# Patient Record
Sex: Male | Born: 1940 | Race: White | Hispanic: No | Marital: Married | Smoking: Former smoker
Health system: Southern US, Community
[De-identification: ages and names within clinical notes are randomized; demographics above are authoritative.]

## PROBLEM LIST (undated history)

## (undated) DIAGNOSIS — M1990 Unspecified osteoarthritis, unspecified site: Secondary | ICD-10-CM

## (undated) DIAGNOSIS — I219 Acute myocardial infarction, unspecified: Secondary | ICD-10-CM

## (undated) DIAGNOSIS — M549 Dorsalgia, unspecified: Secondary | ICD-10-CM

## (undated) DIAGNOSIS — R233 Spontaneous ecchymoses: Secondary | ICD-10-CM

## (undated) DIAGNOSIS — IMO0001 Reserved for inherently not codable concepts without codable children: Secondary | ICD-10-CM

## (undated) DIAGNOSIS — C801 Malignant (primary) neoplasm, unspecified: Secondary | ICD-10-CM

## (undated) DIAGNOSIS — K219 Gastro-esophageal reflux disease without esophagitis: Secondary | ICD-10-CM

## (undated) DIAGNOSIS — Z5189 Encounter for other specified aftercare: Secondary | ICD-10-CM

## (undated) DIAGNOSIS — G8929 Other chronic pain: Secondary | ICD-10-CM

## (undated) DIAGNOSIS — H3530 Unspecified macular degeneration: Secondary | ICD-10-CM

## (undated) DIAGNOSIS — R238 Other skin changes: Secondary | ICD-10-CM

## (undated) DIAGNOSIS — H9190 Unspecified hearing loss, unspecified ear: Secondary | ICD-10-CM

## (undated) DIAGNOSIS — J189 Pneumonia, unspecified organism: Secondary | ICD-10-CM

## (undated) DIAGNOSIS — I2510 Atherosclerotic heart disease of native coronary artery without angina pectoris: Secondary | ICD-10-CM

## (undated) HISTORY — PX: CATARACT EXTRACTION: SUR2

## (undated) HISTORY — PX: JOINT REPLACEMENT: SHX530

## (undated) HISTORY — PX: EYE SURGERY: SHX253

## (undated) HISTORY — PX: TONSILLECTOMY: SUR1361

## (undated) HISTORY — PX: BACK SURGERY: SHX140

## (undated) HISTORY — PX: COLONOSCOPY: SHX174

---

## 1998-01-09 DIAGNOSIS — J189 Pneumonia, unspecified organism: Secondary | ICD-10-CM

## 1998-01-09 HISTORY — DX: Pneumonia, unspecified organism: J18.9

## 1999-01-10 DIAGNOSIS — IMO0001 Reserved for inherently not codable concepts without codable children: Secondary | ICD-10-CM

## 1999-01-10 DIAGNOSIS — Z5189 Encounter for other specified aftercare: Secondary | ICD-10-CM

## 1999-01-10 DIAGNOSIS — I219 Acute myocardial infarction, unspecified: Secondary | ICD-10-CM

## 1999-01-10 HISTORY — DX: Reserved for inherently not codable concepts without codable children: IMO0001

## 1999-01-10 HISTORY — PX: CARDIAC CATHETERIZATION: SHX172

## 1999-01-10 HISTORY — DX: Acute myocardial infarction, unspecified: I21.9

## 1999-01-10 HISTORY — PX: CORONARY ARTERY BYPASS GRAFT: SHX141

## 1999-01-10 HISTORY — DX: Encounter for other specified aftercare: Z51.89

## 2000-04-17 ENCOUNTER — Inpatient Hospital Stay (HOSPITAL_COMMUNITY): Admission: EM | Admit: 2000-04-17 | Discharge: 2000-04-23 | Payer: Self-pay

## 2000-04-18 ENCOUNTER — Encounter: Payer: Self-pay | Admitting: Thoracic Surgery (Cardiothoracic Vascular Surgery)

## 2000-04-19 ENCOUNTER — Encounter: Payer: Self-pay | Admitting: Thoracic Surgery (Cardiothoracic Vascular Surgery)

## 2000-04-20 ENCOUNTER — Encounter: Payer: Self-pay | Admitting: Thoracic Surgery (Cardiothoracic Vascular Surgery)

## 2000-04-21 ENCOUNTER — Encounter: Payer: Self-pay | Admitting: Thoracic Surgery (Cardiothoracic Vascular Surgery)

## 2004-01-25 ENCOUNTER — Inpatient Hospital Stay: Payer: Self-pay | Admitting: Otolaryngology

## 2005-03-30 ENCOUNTER — Other Ambulatory Visit: Payer: Self-pay

## 2005-03-30 ENCOUNTER — Inpatient Hospital Stay: Payer: Self-pay

## 2006-01-26 ENCOUNTER — Ambulatory Visit: Payer: Self-pay | Admitting: General Surgery

## 2009-10-13 ENCOUNTER — Ambulatory Visit: Payer: Self-pay | Admitting: Ophthalmology

## 2009-12-08 ENCOUNTER — Ambulatory Visit: Payer: Self-pay | Admitting: Ophthalmology

## 2010-05-05 ENCOUNTER — Ambulatory Visit: Payer: Self-pay | Admitting: Internal Medicine

## 2010-11-17 ENCOUNTER — Ambulatory Visit: Payer: Self-pay | Admitting: Neurosurgery

## 2010-12-14 ENCOUNTER — Encounter (HOSPITAL_COMMUNITY): Payer: Self-pay | Admitting: Pharmacy Technician

## 2010-12-15 ENCOUNTER — Encounter (HOSPITAL_COMMUNITY): Payer: Self-pay

## 2010-12-15 ENCOUNTER — Encounter (HOSPITAL_COMMUNITY)
Admission: RE | Admit: 2010-12-15 | Discharge: 2010-12-15 | Disposition: A | Discharge status: Home / Self Care | Payer: Medicare Other | Source: Ambulatory Visit | Attending: Neurosurgery | Admitting: Neurosurgery

## 2010-12-15 HISTORY — DX: Pneumonia, unspecified organism: J18.9

## 2010-12-15 HISTORY — DX: Spontaneous ecchymoses: R23.3

## 2010-12-15 HISTORY — DX: Encounter for other specified aftercare: Z51.89

## 2010-12-15 HISTORY — DX: Acute myocardial infarction, unspecified: I21.9

## 2010-12-15 HISTORY — DX: Gastro-esophageal reflux disease without esophagitis: K21.9

## 2010-12-15 HISTORY — DX: Other chronic pain: G89.29

## 2010-12-15 HISTORY — DX: Dorsalgia, unspecified: M54.9

## 2010-12-15 HISTORY — DX: Unspecified osteoarthritis, unspecified site: M19.90

## 2010-12-15 HISTORY — DX: Other skin changes: R23.8

## 2010-12-15 HISTORY — DX: Unspecified macular degeneration: H35.30

## 2010-12-15 HISTORY — DX: Unspecified hearing loss, unspecified ear: H91.90

## 2010-12-15 HISTORY — DX: Atherosclerotic heart disease of native coronary artery without angina pectoris: I25.10

## 2010-12-15 HISTORY — DX: Reserved for inherently not codable concepts without codable children: IMO0001

## 2010-12-15 LAB — MRSA PCR SCREENING: MRSA by PCR: NEGATIVE

## 2010-12-15 LAB — CBC
Hemoglobin: 13.5 g/dL (ref 13.0–17.0)
MCH: 29.5 pg (ref 26.0–34.0)
RBC: 4.57 MIL/uL (ref 4.22–5.81)
WBC: 7.1 10*3/uL (ref 4.0–10.5)

## 2010-12-15 LAB — BASIC METABOLIC PANEL
GFR calc Af Amer: 70 mL/min — ABNORMAL LOW (ref >90)
GFR calc non Af Amer: 60 mL/min — ABNORMAL LOW (ref >90)
Glucose, Bld: 145 mg/dL — ABNORMAL HIGH (ref 70–99)
Potassium: 4.8 mEq/L (ref 3.5–5.1)
Sodium: 138 mEq/L (ref 135–145)

## 2010-12-15 NOTE — Progress Notes (Signed)
Pt is on Simvastatin and Metoprolol as maintanence  Only  Cardiologist is Dr.Paraschos and a clearance note is in chart;echo,stress test,ekg done-to request this

## 2010-12-15 NOTE — Pre-Procedure Instructions (Signed)
[address]  12/15/2010   Your procedure is scheduled on: Mon,Dec 10 @ 0730  Report to [facility] at 0530 AM.  Call this number if you have problems the morning of surgery: [phone]   Remember:   Do not eat food:After Midnight.  May have clear liquids: up to 4 Hours before arrival.(until 1:30am)  Clear liquids include soda, tea, black coffee, apple or grape juice, broth.  Take these medicines the morning of surgery with A SIP OF WATER: Metoprolol,Omeprazole,Pain Pill(if needed),Tramadol   Do not wear jewelry, make-up or nail polish.  Do not wear lotions, powders, or perfumes. You may wear deodorant.  Do not shave 48 hours prior to surgery.  Do not bring valuables to the hospital.  Contacts, dentures or bridgework may not be worn into surgery.  Leave suitcase in the car. After surgery it may be brought to your room.  For patients admitted to the hospital, checkout time is 11:00 AM the day of discharge.   Patients discharged the day of surgery will not be allowed to drive home.  Name and phone number of your driver:   Special Instructions: CHG Shower Use Special Wash: 1/2 bottle night before surgery and 1/2 bottle morning of surgery.   Please read over the following fact sheets that you were given: Pain Booklet, Coughing and Deep Breathing, MRSA Information and Surgical Site Infection Prevention

## 2010-12-19 ENCOUNTER — Inpatient Hospital Stay (HOSPITAL_COMMUNITY): Payer: Medicare Other

## 2010-12-19 ENCOUNTER — Encounter (HOSPITAL_COMMUNITY)
Admission: RE | Disposition: A | Discharge status: Home / Self Care | Payer: Self-pay | Source: Ambulatory Visit | Attending: Neurosurgery

## 2010-12-19 ENCOUNTER — Inpatient Hospital Stay (HOSPITAL_COMMUNITY): Payer: Medicare Other | Admitting: Anesthesiology

## 2010-12-19 ENCOUNTER — Encounter (HOSPITAL_COMMUNITY): Payer: Self-pay | Admitting: Anesthesiology

## 2010-12-19 ENCOUNTER — Encounter (HOSPITAL_COMMUNITY): Payer: Self-pay | Admitting: *Deleted

## 2010-12-19 ENCOUNTER — Inpatient Hospital Stay (HOSPITAL_COMMUNITY)
Admission: RE | Admit: 2010-12-19 | Discharge: 2010-12-20 | DRG: 491 | Disposition: A | Discharge status: Home / Self Care | Payer: Medicare Other | Source: Ambulatory Visit | Attending: Neurosurgery | Admitting: Neurosurgery

## 2010-12-19 DIAGNOSIS — I2510 Atherosclerotic heart disease of native coronary artery without angina pectoris: Secondary | ICD-10-CM | POA: Diagnosis present

## 2010-12-19 DIAGNOSIS — I252 Old myocardial infarction: Secondary | ICD-10-CM

## 2010-12-19 DIAGNOSIS — E119 Type 2 diabetes mellitus without complications: Secondary | ICD-10-CM | POA: Diagnosis present

## 2010-12-19 DIAGNOSIS — M48062 Spinal stenosis, lumbar region with neurogenic claudication: Principal | ICD-10-CM | POA: Diagnosis present

## 2010-12-19 DIAGNOSIS — M5126 Other intervertebral disc displacement, lumbar region: Secondary | ICD-10-CM | POA: Diagnosis present

## 2010-12-19 DIAGNOSIS — K219 Gastro-esophageal reflux disease without esophagitis: Secondary | ICD-10-CM | POA: Diagnosis present

## 2010-12-19 HISTORY — PX: LUMBAR LAMINECTOMY/DECOMPRESSION MICRODISCECTOMY: SHX5026

## 2010-12-19 LAB — GLUCOSE, CAPILLARY
Glucose-Capillary: 132 mg/dL — ABNORMAL HIGH (ref 70–99)
Glucose-Capillary: 128 mg/dL — ABNORMAL HIGH (ref 70–99)
Glucose-Capillary: 180 mg/dL — ABNORMAL HIGH (ref 70–99)

## 2010-12-19 SURGERY — LUMBAR LAMINECTOMY/DECOMPRESSION MICRODISCECTOMY
Anesthesia: General | Site: Back | Wound class: Clean

## 2010-12-19 MED ORDER — SODIUM CHLORIDE 0.9 % IR SOLN
Status: DC | PRN
  Administered 2010-12-19: 1000 mL

## 2010-12-19 MED ORDER — LIDOCAINE-EPINEPHRINE 1 %-1:100000 IJ SOLN
INTRAMUSCULAR | Status: DC | PRN
  Administered 2010-12-19: 20 mL

## 2010-12-19 MED ORDER — NAPROXEN 500 MG PO TABS
500.0000 mg | ORAL_TABLET | Freq: Two times a day (BID) | ORAL | Status: DC
  Administered 2010-12-19 – 2010-12-20 (×2): 500 mg via ORAL
  Filled 2010-12-19 (×5): qty 1

## 2010-12-19 MED ORDER — ACETAMINOPHEN 325 MG PO TABS: 650.0000 mg | ORAL_TABLET | ORAL | Status: DC | PRN

## 2010-12-19 MED ORDER — MENTHOL 3 MG MT LOZG: 1.0000 | LOZENGE | OROMUCOSAL | Status: DC | PRN

## 2010-12-19 MED ORDER — SODIUM CHLORIDE 0.9 % IR SOLN
Status: DC | PRN
  Administered 2010-12-19: 08:00:00

## 2010-12-19 MED ORDER — MAGNESIUM OXIDE 400 MG PO TABS: 400.0000 mg | ORAL_TABLET | Freq: Every day | ORAL | Status: DC

## 2010-12-19 MED ORDER — LIDOCAINE HCL (CARDIAC) 20 MG/ML IV SOLN
INTRAVENOUS | Status: DC | PRN
  Administered 2010-12-19: 60 mg via INTRAVENOUS

## 2010-12-19 MED ORDER — ONDANSETRON HCL 4 MG/2ML IJ SOLN
INTRAMUSCULAR | Status: DC | PRN
  Administered 2010-12-19: 4 mg via INTRAVENOUS

## 2010-12-19 MED ORDER — CEFAZOLIN SODIUM 1-5 GM-% IV SOLN
INTRAVENOUS | Status: DC | PRN
  Administered 2010-12-19: 2 g via INTRAVENOUS

## 2010-12-19 MED ORDER — PRESERVISION AREDS 2 PO CAPS: 2.0000 | ORAL_CAPSULE | Freq: Every day | ORAL | Status: DC

## 2010-12-19 MED ORDER — INSULIN ASPART 100 UNIT/ML ~~LOC~~ SOLN: 0.0000 [IU] | Freq: Every day | SUBCUTANEOUS | Status: DC

## 2010-12-19 MED ORDER — METOPROLOL TARTRATE 12.5 MG HALF TABLET
12.5000 mg | ORAL_TABLET | Freq: Two times a day (BID) | ORAL | Status: DC
Start: 2010-12-19 — End: 2010-12-20
  Administered 2010-12-19 – 2010-12-20 (×2): 12.5 mg via ORAL
  Filled 2010-12-19 (×3): qty 1

## 2010-12-19 MED ORDER — ASPIRIN EC 81 MG PO TBEC
81.0000 mg | DELAYED_RELEASE_TABLET | Freq: Every day | ORAL | Status: DC
  Administered 2010-12-19: 81 mg via ORAL
  Filled 2010-12-19 (×2): qty 1

## 2010-12-19 MED ORDER — ROCURONIUM BROMIDE 100 MG/10ML IV SOLN
INTRAVENOUS | Status: DC | PRN
  Administered 2010-12-19: 50 mg via INTRAVENOUS
  Administered 2010-12-19: 30 mg via INTRAVENOUS

## 2010-12-19 MED ORDER — PANTOPRAZOLE SODIUM 40 MG IV SOLR: 40.0000 mg | Freq: Every day | INTRAVENOUS | Status: DC

## 2010-12-19 MED ORDER — SIMVASTATIN 40 MG PO TABS
40.0000 mg | ORAL_TABLET | Freq: Every day | ORAL | Status: DC
  Administered 2010-12-19: 40 mg via ORAL
  Filled 2010-12-19 (×2): qty 1

## 2010-12-19 MED ORDER — INSULIN ASPART 100 UNIT/ML ~~LOC~~ SOLN
0.0000 [IU] | Freq: Three times a day (TID) | SUBCUTANEOUS | Status: DC
  Administered 2010-12-20: 2 [IU] via SUBCUTANEOUS
  Filled 2010-12-19: qty 3

## 2010-12-19 MED ORDER — PANTOPRAZOLE SODIUM 40 MG PO TBEC
40.0000 mg | DELAYED_RELEASE_TABLET | Freq: Every day | ORAL | Status: DC
  Administered 2010-12-20: 40 mg via ORAL
  Filled 2010-12-19: qty 1

## 2010-12-19 MED ORDER — HYDROMORPHONE HCL PF 1 MG/ML IJ SOLN: 0.2500 mg | INTRAMUSCULAR | Status: DC | PRN

## 2010-12-19 MED ORDER — TRAMADOL HCL 50 MG PO TABS
50.0000 mg | ORAL_TABLET | Freq: Four times a day (QID) | ORAL | Status: DC | PRN
  Filled 2010-12-19: qty 1

## 2010-12-19 MED ORDER — BUPIVACAINE HCL (PF) 0.25 % IJ SOLN
INTRAMUSCULAR | Status: DC | PRN
  Administered 2010-12-19: 50 mL

## 2010-12-19 MED ORDER — ONDANSETRON HCL 4 MG/2ML IJ SOLN: 4.0000 mg | INTRAMUSCULAR | Status: DC | PRN

## 2010-12-19 MED ORDER — PHENYLEPHRINE HCL 10 MG/ML IJ SOLN
INTRAMUSCULAR | Status: DC | PRN
  Administered 2010-12-19 (×3): 80 ug via INTRAVENOUS

## 2010-12-19 MED ORDER — PROPOFOL 10 MG/ML IV EMUL
INTRAVENOUS | Status: DC | PRN
  Administered 2010-12-19: 180 mg via INTRAVENOUS

## 2010-12-19 MED ORDER — HYDROMORPHONE HCL PF 1 MG/ML IJ SOLN
0.5000 mg | INTRAMUSCULAR | Status: DC | PRN
  Administered 2010-12-19: 1 mg via INTRAVENOUS
  Filled 2010-12-19: qty 1

## 2010-12-19 MED ORDER — CYCLOBENZAPRINE HCL 10 MG PO TABS
10.0000 mg | ORAL_TABLET | Freq: Three times a day (TID) | ORAL | Status: DC | PRN
  Administered 2010-12-19: 10 mg via ORAL
  Filled 2010-12-19: qty 1

## 2010-12-19 MED ORDER — OXYCODONE-ACETAMINOPHEN 5-325 MG PO TABS
1.0000 | ORAL_TABLET | Freq: Four times a day (QID) | ORAL | Status: DC | PRN
  Administered 2010-12-19 – 2010-12-20 (×2): 1 via ORAL
  Filled 2010-12-19 (×2): qty 1

## 2010-12-19 MED ORDER — LACTATED RINGERS IV SOLN
INTRAVENOUS | Status: DC | PRN
  Administered 2010-12-19 (×2): via INTRAVENOUS

## 2010-12-19 MED ORDER — OMEGA-3 FATTY ACIDS 1000 MG PO CAPS
1.0000 g | ORAL_CAPSULE | Freq: Every day | ORAL | Status: DC
  Administered 2010-12-20: 1 g via ORAL
  Filled 2010-12-19: qty 1

## 2010-12-19 MED ORDER — EPHEDRINE SULFATE 50 MG/ML IJ SOLN
INTRAMUSCULAR | Status: DC | PRN
  Administered 2010-12-19 (×2): 10 mg via INTRAVENOUS

## 2010-12-19 MED ORDER — PROSIGHT PO TABS
1.0000 | ORAL_TABLET | Freq: Two times a day (BID) | ORAL | Status: DC
  Administered 2010-12-19 – 2010-12-20 (×2): 1 via ORAL
  Filled 2010-12-19 (×3): qty 1

## 2010-12-19 MED ORDER — PHENOL 1.4 % MT LIQD: 1.0000 | OROMUCOSAL | Status: DC | PRN

## 2010-12-19 MED ORDER — FENTANYL CITRATE 0.05 MG/ML IJ SOLN
INTRAMUSCULAR | Status: DC | PRN
  Administered 2010-12-19: 75 ug via INTRAVENOUS
  Administered 2010-12-19: 100 ug via INTRAVENOUS

## 2010-12-19 MED ORDER — HEMOSTATIC AGENTS (NO CHARGE) OPTIME
TOPICAL | Status: DC | PRN
  Administered 2010-12-19: 1 via TOPICAL

## 2010-12-19 MED ORDER — PROSIGHT PO TABS: 2.0000 | ORAL_TABLET | Freq: Every day | ORAL | Status: DC

## 2010-12-19 MED ORDER — GLYCOPYRROLATE 0.2 MG/ML IJ SOLN
INTRAMUSCULAR | Status: DC | PRN
  Administered 2010-12-19: .8 mg via INTRAVENOUS

## 2010-12-19 MED ORDER — ONDANSETRON HCL 4 MG/2ML IJ SOLN: 4.0000 mg | Freq: Four times a day (QID) | INTRAMUSCULAR | Status: DC | PRN

## 2010-12-19 MED ORDER — CEFAZOLIN SODIUM 1-5 GM-% IV SOLN
1.0000 g | Freq: Three times a day (TID) | INTRAVENOUS | Status: AC
  Administered 2010-12-19 (×2): 1 g via INTRAVENOUS
  Filled 2010-12-19 (×2): qty 50

## 2010-12-19 MED ORDER — METFORMIN HCL 500 MG PO TABS
500.0000 mg | ORAL_TABLET | Freq: Every day | ORAL | Status: DC
  Administered 2010-12-20: 500 mg via ORAL
  Filled 2010-12-19 (×2): qty 1

## 2010-12-19 MED ORDER — ASPIRIN EC 81 MG PO TBEC: 81.0000 mg | DELAYED_RELEASE_TABLET | Freq: Every day | ORAL | Status: DC

## 2010-12-19 MED ORDER — MAGNESIUM OXIDE 400 MG PO TABS
400.0000 mg | ORAL_TABLET | Freq: Every day | ORAL | Status: DC
  Administered 2010-12-19: 400 mg via ORAL
  Filled 2010-12-19 (×2): qty 1

## 2010-12-19 MED ORDER — ACETAMINOPHEN 650 MG RE SUPP: 650.0000 mg | RECTAL | Status: DC | PRN

## 2010-12-19 MED ORDER — SODIUM CHLORIDE 0.9 % IJ SOLN
3.0000 mL | Freq: Two times a day (BID) | INTRAMUSCULAR | Status: DC
  Administered 2010-12-19 (×2): 3 mL via INTRAVENOUS

## 2010-12-19 MED ORDER — NEOSTIGMINE METHYLSULFATE 1 MG/ML IJ SOLN
INTRAMUSCULAR | Status: DC | PRN
  Administered 2010-12-19: 5 mg via INTRAVENOUS

## 2010-12-19 MED ORDER — THROMBIN 5000 UNITS EX KIT
PACK | CUTANEOUS | Status: DC | PRN
  Administered 2010-12-19 (×2): 5000 [IU] via TOPICAL

## 2010-12-19 SURGICAL SUPPLY — 57 items
BAG DECANTER FOR FLEXI CONT (MISCELLANEOUS) ×2 IMPLANT
BENZOIN TINCTURE PRP APPL 2/3 (GAUZE/BANDAGES/DRESSINGS) ×2 IMPLANT
BLADE SURG 11 STRL SS (BLADE) ×2 IMPLANT
BLADE SURG ROTATE 9660 (MISCELLANEOUS) IMPLANT
BRUSH SCRUB EZ PLAIN DRY (MISCELLANEOUS) ×2 IMPLANT
BUR MATCHSTICK NEURO 3.0 LAGG (BURR) ×2 IMPLANT
BUR PRECISION FLUTE 6.0 (BURR) ×2 IMPLANT
CANISTER SUCTION 2500CC (MISCELLANEOUS) ×2 IMPLANT
CLOTH BEACON ORANGE TIMEOUT ST (SAFETY) ×2 IMPLANT
CONT SPEC 4OZ CLIKSEAL STRL BL (MISCELLANEOUS) ×4 IMPLANT
CORDS BIPOLAR (ELECTRODE) ×2 IMPLANT
DECANTER SPIKE VIAL GLASS SM (MISCELLANEOUS) IMPLANT
DERMABOND ADVANCED (GAUZE/BANDAGES/DRESSINGS) ×1
DERMABOND ADVANCED .7 DNX12 (GAUZE/BANDAGES/DRESSINGS) ×1 IMPLANT
DRAPE LAPAROTOMY 100X72X124 (DRAPES) ×2 IMPLANT
DRAPE MICROSCOPE ZEISS OPMI (DRAPES) ×2 IMPLANT
DRAPE POUCH INSTRU U-SHP 10X18 (DRAPES) ×2 IMPLANT
DRAPE PROXIMA HALF (DRAPES) IMPLANT
DRAPE SURG 17X23 STRL (DRAPES) ×2 IMPLANT
DRSG OPSITE 4X5.5 SM (GAUZE/BANDAGES/DRESSINGS) ×2 IMPLANT
ELECT REM PT RETURN 9FT ADLT (ELECTROSURGICAL) ×2
ELECTRODE REM PT RTRN 9FT ADLT (ELECTROSURGICAL) ×1 IMPLANT
EVACUATOR 1/8 PVC DRAIN (DRAIN) ×2 IMPLANT
GAUZE SPONGE 4X4 16PLY XRAY LF (GAUZE/BANDAGES/DRESSINGS) IMPLANT
GLOVE BIO SURGEON STRL SZ8 (GLOVE) ×2 IMPLANT
GLOVE BIOGEL PI IND STRL 7.0 (GLOVE) ×1 IMPLANT
GLOVE BIOGEL PI INDICATOR 7.0 (GLOVE) ×1
GLOVE ECLIPSE 7.5 STRL STRAW (GLOVE) IMPLANT
GLOVE ECLIPSE 8.5 STRL (GLOVE) ×2 IMPLANT
GLOVE EXAM NITRILE LRG STRL (GLOVE) ×2 IMPLANT
GLOVE EXAM NITRILE MD LF STRL (GLOVE) IMPLANT
GLOVE EXAM NITRILE XL STR (GLOVE) IMPLANT
GLOVE EXAM NITRILE XS STR PU (GLOVE) IMPLANT
GLOVE INDICATOR 8.5 STRL (GLOVE) ×2 IMPLANT
GLOVE SURG SS PI 6.5 STRL IVOR (GLOVE) ×4 IMPLANT
GOWN BRE IMP SLV AUR LG STRL (GOWN DISPOSABLE) ×2 IMPLANT
GOWN BRE IMP SLV AUR XL STRL (GOWN DISPOSABLE) ×4 IMPLANT
GOWN STRL REIN 2XL LVL4 (GOWN DISPOSABLE) IMPLANT
KIT BASIN OR (CUSTOM PROCEDURE TRAY) ×2 IMPLANT
KIT ROOM TURNOVER OR (KITS) ×2 IMPLANT
NEEDLE HYPO 22GX1.5 SAFETY (NEEDLE) ×2 IMPLANT
NEEDLE SPNL 22GX3.5 QUINCKE BK (NEEDLE) ×2 IMPLANT
NS IRRIG 1000ML POUR BTL (IV SOLUTION) ×2 IMPLANT
PACK LAMINECTOMY NEURO (CUSTOM PROCEDURE TRAY) ×2 IMPLANT
RUBBERBAND STERILE (MISCELLANEOUS) ×4 IMPLANT
SPONGE GAUZE 4X4 12PLY (GAUZE/BANDAGES/DRESSINGS) ×2 IMPLANT
SPONGE SURGIFOAM ABS GEL SZ50 (HEMOSTASIS) ×2 IMPLANT
STRIP CLOSURE SKIN 1/2X4 (GAUZE/BANDAGES/DRESSINGS) ×2 IMPLANT
SUT VIC AB 0 CT1 18XCR BRD8 (SUTURE) ×1 IMPLANT
SUT VIC AB 0 CT1 8-18 (SUTURE) ×1
SUT VIC AB 2-0 CT1 18 (SUTURE) ×2 IMPLANT
SUT VICRYL 4-0 PS2 18IN ABS (SUTURE) ×2 IMPLANT
SYR 20ML ECCENTRIC (SYRINGE) ×2 IMPLANT
TAPE CLOTH SURG 4X10 WHT LF (GAUZE/BANDAGES/DRESSINGS) ×2 IMPLANT
TOWEL OR 17X24 6PK STRL BLUE (TOWEL DISPOSABLE) ×2 IMPLANT
TOWEL OR 17X26 10 PK STRL BLUE (TOWEL DISPOSABLE) ×2 IMPLANT
WATER STERILE IRR 1000ML POUR (IV SOLUTION) ×2 IMPLANT

## 2010-12-19 NOTE — Anesthesia Preprocedure Evaluation (Addendum)
Anesthesia Evaluation  Patient identified by MRN, date of birth, ID band Patient awake    Reviewed: Allergy & Precautions, H&P , NPO status , Patient's Chart, lab work & pertinent test results  Airway   Neck ROM: full    Dental  (+) Edentulous Upper   Pulmonary  clear to auscultation        Cardiovascular + CAD and + Past MI     Neuro/Psych    GI/Hepatic GERD-  ,  Endo/Other  Diabetes mellitus-  Renal/GU      Musculoskeletal   Abdominal   Peds  Hematology   Anesthesia Other Findings   Reproductive/Obstetrics                          Anesthesia Physical Anesthesia Plan  ASA: III  Anesthesia Plan: General   Post-op Pain Management:    Induction: Intravenous  Airway Management Planned: Oral ETT  Additional Equipment:   Intra-op Plan:   Post-operative Plan:   Informed Consent: I have reviewed the patients History and Physical, chart, labs and discussed the procedure including the risks, benefits and alternatives for the proposed anesthesia with the patient or authorized representative who has indicated his/her understanding and acceptance.     Plan Discussed with: CRNA and Surgeon  Anesthesia Plan Comments:         Anesthesia Quick Evaluation

## 2010-12-19 NOTE — Transfer of Care (Signed)
Immediate Anesthesia Transfer of Care Note  Patient: Ivan Beasley  Procedure(s) Performed:  LUMBAR LAMINECTOMY/DECOMPRESSION MICRODISCECTOMY - Lumbar three-four, four-five decompressive lumbar laminectomy  Patient Location: PACU  Anesthesia Type: General  Level of Consciousness: awake, alert  and oriented  Airway & Oxygen Therapy: Patient Spontanous Breathing and Patient connected to nasal cannula oxygen  Post-op Assessment: Report given to PACU RN and Post -op Vital signs reviewed and stable  Post vital signs: Reviewed and stable  Complications: No apparent anesthesia complications

## 2010-12-19 NOTE — Preoperative (Signed)
Beta Blockers   Reason not to administer Beta Blockers:Not Applicable 

## 2010-12-19 NOTE — H&P (Signed)
Ivan Beasley is an 70 y.o. male.   Chief Complaint: Back and bilateral leg pain HPI: This is a 70 year old gentleman with long-standing back and bilateral leg pain worse on the right that limits his walking. He claudication after very short distances must stop and rest before he resumes. He reports numbness and tingling that maintain that would radiate to his right buttock down the back and outside of his right thigh the front of his shin and L4-L5 nerve root pattern he is been refractory to all forms of conservative treatment to include medications, steroids, therapy and time. He was preoperatively cleared by the [facility] in his cardiologist which noted a ejection fraction of 55-60% on his last echo. Patient currently denies any bowel bladder difficulty. And no new symptoms.  Past Medical History  Diagnosis Date  . Coronary artery disease   . Myocardial infarction 2001  . Pneumonia 2000  . Arthritis   . 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 Bruises easily   . GERD (gastroesophageal reflux disease)     takes Omeprazole daily as needed for stomach pain  . Blood transfusion 2001  . Diabetes mellitus     takes Metformin daily;  . Impaired hearing     bil hearing aide  . Macular degeneration     being watched for this but hasn't been "completely" diagnosed    Past Surgical History  Procedure Date  . Coronary artery bypass graft 2001    4 vessels  . Cardiac catheterization 2001  . Tonsillectomy     as a child  . Colonoscopy   . Cataract extraction     bilateral    Family History  Problem Relation Age of Onset  . Anesthesia problems Neg Hx   . Hypotension Neg Hx   . Malignant hyperthermia Neg Hx   . Pseudochol deficiency Neg Hx    Social History:  reports that he has quit smoking. He does not have any smokeless tobacco history on file. He reports that he does not drink alcohol or use illicit drugs.  Allergies: No Known Allergies  Medications Prior to  Admission  Medication Dose Route Frequency Provider Last Rate Last Dose  . HYDROmorphone (DILAUDID) injection 0.25-0.5 mg  0.25-0.5 mg Intravenous Q5 min PRN Raiford Simmonds, MD      . ondansetron [facility]) injection 4 mg  4 mg Intravenous Q6H PRN Raiford Simmonds, MD       No current outpatient prescriptions on file as of 12/19/2010.    Results for orders placed during the hospital encounter of 12/19/10 (from the past 48 hour(s))  GLUCOSE, CAPILLARY     Status: Abnormal   Collection Time   12/19/10  6:25 AM      Component Value Range Comment   Glucose-Capillary 132 (*) 70 - 99 (mg/dL)    Dg Chest 2 View  78/29/5621  *RADIOLOGY REPORT*  Clinical Data: Preop lumbar surgery.  CHEST - 2 VIEW  Comparison: None.  Findings: Postoperative changes in the mediastinum with sternotomy wires and vascular markers and surgical clips present.  Fractures of the upper for sternotomy wires noted.  Normal heart size and pulmonary vascularity.  Hyperinflation suggesting emphysema. Slight fibrosis in the lung bases.  No focal airspace consolidation.  No blunting of costophrenic angles.  No pneumothorax.  IMPRESSION: No evidence of active pulmonary disease.  Original Report Authenticated By: Marlon Pel, M.D.    Review of Systems  Constitutional: Negative.  Eyes: Negative.   Respiratory: Negative.   Cardiovascular: Negative.   Gastrointestinal: Negative.   Musculoskeletal: Positive for back pain.  Skin: Negative.   Endo/Heme/Allergies: Bruises/bleeds easily.    Blood pressure 129/76, pulse 93, temperature 98 F (36.7 C), temperature source Oral, resp. rate 18, SpO2 95.00%. Physical Exam  Constitutional: He is oriented to person, place, and time. He appears well-developed.  HENT:  Head: Normocephalic.  Eyes: Pupils are equal, round, and reactive to light.  Neck: Normal range of motion.  GI: Soft.  Neurological: He is alert and oriented to person, place, and time.       Patient is awake alert  oriented x4 cranial nerves are intact, strength in his lower extremities are 5 out of 5 in his iliopsoas, quads, hamstrings, gastrocs, anterior tibialis, EHL. Sensation grossly intact.     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 Risks and benefits of the operation were explained to the patient as well as alternatives of surgery expectations of outcome perioperative course, he understands and agrees to proceed forward.  Ivan Beasley P 12/19/2010, 7:20 AM

## 2010-12-19 NOTE — Op Note (Signed)
Preoperative diagnosis: Lumbar spinal stenosis L3-4 L4-5 with herniated nucleus pulposus L4-5 right  Postoperative diagnosis: Same  Procedure: Decompressive lumbar laminectomies bilateral at L3-4 and L4-5, microscopic discectomy L4-5 right with microdissection of the right L5 nerve root microscopic discectomy  Surgeon: Jillyn Hidden Ashtin Rosner  Assistant: Julio Sicks  Anesthesia: Gen.  EBL: 100  History of present illness: Patient is a very pleasant 70 year old gentleman who presents with neurogenic claudication from severe lumbar spinal stenosis at L3-4 and L4-5 with a herniated nucleus pulposus at L4-5 right. Patient failed all forms of conservative treatment have progressive worsening pain numbness and tingling and weakness in his legs and imaging studies that showed critical stenosis at L4-5 with a herniated disc as well as lumbar spinal stenosis L3-4. Patient was recommended a decompressive laminectomies and discectomy L4-5 in the right. Risks and benefits of the operation, expectations of outcome, alternatives to surgery, perioperative course relatively the patient and his family they understand and agree to proceed forward.  Operative procedure: Patient was brought in the or was induced under general anesthesia and positioned prone the Wilson frame and his back was prepped and draped in routine sterile fashion. After infiltration 10 cc lidocaine with epi a midline incision was made and Bovie light cautery was used to take down the subcutaneous tissues. Subperiosteal dissections care on the lamina of L45 and L3 bilaterally. Interoperative X. identify the appropriate level, at this point the spinous processes at L3 and L4 as well as part of the spinous process of 5 were removed central decompression was begun. There was marked hourglass compression of thecal sac predominantly from facet arthropathy and ligamentous hypertrophy at both L3-4 and L4-5. The plane from the leg and it was developed from the dura and  removed in piecemeal fashion with him a Kerrison punch. The central decompression been completed, marking laterally undergoing the facet complexes lateral decompression was achieved and the L4 and L5 foramen bilaterally were decompressed. After this had been achieved I bilaterally he OptiMARK substrate carotid field and the microscope illumination the disc space at L4-5 on the right it was herniated still partially contained within the annulus and after the epidural veins were quite dilated, an 11 blade was used to make the annulotomy and several are/discharge medial express. The disc spaces and cleanout pituitary rongeurs Epstein curettes several additional fragments removed the central compartment as well as lateral compartment and again the discectomy there is no further stenosis was a slack and the undersurface of the medial dural easily accepted probing by coronary.and hockey-stick. Although foramina were then reinspected with a coronary.h[employer] to confirm patency of the L3-4 and 5 neuroforamen. The wounds and copiously irrigated meticulous hemostasis was maintained Gelfoam was overlaid top of the dura a medium neck drain was placed and the wounds closed in layers with interrupted Vicryl. The skin was then closed with a running 4-0 subcuticular, benzoin and Steri-Strips were applied as well as Dermabond and the wound was dressed the patient recovered in stable condition at the end of the case all needle counts sponge counts were correct.

## 2010-12-19 NOTE — Anesthesia Postprocedure Evaluation (Signed)
Anesthesia Post Note  Patient: Ivan Beasley  Procedure(s) Performed:  LUMBAR LAMINECTOMY/DECOMPRESSION MICRODISCECTOMY - Lumbar three-four, four-five decompressive lumbar laminectomy  Anesthesia type: General  Patient location: PACU  Post pain: Pain level controlled and Adequate analgesia  Post assessment: Post-op Vital signs reviewed, Patient's Cardiovascular Status Stable, Respiratory Function Stable, Patent Airway and Pain level controlled  Last Vitals:  Filed Vitals:   12/19/10 1600  BP: 154/71  Pulse: 84  Temp: 36.5 C  Resp: 18    Post vital signs: Reviewed and stable  Level of consciousness: awake, alert  and oriented  Complications: No apparent anesthesia complications

## 2010-12-20 LAB — GLUCOSE, CAPILLARY: Glucose-Capillary: 144 mg/dL — ABNORMAL HIGH (ref 70–99)

## 2010-12-20 NOTE — Discharge Summary (Signed)
  Physician Discharge Summary  Patient ID: Ivan Beasley MRN: [id number] DOB/AGE: 07/26/1940 70 y.o.  Admit date: 12/19/2010 Discharge date: 12/20/2010  Admission Diagnoses: Lumbar spinal stenosis L3-4 L4-5 with ruptured disc L4-5  Discharge Diagnoses: Same Active Problems:  * No active hospital problems. *    Discharged Condition: good  Hospital Course: Patient was admitted as an AMA went to the operating room underwent the aforementioned procedure. Postoperatively patient did very well with recovered in the floor on the floor he is invalid well overnight with significant improvement preoperative leg pain. His incision clean and dry his voiding spontaneously and was stable to be discharged home.  Consults: Significant Diagnostic Studies: Treatments: Discharge Exam: Blood pressure 113/69, pulse 94, temperature 98.8 F (37.1 C), temperature source Oral, resp. rate 20, SpO2 93.00%. Strength is 5 out of 5 in wound is clean and dry.  Disposition: Home   Current Discharge Medication List    CONTINUE these medications which have NOT CHANGED   Details  aspirin EC 81 MG tablet Take 81 mg by mouth daily.      fish oil-omega-3 fatty acids 1000 MG capsule Take 1 g by mouth daily.      magnesium oxide (MAG-OX) 400 MG tablet Take 400 mg by mouth daily.      metFORMIN (GLUCOPHAGE) 500 MG tablet Take 500 mg by mouth daily.      metoprolol tartrate (LOPRESSOR) 25 MG tablet Take 12.5 mg by mouth 2 (two) times daily.      Multiple Vitamins-Minerals (PRESERVISION AREDS 2) CAPS Take 2 capsules by mouth daily.      naproxen (NAPROSYN) 500 MG tablet Take 500 mg by mouth 2 (two) times daily with a meal.      omeprazole (PRILOSEC) 40 MG capsule Take 40 mg by mouth daily as needed. For acid reflux      oxyCODONE-acetaminophen (PERCOCET) 5-325 MG per tablet Take 1 tablet by mouth every 6 (six) hours as needed. For pain     simvastatin (ZOCOR) 40 MG tablet Take 40 mg by mouth at bedtime.       traMADol (ULTRAM) 50 MG tablet Take 50 mg by mouth every 6 (six) hours as needed. Maximum dose= 8 tablets per day for pain        Follow-up Information    Follow up in 1 week.         Signed: Caira Poche P 12/20/2010, 8:18 AM

## 2010-12-20 NOTE — Progress Notes (Signed)
Subjective: Patient reports No pain or numbness in his legs he is walking much better  Objective: Vital signs in last 24 hours: Temp:  [97.4 F (36.3 C)-98.8 F (37.1 C)] 98.8 F (37.1 C) (12/11 0800) Pulse Rate:  [62-94] 94  (12/11 0800) Resp:  [12-20] 20  (12/11 0800) BP: (100-154)/(53-73) 113/69 mmHg (12/11 0800) SpO2:  [91 %-98 %] 93 % (12/11 0800)  Intake/Output from previous day: 12/10 0701 - 12/11 0700 In: 1980 [P.O.:480; I.V.:1500] Out: 250 [Drains:150; Blood:100] Intake/Output this shift:    Strength 5 out of 5 wound is clean and dry.  Lab Results: No results found for this basename: WBC:2,HGB:2,HCT:2,PLT:2 in the last 72 hours BMET No results found for this basename: NA:2,K:2,CL:2,CO2:2,GLUCOSE:2,BUN:2,CREATININE:2,CALCIUM:2 in the last 72 hours  Studies/Results: Dg Chest 2 View  12/19/2010  *RADIOLOGY REPORT*  Clinical Data: Preop lumbar surgery.  CHEST - 2 VIEW  Comparison: None.  Findings: Postoperative changes in the mediastinum with sternotomy wires and vascular markers and surgical clips present.  Fractures of the upper for sternotomy wires noted.  Normal heart size and pulmonary vascularity.  Hyperinflation suggesting emphysema. Slight fibrosis in the lung bases.  No focal airspace consolidation.  No blunting of costophrenic angles.  No pneumothorax.  IMPRESSION: No evidence of active pulmonary disease.  Original Report Authenticated By: Marlon Pel, M.D.   Dg Lumbar Spine 2-3 Views  12/19/2010  *RADIOLOGY REPORT*  Clinical Data: L3-4 and L4-5 laminectomy  LUMBAR SPINE - 2-3 VIEW  Comparison: None.  Findings: Image #1 reveals a needle overlying the lower portion of the L4 spinous process.  There is moderate disc degeneration at L4- 5.  Normal alignment and no fracture.  Image #2 reveals tissue spreaders posteriorly at L4-5.  Surgical instrument is located posterior to the L4 pedicle.  IMPRESSION: Surgical localization as above.  Original Report Authenticated  By: Camelia Phenes, M.D.    Assessment/Plan: Discharged home  LOS: 1 day     Ivan Beasley P 12/20/2010, 8:14 AM

## 2010-12-22 ENCOUNTER — Encounter (HOSPITAL_COMMUNITY): Payer: Self-pay | Admitting: Neurosurgery

## 2012-08-30 ENCOUNTER — Ambulatory Visit: Payer: Self-pay | Admitting: Neurosurgery

## 2012-08-30 LAB — BUN: BUN: 36 mg/dL — ABNORMAL HIGH (ref 7–18)

## 2012-08-30 LAB — CREATININE, SERUM: EGFR (African American): 48 — ABNORMAL LOW

## 2012-09-11 ENCOUNTER — Other Ambulatory Visit: Payer: Self-pay | Admitting: Neurosurgery

## 2012-09-11 DIAGNOSIS — M5126 Other intervertebral disc displacement, lumbar region: Secondary | ICD-10-CM

## 2012-09-24 ENCOUNTER — Other Ambulatory Visit: Payer: Self-pay | Admitting: Neurosurgery

## 2012-09-24 ENCOUNTER — Ambulatory Visit
Admission: RE | Admit: 2012-09-24 | Discharge: 2012-09-24 | Disposition: A | Discharge status: Home / Self Care | Payer: Medicare Other | Source: Ambulatory Visit | Attending: Neurosurgery | Admitting: Neurosurgery

## 2012-09-24 DIAGNOSIS — M5126 Other intervertebral disc displacement, lumbar region: Secondary | ICD-10-CM

## 2012-09-24 MED ORDER — METHYLPREDNISOLONE ACETATE 40 MG/ML INJ SUSP (RADIOLOG
120.0000 mg | Freq: Once | INTRAMUSCULAR | Status: AC
  Administered 2012-09-24: 120 mg via EPIDURAL

## 2012-09-24 MED ORDER — IOHEXOL 180 MG/ML  SOLN
1.0000 mL | Freq: Once | INTRAMUSCULAR | Status: AC | PRN
  Administered 2012-09-24: 1 mL via EPIDURAL

## 2013-05-07 ENCOUNTER — Ambulatory Visit: Payer: Self-pay | Admitting: General Practice

## 2013-05-07 LAB — URINALYSIS, COMPLETE
BLOOD: NEGATIVE
Bacteria: NONE SEEN
Bilirubin,UR: NEGATIVE
Glucose,UR: 50 mg/dL (ref 0–75)
Ketone: NEGATIVE
LEUKOCYTE ESTERASE: NEGATIVE
Nitrite: NEGATIVE
PROTEIN: NEGATIVE
Ph: 5 (ref 4.5–8.0)
RBC,UR: NONE SEEN /HPF (ref 0–5)
Specific Gravity: 1.011 (ref 1.003–1.030)

## 2013-05-07 LAB — BASIC METABOLIC PANEL
Anion Gap: 7 (ref 7–16)
BUN: 24 mg/dL — ABNORMAL HIGH (ref 7–18)
Calcium, Total: 9.3 mg/dL (ref 8.5–10.1)
Chloride: 105 mmol/L (ref 98–107)
Co2: 23 mmol/L (ref 21–32)
Creatinine: 1.17 mg/dL (ref 0.60–1.30)
EGFR (African American): >60
EGFR (Non-African Amer.): >60
Glucose: 188 mg/dL — ABNORMAL HIGH (ref 65–99)
Osmolality: 279 (ref 275–301)
Potassium: 4.3 mmol/L (ref 3.5–5.1)
Sodium: 135 mmol/L — ABNORMAL LOW (ref 136–145)

## 2013-05-07 LAB — MRSA PCR SCREENING

## 2013-05-07 LAB — SEDIMENTATION RATE: Erythrocyte Sed Rate: 17 mm/hr (ref 0–20)

## 2013-05-07 LAB — APTT: Activated PTT: 29.5 secs (ref 23.6–35.9)

## 2013-05-07 LAB — PROTIME-INR
INR: 1
Prothrombin Time: 12.9 secs (ref 11.5–14.7)

## 2013-05-08 LAB — URINE CULTURE

## 2013-05-21 ENCOUNTER — Inpatient Hospital Stay: Payer: Self-pay | Admitting: General Practice

## 2013-05-22 LAB — BASIC METABOLIC PANEL
Anion Gap: 7 (ref 7–16)
BUN: 15 mg/dL (ref 7–18)
CALCIUM: 8 mg/dL — AB (ref 8.5–10.1)
Chloride: 107 mmol/L (ref 98–107)
Co2: 23 mmol/L (ref 21–32)
Creatinine: 1.28 mg/dL (ref 0.60–1.30)
EGFR (African American): >60
EGFR (Non-African Amer.): 55 — ABNORMAL LOW
Glucose: 183 mg/dL — ABNORMAL HIGH (ref 65–99)
OSMOLALITY: 279 (ref 275–301)
POTASSIUM: 4.2 mmol/L (ref 3.5–5.1)
Sodium: 137 mmol/L (ref 136–145)

## 2013-05-22 LAB — HEMOGLOBIN: HGB: 10.6 g/dL — AB (ref 13.0–18.0)

## 2013-05-22 LAB — HEMOGLOBIN A1C: HEMOGLOBIN A1C: 8 % — AB (ref 4.2–6.3)

## 2013-05-22 LAB — PLATELET COUNT: Platelet: 127 10*3/uL — ABNORMAL LOW (ref 150–440)

## 2013-05-23 LAB — BASIC METABOLIC PANEL
ANION GAP: 5 — AB (ref 7–16)
BUN: 12 mg/dL (ref 7–18)
CALCIUM: 8.9 mg/dL (ref 8.5–10.1)
CO2: 25 mmol/L (ref 21–32)
Chloride: 104 mmol/L (ref 98–107)
Creatinine: 1.2 mg/dL (ref 0.60–1.30)
EGFR (Non-African Amer.): 60 — ABNORMAL LOW
Glucose: 180 mg/dL — ABNORMAL HIGH (ref 65–99)
OSMOLALITY: 273 (ref 275–301)
Potassium: 4.8 mmol/L (ref 3.5–5.1)
SODIUM: 134 mmol/L — AB (ref 136–145)

## 2013-05-23 LAB — PLATELET COUNT: PLATELETS: 137 10*3/uL — AB (ref 150–440)

## 2013-05-23 LAB — HEMOGLOBIN: HGB: 11.1 g/dL — ABNORMAL LOW (ref 13.0–18.0)

## 2013-06-03 DIAGNOSIS — Z96659 Presence of unspecified artificial knee joint: Secondary | ICD-10-CM | POA: Insufficient documentation

## 2013-06-19 ENCOUNTER — Ambulatory Visit: Payer: Self-pay | Admitting: Internal Medicine

## 2013-06-20 DIAGNOSIS — E785 Hyperlipidemia, unspecified: Secondary | ICD-10-CM | POA: Insufficient documentation

## 2013-06-20 DIAGNOSIS — E119 Type 2 diabetes mellitus without complications: Secondary | ICD-10-CM | POA: Insufficient documentation

## 2013-06-20 DIAGNOSIS — Z951 Presence of aortocoronary bypass graft: Secondary | ICD-10-CM | POA: Insufficient documentation

## 2013-06-20 DIAGNOSIS — I10 Essential (primary) hypertension: Secondary | ICD-10-CM | POA: Insufficient documentation

## 2013-06-23 DIAGNOSIS — R079 Chest pain, unspecified: Secondary | ICD-10-CM | POA: Insufficient documentation

## 2013-06-23 DIAGNOSIS — R0602 Shortness of breath: Secondary | ICD-10-CM | POA: Insufficient documentation

## 2013-06-25 ENCOUNTER — Ambulatory Visit: Payer: Self-pay | Admitting: Cardiology

## 2013-07-29 DIAGNOSIS — Z9889 Other specified postprocedural states: Secondary | ICD-10-CM | POA: Insufficient documentation

## 2013-08-29 DIAGNOSIS — J449 Chronic obstructive pulmonary disease, unspecified: Secondary | ICD-10-CM | POA: Insufficient documentation

## 2013-08-29 DIAGNOSIS — R053 Chronic cough: Secondary | ICD-10-CM | POA: Insufficient documentation

## 2013-08-29 DIAGNOSIS — R05 Cough: Secondary | ICD-10-CM | POA: Insufficient documentation

## 2014-04-23 ENCOUNTER — Ambulatory Visit
Admit: 2014-04-23 | Disposition: A | Discharge status: Home / Self Care | Payer: Self-pay | Attending: Specialist | Admitting: Specialist

## 2014-04-29 ENCOUNTER — Ambulatory Visit
Admit: 2014-04-29 | Disposition: A | Discharge status: Home / Self Care | Payer: Self-pay | Attending: Specialist | Admitting: Specialist

## 2014-04-30 ENCOUNTER — Ambulatory Visit
Admit: 2014-04-30 | Disposition: A | Discharge status: Home / Self Care | Payer: Self-pay | Attending: Oncology | Admitting: Oncology

## 2014-04-30 LAB — CBC CANCER CENTER
Basophil #: 0 x10 3/mm (ref 0.0–0.1)
Basophil %: 0.4 %
Eosinophil #: 0.3 x10 3/mm (ref 0.0–0.7)
Eosinophil %: 3.2 %
HCT: 37.4 % — ABNORMAL LOW (ref 40.0–52.0)
HGB: 12.2 g/dL — ABNORMAL LOW (ref 13.0–18.0)
Lymphocyte #: 1.5 x10 3/mm (ref 1.0–3.6)
Lymphocyte %: 18.3 %
MCH: 28.1 pg (ref 26.0–34.0)
MCHC: 32.6 g/dL (ref 32.0–36.0)
MCV: 86 fL (ref 80–100)
Monocyte #: 1.1 x10 3/mm — ABNORMAL HIGH (ref 0.2–1.0)
Monocyte %: 14.2 %
Neutrophil #: 5.1 x10 3/mm (ref 1.4–6.5)
Neutrophil %: 63.9 %
Platelet: 158 x10 3/mm (ref 150–440)
RBC: 4.33 10*6/uL — ABNORMAL LOW (ref 4.40–5.90)
RDW: 16.1 % — ABNORMAL HIGH (ref 11.5–14.5)
WBC: 8 x10 3/mm (ref 3.8–10.6)

## 2014-04-30 LAB — COMPREHENSIVE METABOLIC PANEL
ALBUMIN: 3.7 g/dL
ALK PHOS: 104 U/L
ALT: 40 U/L
Anion Gap: 7 (ref 7–16)
BILIRUBIN TOTAL: 0.7 mg/dL
BUN: 25 mg/dL — AB
CHLORIDE: 102 mmol/L
CO2: 25 mmol/L
CREATININE: 1.2 mg/dL
Calcium, Total: 9.2 mg/dL
EGFR (African American): >60
EGFR (Non-African Amer.): 59 — ABNORMAL LOW
GLUCOSE: 225 mg/dL — AB
POTASSIUM: 5 mmol/L
SGOT(AST): 46 U/L — ABNORMAL HIGH
SODIUM: 134 mmol/L — AB
Total Protein: 7.5 g/dL

## 2014-04-30 LAB — PROTIME-INR
INR: 1
PROTHROMBIN TIME: 13.3 s

## 2014-04-30 LAB — APTT: Activated PTT: 27.9 secs (ref 23.6–35.9)

## 2014-05-01 LAB — PSA: PSA: 0.8 ng/mL (ref 0.0–4.0)

## 2014-05-02 NOTE — Op Note (Signed)
PATIENT NAME:  Ivan Beasley, Ivan Beasley MR#:  [id number] DATE OF BIRTH:  Jul 23, 1940  DATE OF PROCEDURE:  05/21/2013  PREOPERATIVE DIAGNOSIS: Degenerative arthrosis of the right knee.   POSTOPERATIVE DIAGNOSIS: Degenerative arthrosis of the right knee.   PROCEDURE PERFORMED: Right total knee arthroplasty using computer-assisted navigation.   SURGEON: Skip Estimable, M.D.   ASSISTANT: Vance Peper, PA (required to maintain retraction throughout the procedure).   ANESTHESIA: Spinal.   ESTIMATED BLOOD LOSS: 100 mL.   FLUIDS REPLACED: 1300 mL of crystalloid.   TOURNIQUET TIME: 90 minutes.   DRAINS: Two medium drains to reinfusion system.   SOFT TISSUE RELEASES: Anterior cruciate ligament, posterior cruciate ligament, deep medial collateral ligament and patellofemoral ligament.   IMPLANTS UTILIZED: DePuy PFC Sigma size 4 posterior stabilized femoral component (cemented), size 5 MBT tibial component (cemented), 38 mm 3-peg oval dome patella (cemented), and a 10 mm stabilized rotating platform polyethylene insert.   INDICATIONS FOR SURGERY: The patient is a 74 year old gentleman, who has been seen for complaints of progressive bilateral knee pain with the right knee more symptomatic than the left. X-rays demonstrated severe degenerative changes in tricompartmental fashion. After discussion of the risks and benefits of surgical intervention, the patient expressed understanding of the risks, benefits, and agreed with plans for surgical intervention.   PROCEDURE IN DETAIL: The patient was brought to the operating room and then, after adequate spinal anesthesia was achieved, a tourniquet was placed on the patient's upper right thigh. The patient's right knee and leg were cleaned and prepped with alcohol and DuraPrep and draped in the usual sterile fashion. A "timeout" was performed as per usual protocol. The right lower extremity was exsanguinated using an Esmarch, and the tourniquet was inflated to 300 mmHg.  An anterior longitudinal incision was made followed by a standard mid vastus approach. Moderate effusion was evacuated. The deep fibers of the medial collateral ligament were elevated in a subperiosteal fashion off the medial flare of the tibia so as to maintain a continuous soft tissue sleeve. The patella was subluxed laterally and the patellofemoral ligament was incised. Inspection of the knee demonstrated severe degenerative changes in tricompartmental fashion with full-thickness loss of articular cartilage in the medical compartment. Prominent osteophytes were debrided using a rongeur. Anterior and posterior cruciate ligaments were excised. Two 4.0 mm Schanz pins were inserted into the femur and into the tibia for attachment of the ray of trackers used for computer-assisted navigation. Hip center was identified using circumduction technique. Distal landmarks were mapped using the computer. The distal femur and proximal tibia were mapped using the computer. The distal femoral cutting guide was positioned using computer-assisted navigation so as to achieve a 5 degree distal valgus cut. Cut was performed and verified using the computer. Distal femur was sized and it was felt that a size 4 femoral component was appropriate. A size 4 cutting guide was positioned and the anterior cut was performed and verified using the computer. This was followed by completion of the posterior and chamfer cuts. The femoral cutting guide for central box was then positioned, and the central box cut was performed.   Attention was then directed to the proximal tibia. Medial and lateral menisci were excised. The extramedullary tibial cutting guide was positioned using computer-assisted navigation so as to achieve a 0 degree varus valgus alignment and a 0 degree posterior slope. Cut was performed and verified using the computer. The proximal tibia was sized and it was felt that a size 5 tibial tray was appropriate. Tibial  and femoral  trials were inserted followed by insertion of a 10 mm polyethylene insert. Excellent mediolateral soft tissue balancing was appreciated both in flexion and in extension. Finally, the patella was cut and repaired so as to accommodate a 38 mm 3-peg oval dome patella. The patella trial was placed and the knee was placed through a range of motion with excellent patellar tracking appreciated.   Femoral trial was removed. Central post hole for the tibial component was reamed followed by insertion of a keel punch. Tibial trial was then removed. The cut surfaces of bone were irrigated with copious amounts of normal saline with antibiotic solution using pulsatile lavage and then suctioned dry. Polymethyl methacrylate cement with gentamicin was mixed in the usual fashion using a vacuum mixer. Cement was applied to the cut surface of the proximal tibia as well as along the undersurface of a size 5 MBT tibial component. The tibial component was positioned and impacted into place. Excess cement was removed using freer elevators. Cement was then applied to the cut surface of the femur as well as on the posterior flanges of a size 4 posterior stabilized femoral component. Femoral component was positioned and impacted into place. Excess cement was removed using freer elevators. A 10 mm polyethylene trial was inserted and the knee was brought in full extension with steady axial compression applied. Finally, cement was applied to the backside of a 38 mm 3-peg oval dome patella, and the patellar component was positioned and patellar clamp applied. Excess cement was removed using freer elevators.   After adequate curing of cement, the tourniquet was deflated after total tourniquet time of 90 minutes. Hemostasis was achieved using electrocautery. The knee was irrigated with copious amounts of normal saline with antibiotic solution and then suctioned dry. The knee was inspected for any residual cement debris. Then, 20 mL of 1.3%  Exparel in 40 mL of normal saline was injected along the posterior capsule, medial and lateral gutters, and along the arthrotomy site. A 10 mm stabilized rotating platform polyethylene insert was inserted and the knee was placed through range of motion, with excellent mediolateral soft tissue balancing and excellent patellar tracking appreciated. Two medium drains were placed in the wound bed and brought out through a separate stab incision to be attached to a reinfusion system. The medial parapatellar portion of the incision was reapproximated using interrupted sutures of #1 Vicryl. The subcutaneous tissue was approximated in layers using first #0 Vicryl followed by 2-0 Vicryl. Then 30 mL of 0.25% Marcaine with epinephrine was injected in the subcutaneous tissue along the incision site. Skin was then closed with skin staples. A sterile dressing was applied. The patient tolerated the procedure well. He was transported to the recovery room in stable condition.  ____________________________ Laurice Record. Holley Bouche., MD [email or web address] D: 05/22/2013 08:55:54 ET T: 05/22/2013 09:08:00 ET JOB#: [id number]  cc: Laurice Record. Holley Bouche., MD, <Dictator> JAMES P Holley Bouche MD ELECTRONICALLY SIGNED 06/02/2013 6:53

## 2014-05-02 NOTE — Discharge Summary (Signed)
PATIENT NAME:  Ivan Beasley, Ivan Beasley MR#:  [id number] DATE OF BIRTH:  07/29/1940  DATE OF ADMISSION:  05/21/2013 DATE OF DISCHARGE:  05/24/2013  ADMITTING DIAGNOSIS: Degenerative arthrosis of the right knee.   DISCHARGE DIAGNOSIS: Degenerative arthrosis of the right knee.   HISTORY: The patient is a 74 year old gentleman, who has been followed at [facility] for progression of right knee discomfort. He had reported a long history of progressive bilateral knee pain, but the right was noted be more symptomatic than the left. He had received multiple intra-articular cortisone injections in the past with temporary relief. The pain to the right knee was noted be worse with standing and weight-bearing activities. He had localized most of the pain along the medial aspect of the knee. The patient had not seen any significant improvement in his condition despite neoprene sleeve, Naprosyn and Tramadol. He had denied any gross locking or giving way of the knee. He did report progressive difficulty with ascending and descending stairs. He states that the pain had progressed to the point that it was significantly interfering with his activities of daily living. X-rays taken in [facility] showed narrowing of the medial cartilage space with associated varus alignment. He was noted to have subchondral sclerosis as well as osteophyte formation. After discussion of the risks and benefits of surgical intervention, the patient expressed his understanding of the risks and benefits and agreed for plans for surgical intervention.   PROCEDURE: Right total knee arthroplasty using computer-assisted navigation.   ANESTHESIA: Spinal.   SOFT TISSUE RELEASES: Anterior cruciate ligament, posterior cruciate ligament, deep medial collateral ligaments, as well as patellofemoral ligament.   IMPLANTS UTILIZED: DePuy PFC Sigma size 4 posterior stabilized femoral component (cemented), size 5 MBT tibial component (cemented), 38 mm  3-peg oval dome patella (cemented), and a 10 mm stabilized rotating platform polyethylene insert.   HOSPITAL COURSE: The patient tolerated the procedure very well. He had no complications. He was then taken to the PAC-U where he was stabilized and then transferred to the orthopedic floor. The patient began receiving anticoagulation therapy of Lovenox 30 mg subcutaneous q.12 hours per anesthesia and pharmacy protocol. He was fitted with TED stockings bilaterally. These were allowed to be removed 1 hour per 8 hour shift. The right one was applied on day 2 following removal of the Hemovac and dressing change. The patient was also fitted with the AV-I compression foot pumps bilaterally set at 80 mmHg. His calves have been nontender. There has been no evidence of any DVTs. Heels were elevated off the bed using rolled towels.   The patient has denied any chest pain or shortness of breath. Vital signs have been stable. He has been afebrile. Hemodynamically he was stable. No transfusions were given other than the Autovac transfusion given the first 6 hours postoperatively.   Physical therapy was initiated on day 1 for gait training and transfers. He has done very well. Upon being discharged, he was ambulating greater than 200 feet. He was able go up and down 4 steps. He was independent with bed to chair transfers. Occupational therapy was also initiated on day 1 for ADLs and assistive devices.   The patient's IV, Foley and Hemovac were discontinued on day 2 along with dressing change. The wound was free of any drainage or signs of infection. The Polar Care was reapplied to the surgical leg, maintaining a temperature of 40 to 50 degrees Fahrenheit.   DISPOSITION: The patient is being discharged to home in improved stable condition.  DISCHARGE INSTRUCTIONS: He is to continue weight-bearing as tolerated. Continue with a home health PT. He is to continue using a walker until cleared by physical therapy to go to a  quad cane. He was instructed on elevation of the lower extremity. Continue the TED stockings bilaterally. These are allowed to be removed at bedtime, but are to be worn during the day. Continue the Polar Care. Recommend that he continue wear this around-the-clock for the next 2 weeks, maintaining temperature of 40 to 50 degrees Fahrenheit. Also recommend that he continue using his incentive spirometer q.1 hour while awake and encourage him to do deep breathing q.2 hours while awake. He is placed on an ADA diet. He was instructed on wound care. The wound is not to get wet until the staples are removed. He has a followup appointment on May 26 at 9:15. He is to call the clinic sooner if any temperatures of 101.5 or greater or excessive bleeding.   DRUG ALLERGIES: No known drug allergies.   MEDICATIONS: The patient may resume his regular medications that he was on prior to admission. He was given a prescription for oxycodone 5 to 10 mg q.4 to 6 hours p.r.n. for pain, and Tylenol 50 to 100 mg q.4 to 6 hours p.r.n. for pain and Lovenox 40 mg subcutaneously daily for 14 days, then discontinue and begin taking one 81 mg enteric-coated aspirin.   PAST MEDICAL HISTORY:  1.  History of MI with heart blockage in 2000. 2.  Arthritis.  3.  Diabetes.   ____________________________ Vance Peper, PA [id number] D: 05/23/2013 07:33:00 ET T: 05/23/2013 07:57:55 ET JOB#: [id number]  cc: Vance Peper, PA, <Dictator> Cow[place] PA ELECTRONICALLY SIGNED 05/27/2013 7:47

## 2014-05-04 ENCOUNTER — Ambulatory Visit
Admit: 2014-05-04 | Disposition: A | Discharge status: Home / Self Care | Payer: Self-pay | Attending: Internal Medicine | Admitting: Internal Medicine

## 2014-05-05 ENCOUNTER — Ambulatory Visit
Admit: 2014-05-05 | Disposition: A | Discharge status: Home / Self Care | Payer: Self-pay | Attending: Anesthesiology | Admitting: Anesthesiology

## 2014-05-07 ENCOUNTER — Other Ambulatory Visit: Payer: Self-pay

## 2014-05-07 ENCOUNTER — Ambulatory Visit
Admit: 2014-05-07 | Disposition: A | Discharge status: Home / Self Care | Payer: Self-pay | Attending: Internal Medicine | Admitting: Internal Medicine

## 2014-05-11 LAB — CYTOLOGY - NON PAP

## 2014-05-12 ENCOUNTER — Encounter: Payer: Self-pay | Admitting: Oncology

## 2014-05-12 ENCOUNTER — Encounter (INDEPENDENT_AMBULATORY_CARE_PROVIDER_SITE_OTHER): Payer: Self-pay

## 2014-05-12 ENCOUNTER — Inpatient Hospital Stay: Payer: Medicare HMO | Attending: Oncology | Admitting: Oncology

## 2014-05-12 VITALS — BP 133/71 | HR 92 | Temp 98.0°F | Ht 72.0 in | Wt 223.8 lb

## 2014-05-12 DIAGNOSIS — Z5111 Encounter for antineoplastic chemotherapy: Secondary | ICD-10-CM | POA: Diagnosis not present

## 2014-05-12 DIAGNOSIS — I252 Old myocardial infarction: Secondary | ICD-10-CM

## 2014-05-12 DIAGNOSIS — M545 Low back pain: Secondary | ICD-10-CM

## 2014-05-12 DIAGNOSIS — M4806 Spinal stenosis, lumbar region: Secondary | ICD-10-CM | POA: Diagnosis not present

## 2014-05-12 DIAGNOSIS — Z87891 Personal history of nicotine dependence: Secondary | ICD-10-CM | POA: Insufficient documentation

## 2014-05-12 DIAGNOSIS — R05 Cough: Secondary | ICD-10-CM | POA: Insufficient documentation

## 2014-05-12 DIAGNOSIS — K219 Gastro-esophageal reflux disease without esophagitis: Secondary | ICD-10-CM | POA: Diagnosis not present

## 2014-05-12 DIAGNOSIS — C3412 Malignant neoplasm of upper lobe, left bronchus or lung: Secondary | ICD-10-CM | POA: Diagnosis not present

## 2014-05-12 DIAGNOSIS — E119 Type 2 diabetes mellitus without complications: Secondary | ICD-10-CM | POA: Insufficient documentation

## 2014-05-12 DIAGNOSIS — Z7982 Long term (current) use of aspirin: Secondary | ICD-10-CM

## 2014-05-12 DIAGNOSIS — I2510 Atherosclerotic heart disease of native coronary artery without angina pectoris: Secondary | ICD-10-CM

## 2014-05-12 DIAGNOSIS — G8929 Other chronic pain: Secondary | ICD-10-CM | POA: Diagnosis not present

## 2014-05-12 DIAGNOSIS — Z79899 Other long term (current) drug therapy: Secondary | ICD-10-CM | POA: Insufficient documentation

## 2014-05-12 DIAGNOSIS — M1990 Unspecified osteoarthritis, unspecified site: Secondary | ICD-10-CM | POA: Diagnosis not present

## 2014-05-12 DIAGNOSIS — C3402 Malignant neoplasm of left main bronchus: Secondary | ICD-10-CM

## 2014-05-12 DIAGNOSIS — Z951 Presence of aortocoronary bypass graft: Secondary | ICD-10-CM

## 2014-05-12 DIAGNOSIS — R590 Localized enlarged lymph nodes: Secondary | ICD-10-CM | POA: Diagnosis not present

## 2014-05-13 ENCOUNTER — Telehealth: Payer: Self-pay | Admitting: *Deleted

## 2014-05-13 NOTE — Telephone Encounter (Signed)
Informed patient that all medications to be given will be approved through insurance prior to giving. Left message for patient and informed to callback if has further questions.

## 2014-05-14 ENCOUNTER — Encounter: Payer: Self-pay | Admitting: Cardiothoracic Surgery

## 2014-05-14 ENCOUNTER — Inpatient Hospital Stay: Payer: Medicare HMO

## 2014-05-14 ENCOUNTER — Inpatient Hospital Stay: Payer: Medicare HMO | Admitting: Cardiothoracic Surgery

## 2014-05-14 VITALS — BP 126/73 | HR 76 | Temp 97.0°F | Resp 76 | Ht 72.0 in | Wt 223.0 lb

## 2014-05-14 DIAGNOSIS — C3490 Malignant neoplasm of unspecified part of unspecified bronchus or lung: Secondary | ICD-10-CM

## 2014-05-14 DIAGNOSIS — Z5111 Encounter for antineoplastic chemotherapy: Secondary | ICD-10-CM | POA: Diagnosis not present

## 2014-05-14 NOTE — Progress Notes (Signed)
RN witnessed pt's Signature for Surgery Consent and Consent for Blood products.  Surgery to be set up and the patient will be notified regarding his surgery date and phone interview.

## 2014-05-14 NOTE — Progress Notes (Signed)
[place] [facility] [place]   Name: Ivan Beasley Date: 05/14/2014 MRN: [id number] DOB: 25-Apr-1940    REFERRING PHYSICIAN: Albina Billet, MD  REASON FOR REFERRAL: Insertion of Port-A-Cath   HISTORY OF PRESENT ILLNESS:Ivan Beasley is a 74 y.o. male who presents today for insertion of a Port-A-Cath. His history begins several weeks ago when he experienced a cough which was persistent. He also had an increase in his shortness of breath. He presented to his primary care physician Dr. Benita Stabile. Dr. Hall Busing referred the patient to Dr. Raul Del. Dr. Raul Del performed a chest x-ray and treated the patient with antitussives. The chest x-ray was abnormal and this led to a CT scan. I have independently reviewed the CT scan. This revealed a left upper lobe mass with subcarinal adenopathy. The patient had a PET scan done which revealed increased uptake in the left hilum as well as in the subcarinal space. He underwent bronchoscopy last week by Dr. 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 The patient states that he has not had any hemoptysis. His shortness of breath is improved after the bronchoscopy. His cough has also improved with the introduction of antitussives. He does have a history of smoking but quit in 2001.  PAST MEDICAL HISTORY:  has a past medical history of Coronary artery disease; Myocardial infarction (2001); Pneumonia (2000); Arthritis; Chronic back pain; Bruises easily; GERD (gastroesophageal reflux disease); Blood transfusion (2001); Diabetes mellitus; Impaired hearing; and Macular degeneration.    PAST SURGICAL HISTORY: Past Surgical History  Procedure Laterality Date  . 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 Cardiac catheterization  2001  . Tonsillectomy      as a child  . Colonoscopy    . Cataract extraction      bilateral  . Lumbar laminectomy/decompression  microdiscectomy  12/19/2010    Procedure: LUMBAR LAMINECTOMY/DECOMPRESSION MICRODISCECTOMY;  Surgeon: Elaina Hoops;  Location: [facility];  Service: Neurosurgery;  Laterality: N/A;  Lumbar three-four, four-five decompressive lumbar laminectomy    ALLERGIES: Review of patient's allergies indicates no known allergies.  SOCIAL HISTORY:  reports that he has quit smoking. His smoking use included Cigarettes. He quit after 40 years of use. He does not have any smokeless tobacco history on file. He reports that he does not drink alcohol or use illicit drugs.  FAMILY HISTORY: family history is negative for Anesthesia problems, Hypotension, Malignant hyperthermia, and Pseudochol deficiency.  REVIEW OF SYSTEMS: A comprehensive review of systems was negative except for: Hearing difficulty, frequent cough, shortness of breath, knee pain, all others are negative  PHYSICAL EXAM:  BP 126/73 mmHg  Pulse 76  Temp(Src) 97 F (36.1 C) (Tympanic)  Resp 76  Ht 6' (1.829 m)  Wt 101.152 kg (223 lb)  BMI 30.24 kg/m2  SpO2 100% CONSTITUTIONAL:  Pleasant, well-developed, well-nourished, and in no acute distress. EYES: Pupils equal and reactive to light, Sclera non-icteric EARS, NOSE, MOUTH AND THROAT:  The oropharynx was clear.  Dentition is absent.  Oral mucosa pink and moist. LYMPH NODES:  Lymph nodes in the neck and axillae were normal RESPIRATORY:  Lungs were clear.  Normal respiratory effort without pathologic use of accessory muscles of respiration CARDIOVASCULAR: Heart was regular without murmurs.  There were no carotid bruits.  Well healed median sternotomy scar. GI: The abdomen was soft, nontender, and nondistended. There were no palpable masses. There was no hepatosplenomegaly.  There were normal bowel sounds in all quadrants. GU:  Rectal deferred.   MUSCULOSKELETAL:  Normal muscle strength and tone.  No clubbing or cyanosis.   SKIN:  There were no pathologic skin lesions.  There were no nodules on  palpation.  Sternal scar normal. NEUROLOGIC:  Sensation is normal.  Cranial nerves are grossly intact. PSYCH:  Oriented to person, place and time.  Mood and affect are normal.    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 I do not believe that he is a surgical candidate. I did discuss with him in great detail the indications and risks of Port-A-Cath placement. Risks of bleeding, infection, pneumothorax and death were all reviewed. He is in agreement and would like to proceed.  PLAN: I will arrange through my office the Port-A-Cath placement. I have explained to the patient I would like him to withhold his aspirin and Naprosyn. He is agreeable.  All questions were answered. The patient knows to call the clinic with any problems, questions, or concerns.

## 2014-05-15 ENCOUNTER — Telehealth: Payer: Self-pay | Admitting: *Deleted

## 2014-05-15 ENCOUNTER — Other Ambulatory Visit: Payer: Self-pay | Admitting: *Deleted

## 2014-05-15 DIAGNOSIS — C3490 Malignant neoplasm of unspecified part of unspecified bronchus or lung: Secondary | ICD-10-CM

## 2014-05-15 MED ORDER — LIDOCAINE-PRILOCAINE 2.5-2.5 % EX CREA: TOPICAL_CREAM | Freq: Once | CUTANEOUS | Status: AC

## 2014-05-15 NOTE — Telephone Encounter (Signed)
Spoke with Ivan Beasley to inform pt that EMLA has been e-scribed to pharmacy.

## 2014-05-17 ENCOUNTER — Encounter: Payer: Self-pay | Admitting: Oncology

## 2014-05-17 DIAGNOSIS — C3490 Malignant neoplasm of unspecified part of unspecified bronchus or lung: Secondary | ICD-10-CM | POA: Insufficient documentation

## 2014-05-17 NOTE — Progress Notes (Signed)
[place] @ [facility] [phone]  Fax:([phone] [place]: 02-25-1940  MR#: [id number]  ELF#:[id number]  Patient Care Team: Albina Billet, MD as PCP - General (Internal Medicine)  CHIEF COMPLAINT:  Chief Complaint  Patient presents with  . Follow-up    Biopsy results, and treatment plan    Oncology History   74 year old gentleman with left hilar mass and mediastinal adenopathy Chronic smoker quit smoking in 2000 Non-insulin-dependent diabetes History of coronary artery disease with status post bypass surgery done in year 2000    Non-Gyn Cytology Exam  CASE: [id number]  PATIENT: Wyvonna Plum  Non-Gyn Cytology Report      SPECIMEN SUBMITTED:  A. FNA, lung, left,upper lobe  B. FNA, lung, left,hilum   CLINICAL HISTORY:  None provided   PRE-OPERATIVE DIAGNOSIS:  None Provided; None Provided   POST-OPERATIVE DIAGNOSIS:       DIAGNOSIS:  A. LUNG, LEFT UPPER LOBE; EBUS/FNA WITH PREPARED SMEARS, IMMEDIATE  EVALUATION, AND CELL BLOCK PREPARATIONS:  - MALIGNANT CELLS PRESENT.  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  - SEE COMMENT.   B. LUNG HILUM, LEFT; EBUS/FNA WITH PREPARED SMEARS, IMMEDIATE  EVALUATION, AND CELL BLOCK PREPARATIONS:  - MALIGNANT CELLS PRESENT.  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              Cancer of lung    Oncology Flowsheet 12/19/2010  ondansetron (ZOFRAN) IJ -    INTERVAL HISTORY: Patient underwent EBUS and biopsy was positive for squamous cell carcinoma of lung.  Was stages IIIa disease.  Here for further follow-up and treatment consideration.  Had one episode of hemoptysis after bronchoscopy .  Patient was advised to stop taking aspirin.  Hemoptysis has resolved Patient is here to discuss the results and further planning of treatment  REVIEW OF SYSTEMS:   Review of Systems  Constitutional: Negative for fever, chills, weight loss, malaise/fatigue  and diaphoresis.  HENT: Negative for congestion, ear discharge, ear pain, hearing loss, nosebleeds, sore throat and tinnitus.   Eyes: Negative for blurred vision, double vision, photophobia, pain, discharge and redness.  Respiratory: Negative for cough, hemoptysis, sputum production, shortness of breath, wheezing and stridor.   Cardiovascular: Negative for chest pain, palpitations, orthopnea, claudication, leg swelling and PND.  Gastrointestinal: Negative for heartburn, nausea, vomiting, abdominal pain, diarrhea, constipation, blood in stool and melena.  Genitourinary: Negative for dysuria, urgency, frequency, hematuria and flank pain.  Musculoskeletal: Negative for myalgias, back pain, joint pain, falls and neck pain.  Skin: Negative for itching and rash.  Neurological: Negative for dizziness, tingling, tremors, sensory change, speech change, focal weakness, seizures, loss of consciousness, weakness and headaches.  Endo/Heme/Allergies: Negative for environmental allergies and polydipsia. Does not bruise/bleed easily.  Psychiatric/Behavioral: Negative for depression, suicidal ideas, hallucinations, memory loss and substance abuse. The patient is not nervous/anxious and does not have insomnia.   All other systems reviewed and are negative.   As per HPI. Otherwise, a complete review of systems is negatve.  PAST MEDICAL HISTORY: Past Medical History  Diagnosis Date  . Coronary artery disease   . Myocardial infarction 2001  . Pneumonia 2000  . Arthritis   . 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 Bruises easily   . GERD (gastroesophageal reflux disease)     takes Omeprazole daily as needed for stomach pain  . Blood transfusion 2001  . Diabetes mellitus     takes Metformin daily;  .  Impaired hearing     bil hearing aide  . Macular degeneration     being watched for this but hasn't been "completely" diagnosed    PAST SURGICAL HISTORY: Past Surgical History  Procedure  Laterality Date  . 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 Cardiac catheterization  2001  . Tonsillectomy      as a child  . Colonoscopy    . Cataract extraction      bilateral  . Lumbar laminectomy/decompression microdiscectomy  12/19/2010    Procedure: LUMBAR LAMINECTOMY/DECOMPRESSION MICRODISCECTOMY;  Surgeon: Elaina Hoops;  Location: [facility];  Service: Neurosurgery;  Laterality: N/A;  Lumbar three-four, four-five decompressive lumbar laminectomy    FAMILY HISTORY Family History  Problem Relation Age of Onset  . Anesthesia problems Neg Hx   . Hypotension Neg Hx   . Malignant hyperthermia Neg Hx   . Pseudochol deficiency Neg Hx         ADVANCED DIRECTIVES:    HEALTH MAINTENANCE: History  Substance Use Topics  . Smoking status: Former Smoker -- 40 years    Types: Cigarettes  . Smokeless tobacco: Not on file     Comment: 2001  . Alcohol Use: No     No Known Allergies  Current Outpatient Prescriptions  Medication Sig Dispense Refill  . albuterol (PROVENTIL HFA;VENTOLIN HFA) 108 (90 BASE) MCG/ACT inhaler Inhale 2 puffs into the lungs as needed for wheezing or shortness of breath.     . Alcohol Swabs (B-D SINGLE USE SWABS REGULAR) PADS     . Blood Glucose Calibration (TRUETEST CONTROL LEVEL 1) LIQD     . CHERATUSSIN AC 100-10 MG/5ML syrup     . glimepiride (AMARYL) 1 MG tablet     . glucose blood test strip     . HYDROcodone-acetaminophen (NORCO/VICODIN) 5-325 MG per tablet Take by mouth.    Elmore Guise Devices (TRUEDRAW LANCING DEVICE) MISC     . magnesium oxide (MAG-OX) 400 MG tablet Take 400 mg by mouth daily.      . metFORMIN (GLUCOPHAGE) 500 MG tablet Take 500 mg by mouth daily. Take '1000mg'$  po in morning, take '500mg'$  po at bedtime    . metoprolol (LOPRESSOR) 50 MG tablet 50 mg 2 (two) times daily.    . Multiple Vitamins-Minerals (Carroll 50+) CAPS Take by mouth.    Marland Kitchen omeprazole (PRILOSEC) 40 MG capsule Take 40 mg by mouth daily as needed. For  acid reflux      . oxyCODONE-acetaminophen (PERCOCET) 5-325 MG per tablet Take 1 tablet by mouth every 6 (six) hours as needed. For pain     . simvastatin (ZOCOR) 40 MG tablet Take 40 mg by mouth at bedtime.      . SYMBICORT 160-4.5 MCG/ACT inhaler   12  . traMADol (ULTRAM) 50 MG tablet Take 50 mg by mouth every 6 (six) hours as needed. Maximum dose= 8 tablets per day for pain     . TRUEPLUS LANCETS 28G MISC     . aspirin EC 81 MG tablet Take 81 mg by mouth daily. HELD SINCE 05/24/14 DUE TO CT GUIDED BX. PLEASE HOLD DO NOT GIVE TO PATIENT DUE TO PORT PLACEMENT    . naproxen (NAPROSYN) 500 MG tablet Take 500 mg by mouth 2 (two) times daily with a meal. HAS HELD SINCE 05/24/14 DUE TO UPCOMING PROCEDURE. HOLD DUE TO PORT A CATH PLACEMENT     No current facility-administered medications for this visit.  Facility-Administered Medications Ordered in Other Visits  Medication Dose Route Frequency Provider Last Rate Last Dose  . lidocaine-prilocaine (EMLA) cream   Topical Once Evlyn Kanner, NP        OBJECTIVE: Filed Vitals:   05/12/14 1534  BP: 133/71  Pulse: 92  Temp: 98 F (36.7 C)     Body mass index is 30.34 kg/(m^2).    ECOG FS:1 - Symptomatic but completely ambulatory  Physical Exam  Constitutional: No distress.  HENT:  Head: Normocephalic and atraumatic.  Right Ear: External ear normal.  Left Ear: External ear normal.  Nose: Nose normal.  Mouth/Throat: Oropharynx is clear and moist.  Eyes: Conjunctivae and EOM are normal. Pupils are equal, round, and reactive to light.  Neck: Normal range of motion. Neck supple. No JVD present. No tracheal deviation present. No thyromegaly present.  Cardiovascular: Normal rate, normal heart sounds and intact distal pulses.  Exam reveals no gallop and no friction rub.   No murmur heard. Pulmonary/Chest: No stridor. No respiratory distress. He has no wheezes. He has no rales. He exhibits no tenderness.  Abdominal: He exhibits no distension and  no mass. There is no tenderness. There is no rebound and no guarding.  Musculoskeletal: He exhibits no edema or tenderness.  Neurological: He displays normal reflexes. No cranial nerve deficit. He exhibits normal muscle tone. Coordination normal.  Skin: No rash noted. He is not diaphoretic. No erythema.  Psychiatric: Memory, affect and judgment normal.  Nursing note and vitals reviewed.    LAB RESULTS:     Component Value Date/Time   NA 134* 04/30/2014 0938   NA 138 12/15/2010 1500   K 5.0 04/30/2014 0938   K 4.8 12/15/2010 1500   CL 102 04/30/2014 0938   CL 104 12/15/2010 1500   CO2 25 04/30/2014 0938   CO2 24 12/15/2010 1500   GLUCOSE 225* 04/30/2014 0938   GLUCOSE 145* 12/15/2010 1500   BUN 25* 04/30/2014 0938   BUN 23 12/15/2010 1500   CREATININE 1.20 04/30/2014 0938   CREATININE 1.18 12/15/2010 1500   CALCIUM 9.2 04/30/2014 0938   CALCIUM 9.9 12/15/2010 1500   PROT 7.5 04/30/2014 0938   ALBUMIN 3.7 04/30/2014 0938   AST 46* 04/30/2014 0938   ALT 40 04/30/2014 0938   ALKPHOS 104 04/30/2014 0938   GFRNONAA 59* 04/30/2014 0938   GFRNONAA 60* 12/15/2010 1500   GFRAA >60 04/30/2014 0938   GFRAA 70* 12/15/2010 1500    No results found for: SPEP, UPEP  Lab Results  Component Value Date   WBC 8.0 04/30/2014   NEUTROABS 5.1 04/30/2014   HGB 12.2* 04/30/2014   HCT 37.4* 04/30/2014   MCV 86 04/30/2014   PLT 158 04/30/2014      Chemistry      Component Value Date/Time   NA 134* 04/30/2014 0938   NA 138 12/15/2010 1500   K 5.0 04/30/2014 0938   K 4.8 12/15/2010 1500   CL 102 04/30/2014 0938   CL 104 12/15/2010 1500   CO2 25 04/30/2014 0938   CO2 24 12/15/2010 1500   BUN 25* 04/30/2014 0938   BUN 23 12/15/2010 1500   CREATININE 1.20 04/30/2014 0938   CREATININE 1.18 12/15/2010 1500      Component Value Date/Time   CALCIUM 9.2 04/30/2014 0938   CALCIUM 9.9 12/15/2010 1500   ALKPHOS 104 04/30/2014 0938   AST 46* 04/30/2014 0938   ALT 40 04/30/2014 0938        STUDIES: Nm Pet  Nopr Skull Base To Thigh  04/29/2014   CLINICAL DATA:  Initial treatment strategy for lung cancer.  EXAM: NUCLEAR MEDICINE PET SKULL BASE TO THIGH  TECHNIQUE: Twelve point to mCi F-18 FDG was injected intravenously. Full-ring PET imaging was performed from the skull base to thigh after the radiotracer. CT data was obtained and used for attenuation correction and anatomic localization.  FASTING BLOOD GLUCOSE:  Value: 166 mg/dl  COMPARISON:  Chest CT 04/23/2014.  FINDINGS: NECK  No hypermetabolic lymph nodes in the neck.  CHEST  Again noted is a a hypermetabolic (SUVmax = 78.9) mass in the perihilar aspect of the left upper lobe which is difficult to measure, but is estimated to measure approximately 4.3 x 2.4 cm (images 160 and 157 of series 3). There is associated a 9 mm left hilar hypermetabolic lymph node (SUVmax = 10.5), and mildly enlarged subcarinal lymph nodes measuring up to 13 mm in short axis which are hypermetabolic (SUVmax = 8.3). No contralateral hilar or paratracheal lymphadenopathy is noted. However, there are numerous nonenlarged but prominent non hypermetabolic anterior and middle mediastinal lymph nodes which are nonspecific, but conspicuous by their number. No other pulmonary nodules are noted. Some mild postobstructive changes are noted in the inferior segment of the lingula distal to the mass. Mild diffuse bronchial wall thickening with mild centrilobular and paraseptal emphysema. Heart size is normal. There is no significant pericardial fluid, thickening or pericardial calcification. There is atherosclerosis of the thoracic aorta, the great vessels of the mediastinum and the coronary arteries, including calcified atherosclerotic plaque in the left main, left anterior descending, left circumflex and right coronary arteries. Status post median sternotomy for CABG, including LIMA to the LAD.  ABDOMEN/PELVIS  No abnormal hypermetabolic activity within the liver, pancreas,  adrenal glands, or spleen. No hypermetabolic lymph nodes in the abdomen or pelvis. Diffuse low attenuation throughout the hepatic parenchyma, compatible with hepatic steatosis. Tiny calcified gallstones lying dependently in the neck of the gallbladder. No current findings to suggest an acute cholecystitis at this time. Small diverticulum from the third portion of the duodenum incidentally noted. No surrounding inflammatory changes. 3.9 cm exophytic low-attenuation non hypermetabolic lesion in the lower pole of the left kidney, incompletely characterized on today's examination, but likely to represent a cyst. Numerous colonic diverticulae are noted, without surrounding inflammatory changes to suggest an acute diverticulitis at this time. Normal appendix. Extensive atherosclerosis throughout the abdominal and pelvic vasculature.  SKELETON  No focal hypermetabolic activity to suggest skeletal metastasis. Status post L4-L5 laminectomy. Median sternotomy wires.  IMPRESSION: 1. 4.3 x 2.4 cm perihilar mass in the left upper lobe with associated hypermetabolic left hilar and subcarinal lymphadenopathy. No evidence of distant metastatic disease in the neck, chest, abdomen or pelvis. Assuming non-small-cell in etiology, findings are compatible with T2a, N2, Mx disease (i.e., likely stage IIIA). 2. Hepatic steatosis. 3. Cholelithiasis without evidence of acute cholecystitis at this time. 4. Colonic diverticulosis without evidence of acute diverticulitis. 5. Atherosclerosis, including left main and 3 vessel coronary artery disease. Status post median sternotomy for CABG, including LIMA to the LAD. 6. Additional incidental findings, as above.   Electronically Signed   By: Vinnie Langton M.D.   On: 04/29/2014 15:18    ASSESSMENT: \ 1.  Carcinoma of lung left upper lobe.  Stage IIIa squamous cell carcinoma.  Diagnosis buying EBUS. patient is unresectable based on CT and PET scan criteria.  Will opt pain opinion from  thoracic surgeon.Marland Kitchen  PLAN: Surgical opinion has been discussed.  Patient may need port placement Has to attend chemotherapy class Combination of carboplatinum and Taxol has been discussed Intent of chemotherapy is palliation and relief in symptoms and extending survival All the side effects of chemotherapy including myelosuppression, alopecia, nausea vomiting fatigue weakness.  Secondary infection, and   peripheral neuropathy .  Has been discussed in details. Informal consent has been obtained and will be documented by nurses in the chart All the questions regarding chemotherapy is been answered 1.combination of chemotherapy nd radiation therapy has been discussed..  2. Patient expressed understanding and was in agreement with this plan. He also understands that He can call clinic at any time with any questions, concerns, or complaints.    Cancer of lung   Staging form: Lung, AJCC 7th Edition     Clinical: T2, N2, M0 - Unsigned   Forest Gleason, MD   05/17/2014 11:04 AM

## 2014-05-18 ENCOUNTER — Telehealth: Payer: Self-pay | Admitting: *Deleted

## 2014-05-18 MED ORDER — BENZONATATE 100 MG PO CAPS: 100.0000 mg | ORAL_CAPSULE | Freq: Three times a day (TID) | ORAL | Status: DC | PRN

## 2014-05-18 MED ORDER — PREDNISONE 10 MG PO TABS: ORAL_TABLET | ORAL | Status: DC

## 2014-05-18 NOTE — Telephone Encounter (Signed)
Coughing constantly keeps up at night taking Cheratussin which helps for a little bit, but wears off before next dose due. Asking if there is anything that can be done. Had been on Steroids in April before diagnosed and it seemed to help. Chest is sore form all the coughing

## 2014-05-18 NOTE — Telephone Encounter (Signed)
Informed Olivia Mackie of rx being sent to pharmacy and to continue Cheratussin too. She then asked about when his port is being placed and other appt to be scheduled I looked in schedules and see that patient is scheduled for port placement on 5/11 and no one has informed them of this or given any instructions for pre op

## 2014-05-18 NOTE — Telephone Encounter (Signed)
Spoke with patient's wife, Olivia Mackie, at 68. RN Explained to wife that the surgery was just arranged for Wednesday.  RN received notification of this at 1630 this afternoon.  Wife made aware to call the surgery department tomorrow between 1 and 3 pm at [phone] to find out pt's arrival time.

## 2014-05-19 ENCOUNTER — Encounter: Payer: Self-pay | Admitting: *Deleted

## 2014-05-19 ENCOUNTER — Other Ambulatory Visit: Payer: Medicare HMO

## 2014-05-19 DIAGNOSIS — G8929 Other chronic pain: Secondary | ICD-10-CM | POA: Diagnosis not present

## 2014-05-19 DIAGNOSIS — K219 Gastro-esophageal reflux disease without esophagitis: Secondary | ICD-10-CM | POA: Diagnosis not present

## 2014-05-19 DIAGNOSIS — I10 Essential (primary) hypertension: Secondary | ICD-10-CM | POA: Diagnosis not present

## 2014-05-19 DIAGNOSIS — M549 Dorsalgia, unspecified: Secondary | ICD-10-CM | POA: Diagnosis not present

## 2014-05-19 DIAGNOSIS — C3490 Malignant neoplasm of unspecified part of unspecified bronchus or lung: Secondary | ICD-10-CM | POA: Diagnosis present

## 2014-05-19 DIAGNOSIS — Z79899 Other long term (current) drug therapy: Secondary | ICD-10-CM | POA: Diagnosis not present

## 2014-05-19 DIAGNOSIS — Z87891 Personal history of nicotine dependence: Secondary | ICD-10-CM | POA: Diagnosis not present

## 2014-05-19 DIAGNOSIS — E119 Type 2 diabetes mellitus without complications: Secondary | ICD-10-CM | POA: Diagnosis not present

## 2014-05-19 DIAGNOSIS — Z8701 Personal history of pneumonia (recurrent): Secondary | ICD-10-CM | POA: Diagnosis not present

## 2014-05-19 DIAGNOSIS — C3492 Malignant neoplasm of unspecified part of left bronchus or lung: Secondary | ICD-10-CM | POA: Diagnosis not present

## 2014-05-19 DIAGNOSIS — H9190 Unspecified hearing loss, unspecified ear: Secondary | ICD-10-CM | POA: Diagnosis not present

## 2014-05-19 DIAGNOSIS — H3530 Unspecified macular degeneration: Secondary | ICD-10-CM | POA: Diagnosis not present

## 2014-05-19 DIAGNOSIS — I2510 Atherosclerotic heart disease of native coronary artery without angina pectoris: Secondary | ICD-10-CM | POA: Diagnosis not present

## 2014-05-19 DIAGNOSIS — Z951 Presence of aortocoronary bypass graft: Secondary | ICD-10-CM | POA: Diagnosis not present

## 2014-05-19 DIAGNOSIS — I252 Old myocardial infarction: Secondary | ICD-10-CM | POA: Diagnosis not present

## 2014-05-19 DIAGNOSIS — J449 Chronic obstructive pulmonary disease, unspecified: Secondary | ICD-10-CM | POA: Diagnosis not present

## 2014-05-19 DIAGNOSIS — M1990 Unspecified osteoarthritis, unspecified site: Secondary | ICD-10-CM | POA: Diagnosis not present

## 2014-05-19 NOTE — Patient Instructions (Signed)
  Your procedure is scheduled on: 05/20/14 Report to Day Surgery. To find out your arrival time please call [phone] between 1PM - 3PM on 05/19/14.  Remember: Instructions that are not followed completely may result in serious medical risk, up to and including death, or upon the discretion of your surgeon and anesthesiologist your surgery may need to be rescheduled.    ___x_ 1. Do not eat food or drink liquids after midnight. No gum chewing or hard candies.     ___x_ 2. No Alcohol for 24 hours before or after surgery.   ____ 3. Bring all medications with you on the day of surgery if instructed.    _x___ 4. Notify your doctor if there is any change in your medical condition     (cold, fever, infections).     Do not wear jewelry, make-up, hairpins, clips or nail polish.  Do not wear lotions, powders, or perfumes. You may wear deodorant.  Do not shave 48 hours prior to surgery. Men may shave face and neck.  Do not bring valuables to the hospital.    [facility] is not responsible for any belongings or valuables.               Contacts, dentures or bridgework may not be worn into surgery.  Leave your suitcase in the car. After surgery it may be brought to your room.  For patients admitted to the hospital, discharge time is determined by your                treatment team.   Patients discharged the day of surgery will not be allowed to drive home.   Please read over the following fact sheets that you were given:     ___x_ Take these medicines the morning of surgery with A SIP OF WATER:    1.omeprazole  2. Metoprolol  3. prednisone  4.  5.  6.  ____ Fleet Enema (as directed)   ____ Use CHG Soap as directed  __x__ Use inhalers on the day of surgery  ___x_ Stop metformin 2 days prior to surgery    ____ Take 1/2 of usual insulin dose the night before surgery and none on the morning of surgery.   ____ Stop Coumadin/Plavix/aspirin on   ____ Stop Anti-inflammatories on     ____ Stop supplements until after surgery.    ____ Bring C-Pap to the hospital.

## 2014-05-20 ENCOUNTER — Ambulatory Visit: Payer: Medicare HMO | Admitting: Anesthesiology

## 2014-05-20 ENCOUNTER — Ambulatory Visit: Payer: Medicare HMO | Admitting: Radiation Oncology

## 2014-05-20 ENCOUNTER — Ambulatory Visit: Payer: Medicare HMO

## 2014-05-20 ENCOUNTER — Institutional Professional Consult (permissible substitution): Payer: Medicare HMO | Admitting: Radiation Oncology

## 2014-05-20 ENCOUNTER — Encounter
Admission: RE | Disposition: A | Discharge status: Home / Self Care | Payer: Self-pay | Source: Ambulatory Visit | Attending: Cardiothoracic Surgery

## 2014-05-20 ENCOUNTER — Ambulatory Visit
Admission: RE | Admit: 2014-05-20 | Discharge: 2014-05-20 | Disposition: A | Discharge status: Home / Self Care | Payer: Medicare HMO | Source: Ambulatory Visit | Attending: Cardiothoracic Surgery | Admitting: Cardiothoracic Surgery

## 2014-05-20 DIAGNOSIS — I2510 Atherosclerotic heart disease of native coronary artery without angina pectoris: Secondary | ICD-10-CM | POA: Insufficient documentation

## 2014-05-20 DIAGNOSIS — H3530 Unspecified macular degeneration: Secondary | ICD-10-CM | POA: Insufficient documentation

## 2014-05-20 DIAGNOSIS — M549 Dorsalgia, unspecified: Secondary | ICD-10-CM | POA: Insufficient documentation

## 2014-05-20 DIAGNOSIS — E119 Type 2 diabetes mellitus without complications: Secondary | ICD-10-CM | POA: Insufficient documentation

## 2014-05-20 DIAGNOSIS — I252 Old myocardial infarction: Secondary | ICD-10-CM | POA: Insufficient documentation

## 2014-05-20 DIAGNOSIS — K219 Gastro-esophageal reflux disease without esophagitis: Secondary | ICD-10-CM | POA: Insufficient documentation

## 2014-05-20 DIAGNOSIS — Z87891 Personal history of nicotine dependence: Secondary | ICD-10-CM | POA: Insufficient documentation

## 2014-05-20 DIAGNOSIS — Z951 Presence of aortocoronary bypass graft: Secondary | ICD-10-CM | POA: Insufficient documentation

## 2014-05-20 DIAGNOSIS — H9190 Unspecified hearing loss, unspecified ear: Secondary | ICD-10-CM | POA: Insufficient documentation

## 2014-05-20 DIAGNOSIS — C3492 Malignant neoplasm of unspecified part of left bronchus or lung: Secondary | ICD-10-CM | POA: Insufficient documentation

## 2014-05-20 DIAGNOSIS — C3490 Malignant neoplasm of unspecified part of unspecified bronchus or lung: Secondary | ICD-10-CM

## 2014-05-20 DIAGNOSIS — Z79899 Other long term (current) drug therapy: Secondary | ICD-10-CM | POA: Insufficient documentation

## 2014-05-20 DIAGNOSIS — I10 Essential (primary) hypertension: Secondary | ICD-10-CM | POA: Insufficient documentation

## 2014-05-20 DIAGNOSIS — J449 Chronic obstructive pulmonary disease, unspecified: Secondary | ICD-10-CM | POA: Insufficient documentation

## 2014-05-20 DIAGNOSIS — M1990 Unspecified osteoarthritis, unspecified site: Secondary | ICD-10-CM | POA: Insufficient documentation

## 2014-05-20 DIAGNOSIS — G8929 Other chronic pain: Secondary | ICD-10-CM | POA: Insufficient documentation

## 2014-05-20 DIAGNOSIS — Z8701 Personal history of pneumonia (recurrent): Secondary | ICD-10-CM | POA: Insufficient documentation

## 2014-05-20 HISTORY — PX: PORTACATH PLACEMENT: SHX2246

## 2014-05-20 LAB — GLUCOSE, CAPILLARY
GLUCOSE-CAPILLARY: 256 mg/dL — AB (ref 70–99)
Glucose-Capillary: 297 mg/dL — ABNORMAL HIGH (ref 70–99)

## 2014-05-20 SURGERY — INSERTION, TUNNELED CENTRAL VENOUS DEVICE, WITH PORT
Anesthesia: General

## 2014-05-20 MED ORDER — SODIUM CHLORIDE 0.9 % IV SOLN
INTRAVENOUS | Status: DC
  Administered 2014-05-20: 13:00:00 via INTRAVENOUS

## 2014-05-20 MED ORDER — LIDOCAINE HCL 1 % IJ SOLN
INTRAMUSCULAR | Status: DC | PRN
  Administered 2014-05-20: 6 mL

## 2014-05-20 MED ORDER — LIDOCAINE HCL (CARDIAC) 20 MG/ML IV SOLN
INTRAVENOUS | Status: DC | PRN
  Administered 2014-05-20: 60 mg via INTRAVENOUS

## 2014-05-20 MED ORDER — ONDANSETRON HCL 4 MG/2ML IJ SOLN
INTRAMUSCULAR | Status: DC | PRN
  Administered 2014-05-20: 4 mg via INTRAVENOUS

## 2014-05-20 MED ORDER — ONDANSETRON HCL 4 MG/2ML IJ SOLN: 4.0000 mg | Freq: Once | INTRAMUSCULAR | Status: DC | PRN

## 2014-05-20 MED ORDER — CEFAZOLIN SODIUM-DEXTROSE 2-3 GM-% IV SOLR
INTRAVENOUS | Status: AC
  Filled 2014-05-20: qty 50

## 2014-05-20 MED ORDER — HEPARIN SODIUM (PORCINE) 5000 UNIT/ML IJ SOLN
INTRAMUSCULAR | Status: AC
  Filled 2014-05-20: qty 1

## 2014-05-20 MED ORDER — EPHEDRINE SULFATE 50 MG/ML IJ SOLN
INTRAMUSCULAR | Status: DC | PRN
  Administered 2014-05-20: 10 mg via INTRAVENOUS

## 2014-05-20 MED ORDER — MIDAZOLAM HCL 5 MG/5ML IJ SOLN
INTRAMUSCULAR | Status: DC | PRN
  Administered 2014-05-20: 2 mg via INTRAVENOUS

## 2014-05-20 MED ORDER — CEFAZOLIN SODIUM-DEXTROSE 2-3 GM-% IV SOLR
2.0000 g | Freq: Once | INTRAVENOUS | Status: AC
  Administered 2014-05-20: 2 g via INTRAVENOUS

## 2014-05-20 MED ORDER — LIDOCAINE HCL (PF) 1 % IJ SOLN
INTRAMUSCULAR | Status: AC
  Filled 2014-05-20: qty 30

## 2014-05-20 MED ORDER — FENTANYL CITRATE (PF) 100 MCG/2ML IJ SOLN: 25.0000 ug | INTRAMUSCULAR | Status: DC | PRN

## 2014-05-20 MED ORDER — FENTANYL CITRATE (PF) 100 MCG/2ML IJ SOLN
INTRAMUSCULAR | Status: DC | PRN
Start: 2014-05-20 — End: 2014-05-20
  Administered 2014-05-20: 1 ug via INTRAVENOUS
  Administered 2014-05-20: .5 ug via INTRAVENOUS

## 2014-05-20 MED ORDER — PROPOFOL 10 MG/ML IV BOLUS
INTRAVENOUS | Status: DC | PRN
  Administered 2014-05-20: 150 mg via INTRAVENOUS

## 2014-05-20 SURGICAL SUPPLY — 37 items
BAG DECANTER STRL (MISCELLANEOUS) ×2 IMPLANT
BLADE SURG SZ11 CARB STEEL (BLADE) ×2 IMPLANT
CANISTER SUCT 1200ML W/VALVE (MISCELLANEOUS) ×2 IMPLANT
CHLORAPREP W/TINT 26ML (MISCELLANEOUS) ×2 IMPLANT
COVER LIGHT HANDLE STERIS (MISCELLANEOUS) ×4 IMPLANT
DRAPE C-ARM XRAY 36X54 (DRAPES) ×2 IMPLANT
DRAPE INCISE IOBAN 66X45 STRL (DRAPES) ×2 IMPLANT
DRESSING TELFA 4X3 1S ST N-ADH (GAUZE/BANDAGES/DRESSINGS) ×2 IMPLANT
DRSG TEGADERM 2-3/8X2-3/4 SM (GAUZE/BANDAGES/DRESSINGS) ×2 IMPLANT
DRSG TEGADERM 4X4.75 (GAUZE/BANDAGES/DRESSINGS) ×2 IMPLANT
ELECT CAUTERY BLADE TIP 2.5 (TIP) ×2
ELECTRODE CAUTERY BLDE TIP 2.5 (TIP) ×1 IMPLANT
GLOVE EXAM LX STRL 7.5 (GLOVE) ×2 IMPLANT
GOWN STRL REUS W/ TWL LRG LVL3 (GOWN DISPOSABLE) ×2 IMPLANT
GOWN STRL REUS W/TWL LRG LVL3 (GOWN DISPOSABLE) ×2
IV NS 500ML (IV SOLUTION) ×1
IV NS 500ML BAXH (IV SOLUTION) ×1 IMPLANT
KIT RM TURNOVER STRD PROC AR (KITS) ×2 IMPLANT
LABEL OR SOLS (LABEL) ×2 IMPLANT
MARKER SKIN W/RULER 31145785 (MISCELLANEOUS) ×2 IMPLANT
NDL SAFETY 22GX1.5 (NEEDLE) ×2 IMPLANT
NEEDLE FILTER BLUNT 18X 1/2SAF (NEEDLE) ×1
NEEDLE FILTER BLUNT 18X1 1/2 (NEEDLE) ×1 IMPLANT
NS IRRIG 500ML POUR BTL (IV SOLUTION) ×2 IMPLANT
PACK PORT-A-CATH (MISCELLANEOUS) ×2 IMPLANT
PAD GROUND ADULT SPLIT (MISCELLANEOUS) ×2 IMPLANT
PAD TELFA 2X3 NADH STRL (GAUZE/BANDAGES/DRESSINGS) ×2 IMPLANT
PORTACATH POWER 8F (Port) ×2 IMPLANT
PORTACATH POWER 8FR IMPLANT
SUT ETHILON 4-0 (SUTURE) ×2
SUT ETHILON 4-0 FS2 18XMFL BLK (SUTURE) ×2
SUT PROLENE 2 0 SH DA (SUTURE) ×4 IMPLANT
SUT VIC AB 3-0 SH 27 (SUTURE) ×1
SUT VIC AB 3-0 SH 27X BRD (SUTURE) ×1 IMPLANT
SUTURE ETHLN 4-0 FS2 18XMF BLK (SUTURE) ×2 IMPLANT
SYR 3ML LL SCALE MARK (SYRINGE) ×2 IMPLANT
SYRINGE 10CC LL (SYRINGE) ×2 IMPLANT

## 2014-05-20 NOTE — Anesthesia Preprocedure Evaluation (Signed)
Anesthesia Evaluation  Patient identified by MRN, date of birth, ID band Patient awake    Airway Mallampati: II  TM Distance: >3 FB Neck ROM: Full    Dental  (+) Upper Dentures, Lower Dentures, Edentulous Upper, Edentulous Lower   Pulmonary shortness of breath (not when using inhaler), pneumonia -, resolved, COPD COPD inhaler, former smoker,          Cardiovascular hypertension, Pt. on medications and Pt. on home beta blockers + CAD and + Past MI     Neuro/Psych    GI/Hepatic GERD-  ,  Endo/Other  diabetes, Type 2, Oral Hypoglycemic Agents  Renal/GU      Musculoskeletal   Abdominal   Peds  Hematology   Anesthesia Other Findings   Reproductive/Obstetrics                             Anesthesia Physical Anesthesia Plan  ASA: III  Anesthesia Plan: General   Post-op Pain Management:    Induction:   Airway Management Planned: LMA  Additional Equipment:   Intra-op Plan:   Post-operative Plan:   Informed Consent: I have reviewed the patients History and Physical, chart, labs and discussed the procedure including the risks, benefits and alternatives for the proposed anesthesia with the patient or authorized representative who has indicated his/her understanding and acceptance.     Plan Discussed with: CRNA  Anesthesia Plan Comments:         Anesthesia Quick Evaluation

## 2014-05-20 NOTE — OR Nursing (Signed)
fluroscopy 76mn 40 sec.

## 2014-05-20 NOTE — OR Nursing (Signed)
Ivan Beasley x-ray

## 2014-05-20 NOTE — OR Nursing (Signed)
68m hepain flush used inter op    ( 5000 units heparin /5086mNaCl )

## 2014-05-20 NOTE — Op Note (Signed)
05/20/2014  2:48 PM  PATIENT:  Ivan Beasley  74 y.o. male  PRE-OPERATIVE DIAGNOSIS:  lung cancer  POST-OPERATIVE DIAGNOSIS:  lung cancer  PROCEDURE:  Procedure(s): INSERTION PORT-A-CATH (N/A)  SURGEON:  Surgeon(s) and Role:    * Nestor Lewandowsky, MD - Primary  ASSISTANTS: none  ANESTHESIA:   general  DICTATION:   The patient was brought to the operating suite and placed in the supine position. Using the ultrasound machine the right IJ was identified and suitable for cannulation. The patient was then prepped and draped in usual sterile fashion. The Right IJ was percutaneously catheterized. A wire was placed into the venous system under fluoroscopic guidance. An appropriate site was selected on the chest wall and a Port-A-Cath pocket was created. The catheter was tunneled from the port site up to the insertion site. The catheter was then inserted through a peel-away sheath and positioned at the appropriate level in the superior vena cava. The catheter was then assembled and aspirated and flushed easily. It was then secured to the anterior chest wall with interrupted Prolene sutures. The catheter was flushed one last time and the wounds were then closed. The subcutaneous tissues were closed with running absorbable sutures and the skin with nylon. Sterile dressings were applied. Patient was then transported to the recovery room in stable condition.  All sponge needle and instrument counts were correct as reported to me at the end of the case.

## 2014-05-20 NOTE — Anesthesia Procedure Notes (Signed)
Procedure Name: LMA Insertion Date/Time: 05/20/2014 1:44 PM Performed by: Delaney Meigs Pre-anesthesia Checklist: Patient identified, Emergency Drugs available, Suction available, Patient being monitored and Timeout performed Patient Re-evaluated:Patient Re-evaluated prior to inductionOxygen Delivery Method: Circle system utilized Preoxygenation: Pre-oxygenation with 100% oxygen Intubation Type: IV induction Ventilation: Mask ventilation without difficulty LMA: LMA inserted LMA Size: 4.5 Number of attempts: 1 Tube secured with: Tape Dental Injury: Teeth and Oropharynx as per pre-operative assessment

## 2014-05-20 NOTE — Discharge Instructions (Addendum)
Implanted Port Insertion, Care After Refer to this sheet in the next few weeks. These instructions provide you with information on caring for yourself after your procedure. Your health care provider may also give you more specific instructions. Your treatment has been planned according to current medical practices, but problems sometimes occur. Call your health care provider if you have any problems or questions after your procedure. WHAT TO EXPECT AFTER THE PROCEDURE After your procedure, it is typical to have the following:   Discomfort at the port insertion site. Ice packs to the area will help.  Bruising on the skin over the port. This will subside in 3-4 days. HOME CARE INSTRUCTIONS  After your port is placed, you will get a manufacturer's information card. The card has information about your port. Keep this card with you at all times.   Know what kind of port you have. There are many types of ports available.   Wear a medical alert bracelet in case of an emergency. This can help alert health care workers that you have a port.   The port can stay in for as long as your health care provider believes it is necessary.   A home health care nurse may give medicines and take care of the port.   You or a family member can get special training and directions for giving medicine and taking care of the port at home.  SEEK MEDICAL CARE IF:   Your port does not flush or you are unable to get a blood return.   You have a fever or chills. SEEK IMMEDIATE MEDICAL CARE IF:  You have new fluid or pus coming from your incision.   You notice a bad smell coming from your incision site.   You have swelling, pain, or more redness at the incision or port site.   You have chest pain or shortness of breath. Document Released: 10/16/2012 Document Revised: 12/31/2012 Document Reviewed: 10/16/2012 [facility] Patient Information 2015 [place], [place]. This information is not intended to replace  advice given to you by your health care provider. Make sure you discuss any questions you have with your health care provider. General Anesthesia General anesthesia is a sleep-like state of non-feeling produced by medicines (anesthetics). General anesthesia prevents you from being alert and feeling pain during a medical procedure. Your caregiver may recommend general anesthesia if your procedure:  Is long.  Is painful or uncomfortable.  Would be frightening to see or hear.  Requires you to be still.  Affects your breathing.  Causes significant blood loss. LET YOUR CAREGIVER KNOW ABOUT:  Allergies to food or medicine.  Medicines taken, including vitamins, herbs, eyedrops, over-the-counter medicines, and creams.  Use of steroids (by mouth or creams).  Previous problems with anesthetics or numbing medicines, including problems experienced by relatives.  History of bleeding problems or blood clots.  Previous surgeries and types of anesthetics received.  Possibility of pregnancy, if this applies.  Use of cigarettes, alcohol, or illegal drugs.  Any health condition(s), especially diabetes, sleep apnea, and high blood pressure. RISKS AND COMPLICATIONS General anesthesia rarely causes complications. However, if complications do occur, they can be life threatening. Complications include:  A lung infection.  A stroke.  A heart attack.  Waking up during the procedure. When this occurs, the patient may be unable to move and communicate that he or she is awake. The patient may feel severe pain. Older adults and adults with serious medical problems are more likely to have complications than adults  who are young and healthy. Some complications can be prevented by answering all of your caregiver's questions thoroughly and by following all pre-procedure instructions. It is important to tell your caregiver if any of the pre-procedure instructions, especially those related to diet, were not  followed. Any food or liquid in the stomach can cause problems when you are under general anesthesia. BEFORE THE PROCEDURE  Ask your caregiver if you will have to spend the night at the hospital. If you will not have to spend the night, arrange to have an adult drive you and stay with you for 24 hours.  Follow your caregiver's instructions if you are taking dietary supplements or medicines. Your caregiver may tell you to stop taking them or to reduce your dosage.  Do not smoke for as long as possible before your procedure. If possible, stop smoking 3-6 weeks before the procedure.  Do not take new dietary supplements or medicines within 1 week of your procedure unless your caregiver approves them.  Do not eat within 8 hours of your procedure or as directed by your caregiver. Drink only clear liquids, such as water, black coffee (without milk or cream), and fruit juices (without pulp).  Do not drink within 3 hours of your procedure or as directed by your caregiver.  You may brush your teeth on the morning of the procedure, but make sure to spit out the toothpaste and water when finished. PROCEDURE  You will receive anesthetics through a mask, through an intravenous (IV) access tube, or through both. A doctor who specializes in anesthesia (anesthesiologist) or a nurse who specializes in anesthesia (nurse anesthetist) or both will stay with you throughout the procedure to make sure you remain unconscious. He or she will also watch your blood pressure, pulse, and oxygen levels to make sure that the anesthetics do not cause any problems. Once you are asleep, a breathing tube or mask may be used to help you breathe. AFTER THE PROCEDURE You will wake up after the procedure is complete. You may be in the room where the procedure was performed or in a recovery area. You may have a sore throat if a breathing tube was used. You may also  feel:  Dizzy.  Weak.  Drowsy.  Confused.  Nauseous.  Cold. These are all normal responses and can be expected to last for up to 24 hours after the procedure is complete. A caregiver will tell you when you are ready to go home. This will usually be when you are fully awake and in stable condition. Document Released: 04/04/2007 Document Revised: 05/12/2013 Document Reviewed: 04/26/2011 [facility] Patient Information 2015 [place], [place]. This information is not intended to replace advice given to you by your health care provider. Make sure you discuss any questions you have with your health care provider. General Anesthesia, Care After Refer to this sheet in the next few weeks. These instructions provide you with information on caring for yourself after your procedure. Your health care provider may also give you more specific instructions. Your treatment has been planned according to current medical practices, but problems sometimes occur. Call your health care provider if you have any problems or questions after your procedure. WHAT TO EXPECT AFTER THE PROCEDURE After the procedure, it is typical to experience:  Sleepiness.  Nausea and vomiting. HOME CARE INSTRUCTIONS  For the first 24 hours after general anesthesia:  Have a responsible person with you.  Do not drive a car. If you are alone, do not take public  transportation.  Do not drink alcohol.  Do not take medicine that has not been prescribed by your health care provider.  Do not sign important papers or make important decisions.  You may resume a normal diet and activities as directed by your health care provider.  Change bandages (dressings) as directed.  If you have questions or problems that seem related to general anesthesia, call the hospital and ask for the anesthetist or anesthesiologist on call. SEEK MEDICAL CARE IF:  You have nausea and vomiting that continue the day after anesthesia.  You develop a  rash. SEEK IMMEDIATE MEDICAL CARE IF:   You have difficulty breathing.  You have chest pain.  You have any allergic problems. Document Released: 04/03/2000 Document Revised: 12/31/2012 Document Reviewed: 07/11/2012 [facility] Patient Information 2015 [place], [place]. This information is not intended to replace advice given to you by your health care provider. Make sure you discuss any questions you have with your health care provider. Implanted Port Insertion, Care After Refer to this sheet in the next few weeks. These instructions provide you with information on caring for yourself after your procedure. Your health care provider may also give you more specific instructions. Your treatment has been planned according to current medical practices, but problems sometimes occur. Call your health care provider if you have any problems or questions after your procedure. WHAT TO EXPECT AFTER THE PROCEDURE After your procedure, it is typical to have the following:   Discomfort at the port insertion site. Ice packs to the area will help.  Bruising on the skin over the port. This will subside in 3-4 days. HOME CARE INSTRUCTIONS  After your port is placed, you will get a manufacturer's information card. The card has information about your port. Keep this card with you at all times.   Know what kind of port you have. There are many types of ports available.   Wear a medical alert bracelet in case of an emergency. This can help alert health care workers that you have a port.   The port can stay in for as long as your health care provider believes it is necessary.   A home health care nurse may give medicines and take care of the port.   You or a family member can get special training and directions for giving medicine and taking care of the port at home.  SEEK MEDICAL CARE IF:   Your port does not flush or you are unable to get a blood return.   You have a fever or chills. SEEK IMMEDIATE  MEDICAL CARE IF:  You have new fluid or pus coming from your incision.   You notice a bad smell coming from your incision site.   You have swelling, pain, or more redness at the incision or port site.   You have chest pain or shortness of breath. Document Released: 10/16/2012 Document Revised: 12/31/2012 Document Reviewed: 10/16/2012 [facility] Patient Information 2015 [place], [place]. This information is not intended to replace advice given to you by your health care provider. Make sure you discuss any questions you have with your health care provider. MAY REMOVE DRESSING ON  Saturday AND BATHE   PAT DRYGeneral Anesthesia, Care After Refer to this sheet in the next few weeks. These instructions provide you with information on caring for yourself after your procedure. Your health care provider may also give you more specific instructions. Your treatment has been planned according to current medical practices, but problems sometimes occur. Call your health  care provider if you have any problems or questions after your procedure. WHAT TO EXPECT AFTER THE PROCEDURE After the procedure, it is typical to experience:  Sleepiness.  Nausea and vomiting. HOME CARE INSTRUCTIONS  For the first 24 hours after general anesthesia:  Have a responsible person with you.  Do not drive a car. If you are alone, do not take public transportation.  Do not drink alcohol.  Do not take medicine that has not been prescribed by your health care provider.  Do not sign important papers or make important decisions.  You may resume a normal diet and activities as directed by your health care provider.  Change bandages (dressings) as directed.  If you have questions or problems that seem related to general anesthesia, call the hospital and ask for the anesthetist or anesthesiologist on call. SEEK MEDICAL CARE IF:  You have nausea and vomiting that continue the day after anesthesia.  You develop a  rash. SEEK IMMEDIATE MEDICAL CARE IF:   You have difficulty breathing.  You have chest pain.  You have any allergic problems. Document Released: 04/03/2000 Document Revised: 12/31/2012 Document Reviewed: 07/11/2012 [facility] Patient Information 2015 [place], [place]. This information is not intended to replace advice given to you by your health care provider. Make sure you discuss any questions you have with your health care provider. General Anesthesia, Care After Refer to this sheet in the next few weeks. These instructions provide you with information on caring for yourself after your procedure. Your health care provider may also give you more specific instructions. Your treatment has been planned according to current medical practices, but problems sometimes occur. Call your health care provider if you have any problems or questions after your procedure. WHAT TO EXPECT AFTER THE PROCEDURE After the procedure, it is typical to experience:  Sleepiness.  Nausea and vomiting. HOME CARE INSTRUCTIONS  For the first 24 hours after general anesthesia:  Have a responsible person with you.  Do not drive a car. If you are alone, do not take public transportation.  Do not drink alcohol.  Do not take medicine that has not been prescribed by your health care provider.  Do not sign important papers or make important decisions.  You may resume a normal diet and activities as directed by your health care provider.  Change bandages (dressings) as directed.  If you have questions or problems that seem related to general anesthesia, call the hospital and ask for the anesthetist or anesthesiologist on call. SEEK MEDICAL CARE IF:  You have nausea and vomiting that continue the day after anesthesia.  You develop a rash. SEEK IMMEDIATE MEDICAL CARE IF:   You have difficulty breathing.  You have chest pain.  You have any allergic problems. Document Released: 04/03/2000 Document Revised:  12/31/2012 Document Reviewed: 07/11/2012 [facility] Patient Information 2015 [place], [place]. This information is not intended to replace advice given to you by your health care provider. Make sure you discuss any questions you have with your health care provider.

## 2014-05-20 NOTE — Anesthesia Postprocedure Evaluation (Signed)
  Anesthesia Post-op Note  Patient: Ivan Beasley  Procedure(s) Performed: Procedure(s): INSERTION PORT-A-CATH (N/A)  Anesthesia type:General  Patient location: PACU  Post pain: Pain level controlled  Post assessment: Post-op Vital signs reviewed, Patient's Cardiovascular Status Stable, Respiratory Function Stable, Patent Airway and No signs of Nausea or vomiting  Post vital signs: Reviewed and stable  Last Vitals:  Filed Vitals:   05/20/14 1621  BP: 143/63  Pulse: 66  Temp:   Resp: 12    Level of consciousness: awake, alert  and patient cooperative  Complications: No apparent anesthesia complications

## 2014-05-20 NOTE — Transfer of Care (Signed)
Immediate Anesthesia Transfer of Care Note  Patient: Ivan Beasley  Procedure(s) Performed: Procedure(s): INSERTION PORT-A-CATH (N/A)  Patient Location: PACU  Anesthesia Type:General  Level of Consciousness: sedated  Airway & Oxygen Therapy: Patient Spontanous Breathing and Patient connected to face mask oxygen  Post-op Assessment: Report given to RN, Post -op Vital signs reviewed and stable and Patient moving all extremities X 4  Post vital signs: Reviewed and stable  Last Vitals:  Filed Vitals:   05/20/14 1445  BP:   Pulse:   Temp: 36.9 C  Resp:     Complications: No apparent anesthesia complications

## 2014-05-20 NOTE — OR Nursing (Signed)
5000units heparin / 574m NaCl flush

## 2014-05-20 NOTE — H&P (View-Only) (Signed)
[place] [facility] [place]   Name: Ivan Beasley Date: 05/14/2014 MRN: [id number] DOB: 04-29-1940    REFERRING PHYSICIAN: Albina Billet, MD  REASON FOR REFERRAL: Insertion of Port-A-Cath   HISTORY OF PRESENT ILLNESS:Ivan Beasley is a 74 y.o. male who presents today for insertion of a Port-A-Cath. His history begins several weeks ago when he experienced a cough which was persistent. He also had an increase in his shortness of breath. He presented to his primary care physician Dr. Benita Stabile. Dr. Hall Busing referred the patient to Dr. Raul Del. Dr. Raul Del performed a chest x-ray and treated the patient with antitussives. The chest x-ray was abnormal and this led to a CT scan. I have independently reviewed the CT scan. This revealed a left upper lobe mass with subcarinal adenopathy. The patient had a PET scan done which revealed increased uptake in the left hilum as well as in the subcarinal space. He underwent bronchoscopy last week by Dr. 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 The patient states that he has not had any hemoptysis. His shortness of breath is improved after the bronchoscopy. His cough has also improved with the introduction of antitussives. He does have a history of smoking but quit in 2001.  PAST MEDICAL HISTORY:  has a past medical history of Coronary artery disease; Myocardial infarction (2001); Pneumonia (2000); Arthritis; Chronic back pain; Bruises easily; GERD (gastroesophageal reflux disease); Blood transfusion (2001); Diabetes mellitus; Impaired hearing; and Macular degeneration.    PAST SURGICAL HISTORY: Past Surgical History  Procedure Laterality Date  . 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 Cardiac catheterization  2001  . Tonsillectomy      as a child  . Colonoscopy    . Cataract extraction      bilateral  . Lumbar laminectomy/decompression  microdiscectomy  12/19/2010    Procedure: LUMBAR LAMINECTOMY/DECOMPRESSION MICRODISCECTOMY;  Surgeon: Elaina Hoops;  Location: [place] [facility];  Service: Neurosurgery;  Laterality: N/A;  Lumbar three-four, four-five decompressive lumbar laminectomy    ALLERGIES: Review of patient's allergies indicates no known allergies.  SOCIAL HISTORY:  reports that he has quit smoking. His smoking use included Cigarettes. He quit after 40 years of use. He does not have any smokeless tobacco history on file. He reports that he does not drink alcohol or use illicit drugs.  FAMILY HISTORY: family history is negative for Anesthesia problems, Hypotension, Malignant hyperthermia, and Pseudochol deficiency.  REVIEW OF SYSTEMS: A comprehensive review of systems was negative except for: Hearing difficulty, frequent cough, shortness of breath, knee pain, all others are negative  PHYSICAL EXAM:  BP 126/73 mmHg  Pulse 76  Temp(Src) 97 F (36.1 C) (Tympanic)  Resp 76  Ht 6' (1.829 m)  Wt 101.152 kg (223 lb)  BMI 30.24 kg/m2  SpO2 100% CONSTITUTIONAL:  Pleasant, well-developed, well-nourished, and in no acute distress. EYES: Pupils equal and reactive to light, Sclera non-icteric EARS, NOSE, MOUTH AND THROAT:  The oropharynx was clear.  Dentition is absent.  Oral mucosa pink and moist. LYMPH NODES:  Lymph nodes in the neck and axillae were normal RESPIRATORY:  Lungs were clear.  Normal respiratory effort without pathologic use of accessory muscles of respiration CARDIOVASCULAR: Heart was regular without murmurs.  There were no carotid bruits.  Well healed median sternotomy scar. GI: The abdomen was soft, nontender, and nondistended. There were no palpable masses. There was no hepatosplenomegaly.  There were normal bowel sounds in all quadrants. GU:  Rectal deferred.   MUSCULOSKELETAL:  Normal muscle strength and tone.  No clubbing or cyanosis.   SKIN:  There were no pathologic skin lesions.  There were no nodules on  palpation.  Sternal scar normal. NEUROLOGIC:  Sensation is normal.  Cranial nerves are grossly intact. PSYCH:  Oriented to person, place and time.  Mood and affect are normal.    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 I do not believe that he is a surgical candidate. I did discuss with him in great detail the indications and risks of Port-A-Cath placement. Risks of bleeding, infection, pneumothorax and death were all reviewed. He is in agreement and would like to proceed.  PLAN: I will arrange through my office the Port-A-Cath placement. I have explained to the patient I would like him to withhold his aspirin and Naprosyn. He is agreeable.  All questions were answered. The patient knows to call the clinic with any problems, questions, or concerns.

## 2014-05-20 NOTE — Interval H&P Note (Signed)
History and Physical Interval Note:  05/20/2014 1:11 PM  Ivan Beasley  has presented today for surgery, with the diagnosis of lung cancer  The various methods of treatment have been discussed with the patient and family. After consideration of risks, benefits and other options for treatment, the patient has consented to  Procedure(s): INSERTION PORT-A-CATH (N/A) as a surgical intervention .  The patient's history has been reviewed, patient examined, no change in status, stable for surgery.  I have reviewed the patient's chart and labs.  Questions were answered to the patient's satisfaction.     Ivan Beasley

## 2014-05-21 ENCOUNTER — Other Ambulatory Visit: Payer: Self-pay | Admitting: *Deleted

## 2014-05-21 ENCOUNTER — Encounter: Payer: Self-pay | Admitting: Radiation Oncology

## 2014-05-21 ENCOUNTER — Ambulatory Visit
Admission: RE | Admit: 2014-05-21 | Discharge: 2014-05-21 | Disposition: A | Discharge status: Home / Self Care | Payer: Medicare HMO | Source: Ambulatory Visit | Attending: Radiation Oncology | Admitting: Radiation Oncology

## 2014-05-21 VITALS — BP 138/63 | HR 68 | Temp 96.2°F | Resp 20 | Wt 222.2 lb

## 2014-05-21 DIAGNOSIS — C3402 Malignant neoplasm of left main bronchus: Secondary | ICD-10-CM

## 2014-05-21 DIAGNOSIS — C3412 Malignant neoplasm of upper lobe, left bronchus or lung: Secondary | ICD-10-CM | POA: Insufficient documentation

## 2014-05-21 MED ORDER — LIDOCAINE-PRILOCAINE 2.5-2.5 % EX CREA: TOPICAL_CREAM | CUTANEOUS | Status: DC

## 2014-05-21 MED ORDER — DEXAMETHASONE 4 MG PO TABS: 8.0000 mg | ORAL_TABLET | Freq: Two times a day (BID) | ORAL | Status: DC

## 2014-05-21 MED ORDER — ONDANSETRON HCL 8 MG PO TABS: 8.0000 mg | ORAL_TABLET | Freq: Two times a day (BID) | ORAL | Status: DC

## 2014-05-21 NOTE — Consult Note (Signed)
Radiation Oncology NEW PATIENT EVALUATION  Name: Ivan Beasley  MRN: [id number]  Date:   05/21/2014     DOB: 08/17/1940   This 74 y.o. 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  REFERRING PHYSICIAN: Albina Billet, MD  CHIEF COMPLAINT:  Chief Complaint  Patient presents with  . Lung Cancer    Pt is here for initial evaluation of lung cancer.     DIAGNOSIS: The primary encounter diagnosis was Malignant neoplasm of upper lobe of left lung. A diagnosis of Malignant neoplasm of hilus of left lung was also pertinent to this visit.   PREVIOUS INVESTIGATIONS:  PET CT scan and CT scans reviewed Pathology report reviewed Clinical notes reviewed  HPI: Patient is a 74 year old male who is had a chronic nonproductive cough with no hemoptysis for approximately one year. Recently was noted to have a mass in the left hilar region PET CT scan confirmed hypermetabolic activity in a 4.3 cm left perihilar mass with also sub-carinal lymphadenopathy which was hypermetabolic. No disease outside of the chest was noted. Patient underwent endoscopic ultrasound with tissue positive for squamous cell carcinoma. He's been seen by medical oncology is now referred to radiation oncology for opinion. Patient is status post coronary artery bypass graft 4 vessels in 2001. He specifically denies hemoptysis marked shortness of breath or dyspnea on exertion.  PLANNED TREATMENT REGIMEN: Concurrent chemotherapy and radiation therapy with curative intent  PAST MEDICAL HISTORY:  has a past medical history of Coronary artery disease; Pneumonia (2000); Arthritis; Chronic back pain; Bruises easily; GERD (gastroesophageal reflux disease); Blood transfusion (2001); Diabetes mellitus; Impaired hearing; Macular degeneration; and Myocardial infarction (2001).    PAST SURGICAL HISTORY:  Past Surgical  History  Procedure Laterality Date  . 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 Cardiac catheterization  2001  . Tonsillectomy      as a child  . Colonoscopy    . Cataract extraction      bilateral  . Lumbar laminectomy/decompression microdiscectomy  12/19/2010    Procedure: LUMBAR LAMINECTOMY/DECOMPRESSION MICRODISCECTOMY;  Surgeon: Elaina Hoops;  Location: [facility];  Service: Neurosurgery;  Laterality: N/A;  Lumbar three-four, four-five decompressive lumbar laminectomy  . Joint replacement  right tkr  . Eye surgery      cataracts  . Back surgery      l4 5    FAMILY HISTORY: family history is negative for Anesthesia problems, Hypotension, Malignant hyperthermia, and Pseudochol deficiency.  SOCIAL HISTORY:  reports that he quit smoking about 15 years ago. His smoking use included Cigarettes. He quit after 40 years of use. He has never used smokeless tobacco. He reports that he does not drink alcohol or use illicit drugs.  ALLERGIES: Review of patient's allergies indicates no known allergies.  MEDICATIONS:  Current Outpatient Prescriptions  Medication Sig Dispense Refill  . albuterol (PROVENTIL HFA;VENTOLIN HFA) 108 (90 BASE) MCG/ACT inhaler Inhale 2 puffs into the lungs as needed for wheezing or shortness of breath.     Marland Kitchen aspirin 81 MG tablet     . benzonatate (TESSALON) 100 MG capsule Take 1 capsule (100 mg total) by mouth 3 (three) times daily as needed for cough. 60 capsule 0  . beta carotene w/minerals (OCUVITE) tablet Take 1 tablet by mouth 2 (two) times daily.    . Blood Glucose Calibration (TRUETEST CONTROL LEVEL 1) LIQD     .  CHERATUSSIN AC 100-10 MG/5ML syrup     . glimepiride (AMARYL) 1 MG tablet     . glucose blood test strip     . HYDROcodone-acetaminophen (NORCO/VICODIN) 5-325 MG per tablet Take by mouth.    Elmore Guise Devices (TRUEDRAW LANCING DEVICE) MISC     . magnesium oxide (MAG-OX) 400 MG tablet Take 400 mg by mouth daily.      . metFORMIN  (GLUCOPHAGE) 500 MG tablet Take 500 mg by mouth daily. Take '1000mg'$  po in morning, take '500mg'$  po at bedtime    . metoprolol (LOPRESSOR) 50 MG tablet 50 mg 2 (two) times daily.    . naproxen (NAPROSYN) 500 MG tablet     . omeprazole (PRILOSEC) 40 MG capsule Take 40 mg by mouth daily as needed. For acid reflux      . oxyCODONE-acetaminophen (PERCOCET) 5-325 MG per tablet Take 1 tablet by mouth every 6 (six) hours as needed. For pain     . predniSONE (DELTASONE) 10 MG tablet 5 tabs day 1, 4 tabs day 2, 3 tabs day 3, 2 tabs day 4, 1 tabs day 5 then stop 15 tablet 0  . simvastatin (ZOCOR) 40 MG tablet Take 40 mg by mouth at bedtime.      . SYMBICORT 160-4.5 MCG/ACT inhaler   12  . traMADol (ULTRAM) 50 MG tablet Take 50 mg by mouth every 6 (six) hours as needed. Maximum dose= 8 tablets per day for pain     . TRUEPLUS LANCETS 28G MISC     . Alcohol Swabs (B-D SINGLE USE SWABS REGULAR) PADS     . ANORO ELLIPTA 62.5-25 MCG/INH AEPB   11  . dexamethasone (DECADRON) 4 MG tablet Take 2 tablets (8 mg total) by mouth 2 (two) times daily with a meal. Start the day after chemotherapy for 3 days. 30 tablet 1  . lidocaine-prilocaine (EMLA) cream Apply to affected area once 30 g 3  . ondansetron (ZOFRAN) 8 MG tablet Take 1 tablet (8 mg total) by mouth 2 (two) times daily. Start the day after chemo for 3 days. Then take as needed for nausea or vomiting. 30 tablet 1   No current facility-administered medications for this encounter.   Facility-Administered Medications Ordered in Other Encounters  Medication Dose Route Frequency Provider Last Rate Last Dose  . lidocaine-prilocaine (EMLA) cream   Topical Once Evlyn Kanner, NP        ECOG PERFORMANCE STATUS:  0 - Asymptomatic  REVIEW OF SYSTEMS: Except for chronic cough an approximate 5 pound weight loss Patient denies any weight loss, fatigue, weakness, fever, chills or night sweats. Patient denies any loss of vision, blurred vision. Patient denies any ringing   of the ears or hearing loss. No irregular heartbeat. Patient denies heart murmur or history of fainting. Patient denies any chest pain or pain radiating to her upper extremities. Patient denies any shortness of breath, difficulty breathing at night, cough or hemoptysis. Patient denies any swelling in the lower legs. Patient denies any nausea vomiting, vomiting of blood, or coffee ground material in the vomitus. Patient denies any stomach pain. Patient states has had normal bowel movements no significant constipation or diarrhea. Patient denies any dysuria, hematuria or significant nocturia. Patient denies any problems walking, swelling in the joints or loss of balance. Patient denies any skin changes, loss of hair or loss of weight. Patient denies any excessive worrying or anxiety or significant depression. Patient denies any problems with insomnia. Patient denies excessive thirst, polyuria,  polydipsia. Patient denies any swollen glands, patient denies easy bruising or easy bleeding. Patient denies any recent infections, allergies or URI. Patient "s visual fields have not changed significantly in recent time.    PHYSICAL EXAM: BP 138/63 mmHg  Pulse 68  Temp(Src) 96.2 F (35.7 C)  Resp 20  Wt 222 lb 3.6 oz (100.8 kg) Well-developed elderly male in NAD wears hearing aids bilaterally. He has recent port placed in his right anterior chest wall. Lungs are clear to A&P cardiac examination shows regular rate and rhythm. No supraclavicular or axillary adenopathy is appreciated. Well-developed well-nourished patient in NAD. HEENT reveals PERLA, EOMI, discs not visualized.  Oral cavity is clear. No oral mucosal lesions are identified. Neck is clear without evidence of cervical or supraclavicular adenopathy. Lungs are clear to A&P. Cardiac examination is essentially unremarkable with regular rate and rhythm without murmur rub or thrill. Abdomen is benign with no organomegaly or masses noted. Motor sensory and DTR  levels are equal and symmetric in the upper and lower extremities. Cranial nerves II through XII are grossly intact. Proprioception is intact. No peripheral adenopathy or edema is identified. No motor or sensory levels are noted. Crude visual fields are within normal range.   LABORATORY DATA:  Results for orders placed or performed during the hospital encounter of 05/20/14 (from the past 72 hour(s))  Glucose, capillary     Status: Abnormal   Collection Time: 05/20/14 12:31 PM  Result Value Ref Range   Glucose-Capillary 256 (H) 70 - 99 mg/dL  Glucose, capillary     Status: Abnormal   Collection Time: 05/20/14  2:48 PM  Result Value Ref Range   Glucose-Capillary 297 (H) 70 - 99 mg/dL     RADIOLOGY RESULTS: Dg Chest 1 View  05/20/2014   CLINICAL DATA:  74 year old male right porta cath placement. Lung cancer. Initial encounter.  EXAM: CHEST  1 VIEW  COMPARISON:  PET-CT 04/2014 and earlier.  FINDINGS: Portable AP upright view at 1504 hrs. Right chest IJ approach porta cath in place. Catheter tip at the level of the cavoatrial junction. No pneumothorax.  Stable cardiac size and mediastinal contours. Sequelae of CABG. Confluent opacity at the left lung base appears increased from prior studies although confluent lingula atelectasis was present in April. This may in part reflect a pleural effusion. No pulmonary edema. The right lung remains clear.  IMPRESSION: 1. Right IJ porta cath placed with no adverse features. 2. Worsening ventilation at the left lung base may reflect post obstructive changes and/or new pleural effusion in this setting.   Electronically Signed   By: Genevie Ann M.D.   On: 05/20/2014 15:20   Dg C-arm 1-60 Min-no Report  05/20/2014   CLINICAL DATA: port-a-cath insertion   C-ARM 1-60 MINUTES  Fluoroscopy was utilized by the requesting physician.  No radiographic  interpretation.     IMPRESSION: Stage IIIa squamous cell carcinoma of the left lung in 74 year old male for concurrent  chemotherapy and radiation therapy  PLAN: At this time I discussed the case personally with medical oncology. Would plan on delivering 6000 cGy over 6 weeks to his left hilar mass as well as a subcarinal hypermetabolic node. I concerned close proximity to his esophagus as well as heart and believe IMRT would significantly less than the overall dose to both his esophagus and heart which we will confirm at the time of simulation and treatment planning. Risks and benefits of treatment including possible radiation esophagitis, loss of normal lung volume, fatigue, alteration  of blood counts, increased cough, skin reaction all were described in detail to the patient and his wife both seem to comprehend my treatment plan well. I have set up and ordered CT simulation early next week. We will also coordinate his chemotherapy with medical oncology.  I would like to take this opportunity for allowing me to participate in the care of your patient.Armstead Peaks., MD

## 2014-05-24 ENCOUNTER — Encounter: Payer: Self-pay | Admitting: Cardiothoracic Surgery

## 2014-05-25 ENCOUNTER — Ambulatory Visit
Admission: RE | Admit: 2014-05-25 | Discharge: 2014-05-25 | Disposition: A | Discharge status: Home / Self Care | Payer: Medicare HMO | Source: Ambulatory Visit | Attending: Radiation Oncology | Admitting: Radiation Oncology

## 2014-05-25 DIAGNOSIS — Z510 Encounter for antineoplastic radiation therapy: Secondary | ICD-10-CM | POA: Diagnosis present

## 2014-05-25 DIAGNOSIS — C3412 Malignant neoplasm of upper lobe, left bronchus or lung: Secondary | ICD-10-CM | POA: Diagnosis not present

## 2014-05-27 ENCOUNTER — Inpatient Hospital Stay (HOSPITAL_BASED_OUTPATIENT_CLINIC_OR_DEPARTMENT_OTHER): Payer: Medicare HMO | Admitting: Oncology

## 2014-05-27 ENCOUNTER — Other Ambulatory Visit: Payer: Self-pay | Admitting: Oncology

## 2014-05-27 ENCOUNTER — Encounter: Payer: Self-pay | Admitting: Oncology

## 2014-05-27 ENCOUNTER — Inpatient Hospital Stay: Payer: Medicare HMO

## 2014-05-27 VITALS — BP 156/82 | HR 69

## 2014-05-27 VITALS — BP 131/71 | HR 74 | Temp 96.7°F | Wt 223.8 lb

## 2014-05-27 DIAGNOSIS — Z5111 Encounter for antineoplastic chemotherapy: Secondary | ICD-10-CM | POA: Diagnosis not present

## 2014-05-27 DIAGNOSIS — I2510 Atherosclerotic heart disease of native coronary artery without angina pectoris: Secondary | ICD-10-CM

## 2014-05-27 DIAGNOSIS — D590 Drug-induced autoimmune hemolytic anemia: Secondary | ICD-10-CM

## 2014-05-27 DIAGNOSIS — C3402 Malignant neoplasm of left main bronchus: Secondary | ICD-10-CM

## 2014-05-27 DIAGNOSIS — C3412 Malignant neoplasm of upper lobe, left bronchus or lung: Secondary | ICD-10-CM | POA: Diagnosis not present

## 2014-05-27 DIAGNOSIS — Z87891 Personal history of nicotine dependence: Secondary | ICD-10-CM

## 2014-05-27 DIAGNOSIS — Z79899 Other long term (current) drug therapy: Secondary | ICD-10-CM

## 2014-05-27 DIAGNOSIS — K219 Gastro-esophageal reflux disease without esophagitis: Secondary | ICD-10-CM

## 2014-05-27 DIAGNOSIS — I252 Old myocardial infarction: Secondary | ICD-10-CM

## 2014-05-27 DIAGNOSIS — M1990 Unspecified osteoarthritis, unspecified site: Secondary | ICD-10-CM

## 2014-05-27 DIAGNOSIS — Z951 Presence of aortocoronary bypass graft: Secondary | ICD-10-CM

## 2014-05-27 DIAGNOSIS — E119 Type 2 diabetes mellitus without complications: Secondary | ICD-10-CM

## 2014-05-27 LAB — COMPREHENSIVE METABOLIC PANEL
ALT: 30 U/L (ref 17–63)
ANION GAP: 7 (ref 5–15)
AST: 37 U/L (ref 15–41)
Albumin: 3.4 g/dL — ABNORMAL LOW (ref 3.5–5.0)
Alkaline Phosphatase: 95 U/L (ref 38–126)
BILIRUBIN TOTAL: 0.3 mg/dL (ref 0.3–1.2)
BUN: 28 mg/dL — ABNORMAL HIGH (ref 6–20)
CHLORIDE: 106 mmol/L (ref 101–111)
CO2: 22 mmol/L (ref 22–32)
Calcium: 8.9 mg/dL (ref 8.9–10.3)
Creatinine, Ser: 1.06 mg/dL (ref 0.61–1.24)
GFR calc Af Amer: >60 mL/min (ref >60)
GFR calc non Af Amer: >60 mL/min (ref >60)
Glucose, Bld: 172 mg/dL — ABNORMAL HIGH (ref 65–99)
Potassium: 4.7 mmol/L (ref 3.5–5.1)
SODIUM: 135 mmol/L (ref 135–145)
Total Protein: 6.7 g/dL (ref 6.5–8.1)

## 2014-05-27 LAB — CBC WITH DIFFERENTIAL/PLATELET
BASOS ABS: 0 10*3/uL (ref 0–0.1)
Basophils Relative: 0 %
EOS ABS: 0.1 10*3/uL (ref 0–0.7)
Eosinophils Relative: 2 %
HCT: 35.5 % — ABNORMAL LOW (ref 40.0–52.0)
HEMOGLOBIN: 11.5 g/dL — AB (ref 13.0–18.0)
Lymphocytes Relative: 22 %
Lymphs Abs: 1.5 10*3/uL (ref 1.0–3.6)
MCH: 28 pg (ref 26.0–34.0)
MCHC: 32.5 g/dL (ref 32.0–36.0)
MCV: 86.1 fL (ref 80.0–100.0)
Monocytes Absolute: 0.7 10*3/uL (ref 0.2–1.0)
Monocytes Relative: 10 %
NEUTROS ABS: 4.5 10*3/uL (ref 1.4–6.5)
Neutrophils Relative %: 66 %
PLATELETS: 183 10*3/uL (ref 150–440)
RBC: 4.13 MIL/uL — ABNORMAL LOW (ref 4.40–5.90)
RDW: 16.1 % — AB (ref 11.5–14.5)
WBC: 6.8 10*3/uL (ref 3.8–10.6)

## 2014-05-27 MED ORDER — HEPARIN SOD (PORK) LOCK FLUSH 100 UNIT/ML IV SOLN
500.0000 [IU] | Freq: Once | INTRAVENOUS | Status: AC | PRN
  Administered 2014-05-27: 500 [IU]
  Filled 2014-05-27: qty 5

## 2014-05-27 MED ORDER — SODIUM CHLORIDE 0.9 % IV SOLN
Freq: Once | INTRAVENOUS | Status: AC
  Administered 2014-05-27: 10:00:00 via INTRAVENOUS
  Filled 2014-05-27: qty 250

## 2014-05-27 MED ORDER — FAMOTIDINE IN NACL 20-0.9 MG/50ML-% IV SOLN
20.0000 mg | Freq: Once | INTRAVENOUS | Status: AC
  Administered 2014-05-27: 20 mg via INTRAVENOUS
  Filled 2014-05-27: qty 50

## 2014-05-27 MED ORDER — SODIUM CHLORIDE 0.9 % IV SOLN
Freq: Once | INTRAVENOUS | Status: AC
  Administered 2014-05-27: 11:00:00 via INTRAVENOUS
  Filled 2014-05-27: qty 5

## 2014-05-27 MED ORDER — SODIUM CHLORIDE 0.9 % IV SOLN: Freq: Once | INTRAVENOUS | Status: DC

## 2014-05-27 MED ORDER — SODIUM CHLORIDE 0.9 % IV SOLN
225.0000 mg | Freq: Once | INTRAVENOUS | Status: AC
  Administered 2014-05-27: 230 mg via INTRAVENOUS
  Filled 2014-05-27: qty 23

## 2014-05-27 MED ORDER — DIPHENHYDRAMINE HCL 50 MG/ML IJ SOLN
50.0000 mg | Freq: Once | INTRAMUSCULAR | Status: AC
  Administered 2014-05-27: 50 mg via INTRAVENOUS
  Filled 2014-05-27: qty 1

## 2014-05-27 MED ORDER — PACLITAXEL CHEMO INJECTION 300 MG/50ML
45.0000 mg/m2 | Freq: Once | INTRAVENOUS | Status: AC
  Administered 2014-05-27: 102 mg via INTRAVENOUS
  Filled 2014-05-27: qty 17

## 2014-05-27 MED ORDER — PALONOSETRON HCL INJECTION 0.25 MG/5ML
0.2500 mg | Freq: Once | INTRAVENOUS | Status: AC
  Administered 2014-05-27: 0.25 mg via INTRAVENOUS
  Filled 2014-05-27: qty 5

## 2014-05-27 NOTE — Progress Notes (Signed)
[place] @ [facility] Telephone:([phone]) [phone]  Fax:([phone]) [place]: Oct 28, 1940  MR#: [id number]  EPP#:[id number]  Patient Care Team: Albina Billet, MD as PCP - General (Internal Medicine)  CHIEF COMPLAINT:  Chief Complaint  Patient presents with  . Follow-up    Oncology History   74 year old gentleman with left hilar mass and mediastinal adenopathy Chronic smoker quit smoking in 2000 Non-insulin-dependent diabetes History of coronary artery disease with status post bypass surgery done in year 2000 1.  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  Patient is starting carboplatinum and Taxol with AUC of 2 and 45 mg/m respectively concurrent with radiation therapy (May 27, 2014)     Cancer of lung    Cancer Diagnosis     Oncology Flowsheet 12/19/2010 05/20/2014 05/27/2014  Day, Cycle - - Day 1, Cycle 1  CARBOplatin (PARAPLATIN) IV - - 230 mg  dexamethasone (DECADRON) IV - - [ 12 mg ]  fosaprepitant (EMEND) IV - - [ 150 mg ]  ondansetron (ZOFRAN) IJ - - -  ondansetron (ZOFRAN) IV - - -  PACLitaxel (TAXOL) IV - - 45 mg/m2  palonosetron (ALOXI) IV - - 0.25 mg    INTERVAL HISTORY: Patient underwent EBUS and biopsy was positive for squamous cell carcinoma of lung.  Was stages IIIa disease.  Here for further follow-up and treatment consideration.  Had one episode of hemoptysis after bronchoscopy .  Patient was advised to stop taking aspirin.  Hemoptysis has resolved Patient is here to discuss the results and further planning of treatment patient's cough has improved after course of prednisone and Tessalon bowel  REVIEW OF SYSTEMS:   ROS Gen. status: Patient is alert oriented not any acute distress. Head exam was generally normal. There was no scleral icterus or corneal arcus. Mucous membranes were moist.  Lungs: Emphysematous chest occasional rhonchi.  Significant improvement in  wheezing.Cardiac exam revealed the PMI to be normally situated and sized. The rhythm was regular and no extrasystoles were noted during several minutes of auscultation. The first and second heart sounds were normal and physiologic splitting of the second heart sound was noted. There were no murmurs, rubs, clicks, or gallops.Abdominal exam revealed normal bowel sounds. The abdomen was soft, non-tender, and without masses, organomegaly, or appreciable enlargement of the abdominal aorta.Examination of the skin revealed no evidence of significant rashes, suspicious appearing nevi or other concerning lesions..  Neurologically, the patient was awake, alert, and oriented to person, place and time. There were no obvious focal neurologic abnormalities.  Lower extremity no edema. As per HPI. Otherwise, a complete review of systems is negatve.  PAST MEDICAL HISTORY: Past Medical History  Diagnosis Date  . Coronary artery disease   . Pneumonia 2000  . Arthritis   . 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 Bruises easily   . GERD (gastroesophageal reflux disease)     takes Omeprazole daily as needed for stomach pain  . Blood transfusion 2001  . Diabetes mellitus     takes Metformin daily;  . Impaired hearing     bil hearing aide  . Macular degeneration     being watched for this but hasn't been "completely" diagnosed  . Myocardial infarction 2001    PAST SURGICAL HISTORY: Past Surgical History  Procedure Laterality Date  . Coronary artery bypass  graft  2001    4 vessels  . Cardiac catheterization  2001  . Tonsillectomy      as a child  . Colonoscopy    . Cataract extraction      bilateral  . Lumbar laminectomy/decompression microdiscectomy  12/19/2010    Procedure: LUMBAR LAMINECTOMY/DECOMPRESSION MICRODISCECTOMY;  Surgeon: Elaina Hoops;  Location: [place] [facility];  Service: Neurosurgery;  Laterality: N/A;  Lumbar three-four, four-five decompressive lumbar laminectomy  . Joint replacement   right tkr  . Eye surgery      cataracts  . Back surgery      l4 5  . Portacath placement N/A 05/20/2014    Procedure: INSERTION PORT-A-CATH;  Surgeon: Nestor Lewandowsky, MD;  Location: [facility];  Service: General;  Laterality: N/A;    FAMILY HISTORY Family History  Problem Relation Age of Onset  . Anesthesia problems Neg Hx   . Hypotension Neg Hx   . Malignant hyperthermia Neg Hx   . Pseudochol deficiency Neg Hx         ADVANCED DIRECTIVES:    HEALTH MAINTENANCE: History  Substance Use Topics  . Smoking status: Former Smoker -- 40 years    Types: Cigarettes    Quit date: 05/19/1999  . Smokeless tobacco: Never Used     Comment: 2001  . Alcohol Use: No     No Known Allergies  Current Outpatient Prescriptions  Medication Sig Dispense Refill  . albuterol (PROVENTIL HFA;VENTOLIN HFA) 108 (90 BASE) MCG/ACT inhaler Inhale 2 puffs into the lungs as needed for wheezing or shortness of breath.     . Alcohol Swabs (B-D SINGLE USE SWABS REGULAR) PADS     . benzonatate (TESSALON) 100 MG capsule Take 1 capsule (100 mg total) by mouth 3 (three) times daily as needed for cough. 60 capsule 0  . Blood Glucose Calibration (TRUETEST CONTROL LEVEL 1) LIQD     . CHERATUSSIN AC 100-10 MG/5ML syrup     . dexamethasone (DECADRON) 4 MG tablet Take 2 tablets (8 mg total) by mouth 2 (two) times daily with a meal. Start the day after chemotherapy for 3 days. 30 tablet 1  . glimepiride (AMARYL) 1 MG tablet     . glucose blood test strip     . HYDROcodone-acetaminophen (NORCO/VICODIN) 5-325 MG per tablet Take by mouth.    Elmore Guise Devices (TRUEDRAW LANCING DEVICE) MISC     . lidocaine-prilocaine (EMLA) cream Apply to affected area once 30 g 3  . magnesium oxide (MAG-OX) 400 MG tablet Take 400 mg by mouth daily.      . metFORMIN (GLUCOPHAGE) 500 MG tablet Take 500 mg by mouth daily. Take '1000mg'$  po in morning, take '500mg'$  po at bedtime    . metoprolol (LOPRESSOR) 50 MG tablet 50 mg 2 (two) times daily.     Marland Kitchen omeprazole (PRILOSEC) 40 MG capsule Take 40 mg by mouth daily as needed. For acid reflux      . ondansetron (ZOFRAN) 8 MG tablet Take 1 tablet (8 mg total) by mouth 2 (two) times daily. Start the day after chemo for 3 days. Then take as needed for nausea or vomiting. 30 tablet 1  . simvastatin (ZOCOR) 40 MG tablet Take 40 mg by mouth at bedtime.      . SYMBICORT 160-4.5 MCG/ACT inhaler   12  . traMADol (ULTRAM) 50 MG tablet Take 50 mg by mouth every 6 (six) hours as needed. Maximum dose= 8 tablets per day for pain     .  TRUEPLUS LANCETS 28G MISC     . ANORO ELLIPTA 62.5-25 MCG/INH AEPB   11  . aspirin 81 MG tablet     . beta carotene w/minerals (OCUVITE) tablet Take 1 tablet by mouth 2 (two) times daily.    . naproxen (NAPROSYN) 500 MG tablet     . oxyCODONE-acetaminophen (PERCOCET) 5-325 MG per tablet Take 1 tablet by mouth every 6 (six) hours as needed. For pain     . predniSONE (DELTASONE) 10 MG tablet 5 tabs day 1, 4 tabs day 2, 3 tabs day 3, 2 tabs day 4, 1 tabs day 5 then stop (Patient not taking: Reported on 05/27/2014) 15 tablet 0   No current facility-administered medications for this visit.   Facility-Administered Medications Ordered in Other Visits  Medication Dose Route Frequency Provider Last Rate Last Dose  . heparin lock flush 100 unit/mL  500 Units Intracatheter Once PRN Forest Gleason, MD      . lidocaine-prilocaine (EMLA) cream   Topical Once Evlyn Kanner, NP        OBJECTIVE: Filed Vitals:   05/27/14 0907  BP: 131/71  Pulse: 74  Temp: 96.7 F (35.9 C)     Body mass index is 29.53 kg/(m^2).    ECOG FS:1 - Symptomatic but completely ambulatory  Physical Exam  Constitutional: No distress.  HENT:  Head: Normocephalic and atraumatic.  Right Ear: External ear normal.  Left Ear: External ear normal.  Nose: Nose normal.  Mouth/Throat: Oropharynx is clear and moist.  Eyes: Conjunctivae and EOM are normal. Pupils are equal, round, and reactive to light.  Neck:  Normal range of motion. Neck supple. No JVD present. No tracheal deviation present. No thyromegaly present.  Cardiovascular: Normal rate, normal heart sounds and intact distal pulses.  Exam reveals no gallop and no friction rub.   No murmur heard. Pulmonary/Chest: No stridor. No respiratory distress. He has no wheezes. He has no rales. He exhibits no tenderness.  Abdominal: He exhibits no distension and no mass. There is no tenderness. There is no rebound and no guarding.  Musculoskeletal: He exhibits no edema or tenderness.  Neurological: He displays normal reflexes. No cranial nerve deficit. He exhibits normal muscle tone. Coordination normal.  Skin: No rash noted. He is not diaphoretic. No erythema.  Psychiatric: Memory, affect and judgment normal.  Nursing note and vitals reviewed.    LAB RESULTS:     Component Value Date/Time   NA 135 05/27/2014 0835   NA 134* 04/30/2014 0938   K 4.7 05/27/2014 0835   K 5.0 04/30/2014 0938   CL 106 05/27/2014 0835   CL 102 04/30/2014 0938   CO2 22 05/27/2014 0835   CO2 25 04/30/2014 0938   GLUCOSE 172* 05/27/2014 0835   GLUCOSE 225* 04/30/2014 0938   BUN 28* 05/27/2014 0835   BUN 25* 04/30/2014 0938   CREATININE 1.06 05/27/2014 0835   CREATININE 1.20 04/30/2014 0938   CALCIUM 8.9 05/27/2014 0835   CALCIUM 9.2 04/30/2014 0938   PROT 6.7 05/27/2014 0835   PROT 7.5 04/30/2014 0938   ALBUMIN 3.4* 05/27/2014 0835   ALBUMIN 3.7 04/30/2014 0938   AST 37 05/27/2014 0835   AST 46* 04/30/2014 0938   ALT 30 05/27/2014 0835   ALT 40 04/30/2014 0938   ALKPHOS 95 05/27/2014 0835   ALKPHOS 104 04/30/2014 0938   BILITOT 0.3 05/27/2014 0835   GFRNONAA >60 05/27/2014 0835   GFRNONAA 59* 04/30/2014 0938   GFRAA >60 05/27/2014 0835   GFRAA >  60 04/30/2014 0938    No results found for: SPEP, UPEP  Lab Results  Component Value Date   WBC 6.8 05/27/2014   NEUTROABS 4.5 05/27/2014   HGB 11.5* 05/27/2014   HCT 35.5* 05/27/2014   MCV 86.1  05/27/2014   PLT 183 05/27/2014      Chemistry      Component Value Date/Time   NA 135 05/27/2014 0835   NA 134* 04/30/2014 0938   K 4.7 05/27/2014 0835   K 5.0 04/30/2014 0938   CL 106 05/27/2014 0835   CL 102 04/30/2014 0938   CO2 22 05/27/2014 0835   CO2 25 04/30/2014 0938   BUN 28* 05/27/2014 0835   BUN 25* 04/30/2014 0938   CREATININE 1.06 05/27/2014 0835   CREATININE 1.20 04/30/2014 0938      Component Value Date/Time   CALCIUM 8.9 05/27/2014 0835   CALCIUM 9.2 04/30/2014 0938   ALKPHOS 95 05/27/2014 0835   ALKPHOS 104 04/30/2014 0938   AST 37 05/27/2014 0835   AST 46* 04/30/2014 0938   ALT 30 05/27/2014 0835   ALT 40 04/30/2014 0938   BILITOT 0.3 05/27/2014 0835       STUDIES: Dg Chest 1 View  05/20/2014   CLINICAL DATA:  74 year old male right porta cath placement. Lung cancer. Initial encounter.  EXAM: CHEST  1 VIEW  COMPARISON:  PET-CT 04/2014 and earlier.  FINDINGS: Portable AP upright view at 1504 hrs. Right chest IJ approach porta cath in place. Catheter tip at the level of the cavoatrial junction. No pneumothorax.  Stable cardiac size and mediastinal contours. Sequelae of CABG. Confluent opacity at the left lung base appears increased from prior studies although confluent lingula atelectasis was present in April. This may in part reflect a pleural effusion. No pulmonary edema. The right lung remains clear.  IMPRESSION: 1. Right IJ porta cath placed with no adverse features. 2. Worsening ventilation at the left lung base may reflect post obstructive changes and/or new pleural effusion in this setting.   Electronically Signed   By: Genevie Ann M.D.   On: 05/20/2014 15:20   Nm Pet Nopr Skull Base To Thigh  04/29/2014   CLINICAL DATA:  Initial treatment strategy for lung cancer.  EXAM: NUCLEAR MEDICINE PET SKULL BASE TO THIGH  TECHNIQUE: Twelve point to mCi F-18 FDG was injected intravenously. Full-ring PET imaging was performed from the skull base to thigh after the  radiotracer. CT data was obtained and used for attenuation correction and anatomic localization.  FASTING BLOOD GLUCOSE:  Value: 166 mg/dl  COMPARISON:  Chest CT 04/23/2014.  FINDINGS: NECK  No hypermetabolic lymph nodes in the neck.  CHEST  Again noted is a a hypermetabolic (SUVmax = 28.3) mass in the perihilar aspect of the left upper lobe which is difficult to measure, but is estimated to measure approximately 4.3 x 2.4 cm (images 160 and 157 of series 3). There is associated a 9 mm left hilar hypermetabolic lymph node (SUVmax = 10.5), and mildly enlarged subcarinal lymph nodes measuring up to 13 mm in short axis which are hypermetabolic (SUVmax = 8.3). No contralateral hilar or paratracheal lymphadenopathy is noted. However, there are numerous nonenlarged but prominent non hypermetabolic anterior and middle mediastinal lymph nodes which are nonspecific, but conspicuous by their number. No other pulmonary nodules are noted. Some mild postobstructive changes are noted in the inferior segment of the lingula distal to the mass. Mild diffuse bronchial wall thickening with mild centrilobular and paraseptal emphysema. Heart size  is normal. There is no significant pericardial fluid, thickening or pericardial calcification. There is atherosclerosis of the thoracic aorta, the great vessels of the mediastinum and the coronary arteries, including calcified atherosclerotic plaque in the left main, left anterior descending, left circumflex and right coronary arteries. Status post median sternotomy for CABG, including LIMA to the LAD.  ABDOMEN/PELVIS  No abnormal hypermetabolic activity within the liver, pancreas, adrenal glands, or spleen. No hypermetabolic lymph nodes in the abdomen or pelvis. Diffuse low attenuation throughout the hepatic parenchyma, compatible with hepatic steatosis. Tiny calcified gallstones lying dependently in the neck of the gallbladder. No current findings to suggest an acute cholecystitis at this  time. Small diverticulum from the third portion of the duodenum incidentally noted. No surrounding inflammatory changes. 3.9 cm exophytic low-attenuation non hypermetabolic lesion in the lower pole of the left kidney, incompletely characterized on today's examination, but likely to represent a cyst. Numerous colonic diverticulae are noted, without surrounding inflammatory changes to suggest an acute diverticulitis at this time. Normal appendix. Extensive atherosclerosis throughout the abdominal and pelvic vasculature.  SKELETON  No focal hypermetabolic activity to suggest skeletal metastasis. Status post L4-L5 laminectomy. Median sternotomy wires.  IMPRESSION: 1. 4.3 x 2.4 cm perihilar mass in the left upper lobe with associated hypermetabolic left hilar and subcarinal lymphadenopathy. No evidence of distant metastatic disease in the neck, chest, abdomen or pelvis. Assuming non-small-cell in etiology, findings are compatible with T2a, N2, Mx disease (i.e., likely stage IIIA). 2. Hepatic steatosis. 3. Cholelithiasis without evidence of acute cholecystitis at this time. 4. Colonic diverticulosis without evidence of acute diverticulitis. 5. Atherosclerosis, including left main and 3 vessel coronary artery disease. Status post median sternotomy for CABG, including LIMA to the LAD. 6. Additional incidental findings, as above.   Electronically Signed   By: Vinnie Langton M.D.   On: 04/29/2014 15:18   Dg C-arm 1-60 Min-no Report  05/20/2014   CLINICAL DATA: port-a-cath insertion   C-ARM 1-60 MINUTES  Fluoroscopy was utilized by the requesting physician.  No radiographic  interpretation.     ASSESSMENT: \ 1.  Carcinoma of lung left upper lobe.  Stage IIIa squamous cell carcinoma.  Diagnosis buying EBUS. patient is unresectable based on CT and PET scan criteria.  Will opt pain opinion from thoracic surgeon.. Initiate chemotherapy also will start radiation therapy within a week  PLAN: Initiate treatment.  All  lab data has been reviewed. Informal consent has been obtained Patient family a number of questions which were answered regarding chemotherapy at goal of treatment  2. Patient expressed understanding and was in agreement with this plan. He also understands that He can call clinic at any time with any questions, concerns, or complaints.    Cancer of lung   Staging form: Lung, AJCC 7th Edition     Clinical: T2, N2, M0 - Unsigned   Forest Gleason, MD   05/27/2014 1:35 PM

## 2014-05-28 DIAGNOSIS — C3412 Malignant neoplasm of upper lobe, left bronchus or lung: Secondary | ICD-10-CM | POA: Diagnosis not present

## 2014-06-03 ENCOUNTER — Inpatient Hospital Stay: Payer: Medicare HMO

## 2014-06-03 ENCOUNTER — Ambulatory Visit
Admission: RE | Admit: 2014-06-03 | Discharge: 2014-06-03 | Disposition: A | Discharge status: Home / Self Care | Payer: Medicare HMO | Source: Ambulatory Visit | Attending: Radiation Oncology | Admitting: Radiation Oncology

## 2014-06-03 ENCOUNTER — Inpatient Hospital Stay (HOSPITAL_BASED_OUTPATIENT_CLINIC_OR_DEPARTMENT_OTHER): Payer: Medicare HMO | Admitting: Oncology

## 2014-06-03 ENCOUNTER — Telehealth: Payer: Self-pay

## 2014-06-03 VITALS — BP 155/72

## 2014-06-03 VITALS — HR 79 | Temp 97.8°F | Wt 220.9 lb

## 2014-06-03 DIAGNOSIS — C3412 Malignant neoplasm of upper lobe, left bronchus or lung: Secondary | ICD-10-CM | POA: Diagnosis not present

## 2014-06-03 DIAGNOSIS — M1990 Unspecified osteoarthritis, unspecified site: Secondary | ICD-10-CM

## 2014-06-03 DIAGNOSIS — Z87891 Personal history of nicotine dependence: Secondary | ICD-10-CM | POA: Diagnosis not present

## 2014-06-03 DIAGNOSIS — R05 Cough: Secondary | ICD-10-CM

## 2014-06-03 DIAGNOSIS — Z5111 Encounter for antineoplastic chemotherapy: Secondary | ICD-10-CM | POA: Diagnosis not present

## 2014-06-03 DIAGNOSIS — I2510 Atherosclerotic heart disease of native coronary artery without angina pectoris: Secondary | ICD-10-CM

## 2014-06-03 DIAGNOSIS — C3402 Malignant neoplasm of left main bronchus: Secondary | ICD-10-CM

## 2014-06-03 DIAGNOSIS — G8929 Other chronic pain: Secondary | ICD-10-CM

## 2014-06-03 DIAGNOSIS — K219 Gastro-esophageal reflux disease without esophagitis: Secondary | ICD-10-CM

## 2014-06-03 DIAGNOSIS — I252 Old myocardial infarction: Secondary | ICD-10-CM

## 2014-06-03 DIAGNOSIS — M4806 Spinal stenosis, lumbar region: Secondary | ICD-10-CM

## 2014-06-03 DIAGNOSIS — E119 Type 2 diabetes mellitus without complications: Secondary | ICD-10-CM

## 2014-06-03 DIAGNOSIS — Z79899 Other long term (current) drug therapy: Secondary | ICD-10-CM

## 2014-06-03 LAB — COMPREHENSIVE METABOLIC PANEL
ALBUMIN: 3.8 g/dL (ref 3.5–5.0)
ALK PHOS: 96 U/L (ref 38–126)
ALT: 61 U/L (ref 17–63)
ANION GAP: 7 (ref 5–15)
AST: 76 U/L — ABNORMAL HIGH (ref 15–41)
BUN: 35 mg/dL — ABNORMAL HIGH (ref 6–20)
CHLORIDE: 104 mmol/L (ref 101–111)
CO2: 22 mmol/L (ref 22–32)
Calcium: 8.9 mg/dL (ref 8.9–10.3)
Creatinine, Ser: 1.32 mg/dL — ABNORMAL HIGH (ref 0.61–1.24)
GFR, EST AFRICAN AMERICAN: 60 mL/min — AB (ref >60)
GFR, EST NON AFRICAN AMERICAN: 51 mL/min — AB (ref >60)
Glucose, Bld: 215 mg/dL — ABNORMAL HIGH (ref 65–99)
Potassium: 4.6 mmol/L (ref 3.5–5.1)
SODIUM: 133 mmol/L — AB (ref 135–145)
TOTAL PROTEIN: 7.3 g/dL (ref 6.5–8.1)
Total Bilirubin: 0.4 mg/dL (ref 0.3–1.2)

## 2014-06-03 LAB — CBC WITH DIFFERENTIAL/PLATELET
Basophils Absolute: 0 10*3/uL (ref 0–0.1)
Basophils Relative: 1 %
Eosinophils Absolute: 0.1 10*3/uL (ref 0–0.7)
Eosinophils Relative: 2 %
HEMATOCRIT: 36.3 % — AB (ref 40.0–52.0)
HEMOGLOBIN: 11.7 g/dL — AB (ref 13.0–18.0)
LYMPHS PCT: 18 %
Lymphs Abs: 1.4 10*3/uL (ref 1.0–3.6)
MCH: 27.8 pg (ref 26.0–34.0)
MCHC: 32.2 g/dL (ref 32.0–36.0)
MCV: 86.5 fL (ref 80.0–100.0)
MONOS PCT: 6 %
Monocytes Absolute: 0.5 10*3/uL (ref 0.2–1.0)
NEUTROS ABS: 5.8 10*3/uL (ref 1.4–6.5)
Neutrophils Relative %: 73 %
PLATELETS: 204 10*3/uL (ref 150–440)
RBC: 4.2 MIL/uL — ABNORMAL LOW (ref 4.40–5.90)
RDW: 16.1 % — ABNORMAL HIGH (ref 11.5–14.5)
WBC: 7.9 10*3/uL (ref 3.8–10.6)

## 2014-06-03 MED ORDER — HEPARIN SOD (PORK) LOCK FLUSH 100 UNIT/ML IV SOLN
500.0000 [IU] | Freq: Once | INTRAVENOUS | Status: AC | PRN
  Filled 2014-06-03: qty 5

## 2014-06-03 MED ORDER — PREDNISONE 20 MG PO TABS: 20.0000 mg | ORAL_TABLET | Freq: Every day | ORAL | Status: DC

## 2014-06-03 MED ORDER — DEXTROSE 5 % IV SOLN
45.0000 mg/m2 | Freq: Once | INTRAVENOUS | Status: AC
  Administered 2014-06-03: 102 mg via INTRAVENOUS
  Filled 2014-06-03: qty 17

## 2014-06-03 MED ORDER — SODIUM CHLORIDE 0.9 % IJ SOLN
10.0000 mL | INTRAMUSCULAR | Status: AC | PRN
  Administered 2014-06-03: 10 mL
  Filled 2014-06-03: qty 10

## 2014-06-03 MED ORDER — SODIUM CHLORIDE 0.9 % IV SOLN
Freq: Once | INTRAVENOUS | Status: AC
  Administered 2014-06-03: 13:00:00 via INTRAVENOUS
  Filled 2014-06-03: qty 5

## 2014-06-03 MED ORDER — PALONOSETRON HCL INJECTION 0.25 MG/5ML
0.2500 mg | Freq: Once | INTRAVENOUS | Status: AC
  Administered 2014-06-03: 0.25 mg via INTRAVENOUS
  Filled 2014-06-03: qty 5

## 2014-06-03 MED ORDER — MELATONIN 5 MG PO CAPS: 1.0000 | ORAL_CAPSULE | Freq: Every evening | ORAL | Status: AC | PRN

## 2014-06-03 MED ORDER — DIPHENHYDRAMINE HCL 50 MG/ML IJ SOLN
50.0000 mg | Freq: Once | INTRAMUSCULAR | Status: AC
  Administered 2014-06-03: 50 mg via INTRAVENOUS
  Filled 2014-06-03: qty 1

## 2014-06-03 MED ORDER — FAMOTIDINE IN NACL 20-0.9 MG/50ML-% IV SOLN
20.0000 mg | Freq: Once | INTRAVENOUS | Status: AC
  Administered 2014-06-03: 20 mg via INTRAVENOUS
  Filled 2014-06-03: qty 50

## 2014-06-03 MED ORDER — SODIUM CHLORIDE 0.9 % IV SOLN
190.0000 mg | Freq: Once | INTRAVENOUS | Status: AC
  Administered 2014-06-03: 190 mg via INTRAVENOUS
  Filled 2014-06-03: qty 19

## 2014-06-03 MED ORDER — SODIUM CHLORIDE 0.9 % IV SOLN: Freq: Once | INTRAVENOUS | Status: DC

## 2014-06-03 MED ORDER — SODIUM CHLORIDE 0.9 % IV SOLN
Freq: Once | INTRAVENOUS | Status: AC
  Administered 2014-06-03: 13:00:00 via INTRAVENOUS
  Filled 2014-06-03: qty 250

## 2014-06-03 NOTE — Progress Notes (Signed)
Former smoker, Quit 2001.  Does have living will.

## 2014-06-03 NOTE — Telephone Encounter (Signed)
SrCr increased to 1.32, dose changed >10% from last treatment given

## 2014-06-04 ENCOUNTER — Ambulatory Visit
Admission: RE | Admit: 2014-06-04 | Discharge: 2014-06-04 | Disposition: A | Discharge status: Home / Self Care | Payer: Medicare HMO | Source: Ambulatory Visit | Attending: Radiation Oncology | Admitting: Radiation Oncology

## 2014-06-04 DIAGNOSIS — C3412 Malignant neoplasm of upper lobe, left bronchus or lung: Secondary | ICD-10-CM | POA: Diagnosis not present

## 2014-06-05 ENCOUNTER — Ambulatory Visit
Admission: RE | Admit: 2014-06-05 | Discharge: 2014-06-05 | Disposition: A | Discharge status: Home / Self Care | Payer: Medicare HMO | Source: Ambulatory Visit | Attending: Radiation Oncology | Admitting: Radiation Oncology

## 2014-06-05 DIAGNOSIS — C3412 Malignant neoplasm of upper lobe, left bronchus or lung: Secondary | ICD-10-CM | POA: Diagnosis not present

## 2014-06-08 ENCOUNTER — Other Ambulatory Visit: Payer: Self-pay | Admitting: Oncology

## 2014-06-08 ENCOUNTER — Encounter: Payer: Self-pay | Admitting: Oncology

## 2014-06-08 NOTE — Progress Notes (Signed)
[place] @ [facility] Telephone:(336) [phone]  Fax:(336) [name]: 03-26-1940  MR#: [id number]  WPY#:[id number]  Patient Care Team: Albina Billet, MD as PCP - General (Internal Medicine)  CHIEF COMPLAINT:  Chief Complaint  Patient presents with  . Follow-up    Oncology History   74 year old gentleman with left hilar mass and mediastinal adenopathy Chronic smoker quit smoking in 2000 Non-insulin-dependent diabetes History of coronary artery disease with status post bypass surgery done in year 2000 1.  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  Patient is starting carboplatinum and Taxol with AUC of 2 and 45 mg/m respectively concurrent with radiation therapy (May 27, 2014)     Cancer of lung    Cancer Diagnosis     Oncology Flowsheet 12/19/2010 05/20/2014 05/27/2014 06/03/2014  Day, Cycle - - Day 1, Cycle 1 Day 1, Cycle 2  CARBOplatin (PARAPLATIN) IV - - 230 mg 190 mg  dexamethasone (DECADRON) IV - - [ 12 mg ] [ 12 mg ]  fosaprepitant (EMEND) IV - - [ 150 mg ] [ 150 mg ]  ondansetron (ZOFRAN) IJ - - - -  ondansetron (ZOFRAN) IV - - - -  PACLitaxel (TAXOL) IV - - 45 mg/m2 45 mg/m2  palonosetron (ALOXI) IV - - 0.25 mg 0.25 mg    INTERVAL HISTORY: Patient underwent EBUS and biopsy was positive for squamous cell carcinoma of lung.  Was stages IIIa disease.  Here for further follow-up and treatment consideration.  Had one episode of hemoptysis after bronchoscopy .  Patient was advised to stop taking aspirin.  Hemoptysis has resolved. Dry hacking cough continues . Jun 03, 2014 patient is here for ongoing evaluation and continuation of chemotherapy.  Also started radiation therapy.  Patient's cough is improved however when prednisone tapered off started having some increasing cough suppression will be started on prednisone small dose to radiation chemotherapy has some effect in  improving symptom  REVIEW OF SYSTEMS:   ROS Gen. status: Patient is alert oriented not any acute distress. Head exam was generally normal. There was no scleral icterus or corneal arcus. Mucous membranes were moist.  Lungs: Emphysematous chest occasional rhonchi.  Significant improvement in wheezing.Cardiac exam revealed the PMI to be normally situated and sized. The rhythm was regular and no extrasystoles were noted during several minutes of auscultation. The first and second heart sounds were normal and physiologic splitting of the second heart sound was noted. There were no murmurs, rubs, clicks, or gallops.Abdominal exam revealed normal bowel sounds. The abdomen was soft, non-tender, and without masses, organomegaly, or appreciable enlargement of the abdominal aorta.Examination of the skin revealed no evidence of significant rashes, suspicious appearing nevi or other concerning lesions..  Neurologically, the patient was awake, alert, and oriented to person, place and time. There were no obvious focal neurologic abnormalities.  Lower extremity no edema. As per HPI. Otherwise, a complete review of systems is negatve.  PAST MEDICAL HISTORY: Past Medical History  Diagnosis Date  . Coronary artery disease   . Pneumonia 2000  . Arthritis   . 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 Bruises easily   . GERD (gastroesophageal reflux disease)     takes Omeprazole daily as needed for stomach pain  . Blood transfusion 2001  . Diabetes mellitus     takes  Metformin daily;  . Impaired hearing     bil hearing aide  . Macular degeneration     being watched for this but hasn't been "completely" diagnosed  . Myocardial infarction 2001    PAST SURGICAL HISTORY: Past Surgical History  Procedure Laterality Date  . 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 Cardiac catheterization  2001  . Tonsillectomy      as a child  . Colonoscopy    . Cataract extraction      bilateral  .  Lumbar laminectomy/decompression microdiscectomy  12/19/2010    Procedure: LUMBAR LAMINECTOMY/DECOMPRESSION MICRODISCECTOMY;  Surgeon: Elaina Hoops;  Location: [facility];  Service: Neurosurgery;  Laterality: N/A;  Lumbar three-four, four-five decompressive lumbar laminectomy  . Joint replacement  right tkr  . Eye surgery      cataracts  . Back surgery      l4 5  . Portacath placement N/A 05/20/2014    Procedure: INSERTION PORT-A-CATH;  Surgeon: Nestor Lewandowsky, MD;  Location: [facility];  Service: General;  Laterality: N/A;    FAMILY HISTORY Family History  Problem Relation Age of Onset  . Anesthesia problems Neg Hx   . Hypotension Neg Hx   . Malignant hyperthermia Neg Hx   . Pseudochol deficiency Neg Hx         ADVANCED DIRECTIVES: Patient does have advanced health care directive   HEALTH MAINTENANCE: History  Substance Use Topics  . Smoking status: Former Smoker -- 40 years    Types: Cigarettes    Quit date: 05/19/1999  . Smokeless tobacco: Never Used     Comment: 2001  . Alcohol Use: No     No Known Allergies  Current Outpatient Prescriptions  Medication Sig Dispense Refill  . albuterol (PROVENTIL HFA;VENTOLIN HFA) 108 (90 BASE) MCG/ACT inhaler Inhale 2 puffs into the lungs as needed for wheezing or shortness of breath.     . Alcohol Swabs (B-D SINGLE USE SWABS REGULAR) PADS     . benzonatate (TESSALON) 100 MG capsule Take 1 capsule (100 mg total) by mouth 3 (three) times daily as needed for cough. 60 capsule 0  . Blood Glucose Calibration (TRUETEST CONTROL LEVEL 1) LIQD     . CHERATUSSIN AC 100-10 MG/5ML syrup     . glimepiride (AMARYL) 1 MG tablet     . glucose blood test strip     . HYDROcodone-acetaminophen (NORCO/VICODIN) 5-325 MG per tablet Take by mouth.    Elmore Guise Devices (TRUEDRAW LANCING DEVICE) MISC     . lidocaine-prilocaine (EMLA) cream Apply to affected area once 30 g 3  . magnesium oxide (MAG-OX) 400 MG tablet Take 400 mg by mouth daily.      .  metFORMIN (GLUCOPHAGE) 500 MG tablet Take 500 mg by mouth daily. Take '1000mg'$  po in morning, take '500mg'$  po at bedtime    . metoprolol (LOPRESSOR) 50 MG tablet 50 mg 2 (two) times daily.    Marland Kitchen omeprazole (PRILOSEC) 40 MG capsule Take 40 mg by mouth daily as needed. For acid reflux      . simvastatin (ZOCOR) 40 MG tablet Take 40 mg by mouth at bedtime.      . SYMBICORT 160-4.5 MCG/ACT inhaler   12  . traMADol (ULTRAM) 50 MG tablet Take 50 mg by mouth every 6 (six) hours as needed. Maximum dose= 8 tablets per day for pain     . TRUEPLUS LANCETS 28G MISC     . ANORO ELLIPTA  62.5-25 MCG/INH AEPB   11  . aspirin 81 MG tablet     . beta carotene w/minerals (OCUVITE) tablet Take 1 tablet by mouth 2 (two) times daily.    Marland Kitchen dexamethasone (DECADRON) 4 MG tablet Take 2 tablets (8 mg total) by mouth 2 (two) times daily with a meal. Start the day after chemotherapy for 3 days. (Patient not taking: Reported on 06/03/2014) 30 tablet 1  . Melatonin 5 MG CAPS Take 1 capsule (5 mg total) by mouth at bedtime as needed.  0  . naproxen (NAPROSYN) 500 MG tablet     . ondansetron (ZOFRAN) 8 MG tablet Take 1 tablet (8 mg total) by mouth 2 (two) times daily. Start the day after chemo for 3 days. Then take as needed for nausea or vomiting. (Patient not taking: Reported on 06/03/2014) 30 tablet 1  . oxyCODONE-acetaminophen (PERCOCET) 5-325 MG per tablet Take 1 tablet by mouth every 6 (six) hours as needed. For pain     . predniSONE (DELTASONE) 10 MG tablet     . predniSONE (DELTASONE) 20 MG tablet Take 1 tablet (20 mg total) by mouth daily with breakfast. Take for 15 days. 15 tablet 0   No current facility-administered medications for this visit.   Facility-Administered Medications Ordered in Other Visits  Medication Dose Route Frequency Provider Last Rate Last Dose  . lidocaine-prilocaine (EMLA) cream   Topical Once Evlyn Kanner, NP      . sodium chloride 0.9 % injection 10 mL  10 mL Intracatheter PRN Forest Gleason, MD    10 mL at 06/03/14 1106    OBJECTIVE: Filed Vitals:   06/03/14 1232  Pulse: 79  Temp: 97.8 F (36.6 C)     Body mass index is 29.15 kg/(m^2).    ECOG FS:1 - Symptomatic but completely ambulatory  Physical Exam  Constitutional: No distress.  HENT:  Head: Normocephalic and atraumatic.  Right Ear: External ear normal.  Left Ear: External ear normal.  Nose: Nose normal.  Mouth/Throat: Oropharynx is clear and moist.  Eyes: Conjunctivae and EOM are normal. Pupils are equal, round, and reactive to light.  Neck: Normal range of motion. Neck supple. No JVD present. No tracheal deviation present. No thyromegaly present.  Cardiovascular: Normal rate, normal heart sounds and intact distal pulses.  Exam reveals no gallop and no friction rub.   No murmur heard. Pulmonary/Chest: No stridor. No respiratory distress. He has no wheezes. He has no rales. He exhibits no tenderness.  Abdominal: He exhibits no distension and no mass. There is no tenderness. There is no rebound and no guarding.  Musculoskeletal: He exhibits no edema or tenderness.  Neurological: He displays normal reflexes. No cranial nerve deficit. He exhibits normal muscle tone. Coordination normal.  Skin: No rash noted. He is not diaphoretic. No erythema.  Psychiatric: Memory, affect and judgment normal.  Nursing note and vitals reviewed.    LAB RESULTS:     Component Value Date/Time   NA 133* 06/03/2014 1102   NA 134* 04/30/2014 0938   K 4.6 06/03/2014 1102   K 5.0 04/30/2014 0938   CL 104 06/03/2014 1102   CL 102 04/30/2014 0938   CO2 22 06/03/2014 1102   CO2 25 04/30/2014 0938   GLUCOSE 215* 06/03/2014 1102   GLUCOSE 225* 04/30/2014 0938   BUN 35* 06/03/2014 1102   BUN 25* 04/30/2014 0938   CREATININE 1.32* 06/03/2014 1102   CREATININE 1.20 04/30/2014 0938   CALCIUM 8.9 06/03/2014 1102   CALCIUM  9.2 04/30/2014 0938   PROT 7.3 06/03/2014 1102   PROT 7.5 04/30/2014 0938   ALBUMIN 3.8 06/03/2014 1102   ALBUMIN 3.7  04/30/2014 0938   AST 76* 06/03/2014 1102   AST 46* 04/30/2014 0938   ALT 61 06/03/2014 1102   ALT 40 04/30/2014 0938   ALKPHOS 96 06/03/2014 1102   ALKPHOS 104 04/30/2014 0938   BILITOT 0.4 06/03/2014 1102   GFRNONAA 51* 06/03/2014 1102   GFRNONAA 59* 04/30/2014 0938   GFRAA 60* 06/03/2014 1102   GFRAA >60 04/30/2014 0938    No results found for: SPEP, UPEP  Lab Results  Component Value Date   WBC 7.9 06/03/2014   NEUTROABS 5.8 06/03/2014   HGB 11.7* 06/03/2014   HCT 36.3* 06/03/2014   MCV 86.5 06/03/2014   PLT 204 06/03/2014      Chemistry      Component Value Date/Time   NA 133* 06/03/2014 1102   NA 134* 04/30/2014 0938   K 4.6 06/03/2014 1102   K 5.0 04/30/2014 0938   CL 104 06/03/2014 1102   CL 102 04/30/2014 0938   CO2 22 06/03/2014 1102   CO2 25 04/30/2014 0938   BUN 35* 06/03/2014 1102   BUN 25* 04/30/2014 0938   CREATININE 1.32* 06/03/2014 1102   CREATININE 1.20 04/30/2014 0938      Component Value Date/Time   CALCIUM 8.9 06/03/2014 1102   CALCIUM 9.2 04/30/2014 0938   ALKPHOS 96 06/03/2014 1102   ALKPHOS 104 04/30/2014 0938   AST 76* 06/03/2014 1102   AST 46* 04/30/2014 0938   ALT 61 06/03/2014 1102   ALT 40 04/30/2014 0938   BILITOT 0.4 06/03/2014 1102       STUDIES: Dg Chest 1 View  05/20/2014   CLINICAL DATA:  73 year old male right porta cath placement. Lung cancer. Initial encounter.  EXAM: CHEST  1 VIEW  COMPARISON:  PET-CT 04/2014 and earlier.  FINDINGS: Portable AP upright view at 1504 hrs. Right chest IJ approach porta cath in place. Catheter tip at the level of the cavoatrial junction. No pneumothorax.  Stable cardiac size and mediastinal contours. Sequelae of CABG. Confluent opacity at the left lung base appears increased from prior studies although confluent lingula atelectasis was present in April. This may in part reflect a pleural effusion. No pulmonary edema. The right lung remains clear.  IMPRESSION: 1. Right IJ porta cath placed  with no adverse features. 2. Worsening ventilation at the left lung base may reflect post obstructive changes and/or new pleural effusion in this setting.   Electronically Signed   By: Genevie Ann M.D.   On: 05/20/2014 15:20   Dg C-arm 1-60 Min-no Report  05/20/2014   CLINICAL DATA: port-a-cath insertion   C-ARM 1-60 MINUTES  Fluoroscopy was utilized by the requesting physician.  No radiographic  interpretation.     ASSESSMENT: \ 1.  Carcinoma of lung left upper lobe.  Stage IIIa squamous cell carcinoma.  Initiate treatment.  All lab data has been reviewed. Continue radiation and chemotherapy.   Dry hacking cough  Had small dose of prednisone to control cough due to radiation chemotherapy has effecting dissolving symptom t  2. Patient expressed understanding and was in agreement with this plan. He also understands that He can call clinic at any time with any questions, concerns, or complaints.    Cancer of lung   Staging form: Lung, AJCC 7th Edition     Clinical: T2, N2, M0 - Unsigned   Forest Gleason, MD  06/08/2014 12:37 PM

## 2014-06-09 ENCOUNTER — Ambulatory Visit
Admission: RE | Admit: 2014-06-09 | Discharge: 2014-06-09 | Disposition: A | Discharge status: Home / Self Care | Payer: Medicare HMO | Source: Ambulatory Visit | Attending: Radiation Oncology | Admitting: Radiation Oncology

## 2014-06-09 DIAGNOSIS — C3412 Malignant neoplasm of upper lobe, left bronchus or lung: Secondary | ICD-10-CM | POA: Diagnosis not present

## 2014-06-10 ENCOUNTER — Ambulatory Visit
Admission: RE | Admit: 2014-06-10 | Discharge: 2014-06-10 | Disposition: A | Discharge status: Home / Self Care | Payer: Medicare HMO | Source: Ambulatory Visit | Attending: Radiation Oncology | Admitting: Radiation Oncology

## 2014-06-10 ENCOUNTER — Inpatient Hospital Stay (HOSPITAL_BASED_OUTPATIENT_CLINIC_OR_DEPARTMENT_OTHER): Payer: Medicare HMO | Admitting: Oncology

## 2014-06-10 ENCOUNTER — Inpatient Hospital Stay: Payer: Medicare HMO | Attending: Oncology

## 2014-06-10 ENCOUNTER — Inpatient Hospital Stay: Payer: Medicare HMO

## 2014-06-10 VITALS — BP 116/63 | HR 80 | Temp 96.0°F | Wt 222.4 lb

## 2014-06-10 DIAGNOSIS — R05 Cough: Secondary | ICD-10-CM | POA: Insufficient documentation

## 2014-06-10 DIAGNOSIS — Z923 Personal history of irradiation: Secondary | ICD-10-CM | POA: Diagnosis not present

## 2014-06-10 DIAGNOSIS — Z7982 Long term (current) use of aspirin: Secondary | ICD-10-CM

## 2014-06-10 DIAGNOSIS — E119 Type 2 diabetes mellitus without complications: Secondary | ICD-10-CM | POA: Diagnosis not present

## 2014-06-10 DIAGNOSIS — Z951 Presence of aortocoronary bypass graft: Secondary | ICD-10-CM | POA: Insufficient documentation

## 2014-06-10 DIAGNOSIS — Z79899 Other long term (current) drug therapy: Secondary | ICD-10-CM

## 2014-06-10 DIAGNOSIS — C3402 Malignant neoplasm of left main bronchus: Secondary | ICD-10-CM

## 2014-06-10 DIAGNOSIS — M1990 Unspecified osteoarthritis, unspecified site: Secondary | ICD-10-CM | POA: Diagnosis not present

## 2014-06-10 DIAGNOSIS — Z87891 Personal history of nicotine dependence: Secondary | ICD-10-CM | POA: Diagnosis not present

## 2014-06-10 DIAGNOSIS — M549 Dorsalgia, unspecified: Secondary | ICD-10-CM

## 2014-06-10 DIAGNOSIS — C3412 Malignant neoplasm of upper lobe, left bronchus or lung: Secondary | ICD-10-CM | POA: Insufficient documentation

## 2014-06-10 DIAGNOSIS — K219 Gastro-esophageal reflux disease without esophagitis: Secondary | ICD-10-CM

## 2014-06-10 DIAGNOSIS — G8929 Other chronic pain: Secondary | ICD-10-CM | POA: Insufficient documentation

## 2014-06-10 DIAGNOSIS — I2510 Atherosclerotic heart disease of native coronary artery without angina pectoris: Secondary | ICD-10-CM | POA: Insufficient documentation

## 2014-06-10 DIAGNOSIS — C3490 Malignant neoplasm of unspecified part of unspecified bronchus or lung: Secondary | ICD-10-CM

## 2014-06-10 DIAGNOSIS — Z5111 Encounter for antineoplastic chemotherapy: Secondary | ICD-10-CM | POA: Insufficient documentation

## 2014-06-10 LAB — CBC WITH DIFFERENTIAL/PLATELET
Basophils Absolute: 0 10*3/uL (ref 0–0.1)
Basophils Relative: 0 %
Eosinophils Absolute: 0.1 10*3/uL (ref 0–0.7)
Eosinophils Relative: 2 %
HCT: 35.8 % — ABNORMAL LOW (ref 40.0–52.0)
Hemoglobin: 11.4 g/dL — ABNORMAL LOW (ref 13.0–18.0)
LYMPHS ABS: 0.8 10*3/uL — AB (ref 1.0–3.6)
Lymphocytes Relative: 13 %
MCH: 27.8 pg (ref 26.0–34.0)
MCHC: 32 g/dL (ref 32.0–36.0)
MCV: 87 fL (ref 80.0–100.0)
MONOS PCT: 7 %
Monocytes Absolute: 0.4 10*3/uL (ref 0.2–1.0)
Neutro Abs: 4.9 10*3/uL (ref 1.4–6.5)
Neutrophils Relative %: 78 %
PLATELETS: 172 10*3/uL (ref 150–440)
RBC: 4.11 MIL/uL — AB (ref 4.40–5.90)
RDW: 16.1 % — ABNORMAL HIGH (ref 11.5–14.5)
WBC: 6.3 10*3/uL (ref 3.8–10.6)

## 2014-06-10 LAB — COMPREHENSIVE METABOLIC PANEL
ALBUMIN: 3.7 g/dL (ref 3.5–5.0)
ALT: 53 U/L (ref 17–63)
AST: 54 U/L — AB (ref 15–41)
Alkaline Phosphatase: 85 U/L (ref 38–126)
Anion gap: 7 (ref 5–15)
BILIRUBIN TOTAL: 0.5 mg/dL (ref 0.3–1.2)
BUN: 28 mg/dL — ABNORMAL HIGH (ref 6–20)
CALCIUM: 8.7 mg/dL — AB (ref 8.9–10.3)
CHLORIDE: 104 mmol/L (ref 101–111)
CO2: 22 mmol/L (ref 22–32)
Creatinine, Ser: 1.15 mg/dL (ref 0.61–1.24)
GFR calc Af Amer: >60 mL/min (ref >60)
GFR calc non Af Amer: >60 mL/min (ref >60)
GLUCOSE: 230 mg/dL — AB (ref 65–99)
POTASSIUM: 4.3 mmol/L (ref 3.5–5.1)
Sodium: 133 mmol/L — ABNORMAL LOW (ref 135–145)
TOTAL PROTEIN: 6.7 g/dL (ref 6.5–8.1)

## 2014-06-10 MED ORDER — HEPARIN SOD (PORK) LOCK FLUSH 100 UNIT/ML IV SOLN
500.0000 [IU] | Freq: Once | INTRAVENOUS | Status: AC | PRN
  Administered 2014-06-10: 500 [IU]
  Filled 2014-06-10: qty 5

## 2014-06-10 MED ORDER — PALONOSETRON HCL INJECTION 0.25 MG/5ML
0.2500 mg | Freq: Once | INTRAVENOUS | Status: AC
  Administered 2014-06-10: 0.25 mg via INTRAVENOUS
  Filled 2014-06-10: qty 5

## 2014-06-10 MED ORDER — DEXTROSE 5 % IV SOLN
45.0000 mg/m2 | Freq: Once | INTRAVENOUS | Status: AC
  Administered 2014-06-10: 102 mg via INTRAVENOUS
  Filled 2014-06-10: qty 17

## 2014-06-10 MED ORDER — DIPHENHYDRAMINE HCL 50 MG/ML IJ SOLN
50.0000 mg | Freq: Once | INTRAMUSCULAR | Status: AC
  Administered 2014-06-10: 50 mg via INTRAVENOUS

## 2014-06-10 MED ORDER — DIPHENHYDRAMINE HCL 50 MG/ML IJ SOLN
INTRAMUSCULAR | Status: AC
  Filled 2014-06-10: qty 1

## 2014-06-10 MED ORDER — SODIUM CHLORIDE 0.9 % IV SOLN: Freq: Once | INTRAVENOUS | Status: DC

## 2014-06-10 MED ORDER — SODIUM CHLORIDE 0.9 % IV SOLN
Freq: Once | INTRAVENOUS | Status: AC
  Administered 2014-06-10: 13:00:00 via INTRAVENOUS
  Filled 2014-06-10: qty 5

## 2014-06-10 MED ORDER — SODIUM CHLORIDE 0.9 % IV SOLN
Freq: Once | INTRAVENOUS | Status: AC
  Administered 2014-06-10: 13:00:00 via INTRAVENOUS
  Filled 2014-06-10: qty 1000

## 2014-06-10 MED ORDER — FAMOTIDINE IN NACL 20-0.9 MG/50ML-% IV SOLN
20.0000 mg | Freq: Once | INTRAVENOUS | Status: AC
  Administered 2014-06-10: 20 mg via INTRAVENOUS
  Filled 2014-06-10: qty 50

## 2014-06-10 MED ORDER — CARBOPLATIN CHEMO INJECTION 450 MG/45ML
211.4000 mg | Freq: Once | INTRAVENOUS | Status: AC
  Administered 2014-06-10: 210 mg via INTRAVENOUS
  Filled 2014-06-10: qty 21

## 2014-06-10 NOTE — Progress Notes (Signed)
Patient former smoker.  Quit 2001.  Does have living will.

## 2014-06-11 ENCOUNTER — Ambulatory Visit
Admission: RE | Admit: 2014-06-11 | Discharge: 2014-06-11 | Disposition: A | Discharge status: Home / Self Care | Payer: Medicare HMO | Source: Ambulatory Visit | Attending: Radiation Oncology | Admitting: Radiation Oncology

## 2014-06-11 DIAGNOSIS — C3412 Malignant neoplasm of upper lobe, left bronchus or lung: Secondary | ICD-10-CM | POA: Diagnosis not present

## 2014-06-11 NOTE — Progress Notes (Unsigned)
PSN spoke with patient's wife via phone.  Patient will receive co-pay assistance for certain medications from the [facility].  Patient will also be given a gas voucher due to his 40 mile round trip commute to his treatment at the cancer center.

## 2014-06-12 ENCOUNTER — Encounter: Payer: Self-pay | Admitting: Oncology

## 2014-06-12 ENCOUNTER — Ambulatory Visit
Admission: RE | Admit: 2014-06-12 | Discharge: 2014-06-12 | Disposition: A | Discharge status: Home / Self Care | Payer: Medicare HMO | Source: Ambulatory Visit | Attending: Radiation Oncology | Admitting: Radiation Oncology

## 2014-06-12 DIAGNOSIS — C3412 Malignant neoplasm of upper lobe, left bronchus or lung: Secondary | ICD-10-CM | POA: Diagnosis not present

## 2014-06-12 NOTE — Progress Notes (Signed)
[place] @ [facility] [phone]  Fax:([phone]) [place]: 21-Oct-1940  MR#: [id number]  HEK#:[id number]  Patient Care Team: Albina Billet, MD as PCP - General (Internal Medicine)  CHIEF COMPLAINT:  Chief Complaint  Patient presents with  . Follow-up    Oncology History   74 year old gentleman with left hilar mass and mediastinal adenopathy Chronic smoker quit smoking in 2000 Non-insulin-dependent diabetes History of coronary artery disease with status post bypass surgery done in year 2000 1.  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  Patient is starting carboplatinum and Taxol with AUC of 2 and 45 mg/m respectively concurrent with radiation therapy (May 27, 2014)     Cancer of lung    Cancer Diagnosis     Oncology Flowsheet 12/19/2010 05/20/2014 05/27/2014 06/03/2014 06/10/2014  Day, Cycle - - Day 1, Cycle 1 Day 1, Cycle 2 Day 1, Cycle 3  CARBOplatin (PARAPLATIN) IV - - 230 mg 190 mg 210 mg  dexamethasone (DECADRON) IV - - [ 12 mg ] [ 12 mg ] [ 12 mg ]  fosaprepitant (EMEND) IV - - [ 150 mg ] [ 150 mg ] [ 150 mg ]  ondansetron (ZOFRAN) IJ - - - - -  ondansetron (ZOFRAN) IV - - - - -  PACLitaxel (TAXOL) IV - - 45 mg/m2 45 mg/m2 45 mg/m2  palonosetron (ALOXI) IV - - 0.25 mg 0.25 mg 0.25 mg    INTERVAL HISTORY: Patient underwent EBUS and biopsy was positive for squamous cell carcinoma of lung.  Was stages IIIa disease.  Here for further follow-up and treatment consideration.  Had one episode of hemoptysis after bronchoscopy .  Patient was advised to stop taking aspirin.  Hemoptysis has resolved. Dry hacking cough continues . Jun 03, 2014 patient is here for ongoing evaluation and continuation of chemotherapy.  Also started radiation therapy.  Patient's cough is improved however when prednisone tapered off started having some increasing cough suppression will be started  on prednisone small dose to radiation chemotherapy has some effect in improving symptom. June 10, 2014 Patient has started radiation therapy.  Tolerating chemotherapy and radiation therapy very well.  Prednisone is helping with off.  No tingling.  No numbness.  No soreness in the mouth  REVIEW OF SYSTEMS:   ROS Gen. status: Patient is alert oriented not any acute distress. Head exam was generally normal. There was no scleral icterus or corneal arcus. Mucous membranes were moist.  Lungs: Emphysematous chest occasional rhonchi.  Significant improvement in wheezing.Cardiac exam revealed the PMI to be normally situated and sized. The rhythm was regular and no extrasystoles were noted during several minutes of auscultation. The first and second heart sounds were normal and physiologic splitting of the second heart sound was noted. There were no murmurs, rubs, clicks, or gallops.Abdominal exam revealed normal bowel sounds. The abdomen was soft, non-tender, and without masses, organomegaly, or appreciable enlargement of the abdominal aorta.Examination of the skin revealed no evidence of significant rashes, suspicious appearing nevi or other concerning lesions..  Neurologically, the patient was awake, alert, and oriented to person, place and time. There were no obvious focal neurologic abnormalities.  Lower extremity no edema. As per HPI. Otherwise, a complete review of systems is negatve.  PAST MEDICAL HISTORY: Past Medical History  Diagnosis Date  . Coronary artery disease   . Pneumonia  2000  . Arthritis   . 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 Bruises easily   . GERD (gastroesophageal reflux disease)     takes Omeprazole daily as needed for stomach pain  . Blood transfusion 2001  . Diabetes mellitus     takes Metformin daily;  . Impaired hearing     bil hearing aide  . Macular degeneration     being watched for this but hasn't been "completely" diagnosed  . Myocardial infarction 2001     PAST SURGICAL HISTORY: Past Surgical History  Procedure Laterality Date  . 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 Cardiac catheterization  2001  . Tonsillectomy      as a child  . Colonoscopy    . Cataract extraction      bilateral  . Lumbar laminectomy/decompression microdiscectomy  12/19/2010    Procedure: LUMBAR LAMINECTOMY/DECOMPRESSION MICRODISCECTOMY;  Surgeon: Elaina Hoops;  Location: [place] [facility];  Service: Neurosurgery;  Laterality: N/A;  Lumbar three-four, four-five decompressive lumbar laminectomy  . Joint replacement  right tkr  . Eye surgery      cataracts  . Back surgery      l4 5  . Portacath placement N/A 05/20/2014    Procedure: INSERTION PORT-A-CATH;  Surgeon: Nestor Lewandowsky, MD;  Location: [facility];  Service: General;  Laterality: N/A;    FAMILY HISTORY Family History  Problem Relation Age of Onset  . Anesthesia problems Neg Hx   . Hypotension Neg Hx   . Malignant hyperthermia Neg Hx   . Pseudochol deficiency Neg Hx         ADVANCED DIRECTIVES: Patient does have advanced health care directive   HEALTH MAINTENANCE: History  Substance Use Topics  . Smoking status: Former Smoker -- 40 years    Types: Cigarettes    Quit date: 05/19/1999  . Smokeless tobacco: Never Used     Comment: 2001  . Alcohol Use: No     No Known Allergies  Current Outpatient Prescriptions  Medication Sig Dispense Refill  . albuterol (PROVENTIL HFA;VENTOLIN HFA) 108 (90 BASE) MCG/ACT inhaler Inhale 2 puffs into the lungs as needed for wheezing or shortness of breath.     . Alcohol Swabs (B-D SINGLE USE SWABS REGULAR) PADS     . ANORO ELLIPTA 62.5-25 MCG/INH AEPB   11  . aspirin 81 MG tablet     . benzonatate (TESSALON) 100 MG capsule TAKE 1 CAPSULE (100 MG TOTAL) BY MOUTH 3 (THREE) TIMES DAILY AS NEEDED FOR COUGH. 60 capsule 0  . beta carotene w/minerals (OCUVITE) tablet Take 1 tablet by mouth 2 (two) times daily.    . Blood Glucose Calibration (TRUETEST  CONTROL LEVEL 1) LIQD     . CHERATUSSIN AC 100-10 MG/5ML syrup     . dexamethasone (DECADRON) 4 MG tablet Take 2 tablets (8 mg total) by mouth 2 (two) times daily with a meal. Start the day after chemotherapy for 3 days. 30 tablet 1  . glimepiride (AMARYL) 1 MG tablet     . glucose blood test strip     . HYDROcodone-acetaminophen (NORCO/VICODIN) 5-325 MG per tablet Take by mouth.    Elmore Guise Devices (TRUEDRAW LANCING DEVICE) MISC     . lidocaine-prilocaine (EMLA) cream Apply to affected area once 30 g 3  . magnesium oxide (MAG-OX) 400 MG tablet Take 400 mg by mouth daily.      . Melatonin 5 MG CAPS  Take 1 capsule (5 mg total) by mouth at bedtime as needed.  0  . metFORMIN (GLUCOPHAGE) 500 MG tablet Take 500 mg by mouth daily. Take '1000mg'$  po in morning, take '500mg'$  po at bedtime    . metoprolol (LOPRESSOR) 50 MG tablet 50 mg 2 (two) times daily.    . naproxen (NAPROSYN) 500 MG tablet     . omeprazole (PRILOSEC) 40 MG capsule Take 40 mg by mouth daily as needed. For acid reflux      . ondansetron (ZOFRAN) 8 MG tablet Take 1 tablet (8 mg total) by mouth 2 (two) times daily. Start the day after chemo for 3 days. Then take as needed for nausea or vomiting. 30 tablet 1  . oxyCODONE-acetaminophen (PERCOCET) 5-325 MG per tablet Take 1 tablet by mouth every 6 (six) hours as needed. For pain     . predniSONE (DELTASONE) 20 MG tablet Take 1 tablet (20 mg total) by mouth daily with breakfast. Take for 15 days. 15 tablet 0  . simvastatin (ZOCOR) 40 MG tablet Take 40 mg by mouth at bedtime.      . SYMBICORT 160-4.5 MCG/ACT inhaler   12  . traMADol (ULTRAM) 50 MG tablet Take 50 mg by mouth every 6 (six) hours as needed. Maximum dose= 8 tablets per day for pain     . TRUEPLUS LANCETS 28G MISC     . predniSONE (DELTASONE) 10 MG tablet      No current facility-administered medications for this visit.   Facility-Administered Medications Ordered in Other Visits  Medication Dose Route Frequency Provider Last  Rate Last Dose  . lidocaine-prilocaine (EMLA) cream   Topical Once Evlyn Kanner, NP      . sodium chloride 0.9 % injection 10 mL  10 mL Intracatheter PRN Forest Gleason, MD   10 mL at 06/03/14 1106    OBJECTIVE: Filed Vitals:   06/10/14 1141  BP: 116/63  Pulse: 80  Temp: 96 F (35.6 C)     Body mass index is 29.35 kg/(m^2).    ECOG FS:1 - Symptomatic but completely ambulatory  Physical Exam  Constitutional: No distress.  HENT:  Head: Normocephalic and atraumatic.  Right Ear: External ear normal.  Left Ear: External ear normal.  Nose: Nose normal.  Mouth/Throat: Oropharynx is clear and moist.  Eyes: Conjunctivae and EOM are normal. Pupils are equal, round, and reactive to light.  Neck: Normal range of motion. Neck supple. No JVD present. No tracheal deviation present. No thyromegaly present.  Cardiovascular: Normal rate, normal heart sounds and intact distal pulses.  Exam reveals no gallop and no friction rub.   No murmur heard. Pulmonary/Chest: No stridor. No respiratory distress. He has no wheezes. He has no rales. He exhibits no tenderness.  Abdominal: He exhibits no distension and no mass. There is no tenderness. There is no rebound and no guarding.  Musculoskeletal: He exhibits no edema or tenderness.  Neurological: He displays normal reflexes. No cranial nerve deficit. He exhibits normal muscle tone. Coordination normal.  Skin: No rash noted. He is not diaphoretic. No erythema.  Psychiatric: Memory, affect and judgment normal.  Nursing note and vitals reviewed.    LAB RESULTS:     Component Value Date/Time   NA 133* 06/10/2014 1034   NA 134* 04/30/2014 0938   K 4.3 06/10/2014 1034   K 5.0 04/30/2014 0938   CL 104 06/10/2014 1034   CL 102 04/30/2014 0938   CO2 22 06/10/2014 1034   CO2 25 04/30/2014  4742   GLUCOSE 230* 06/10/2014 1034   GLUCOSE 225* 04/30/2014 0938   BUN 28* 06/10/2014 1034   BUN 25* 04/30/2014 0938   CREATININE 1.15 06/10/2014 1034    CREATININE 1.20 04/30/2014 0938   CALCIUM 8.7* 06/10/2014 1034   CALCIUM 9.2 04/30/2014 0938   PROT 6.7 06/10/2014 1034   PROT 7.5 04/30/2014 0938   ALBUMIN 3.7 06/10/2014 1034   ALBUMIN 3.7 04/30/2014 0938   AST 54* 06/10/2014 1034   AST 46* 04/30/2014 0938   ALT 53 06/10/2014 1034   ALT 40 04/30/2014 0938   ALKPHOS 85 06/10/2014 1034   ALKPHOS 104 04/30/2014 0938   BILITOT 0.5 06/10/2014 1034   GFRNONAA >60 06/10/2014 1034   GFRNONAA 59* 04/30/2014 0938   GFRAA >60 06/10/2014 1034   GFRAA >60 04/30/2014 0938    No results found for: SPEP, UPEP  Lab Results  Component Value Date   WBC 6.3 06/10/2014   NEUTROABS 4.9 06/10/2014   HGB 11.4* 06/10/2014   HCT 35.8* 06/10/2014   MCV 87.0 06/10/2014   PLT 172 06/10/2014      Chemistry      Component Value Date/Time   NA 133* 06/10/2014 1034   NA 134* 04/30/2014 0938   K 4.3 06/10/2014 1034   K 5.0 04/30/2014 0938   CL 104 06/10/2014 1034   CL 102 04/30/2014 0938   CO2 22 06/10/2014 1034   CO2 25 04/30/2014 0938   BUN 28* 06/10/2014 1034   BUN 25* 04/30/2014 0938   CREATININE 1.15 06/10/2014 1034   CREATININE 1.20 04/30/2014 0938      Component Value Date/Time   CALCIUM 8.7* 06/10/2014 1034   CALCIUM 9.2 04/30/2014 0938   ALKPHOS 85 06/10/2014 1034   ALKPHOS 104 04/30/2014 0938   AST 54* 06/10/2014 1034   AST 46* 04/30/2014 0938   ALT 53 06/10/2014 1034   ALT 40 04/30/2014 0938   BILITOT 0.5 06/10/2014 1034       STUDIES: Dg Chest 1 View  05/20/2014   CLINICAL DATA:  74 year old male right porta cath placement. Lung cancer. Initial encounter.  EXAM: CHEST  1 VIEW  COMPARISON:  PET-CT 04/2014 and earlier.  FINDINGS: Portable AP upright view at 1504 hrs. Right chest IJ approach porta cath in place. Catheter tip at the level of the cavoatrial junction. No pneumothorax.  Stable cardiac size and mediastinal contours. Sequelae of CABG. Confluent opacity at the left lung base appears increased from prior studies  although confluent lingula atelectasis was present in April. This may in part reflect a pleural effusion. No pulmonary edema. The right lung remains clear.  IMPRESSION: 1. Right IJ porta cath placed with no adverse features. 2. Worsening ventilation at the left lung base may reflect post obstructive changes and/or new pleural effusion in this setting.   Electronically Signed   By: Genevie Ann M.D.   On: 05/20/2014 15:20   Dg C-arm 1-60 Min-no Report  05/20/2014   CLINICAL DATA: port-a-cath insertion   C-ARM 1-60 MINUTES  Fluoroscopy was utilized by the requesting physician.  No radiographic  interpretation.     ASSESSMENT: \ 1.  Carcinoma of lung left upper lobe.  Stage IIIa squamous cell carcinoma.  Initiate treatment.  All lab data has been reviewed. Continue radiation and chemotherapy.   Dry hacking cough  Had small dose of prednisone to control cough due to radiation chemotherapy has effecting dissolving symptom t  2.continue weekly carboplatinum and Taxol 3 concurrent with radiation therapy No  significant side effect Patient expressed understanding and was in agreement with this plan. He also understands that He can call clinic at any time with any questions, concerns, or complaints.    Cancer of lung   Staging form: Lung, AJCC 7th Edition     Clinical: T2, N2, M0 - Unsigned   Forest Gleason, MD   06/12/2014 9:46 AM

## 2014-06-15 ENCOUNTER — Ambulatory Visit
Admission: RE | Admit: 2014-06-15 | Discharge: 2014-06-15 | Disposition: A | Discharge status: Home / Self Care | Payer: Medicare HMO | Source: Ambulatory Visit | Attending: Radiation Oncology | Admitting: Radiation Oncology

## 2014-06-15 DIAGNOSIS — C3412 Malignant neoplasm of upper lobe, left bronchus or lung: Secondary | ICD-10-CM | POA: Diagnosis not present

## 2014-06-16 ENCOUNTER — Ambulatory Visit
Admission: RE | Admit: 2014-06-16 | Discharge: 2014-06-16 | Disposition: A | Discharge status: Home / Self Care | Payer: Medicare HMO | Source: Ambulatory Visit | Attending: Radiation Oncology | Admitting: Radiation Oncology

## 2014-06-16 ENCOUNTER — Telehealth: Payer: Self-pay | Admitting: *Deleted

## 2014-06-16 DIAGNOSIS — C3412 Malignant neoplasm of upper lobe, left bronchus or lung: Secondary | ICD-10-CM | POA: Diagnosis not present

## 2014-06-16 DIAGNOSIS — C3402 Malignant neoplasm of left main bronchus: Secondary | ICD-10-CM

## 2014-06-16 MED ORDER — BENZONATATE 100 MG PO CAPS: 100.0000 mg | ORAL_CAPSULE | Freq: Three times a day (TID) | ORAL | Status: DC | PRN

## 2014-06-16 MED ORDER — PREDNISONE 20 MG PO TABS: 20.0000 mg | ORAL_TABLET | Freq: Every day | ORAL | Status: DC

## 2014-06-16 MED ORDER — DEXTROMETHORPHAN-GUAIFENESIN 10-100 MG/5ML PO SYRP: 5.0000 mL | ORAL_SOLUTION | Freq: Two times a day (BID) | ORAL | Status: DC

## 2014-06-16 NOTE — Telephone Encounter (Signed)
Refill e scribed per VO Dr Oliva Bustard and RobitussinDM I called and spoke with pt that 3 rx was at pharmacy. He repeated this to me. And said they would go pick them up

## 2014-06-17 ENCOUNTER — Inpatient Hospital Stay: Payer: Medicare HMO

## 2014-06-17 ENCOUNTER — Ambulatory Visit
Admission: RE | Admit: 2014-06-17 | Discharge: 2014-06-17 | Disposition: A | Discharge status: Home / Self Care | Payer: Medicare HMO | Source: Ambulatory Visit | Attending: Radiation Oncology | Admitting: Radiation Oncology

## 2014-06-17 DIAGNOSIS — Z5111 Encounter for antineoplastic chemotherapy: Secondary | ICD-10-CM | POA: Diagnosis not present

## 2014-06-17 DIAGNOSIS — C3412 Malignant neoplasm of upper lobe, left bronchus or lung: Secondary | ICD-10-CM | POA: Diagnosis not present

## 2014-06-17 DIAGNOSIS — C3402 Malignant neoplasm of left main bronchus: Secondary | ICD-10-CM

## 2014-06-17 LAB — COMPREHENSIVE METABOLIC PANEL
ALT: 52 U/L (ref 17–63)
AST: 46 U/L — AB (ref 15–41)
Albumin: 3.5 g/dL (ref 3.5–5.0)
Alkaline Phosphatase: 93 U/L (ref 38–126)
Anion gap: 9 (ref 5–15)
BUN: 25 mg/dL — AB (ref 6–20)
CALCIUM: 8.9 mg/dL (ref 8.9–10.3)
CHLORIDE: 104 mmol/L (ref 101–111)
CO2: 22 mmol/L (ref 22–32)
Creatinine, Ser: 1.25 mg/dL — ABNORMAL HIGH (ref 0.61–1.24)
GFR calc non Af Amer: 55 mL/min — ABNORMAL LOW (ref >60)
Glucose, Bld: 246 mg/dL — ABNORMAL HIGH (ref 65–99)
Potassium: 4.3 mmol/L (ref 3.5–5.1)
Sodium: 135 mmol/L (ref 135–145)
Total Bilirubin: 0.5 mg/dL (ref 0.3–1.2)
Total Protein: 6.5 g/dL (ref 6.5–8.1)

## 2014-06-17 LAB — CBC WITH DIFFERENTIAL/PLATELET
BASOS ABS: 0 10*3/uL (ref 0–0.1)
Basophils Relative: 0 %
EOS ABS: 0 10*3/uL (ref 0–0.7)
Eosinophils Relative: 1 %
HEMATOCRIT: 35.8 % — AB (ref 40.0–52.0)
HEMOGLOBIN: 11.4 g/dL — AB (ref 13.0–18.0)
Lymphocytes Relative: 9 %
Lymphs Abs: 0.6 10*3/uL — ABNORMAL LOW (ref 1.0–3.6)
MCH: 27.9 pg (ref 26.0–34.0)
MCHC: 31.8 g/dL — ABNORMAL LOW (ref 32.0–36.0)
MCV: 87.6 fL (ref 80.0–100.0)
MONOS PCT: 7 %
Monocytes Absolute: 0.5 10*3/uL (ref 0.2–1.0)
NEUTROS ABS: 5.3 10*3/uL (ref 1.4–6.5)
Neutrophils Relative %: 83 %
PLATELETS: 175 10*3/uL (ref 150–440)
RBC: 4.09 MIL/uL — ABNORMAL LOW (ref 4.40–5.90)
RDW: 16.8 % — AB (ref 11.5–14.5)
WBC: 6.4 10*3/uL (ref 3.8–10.6)

## 2014-06-17 MED ORDER — SODIUM CHLORIDE 0.9 % IV SOLN
Freq: Once | INTRAVENOUS | Status: AC
  Administered 2014-06-17: 12:00:00 via INTRAVENOUS
  Filled 2014-06-17: qty 1000

## 2014-06-17 MED ORDER — PALONOSETRON HCL INJECTION 0.25 MG/5ML
0.2500 mg | Freq: Once | INTRAVENOUS | Status: AC
  Administered 2014-06-17: 0.25 mg via INTRAVENOUS
  Filled 2014-06-17: qty 5

## 2014-06-17 MED ORDER — SODIUM CHLORIDE 0.9 % IV SOLN
210.8000 mg | Freq: Once | INTRAVENOUS | Status: AC
  Administered 2014-06-17: 210 mg via INTRAVENOUS
  Filled 2014-06-17: qty 21

## 2014-06-17 MED ORDER — PACLITAXEL CHEMO INJECTION 300 MG/50ML
45.0000 mg/m2 | Freq: Once | INTRAVENOUS | Status: AC
  Administered 2014-06-17: 102 mg via INTRAVENOUS
  Filled 2014-06-17: qty 17

## 2014-06-17 MED ORDER — DIPHENHYDRAMINE HCL 50 MG/ML IJ SOLN
50.0000 mg | Freq: Once | INTRAMUSCULAR | Status: AC
  Administered 2014-06-17: 50 mg via INTRAVENOUS
  Filled 2014-06-17: qty 1

## 2014-06-17 MED ORDER — FAMOTIDINE IN NACL 20-0.9 MG/50ML-% IV SOLN
20.0000 mg | Freq: Once | INTRAVENOUS | Status: AC
  Administered 2014-06-17: 20 mg via INTRAVENOUS
  Filled 2014-06-17: qty 50

## 2014-06-17 MED ORDER — HEPARIN SOD (PORK) LOCK FLUSH 100 UNIT/ML IV SOLN
500.0000 [IU] | Freq: Once | INTRAVENOUS | Status: AC | PRN
Start: 2014-06-17 — End: 2014-06-17
  Administered 2014-06-17: 500 [IU]

## 2014-06-17 MED ORDER — SODIUM CHLORIDE 0.9 % IJ SOLN
10.0000 mL | INTRAMUSCULAR | Status: DC | PRN
  Administered 2014-06-17: 10 mL
  Filled 2014-06-17: qty 10

## 2014-06-17 MED ORDER — SODIUM CHLORIDE 0.9 % IV SOLN
Freq: Once | INTRAVENOUS | Status: AC
  Administered 2014-06-17: 12:00:00 via INTRAVENOUS
  Filled 2014-06-17: qty 5

## 2014-06-17 MED ORDER — SODIUM CHLORIDE 0.9 % IV SOLN: Freq: Once | INTRAVENOUS | Status: DC

## 2014-06-18 ENCOUNTER — Ambulatory Visit
Admission: RE | Admit: 2014-06-18 | Discharge: 2014-06-18 | Disposition: A | Discharge status: Home / Self Care | Payer: Medicare HMO | Source: Ambulatory Visit | Attending: Radiation Oncology | Admitting: Radiation Oncology

## 2014-06-18 DIAGNOSIS — C3412 Malignant neoplasm of upper lobe, left bronchus or lung: Secondary | ICD-10-CM | POA: Diagnosis not present

## 2014-06-19 ENCOUNTER — Ambulatory Visit
Admission: RE | Admit: 2014-06-19 | Discharge: 2014-06-19 | Disposition: A | Discharge status: Home / Self Care | Payer: Medicare HMO | Source: Ambulatory Visit | Attending: Radiation Oncology | Admitting: Radiation Oncology

## 2014-06-19 DIAGNOSIS — C3412 Malignant neoplasm of upper lobe, left bronchus or lung: Secondary | ICD-10-CM | POA: Diagnosis not present

## 2014-06-22 ENCOUNTER — Ambulatory Visit
Admission: RE | Admit: 2014-06-22 | Discharge: 2014-06-22 | Disposition: A | Discharge status: Home / Self Care | Payer: Medicare HMO | Source: Ambulatory Visit | Attending: Radiation Oncology | Admitting: Radiation Oncology

## 2014-06-22 ENCOUNTER — Telehealth: Payer: Self-pay | Admitting: *Deleted

## 2014-06-22 DIAGNOSIS — C3412 Malignant neoplasm of upper lobe, left bronchus or lung: Secondary | ICD-10-CM | POA: Diagnosis not present

## 2014-06-22 DIAGNOSIS — C3490 Malignant neoplasm of unspecified part of unspecified bronchus or lung: Secondary | ICD-10-CM

## 2014-06-22 MED ORDER — ZOLPIDEM TARTRATE 10 MG PO TABS: 10.0000 mg | ORAL_TABLET | Freq: Every evening | ORAL | Status: DC | PRN

## 2014-06-22 NOTE — Telephone Encounter (Signed)
Rx for ambien '10mg'$  called into pharmacy.

## 2014-06-23 ENCOUNTER — Inpatient Hospital Stay
Admission: RE | Admit: 2014-06-23 | Discharge: 2014-06-23 | Disposition: A | Discharge status: Home / Self Care | Payer: Self-pay | Source: Ambulatory Visit | Attending: Radiation Oncology | Admitting: Radiation Oncology

## 2014-06-23 ENCOUNTER — Other Ambulatory Visit: Payer: Self-pay | Admitting: Oncology

## 2014-06-23 ENCOUNTER — Ambulatory Visit
Admission: RE | Admit: 2014-06-23 | Discharge: 2014-06-23 | Disposition: A | Discharge status: Home / Self Care | Payer: Medicare HMO | Source: Ambulatory Visit | Attending: Radiation Oncology | Admitting: Radiation Oncology

## 2014-06-23 DIAGNOSIS — C3412 Malignant neoplasm of upper lobe, left bronchus or lung: Secondary | ICD-10-CM | POA: Diagnosis not present

## 2014-06-24 ENCOUNTER — Inpatient Hospital Stay: Payer: Medicare HMO

## 2014-06-24 ENCOUNTER — Ambulatory Visit
Admission: RE | Admit: 2014-06-24 | Discharge: 2014-06-24 | Disposition: A | Discharge status: Home / Self Care | Payer: Medicare HMO | Source: Ambulatory Visit | Attending: Radiation Oncology | Admitting: Radiation Oncology

## 2014-06-24 VITALS — BP 113/67 | HR 83 | Temp 97.7°F | Resp 18

## 2014-06-24 DIAGNOSIS — C3402 Malignant neoplasm of left main bronchus: Secondary | ICD-10-CM

## 2014-06-24 DIAGNOSIS — C3412 Malignant neoplasm of upper lobe, left bronchus or lung: Secondary | ICD-10-CM | POA: Diagnosis not present

## 2014-06-24 DIAGNOSIS — Z5111 Encounter for antineoplastic chemotherapy: Secondary | ICD-10-CM | POA: Diagnosis not present

## 2014-06-24 LAB — COMPREHENSIVE METABOLIC PANEL
ALT: 32 U/L (ref 17–63)
AST: 30 U/L (ref 15–41)
Albumin: 3.4 g/dL — ABNORMAL LOW (ref 3.5–5.0)
Alkaline Phosphatase: 100 U/L (ref 38–126)
Anion gap: 8 (ref 5–15)
BUN: 18 mg/dL (ref 6–20)
CO2: 23 mmol/L (ref 22–32)
CREATININE: 1.15 mg/dL (ref 0.61–1.24)
Calcium: 8.2 mg/dL — ABNORMAL LOW (ref 8.9–10.3)
Chloride: 102 mmol/L (ref 101–111)
GFR calc non Af Amer: >60 mL/min (ref >60)
Glucose, Bld: 210 mg/dL — ABNORMAL HIGH (ref 65–99)
Potassium: 4.2 mmol/L (ref 3.5–5.1)
Sodium: 133 mmol/L — ABNORMAL LOW (ref 135–145)
Total Bilirubin: 0.5 mg/dL (ref 0.3–1.2)
Total Protein: 6.9 g/dL (ref 6.5–8.1)

## 2014-06-24 LAB — CBC WITH DIFFERENTIAL/PLATELET
BASOS ABS: 0 10*3/uL (ref 0–0.1)
BASOS PCT: 1 %
EOS ABS: 0 10*3/uL (ref 0–0.7)
EOS PCT: 1 %
HEMATOCRIT: 34.7 % — AB (ref 40.0–52.0)
Hemoglobin: 11.1 g/dL — ABNORMAL LOW (ref 13.0–18.0)
Lymphocytes Relative: 8 %
Lymphs Abs: 0.3 10*3/uL — ABNORMAL LOW (ref 1.0–3.6)
MCH: 28.1 pg (ref 26.0–34.0)
MCHC: 31.9 g/dL — ABNORMAL LOW (ref 32.0–36.0)
MCV: 88 fL (ref 80.0–100.0)
MONO ABS: 0.5 10*3/uL (ref 0.2–1.0)
Monocytes Relative: 11 %
Neutro Abs: 3.5 10*3/uL (ref 1.4–6.5)
Neutrophils Relative %: 79 %
Platelets: 116 10*3/uL — ABNORMAL LOW (ref 150–440)
RBC: 3.95 MIL/uL — ABNORMAL LOW (ref 4.40–5.90)
RDW: 17.7 % — AB (ref 11.5–14.5)
WBC: 4.3 10*3/uL (ref 3.8–10.6)

## 2014-06-24 MED ORDER — FAMOTIDINE IN NACL 20-0.9 MG/50ML-% IV SOLN
20.0000 mg | Freq: Once | INTRAVENOUS | Status: AC
  Administered 2014-06-24: 20 mg via INTRAVENOUS

## 2014-06-24 MED ORDER — SODIUM CHLORIDE 0.9 % IV SOLN
Freq: Once | INTRAVENOUS | Status: AC
  Administered 2014-06-24: 11:00:00 via INTRAVENOUS
  Filled 2014-06-24: qty 250

## 2014-06-24 MED ORDER — SODIUM CHLORIDE 0.9 % IV SOLN
Freq: Once | INTRAVENOUS | Status: AC
  Administered 2014-06-24: 12:00:00 via INTRAVENOUS
  Filled 2014-06-24: qty 8

## 2014-06-24 MED ORDER — PACLITAXEL CHEMO INJECTION 300 MG/50ML
45.0000 mg/m2 | Freq: Once | INTRAVENOUS | Status: AC
  Administered 2014-06-24: 102 mg via INTRAVENOUS
  Filled 2014-06-24: qty 17

## 2014-06-24 MED ORDER — SODIUM CHLORIDE 0.9 % IJ SOLN
10.0000 mL | INTRAMUSCULAR | Status: DC | PRN
  Administered 2014-06-24: 10 mL
  Filled 2014-06-24: qty 10

## 2014-06-24 MED ORDER — HEPARIN SOD (PORK) LOCK FLUSH 100 UNIT/ML IV SOLN
500.0000 [IU] | Freq: Once | INTRAVENOUS | Status: AC | PRN
  Administered 2014-06-24: 500 [IU]
  Filled 2014-06-24: qty 5

## 2014-06-24 MED ORDER — DIPHENHYDRAMINE HCL 50 MG/ML IJ SOLN
50.0000 mg | Freq: Once | INTRAMUSCULAR | Status: AC
  Administered 2014-06-24: 50 mg via INTRAVENOUS
  Filled 2014-06-24: qty 1

## 2014-06-24 MED ORDER — SODIUM CHLORIDE 0.9 % IV SOLN
210.8000 mg | Freq: Once | INTRAVENOUS | Status: AC
  Administered 2014-06-24: 210 mg via INTRAVENOUS
  Filled 2014-06-24: qty 21

## 2014-06-25 ENCOUNTER — Ambulatory Visit
Admission: RE | Admit: 2014-06-25 | Discharge: 2014-06-25 | Disposition: A | Discharge status: Home / Self Care | Payer: Medicare HMO | Source: Ambulatory Visit | Attending: Radiation Oncology | Admitting: Radiation Oncology

## 2014-06-25 DIAGNOSIS — C3412 Malignant neoplasm of upper lobe, left bronchus or lung: Secondary | ICD-10-CM | POA: Diagnosis not present

## 2014-06-26 ENCOUNTER — Ambulatory Visit
Admission: RE | Admit: 2014-06-26 | Discharge: 2014-06-26 | Disposition: A | Discharge status: Home / Self Care | Payer: Medicare HMO | Source: Ambulatory Visit | Attending: Radiation Oncology | Admitting: Radiation Oncology

## 2014-06-26 DIAGNOSIS — C3412 Malignant neoplasm of upper lobe, left bronchus or lung: Secondary | ICD-10-CM | POA: Diagnosis not present

## 2014-06-28 ENCOUNTER — Other Ambulatory Visit: Payer: Self-pay | Admitting: Oncology

## 2014-06-29 ENCOUNTER — Ambulatory Visit: Admission: RE | Admit: 2014-06-29 | Payer: Medicare HMO | Source: Ambulatory Visit

## 2014-06-30 ENCOUNTER — Inpatient Hospital Stay
Admission: RE | Admit: 2014-06-30 | Discharge: 2014-06-30 | Disposition: A | Discharge status: Home / Self Care | Payer: Self-pay | Source: Ambulatory Visit | Attending: Radiation Oncology | Admitting: Radiation Oncology

## 2014-06-30 ENCOUNTER — Ambulatory Visit
Admission: RE | Admit: 2014-06-30 | Discharge: 2014-06-30 | Disposition: A | Discharge status: Home / Self Care | Payer: Medicare HMO | Source: Ambulatory Visit | Attending: Radiation Oncology | Admitting: Radiation Oncology

## 2014-06-30 DIAGNOSIS — C3412 Malignant neoplasm of upper lobe, left bronchus or lung: Secondary | ICD-10-CM | POA: Diagnosis not present

## 2014-07-01 ENCOUNTER — Ambulatory Visit
Admission: RE | Admit: 2014-07-01 | Discharge: 2014-07-01 | Disposition: A | Discharge status: Home / Self Care | Payer: Medicare HMO | Source: Ambulatory Visit | Attending: Radiation Oncology | Admitting: Radiation Oncology

## 2014-07-01 DIAGNOSIS — C3412 Malignant neoplasm of upper lobe, left bronchus or lung: Secondary | ICD-10-CM | POA: Diagnosis not present

## 2014-07-02 ENCOUNTER — Inpatient Hospital Stay: Payer: Medicare HMO

## 2014-07-02 ENCOUNTER — Inpatient Hospital Stay (HOSPITAL_BASED_OUTPATIENT_CLINIC_OR_DEPARTMENT_OTHER): Payer: Medicare HMO | Admitting: Oncology

## 2014-07-02 ENCOUNTER — Ambulatory Visit
Admission: RE | Admit: 2014-07-02 | Discharge: 2014-07-02 | Disposition: A | Discharge status: Home / Self Care | Payer: Medicare HMO | Source: Ambulatory Visit | Attending: Radiation Oncology | Admitting: Radiation Oncology

## 2014-07-02 VITALS — BP 119/63 | HR 77 | Temp 97.2°F | Wt 223.8 lb

## 2014-07-02 DIAGNOSIS — Z79899 Other long term (current) drug therapy: Secondary | ICD-10-CM

## 2014-07-02 DIAGNOSIS — C3412 Malignant neoplasm of upper lobe, left bronchus or lung: Secondary | ICD-10-CM | POA: Diagnosis not present

## 2014-07-02 DIAGNOSIS — Z923 Personal history of irradiation: Secondary | ICD-10-CM | POA: Diagnosis not present

## 2014-07-02 DIAGNOSIS — I2510 Atherosclerotic heart disease of native coronary artery without angina pectoris: Secondary | ICD-10-CM

## 2014-07-02 DIAGNOSIS — Z951 Presence of aortocoronary bypass graft: Secondary | ICD-10-CM

## 2014-07-02 DIAGNOSIS — Z87891 Personal history of nicotine dependence: Secondary | ICD-10-CM

## 2014-07-02 DIAGNOSIS — G8929 Other chronic pain: Secondary | ICD-10-CM

## 2014-07-02 DIAGNOSIS — R05 Cough: Secondary | ICD-10-CM | POA: Diagnosis not present

## 2014-07-02 DIAGNOSIS — K219 Gastro-esophageal reflux disease without esophagitis: Secondary | ICD-10-CM

## 2014-07-02 DIAGNOSIS — E119 Type 2 diabetes mellitus without complications: Secondary | ICD-10-CM

## 2014-07-02 DIAGNOSIS — C3402 Malignant neoplasm of left main bronchus: Secondary | ICD-10-CM

## 2014-07-02 DIAGNOSIS — M1990 Unspecified osteoarthritis, unspecified site: Secondary | ICD-10-CM

## 2014-07-02 DIAGNOSIS — Z5111 Encounter for antineoplastic chemotherapy: Secondary | ICD-10-CM | POA: Diagnosis not present

## 2014-07-02 DIAGNOSIS — M549 Dorsalgia, unspecified: Secondary | ICD-10-CM

## 2014-07-02 LAB — CBC WITH DIFFERENTIAL/PLATELET
BASOS ABS: 0 10*3/uL (ref 0–0.1)
BASOS PCT: 0 %
Eosinophils Absolute: 0 10*3/uL (ref 0–0.7)
Eosinophils Relative: 1 %
HEMATOCRIT: 32.7 % — AB (ref 40.0–52.0)
Hemoglobin: 10.5 g/dL — ABNORMAL LOW (ref 13.0–18.0)
Lymphocytes Relative: 10 %
Lymphs Abs: 0.4 10*3/uL — ABNORMAL LOW (ref 1.0–3.6)
MCH: 28.4 pg (ref 26.0–34.0)
MCHC: 32.2 g/dL (ref 32.0–36.0)
MCV: 88 fL (ref 80.0–100.0)
Monocytes Absolute: 0.4 10*3/uL (ref 0.2–1.0)
Monocytes Relative: 10 %
NEUTROS ABS: 3.3 10*3/uL (ref 1.4–6.5)
Neutrophils Relative %: 79 %
Platelets: 131 10*3/uL — ABNORMAL LOW (ref 150–440)
RBC: 3.71 MIL/uL — ABNORMAL LOW (ref 4.40–5.90)
RDW: 18 % — AB (ref 11.5–14.5)
WBC: 4.2 10*3/uL (ref 3.8–10.6)

## 2014-07-02 LAB — COMPREHENSIVE METABOLIC PANEL
ALBUMIN: 3.7 g/dL (ref 3.5–5.0)
ALK PHOS: 81 U/L (ref 38–126)
ALT: 38 U/L (ref 17–63)
AST: 31 U/L (ref 15–41)
Anion gap: 7 (ref 5–15)
BUN: 31 mg/dL — ABNORMAL HIGH (ref 6–20)
CHLORIDE: 106 mmol/L (ref 101–111)
CO2: 22 mmol/L (ref 22–32)
Calcium: 8.3 mg/dL — ABNORMAL LOW (ref 8.9–10.3)
Creatinine, Ser: 1.32 mg/dL — ABNORMAL HIGH (ref 0.61–1.24)
GFR calc Af Amer: 60 mL/min — ABNORMAL LOW (ref >60)
GFR calc non Af Amer: 51 mL/min — ABNORMAL LOW (ref >60)
Glucose, Bld: 213 mg/dL — ABNORMAL HIGH (ref 65–99)
POTASSIUM: 4.5 mmol/L (ref 3.5–5.1)
SODIUM: 135 mmol/L (ref 135–145)
Total Bilirubin: 0.5 mg/dL (ref 0.3–1.2)
Total Protein: 6.7 g/dL (ref 6.5–8.1)

## 2014-07-02 MED ORDER — FAMOTIDINE IN NACL 20-0.9 MG/50ML-% IV SOLN
20.0000 mg | Freq: Once | INTRAVENOUS | Status: AC
  Administered 2014-07-02: 20 mg via INTRAVENOUS
  Filled 2014-07-02: qty 50

## 2014-07-02 MED ORDER — SODIUM CHLORIDE 0.9 % IV SOLN
Freq: Once | INTRAVENOUS | Status: AC
  Administered 2014-07-02: 13:00:00 via INTRAVENOUS
  Filled 2014-07-02: qty 8

## 2014-07-02 MED ORDER — PACLITAXEL CHEMO INJECTION 300 MG/50ML
45.0000 mg/m2 | Freq: Once | INTRAVENOUS | Status: AC
  Administered 2014-07-02: 102 mg via INTRAVENOUS
  Filled 2014-07-02: qty 17

## 2014-07-02 MED ORDER — SODIUM CHLORIDE 0.9 % IV SOLN
Freq: Once | INTRAVENOUS | Status: AC
  Administered 2014-07-02: 12:00:00 via INTRAVENOUS
  Filled 2014-07-02: qty 1000

## 2014-07-02 MED ORDER — SODIUM CHLORIDE 0.9 % IJ SOLN
10.0000 mL | INTRAMUSCULAR | Status: DC | PRN
  Administered 2014-07-02: 10 mL via INTRAVENOUS
  Filled 2014-07-02: qty 10

## 2014-07-02 MED ORDER — SODIUM CHLORIDE 0.9 % IV SOLN
190.0000 mg | Freq: Once | INTRAVENOUS | Status: AC
  Administered 2014-07-02: 190 mg via INTRAVENOUS
  Filled 2014-07-02: qty 19

## 2014-07-02 MED ORDER — DIPHENHYDRAMINE HCL 50 MG/ML IJ SOLN
50.0000 mg | Freq: Once | INTRAMUSCULAR | Status: AC
  Administered 2014-07-02: 50 mg via INTRAVENOUS
  Filled 2014-07-02: qty 1

## 2014-07-02 MED ORDER — HEPARIN SOD (PORK) LOCK FLUSH 100 UNIT/ML IV SOLN
500.0000 [IU] | Freq: Once | INTRAVENOUS | Status: AC
  Administered 2014-07-02: 500 [IU] via INTRAVENOUS
  Filled 2014-07-02: qty 5

## 2014-07-02 NOTE — Progress Notes (Signed)
Patient does have living will.  Former smoker.

## 2014-07-03 ENCOUNTER — Ambulatory Visit: Admission: RE | Admit: 2014-07-03 | Payer: Medicare HMO | Source: Ambulatory Visit

## 2014-07-03 MED ORDER — BENZONATATE 100 MG PO CAPS: 100.0000 mg | ORAL_CAPSULE | Freq: Three times a day (TID) | ORAL | Status: DC | PRN

## 2014-07-03 MED ORDER — CHERATUSSIN AC 100-10 MG/5ML PO SYRP: 5.0000 mL | ORAL_SOLUTION | Freq: Three times a day (TID) | ORAL | Status: DC | PRN

## 2014-07-06 ENCOUNTER — Encounter: Payer: Self-pay | Admitting: Oncology

## 2014-07-06 ENCOUNTER — Ambulatory Visit
Admission: RE | Admit: 2014-07-06 | Discharge: 2014-07-06 | Disposition: A | Discharge status: Home / Self Care | Payer: Medicare HMO | Source: Ambulatory Visit | Attending: Radiation Oncology | Admitting: Radiation Oncology

## 2014-07-06 DIAGNOSIS — C3412 Malignant neoplasm of upper lobe, left bronchus or lung: Secondary | ICD-10-CM | POA: Diagnosis not present

## 2014-07-06 NOTE — Progress Notes (Signed)
[place] @ [facility] [phone]  Fax:([phone]) [place]: 24-Feb-1940  MR#: [id number]  CHE#:[id number]  Patient Care Team: Albina Billet, MD as PCP - General (Internal Medicine)  CHIEF COMPLAINT:  No chief complaint on file.   Oncology History   74 year old gentleman with left hilar mass and mediastinal adenopathy Chronic smoker quit smoking in 2000 Non-insulin-dependent diabetes History of coronary artery disease with status post bypass surgery done in year 2000 1.  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  Patient is starting carboplatinum and Taxol with AUC of 2 and 45 mg/m respectively concurrent with radiation therapy (May 27, 2014) 3.  He has finished total 6 cycles of carboplatinum and Taxol weekly chemotherapy on July 02, 2014     Cancer of lung    Cancer Diagnosis     Oncology Flowsheet 05/20/2014 05/27/2014 06/03/2014 06/10/2014 06/17/2014 06/24/2014 07/02/2014  Day, Cycle - Day 1, Cycle 1 Day 1, Cycle 2 Day 1, Cycle 3 Day 1, Cycle 1 Day 1, Cycle 2 Day 1, Cycle 3  CARBOplatin (PARAPLATIN) IV - 230 mg 190 mg 210 mg 210 mg 210 mg 190 mg  dexamethasone (DECADRON) IV - [ 12 mg ] [ 12 mg ] [ 12 mg ] [ 12 mg ] [ 20 mg ] [ 20 mg ]  fosaprepitant (EMEND) IV - [ 150 mg ] [ 150 mg ] [ 150 mg ] [ 150 mg ] - -  ondansetron (ZOFRAN) IJ - - - - - - -  ondansetron (ZOFRAN) IV - - - - - [ 16 mg ] [ 16 mg ]  PACLitaxel (TAXOL) IV - 45 mg/m2 45 mg/m2 45 mg/m2 45 mg/m2 45 mg/m2 45 mg/m2  palonosetron (ALOXI) IV - 0.25 mg 0.25 mg 0.25 mg 0.25 mg - -    INTERVAL HISTORY: Patient underwent EBUS and biopsy was positive for squamous cell carcinoma of lung.  Was stages IIIa disease.  Here for further follow-up and treatment consideration.  Had one episode of hemoptysis after bronchoscopy .  Patient was advised to stop taking aspirin.  Hemoptysis has resolved. Dry hacking cough  continues .  June 10, 2014 Patient has started radiation therapy.  Tolerating chemotherapy and radiation therapy very well.  Prednisone is helping with off.  No tingling.  No numbness.  No soreness in the mouth June, 2016 Patient is here to initiate 6 and the last chemotherapy cycle.  Continue radiation therapy which will finish in next 2 weeks.  No nausea no vomiting no diarrhea is no soreness in the mouth dry hacking cough is persistent  REVIEW OF SYSTEMS:   ROS Gen. status: Patient is alert oriented not any acute distress. Head exam was generally normal. There was no scleral icterus or corneal arcus. Mucous membranes were moist.  Lungs: Emphysematous chest occasional rhonchi.  Significant improvement in wheezing.Cardiac exam revealed the PMI to be normally situated and sized. The rhythm was regular and no extrasystoles were noted during several minutes of auscultation. The first and second heart sounds were normal and physiologic splitting of the second heart sound was noted. There were no murmurs, rubs, clicks, or gallops.Abdominal exam revealed normal bowel sounds. The abdomen was soft, non-tender, and without masses, organomegaly, or appreciable enlargement of the abdominal aorta.Examination of the skin revealed no evidence of significant rashes, suspicious appearing nevi or other  concerning lesions..  Neurologically, the patient was awake, alert, and oriented to person, place and time. There were no obvious focal neurologic abnormalities.  Lower extremity no edema. As per HPI. Otherwise, a complete review of systems is negatve.  PAST MEDICAL HISTORY: Past Medical History  Diagnosis Date  . Coronary artery disease   . Pneumonia 2000  . Arthritis   . 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 Bruises easily   . GERD (gastroesophageal reflux disease)     takes Omeprazole daily as needed for stomach pain  . Blood transfusion 2001  . Diabetes mellitus     takes Metformin daily;  .  Impaired hearing     bil hearing aide  . Macular degeneration     being watched for this but hasn't been "completely" diagnosed  . Myocardial infarction 2001    PAST SURGICAL HISTORY: Past Surgical History  Procedure Laterality Date  . 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 Cardiac catheterization  2001  . Tonsillectomy      as a child  . Colonoscopy    . Cataract extraction      bilateral  . Lumbar laminectomy/decompression microdiscectomy  12/19/2010    Procedure: LUMBAR LAMINECTOMY/DECOMPRESSION MICRODISCECTOMY;  Surgeon: Elaina Hoops;  Location: [place];  Service: Neurosurgery;  Laterality: N/A;  Lumbar three-four, four-five decompressive lumbar laminectomy  . Joint replacement  right tkr  . Eye surgery      cataracts  . Back surgery      l4 5  . Portacath placement N/A 05/20/2014    Procedure: INSERTION PORT-A-CATH;  Surgeon: Nestor Lewandowsky, MD;  Location: [facility] ORS;  Service: General;  Laterality: N/A;    FAMILY HISTORY Family History  Problem Relation Age of Onset  . Anesthesia problems Neg Hx   . Hypotension Neg Hx   . Malignant hyperthermia Neg Hx   . Pseudochol deficiency Neg Hx         ADVANCED DIRECTIVES: Patient does have advanced health care directive   HEALTH MAINTENANCE: History  Substance Use Topics  . Smoking status: Former Smoker -- 40 years    Types: Cigarettes    Quit date: 05/19/1999  . Smokeless tobacco: Never Used     Comment: 2001  . Alcohol Use: No     No Known Allergies  Current Outpatient Prescriptions  Medication Sig Dispense Refill  . albuterol (PROVENTIL HFA;VENTOLIN HFA) 108 (90 BASE) MCG/ACT inhaler Inhale 2 puffs into the lungs as needed for wheezing or shortness of breath.     . Alcohol Swabs (B-D SINGLE USE SWABS REGULAR) PADS     . ANORO ELLIPTA 62.5-25 MCG/INH AEPB   11  . aspirin 81 MG tablet     . benzonatate (TESSALON) 100 MG capsule Take 1 capsule (100 mg total) by mouth 3 (three) times daily as  needed for cough. Apply co-pay to [facility] per Ulice Dash. 90 capsule 0  . beta carotene w/minerals (OCUVITE) tablet Take 1 tablet by mouth 2 (two) times daily.    . Blood Glucose Calibration (TRUETEST CONTROL LEVEL 1) LIQD     . CHERATUSSIN AC 100-10 MG/5ML syrup Take 5 mLs by mouth 3 (three) times daily as needed for cough. Apply co-pay to [facility] per Ulice Dash. 120 mL 0  . dexamethasone (DECADRON) 4 MG tablet Take 2 tablets (8 mg total) by mouth 2 (two) times daily with a meal. Start the day after  chemotherapy for 3 days. 30 tablet 1  . Dextromethorphan-Guaifenesin (ROBITUSSIN DM) 10-100 MG/5ML liquid Take 5 mLs by mouth every 12 (twelve) hours. 240 mL 1  . glimepiride (AMARYL) 1 MG tablet     . glucose blood test strip     . HYDROcodone-acetaminophen (NORCO/VICODIN) 5-325 MG per tablet Take by mouth.    [employer] Devices (TRUEDRAW LANCING DEVICE) MISC     . lidocaine-prilocaine (EMLA) cream     . magnesium oxide (MAG-OX) 400 MG tablet Take 400 mg by mouth daily.      . Melatonin 5 MG CAPS Take 1 capsule (5 mg total) by mouth at bedtime as needed.  0  . metFORMIN (GLUCOPHAGE) 500 MG tablet Take 500 mg by mouth daily. Take '1000mg'$  po in morning, take '500mg'$  po at bedtime    . metoprolol (LOPRESSOR) 50 MG tablet 50 mg 2 (two) times daily.    . naproxen (NAPROSYN) 500 MG tablet     . omeprazole (PRILOSEC) 40 MG capsule Take 40 mg by mouth daily as needed. For acid reflux      . ondansetron (ZOFRAN) 8 MG tablet Take 1 tablet (8 mg total) by mouth 2 (two) times daily. Start the day after chemo for 3 days. Then take as needed for nausea or vomiting. (Patient not taking: Reported on 07/02/2014) 30 tablet 1  . predniSONE (DELTASONE) 20 MG tablet TAKE 1 TABLET (20 MG TOTAL) BY MOUTH DAILY WITH BREAKFAST. TAKE FOR 15 DAYS. 15 tablet 0  . simvastatin (ZOCOR) 40 MG tablet Take 40 mg by mouth at bedtime.      . SYMBICORT 160-4.5 MCG/ACT inhaler   12  . TRUEPLUS LANCETS 28G MISC     . zolpidem (AMBIEN)  10 MG tablet Take 1 tablet (10 mg total) by mouth at bedtime as needed for sleep. 30 tablet 0   No current facility-administered medications for this visit.   Facility-Administered Medications Ordered in Other Visits  Medication Dose Route Frequency Provider Last Rate Last Dose  . lidocaine-prilocaine (EMLA) cream   Topical Once Evlyn Kanner, NP      . sodium chloride 0.9 % injection 10 mL  10 mL Intracatheter PRN Forest Gleason, MD   10 mL at 06/03/14 1106    OBJECTIVE: There were no vitals filed for this visit.   There is no weight on file to calculate BMI.    ECOG FS:1 - Symptomatic but completely ambulatory  Physical Exam  Constitutional: No distress.  HENT:  Head: Normocephalic and atraumatic.  Right Ear: External ear normal.  Left Ear: External ear normal.  Nose: Nose normal.  Mouth/Throat: Oropharynx is clear and moist.  Eyes: Conjunctivae and EOM are normal. Pupils are equal, round, and reactive to light.  Neck: Normal range of motion. Neck supple. No JVD present. No tracheal deviation present. No thyromegaly present.  Cardiovascular: Normal rate, normal heart sounds and intact distal pulses.  Exam reveals no gallop and no friction rub.   No murmur heard. Pulmonary/Chest: No stridor. No respiratory distress. He has no wheezes. He has no rales. He exhibits no tenderness.  Abdominal: He exhibits no distension and no mass. There is no tenderness. There is no rebound and no guarding.  Musculoskeletal: He exhibits no edema or tenderness.  Neurological: He displays normal reflexes. No cranial nerve deficit. He exhibits normal muscle tone. Coordination normal.  Skin: No rash noted. He is not diaphoretic. No erythema.  Psychiatric: Memory, affect and judgment normal.  Nursing note and vitals  reviewed.    LAB RESULTS:     Component Value Date/Time   NA 135 07/02/2014 0950   NA 134* 04/30/2014 0938   K 4.5 07/02/2014 0950   K 5.0 04/30/2014 0938   CL 106 07/02/2014 0950    CL 102 04/30/2014 0938   CO2 22 07/02/2014 0950   CO2 25 04/30/2014 0938   GLUCOSE 213* 07/02/2014 0950   GLUCOSE 225* 04/30/2014 0938   BUN 31* 07/02/2014 0950   BUN 25* 04/30/2014 0938   CREATININE 1.32* 07/02/2014 0950   CREATININE 1.20 04/30/2014 0938   CALCIUM 8.3* 07/02/2014 0950   CALCIUM 9.2 04/30/2014 0938   PROT 6.7 07/02/2014 0950   PROT 7.5 04/30/2014 0938   ALBUMIN 3.7 07/02/2014 0950   ALBUMIN 3.7 04/30/2014 0938   AST 31 07/02/2014 0950   AST 46* 04/30/2014 0938   ALT 38 07/02/2014 0950   ALT 40 04/30/2014 0938   ALKPHOS 81 07/02/2014 0950   ALKPHOS 104 04/30/2014 0938   BILITOT 0.5 07/02/2014 0950   GFRNONAA 51* 07/02/2014 0950   GFRNONAA 59* 04/30/2014 0938   GFRAA 60* 07/02/2014 0950   GFRAA >60 04/30/2014 0938    No results found for: SPEP, UPEP  Lab Results  Component Value Date   WBC 4.2 07/02/2014   NEUTROABS 3.3 07/02/2014   HGB 10.5* 07/02/2014   HCT 32.7* 07/02/2014   MCV 88.0 07/02/2014   PLT 131* 07/02/2014      Chemistry      Component Value Date/Time   NA 135 07/02/2014 0950   NA 134* 04/30/2014 0938   K 4.5 07/02/2014 0950   K 5.0 04/30/2014 0938   CL 106 07/02/2014 0950   CL 102 04/30/2014 0938   CO2 22 07/02/2014 0950   CO2 25 04/30/2014 0938   BUN 31* 07/02/2014 0950   BUN 25* 04/30/2014 0938   CREATININE 1.32* 07/02/2014 0950   CREATININE 1.20 04/30/2014 0938      Component Value Date/Time   CALCIUM 8.3* 07/02/2014 0950   CALCIUM 9.2 04/30/2014 0938   ALKPHOS 81 07/02/2014 0950   ALKPHOS 104 04/30/2014 0938   AST 31 07/02/2014 0950   AST 46* 04/30/2014 0938   ALT 38 07/02/2014 0950   ALT 40 04/30/2014 0938   BILITOT 0.5 07/02/2014 0950       ASSESSMENT: \ 1.  Carcinoma of lung left upper lobe.  Stage IIIa squamous cell carcinoma.  Initiate treatment.  All lab data has been reviewed. Continue radiation and chemotherapy.   Dry hacking cough  All lab data has been reviewed.  Will continue 6 and the last cycle of  chemotherapy Reevaluation in 4 weeks after which a PET scan will be scheduled restaging Patient has finished oral 6 cycles of chemotherapy with carboplatinum and Taxol   No significant side effect Patient expressed understanding and was in agreement with this plan. He also understands that He can call clinic at any time with any questions, concerns, or complaints.    Cancer of lung   Staging form: Lung, AJCC 7th Edition     Clinical: T2, N2, M0 - Unsigned   Forest Gleason, MD   07/06/2014 8:10 AM

## 2014-07-07 ENCOUNTER — Ambulatory Visit
Admission: RE | Admit: 2014-07-07 | Discharge: 2014-07-07 | Disposition: A | Discharge status: Home / Self Care | Payer: Medicare HMO | Source: Ambulatory Visit | Attending: Radiation Oncology | Admitting: Radiation Oncology

## 2014-07-07 ENCOUNTER — Other Ambulatory Visit: Payer: Self-pay | Admitting: *Deleted

## 2014-07-07 ENCOUNTER — Inpatient Hospital Stay
Admission: RE | Admit: 2014-07-07 | Discharge: 2014-07-07 | Disposition: A | Discharge status: Home / Self Care | Payer: Self-pay | Source: Ambulatory Visit | Attending: Radiation Oncology | Admitting: Radiation Oncology

## 2014-07-07 DIAGNOSIS — C3412 Malignant neoplasm of upper lobe, left bronchus or lung: Secondary | ICD-10-CM | POA: Diagnosis not present

## 2014-07-07 MED ORDER — SUCRALFATE 1 G PO TABS: 1.0000 g | ORAL_TABLET | Freq: Three times a day (TID) | ORAL | Status: DC

## 2014-07-08 ENCOUNTER — Other Ambulatory Visit: Payer: Medicare HMO

## 2014-07-08 ENCOUNTER — Ambulatory Visit
Admission: RE | Admit: 2014-07-08 | Discharge: 2014-07-08 | Disposition: A | Discharge status: Home / Self Care | Payer: Medicare HMO | Source: Ambulatory Visit | Attending: Radiation Oncology | Admitting: Radiation Oncology

## 2014-07-08 ENCOUNTER — Other Ambulatory Visit: Payer: Self-pay | Admitting: *Deleted

## 2014-07-08 ENCOUNTER — Ambulatory Visit: Payer: Medicare HMO | Admitting: Oncology

## 2014-07-08 DIAGNOSIS — C3402 Malignant neoplasm of left main bronchus: Secondary | ICD-10-CM

## 2014-07-08 DIAGNOSIS — C3412 Malignant neoplasm of upper lobe, left bronchus or lung: Secondary | ICD-10-CM | POA: Diagnosis not present

## 2014-07-08 LAB — COMPREHENSIVE METABOLIC PANEL
ALT: 31 U/L (ref 17–63)
AST: 27 U/L (ref 15–41)
Albumin: 3.4 g/dL — ABNORMAL LOW (ref 3.5–5.0)
Alkaline Phosphatase: 79 U/L (ref 38–126)
Anion gap: 8 (ref 5–15)
BUN: 28 mg/dL — ABNORMAL HIGH (ref 6–20)
CALCIUM: 8.6 mg/dL — AB (ref 8.9–10.3)
CO2: 22 mmol/L (ref 22–32)
Chloride: 103 mmol/L (ref 101–111)
Creatinine, Ser: 1.33 mg/dL — ABNORMAL HIGH (ref 0.61–1.24)
GFR calc Af Amer: 59 mL/min — ABNORMAL LOW (ref >60)
GFR, EST NON AFRICAN AMERICAN: 51 mL/min — AB (ref >60)
Glucose, Bld: 262 mg/dL — ABNORMAL HIGH (ref 65–99)
Potassium: 4.9 mmol/L (ref 3.5–5.1)
SODIUM: 133 mmol/L — AB (ref 135–145)
TOTAL PROTEIN: 6.7 g/dL (ref 6.5–8.1)
Total Bilirubin: 0.7 mg/dL (ref 0.3–1.2)

## 2014-07-08 LAB — CBC WITH DIFFERENTIAL/PLATELET
BASOS PCT: 0 %
Basophils Absolute: 0 10*3/uL (ref 0–0.1)
EOS ABS: 0 10*3/uL (ref 0–0.7)
EOS PCT: 1 %
HCT: 32.6 % — ABNORMAL LOW (ref 40.0–52.0)
Hemoglobin: 10.4 g/dL — ABNORMAL LOW (ref 13.0–18.0)
LYMPHS ABS: 0.3 10*3/uL — AB (ref 1.0–3.6)
Lymphocytes Relative: 14 %
MCH: 28.6 pg (ref 26.0–34.0)
MCHC: 31.9 g/dL — AB (ref 32.0–36.0)
MCV: 89.5 fL (ref 80.0–100.0)
MONOS PCT: 9 %
Monocytes Absolute: 0.2 10*3/uL (ref 0.2–1.0)
Neutro Abs: 1.7 10*3/uL (ref 1.4–6.5)
Neutrophils Relative %: 76 %
PLATELETS: 163 10*3/uL (ref 150–440)
RBC: 3.64 MIL/uL — AB (ref 4.40–5.90)
RDW: 19.3 % — ABNORMAL HIGH (ref 11.5–14.5)
WBC: 2.2 10*3/uL — ABNORMAL LOW (ref 3.8–10.6)

## 2014-07-09 ENCOUNTER — Ambulatory Visit
Admission: RE | Admit: 2014-07-09 | Discharge: 2014-07-09 | Disposition: A | Discharge status: Home / Self Care | Payer: Medicare HMO | Source: Ambulatory Visit | Attending: Radiation Oncology | Admitting: Radiation Oncology

## 2014-07-09 DIAGNOSIS — C3412 Malignant neoplasm of upper lobe, left bronchus or lung: Secondary | ICD-10-CM | POA: Diagnosis not present

## 2014-07-10 ENCOUNTER — Ambulatory Visit
Admission: RE | Admit: 2014-07-10 | Discharge: 2014-07-10 | Disposition: A | Discharge status: Home / Self Care | Payer: Medicare HMO | Source: Ambulatory Visit | Attending: Radiation Oncology | Admitting: Radiation Oncology

## 2014-07-10 DIAGNOSIS — C3412 Malignant neoplasm of upper lobe, left bronchus or lung: Secondary | ICD-10-CM | POA: Diagnosis not present

## 2014-07-14 ENCOUNTER — Inpatient Hospital Stay
Admission: RE | Admit: 2014-07-14 | Discharge: 2014-07-14 | Disposition: A | Discharge status: Home / Self Care | Payer: Self-pay | Source: Ambulatory Visit | Attending: Radiation Oncology | Admitting: Radiation Oncology

## 2014-07-14 ENCOUNTER — Ambulatory Visit
Admission: RE | Admit: 2014-07-14 | Discharge: 2014-07-14 | Disposition: A | Discharge status: Home / Self Care | Payer: Medicare HMO | Source: Ambulatory Visit | Attending: Radiation Oncology | Admitting: Radiation Oncology

## 2014-07-14 DIAGNOSIS — C3412 Malignant neoplasm of upper lobe, left bronchus or lung: Secondary | ICD-10-CM | POA: Diagnosis not present

## 2014-07-15 ENCOUNTER — Ambulatory Visit
Admission: RE | Admit: 2014-07-15 | Discharge: 2014-07-15 | Disposition: A | Discharge status: Home / Self Care | Payer: Medicare HMO | Source: Ambulatory Visit | Attending: Radiation Oncology | Admitting: Radiation Oncology

## 2014-07-15 DIAGNOSIS — C3412 Malignant neoplasm of upper lobe, left bronchus or lung: Secondary | ICD-10-CM | POA: Diagnosis not present

## 2014-07-16 ENCOUNTER — Ambulatory Visit
Admission: RE | Admit: 2014-07-16 | Discharge: 2014-07-16 | Disposition: A | Discharge status: Home / Self Care | Payer: Medicare HMO | Source: Ambulatory Visit | Attending: Radiation Oncology | Admitting: Radiation Oncology

## 2014-07-16 DIAGNOSIS — C3412 Malignant neoplasm of upper lobe, left bronchus or lung: Secondary | ICD-10-CM | POA: Diagnosis not present

## 2014-07-17 ENCOUNTER — Other Ambulatory Visit: Payer: Medicare HMO

## 2014-07-17 ENCOUNTER — Other Ambulatory Visit: Payer: Self-pay | Admitting: *Deleted

## 2014-07-17 ENCOUNTER — Inpatient Hospital Stay: Payer: Medicare HMO | Attending: Radiation Oncology

## 2014-07-17 ENCOUNTER — Ambulatory Visit
Admission: RE | Admit: 2014-07-17 | Discharge: 2014-07-17 | Disposition: A | Discharge status: Home / Self Care | Payer: Medicare HMO | Source: Ambulatory Visit | Attending: Radiation Oncology | Admitting: Radiation Oncology

## 2014-07-17 DIAGNOSIS — Z9221 Personal history of antineoplastic chemotherapy: Secondary | ICD-10-CM | POA: Insufficient documentation

## 2014-07-17 DIAGNOSIS — I2510 Atherosclerotic heart disease of native coronary artery without angina pectoris: Secondary | ICD-10-CM | POA: Insufficient documentation

## 2014-07-17 DIAGNOSIS — C3412 Malignant neoplasm of upper lobe, left bronchus or lung: Secondary | ICD-10-CM | POA: Diagnosis present

## 2014-07-17 DIAGNOSIS — Z7952 Long term (current) use of systemic steroids: Secondary | ICD-10-CM | POA: Diagnosis not present

## 2014-07-17 DIAGNOSIS — E119 Type 2 diabetes mellitus without complications: Secondary | ICD-10-CM | POA: Insufficient documentation

## 2014-07-17 DIAGNOSIS — Z7982 Long term (current) use of aspirin: Secondary | ICD-10-CM | POA: Diagnosis not present

## 2014-07-17 DIAGNOSIS — I252 Old myocardial infarction: Secondary | ICD-10-CM | POA: Insufficient documentation

## 2014-07-17 DIAGNOSIS — Z794 Long term (current) use of insulin: Secondary | ICD-10-CM | POA: Diagnosis not present

## 2014-07-17 DIAGNOSIS — M549 Dorsalgia, unspecified: Secondary | ICD-10-CM | POA: Diagnosis not present

## 2014-07-17 DIAGNOSIS — Z923 Personal history of irradiation: Secondary | ICD-10-CM | POA: Insufficient documentation

## 2014-07-17 DIAGNOSIS — G8929 Other chronic pain: Secondary | ICD-10-CM | POA: Diagnosis not present

## 2014-07-17 DIAGNOSIS — Z85118 Personal history of other malignant neoplasm of bronchus and lung: Secondary | ICD-10-CM

## 2014-07-17 DIAGNOSIS — Z79899 Other long term (current) drug therapy: Secondary | ICD-10-CM | POA: Diagnosis not present

## 2014-07-17 DIAGNOSIS — Z951 Presence of aortocoronary bypass graft: Secondary | ICD-10-CM | POA: Insufficient documentation

## 2014-07-17 DIAGNOSIS — M1990 Unspecified osteoarthritis, unspecified site: Secondary | ICD-10-CM | POA: Insufficient documentation

## 2014-07-17 DIAGNOSIS — Z87891 Personal history of nicotine dependence: Secondary | ICD-10-CM | POA: Diagnosis not present

## 2014-07-17 DIAGNOSIS — J439 Emphysema, unspecified: Secondary | ICD-10-CM | POA: Diagnosis not present

## 2014-07-17 DIAGNOSIS — J984 Other disorders of lung: Secondary | ICD-10-CM

## 2014-07-17 LAB — GLUCOSE, RANDOM: Glucose, Bld: 188 mg/dL — ABNORMAL HIGH (ref 65–99)

## 2014-07-20 ENCOUNTER — Ambulatory Visit: Payer: Medicare HMO

## 2014-07-20 DIAGNOSIS — C3412 Malignant neoplasm of upper lobe, left bronchus or lung: Secondary | ICD-10-CM | POA: Diagnosis not present

## 2014-07-21 ENCOUNTER — Ambulatory Visit: Payer: Medicare HMO

## 2014-07-21 ENCOUNTER — Other Ambulatory Visit: Payer: Self-pay | Admitting: *Deleted

## 2014-07-21 DIAGNOSIS — C3412 Malignant neoplasm of upper lobe, left bronchus or lung: Secondary | ICD-10-CM | POA: Diagnosis not present

## 2014-07-21 DIAGNOSIS — Z85118 Personal history of other malignant neoplasm of bronchus and lung: Secondary | ICD-10-CM

## 2014-07-28 ENCOUNTER — Ambulatory Visit
Admission: RE | Admit: 2014-07-28 | Discharge: 2014-07-28 | Disposition: A | Discharge status: Home / Self Care | Payer: Medicare HMO | Source: Ambulatory Visit | Attending: Radiation Oncology | Admitting: Radiation Oncology

## 2014-07-28 DIAGNOSIS — I639 Cerebral infarction, unspecified: Secondary | ICD-10-CM | POA: Diagnosis not present

## 2014-07-28 DIAGNOSIS — R42 Dizziness and giddiness: Secondary | ICD-10-CM | POA: Diagnosis present

## 2014-07-28 DIAGNOSIS — Z85118 Personal history of other malignant neoplasm of bronchus and lung: Secondary | ICD-10-CM | POA: Insufficient documentation

## 2014-07-28 DIAGNOSIS — I679 Cerebrovascular disease, unspecified: Secondary | ICD-10-CM | POA: Diagnosis not present

## 2014-07-28 MED ORDER — GADOBENATE DIMEGLUMINE 529 MG/ML IV SOLN
20.0000 mL | Freq: Once | INTRAVENOUS | Status: AC | PRN
  Administered 2014-07-28: 20 mL via INTRAVENOUS

## 2014-07-29 ENCOUNTER — Other Ambulatory Visit: Payer: Self-pay | Admitting: *Deleted

## 2014-07-29 DIAGNOSIS — C3402 Malignant neoplasm of left main bronchus: Secondary | ICD-10-CM

## 2014-07-30 ENCOUNTER — Inpatient Hospital Stay (HOSPITAL_BASED_OUTPATIENT_CLINIC_OR_DEPARTMENT_OTHER): Payer: Medicare HMO | Admitting: Oncology

## 2014-07-30 ENCOUNTER — Inpatient Hospital Stay: Payer: Medicare HMO

## 2014-07-30 ENCOUNTER — Encounter: Payer: Self-pay | Admitting: Oncology

## 2014-07-30 VITALS — BP 116/68 | HR 80 | Temp 97.7°F | Wt 224.2 lb

## 2014-07-30 DIAGNOSIS — Z7982 Long term (current) use of aspirin: Secondary | ICD-10-CM

## 2014-07-30 DIAGNOSIS — Z87891 Personal history of nicotine dependence: Secondary | ICD-10-CM | POA: Diagnosis not present

## 2014-07-30 DIAGNOSIS — I252 Old myocardial infarction: Secondary | ICD-10-CM

## 2014-07-30 DIAGNOSIS — Z9221 Personal history of antineoplastic chemotherapy: Secondary | ICD-10-CM | POA: Diagnosis not present

## 2014-07-30 DIAGNOSIS — Z7952 Long term (current) use of systemic steroids: Secondary | ICD-10-CM

## 2014-07-30 DIAGNOSIS — C3402 Malignant neoplasm of left main bronchus: Secondary | ICD-10-CM

## 2014-07-30 DIAGNOSIS — Z923 Personal history of irradiation: Secondary | ICD-10-CM

## 2014-07-30 DIAGNOSIS — I2510 Atherosclerotic heart disease of native coronary artery without angina pectoris: Secondary | ICD-10-CM

## 2014-07-30 DIAGNOSIS — Z951 Presence of aortocoronary bypass graft: Secondary | ICD-10-CM

## 2014-07-30 DIAGNOSIS — Z794 Long term (current) use of insulin: Secondary | ICD-10-CM

## 2014-07-30 DIAGNOSIS — C3412 Malignant neoplasm of upper lobe, left bronchus or lung: Secondary | ICD-10-CM

## 2014-07-30 DIAGNOSIS — J439 Emphysema, unspecified: Secondary | ICD-10-CM

## 2014-07-30 DIAGNOSIS — E119 Type 2 diabetes mellitus without complications: Secondary | ICD-10-CM

## 2014-07-30 LAB — COMPREHENSIVE METABOLIC PANEL
ALT: 26 U/L (ref 17–63)
AST: 29 U/L (ref 15–41)
Albumin: 3.6 g/dL (ref 3.5–5.0)
Alkaline Phosphatase: 93 U/L (ref 38–126)
Anion gap: 10 (ref 5–15)
BUN: 20 mg/dL (ref 6–20)
CO2: 22 mmol/L (ref 22–32)
CREATININE: 1.12 mg/dL (ref 0.61–1.24)
Calcium: 8.9 mg/dL (ref 8.9–10.3)
Chloride: 103 mmol/L (ref 101–111)
GFR calc Af Amer: >60 mL/min (ref >60)
GFR calc non Af Amer: >60 mL/min (ref >60)
GLUCOSE: 218 mg/dL — AB (ref 65–99)
POTASSIUM: 4.6 mmol/L (ref 3.5–5.1)
Sodium: 135 mmol/L (ref 135–145)
TOTAL PROTEIN: 7 g/dL (ref 6.5–8.1)
Total Bilirubin: 0.6 mg/dL (ref 0.3–1.2)

## 2014-07-30 LAB — CBC WITH DIFFERENTIAL/PLATELET
Basophils Absolute: 0 10*3/uL (ref 0–0.1)
Basophils Relative: 0 %
EOS ABS: 0.1 10*3/uL (ref 0–0.7)
Eosinophils Relative: 1 %
HCT: 30.6 % — ABNORMAL LOW (ref 40.0–52.0)
Hemoglobin: 9.9 g/dL — ABNORMAL LOW (ref 13.0–18.0)
Lymphocytes Relative: 6 %
Lymphs Abs: 0.3 10*3/uL — ABNORMAL LOW (ref 1.0–3.6)
MCH: 29.3 pg (ref 26.0–34.0)
MCHC: 32.4 g/dL (ref 32.0–36.0)
MCV: 90.5 fL (ref 80.0–100.0)
MONOS PCT: 14 %
Monocytes Absolute: 0.7 10*3/uL (ref 0.2–1.0)
NEUTROS ABS: 4 10*3/uL (ref 1.4–6.5)
NEUTROS PCT: 79 %
Platelets: 182 10*3/uL (ref 150–440)
RBC: 3.38 MIL/uL — ABNORMAL LOW (ref 4.40–5.90)
RDW: 22.9 % — ABNORMAL HIGH (ref 11.5–14.5)
WBC: 5 10*3/uL (ref 3.8–10.6)

## 2014-07-30 MED ORDER — HEPARIN SOD (PORK) LOCK FLUSH 100 UNIT/ML IV SOLN
500.0000 [IU] | Freq: Once | INTRAVENOUS | Status: AC
  Administered 2014-07-30: 500 [IU] via INTRAVENOUS

## 2014-07-30 MED ORDER — SODIUM CHLORIDE 0.9 % IJ SOLN
10.0000 mL | INTRAMUSCULAR | Status: AC | PRN
  Administered 2014-07-30: 10 mL via INTRAVENOUS
  Filled 2014-07-30: qty 10

## 2014-07-30 MED ORDER — HEPARIN SOD (PORK) LOCK FLUSH 100 UNIT/ML IV SOLN
INTRAVENOUS | Status: AC
  Filled 2014-07-30: qty 5

## 2014-07-30 NOTE — Progress Notes (Signed)
[place] @ [facility] [phone]  Fax:([phone]) [place]: Dec 08, 1940  MR#: [id number]  MVH#:[id number]  Patient Care Team: Albina Billet, MD as PCP - General (Internal Medicine)  CHIEF COMPLAINT:  Chief Complaint  Patient presents with  . Follow-up    Oncology History   74 year old gentleman with left hilar mass and mediastinal adenopathy Chronic smoker quit smoking in 2000 Non-insulin-dependent diabetes History of coronary artery disease with status post bypass surgery done in year 2000 1.  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  Patient is starting carboplatinum and Taxol with AUC of 2 and 45 mg/m respectively concurrent with radiation therapy (May 27, 2014) 3.  He has finished total 6 cycles of carboplatinum and Taxol weekly chemotherapy on July 02, 2014     Cancer of lung    Cancer Diagnosis     Oncology Flowsheet 05/20/2014 05/27/2014 06/03/2014 06/10/2014 06/17/2014 06/24/2014 07/02/2014  Day, Cycle - Day 1, Cycle 1 Day 1, Cycle 2 Day 1, Cycle 3 Day 1, Cycle 1 Day 1, Cycle 2 Day 1, Cycle 3  CARBOplatin (PARAPLATIN) IV - 230 mg 190 mg 210 mg 210 mg 210 mg 190 mg  dexamethasone (DECADRON) IV - [ 12 mg ] [ 12 mg ] [ 12 mg ] [ 12 mg ] [ 20 mg ] [ 20 mg ]  fosaprepitant (EMEND) IV - [ 150 mg ] [ 150 mg ] [ 150 mg ] [ 150 mg ] - -  ondansetron (ZOFRAN) IJ - - - - - - -  ondansetron (ZOFRAN) IV - - - - - [ 16 mg ] [ 16 mg ]  PACLitaxel (TAXOL) IV - 45 mg/m2 45 mg/m2 45 mg/m2 45 mg/m2 45 mg/m2 45 mg/m2  palonosetron (ALOXI) IV - 0.25 mg 0.25 mg 0.25 mg 0.25 mg - -    INTERVAL HISTORY: Patient underwent EBUS and biopsy was positive for squamous cell carcinoma of lung.  Was stages IIIa disease.  Here for further follow-up and treatment consideration.  Had one episode of hemoptysis after bronchoscopy .  Patient was advised to stop taking aspirin.  Hemoptysis has  resolved. Dry hacking cough continues .  June 10, 2014 Patient has started radiation therapy.  Tolerating chemotherapy and radiation therapy very well.  Prednisone is helping with off.  No tingling.  No numbness.  No soreness in the mouth June, 2016 Patient is here to initiate 6 and the last chemotherapy cycle.  Continue radiation therapy which will finish in next 2 weeks.  No nausea no vomiting no diarrhea is no soreness in the mouth dry hacking cough is persistent July 21, 20616 Patient has finished all chemoradiation therapy feeling better.  Cough is improved shortness of breath is improving.  He is here for further follow-up and treatment consideration.  No hemoptysis. REVIEW OF SYSTEMS:   ROS Gen. status: Patient is alert oriented not any acute distress. Head exam was generally normal. There was no scleral icterus or corneal arcus. Mucous membranes were moist.  Lungs: Emphysematous chest occasional rhonchi.  Significant improvement in wheezing.Cardiac exam revealed the PMI to be normally situated and sized. The rhythm was regular and no extrasystoles were noted during several minutes of auscultation. The first and second heart sounds were normal and physiologic splitting of the second heart sound was noted. There were no murmurs, rubs, clicks,  or gallops.Abdominal exam revealed normal bowel sounds. The abdomen was soft, non-tender, and without masses, organomegaly, or appreciable enlargement of the abdominal aorta.Examination of the skin revealed no evidence of significant rashes, suspicious appearing nevi or other concerning lesions..  Neurologically, the patient was awake, alert, and oriented to person, place and time. There were no obvious focal neurologic abnormalities.  Lower extremity no edema. As per HPI. Otherwise, a complete review of systems is negatve.  PAST MEDICAL HISTORY: Past Medical History  Diagnosis Date  . Coronary artery disease   . Pneumonia 2000  . Arthritis   .  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 Bruises easily   . GERD (gastroesophageal reflux disease)     takes Omeprazole daily as needed for stomach pain  . Blood transfusion 2001  . Diabetes mellitus     takes Metformin daily;  . Impaired hearing     bil hearing aide  . Macular degeneration     being watched for this but hasn't been "completely" diagnosed  . Myocardial infarction 2001    PAST SURGICAL HISTORY: Past Surgical History  Procedure Laterality Date  . 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 Cardiac catheterization  2001  . Tonsillectomy      as a child  . Colonoscopy    . Cataract extraction      bilateral  . Lumbar laminectomy/decompression microdiscectomy  12/19/2010    Procedure: LUMBAR LAMINECTOMY/DECOMPRESSION MICRODISCECTOMY;  Surgeon: Elaina Hoops;  Location: [place] [facility];  Service: Neurosurgery;  Laterality: N/A;  Lumbar three-four, four-five decompressive lumbar laminectomy  . Joint replacement  right tkr  . Eye surgery      cataracts  . Back surgery      l4 5  . Portacath placement N/A 05/20/2014    Procedure: INSERTION PORT-A-CATH;  Surgeon: Nestor Lewandowsky, MD;  Location: [facility] ORS;  Service: General;  Laterality: N/A;    FAMILY HISTORY Family History  Problem Relation Age of Onset  . Anesthesia problems Neg Hx   . Hypotension Neg Hx   . Malignant hyperthermia Neg Hx   . Pseudochol deficiency Neg Hx         ADVANCED DIRECTIVES: Patient does have advanced health care directive   HEALTH MAINTENANCE: History  Substance Use Topics  . Smoking status: Former Smoker -- 40 years    Types: Cigarettes    Quit date: 05/19/1999  . Smokeless tobacco: Never Used     Comment: 2001  . Alcohol Use: No     No Known Allergies  Current Outpatient Prescriptions  Medication Sig Dispense Refill  . albuterol (PROVENTIL HFA;VENTOLIN HFA) 108 (90 BASE) MCG/ACT inhaler Inhale 2 puffs into the lungs as needed for wheezing or shortness of  breath.     . Alcohol Swabs (B-D SINGLE USE SWABS REGULAR) PADS     . ANORO ELLIPTA 62.5-25 MCG/INH AEPB   11  . aspirin 81 MG tablet     . benzonatate (TESSALON) 100 MG capsule Take 1 capsule (100 mg total) by mouth 3 (three) times daily as needed for cough. Apply co-pay to [facility] per Ulice Dash. 90 capsule 0  . beta carotene w/minerals (OCUVITE) tablet Take 1 tablet by mouth 2 (two) times daily.    . Blood Glucose Calibration (TRUETEST CONTROL LEVEL 1) LIQD     . CHERATUSSIN AC 100-10 MG/5ML syrup Take 5 mLs by mouth 3 (three) times daily as needed for cough.  Apply co-pay to [facility] per Ulice Dash. 120 mL 0  . dexamethasone (DECADRON) 4 MG tablet Take 2 tablets (8 mg total) by mouth 2 (two) times daily with a meal. Start the day after chemotherapy for 3 days. 30 tablet 1  . Dextromethorphan-Guaifenesin (ROBITUSSIN DM) 10-100 MG/5ML liquid Take 5 mLs by mouth every 12 (twelve) hours. 240 mL 1  . glimepiride (AMARYL) 1 MG tablet     . glucose blood test strip     . HYDROcodone-acetaminophen (NORCO/VICODIN) 5-325 MG per tablet Take by mouth.    [employer] Devices (TRUEDRAW LANCING DEVICE) MISC     . lidocaine-prilocaine (EMLA) cream     . magnesium oxide (MAG-OX) 400 MG tablet Take 400 mg by mouth daily.      . Melatonin 5 MG CAPS Take 1 capsule (5 mg total) by mouth at bedtime as needed.  0  . metFORMIN (GLUCOPHAGE) 500 MG tablet Take 500 mg by mouth daily. Take '1000mg'$  po in morning, take '500mg'$  po at bedtime    . metoprolol (LOPRESSOR) 50 MG tablet 50 mg 2 (two) times daily.    . naproxen (NAPROSYN) 500 MG tablet     . omeprazole (PRILOSEC) 40 MG capsule Take 40 mg by mouth daily as needed. For acid reflux      . ondansetron (ZOFRAN) 8 MG tablet Take 1 tablet (8 mg total) by mouth 2 (two) times daily. Start the day after chemo for 3 days. Then take as needed for nausea or vomiting. 30 tablet 1  . predniSONE (DELTASONE) 20 MG tablet TAKE 1 TABLET (20 MG TOTAL) BY MOUTH DAILY WITH  BREAKFAST. TAKE FOR 15 DAYS. 15 tablet 0  . simvastatin (ZOCOR) 40 MG tablet Take 40 mg by mouth at bedtime.      . sucralfate (CARAFATE) 1 G tablet Take 1 tablet (1 g total) by mouth 4 (four) times daily -  with meals and at bedtime. Dissolve in 3 to 4 tablespoons of warm water, swish and swallow 90 tablet 3  . SYMBICORT 160-4.5 MCG/ACT inhaler   12  . TRUEPLUS LANCETS 28G MISC     . zolpidem (AMBIEN) 10 MG tablet Take 1 tablet (10 mg total) by mouth at bedtime as needed for sleep. 30 tablet 0   No current facility-administered medications for this visit.   Facility-Administered Medications Ordered in Other Visits  Medication Dose Route Frequency Provider Last Rate Last Dose  . lidocaine-prilocaine (EMLA) cream   Topical Once Evlyn Kanner, NP      . sodium chloride 0.9 % injection 10 mL  10 mL Intracatheter PRN Forest Gleason, MD   10 mL at 06/03/14 1106  . sodium chloride 0.9 % injection 10 mL  10 mL Intravenous PRN Forest Gleason, MD   10 mL at 07/30/14 0920    OBJECTIVE: Filed Vitals:   07/30/14 1020  BP: 116/68  Pulse: 80  Temp: 97.7 F (36.5 C)     Body mass index is 29.59 kg/(m^2).    ECOG FS:1 - Symptomatic but completely ambulatory  Physical Exam  Constitutional: No distress.  HENT:  Head: Normocephalic and atraumatic.  Right Ear: External ear normal.  Left Ear: External ear normal.  Nose: Nose normal.  Mouth/Throat: Oropharynx is clear and moist.  Eyes: Conjunctivae and EOM are normal. Pupils are equal, round, and reactive to light.  Neck: Normal range of motion. Neck supple. No JVD present. No tracheal deviation present. No thyromegaly present.  Cardiovascular: Normal rate, normal heart  sounds and intact distal pulses.  Exam reveals no gallop and no friction rub.   No murmur heard. Pulmonary/Chest: No stridor. No respiratory distress. He has no wheezes. He has no rales. He exhibits no tenderness.  Abdominal: He exhibits no distension and no mass. There is no  tenderness. There is no rebound and no guarding.  Musculoskeletal: He exhibits no edema or tenderness.  Neurological: He displays normal reflexes. No cranial nerve deficit. He exhibits normal muscle tone. Coordination normal.  Skin: No rash noted. He is not diaphoretic. No erythema.  Psychiatric: Memory, affect and judgment normal.  Nursing note and vitals reviewed.    LAB RESULTS:     Component Value Date/Time   NA 135 07/30/2014 0910   NA 134* 04/30/2014 0938   K 4.6 07/30/2014 0910   K 5.0 04/30/2014 0938   CL 103 07/30/2014 0910   CL 102 04/30/2014 0938   CO2 22 07/30/2014 0910   CO2 25 04/30/2014 0938   GLUCOSE 218* 07/30/2014 0910   GLUCOSE 225* 04/30/2014 0938   BUN 20 07/30/2014 0910   BUN 25* 04/30/2014 0938   CREATININE 1.12 07/30/2014 0910   CREATININE 1.20 04/30/2014 0938   CALCIUM 8.9 07/30/2014 0910   CALCIUM 9.2 04/30/2014 0938   PROT 7.0 07/30/2014 0910   PROT 7.5 04/30/2014 0938   ALBUMIN 3.6 07/30/2014 0910   ALBUMIN 3.7 04/30/2014 0938   AST 29 07/30/2014 0910   AST 46* 04/30/2014 0938   ALT 26 07/30/2014 0910   ALT 40 04/30/2014 0938   ALKPHOS 93 07/30/2014 0910   ALKPHOS 104 04/30/2014 0938   BILITOT 0.6 07/30/2014 0910   BILITOT 0.7 04/30/2014 0938   GFRNONAA >60 07/30/2014 0910   GFRNONAA 59* 04/30/2014 0938   GFRAA >60 07/30/2014 0910   GFRAA >60 04/30/2014 0938    No results found for: SPEP, UPEP  Lab Results  Component Value Date   WBC 5.0 07/30/2014   NEUTROABS 4.0 07/30/2014   HGB 9.9* 07/30/2014   HCT 30.6* 07/30/2014   MCV 90.5 07/30/2014   PLT 182 07/30/2014      Chemistry      Component Value Date/Time   NA 135 07/30/2014 0910   NA 134* 04/30/2014 0938   K 4.6 07/30/2014 0910   K 5.0 04/30/2014 0938   CL 103 07/30/2014 0910   CL 102 04/30/2014 0938   CO2 22 07/30/2014 0910   CO2 25 04/30/2014 0938   BUN 20 07/30/2014 0910   BUN 25* 04/30/2014 0938   CREATININE 1.12 07/30/2014 0910   CREATININE 1.20 04/30/2014  0938      Component Value Date/Time   CALCIUM 8.9 07/30/2014 0910   CALCIUM 9.2 04/30/2014 0938   ALKPHOS 93 07/30/2014 0910   ALKPHOS 104 04/30/2014 0938   AST 29 07/30/2014 0910   AST 46* 04/30/2014 0938   ALT 26 07/30/2014 0910   ALT 40 04/30/2014 0938   BILITOT 0.6 07/30/2014 0910   BILITOT 0.7 04/30/2014 0938       ASSESSMENT: \ 1.  Carcinoma of lung left upper lobe.  Stage IIIa squamous cell carcinoma.  I Patient has finished total 6 cycles of chemotherapy with carboplatinum and Taxol Repeat PET scan in 6 weeks.  I discussed possibility of consolidation therapy if patient has significant response.  With carboplatinum and Taxol  x2    Cancer of lung   Staging form: Lung, AJCC 7th Edition     Clinical: T2, N2, M0 - Unsigned   [employer]  Javaun Dimperio, MD   07/30/2014 10:33 AM

## 2014-07-30 NOTE — Progress Notes (Signed)
Patient does have living will.  Former smoker.

## 2014-09-03 ENCOUNTER — Ambulatory Visit
Admission: RE | Admit: 2014-09-03 | Discharge: 2014-09-03 | Disposition: A | Discharge status: Home / Self Care | Payer: Medicare HMO | Source: Ambulatory Visit | Attending: Radiation Oncology | Admitting: Radiation Oncology

## 2014-09-03 ENCOUNTER — Ambulatory Visit: Payer: Medicare HMO | Admitting: Radiation Oncology

## 2014-09-03 ENCOUNTER — Inpatient Hospital Stay: Payer: Medicare HMO | Attending: Oncology

## 2014-09-03 ENCOUNTER — Encounter: Payer: Self-pay | Admitting: Radiation Oncology

## 2014-09-03 ENCOUNTER — Inpatient Hospital Stay: Payer: Medicare HMO

## 2014-09-03 VITALS — BP 113/67 | HR 87 | Temp 97.5°F | Wt 228.6 lb

## 2014-09-03 DIAGNOSIS — Z452 Encounter for adjustment and management of vascular access device: Secondary | ICD-10-CM | POA: Diagnosis not present

## 2014-09-03 DIAGNOSIS — C801 Malignant (primary) neoplasm, unspecified: Secondary | ICD-10-CM

## 2014-09-03 DIAGNOSIS — Z9221 Personal history of antineoplastic chemotherapy: Secondary | ICD-10-CM | POA: Insufficient documentation

## 2014-09-03 DIAGNOSIS — Z85118 Personal history of other malignant neoplasm of bronchus and lung: Secondary | ICD-10-CM

## 2014-09-03 DIAGNOSIS — Z923 Personal history of irradiation: Secondary | ICD-10-CM | POA: Insufficient documentation

## 2014-09-03 DIAGNOSIS — C3412 Malignant neoplasm of upper lobe, left bronchus or lung: Secondary | ICD-10-CM | POA: Insufficient documentation

## 2014-09-03 MED ORDER — SODIUM CHLORIDE 0.9 % IJ SOLN
10.0000 mL | INTRAMUSCULAR | Status: DC | PRN
  Administered 2014-09-03: 10 mL via INTRAVENOUS
  Filled 2014-09-03: qty 10

## 2014-09-03 MED ORDER — HEPARIN SOD (PORK) LOCK FLUSH 100 UNIT/ML IV SOLN
INTRAVENOUS | Status: AC
  Filled 2014-09-03: qty 5

## 2014-09-03 MED ORDER — HEPARIN SOD (PORK) LOCK FLUSH 100 UNIT/ML IV SOLN
500.0000 [IU] | Freq: Once | INTRAVENOUS | Status: AC
Start: 2014-09-03 — End: 2014-09-03
  Administered 2014-09-03: 500 [IU] via INTRAVENOUS

## 2014-09-03 NOTE — Progress Notes (Unsigned)
Survivorship visit completed.  SCP and treatment summary reviewed with patient and wife and given to them.  ASCO questions and survivorship booklet reviewed and given to patient.  Resources made available, Cancer Transitions, CARE program and other resources.  Encouraged importance of follow-up care and port flushes every 6 weeks.

## 2014-09-03 NOTE — Progress Notes (Signed)
Radiation Oncology Follow up Note  Name: Ivan Beasley   Date:   09/03/2014 MRN:  [id number] DOB: 1940/11/20    This 74 y.o. male presents to the clinic today for follow-up for lung cancer.  REFERRING PROVIDER: Albina Billet, MD  HPI: Patient is a 74 year old male now one month out having completed radiation therapy for stage IIIa (T2 1 N2 M0) squamous cell carcinoma of the left lung. Seen today in routine follow-up he is doing fairly well still has a productive cough no hemoptysis also mild shortness of breath and dyspnea on exertion. He is scheduled for repeat CT scan in September. He's having no dysphagia. PO intake is good weight is stable..  COMPLICATIONS OF TREATMENT: none  FOLLOW UP COMPLIANCE: keeps appointments   PHYSICAL EXAM:  BP 113/67 mmHg  Pulse 87  Temp(Src) 97.5 F (36.4 C)  Wt 228 lb 9.9 oz (103.7 kg) Well-developed well-nourished patient in NAD. HEENT reveals PERLA, EOMI, discs not visualized.  Oral cavity is clear. No oral mucosal lesions are identified. Neck is clear without evidence of cervical or supraclavicular adenopathy. Lungs are clear to A&P. Cardiac examination is essentially unremarkable with regular rate and rhythm without murmur rub or thrill. Abdomen is benign with no organomegaly or masses noted. Motor sensory and DTR levels are equal and symmetric in the upper and lower extremities. Cranial nerves II through XII are grossly intact. Proprioception is intact. No peripheral adenopathy or edema is identified. No motor or sensory levels are noted. Crude visual fields are within normal range.   RADIOLOGY RESULTS: PET CT scan in September will be reviewed  PLAN: Present time he is doing well recovering nicely from his combined modality treatment he is scheduled for follow-up repeat PET CT scan at September. I am please was overall progress. I've asked to see him back in 3-4 months for follow-up. He continues close follow-up care with medical oncology.  I  would like to take this opportunity for allowing me to participate in the care of your patient.Armstead Peaks., MD

## 2014-09-03 NOTE — Progress Notes (Signed)
Patient states that he emains to have a cough with a gray phlegm looking substance.

## 2014-09-10 ENCOUNTER — Ambulatory Visit
Admission: RE | Admit: 2014-09-10 | Discharge: 2014-09-10 | Disposition: A | Discharge status: Home / Self Care | Payer: Medicare HMO | Source: Ambulatory Visit | Attending: Oncology | Admitting: Oncology

## 2014-09-10 DIAGNOSIS — C3402 Malignant neoplasm of left main bronchus: Secondary | ICD-10-CM | POA: Insufficient documentation

## 2014-09-10 DIAGNOSIS — R590 Localized enlarged lymph nodes: Secondary | ICD-10-CM | POA: Diagnosis not present

## 2014-09-10 LAB — GLUCOSE, CAPILLARY: Glucose-Capillary: 152 mg/dL — ABNORMAL HIGH (ref 65–99)

## 2014-09-10 MED ORDER — TECHNETIUM TC 99M SESTAMIBI - CARDIOLITE: 13.0000 | Freq: Once | INTRAVENOUS | Status: DC | PRN

## 2014-09-10 MED ORDER — FLUDEOXYGLUCOSE F - 18 (FDG) INJECTION
12.5300 | Freq: Once | INTRAVENOUS | Status: DC | PRN
  Administered 2014-09-10: 12.53 via INTRAVENOUS
  Filled 2014-09-10: qty 12.53

## 2014-09-15 ENCOUNTER — Inpatient Hospital Stay: Payer: Medicare HMO | Attending: Oncology | Admitting: Oncology

## 2014-09-15 VITALS — BP 132/74 | HR 76 | Temp 97.1°F | Wt 228.2 lb

## 2014-09-15 DIAGNOSIS — Z7982 Long term (current) use of aspirin: Secondary | ICD-10-CM | POA: Diagnosis not present

## 2014-09-15 DIAGNOSIS — Z923 Personal history of irradiation: Secondary | ICD-10-CM | POA: Insufficient documentation

## 2014-09-15 DIAGNOSIS — I2510 Atherosclerotic heart disease of native coronary artery without angina pectoris: Secondary | ICD-10-CM | POA: Diagnosis not present

## 2014-09-15 DIAGNOSIS — Z7952 Long term (current) use of systemic steroids: Secondary | ICD-10-CM | POA: Diagnosis not present

## 2014-09-15 DIAGNOSIS — I252 Old myocardial infarction: Secondary | ICD-10-CM | POA: Diagnosis not present

## 2014-09-15 DIAGNOSIS — E119 Type 2 diabetes mellitus without complications: Secondary | ICD-10-CM | POA: Diagnosis not present

## 2014-09-15 DIAGNOSIS — J439 Emphysema, unspecified: Secondary | ICD-10-CM | POA: Diagnosis not present

## 2014-09-15 DIAGNOSIS — Z79899 Other long term (current) drug therapy: Secondary | ICD-10-CM | POA: Diagnosis not present

## 2014-09-15 DIAGNOSIS — Z87891 Personal history of nicotine dependence: Secondary | ICD-10-CM | POA: Diagnosis not present

## 2014-09-15 DIAGNOSIS — Z951 Presence of aortocoronary bypass graft: Secondary | ICD-10-CM | POA: Diagnosis not present

## 2014-09-15 DIAGNOSIS — Z9221 Personal history of antineoplastic chemotherapy: Secondary | ICD-10-CM | POA: Diagnosis not present

## 2014-09-15 DIAGNOSIS — B370 Candidal stomatitis: Secondary | ICD-10-CM | POA: Diagnosis not present

## 2014-09-15 DIAGNOSIS — C3412 Malignant neoplasm of upper lobe, left bronchus or lung: Secondary | ICD-10-CM | POA: Diagnosis not present

## 2014-09-15 DIAGNOSIS — C3402 Malignant neoplasm of left main bronchus: Secondary | ICD-10-CM

## 2014-09-15 DIAGNOSIS — Z5111 Encounter for antineoplastic chemotherapy: Secondary | ICD-10-CM | POA: Insufficient documentation

## 2014-09-15 DIAGNOSIS — Z794 Long term (current) use of insulin: Secondary | ICD-10-CM

## 2014-09-15 MED ORDER — DEXAMETHASONE 4 MG PO TABS: 4.0000 mg | ORAL_TABLET | Freq: Two times a day (BID) | ORAL | Status: DC

## 2014-09-15 NOTE — Progress Notes (Signed)
Patient does have living will.  Former smoker.

## 2014-09-16 ENCOUNTER — Encounter: Payer: Self-pay | Admitting: Oncology

## 2014-09-16 NOTE — Progress Notes (Signed)
[place] @ [facility] Telephone:([phone]) [phone]  Fax:([phone]) Talty: [birth year]  MR#: [id number]  VEL#:[id number]  Patient Care Team: Albina Billet, MD as PCP - General (Internal Medicine)  CHIEF COMPLAINT:  Chief Complaint  Patient presents with  . Follow-up   Oncology History   74 year old gentleman with left hilar mass and mediastinal adenopathy Chronic smoker quit smoking in 2000 Non-insulin-dependent diabetes History of coronary artery disease with status post bypass surgery done in year 2000 1.  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  Patient is starting carboplatinum and Taxol with AUC of 2 and 45 mg/m respectively concurrent with radiation therapy (May 27, 2014) 3.  He has finished total 6 cycles of carboplatinum and Taxol weekly chemotherapy on July 02, 2014    4.  Patient had a PET scan done which shows partial response.  (September, 2016 Patient was start consolidation therapy from September 2 016     Oncology Flowsheet 05/20/2014 05/27/2014 06/03/2014 06/10/2014 06/17/2014 06/24/2014 07/02/2014  Day, Cycle - Day 1, Cycle 1 Day 1, Cycle 2 Day 1, Cycle 3 Day 1, Cycle 1 Day 1, Cycle 2 Day 1, Cycle 3  CARBOplatin (PARAPLATIN) IV - 230 mg 190 mg 210 mg 210 mg 210 mg 190 mg  dexamethasone (DECADRON) IV - [ 12 mg ] [ 12 mg ] [ 12 mg ] [ 12 mg ] [ 20 mg ] [ 20 mg ]  fosaprepitant (EMEND) IV - [ 150 mg ] [ 150 mg ] [ 150 mg ] [ 150 mg ] - -  ondansetron (ZOFRAN) IJ - - - - - - -  ondansetron (ZOFRAN) IV - - - - - [ 16 mg ] [ 16 mg ]  PACLitaxel (TAXOL) IV - 45 mg/m2 45 mg/m2 45 mg/m2 45 mg/m2 45 mg/m2 45 mg/m2  palonosetron (ALOXI) IV - 0.25 mg 0.25 mg 0.25 mg 0.25 mg - -    INTERVAL HISTORY: Patient underwent EBUS and biopsy was positive for squamous cell carcinoma of lung.  Was stages IIIa disease.  Here for further follow-up and treatment consideration.  Had one episode of  hemoptysis after bronchoscopy .  Patient was advised to stop taking aspirin.  Hemoptysis has resolved. Dry hacking cough continues .  June 10, 2014 Patient has started radiation therapy.  Tolerating chemotherapy and radiation therapy very well.  Prednisone is helping with off.  No tingling.  No numbness.  No soreness in the mouth June, 2016 Patient is here to initiate 6 and the last chemotherapy cycle.  Continue radiation therapy which will finish in next 2 weeks.  No nausea no vomiting no diarrhea is no soreness in the mouth dry hacking cough is persistent July 21, 20616 Patient has finished all chemoradiation therapy feeling better.  Cough is improved shortness of breath is improving.  He is here for further follow-up and treatment consideration.  No hemoptysis. September 15, 2014  Patient is here for ongoing evaluation and continuation of treatment.  Patient had a PET scan done which has been independently reviewed.  We discussed possibility of 2 more cycles of chemotherapy.  Cough and shortness of breath is improved no hemoptysis no chest pain no difficulty swallowing.  No tingling.  No numbness.  Wife was concerned about Cycle of chemotherapy [place] has heard that 7 cycle of carboplatinum can cause hypersensitivity reaction.  REVIEW OF SYSTEMS:   ROS Gen. status: Patient is alert oriented not any acute distress. Head exam was generally normal. There was no scleral icterus or corneal arcus. Mucous membranes were moist.  Lungs: Emphysematous chest occasional rhonchi.  Significant improvement in wheezing.Cardiac exam revealed the PMI to be normally situated and sized. The rhythm was regular and no extrasystoles were noted during several minutes of auscultation. The first and second heart sounds were normal and physiologic splitting of the second heart sound was noted. There were no murmurs, rubs, clicks, or gallops.Abdominal exam revealed normal bowel sounds. The abdomen was soft, non-tender, and without  masses, organomegaly, or appreciable enlargement of the abdominal aorta.Examination of the skin revealed no evidence of significant rashes, suspicious appearing nevi or other concerning lesions..  Neurologically, the patient was awake, alert, and oriented to person, place and time. There were no obvious focal neurologic abnormalities.  Lower extremity no edema. As per HPI. Otherwise, a complete review of systems is negatve.  PAST MEDICAL HISTORY: Past Medical History  Diagnosis Date  . Coronary artery disease   . Pneumonia 2000  . Arthritis   . 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 Bruises easily   . GERD (gastroesophageal reflux disease)     takes Omeprazole daily as needed for stomach pain  . Blood transfusion 2001  . Diabetes mellitus     takes Metformin daily;  . Impaired hearing     bil hearing aide  . Macular degeneration     being watched for this but hasn't been "completely" diagnosed  . Myocardial infarction 2001    PAST SURGICAL HISTORY: Past Surgical History  Procedure Laterality Date  . 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 Cardiac catheterization  2001  . Tonsillectomy      as a child  . Colonoscopy    . Cataract extraction      bilateral  . Lumbar laminectomy/decompression microdiscectomy  12/19/2010    Procedure: LUMBAR LAMINECTOMY/DECOMPRESSION MICRODISCECTOMY;  Surgeon: Elaina Hoops;  Location: [place];  Service: Neurosurgery;  Laterality: N/A;  Lumbar three-four, four-five decompressive lumbar laminectomy  . Joint replacement  right tkr  . Eye surgery      cataracts  . Back surgery      l4 5  . Portacath placement N/A 05/20/2014    Procedure: INSERTION PORT-A-CATH;  Surgeon: Nestor Lewandowsky, MD;  Location: [facility];  Service: General;  Laterality: N/A;    FAMILY HISTORY Family History  Problem Relation Age of Onset  . Anesthesia problems Neg Hx   . Hypotension Neg Hx   . Malignant hyperthermia Neg Hx   . Pseudochol  deficiency Neg Hx         ADVANCED DIRECTIVES: Patient does have advanced health care directive   HEALTH MAINTENANCE: Social History  Substance Use Topics  . Smoking status: Former Smoker -- 40 years    Types: Cigarettes    Quit date: 05/19/1999  . Smokeless tobacco: Never Used     Comment: 2001  . Alcohol Use: No     No Known Allergies  Current Outpatient Prescriptions  Medication Sig Dispense Refill  . albuterol (PROVENTIL HFA;VENTOLIN HFA) 108 (90 BASE) MCG/ACT inhaler Inhale 2 puffs into the lungs as needed for wheezing or shortness of breath.     . Alcohol Swabs (B-D SINGLE USE SWABS REGULAR) PADS     . ANORO ELLIPTA 62.5-25 MCG/INH AEPB   11  .  aspirin 81 MG tablet     . benzonatate (TESSALON) 100 MG capsule Take 1 capsule (100 mg total) by mouth 3 (three) times daily as needed for cough. Apply co-pay to [facility] per Ulice Dash. 90 capsule 0  . beta carotene w/minerals (OCUVITE) tablet Take 1 tablet by mouth 2 (two) times daily.    . Blood Glucose Calibration (TRUETEST CONTROL LEVEL 1) LIQD     . CHERATUSSIN AC 100-10 MG/5ML syrup Take 5 mLs by mouth 3 (three) times daily as needed for cough. Apply co-pay to [facility] per Ulice Dash. 120 mL 0  . Dextromethorphan-Guaifenesin (ROBITUSSIN DM) 10-100 MG/5ML liquid Take 5 mLs by mouth every 12 (twelve) hours. 240 mL 1  . glimepiride (AMARYL) 1 MG tablet     . glucose blood test strip     . HYDROcodone-acetaminophen (NORCO/VICODIN) 5-325 MG per tablet Take by mouth.    [employer] Devices (TRUEDRAW LANCING DEVICE) MISC     . lidocaine-prilocaine (EMLA) cream     . magnesium oxide (MAG-OX) 400 MG tablet Take 400 mg by mouth daily.      . Melatonin 5 MG CAPS Take 1 capsule (5 mg total) by mouth at bedtime as needed.  0  . metFORMIN (GLUCOPHAGE) 500 MG tablet Take 500 mg by mouth daily. Take '1000mg'$  po in morning, take '500mg'$  po at bedtime    . metoprolol (LOPRESSOR) 50 MG tablet 50 mg 2 (two) times daily.    . naproxen  (NAPROSYN) 500 MG tablet     . omeprazole (PRILOSEC) 40 MG capsule Take 40 mg by mouth daily as needed. For acid reflux      . ondansetron (ZOFRAN) 8 MG tablet Take 1 tablet (8 mg total) by mouth 2 (two) times daily. Start the day after chemo for 3 days. Then take as needed for nausea or vomiting. 30 tablet 1  . predniSONE (DELTASONE) 20 MG tablet TAKE 1 TABLET (20 MG TOTAL) BY MOUTH DAILY WITH BREAKFAST. TAKE FOR 15 DAYS. 15 tablet 0  . simvastatin (ZOCOR) 40 MG tablet Take 40 mg by mouth at bedtime.      . sucralfate (CARAFATE) 1 G tablet Take 1 tablet (1 g total) by mouth 4 (four) times daily -  with meals and at bedtime. Dissolve in 3 to 4 tablespoons of warm water, swish and swallow 90 tablet 3  . SYMBICORT 160-4.5 MCG/ACT inhaler   12  . TRUEPLUS LANCETS 28G MISC     . dexamethasone (DECADRON) 4 MG tablet Take 1 tablet (4 mg total) by mouth 2 (two) times daily. Take day before [place] chemotherapy. 10 tablet 0  . zolpidem (AMBIEN) 10 MG tablet Take 1 tablet (10 mg total) by mouth at bedtime as needed for sleep. 30 tablet 0   No current facility-administered medications for this visit.   Facility-Administered Medications Ordered in Other Visits  Medication Dose Route Frequency Provider Last Rate Last Dose  . lidocaine-prilocaine (EMLA) cream   Topical Once Evlyn Kanner, NP      . sodium chloride 0.9 % injection 10 mL  10 mL Intracatheter PRN Forest Gleason, MD   10 mL at 06/03/14 1106  . sodium chloride 0.9 % injection 10 mL  10 mL Intravenous PRN Forest Gleason, MD   10 mL at 07/30/14 0920    OBJECTIVE: Filed Vitals:   09/15/14 1139  BP: 132/74  Pulse: 76  Temp: 97.1 F (36.2 C)     Body mass index is 30.12 kg/(m^2).  ECOG FS:1 - Symptomatic but completely ambulatory  Physical Exam  Constitutional: No distress.  HENT:  Head: Normocephalic and atraumatic.  Right Ear: External ear normal.  Left Ear: External ear normal.  Nose: Nose normal.  Mouth/Throat: Oropharynx is clear  and moist.  Eyes: Conjunctivae and EOM are normal. Pupils are equal, round, and reactive to light.  Neck: Normal range of motion. Neck supple. No JVD present. No tracheal deviation present. No thyromegaly present.  Cardiovascular: Normal rate, normal heart sounds and intact distal pulses.  Exam reveals no gallop and no friction rub.   No murmur heard. Pulmonary/Chest: No stridor. No respiratory distress. He has no wheezes. He has no rales. He exhibits no tenderness.  Abdominal: He exhibits no distension and no mass. There is no tenderness. There is no rebound and no guarding.  Musculoskeletal: He exhibits no edema or tenderness.  Neurological: He displays normal reflexes. No cranial nerve deficit. He exhibits normal muscle tone. Coordination normal.  Skin: No rash noted. He is not diaphoretic. No erythema.  Psychiatric: Memory, affect and judgment normal.  Nursing note and vitals reviewed.    LAB RESULTS: Lab data is pending     Chemistry         ASSESSMENT: \ 1.  Carcinoma of lung left upper lobe.  Stage IIIa squamous cell carcinoma.  I Patient has finished total 6 cycles of chemotherapy with carboplatinum and Taxol PET scan has been reviewed shows partial response.  PET scan has been reviewed independently and reviewed with the patient.  Persistent mediastinal adenopathy. I had prolonged discussion with patient regarding options which include observation versus  Consolidation chemotherapy.  I discussed possibility of consolidation therapy if patient has significant response.  With carboplatinum and Taxol  x2   Sent in family is somewhat concerned about 7 cycles of carboplatinum.  We discussed possibility of higher chance of hypersensitive reaction Patient was start Decadron 4 mg twice a day day before each chemotherapy carboplatinum and Taxol day 1 and Taxol day 8 and repeated every 3 weeks for total 2 cycles have been planned., Patient was explained all the side effects of  chemotherapy.  And informed consent has been obtained Intent of chemotherapy is palliation and relief in symptoms and extending survival Cancer of lung   Staging form: Lung, AJCC 7th Edition     Clinical: T2, N2, M0 - Ivan Griffon, MD   09/16/2014 8:15 AM

## 2014-09-18 ENCOUNTER — Other Ambulatory Visit: Payer: Self-pay | Admitting: *Deleted

## 2014-09-18 DIAGNOSIS — C3402 Malignant neoplasm of left main bronchus: Secondary | ICD-10-CM

## 2014-09-19 ENCOUNTER — Other Ambulatory Visit: Payer: Self-pay | Admitting: Oncology

## 2014-09-23 ENCOUNTER — Encounter: Payer: Self-pay | Admitting: Oncology

## 2014-09-23 ENCOUNTER — Inpatient Hospital Stay (HOSPITAL_BASED_OUTPATIENT_CLINIC_OR_DEPARTMENT_OTHER): Payer: Medicare HMO | Admitting: Oncology

## 2014-09-23 ENCOUNTER — Inpatient Hospital Stay: Payer: Medicare HMO

## 2014-09-23 VITALS — BP 124/64 | HR 70

## 2014-09-23 VITALS — BP 131/80 | HR 78 | Temp 97.4°F | Wt 226.4 lb

## 2014-09-23 DIAGNOSIS — C3412 Malignant neoplasm of upper lobe, left bronchus or lung: Secondary | ICD-10-CM | POA: Diagnosis not present

## 2014-09-23 DIAGNOSIS — Z5111 Encounter for antineoplastic chemotherapy: Secondary | ICD-10-CM | POA: Diagnosis not present

## 2014-09-23 DIAGNOSIS — Z794 Long term (current) use of insulin: Secondary | ICD-10-CM | POA: Diagnosis not present

## 2014-09-23 DIAGNOSIS — Z9221 Personal history of antineoplastic chemotherapy: Secondary | ICD-10-CM

## 2014-09-23 DIAGNOSIS — I252 Old myocardial infarction: Secondary | ICD-10-CM

## 2014-09-23 DIAGNOSIS — C3401 Malignant neoplasm of right main bronchus: Secondary | ICD-10-CM

## 2014-09-23 DIAGNOSIS — B370 Candidal stomatitis: Secondary | ICD-10-CM

## 2014-09-23 DIAGNOSIS — I2510 Atherosclerotic heart disease of native coronary artery without angina pectoris: Secondary | ICD-10-CM

## 2014-09-23 DIAGNOSIS — Z951 Presence of aortocoronary bypass graft: Secondary | ICD-10-CM

## 2014-09-23 DIAGNOSIS — C3402 Malignant neoplasm of left main bronchus: Secondary | ICD-10-CM

## 2014-09-23 DIAGNOSIS — Z923 Personal history of irradiation: Secondary | ICD-10-CM

## 2014-09-23 DIAGNOSIS — Z7982 Long term (current) use of aspirin: Secondary | ICD-10-CM

## 2014-09-23 DIAGNOSIS — E119 Type 2 diabetes mellitus without complications: Secondary | ICD-10-CM

## 2014-09-23 DIAGNOSIS — Z7952 Long term (current) use of systemic steroids: Secondary | ICD-10-CM

## 2014-09-23 DIAGNOSIS — Z87891 Personal history of nicotine dependence: Secondary | ICD-10-CM

## 2014-09-23 LAB — COMPREHENSIVE METABOLIC PANEL
ALBUMIN: 4.1 g/dL (ref 3.5–5.0)
ALT: 24 U/L (ref 17–63)
ANION GAP: 12 (ref 5–15)
AST: 49 U/L — ABNORMAL HIGH (ref 15–41)
Alkaline Phosphatase: 90 U/L (ref 38–126)
BUN: 25 mg/dL — ABNORMAL HIGH (ref 6–20)
CHLORIDE: 102 mmol/L (ref 101–111)
CO2: 22 mmol/L (ref 22–32)
Calcium: 9.6 mg/dL (ref 8.9–10.3)
Creatinine, Ser: 1.31 mg/dL — ABNORMAL HIGH (ref 0.61–1.24)
GFR calc Af Amer: >60 mL/min (ref >60)
GFR calc non Af Amer: 52 mL/min — ABNORMAL LOW (ref >60)
GLUCOSE: 354 mg/dL — AB (ref 65–99)
POTASSIUM: 5.3 mmol/L — AB (ref 3.5–5.1)
SODIUM: 136 mmol/L (ref 135–145)
TOTAL PROTEIN: 8.1 g/dL (ref 6.5–8.1)
Total Bilirubin: 0.8 mg/dL (ref 0.3–1.2)

## 2014-09-23 LAB — CBC WITH DIFFERENTIAL/PLATELET
BASOS ABS: 0 10*3/uL (ref 0–0.1)
BASOS PCT: 0 %
EOS ABS: 0 10*3/uL (ref 0–0.7)
Eosinophils Relative: 0 %
HCT: 35.2 % — ABNORMAL LOW (ref 40.0–52.0)
Hemoglobin: 11.4 g/dL — ABNORMAL LOW (ref 13.0–18.0)
Lymphocytes Relative: 7 %
Lymphs Abs: 0.5 10*3/uL — ABNORMAL LOW (ref 1.0–3.6)
MCH: 30.4 pg (ref 26.0–34.0)
MCHC: 32.4 g/dL (ref 32.0–36.0)
MCV: 93.8 fL (ref 80.0–100.0)
MONO ABS: 0.1 10*3/uL — AB (ref 0.2–1.0)
MONOS PCT: 2 %
NEUTROS PCT: 91 %
Neutro Abs: 6.9 10*3/uL — ABNORMAL HIGH (ref 1.4–6.5)
PLATELETS: 189 10*3/uL (ref 150–440)
RBC: 3.75 MIL/uL — ABNORMAL LOW (ref 4.40–5.90)
RDW: 16.3 % — AB (ref 11.5–14.5)
WBC: 7.6 10*3/uL (ref 3.8–10.6)

## 2014-09-23 LAB — MAGNESIUM: Magnesium: 1.5 mg/dL — ABNORMAL LOW (ref 1.7–2.4)

## 2014-09-23 MED ORDER — SODIUM CHLORIDE 0.9 % IV SOLN
490.0000 mg | Freq: Once | INTRAVENOUS | Status: AC
  Administered 2014-09-23: 490 mg via INTRAVENOUS
  Filled 2014-09-23: qty 49

## 2014-09-23 MED ORDER — FAMOTIDINE IN NACL 20-0.9 MG/50ML-% IV SOLN
20.0000 mg | Freq: Once | INTRAVENOUS | Status: AC
  Administered 2014-09-23: 20 mg via INTRAVENOUS
  Filled 2014-09-23: qty 50

## 2014-09-23 MED ORDER — PALONOSETRON HCL INJECTION 0.25 MG/5ML
0.2500 mg | Freq: Once | INTRAVENOUS | Status: AC
  Administered 2014-09-23: 0.25 mg via INTRAVENOUS
  Filled 2014-09-23: qty 5

## 2014-09-23 MED ORDER — SODIUM CHLORIDE 0.9 % IV SOLN
Freq: Once | INTRAVENOUS | Status: AC
  Administered 2014-09-23: 10:00:00 via INTRAVENOUS
  Filled 2014-09-23: qty 1000

## 2014-09-23 MED ORDER — FOSAPREPITANT DIMEGLUMINE INJECTION 150 MG
Freq: Once | INTRAVENOUS | Status: AC
  Administered 2014-09-23: 11:00:00 via INTRAVENOUS
  Filled 2014-09-23: qty 5

## 2014-09-23 MED ORDER — PACLITAXEL CHEMO INJECTION 300 MG/50ML
90.0000 mg/m2 | Freq: Once | INTRAVENOUS | Status: AC
  Administered 2014-09-23: 210 mg via INTRAVENOUS
  Filled 2014-09-23: qty 35

## 2014-09-23 MED ORDER — FLUCONAZOLE 100 MG PO TABS: 100.0000 mg | ORAL_TABLET | Freq: Every day | ORAL | Status: DC

## 2014-09-23 MED ORDER — HEPARIN SOD (PORK) LOCK FLUSH 100 UNIT/ML IV SOLN
500.0000 [IU] | Freq: Once | INTRAVENOUS | Status: AC | PRN
  Administered 2014-09-23: 500 [IU]
  Filled 2014-09-23: qty 5

## 2014-09-23 MED ORDER — DIPHENHYDRAMINE HCL 50 MG/ML IJ SOLN
50.0000 mg | Freq: Once | INTRAMUSCULAR | Status: AC
  Administered 2014-09-23: 50 mg via INTRAVENOUS
  Filled 2014-09-23: qty 1

## 2014-09-23 NOTE — Progress Notes (Signed)
[place] @ [facility] Telephone:(336) [phone]  Fax:(336) [phone]     Ivan Beasley OB: 02-Jan-1941  MR#: [id number]  DTO#:[id number]  Patient Care Team: Albina Billet, MD as PCP - General (Internal Medicine)  CHIEF COMPLAINT:  No chief complaint on file.  Oncology History   74 year old gentleman with left hilar mass and mediastinal adenopathy Chronic smoker quit smoking in 2000 Non-insulin-dependent diabetes History of coronary artery disease with status post bypass surgery done in year 2000 1.  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  Patient is starting carboplatinum and Taxol with AUC of 2 and 45 mg/m respectively concurrent with radiation therapy (May 27, 2014) 3.  He has finished total 6 cycles of carboplatinum and Taxol weekly chemotherapy on July 02, 2014    4.  Patient had a PET scan done which shows partial response.  (September, 2016 Patient was start consolidation therapy from September 2 016     Oncology Flowsheet 05/20/2014 05/27/2014 06/03/2014 06/10/2014 06/17/2014 06/24/2014 07/02/2014  Day, Cycle - Day 1, Cycle 1 Day 1, Cycle 2 Day 1, Cycle 3 Day 1, Cycle 1 Day 1, Cycle 2 Day 1, Cycle 3  CARBOplatin (PARAPLATIN) IV - 230 mg 190 mg 210 mg 210 mg 210 mg 190 mg  dexamethasone (DECADRON) IV - [ 12 mg ] [ 12 mg ] [ 12 mg ] [ 12 mg ] [ 20 mg ] [ 20 mg ]  fosaprepitant (EMEND) IV - [ 150 mg ] [ 150 mg ] [ 150 mg ] [ 150 mg ] - -  ondansetron (ZOFRAN) IJ - - - - - - -  ondansetron (ZOFRAN) IV - - - - - [ 16 mg ] [ 16 mg ]  PACLitaxel (TAXOL) IV - 45 mg/m2 45 mg/m2 45 mg/m2 45 mg/m2 45 mg/m2 45 mg/m2  palonosetron (ALOXI) IV - 0.25 mg 0.25 mg 0.25 mg 0.25 mg - -    INTERVAL HISTORY: Patient underwent EBUS and biopsy was positive for squamous cell carcinoma of lung.  Was stages IIIa disease.  Here for further follow-up and treatment consideration.  Had one episode of hemoptysis after  bronchoscopy .  Patient was advised to stop taking aspirin.  Hemoptysis has resolved. Dry hacking cough continues .  June 10, 2014 Patient has started radiation therapy.  Tolerating chemotherapy and radiation therapy very well.  Prednisone is helping with off.  No tingling.  No numbness.  No soreness in the mouth June, 2016 Patient is here to initiate 6 and the last chemotherapy cycle.  Continue radiation therapy which will finish in next 2 weeks.  No nausea no vomiting no diarrhea is no soreness in the mouth dry hacking cough is persistent July 21, 20616 Patient has finished all chemoradiation therapy feeling better.  Cough is improved shortness of breath is improving.  He is here for further follow-up and treatment consideration.  No hemoptysis. September 15, 2014  Patient is here for ongoing evaluation and continuation of treatment.  Patient had a PET scan done which has been independently reviewed.  We discussed possibility of 2 more cycles of chemotherapy.  Cough and shortness of breath is improved no hemoptysis no chest pain no difficulty swallowing.  No tingling.  No numbness.  Wife was concerned about Cycle of chemotherapy [place] has heard that 7 cycle of carboplatinum can cause hypersensitivity reaction..  September 23, 2014 Patient  is supposed to start the chemotherapy today however complains of soreness in the mouth has candidiasis. No cough or shortness of breath appetite has been stable REVIEW OF SYSTEMS:   ROS Gen. status: Patient is alert oriented not any acute distress. Head exam was generally normal. There was no scleral icterus or corneal arcus. Mucous membranes were moist.  Lungs: Emphysematous chest occasional rhonchi.  Significant improvement in wheezing.Cardiac exam revealed the PMI to be normally situated and sized. The rhythm was regular and no extrasystoles were noted during several minutes of auscultation. The first and second heart sounds were normal and physiologic  splitting of the second heart sound was noted. There were no murmurs, rubs, clicks, or gallops.Abdominal exam revealed normal bowel sounds. The abdomen was soft, non-tender, and without masses, organomegaly, or appreciable enlargement of the abdominal aorta.Examination of the skin revealed no evidence of significant rashes, suspicious appearing nevi or other concerning lesions..  Neurologically, the patient was awake, alert, and oriented to person, place and time. There were no obvious focal neurologic abnormalities.  Lower extremity no edema. As per HPI. Otherwise, a complete review of systems is negatve.  PAST MEDICAL HISTORY: Past Medical History  Diagnosis Date  . Coronary artery disease   . Pneumonia 2000  . Arthritis   . 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 Bruises easily   . GERD (gastroesophageal reflux disease)     takes Omeprazole daily as needed for stomach pain  . Blood transfusion 2001  . Diabetes mellitus     takes Metformin daily;  . Impaired hearing     bil hearing aide  . Macular degeneration     being watched for this but hasn't been "completely" diagnosed  . Myocardial infarction 2001    PAST SURGICAL HISTORY: Past Surgical History  Procedure Laterality Date  . 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 Cardiac catheterization  2001  . Tonsillectomy      as a child  . Colonoscopy    . Cataract extraction      bilateral  . Lumbar laminectomy/decompression microdiscectomy  12/19/2010    Procedure: LUMBAR LAMINECTOMY/DECOMPRESSION MICRODISCECTOMY;  Surgeon: Elaina Hoops;  Location: [place] [facility];  Service: Neurosurgery;  Laterality: N/A;  Lumbar three-four, four-five decompressive lumbar laminectomy  . Joint replacement  right tkr  . Eye surgery      cataracts  . Back surgery      l4 5  . Portacath placement N/A 05/20/2014    Procedure: INSERTION PORT-A-CATH;  Surgeon: Nestor Lewandowsky, MD;  Location: [facility] ORS;  Service: General;  Laterality:  N/A;    FAMILY HISTORY Family History  Problem Relation Age of Onset  . Anesthesia problems Neg Hx   . Hypotension Neg Hx   . Malignant hyperthermia Neg Hx   . Pseudochol deficiency Neg Hx         ADVANCED DIRECTIVES: Patient does have advanced health care directive   HEALTH MAINTENANCE: Social History  Substance Use Topics  . Smoking status: Former Smoker -- 40 years    Types: Cigarettes    Quit date: 05/19/1999  . Smokeless tobacco: Never Used     Comment: 2001  . Alcohol Use: No     No Known Allergies  Current Outpatient Prescriptions  Medication Sig Dispense Refill  . albuterol (PROVENTIL HFA;VENTOLIN HFA) 108 (90 BASE) MCG/ACT inhaler Inhale 2 puffs into the lungs as needed for wheezing or shortness of breath.     Marland Kitchen  Alcohol Swabs (B-D SINGLE USE SWABS REGULAR) PADS     . ANORO ELLIPTA 62.5-25 MCG/INH AEPB   11  . benzonatate (TESSALON) 100 MG capsule Take 1 capsule (100 mg total) by mouth 3 (three) times daily as needed for cough. Apply co-pay to [facility] per Ulice Dash. 90 capsule 0  . beta carotene w/minerals (OCUVITE) tablet Take 1 tablet by mouth 2 (two) times daily.    . Blood Glucose Calibration (TRUETEST CONTROL LEVEL 1) LIQD     . CHERATUSSIN AC 100-10 MG/5ML syrup Take 5 mLs by mouth 3 (three) times daily as needed for cough. Apply co-pay to [facility] per Ulice Dash. 120 mL 0  . [facility] TUSSIN DM 100-10 MG/5ML liquid TAKE 1 TEASPOONFUL (5 MLS) BY MOUTH EVERY 12 (TWELVE) HOURS. 237 mL 1  . dexamethasone (DECADRON) 4 MG tablet Take 1 tablet (4 mg total) by mouth 2 (two) times daily. Take day before [place] chemotherapy. 10 tablet 0  . glimepiride (AMARYL) 1 MG tablet     . glucose blood test strip     . HYDROcodone-acetaminophen (NORCO/VICODIN) 5-325 MG per tablet Take by mouth.    Elmore Guise Devices (TRUEDRAW LANCING DEVICE) MISC     . lidocaine-prilocaine (EMLA) cream     . magnesium oxide (MAG-OX) 400 MG tablet Take 400 mg by mouth daily.      . Melatonin 5  MG CAPS Take 1 capsule (5 mg total) by mouth at bedtime as needed.  0  . metFORMIN (GLUCOPHAGE) 500 MG tablet Take 500 mg by mouth daily. Take '1000mg'$  po in morning, take '500mg'$  po at bedtime    . metoprolol (LOPRESSOR) 50 MG tablet 50 mg 2 (two) times daily.    Marland Kitchen omeprazole (PRILOSEC) 40 MG capsule Take 40 mg by mouth daily as needed. For acid reflux      . ondansetron (ZOFRAN) 8 MG tablet Take 1 tablet (8 mg total) by mouth 2 (two) times daily. Start the day after chemo for 3 days. Then take as needed for nausea or vomiting. 30 tablet 1  . predniSONE (DELTASONE) 20 MG tablet TAKE 1 TABLET (20 MG TOTAL) BY MOUTH DAILY WITH BREAKFAST. TAKE FOR 15 DAYS. 15 tablet 0  . simvastatin (ZOCOR) 40 MG tablet Take 40 mg by mouth at bedtime.      . sucralfate (CARAFATE) 1 G tablet Take 1 tablet (1 g total) by mouth 4 (four) times daily -  with meals and at bedtime. Dissolve in 3 to 4 tablespoons of warm water, swish and swallow 90 tablet 3  . SYMBICORT 160-4.5 MCG/ACT inhaler   12  . TRUEPLUS LANCETS 28G MISC     . aspirin 81 MG tablet     . fluconazole (DIFLUCAN) 100 MG tablet Take 1 tablet (100 mg total) by mouth daily. 5 tablet 0  . naproxen (NAPROSYN) 500 MG tablet     . zolpidem (AMBIEN) 10 MG tablet      No current facility-administered medications for this visit.   Facility-Administered Medications Ordered in Other Visits  Medication Dose Route Frequency Provider Last Rate Last Dose  . lidocaine-prilocaine (EMLA) cream   Topical Once Evlyn Kanner, NP      . sodium chloride 0.9 % injection 10 mL  10 mL Intracatheter PRN Forest Gleason, MD   10 mL at 06/03/14 1106  . sodium chloride 0.9 % injection 10 mL  10 mL Intravenous PRN Forest Gleason, MD   10 mL at 07/30/14 0920  OBJECTIVE: Filed Vitals:   09/23/14 0928  BP: 131/80  Pulse: 78  Temp: 97.4 F (36.3 C)     Body mass index is 29.88 kg/(m^2).    ECOG FS:1 - Symptomatic but completely ambulatory  Physical Exam  Constitutional: No  distress.  HENT:  Head: Normocephalic and atraumatic.  Right Ear: External ear normal.  Left Ear: External ear normal.  Nose: Nose normal.  Mouth/Throat: Oropharynx is clear and moist.  Eyes: Conjunctivae and EOM are normal. Pupils are equal, round, and reactive to light.  Neck: Normal range of motion. Neck supple. No JVD present. No tracheal deviation present. No thyromegaly present.  Cardiovascular: Normal rate, normal heart sounds and intact distal pulses.  Exam reveals no gallop and no friction rub.   No murmur heard. Pulmonary/Chest: No stridor. No respiratory distress. He has no wheezes. He has no rales. He exhibits no tenderness.  Abdominal: He exhibits no distension and no mass. There is no tenderness. There is no rebound and no guarding.  Musculoskeletal: He exhibits no edema or tenderness.  Neurological: He displays normal reflexes. No cranial nerve deficit. He exhibits normal muscle tone. Coordination normal.  Skin: No rash noted. He is not diaphoretic. No erythema.  Psychiatric: Memory, affect and judgment normal.  Nursing note and vitals reviewed.    LAB RESULTS: Lab data has been reviewed     Chemistry         ASSESSMENT: \ 1.  Carcinoma of lung left upper lobe.  Stage IIIa squamous cell carcinoma.  I Patient has finished total 6 cycles of chemotherapy with carboplatinum and Taxol PET scan has been reviewed shows partial response.  PET scan has been reviewed independently and reviewed with the patient.  Persistent mediastinal adenopathy. I had prolonged discussion with patient regarding options which include observation versus  Consolidation chemotherapy.  I discussed possibility of consolidation therapy if patient has significant response.  With carboplatinum and Taxol  x2  2.  September 23, 2014 Oral candidiasis Most likely secondary to use of inhaler.  Patient was advised to gargle after each inhaler use. Diflucan 100 mg daily will be given for 5  days. Proceed with consOLIDATION  chemotherapy Sent in family is somewhat concerned about 7 cycles of carboplatinum.  We discussed possibility of higher chance of hypersensitive reaction Patient was start Decadron 4 mg twice a day day before each chemotherapy carboplatinum and Taxol day 1 and Taxol day 8 and repeated every 3 weeks for total 2 cycles have been planned., Patient was explained all the side effects of chemotherapy.  And informed consent has been obtained Intent of chemotherapy is palliation and relief in symptoms and extending survival Cancer of lung   Staging form: Lung, AJCC 7th Edition     Clinical: T2, N2, M0 - Marni Griffon, MD   09/23/2014 9:57 AM

## 2014-09-23 NOTE — Progress Notes (Signed)
Patient has white coating on tounge

## 2014-09-30 ENCOUNTER — Inpatient Hospital Stay: Payer: Medicare HMO

## 2014-09-30 ENCOUNTER — Telehealth: Payer: Self-pay | Admitting: Pharmacist

## 2014-09-30 VITALS — BP 121/60 | HR 71 | Temp 96.9°F

## 2014-09-30 DIAGNOSIS — C3401 Malignant neoplasm of right main bronchus: Secondary | ICD-10-CM

## 2014-09-30 DIAGNOSIS — C3402 Malignant neoplasm of left main bronchus: Secondary | ICD-10-CM

## 2014-09-30 DIAGNOSIS — Z5111 Encounter for antineoplastic chemotherapy: Secondary | ICD-10-CM | POA: Diagnosis not present

## 2014-09-30 LAB — CBC WITH DIFFERENTIAL/PLATELET
BASOS ABS: 0 10*3/uL (ref 0–0.1)
Basophils Relative: 0 %
EOS PCT: 0 %
Eosinophils Absolute: 0 10*3/uL (ref 0–0.7)
HEMATOCRIT: 31.3 % — AB (ref 40.0–52.0)
HEMOGLOBIN: 10.4 g/dL — AB (ref 13.0–18.0)
LYMPHS ABS: 0.3 10*3/uL — AB (ref 1.0–3.6)
LYMPHS PCT: 6 %
MCH: 31.2 pg (ref 26.0–34.0)
MCHC: 33.4 g/dL (ref 32.0–36.0)
MCV: 93.4 fL (ref 80.0–100.0)
Monocytes Absolute: 0.1 10*3/uL — ABNORMAL LOW (ref 0.2–1.0)
Monocytes Relative: 3 %
NEUTROS ABS: 4.7 10*3/uL (ref 1.4–6.5)
NEUTROS PCT: 91 %
PLATELETS: 195 10*3/uL (ref 150–440)
RBC: 3.35 MIL/uL — AB (ref 4.40–5.90)
RDW: 15.3 % — ABNORMAL HIGH (ref 11.5–14.5)
WBC: 5.1 10*3/uL (ref 3.8–10.6)

## 2014-09-30 MED ORDER — PACLITAXEL CHEMO INJECTION 300 MG/50ML
90.0000 mg/m2 | Freq: Once | INTRAVENOUS | Status: AC
  Administered 2014-09-30: 210 mg via INTRAVENOUS
  Filled 2014-09-30: qty 35

## 2014-09-30 MED ORDER — HEPARIN SOD (PORK) LOCK FLUSH 100 UNIT/ML IV SOLN
500.0000 [IU] | Freq: Once | INTRAVENOUS | Status: AC | PRN
  Administered 2014-09-30: 500 [IU]
  Filled 2014-09-30: qty 5

## 2014-09-30 MED ORDER — SODIUM CHLORIDE 0.9 % IJ SOLN
10.0000 mL | Freq: Once | INTRAMUSCULAR | Status: AC
  Administered 2014-09-30: 10 mL via INTRAVENOUS
  Filled 2014-09-30: qty 10

## 2014-09-30 MED ORDER — SODIUM CHLORIDE 0.9 % IV SOLN
Freq: Once | INTRAVENOUS | Status: AC
  Administered 2014-09-30: 09:00:00 via INTRAVENOUS
  Filled 2014-09-30: qty 1000

## 2014-09-30 MED ORDER — DIPHENHYDRAMINE HCL 50 MG/ML IJ SOLN
50.0000 mg | Freq: Once | INTRAMUSCULAR | Status: AC
  Administered 2014-09-30: 50 mg via INTRAVENOUS
  Filled 2014-09-30: qty 1

## 2014-09-30 MED ORDER — FAMOTIDINE IN NACL 20-0.9 MG/50ML-% IV SOLN
20.0000 mg | Freq: Once | INTRAVENOUS | Status: AC
Start: 2014-09-30 — End: 2014-09-30
  Administered 2014-09-30: 20 mg via INTRAVENOUS
  Filled 2014-09-30: qty 50

## 2014-09-30 MED ORDER — SODIUM CHLORIDE 0.9 % IV SOLN
Freq: Once | INTRAVENOUS | Status: AC
  Administered 2014-09-30: 10:00:00 via INTRAVENOUS
  Filled 2014-09-30: qty 4

## 2014-09-30 NOTE — Telephone Encounter (Signed)
Care plan indicates paclitaxel should infuse over 3 hours. Since this is weekly it should infuse over 1 hour. I was unable to change infusion time. RN informed to give over 1 hour.

## 2014-10-05 ENCOUNTER — Telehealth: Payer: Self-pay | Admitting: *Deleted

## 2014-10-05 MED ORDER — DIPHENOXYLATE-ATROPINE 2.5-0.025 MG PO TABS: 1.0000 | ORAL_TABLET | Freq: Four times a day (QID) | ORAL | Status: DC | PRN

## 2014-10-05 NOTE — Telephone Encounter (Signed)
Imodium, Lomotil if Imodium does not work. Per wife, she has been giving him Imodium AD so Lomotil has been called in to [facility]

## 2014-10-05 NOTE — Telephone Encounter (Signed)
Having severe diarrhea and needs something for it

## 2014-10-07 ENCOUNTER — Inpatient Hospital Stay: Payer: Medicare HMO

## 2014-10-07 DIAGNOSIS — Z5111 Encounter for antineoplastic chemotherapy: Secondary | ICD-10-CM | POA: Diagnosis not present

## 2014-10-07 DIAGNOSIS — C3402 Malignant neoplasm of left main bronchus: Secondary | ICD-10-CM

## 2014-10-07 LAB — CBC WITH DIFFERENTIAL/PLATELET
BASOS ABS: 0 10*3/uL (ref 0–0.1)
Basophils Relative: 1 %
EOS ABS: 0.1 10*3/uL (ref 0–0.7)
EOS PCT: 3 %
HCT: 34.3 % — ABNORMAL LOW (ref 40.0–52.0)
Hemoglobin: 11.4 g/dL — ABNORMAL LOW (ref 13.0–18.0)
LYMPHS ABS: 0.4 10*3/uL — AB (ref 1.0–3.6)
LYMPHS PCT: 19 %
MCH: 30.5 pg (ref 26.0–34.0)
MCHC: 33.3 g/dL (ref 32.0–36.0)
MCV: 91.6 fL (ref 80.0–100.0)
MONO ABS: 0.3 10*3/uL (ref 0.2–1.0)
Monocytes Relative: 15 %
Neutro Abs: 1.3 10*3/uL — ABNORMAL LOW (ref 1.4–6.5)
Neutrophils Relative %: 62 %
PLATELETS: 170 10*3/uL (ref 150–440)
RBC: 3.75 MIL/uL — AB (ref 4.40–5.90)
RDW: 15.6 % — AB (ref 11.5–14.5)
WBC: 2 10*3/uL — AB (ref 3.8–10.6)

## 2014-10-14 ENCOUNTER — Inpatient Hospital Stay: Payer: Medicare HMO

## 2014-10-14 ENCOUNTER — Inpatient Hospital Stay: Payer: Medicare HMO | Attending: Oncology

## 2014-10-14 ENCOUNTER — Encounter: Payer: Self-pay | Admitting: Oncology

## 2014-10-14 ENCOUNTER — Inpatient Hospital Stay (HOSPITAL_BASED_OUTPATIENT_CLINIC_OR_DEPARTMENT_OTHER): Payer: Medicare HMO | Admitting: Oncology

## 2014-10-14 VITALS — BP 132/81 | HR 81 | Temp 96.0°F | Wt 221.6 lb

## 2014-10-14 DIAGNOSIS — M791 Myalgia: Secondary | ICD-10-CM | POA: Diagnosis not present

## 2014-10-14 DIAGNOSIS — I252 Old myocardial infarction: Secondary | ICD-10-CM | POA: Insufficient documentation

## 2014-10-14 DIAGNOSIS — R630 Anorexia: Secondary | ICD-10-CM | POA: Diagnosis not present

## 2014-10-14 DIAGNOSIS — Z87891 Personal history of nicotine dependence: Secondary | ICD-10-CM | POA: Diagnosis not present

## 2014-10-14 DIAGNOSIS — I2510 Atherosclerotic heart disease of native coronary artery without angina pectoris: Secondary | ICD-10-CM | POA: Insufficient documentation

## 2014-10-14 DIAGNOSIS — K219 Gastro-esophageal reflux disease without esophagitis: Secondary | ICD-10-CM

## 2014-10-14 DIAGNOSIS — R05 Cough: Secondary | ICD-10-CM | POA: Insufficient documentation

## 2014-10-14 DIAGNOSIS — R531 Weakness: Secondary | ICD-10-CM | POA: Diagnosis not present

## 2014-10-14 DIAGNOSIS — R000 Tachycardia, unspecified: Secondary | ICD-10-CM | POA: Insufficient documentation

## 2014-10-14 DIAGNOSIS — I959 Hypotension, unspecified: Secondary | ICD-10-CM | POA: Insufficient documentation

## 2014-10-14 DIAGNOSIS — F329 Major depressive disorder, single episode, unspecified: Secondary | ICD-10-CM | POA: Diagnosis not present

## 2014-10-14 DIAGNOSIS — R197 Diarrhea, unspecified: Secondary | ICD-10-CM

## 2014-10-14 DIAGNOSIS — C3412 Malignant neoplasm of upper lobe, left bronchus or lung: Secondary | ICD-10-CM | POA: Insufficient documentation

## 2014-10-14 DIAGNOSIS — E119 Type 2 diabetes mellitus without complications: Secondary | ICD-10-CM | POA: Insufficient documentation

## 2014-10-14 DIAGNOSIS — Z7984 Long term (current) use of oral hypoglycemic drugs: Secondary | ICD-10-CM | POA: Insufficient documentation

## 2014-10-14 DIAGNOSIS — R093 Abnormal sputum: Secondary | ICD-10-CM | POA: Diagnosis not present

## 2014-10-14 DIAGNOSIS — Z23 Encounter for immunization: Secondary | ICD-10-CM | POA: Diagnosis not present

## 2014-10-14 DIAGNOSIS — Z7982 Long term (current) use of aspirin: Secondary | ICD-10-CM | POA: Insufficient documentation

## 2014-10-14 DIAGNOSIS — M1990 Unspecified osteoarthritis, unspecified site: Secondary | ICD-10-CM | POA: Insufficient documentation

## 2014-10-14 DIAGNOSIS — Z951 Presence of aortocoronary bypass graft: Secondary | ICD-10-CM | POA: Insufficient documentation

## 2014-10-14 DIAGNOSIS — D759 Disease of blood and blood-forming organs, unspecified: Secondary | ICD-10-CM | POA: Diagnosis not present

## 2014-10-14 DIAGNOSIS — R5383 Other fatigue: Secondary | ICD-10-CM | POA: Diagnosis not present

## 2014-10-14 DIAGNOSIS — Z5111 Encounter for antineoplastic chemotherapy: Secondary | ICD-10-CM | POA: Diagnosis present

## 2014-10-14 DIAGNOSIS — R509 Fever, unspecified: Secondary | ICD-10-CM | POA: Insufficient documentation

## 2014-10-14 DIAGNOSIS — Z923 Personal history of irradiation: Secondary | ICD-10-CM | POA: Diagnosis not present

## 2014-10-14 DIAGNOSIS — Z79899 Other long term (current) drug therapy: Secondary | ICD-10-CM | POA: Diagnosis not present

## 2014-10-14 DIAGNOSIS — Z9221 Personal history of antineoplastic chemotherapy: Secondary | ICD-10-CM | POA: Diagnosis not present

## 2014-10-14 DIAGNOSIS — C3402 Malignant neoplasm of left main bronchus: Secondary | ICD-10-CM

## 2014-10-14 LAB — COMPREHENSIVE METABOLIC PANEL
ALBUMIN: 3.7 g/dL (ref 3.5–5.0)
ALK PHOS: 84 U/L (ref 38–126)
ALT: 25 U/L (ref 17–63)
ANION GAP: 13 (ref 5–15)
AST: 36 U/L (ref 15–41)
BUN: 23 mg/dL — ABNORMAL HIGH (ref 6–20)
CHLORIDE: 105 mmol/L (ref 101–111)
CO2: 19 mmol/L — AB (ref 22–32)
CREATININE: 1.33 mg/dL — AB (ref 0.61–1.24)
Calcium: 9.1 mg/dL (ref 8.9–10.3)
GFR calc Af Amer: 59 mL/min — ABNORMAL LOW (ref >60)
GFR calc non Af Amer: 51 mL/min — ABNORMAL LOW (ref >60)
GLUCOSE: 211 mg/dL — AB (ref 65–99)
Potassium: 4.8 mmol/L (ref 3.5–5.1)
SODIUM: 137 mmol/L (ref 135–145)
TOTAL PROTEIN: 7.6 g/dL (ref 6.5–8.1)
Total Bilirubin: 0.4 mg/dL (ref 0.3–1.2)

## 2014-10-14 LAB — CBC WITH DIFFERENTIAL/PLATELET
BASOS PCT: 0 %
Basophils Absolute: 0 10*3/uL (ref 0–0.1)
EOS ABS: 0 10*3/uL (ref 0–0.7)
Eosinophils Relative: 0 %
HCT: 32.7 % — ABNORMAL LOW (ref 40.0–52.0)
HEMOGLOBIN: 10.9 g/dL — AB (ref 13.0–18.0)
Lymphocytes Relative: 9 %
Lymphs Abs: 0.5 10*3/uL — ABNORMAL LOW (ref 1.0–3.6)
MCH: 30.2 pg (ref 26.0–34.0)
MCHC: 33.4 g/dL (ref 32.0–36.0)
MCV: 90.4 fL (ref 80.0–100.0)
Monocytes Absolute: 0.3 10*3/uL (ref 0.2–1.0)
Monocytes Relative: 6 %
NEUTROS PCT: 85 %
Neutro Abs: 4.6 10*3/uL (ref 1.4–6.5)
PLATELETS: 187 10*3/uL (ref 150–440)
RBC: 3.61 MIL/uL — AB (ref 4.40–5.90)
RDW: 15.4 % — ABNORMAL HIGH (ref 11.5–14.5)
WBC: 5.5 10*3/uL (ref 3.8–10.6)

## 2014-10-14 LAB — MAGNESIUM: MAGNESIUM: 1.5 mg/dL — AB (ref 1.7–2.4)

## 2014-10-14 MED ORDER — OXYCODONE HCL 5 MG PO TABS: 5.0000 mg | ORAL_TABLET | Freq: Four times a day (QID) | ORAL | Status: AC | PRN

## 2014-10-14 MED ORDER — HEPARIN SOD (PORK) LOCK FLUSH 100 UNIT/ML IV SOLN
500.0000 [IU] | Freq: Once | INTRAVENOUS | Status: AC
  Administered 2014-10-14: 500 [IU] via INTRAVENOUS
  Filled 2014-10-14: qty 5

## 2014-10-14 MED ORDER — SODIUM CHLORIDE 0.9 % IJ SOLN
10.0000 mL | Freq: Once | INTRAMUSCULAR | Status: AC
  Administered 2014-10-14: 10 mL via INTRAVENOUS
  Filled 2014-10-14: qty 10

## 2014-10-14 MED ORDER — MAGNESIUM SULFATE 2 GM/50ML IV SOLN
2.0000 g | Freq: Once | INTRAVENOUS | Status: AC
  Administered 2014-10-14: 2 g via INTRAVENOUS
  Filled 2014-10-14: qty 50

## 2014-10-14 MED ORDER — SODIUM CHLORIDE 0.9 % IV SOLN
8.0000 mg | Freq: Once | INTRAVENOUS | Status: AC
  Administered 2014-10-14: 8 mg via INTRAVENOUS
  Filled 2014-10-14: qty 0.8

## 2014-10-14 NOTE — Progress Notes (Signed)
Patient does have living will.  Former smoker.  Patient has developed a cough that is worse at night.  Also has had diarrhea.  Requesting refill for Oxycodone 5 mg.

## 2014-10-16 ENCOUNTER — Encounter: Payer: Self-pay | Admitting: Oncology

## 2014-10-16 NOTE — Progress Notes (Signed)
[place] @ [facility] Telephone:(336) [phone]  Fax:(336) [place]: 11-Jun-1940  MR#: [id number]  ZCH#:[id number]  Patient Care Team: Albina Billet, MD as PCP - General (Internal Medicine)  CHIEF COMPLAINT:  Chief Complaint  Patient presents with  . OTHER   Oncology History   74 year old gentleman with left hilar mass and mediastinal adenopathy Chronic smoker quit smoking in 2000 Non-insulin-dependent diabetes History of coronary artery disease with status post bypass surgery done in year 2000 1.  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  Patient is starting carboplatinum and Taxol with AUC of 2 and 45 mg/m respectively concurrent with radiation therapy (May 27, 2014) 3.  He has finished total 6 cycles of carboplatinum and Taxol weekly chemotherapy on July 02, 2014    4.  Patient had a PET scan done which shows partial response.  (September, 2016 Patient was start consolidation therapy from September 2 016     Oncology Flowsheet 06/10/2014 06/17/2014 06/24/2014 07/02/2014 09/23/2014 09/30/2014 10/14/2014  Day, Cycle Day 1, Cycle 3 Day 1, Cycle 1 Day 1, Cycle 2 Day 1, Cycle 3 Day 1, Cycle 1 Day 8, Cycle 1 -  CARBOplatin (PARAPLATIN) IV 210 mg 210 mg 210 mg 190 mg 490 mg - -  dexamethasone (DECADRON) IV [ 12 mg ] [ 12 mg ] [ 20 mg ] [ 20 mg ] [ 12 mg ] [ 8 mg ] 8 mg  fosaprepitant (EMEND) IV [ 150 mg ] [ 150 mg ] - - [ 150 mg ] - -  ondansetron (ZOFRAN) IJ - - - - - - -  ondansetron (ZOFRAN) IV - - [ 16 mg ] [ 16 mg ] - [ 8 mg ] -  PACLitaxel (TAXOL) IV 45 mg/m2 45 mg/m2 45 mg/m2 45 mg/m2 90 mg/m2 90 mg/m2 -  palonosetron (ALOXI) IV 0.25 mg 0.25 mg - - 0.25 mg - -    INTERVAL HISTORY: Patient underwent EBUS and biopsy was positive for squamous cell carcinoma of lung.  Was stages IIIa disease.  Here for further follow-up and treatment consideration.  Had one episode of hemoptysis  after bronchoscopy .  Patient was advised to stop taking aspirin.  Hemoptysis has resolved. Dry hacking cough continues . Patient is having dry hacking cough right after chemotherapy he developed diarrhea multiple stool mucous no blood.  No chills.  No fever.  No abdominal pain which is gradually resolved. Cough continues particularly during the nighttime.  Patient is feeling somewhat weak and tired REVIEW OF SYSTEMS:   ROS  general status: Patient is feeling weak and tired.  No change in a performance status.  No chills.  No fever. HEENT: .  No evidence of stomatitis Lungs: Dry hacking cough without any expectoration no chills or fever Cardiac: No chest pain or paroxysmal nocturnal dyspnea GI: Complains of diarrhea which is resolved Skin: No rash Lower extremity no swelling Neurological system: No tingling.  No numbness.  No other focal signs Musculoskeletal system no bony pains  As per HPI. Otherwise, a complete review of systems is negatve.  PAST MEDICAL HISTORY: Past Medical History  Diagnosis Date  . Coronary artery disease   . Pneumonia 2000  . Arthritis   . 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 Bruises easily   . GERD (gastroesophageal reflux  disease)     takes Omeprazole daily as needed for stomach pain  . Blood transfusion 2001  . Diabetes mellitus     takes Metformin daily;  . Impaired hearing     bil hearing aide  . Macular degeneration     being watched for this but hasn't been "completely" diagnosed  . Myocardial infarction [facility]) 2001    PAST SURGICAL HISTORY: Past Surgical History  Procedure Laterality Date  . 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 Cardiac catheterization  2001  . Tonsillectomy      as a child  . Colonoscopy    . Cataract extraction      bilateral  . Lumbar laminectomy/decompression microdiscectomy  12/19/2010    Procedure: LUMBAR LAMINECTOMY/DECOMPRESSION MICRODISCECTOMY;  Surgeon: Elaina Hoops;  Location: [place]  NEUR[facility] ORS;  Service: Neurosurgery;  Laterality: N/A;  Lumbar three-four, four-five decompressive lumbar laminectomy  . Joint replacement  right tkr  . Eye surgery      cataracts  . Back surgery      l4 5  . Portacath placement N/A 05/20/2014    Procedure: INSERTION PORT-A-CATH;  Surgeon: Nestor Lewandowsky, MD;  Location: [facility] ORS;  Service: General;  Laterality: N/A;    FAMILY HISTORY Family History  Problem Relation Age of Onset  . Anesthesia problems Neg Hx   . Hypotension Neg Hx   . Malignant hyperthermia Neg Hx   . Pseudochol deficiency Neg Hx         ADVANCED DIRECTIVES: Patient does have advanced health care directive   HEALTH MAINTENANCE: Social History  Substance Use Topics  . Smoking status: Former Smoker -- 40 years    Types: Cigarettes    Quit date: 05/19/1999  . Smokeless tobacco: Never Used     Comment: 2001  . Alcohol Use: No     No Known Allergies  Current Outpatient Prescriptions  Medication Sig Dispense Refill  . albuterol (PROVENTIL HFA;VENTOLIN HFA) 108 (90 BASE) MCG/ACT inhaler Inhale 2 puffs into the lungs as needed for wheezing or shortness of breath.     . Alcohol Swabs (B-D SINGLE USE SWABS REGULAR) PADS     . ANORO ELLIPTA 62.5-25 MCG/INH AEPB   11  . aspirin 81 MG tablet     . benzonatate (TESSALON) 100 MG capsule Take 1 capsule (100 mg total) by mouth 3 (three) times daily as needed for cough. Apply co-pay to [facility] per Ulice Dash. 90 capsule 0  . beta carotene w/minerals (OCUVITE) tablet Take 1 tablet by mouth 2 (two) times daily.    . Blood Glucose Calibration (TRUETEST CONTROL LEVEL 1) LIQD     . CHERATUSSIN AC 100-10 MG/5ML syrup Take 5 mLs by mouth 3 (three) times daily as needed for cough. Apply co-pay to [facility] per Ulice Dash. 120 mL 0  . [facility] TUSSIN DM 100-10 MG/5ML liquid TAKE 1 TEASPOONFUL (5 MLS) BY MOUTH EVERY 12 (TWELVE) HOURS. 237 mL 1  . dexamethasone (DECADRON) 4 MG tablet Take 1 tablet (4 mg total) by mouth 2 (two) times  daily. Take day before [place] chemotherapy. 10 tablet 0  . diphenoxylate-atropine (LOMOTIL) 2.5-0.025 MG per tablet Take 1 tablet by mouth 4 (four) times daily as needed for diarrhea or loose stools. 45 tablet 1  . fluconazole (DIFLUCAN) 100 MG tablet Take 1 tablet (100 mg total) by mouth daily. 5 tablet 0  . glimepiride (AMARYL) 1 MG tablet     . glucose blood test  strip     . Lancet Devices (TRUEDRAW LANCING DEVICE) MISC     . lidocaine-prilocaine (EMLA) cream     . magnesium oxide (MAG-OX) 400 MG tablet Take 400 mg by mouth daily.      . Melatonin 5 MG CAPS Take 1 capsule (5 mg total) by mouth at bedtime as needed.  0  . metFORMIN (GLUCOPHAGE) 500 MG tablet Take 500 mg by mouth daily. Take '1000mg'$  po in morning, take '500mg'$  po at bedtime    . metoprolol (LOPRESSOR) 50 MG tablet 50 mg 2 (two) times daily.    . naproxen (NAPROSYN) 500 MG tablet     . omeprazole (PRILOSEC) 40 MG capsule Take 40 mg by mouth daily as needed. For acid reflux      . ondansetron (ZOFRAN) 8 MG tablet Take 1 tablet (8 mg total) by mouth 2 (two) times daily. Start the day after chemo for 3 days. Then take as needed for nausea or vomiting. 30 tablet 1  . predniSONE (DELTASONE) 20 MG tablet TAKE 1 TABLET (20 MG TOTAL) BY MOUTH DAILY WITH BREAKFAST. TAKE FOR 15 DAYS. 15 tablet 0  . simvastatin (ZOCOR) 40 MG tablet Take 40 mg by mouth at bedtime.      . sucralfate (CARAFATE) 1 G tablet Take 1 tablet (1 g total) by mouth 4 (four) times daily -  with meals and at bedtime. Dissolve in 3 to 4 tablespoons of warm water, swish and swallow 90 tablet 3  . SYMBICORT 160-4.5 MCG/ACT inhaler   12  . TRUEPLUS LANCETS 28G MISC     . zolpidem (AMBIEN) 10 MG tablet     . oxyCODONE (ROXICODONE) 5 MG immediate release tablet Take 1 tablet (5 mg total) by mouth every 6 (six) hours as needed for severe pain. 30 tablet 0   No current facility-administered medications for this visit.   Facility-Administered Medications Ordered in Other Visits    Medication Dose Route Frequency Provider Last Rate Last Dose  . lidocaine-prilocaine (EMLA) cream   Topical Once Evlyn Kanner, NP      . sodium chloride 0.9 % injection 10 mL  10 mL Intracatheter PRN Forest Gleason, MD   10 mL at 06/03/14 1106  . sodium chloride 0.9 % injection 10 mL  10 mL Intravenous PRN Forest Gleason, MD   10 mL at 07/30/14 0920    OBJECTIVE: Filed Vitals:   10/14/14 0857  BP: 132/81  Pulse: 81  Temp: 96 F (35.6 C)     Body mass index is 29.24 kg/(m^2).    ECOG FS:1 - Symptomatic but completely ambulatory  Physical Exam  Constitutional: No distress.  HENT:  Head: Normocephalic and atraumatic.  Right Ear: External ear normal.  Left Ear: External ear normal.  Nose: Nose normal.  Mouth/Throat: Oropharynx is clear and moist.  Eyes: Conjunctivae and EOM are normal. Pupils are equal, round, and reactive to light.  Neck: Normal range of motion. Neck supple. No JVD present. No tracheal deviation present. No thyromegaly present.  Cardiovascular: Normal rate, normal heart sounds and intact distal pulses.  Exam reveals no gallop and no friction rub.   No murmur heard. Pulmonary/Chest: No stridor. No respiratory distress. He has no wheezes. He has no rales. He exhibits no tenderness.  Abdominal: He exhibits no distension and no mass. There is no tenderness. There is no rebound and no guarding.  Musculoskeletal: He exhibits no edema or tenderness.  Neurological: He displays normal reflexes. No cranial nerve deficit. He  exhibits normal muscle tone. Coordination normal.  Skin: No rash noted. He is not diaphoretic. No erythema.  Psychiatric: Memory, affect and judgment normal.  Nursing note and vitals reviewed.    LAB RESULTS: Lab data has been reviewed     Chemistry         ASSESSMENT: \ 1.  Carcinoma of lung left upper lobe.  Stage IIIa squamous cell carcinoma.  I Patient has finished total 6 cycles of chemotherapy with carboplatinum and Taxol Patient has  started consolidation therapy second cycle.  However because of diarrhea will put chemotherapy on hold.  Patient was advised to drink a lot of fluid.  All of the lab data has been reviewed.  If situation and clinical condition improves patient will consider continuing second cycle of chemotherapy from next week. Diarrhea that this point in time is resolved. Cancer of lung   Staging form: Lung, AJCC 7th Edition     Clinical: T2, N2, M0 - Unsigned   Forest Gleason, MD   10/16/2014 5:32 PM

## 2014-10-19 ENCOUNTER — Other Ambulatory Visit: Payer: Self-pay | Admitting: Oncology

## 2014-10-19 NOTE — Telephone Encounter (Signed)
Has white patches on tongue again and needs refill of fluconazole

## 2014-10-20 ENCOUNTER — Other Ambulatory Visit: Payer: Self-pay | Admitting: Oncology

## 2014-10-21 ENCOUNTER — Inpatient Hospital Stay (HOSPITAL_BASED_OUTPATIENT_CLINIC_OR_DEPARTMENT_OTHER): Payer: Medicare HMO | Admitting: Oncology

## 2014-10-21 ENCOUNTER — Inpatient Hospital Stay: Payer: Medicare HMO

## 2014-10-21 VITALS — BP 109/63 | HR 78 | Temp 97.6°F | Wt 219.8 lb

## 2014-10-21 DIAGNOSIS — C3401 Malignant neoplasm of right main bronchus: Secondary | ICD-10-CM

## 2014-10-21 DIAGNOSIS — R531 Weakness: Secondary | ICD-10-CM

## 2014-10-21 DIAGNOSIS — Z79899 Other long term (current) drug therapy: Secondary | ICD-10-CM

## 2014-10-21 DIAGNOSIS — Z7982 Long term (current) use of aspirin: Secondary | ICD-10-CM

## 2014-10-21 DIAGNOSIS — Z5111 Encounter for antineoplastic chemotherapy: Secondary | ICD-10-CM | POA: Diagnosis not present

## 2014-10-21 DIAGNOSIS — R05 Cough: Secondary | ICD-10-CM | POA: Diagnosis not present

## 2014-10-21 DIAGNOSIS — C3402 Malignant neoplasm of left main bronchus: Secondary | ICD-10-CM

## 2014-10-21 DIAGNOSIS — I252 Old myocardial infarction: Secondary | ICD-10-CM

## 2014-10-21 DIAGNOSIS — E119 Type 2 diabetes mellitus without complications: Secondary | ICD-10-CM

## 2014-10-21 DIAGNOSIS — C3412 Malignant neoplasm of upper lobe, left bronchus or lung: Secondary | ICD-10-CM | POA: Diagnosis not present

## 2014-10-21 DIAGNOSIS — I2510 Atherosclerotic heart disease of native coronary artery without angina pectoris: Secondary | ICD-10-CM

## 2014-10-21 DIAGNOSIS — R5381 Other malaise: Secondary | ICD-10-CM

## 2014-10-21 DIAGNOSIS — M1990 Unspecified osteoarthritis, unspecified site: Secondary | ICD-10-CM

## 2014-10-21 DIAGNOSIS — K219 Gastro-esophageal reflux disease without esophagitis: Secondary | ICD-10-CM

## 2014-10-21 LAB — COMPREHENSIVE METABOLIC PANEL
ALT: 26 U/L (ref 17–63)
ANION GAP: 15 (ref 5–15)
AST: 47 U/L — ABNORMAL HIGH (ref 15–41)
Albumin: 3.8 g/dL (ref 3.5–5.0)
Alkaline Phosphatase: 92 U/L (ref 38–126)
BUN: 20 mg/dL (ref 6–20)
CHLORIDE: 98 mmol/L — AB (ref 101–111)
CO2: 18 mmol/L — AB (ref 22–32)
Calcium: 9.1 mg/dL (ref 8.9–10.3)
Creatinine, Ser: 1.29 mg/dL — ABNORMAL HIGH (ref 0.61–1.24)
GFR calc non Af Amer: 53 mL/min — ABNORMAL LOW (ref >60)
Glucose, Bld: 387 mg/dL — ABNORMAL HIGH (ref 65–99)
POTASSIUM: 4.8 mmol/L (ref 3.5–5.1)
SODIUM: 131 mmol/L — AB (ref 135–145)
Total Bilirubin: 0.6 mg/dL (ref 0.3–1.2)
Total Protein: 7.7 g/dL (ref 6.5–8.1)

## 2014-10-21 LAB — CBC WITH DIFFERENTIAL/PLATELET
Basophils Absolute: 0 10*3/uL (ref 0–0.1)
Basophils Relative: 0 %
EOS ABS: 0 10*3/uL (ref 0–0.7)
EOS PCT: 0 %
HCT: 35.9 % — ABNORMAL LOW (ref 40.0–52.0)
Hemoglobin: 11.9 g/dL — ABNORMAL LOW (ref 13.0–18.0)
LYMPHS ABS: 0.5 10*3/uL — AB (ref 1.0–3.6)
Lymphocytes Relative: 6 %
MCH: 30.3 pg (ref 26.0–34.0)
MCHC: 33.1 g/dL (ref 32.0–36.0)
MCV: 91.3 fL (ref 80.0–100.0)
MONOS PCT: 2 %
Monocytes Absolute: 0.2 10*3/uL (ref 0.2–1.0)
Neutro Abs: 7.9 10*3/uL — ABNORMAL HIGH (ref 1.4–6.5)
Neutrophils Relative %: 92 %
PLATELETS: 142 10*3/uL — AB (ref 150–440)
RBC: 3.94 MIL/uL — ABNORMAL LOW (ref 4.40–5.90)
RDW: 16.1 % — ABNORMAL HIGH (ref 11.5–14.5)
WBC: 8.6 10*3/uL (ref 3.8–10.6)

## 2014-10-21 LAB — MAGNESIUM: Magnesium: 1.4 mg/dL — ABNORMAL LOW (ref 1.7–2.4)

## 2014-10-21 MED ORDER — SODIUM CHLORIDE 0.9 % IV SOLN
Freq: Once | INTRAVENOUS | Status: AC
  Administered 2014-10-21: 13:00:00 via INTRAVENOUS
  Filled 2014-10-21: qty 5

## 2014-10-21 MED ORDER — HEPARIN SOD (PORK) LOCK FLUSH 100 UNIT/ML IV SOLN
500.0000 [IU] | Freq: Once | INTRAVENOUS | Status: AC
  Administered 2014-10-21: 500 [IU] via INTRAVENOUS
  Filled 2014-10-21: qty 5

## 2014-10-21 MED ORDER — PACLITAXEL CHEMO INJECTION 300 MG/50ML
90.0000 mg/m2 | Freq: Once | INTRAVENOUS | Status: AC
  Administered 2014-10-21: 210 mg via INTRAVENOUS
  Filled 2014-10-21: qty 35

## 2014-10-21 MED ORDER — MAGNESIUM SULFATE 2 GM/50ML IV SOLN
2.0000 g | Freq: Once | INTRAVENOUS | Status: AC
  Administered 2014-10-21: 2 g via INTRAVENOUS
  Filled 2014-10-21: qty 50

## 2014-10-21 MED ORDER — SODIUM CHLORIDE 0.9 % IV SOLN
490.0000 mg | Freq: Once | INTRAVENOUS | Status: AC
  Administered 2014-10-21: 490 mg via INTRAVENOUS
  Filled 2014-10-21: qty 49

## 2014-10-21 MED ORDER — SODIUM CHLORIDE 0.9 % IJ SOLN
10.0000 mL | INTRAMUSCULAR | Status: DC | PRN
  Administered 2014-10-21: 10 mL via INTRAVENOUS
  Filled 2014-10-21: qty 10

## 2014-10-21 MED ORDER — SODIUM CHLORIDE 0.9 % IV SOLN
Freq: Once | INTRAVENOUS | Status: AC
  Administered 2014-10-21: 12:00:00 via INTRAVENOUS
  Filled 2014-10-21: qty 1000

## 2014-10-21 MED ORDER — DIPHENHYDRAMINE HCL 50 MG/ML IJ SOLN
50.0000 mg | Freq: Once | INTRAMUSCULAR | Status: AC
  Administered 2014-10-21: 50 mg via INTRAVENOUS
  Filled 2014-10-21: qty 1

## 2014-10-21 MED ORDER — FAMOTIDINE IN NACL 20-0.9 MG/50ML-% IV SOLN
20.0000 mg | Freq: Once | INTRAVENOUS | Status: AC
  Administered 2014-10-21: 20 mg via INTRAVENOUS
  Filled 2014-10-21: qty 50

## 2014-10-21 MED ORDER — PALONOSETRON HCL INJECTION 0.25 MG/5ML
0.2500 mg | Freq: Once | INTRAVENOUS | Status: AC
  Administered 2014-10-21: 0.25 mg via INTRAVENOUS
  Filled 2014-10-21: qty 5

## 2014-10-21 MED ORDER — INFLUENZA VAC SPLIT QUAD 0.5 ML IM SUSY
0.5000 mL | PREFILLED_SYRINGE | Freq: Once | INTRAMUSCULAR | Status: AC
  Administered 2014-10-21: 0.5 mL via INTRAMUSCULAR
  Filled 2014-10-21: qty 0.5

## 2014-10-21 NOTE — Progress Notes (Signed)
13:55:  Per Dr. Oliva Bustard, Taxol is to infuse over one hour today - verbal read back.  LJ

## 2014-10-21 NOTE — Progress Notes (Signed)
Patient does not have living will.  Former smoker. 

## 2014-10-25 ENCOUNTER — Encounter: Payer: Self-pay | Admitting: Oncology

## 2014-10-25 NOTE — Progress Notes (Signed)
[place] @ [facility] [phone]) [phone]  Fax:(336) [place]: 1940/12/17  MR#: [id number]  MHD#:[id number]  Patient Care Team: Albina Billet, MD as PCP - General (Internal Medicine)  CHIEF COMPLAINT:  Chief Complaint  Patient presents with  . OTHER   Oncology History   74 year old gentleman with left hilar mass and mediastinal adenopathy Chronic smoker quit smoking in 2000 Non-insulin-dependent diabetes History of coronary artery disease with status post bypass surgery done in year 2000 1.  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  Patient is starting carboplatinum and Taxol with AUC of 2 and 45 mg/m respectively concurrent with radiation therapy (May 27, 2014) 3.  He has finished total 6 cycles of carboplatinum and Taxol weekly chemotherapy on July 02, 2014    4.  Patient had a PET scan done which shows partial response.  (September, 2016 Patient was start consolidation therapy from September 2 016     Oncology Flowsheet 06/17/2014 06/24/2014 07/02/2014 09/23/2014 09/30/2014 10/14/2014 10/21/2014  Day, Cycle Day 1, Cycle 1 Day 1, Cycle 2 Day 1, Cycle 3 Day 1, Cycle 1 Day 8, Cycle 1 - Day 1, Cycle 2  CARBOplatin (PARAPLATIN) IV 210 mg 210 mg 190 mg 490 mg - - 490 mg  dexamethasone (DECADRON) IV [ 12 mg ] [ 20 mg ] [ 20 mg ] [ 12 mg ] [ 8 mg ] 8 mg [ 12 mg ]  fosaprepitant (EMEND) IV [ 150 mg ] - - [ 150 mg ] - - [ 150 mg ]  ondansetron (ZOFRAN) IJ - - - - - - -  ondansetron (ZOFRAN) IV - [ 16 mg ] [ 16 mg ] - [ 8 mg ] - -  PACLitaxel (TAXOL) IV 45 mg/m2 45 mg/m2 45 mg/m2 90 mg/m2 90 mg/m2 - 90 mg/m2  palonosetron (ALOXI) IV 0.25 mg - - 0.25 mg - - 0.25 mg    INTERVAL HISTORY: Patient underwent EBUS and biopsy was positive for squamous cell carcinoma of lung.  Was stages IIIa disease.  Here for further follow-up and treatment consideration.  Had one episode of hemoptysis  after bronchoscopy .  Patient was advised to stop taking aspirin.  Hemoptysis has resolved. Dry hacking cough continues . Patient came back to initiate second cycle of chemotherapy.  Chemotherapy was delayed because of diarrhea weakness Does not have any nausea vomiting.  Diarrhea is improved.  Dry hacking cough continues.  No tingling.  No numbness. REVIEW OF SYSTEMS:   ROS  general status: Patient is feeling weak and tired.  No change in a performance status.  No chills.  No fever. HEENT: .  No evidence of stomatitis Lungs: Dry hacking cough without any expectoration no chills or fever Cardiac: No chest pain or paroxysmal nocturnal dyspnea GI: Complains of diarrhea which is resolved Skin: No rash Lower extremity no swelling Neurological system: No tingling.  No numbness.  No other focal signs Musculoskeletal system no bony pains  As per HPI. Otherwise, a complete review of systems is negatve.  PAST MEDICAL HISTORY: Past Medical History  Diagnosis Date  . Coronary artery disease   . Pneumonia 2000  . Arthritis   . 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 Bruises easily   . GERD (gastroesophageal reflux disease)     takes Omeprazole  daily as needed for stomach pain  . Blood transfusion 2001  . Diabetes mellitus     takes Metformin daily;  . Impaired hearing     bil hearing aide  . Macular degeneration     being watched for this but hasn't been "completely" diagnosed  . Myocardial infarction [facility]) 2001    PAST SURGICAL HISTORY: Past Surgical History  Procedure Laterality Date  . 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 Cardiac catheterization  2001  . Tonsillectomy      as a child  . Colonoscopy    . Cataract extraction      bilateral  . Lumbar laminectomy/decompression microdiscectomy  12/19/2010    Procedure: LUMBAR LAMINECTOMY/DECOMPRESSION MICRODISCECTOMY;  Surgeon: Elaina Hoops;  Location: [place] [facility];  Service: Neurosurgery;  Laterality: N/A;   Lumbar three-four, four-five decompressive lumbar laminectomy  . Joint replacement  right tkr  . Eye surgery      cataracts  . Back surgery      l4 5  . Portacath placement N/A 05/20/2014    Procedure: INSERTION PORT-A-CATH;  Surgeon: Nestor Lewandowsky, MD;  Location: [facility] ORS;  Service: General;  Laterality: N/A;    FAMILY HISTORY Family History  Problem Relation Age of Onset  . Anesthesia problems Neg Hx   . Hypotension Neg Hx   . Malignant hyperthermia Neg Hx   . Pseudochol deficiency Neg Hx         ADVANCED DIRECTIVES: Patient does have advanced health care directive   HEALTH MAINTENANCE: Social History  Substance Use Topics  . Smoking status: Former Smoker -- 40 years    Types: Cigarettes    Quit date: 05/19/1999  . Smokeless tobacco: Never Used     Comment: 2001  . Alcohol Use: No     No Known Allergies  Current Outpatient Prescriptions  Medication Sig Dispense Refill  . albuterol (PROVENTIL HFA;VENTOLIN HFA) 108 (90 BASE) MCG/ACT inhaler Inhale 2 puffs into the lungs as needed for wheezing or shortness of breath.     . Alcohol Swabs (B-D SINGLE USE SWABS REGULAR) PADS     . ANORO ELLIPTA 62.5-25 MCG/INH AEPB   11  . aspirin 81 MG tablet     . benzonatate (TESSALON) 100 MG capsule TAKE 1 CAPSULE (100 MG TOTAL) BY MOUTH 3 (THREE) TIMES DAILY AS NEEDED FOR COUGH. 60 capsule 0  . beta carotene w/minerals (OCUVITE) tablet Take 1 tablet by mouth 2 (two) times daily.    . Blood Glucose Calibration (TRUETEST CONTROL LEVEL 1) LIQD     . CHERATUSSIN AC 100-10 MG/5ML syrup Take 5 mLs by mouth 3 (three) times daily as needed for cough. Apply co-pay to [facility] per Ulice Dash. 120 mL 0  . [facility] TUSSIN DM 100-10 MG/5ML liquid TAKE 1 TEASPOONFUL (5 MLS) BY MOUTH EVERY 12 (TWELVE) HOURS. 237 mL 1  . dexamethasone (DECADRON) 4 MG tablet Take 1 tablet (4 mg total) by mouth 2 (two) times daily. Take day before [place] chemotherapy. 10 tablet 0  . diphenoxylate-atropine (LOMOTIL)  2.5-0.025 MG per tablet Take 1 tablet by mouth 4 (four) times daily as needed for diarrhea or loose stools. 45 tablet 1  . fluconazole (DIFLUCAN) 100 MG tablet TAKE 1 TABLET (100 MG TOTAL) BY MOUTH DAILY. 5 tablet 0  . glimepiride (AMARYL) 1 MG tablet     . glucose blood test strip     . Lancet Devices (TRUEDRAW LANCING DEVICE) MISC     .  lidocaine-prilocaine (EMLA) cream     . magnesium oxide (MAG-OX) 400 MG tablet Take 400 mg by mouth daily.      . Melatonin 5 MG CAPS Take 1 capsule (5 mg total) by mouth at bedtime as needed.  0  . metFORMIN (GLUCOPHAGE) 500 MG tablet Take 500 mg by mouth daily. Take '1000mg'$  po in morning, take '500mg'$  po at bedtime    . metoprolol (LOPRESSOR) 50 MG tablet 50 mg 2 (two) times daily.    . naproxen (NAPROSYN) 500 MG tablet     . omeprazole (PRILOSEC) 40 MG capsule Take 40 mg by mouth daily as needed. For acid reflux      . ondansetron (ZOFRAN) 8 MG tablet Take 1 tablet (8 mg total) by mouth 2 (two) times daily. Start the day after chemo for 3 days. Then take as needed for nausea or vomiting. 30 tablet 1  . oxyCODONE (ROXICODONE) 5 MG immediate release tablet Take 1 tablet (5 mg total) by mouth every 6 (six) hours as needed for severe pain. 30 tablet 0  . predniSONE (DELTASONE) 20 MG tablet TAKE 1 TABLET (20 MG TOTAL) BY MOUTH DAILY WITH BREAKFAST. TAKE FOR 15 DAYS. 15 tablet 0  . simvastatin (ZOCOR) 40 MG tablet Take 40 mg by mouth at bedtime.      . sucralfate (CARAFATE) 1 G tablet Take 1 tablet (1 g total) by mouth 4 (four) times daily -  with meals and at bedtime. Dissolve in 3 to 4 tablespoons of warm water, swish and swallow 90 tablet 3  . SYMBICORT 160-4.5 MCG/ACT inhaler   12  . TRUEPLUS LANCETS 28G MISC     . zolpidem (AMBIEN) 10 MG tablet      No current facility-administered medications for this visit.   Facility-Administered Medications Ordered in Other Visits  Medication Dose Route Frequency Provider Last Rate Last Dose  . lidocaine-prilocaine  (EMLA) cream   Topical Once Evlyn Kanner, NP      . sodium chloride 0.9 % injection 10 mL  10 mL Intracatheter PRN Forest Gleason, MD   10 mL at 06/03/14 1106  . sodium chloride 0.9 % injection 10 mL  10 mL Intravenous PRN Forest Gleason, MD   10 mL at 07/30/14 0920    OBJECTIVE: Filed Vitals:   10/21/14 1036  BP: 109/63  Pulse: 78  Temp: 97.6 F (36.4 C)     Body mass index is 29.01 kg/(m^2).    ECOG FS:1 - Symptomatic but completely ambulatory  Physical Exam  Constitutional: No distress.  HENT:  Head: Normocephalic and atraumatic.  Right Ear: External ear normal.  Left Ear: External ear normal.  Nose: Nose normal.  Mouth/Throat: Oropharynx is clear and moist.  Eyes: Conjunctivae and EOM are normal. Pupils are equal, round, and reactive to light.  Neck: Normal range of motion. Neck supple. No JVD present. No tracheal deviation present. No thyromegaly present.  Cardiovascular: Normal rate, normal heart sounds and intact distal pulses.  Exam reveals no gallop and no friction rub.   No murmur heard. Pulmonary/Chest: No stridor. No respiratory distress. He has no wheezes. He has no rales. He exhibits no tenderness.  Abdominal: He exhibits no distension and no mass. There is no tenderness. There is no rebound and no guarding.  Musculoskeletal: He exhibits no edema or tenderness.  Neurological: He displays normal reflexes. No cranial nerve deficit. He exhibits normal muscle tone. Coordination normal.  Skin: No rash noted. He is not diaphoretic. No erythema.  Psychiatric: Memory, affect and judgment normal.  Nursing note and vitals reviewed.    LAB RESULTS: Lab data has been reviewed     Chemistry         ASSESSMENT: \ 1.  Carcinoma of lung left upper lobe.  Stage IIIa squamous cell carcinoma.  I Diarrhea has improved some of the side effects have resolved.   We will continue secondary and the last chemotherapy cycle as previously planned All lab data has been  reviewed  Magnesium was low also intravenous magnesium would be given After 2 cycles of consultation treatment a repeat PET scan would be done and patient will be reevaluated at that time Cancer of lung   Staging form: Lung, AJCC 7th Edition     Clinical: T2, N2, M0 - Marni Griffon, MD   10/25/2014 4:09 PM

## 2014-10-28 ENCOUNTER — Inpatient Hospital Stay: Payer: Medicare HMO

## 2014-10-28 ENCOUNTER — Encounter: Payer: Self-pay | Admitting: Oncology

## 2014-10-28 ENCOUNTER — Inpatient Hospital Stay (HOSPITAL_BASED_OUTPATIENT_CLINIC_OR_DEPARTMENT_OTHER): Payer: Medicare HMO | Admitting: Oncology

## 2014-10-28 VITALS — BP 93/63 | HR 124 | Temp 97.9°F | Ht 73.0 in | Wt 217.4 lb

## 2014-10-28 DIAGNOSIS — R630 Anorexia: Secondary | ICD-10-CM

## 2014-10-28 DIAGNOSIS — Z923 Personal history of irradiation: Secondary | ICD-10-CM

## 2014-10-28 DIAGNOSIS — C3402 Malignant neoplasm of left main bronchus: Secondary | ICD-10-CM

## 2014-10-28 DIAGNOSIS — R000 Tachycardia, unspecified: Secondary | ICD-10-CM

## 2014-10-28 DIAGNOSIS — R197 Diarrhea, unspecified: Secondary | ICD-10-CM

## 2014-10-28 DIAGNOSIS — I252 Old myocardial infarction: Secondary | ICD-10-CM

## 2014-10-28 DIAGNOSIS — Z9221 Personal history of antineoplastic chemotherapy: Secondary | ICD-10-CM

## 2014-10-28 DIAGNOSIS — R5383 Other fatigue: Secondary | ICD-10-CM

## 2014-10-28 DIAGNOSIS — Z5111 Encounter for antineoplastic chemotherapy: Secondary | ICD-10-CM | POA: Diagnosis not present

## 2014-10-28 DIAGNOSIS — E119 Type 2 diabetes mellitus without complications: Secondary | ICD-10-CM

## 2014-10-28 DIAGNOSIS — C3412 Malignant neoplasm of upper lobe, left bronchus or lung: Secondary | ICD-10-CM

## 2014-10-28 DIAGNOSIS — I2510 Atherosclerotic heart disease of native coronary artery without angina pectoris: Secondary | ICD-10-CM

## 2014-10-28 DIAGNOSIS — R05 Cough: Secondary | ICD-10-CM

## 2014-10-28 DIAGNOSIS — R531 Weakness: Secondary | ICD-10-CM

## 2014-10-28 LAB — CBC WITH DIFFERENTIAL/PLATELET
BASOS ABS: 0 10*3/uL (ref 0–0.1)
BASOS PCT: 0 %
EOS ABS: 0 10*3/uL (ref 0–0.7)
EOS PCT: 0 %
HCT: 36.3 % — ABNORMAL LOW (ref 40.0–52.0)
Hemoglobin: 11.9 g/dL — ABNORMAL LOW (ref 13.0–18.0)
LYMPHS PCT: 7 %
Lymphs Abs: 0.5 10*3/uL — ABNORMAL LOW (ref 1.0–3.6)
MCH: 29.6 pg (ref 26.0–34.0)
MCHC: 32.7 g/dL (ref 32.0–36.0)
MCV: 90.5 fL (ref 80.0–100.0)
MONO ABS: 0.2 10*3/uL (ref 0.2–1.0)
Monocytes Relative: 2 %
Neutro Abs: 6 10*3/uL (ref 1.4–6.5)
Neutrophils Relative %: 91 %
PLATELETS: 159 10*3/uL (ref 150–440)
RBC: 4.01 MIL/uL — AB (ref 4.40–5.90)
RDW: 15.8 % — AB (ref 11.5–14.5)
WBC: 6.6 10*3/uL (ref 3.8–10.6)

## 2014-10-28 LAB — MAGNESIUM: MAGNESIUM: 1.7 mg/dL (ref 1.7–2.4)

## 2014-10-28 MED ORDER — HEPARIN SOD (PORK) LOCK FLUSH 100 UNIT/ML IV SOLN
500.0000 [IU] | Freq: Once | INTRAVENOUS | Status: AC
  Administered 2014-10-28: 500 [IU] via INTRAVENOUS

## 2014-10-28 MED ORDER — BISMUTH SUBSALICYLATE 262 MG/15ML PO SUSP: 30.0000 mL | Freq: Three times a day (TID) | ORAL | Status: AC | PRN

## 2014-10-28 MED ORDER — SODIUM CHLORIDE 0.9 % IV SOLN
1.0000 g | Freq: Once | INTRAVENOUS | Status: AC
  Administered 2014-10-28: 1 g via INTRAVENOUS
  Filled 2014-10-28: qty 2

## 2014-10-28 MED ORDER — SODIUM CHLORIDE 0.9 % IV SOLN
Freq: Once | INTRAVENOUS | Status: AC
  Administered 2014-10-28: 14:00:00 via INTRAVENOUS
  Filled 2014-10-28: qty 1000

## 2014-10-28 MED ORDER — HEPARIN SOD (PORK) LOCK FLUSH 100 UNIT/ML IV SOLN
INTRAVENOUS | Status: AC
  Filled 2014-10-28: qty 5

## 2014-10-28 MED ORDER — SODIUM CHLORIDE 0.9 % IJ SOLN
10.0000 mL | INTRAMUSCULAR | Status: DC | PRN
  Administered 2014-10-28: 10 mL via INTRAVENOUS
  Filled 2014-10-28: qty 10

## 2014-10-28 NOTE — Progress Notes (Signed)
Patient is pale and lethargic.  Has had watery stool for several days, lomotil not helping with symptoms.  Magnesium level is trending up and WNL

## 2014-10-30 ENCOUNTER — Inpatient Hospital Stay: Payer: Medicare HMO

## 2014-10-30 ENCOUNTER — Telehealth: Payer: Self-pay | Admitting: *Deleted

## 2014-10-30 NOTE — Telephone Encounter (Signed)
Diarrhea "in check" Eating adn drinking ok. Does not think he needs IVF today, please cancel appt and if diarrhea restarts over weekend, will call on Monday

## 2014-10-31 ENCOUNTER — Encounter: Payer: Self-pay | Admitting: Oncology

## 2014-10-31 NOTE — Progress Notes (Signed)
[place] @ [facility] Telephone:(336) [phone]  Fax:(336) [place]: Oct 12, 1940  MR#: [id number]  YKZ#:[id number]  Patient Care Team: Albina Billet, MD as PCP - General (Internal Medicine)  CHIEF COMPLAINT:  Chief Complaint  Patient presents with  . Lung Cancer    watery stool, fatigue, lack of appetitie   Oncology History   74 year old gentleman with left hilar mass and mediastinal adenopathy Chronic smoker quit smoking in 2000 Non-insulin-dependent diabetes History of coronary artery disease with status post bypass surgery done in year 2000 1.  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  Patient is starting carboplatinum and Taxol with AUC of 2 and 45 mg/m respectively concurrent with radiation therapy (May 27, 2014) 3.  He has finished total 6 cycles of carboplatinum and Taxol weekly chemotherapy on July 02, 2014    4.  Patient had a PET scan done which shows partial response.  (September, 2016 Patient was start consolidation therapy from September 2 016 Day 8 of  treatment of second cycle has been discontinued because of progressive side effect in October of 2016    Oncology Flowsheet 06/17/2014 06/24/2014 07/02/2014 09/23/2014 09/30/2014 10/14/2014 10/21/2014  Day, Cycle Day 1, Cycle 1 Day 1, Cycle 2 Day 1, Cycle 3 Day 1, Cycle 1 Day 8, Cycle 1 - Day 1, Cycle 2  CARBOplatin (PARAPLATIN) IV 210 mg 210 mg 190 mg 490 mg - - 490 mg  dexamethasone (DECADRON) IV [ 12 mg ] [ 20 mg ] [ 20 mg ] [ 12 mg ] [ 8 mg ] 8 mg [ 12 mg ]  fosaprepitant (EMEND) IV [ 150 mg ] - - [ 150 mg ] - - [ 150 mg ]  ondansetron (ZOFRAN) IJ - - - - - - -  ondansetron (ZOFRAN) IV - [ 16 mg ] [ 16 mg ] - [ 8 mg ] - -  PACLitaxel (TAXOL) IV 45 mg/m2 45 mg/m2 45 mg/m2 90 mg/m2 90 mg/m2 - 90 mg/m2  palonosetron (ALOXI) IV 0.25 mg - - 0.25 mg - - 0.25 mg    INTERVAL HISTORY: Patient underwent EBUS and biopsy was  positive for squamous cell carcinoma of lung.  Was stages IIIa disease.  Here for further follow-up and treatment consideration.  Had one episode of hemoptysis after bronchoscopy .  Patient was advised to stop taking aspirin.  Hemoptysis has resolved. Dry hacking cough continues Patient is here for ongoing evaluation and continuation of chemotherapy however feeling extremely weak and tired.  Diarrhea continues. . Patient came back to initiate second cycle of chemotherapy.  Chemotherapy was delayed because of diarrhea weakness Does not have any nausea vomiting.  Diarrhea is improved.  Dry hacking cough continues.  No tingling.  No numbness. REVIEW OF SYSTEMS:   ROS  general status: Patient is feeling weak and tired.  No change in a performance status.  No chills.  No fever. HEENT: .  No evidence of stomatitis Lungs: Dry hacking cough without any expectoration no chills or fever Cardiac: No chest pain or paroxysmal nocturnal dyspnea GI: Complains of diarrhea which is resolved Skin: No rash Lower extremity no swelling Neurological system: No tingling.  No numbness.  No other focal signs Musculoskeletal system no bony pains  As per HPI. Otherwise, a complete review of systems is negatve.  PAST MEDICAL HISTORY: Past Medical  History  Diagnosis Date  . Coronary artery disease   . Pneumonia 2000  . Arthritis   . 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 Bruises easily   . GERD (gastroesophageal reflux disease)     takes Omeprazole daily as needed for stomach pain  . Blood transfusion 2001  . Diabetes mellitus     takes Metformin daily;  . Impaired hearing     bil hearing aide  . Macular degeneration     being watched for this but hasn't been "completely" diagnosed  . Myocardial infarction [facility]) 2001    PAST SURGICAL HISTORY: Past Surgical History  Procedure Laterality Date  . 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 Cardiac catheterization  2001  . Tonsillectomy       as a child  . Colonoscopy    . Cataract extraction      bilateral  . Lumbar laminectomy/decompression microdiscectomy  12/19/2010    Procedure: LUMBAR LAMINECTOMY/DECOMPRESSION MICRODISCECTOMY;  Surgeon: Elaina Hoops;  Location: [place] [facility];  Service: Neurosurgery;  Laterality: N/A;  Lumbar three-four, four-five decompressive lumbar laminectomy  . Joint replacement  right tkr  . Eye surgery      cataracts  . Back surgery      l4 5  . Portacath placement N/A 05/20/2014    Procedure: INSERTION PORT-A-CATH;  Surgeon: Nestor Lewandowsky, MD;  Location: [facility] ORS;  Service: General;  Laterality: N/A;    FAMILY HISTORY Family History  Problem Relation Age of Onset  . Anesthesia problems Neg Hx   . Hypotension Neg Hx   . Malignant hyperthermia Neg Hx   . Pseudochol deficiency Neg Hx         ADVANCED DIRECTIVES: Patient does have advanced health care directive   HEALTH MAINTENANCE: Social History  Substance Use Topics  . Smoking status: Former Smoker -- 40 years    Types: Cigarettes    Quit date: 05/19/1999  . Smokeless tobacco: Never Used     Comment: 2001  . Alcohol Use: No     No Known Allergies  Current Outpatient Prescriptions  Medication Sig Dispense Refill  . albuterol (PROVENTIL HFA;VENTOLIN HFA) 108 (90 BASE) MCG/ACT inhaler Inhale 2 puffs into the lungs as needed for wheezing or shortness of breath.     . Alcohol Swabs (B-D SINGLE USE SWABS REGULAR) PADS     . ANORO ELLIPTA 62.5-25 MCG/INH AEPB   11  . benzonatate (TESSALON) 100 MG capsule TAKE 1 CAPSULE (100 MG TOTAL) BY MOUTH 3 (THREE) TIMES DAILY AS NEEDED FOR COUGH. 60 capsule 0  . Blood Glucose Calibration (TRUETEST CONTROL LEVEL 1) LIQD     . [facility] TUSSIN DM 100-10 MG/5ML liquid TAKE 1 TEASPOONFUL (5 MLS) BY MOUTH EVERY 12 (TWELVE) HOURS. 237 mL 1  . dexamethasone (DECADRON) 4 MG tablet Take 1 tablet (4 mg total) by mouth 2 (two) times daily. Take day before [place] chemotherapy. 10 tablet 0  .  diphenoxylate-atropine (LOMOTIL) 2.5-0.025 MG per tablet Take 1 tablet by mouth 4 (four) times daily as needed for diarrhea or loose stools. 45 tablet 1  . glimepiride (AMARYL) 1 MG tablet     . glucose blood test strip     . Lancet Devices (TRUEDRAW LANCING DEVICE) MISC     . lidocaine-prilocaine (EMLA) cream     . magnesium oxide (MAG-OX) 400 MG tablet Take 400 mg by mouth daily.      . Melatonin  5 MG CAPS Take 1 capsule (5 mg total) by mouth at bedtime as needed.  0  . metFORMIN (GLUCOPHAGE) 500 MG tablet Take 500 mg by mouth daily. Take '1000mg'$  po in morning, take '500mg'$  po at bedtime    . metoprolol (LOPRESSOR) 50 MG tablet 50 mg 2 (two) times daily.    . naproxen (NAPROSYN) 500 MG tablet     . omeprazole (PRILOSEC) 40 MG capsule Take 40 mg by mouth daily as needed. For acid reflux      . ondansetron (ZOFRAN) 8 MG tablet Take 1 tablet (8 mg total) by mouth 2 (two) times daily. Start the day after chemo for 3 days. Then take as needed for nausea or vomiting. 30 tablet 1  . oxyCODONE (ROXICODONE) 5 MG immediate release tablet Take 1 tablet (5 mg total) by mouth every 6 (six) hours as needed for severe pain. 30 tablet 0  . simvastatin (ZOCOR) 40 MG tablet Take 40 mg by mouth at bedtime.      . sucralfate (CARAFATE) 1 G tablet Take 1 tablet (1 g total) by mouth 4 (four) times daily -  with meals and at bedtime. Dissolve in 3 to 4 tablespoons of warm water, swish and swallow 90 tablet 3  . SYMBICORT 160-4.5 MCG/ACT inhaler   12  . TRUEPLUS LANCETS 28G MISC     . aspirin 81 MG tablet     . beta carotene w/minerals (OCUVITE) tablet Take 1 tablet by mouth 2 (two) times daily.    Marland Kitchen bismuth subsalicylate (KAOPECTATE) 262 MG/15ML suspension Take 30 mLs by mouth 3 (three) times daily as needed. 360 mL 0   No current facility-administered medications for this visit.   Facility-Administered Medications Ordered in Other Visits  Medication Dose Route Frequency Provider Last Rate Last Dose  .  lidocaine-prilocaine (EMLA) cream   Topical Once Evlyn Kanner, NP      . sodium chloride 0.9 % injection 10 mL  10 mL Intracatheter PRN Forest Gleason, MD   10 mL at 06/03/14 1106  . sodium chloride 0.9 % injection 10 mL  10 mL Intravenous PRN Forest Gleason, MD   10 mL at 07/30/14 0920    OBJECTIVE: Filed Vitals:   10/28/14 1403  BP:   Pulse:   Temp: 97.9 F (36.6 C)     Body mass index is 28.69 kg/(m^2).    ECOG FS:1 - Symptomatic but completely ambulatory  Physical Exam  Constitutional: No distress.  HENT:  Head: Normocephalic and atraumatic.  Right Ear: External ear normal.  Left Ear: External ear normal.  Nose: Nose normal.  Mouth/Throat: Oropharynx is clear and moist.  Eyes: Conjunctivae and EOM are normal. Pupils are equal, round, and reactive to light.  Neck: Normal range of motion. Neck supple. No JVD present. No tracheal deviation present. No thyromegaly present.  Cardiovascular: Normal rate, normal heart sounds and intact distal pulses.  Exam reveals no gallop and no friction rub.   No murmur heard. Pulmonary/Chest: No stridor. No respiratory distress. He has no wheezes. He has no rales. He exhibits no tenderness.  Abdominal: He exhibits no distension and no mass. There is no tenderness. There is no rebound and no guarding.  Musculoskeletal: He exhibits no edema or tenderness.  Neurological: He displays normal reflexes. No cranial nerve deficit. He exhibits normal muscle tone. Coordination normal.  Skin: No rash noted. He is not diaphoretic. No erythema.  Psychiatric: Memory, affect and judgment normal.  Nursing note and vitals reviewed.  LAB RESULTS: Lab data has been reviewed WBC 6.6. Hemoglobin 11.9    Chemistry         ASSESSMENT: \ 1.  Carcinoma of lung left upper lobe.  Stage IIIa squamous cell carcinoma.  I Due to continuing diarrhea.  Further chemotherapy D evaluation of patient next few months with a repeat PET scan Patient was asked to call me  if diarrhea continues Patient was slightly tachycardic Will give IV fluid for suspected dehydration Referred for survivorship evaluation     Cancer of lung   Staging form: Lung, AJCC 7th Edition     Clinical: T2, N2, M0 - Marni Griffon, MD   10/31/2014 4:28 PM

## 2014-11-02 ENCOUNTER — Telehealth: Payer: Self-pay | Admitting: *Deleted

## 2014-11-02 DIAGNOSIS — R197 Diarrhea, unspecified: Secondary | ICD-10-CM

## 2014-11-02 DIAGNOSIS — R531 Weakness: Secondary | ICD-10-CM

## 2014-11-02 NOTE — Telephone Encounter (Signed)
Come in tomorrow cbc, metc, mag see md Agrees to be here at 9 am tomorrow

## 2014-11-02 NOTE — Telephone Encounter (Signed)
Diarrhea has improved and he is eating and drinking well, but he is very weak, can hardly walk and breathes heavy with exertion. What to do?

## 2014-11-03 ENCOUNTER — Inpatient Hospital Stay (HOSPITAL_BASED_OUTPATIENT_CLINIC_OR_DEPARTMENT_OTHER): Payer: Medicare HMO | Admitting: Oncology

## 2014-11-03 ENCOUNTER — Inpatient Hospital Stay: Payer: Medicare HMO

## 2014-11-03 ENCOUNTER — Ambulatory Visit
Admission: RE | Admit: 2014-11-03 | Discharge: 2014-11-03 | Disposition: A | Discharge status: Home / Self Care | Payer: Medicare HMO | Source: Ambulatory Visit | Attending: Oncology | Admitting: Oncology

## 2014-11-03 ENCOUNTER — Encounter: Payer: Self-pay | Admitting: Oncology

## 2014-11-03 VITALS — BP 84/52 | HR 91 | Temp 96.7°F | Wt 220.2 lb

## 2014-11-03 DIAGNOSIS — R531 Weakness: Secondary | ICD-10-CM

## 2014-11-03 DIAGNOSIS — Z87891 Personal history of nicotine dependence: Secondary | ICD-10-CM

## 2014-11-03 DIAGNOSIS — C3412 Malignant neoplasm of upper lobe, left bronchus or lung: Secondary | ICD-10-CM | POA: Diagnosis not present

## 2014-11-03 DIAGNOSIS — D759 Disease of blood and blood-forming organs, unspecified: Secondary | ICD-10-CM | POA: Diagnosis not present

## 2014-11-03 DIAGNOSIS — F329 Major depressive disorder, single episode, unspecified: Secondary | ICD-10-CM

## 2014-11-03 DIAGNOSIS — C3402 Malignant neoplasm of left main bronchus: Secondary | ICD-10-CM | POA: Diagnosis present

## 2014-11-03 DIAGNOSIS — I959 Hypotension, unspecified: Secondary | ICD-10-CM | POA: Diagnosis not present

## 2014-11-03 DIAGNOSIS — R5383 Other fatigue: Secondary | ICD-10-CM

## 2014-11-03 DIAGNOSIS — M791 Myalgia: Secondary | ICD-10-CM

## 2014-11-03 DIAGNOSIS — R509 Fever, unspecified: Secondary | ICD-10-CM

## 2014-11-03 DIAGNOSIS — Z5111 Encounter for antineoplastic chemotherapy: Secondary | ICD-10-CM | POA: Diagnosis not present

## 2014-11-03 DIAGNOSIS — J984 Other disorders of lung: Secondary | ICD-10-CM | POA: Insufficient documentation

## 2014-11-03 DIAGNOSIS — R630 Anorexia: Secondary | ICD-10-CM

## 2014-11-03 DIAGNOSIS — R05 Cough: Secondary | ICD-10-CM

## 2014-11-03 DIAGNOSIS — R093 Abnormal sputum: Secondary | ICD-10-CM

## 2014-11-03 DIAGNOSIS — R197 Diarrhea, unspecified: Secondary | ICD-10-CM

## 2014-11-03 LAB — CBC WITH DIFFERENTIAL/PLATELET
BASOS ABS: 0 10*3/uL (ref 0–0.1)
Basophils Relative: 1 %
EOS ABS: 0 10*3/uL (ref 0–0.7)
EOS PCT: 1 %
HCT: 34.9 % — ABNORMAL LOW (ref 40.0–52.0)
Hemoglobin: 11.4 g/dL — ABNORMAL LOW (ref 13.0–18.0)
LYMPHS ABS: 0.4 10*3/uL — AB (ref 1.0–3.6)
LYMPHS PCT: 22 %
MCH: 29.5 pg (ref 26.0–34.0)
MCHC: 32.7 g/dL (ref 32.0–36.0)
MCV: 90.3 fL (ref 80.0–100.0)
MONO ABS: 0.5 10*3/uL (ref 0.2–1.0)
Monocytes Relative: 25 %
Neutro Abs: 0.9 10*3/uL — ABNORMAL LOW (ref 1.4–6.5)
Neutrophils Relative %: 51 %
PLATELETS: 119 10*3/uL — AB (ref 150–440)
RBC: 3.86 MIL/uL — ABNORMAL LOW (ref 4.40–5.90)
RDW: 16.1 % — AB (ref 11.5–14.5)
WBC: 1.8 10*3/uL — AB (ref 3.8–10.6)

## 2014-11-03 LAB — COMPREHENSIVE METABOLIC PANEL
ALBUMIN: 3.6 g/dL (ref 3.5–5.0)
ALK PHOS: 69 U/L (ref 38–126)
ALT: 34 U/L (ref 17–63)
AST: 33 U/L (ref 15–41)
Anion gap: 10 (ref 5–15)
BILIRUBIN TOTAL: 0.3 mg/dL (ref 0.3–1.2)
BUN: 15 mg/dL (ref 6–20)
CO2: 21 mmol/L — AB (ref 22–32)
Calcium: 8.9 mg/dL (ref 8.9–10.3)
Chloride: 106 mmol/L (ref 101–111)
Creatinine, Ser: 1.41 mg/dL — ABNORMAL HIGH (ref 0.61–1.24)
GFR calc Af Amer: 55 mL/min — ABNORMAL LOW (ref >60)
GFR calc non Af Amer: 47 mL/min — ABNORMAL LOW (ref >60)
Glucose, Bld: 180 mg/dL — ABNORMAL HIGH (ref 65–99)
Potassium: 4 mmol/L (ref 3.5–5.1)
Sodium: 137 mmol/L (ref 135–145)
Total Protein: 6.5 g/dL (ref 6.5–8.1)

## 2014-11-03 LAB — MAGNESIUM: MAGNESIUM: 1.5 mg/dL — AB (ref 1.7–2.4)

## 2014-11-03 MED ORDER — SODIUM CHLORIDE 0.9 % IV SOLN
2.0000 g | Freq: Once | INTRAVENOUS | Status: AC
  Administered 2014-11-03: 2 g via INTRAVENOUS
  Filled 2014-11-03: qty 2

## 2014-11-03 MED ORDER — SODIUM CHLORIDE 0.9 % IJ SOLN
10.0000 mL | INTRAMUSCULAR | Status: DC | PRN
  Filled 2014-11-03: qty 10

## 2014-11-03 MED ORDER — HEPARIN SOD (PORK) LOCK FLUSH 100 UNIT/ML IV SOLN
500.0000 [IU] | Freq: Once | INTRAVENOUS | Status: AC | PRN
  Administered 2014-11-03: 500 [IU]
  Filled 2014-11-03: qty 5

## 2014-11-03 MED ORDER — SODIUM CHLORIDE 0.9 % IV SOLN
Freq: Once | INTRAVENOUS | Status: AC
  Administered 2014-11-03: 12:00:00 via INTRAVENOUS
  Filled 2014-11-03: qty 1000

## 2014-11-03 MED ORDER — MAGNESIUM SULFATE 2 GM/50ML IV SOLN
2.0000 g | Freq: Once | INTRAVENOUS | Status: AC
  Administered 2014-11-03: 2 g via INTRAVENOUS
  Filled 2014-11-03: qty 50

## 2014-11-03 MED ORDER — LEVOFLOXACIN 500 MG PO TABS: 500.0000 mg | ORAL_TABLET | Freq: Every day | ORAL | Status: DC

## 2014-11-03 NOTE — Progress Notes (Signed)
Patient states he is sob and weak.  Gives out of breath when walking.  Skin turgor tents.  BP 84/52.

## 2014-11-04 ENCOUNTER — Inpatient Hospital Stay: Payer: Medicare HMO | Admitting: Oncology

## 2014-11-04 ENCOUNTER — Inpatient Hospital Stay: Payer: Medicare HMO

## 2014-11-04 ENCOUNTER — Other Ambulatory Visit: Payer: Medicare HMO

## 2014-11-04 VITALS — BP 90/58 | HR 64 | Temp 96.2°F

## 2014-11-04 DIAGNOSIS — Z5111 Encounter for antineoplastic chemotherapy: Secondary | ICD-10-CM | POA: Diagnosis not present

## 2014-11-04 DIAGNOSIS — C3402 Malignant neoplasm of left main bronchus: Secondary | ICD-10-CM

## 2014-11-04 MED ORDER — HEPARIN SOD (PORK) LOCK FLUSH 100 UNIT/ML IV SOLN
500.0000 [IU] | Freq: Once | INTRAVENOUS | Status: AC
  Administered 2014-11-04: 500 [IU] via INTRAVENOUS

## 2014-11-04 MED ORDER — SODIUM CHLORIDE 0.9 % IV SOLN
2.0000 g | Freq: Once | INTRAVENOUS | Status: AC
  Administered 2014-11-04: 2 g via INTRAVENOUS
  Filled 2014-11-04: qty 2

## 2014-11-04 MED ORDER — SODIUM CHLORIDE 0.9 % IJ SOLN
10.0000 mL | INTRAMUSCULAR | Status: DC | PRN
  Filled 2014-11-04: qty 10

## 2014-11-04 MED ORDER — SODIUM CHLORIDE 0.9 % IV SOLN
INTRAVENOUS | Status: DC
  Administered 2014-11-04: 09:00:00 via INTRAVENOUS
  Filled 2014-11-04: qty 1000

## 2014-11-04 MED ORDER — SODIUM CHLORIDE 0.9 % IV SOLN
Freq: Once | INTRAVENOUS | Status: AC
  Administered 2014-11-04: 09:00:00 via INTRAVENOUS
  Filled 2014-11-04: qty 1000

## 2014-11-05 ENCOUNTER — Inpatient Hospital Stay (HOSPITAL_BASED_OUTPATIENT_CLINIC_OR_DEPARTMENT_OTHER): Payer: Medicare HMO | Admitting: Oncology

## 2014-11-05 ENCOUNTER — Inpatient Hospital Stay: Payer: Medicare HMO

## 2014-11-05 VITALS — BP 101/55 | HR 61 | Temp 96.3°F | Wt 222.4 lb

## 2014-11-05 DIAGNOSIS — Z87891 Personal history of nicotine dependence: Secondary | ICD-10-CM

## 2014-11-05 DIAGNOSIS — Z7984 Long term (current) use of oral hypoglycemic drugs: Secondary | ICD-10-CM

## 2014-11-05 DIAGNOSIS — C3402 Malignant neoplasm of left main bronchus: Secondary | ICD-10-CM

## 2014-11-05 DIAGNOSIS — R197 Diarrhea, unspecified: Secondary | ICD-10-CM

## 2014-11-05 DIAGNOSIS — E119 Type 2 diabetes mellitus without complications: Secondary | ICD-10-CM

## 2014-11-05 DIAGNOSIS — R053 Chronic cough: Secondary | ICD-10-CM

## 2014-11-05 DIAGNOSIS — Z7982 Long term (current) use of aspirin: Secondary | ICD-10-CM

## 2014-11-05 DIAGNOSIS — Z79899 Other long term (current) drug therapy: Secondary | ICD-10-CM

## 2014-11-05 DIAGNOSIS — C3401 Malignant neoplasm of right main bronchus: Secondary | ICD-10-CM

## 2014-11-05 DIAGNOSIS — D759 Disease of blood and blood-forming organs, unspecified: Secondary | ICD-10-CM

## 2014-11-05 DIAGNOSIS — R531 Weakness: Secondary | ICD-10-CM

## 2014-11-05 DIAGNOSIS — R05 Cough: Secondary | ICD-10-CM

## 2014-11-05 DIAGNOSIS — R5383 Other fatigue: Secondary | ICD-10-CM

## 2014-11-05 DIAGNOSIS — C3412 Malignant neoplasm of upper lobe, left bronchus or lung: Secondary | ICD-10-CM | POA: Diagnosis not present

## 2014-11-05 DIAGNOSIS — R630 Anorexia: Secondary | ICD-10-CM

## 2014-11-05 DIAGNOSIS — I2510 Atherosclerotic heart disease of native coronary artery without angina pectoris: Secondary | ICD-10-CM

## 2014-11-05 DIAGNOSIS — Z5111 Encounter for antineoplastic chemotherapy: Secondary | ICD-10-CM | POA: Diagnosis not present

## 2014-11-05 LAB — CBC WITH DIFFERENTIAL/PLATELET
BASOS PCT: 1 %
Basophils Absolute: 0 10*3/uL (ref 0–0.1)
Eosinophils Absolute: 0 10*3/uL (ref 0–0.7)
Eosinophils Relative: 1 %
HEMATOCRIT: 31.9 % — AB (ref 40.0–52.0)
HEMOGLOBIN: 10.4 g/dL — AB (ref 13.0–18.0)
LYMPHS ABS: 0.4 10*3/uL — AB (ref 1.0–3.6)
Lymphocytes Relative: 16 %
MCH: 29.7 pg (ref 26.0–34.0)
MCHC: 32.8 g/dL (ref 32.0–36.0)
MCV: 90.6 fL (ref 80.0–100.0)
MONO ABS: 0.4 10*3/uL (ref 0.2–1.0)
MONOS PCT: 15 %
NEUTROS ABS: 1.8 10*3/uL (ref 1.4–6.5)
NEUTROS PCT: 67 %
Platelets: 104 10*3/uL — ABNORMAL LOW (ref 150–440)
RBC: 3.52 MIL/uL — ABNORMAL LOW (ref 4.40–5.90)
RDW: 16.3 % — AB (ref 11.5–14.5)
WBC: 2.6 10*3/uL — ABNORMAL LOW (ref 3.8–10.6)

## 2014-11-05 LAB — COMPREHENSIVE METABOLIC PANEL
ALBUMIN: 3.6 g/dL (ref 3.5–5.0)
ALK PHOS: 77 U/L (ref 38–126)
ALT: 31 U/L (ref 17–63)
ANION GAP: 10 (ref 5–15)
AST: 33 U/L (ref 15–41)
BILIRUBIN TOTAL: 0.5 mg/dL (ref 0.3–1.2)
BUN: 18 mg/dL (ref 6–20)
CALCIUM: 8.6 mg/dL — AB (ref 8.9–10.3)
CO2: 22 mmol/L (ref 22–32)
CREATININE: 1.22 mg/dL (ref 0.61–1.24)
Chloride: 106 mmol/L (ref 101–111)
GFR, EST NON AFRICAN AMERICAN: 56 mL/min — AB (ref >60)
GLUCOSE: 189 mg/dL — AB (ref 65–99)
POTASSIUM: 4.3 mmol/L (ref 3.5–5.1)
Sodium: 138 mmol/L (ref 135–145)
Total Protein: 6.6 g/dL (ref 6.5–8.1)

## 2014-11-05 LAB — URINE CULTURE

## 2014-11-05 LAB — MAGNESIUM: Magnesium: 1.6 mg/dL — ABNORMAL LOW (ref 1.7–2.4)

## 2014-11-05 MED ORDER — SODIUM CHLORIDE 0.9 % IJ SOLN
10.0000 mL | Freq: Once | INTRAMUSCULAR | Status: AC
  Administered 2014-11-05: 10 mL via INTRAVENOUS
  Filled 2014-11-05: qty 10

## 2014-11-05 MED ORDER — SODIUM CHLORIDE 0.9 % IV SOLN
Freq: Once | INTRAVENOUS | Status: AC
  Administered 2014-11-05: 10:00:00 via INTRAVENOUS
  Filled 2014-11-05: qty 1000

## 2014-11-05 MED ORDER — SODIUM CHLORIDE 0.9 % IV SOLN
2.0000 g | Freq: Once | INTRAVENOUS | Status: AC
  Administered 2014-11-05: 2 g via INTRAVENOUS
  Filled 2014-11-05: qty 2

## 2014-11-05 MED ORDER — HEPARIN SOD (PORK) LOCK FLUSH 100 UNIT/ML IV SOLN
500.0000 [IU] | Freq: Once | INTRAVENOUS | Status: AC
  Administered 2014-11-05: 500 [IU] via INTRAVENOUS

## 2014-11-05 MED ORDER — HEPARIN SOD (PORK) LOCK FLUSH 100 UNIT/ML IV SOLN
INTRAVENOUS | Status: AC
  Filled 2014-11-05: qty 5

## 2014-11-06 ENCOUNTER — Encounter: Payer: Self-pay | Admitting: Oncology

## 2014-11-06 NOTE — Progress Notes (Signed)
[place] @ [facility] [phone]) [phone]  Fax:(336) [place]: 1940-03-12  MR#: [id number]  QTM#:[id number]  Patient Care Team: Albina Billet, MD as PCP - General (Internal Medicine)  CHIEF COMPLAINT:  Chief Complaint  Patient presents with  . OTHER   Oncology History   74 year old gentleman with left hilar mass and mediastinal adenopathy Chronic smoker quit smoking in 2000 Non-insulin-dependent diabetes History of coronary artery disease with status post bypass surgery done in year 2000 1.  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  Patient is starting carboplatinum and Taxol with AUC of 2 and 45 mg/m respectively concurrent with radiation therapy (May 27, 2014) 3.  He has finished total 6 cycles of carboplatinum and Taxol weekly chemotherapy on July 02, 2014    4.  Patient had a PET scan done which shows partial response.  (September, 2016 Patient was start consolidation therapy from September 2 016 Day 8 of  treatment of second cycle has been discontinued because of progressive side effect in October of 2016    Oncology Flowsheet 06/17/2014 06/24/2014 07/02/2014 09/23/2014 09/30/2014 10/14/2014 10/21/2014  Day, Cycle Day 1, Cycle 1 Day 1, Cycle 2 Day 1, Cycle 3 Day 1, Cycle 1 Day 8, Cycle 1 - Day 1, Cycle 2  CARBOplatin (PARAPLATIN) IV 210 mg 210 mg 190 mg 490 mg - - 490 mg  dexamethasone (DECADRON) IV [ 12 mg ] [ 20 mg ] [ 20 mg ] [ 12 mg ] [ 8 mg ] 8 mg [ 12 mg ]  fosaprepitant (EMEND) IV [ 150 mg ] - - [ 150 mg ] - - [ 150 mg ]  ondansetron (ZOFRAN) IJ - - - - - - -  ondansetron (ZOFRAN) IV - [ 16 mg ] [ 16 mg ] - [ 8 mg ] - -  PACLitaxel (TAXOL) IV 45 mg/m2 45 mg/m2 45 mg/m2 90 mg/m2 90 mg/m2 - 90 mg/m2  palonosetron (ALOXI) IV 0.25 mg - - 0.25 mg - - 0.25 mg    INTERVAL HISTORY: Patient had myelosuppression and low-grade fever.  All the cultures were done and patient  was started on IV antibiotics.  Feeling somewhat stronger.  No chills.  No fever.  Repeat blood count shows gradually increase Patient is feeling stronger.  Urine culture shows multiple organisms suggesting contamination of blood cultures were negative so far.  He should not is here for ongoing evaluation did not by Levaquin which was called in REVIEW OF SYSTEMS:   ROS  general status: Patient is feeling weak and tired.  No change in a performance status.  No chills.  No fever. HEENT: .  No evidence of stomatitis Lungs: Dry hacking cough without any expectoration no chills or fever Cardiac: No chest pain or paroxysmal nocturnal dyspnea GI: Complains of diarrhea which is resolved Skin: No rash Lower extremity no swelling Neurological system: No tingling.  No numbness.  No other focal signs Musculoskeletal system no bony pains  As per HPI. Otherwise, a complete review of systems is negatve.  PAST MEDICAL HISTORY: Past Medical History  Diagnosis Date  . Coronary artery disease   . Pneumonia 2000  . Arthritis   . 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 Bruises easily   . GERD (gastroesophageal reflux disease)     takes  Omeprazole daily as needed for stomach pain  . Blood transfusion 2001  . Diabetes mellitus     takes Metformin daily;  . Impaired hearing     bil hearing aide  . Macular degeneration     being watched for this but hasn't been "completely" diagnosed  . Myocardial infarction [facility]) 2001    PAST SURGICAL HISTORY: Past Surgical History  Procedure Laterality Date  . 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 Cardiac catheterization  2001  . Tonsillectomy      as a child  . Colonoscopy    . Cataract extraction      bilateral  . Lumbar laminectomy/decompression microdiscectomy  12/19/2010    Procedure: LUMBAR LAMINECTOMY/DECOMPRESSION MICRODISCECTOMY;  Surgeon: Elaina Hoops;  Location: [place] [facility];  Service: Neurosurgery;  Laterality: N/A;  Lumbar  three-four, four-five decompressive lumbar laminectomy  . Joint replacement  right tkr  . Eye surgery      cataracts  . Back surgery      l4 5  . Portacath placement N/A 05/20/2014    Procedure: INSERTION PORT-A-CATH;  Surgeon: Nestor Lewandowsky, MD;  Location: [facility] ORS;  Service: General;  Laterality: N/A;    FAMILY HISTORY Family History  Problem Relation Age of Onset  . Anesthesia problems Neg Hx   . Hypotension Neg Hx   . Malignant hyperthermia Neg Hx   . Pseudochol deficiency Neg Hx         ADVANCED DIRECTIVES: Patient does have advanced health care directive   HEALTH MAINTENANCE: Social History  Substance Use Topics  . Smoking status: Former Smoker -- 40 years    Types: Cigarettes    Quit date: 05/19/1999  . Smokeless tobacco: Never Used     Comment: 2001  . Alcohol Use: No     No Known Allergies  Current Outpatient Prescriptions  Medication Sig Dispense Refill  . albuterol (PROVENTIL HFA;VENTOLIN HFA) 108 (90 BASE) MCG/ACT inhaler Inhale 2 puffs into the lungs as needed for wheezing or shortness of breath.     . Alcohol Swabs (B-D SINGLE USE SWABS REGULAR) PADS     . ANORO ELLIPTA 62.5-25 MCG/INH AEPB   11  . aspirin 81 MG tablet     . benzonatate (TESSALON) 100 MG capsule TAKE 1 CAPSULE (100 MG TOTAL) BY MOUTH 3 (THREE) TIMES DAILY AS NEEDED FOR COUGH. 60 capsule 0  . beta carotene w/minerals (OCUVITE) tablet Take 1 tablet by mouth 2 (two) times daily.    Marland Kitchen bismuth subsalicylate (KAOPECTATE) 262 MG/15ML suspension Take 30 mLs by mouth 3 (three) times daily as needed. 360 mL 0  . Blood Glucose Calibration (TRUETEST CONTROL LEVEL 1) LIQD     . [employer] TUSSIN DM 100-10 MG/5ML liquid TAKE 1 TEASPOONFUL (5 MLS) BY MOUTH EVERY 12 (TWELVE) HOURS. 237 mL 1  . dexamethasone (DECADRON) 4 MG tablet Take 1 tablet (4 mg total) by mouth 2 (two) times daily. Take day before [place] chemotherapy. 10 tablet 0  . diphenoxylate-atropine (LOMOTIL) 2.5-0.025 MG per tablet Take 1 tablet by  mouth 4 (four) times daily as needed for diarrhea or loose stools. 45 tablet 1  . glimepiride (AMARYL) 1 MG tablet     . glucose blood test strip     . Lancet Devices (TRUEDRAW LANCING DEVICE) MISC     . levofloxacin (LEVAQUIN) 500 MG tablet Take 1 tablet (500 mg total) by mouth daily. 7 tablet 0  . lidocaine-prilocaine (EMLA) cream     .  magnesium oxide (MAG-OX) 400 MG tablet Take 400 mg by mouth daily.      . Melatonin 5 MG CAPS Take 1 capsule (5 mg total) by mouth at bedtime as needed.  0  . metFORMIN (GLUCOPHAGE) 500 MG tablet Take 500 mg by mouth daily. Take '1000mg'$  po in morning, take '500mg'$  po at bedtime    . metoprolol (LOPRESSOR) 50 MG tablet daily. 50 mg 2 (two) times daily.    . naproxen (NAPROSYN) 500 MG tablet     . omeprazole (PRILOSEC) 40 MG capsule Take 40 mg by mouth daily as needed. For acid reflux      . ondansetron (ZOFRAN) 8 MG tablet Take 1 tablet (8 mg total) by mouth 2 (two) times daily. Start the day after chemo for 3 days. Then take as needed for nausea or vomiting. 30 tablet 1  . oxyCODONE (ROXICODONE) 5 MG immediate release tablet Take 1 tablet (5 mg total) by mouth every 6 (six) hours as needed for severe pain. 30 tablet 0  . simvastatin (ZOCOR) 40 MG tablet Take 40 mg by mouth at bedtime.      . sucralfate (CARAFATE) 1 G tablet Take 1 tablet (1 g total) by mouth 4 (four) times daily -  with meals and at bedtime. Dissolve in 3 to 4 tablespoons of warm water, swish and swallow 90 tablet 3  . SYMBICORT 160-4.5 MCG/ACT inhaler   12  . TRUEPLUS LANCETS 28G MISC      No current facility-administered medications for this visit.   Facility-Administered Medications Ordered in Other Visits  Medication Dose Route Frequency Provider Last Rate Last Dose  . lidocaine-prilocaine (EMLA) cream   Topical Once Evlyn Kanner, NP      . sodium chloride 0.9 % injection 10 mL  10 mL Intracatheter PRN Forest Gleason, MD   10 mL at 06/03/14 1106  . sodium chloride 0.9 % injection 10 mL   10 mL Intravenous PRN Forest Gleason, MD   10 mL at 07/30/14 0920    OBJECTIVE: Filed Vitals:   11/05/14 0848  BP: 101/55  Pulse: 61  Temp: 96.3 F (35.7 C)     Body mass index is 29.35 kg/(m^2).    ECOG FS:1 - Symptomatic but completely ambulatory  Physical Exam  Constitutional: No distress.  HENT:  Head: Normocephalic and atraumatic.  Right Ear: External ear normal.  Left Ear: External ear normal.  Nose: Nose normal.  Mouth/Throat: Oropharynx is clear and moist.  Eyes: Conjunctivae and EOM are normal. Pupils are equal, round, and reactive to light.  Neck: Normal range of motion. Neck supple. No JVD present. No tracheal deviation present. No thyromegaly present.  Cardiovascular: Normal rate, normal heart sounds and intact distal pulses.  Exam reveals no gallop and no friction rub.   No murmur heard. Pulmonary/Chest: No stridor. No respiratory distress. He has no wheezes. He has no rales. He exhibits no tenderness.  Abdominal: He exhibits no distension and no mass. There is no tenderness. There is no rebound and no guarding.  Musculoskeletal: He exhibits no edema or tenderness.  Neurological: He displays normal reflexes. No cranial nerve deficit. He exhibits normal muscle tone. Coordination normal.  Skin: No rash noted. He is not diaphoretic. No erythema.  Psychiatric: Memory, affect and judgment normal.  Nursing note and vitals reviewed.        Chemistry      WBCs 2.5 with neutrophil count is 1.8. Platelet count is 104   ASSESSMENT: \ 1.  Carcinoma of lung left upper lobe.  Stage IIIa squamous cell carcinoma.  I Myelosuppression is improved.  Patient will receive IV Rocephin today and then will switch over to by mouth Levaquin Reevaluate patient in 4-6 weeks unless develops problems     Cancer of lung   Staging form: Lung, AJCC 7th Edition     Clinical: T2, N2, M0 - Marni Griffon, MD   11/06/2014 7:43 PM

## 2014-11-08 LAB — CULTURE, BLOOD (ROUTINE X 2)
CULTURE: NO GROWTH
CULTURE: NO GROWTH

## 2014-11-09 ENCOUNTER — Encounter: Payer: Self-pay | Admitting: Oncology

## 2014-11-09 NOTE — Progress Notes (Signed)
[place] @ [facility] [phone]) [phone]  Fax:(336) [place]: 28-Apr-1940  MR#: [id number]  KDT#:[id number]  Patient Care Team: Albina Billet, MD as PCP - General (Internal Medicine)  CHIEF COMPLAINT:  Chief Complaint  Patient presents with  . OTHER   Oncology History   74 year old gentleman with left hilar mass and mediastinal adenopathy Chronic smoker quit smoking in 2000 Non-insulin-dependent diabetes History of coronary artery disease with status post bypass surgery done in year 2000 1.  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  Patient is starting carboplatinum and Taxol with AUC of 2 and 45 mg/m respectively concurrent with radiation therapy (May 27, 2014) 3.  He has finished total 6 cycles of carboplatinum and Taxol weekly chemotherapy on July 02, 2014    4.  Patient had a PET scan done which shows partial response.  (September, 2016 Patient was start consolidation therapy from September 2 016 Day 8 of  treatment of second cycle has been discontinued because of progressive side effect in October of 2016    Oncology Flowsheet 06/17/2014 06/24/2014 07/02/2014 09/23/2014 09/30/2014 10/14/2014 10/21/2014  Day, Cycle Day 1, Cycle 1 Day 1, Cycle 2 Day 1, Cycle 3 Day 1, Cycle 1 Day 8, Cycle 1 - Day 1, Cycle 2  CARBOplatin (PARAPLATIN) IV 210 mg 210 mg 190 mg 490 mg - - 490 mg  dexamethasone (DECADRON) IV [ 12 mg ] [ 20 mg ] [ 20 mg ] [ 12 mg ] [ 8 mg ] 8 mg [ 12 mg ]  fosaprepitant (EMEND) IV [ 150 mg ] - - [ 150 mg ] - - [ 150 mg ]  ondansetron (ZOFRAN) IJ - - - - - - -  ondansetron (ZOFRAN) IV - [ 16 mg ] [ 16 mg ] - [ 8 mg ] - -  PACLitaxel (TAXOL) IV 45 mg/m2 45 mg/m2 45 mg/m2 90 mg/m2 90 mg/m2 - 90 mg/m2  palonosetron (ALOXI) IV 0.25 mg - - 0.25 mg - - 0.25 mg    INTERVAL HISTORY: Patient underwent EBUS and biopsy was positive for squamous cell carcinoma of lung.  Was  stages IIIa disease.   Patient has not received the last day 8 chemotherapy.  Patient came is an acute add-on complaining of weakness low-grade fever. Appetite is poor.  Complaining of aches and pains all over the body.  Cough yellowish expectoration Dear for further follow-up and treatment consideration .  REVIEW OF SYSTEMS:   ROS  general status: Patient is feeling weak and tired.  No change in a performance status.  No chills.  No fever. HEENT: .  No evidence of stomatitis Lungs: Dry hacking cough without any expectoration no chills or fever Cardiac: No chest pain or paroxysmal nocturnal dyspnea GI: Complains of diarrhea which is resolved Skin: No rash Lower extremity no swelling Neurological system: No tingling.  No numbness.  No other focal signs Musculoskeletal system no bony pains  As per HPI. Otherwise, a complete review of systems is negatve.  PAST MEDICAL HISTORY: Past Medical History  Diagnosis Date  . Coronary artery disease   . Pneumonia 2000  . Arthritis   . 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 Bruises easily   . GERD (gastroesophageal reflux disease)     takes Omeprazole daily as needed for  stomach pain  . Blood transfusion 2001  . Diabetes mellitus     takes Metformin daily;  . Impaired hearing     bil hearing aide  . Macular degeneration     being watched for this but hasn't been "completely" diagnosed  . Myocardial infarction [facility]) 2001    PAST SURGICAL HISTORY: Past Surgical History  Procedure Laterality Date  . 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 Cardiac catheterization  2001  . Tonsillectomy      as a child  . Colonoscopy    . Cataract extraction      bilateral  . Lumbar laminectomy/decompression microdiscectomy  12/19/2010    Procedure: LUMBAR LAMINECTOMY/DECOMPRESSION MICRODISCECTOMY;  Surgeon: Elaina Hoops;  Location: [place] [facility];  Service: Neurosurgery;  Laterality: N/A;  Lumbar three-four, four-five decompressive  lumbar laminectomy  . Joint replacement  right tkr  . Eye surgery      cataracts  . Back surgery      l4 5  . Portacath placement N/A 05/20/2014    Procedure: INSERTION PORT-A-CATH;  Surgeon: Nestor Lewandowsky, MD;  Location: [facility] ORS;  Service: General;  Laterality: N/A;    FAMILY HISTORY Family History  Problem Relation Age of Onset  . Anesthesia problems Neg Hx   . Hypotension Neg Hx   . Malignant hyperthermia Neg Hx   . Pseudochol deficiency Neg Hx         ADVANCED DIRECTIVES: Patient does have advanced health care directive   HEALTH MAINTENANCE: Social History  Substance Use Topics  . Smoking status: Former Smoker -- 40 years    Types: Cigarettes    Quit date: 05/19/1999  . Smokeless tobacco: Never Used     Comment: 2001  . Alcohol Use: No     No Known Allergies  Current Outpatient Prescriptions  Medication Sig Dispense Refill  . albuterol (PROVENTIL HFA;VENTOLIN HFA) 108 (90 BASE) MCG/ACT inhaler Inhale 2 puffs into the lungs as needed for wheezing or shortness of breath.     . Alcohol Swabs (B-D SINGLE USE SWABS REGULAR) PADS     . ANORO ELLIPTA 62.5-25 MCG/INH AEPB   11  . aspirin 81 MG tablet     . benzonatate (TESSALON) 100 MG capsule TAKE 1 CAPSULE (100 MG TOTAL) BY MOUTH 3 (THREE) TIMES DAILY AS NEEDED FOR COUGH. 60 capsule 0  . beta carotene w/minerals (OCUVITE) tablet Take 1 tablet by mouth 2 (two) times daily.    Marland Kitchen bismuth subsalicylate (KAOPECTATE) 262 MG/15ML suspension Take 30 mLs by mouth 3 (three) times daily as needed. 360 mL 0  . Blood Glucose Calibration (TRUETEST CONTROL LEVEL 1) LIQD     . [employer]S TUSSIN DM 100-10 MG/5ML liquid TAKE 1 TEASPOONFUL (5 MLS) BY MOUTH EVERY 12 (TWELVE) HOURS. 237 mL 1  . dexamethasone (DECADRON) 4 MG tablet Take 1 tablet (4 mg total) by mouth 2 (two) times daily. Take day before [place] chemotherapy. 10 tablet 0  . diphenoxylate-atropine (LOMOTIL) 2.5-0.025 MG per tablet Take 1 tablet by mouth 4 (four) times daily as needed  for diarrhea or loose stools. 45 tablet 1  . glimepiride (AMARYL) 1 MG tablet     . glucose blood test strip     . Lancet Devices (TRUEDRAW LANCING DEVICE) MISC     . lidocaine-prilocaine (EMLA) cream     . magnesium oxide (MAG-OX) 400 MG tablet Take 400 mg by mouth daily.      . Melatonin 5 MG CAPS  Take 1 capsule (5 mg total) by mouth at bedtime as needed.  0  . metFORMIN (GLUCOPHAGE) 500 MG tablet Take 500 mg by mouth daily. Take '1000mg'$  po in morning, take '500mg'$  po at bedtime    . metoprolol (LOPRESSOR) 50 MG tablet daily. 50 mg 2 (two) times daily.    . naproxen (NAPROSYN) 500 MG tablet     . ondansetron (ZOFRAN) 8 MG tablet Take 1 tablet (8 mg total) by mouth 2 (two) times daily. Start the day after chemo for 3 days. Then take as needed for nausea or vomiting. 30 tablet 1  . oxyCODONE (ROXICODONE) 5 MG immediate release tablet Take 1 tablet (5 mg total) by mouth every 6 (six) hours as needed for severe pain. 30 tablet 0  . simvastatin (ZOCOR) 40 MG tablet Take 40 mg by mouth at bedtime.      . sucralfate (CARAFATE) 1 G tablet Take 1 tablet (1 g total) by mouth 4 (four) times daily -  with meals and at bedtime. Dissolve in 3 to 4 tablespoons of warm water, swish and swallow 90 tablet 3  . SYMBICORT 160-4.5 MCG/ACT inhaler   12  . TRUEPLUS LANCETS 28G MISC     . levofloxacin (LEVAQUIN) 500 MG tablet Take 1 tablet (500 mg total) by mouth daily. 7 tablet 0  . omeprazole (PRILOSEC) 40 MG capsule Take 40 mg by mouth daily as needed. For acid reflux       No current facility-administered medications for this visit.   Facility-Administered Medications Ordered in Other Visits  Medication Dose Route Frequency Provider Last Rate Last Dose  . lidocaine-prilocaine (EMLA) cream   Topical Once Evlyn Kanner, NP      . sodium chloride 0.9 % injection 10 mL  10 mL Intracatheter PRN Forest Gleason, MD   10 mL at 06/03/14 1106  . sodium chloride 0.9 % injection 10 mL  10 mL Intravenous PRN Forest Gleason,  MD   10 mL at 07/30/14 0920    OBJECTIVE: Filed Vitals:   11/03/14 0959  BP: 84/52  Pulse: 91  Temp: 96.7 F (35.9 C)     Body mass index is 29.06 kg/(m^2).    ECOG FS:1 - Symptomatic but completely ambulatory  Physical Exam mance status i General  status: Patient looks somewhat depressed.  Not any acute distress. .  Since last evaluation there is no significant change in the general status HEENT: No evidence of stomatitis. Sclera and conjunctivae :: No jaundice.   pale looking. Lungs: Air  entry equal on both sides.  No rhonchi.  No rales.  Cardiac: Heart sounds are normal.  No pericardial rub.  No murmur. Lymphatic system: Cervical, axillary, inguinal, lymph nodes not palpable GI: Abdomen is soft.liver and spleen not palpable.  No ascites.  Bowel sounds are normal.  No other palpable masses.  No tenderness . Lower extremity: No edema Neurological system: Higher functions, cranial nerves intact no evidence of peripheral neuropathy. Skin: No rash.  No ecchymosis.. No petechial hemorrhages LAB RESULTS:  WBC 1.8.  Absolute neutrophil count of 0.9.   Chemistry         ASSESSMENT: \ 1.  Carcinoma of lung left upper lobe.  Stage IIIa squamous cell carcinoma.  I Myelosuppression low-grade fever We will get blood cultures and urine culture start IV antibiotic with ceftriaxone if blood cultures are negative then patient would be started on by mouth Levaquin Hypotension start patient on IV fluids and carefully monitored Patient will be  reexamined in the infusion area to be sure that blood pressure is improved after IV fluid      Cancer of lung   Staging form: Lung, AJCC 7th Edition     Clinical: T2, N2, M0 - Marni Griffon, MD   11/09/2014 8:03 AM

## 2014-11-17 ENCOUNTER — Inpatient Hospital Stay: Payer: Medicare HMO | Attending: Oncology

## 2014-11-17 ENCOUNTER — Other Ambulatory Visit: Payer: Self-pay | Admitting: Oncology

## 2014-11-17 DIAGNOSIS — Z5189 Encounter for other specified aftercare: Secondary | ICD-10-CM

## 2014-11-17 DIAGNOSIS — R05 Cough: Secondary | ICD-10-CM

## 2014-11-17 DIAGNOSIS — C3412 Malignant neoplasm of upper lobe, left bronchus or lung: Secondary | ICD-10-CM | POA: Insufficient documentation

## 2014-11-17 DIAGNOSIS — R053 Chronic cough: Secondary | ICD-10-CM

## 2014-11-17 DIAGNOSIS — C3402 Malignant neoplasm of left main bronchus: Secondary | ICD-10-CM

## 2014-11-17 LAB — COMPREHENSIVE METABOLIC PANEL
ALT: 23 U/L (ref 17–63)
AST: 40 U/L (ref 15–41)
Albumin: 4.2 g/dL (ref 3.5–5.0)
Alkaline Phosphatase: 84 U/L (ref 38–126)
Anion gap: 10 (ref 5–15)
BUN: 21 mg/dL — ABNORMAL HIGH (ref 6–20)
CHLORIDE: 104 mmol/L (ref 101–111)
CO2: 22 mmol/L (ref 22–32)
CREATININE: 1.12 mg/dL (ref 0.61–1.24)
Calcium: 9.4 mg/dL (ref 8.9–10.3)
Glucose, Bld: 191 mg/dL — ABNORMAL HIGH (ref 65–99)
POTASSIUM: 4.4 mmol/L (ref 3.5–5.1)
SODIUM: 136 mmol/L (ref 135–145)
Total Bilirubin: 0.5 mg/dL (ref 0.3–1.2)
Total Protein: 7.4 g/dL (ref 6.5–8.1)

## 2014-11-17 LAB — CBC WITH DIFFERENTIAL/PLATELET
BASOS ABS: 0 10*3/uL (ref 0–0.1)
Basophils Relative: 0 %
Eosinophils Absolute: 0 10*3/uL (ref 0–0.7)
Eosinophils Relative: 1 %
HEMATOCRIT: 33.5 % — AB (ref 40.0–52.0)
HEMOGLOBIN: 10.9 g/dL — AB (ref 13.0–18.0)
LYMPHS ABS: 0.7 10*3/uL — AB (ref 1.0–3.6)
LYMPHS PCT: 17 %
MCH: 29.6 pg (ref 26.0–34.0)
MCHC: 32.4 g/dL (ref 32.0–36.0)
MCV: 91.2 fL (ref 80.0–100.0)
Monocytes Absolute: 0.5 10*3/uL (ref 0.2–1.0)
Monocytes Relative: 14 %
NEUTROS ABS: 2.7 10*3/uL (ref 1.4–6.5)
NEUTROS PCT: 68 %
Platelets: 134 10*3/uL — ABNORMAL LOW (ref 150–440)
RBC: 3.68 MIL/uL — ABNORMAL LOW (ref 4.40–5.90)
RDW: 17.5 % — AB (ref 11.5–14.5)
WBC: 4 10*3/uL (ref 3.8–10.6)

## 2014-11-17 LAB — MAGNESIUM: MAGNESIUM: 1.6 mg/dL — AB (ref 1.7–2.4)

## 2014-11-17 NOTE — Telephone Encounter (Signed)
Per Dr Oliva Bustard, patient to come in for CBC today he will come in at 1130 today. Also ok to refill his fluconazole per Dr Oliva Bustard

## 2014-11-17 NOTE — Telephone Encounter (Signed)
I called wife to inquire as to why he needs fluconazole; she reports that he has white patches all in his mouth after having taken antibiotics. She is also reporting that he has a bright red fine slightly raised rash on his legs for past 3 days

## 2014-12-02 ENCOUNTER — Other Ambulatory Visit: Payer: Self-pay | Admitting: *Deleted

## 2014-12-02 DIAGNOSIS — C3402 Malignant neoplasm of left main bronchus: Secondary | ICD-10-CM

## 2014-12-10 ENCOUNTER — Encounter: Payer: Self-pay | Admitting: Oncology

## 2014-12-10 ENCOUNTER — Ambulatory Visit
Admission: RE | Admit: 2014-12-10 | Discharge: 2014-12-10 | Disposition: A | Discharge status: Home / Self Care | Payer: Medicare HMO | Source: Ambulatory Visit | Attending: Oncology | Admitting: Oncology

## 2014-12-10 ENCOUNTER — Inpatient Hospital Stay: Payer: Medicare HMO | Attending: Oncology

## 2014-12-10 ENCOUNTER — Inpatient Hospital Stay (HOSPITAL_BASED_OUTPATIENT_CLINIC_OR_DEPARTMENT_OTHER): Payer: Medicare HMO | Admitting: Oncology

## 2014-12-10 VITALS — BP 118/69 | HR 87 | Temp 98.1°F | Wt 222.9 lb

## 2014-12-10 DIAGNOSIS — Z87891 Personal history of nicotine dependence: Secondary | ICD-10-CM | POA: Diagnosis not present

## 2014-12-10 DIAGNOSIS — R531 Weakness: Secondary | ICD-10-CM | POA: Insufficient documentation

## 2014-12-10 DIAGNOSIS — R590 Localized enlarged lymph nodes: Secondary | ICD-10-CM | POA: Insufficient documentation

## 2014-12-10 DIAGNOSIS — R5383 Other fatigue: Secondary | ICD-10-CM

## 2014-12-10 DIAGNOSIS — R05 Cough: Secondary | ICD-10-CM | POA: Diagnosis present

## 2014-12-10 DIAGNOSIS — Z7984 Long term (current) use of oral hypoglycemic drugs: Secondary | ICD-10-CM | POA: Diagnosis not present

## 2014-12-10 DIAGNOSIS — C3402 Malignant neoplasm of left main bronchus: Secondary | ICD-10-CM

## 2014-12-10 DIAGNOSIS — F329 Major depressive disorder, single episode, unspecified: Secondary | ICD-10-CM | POA: Diagnosis not present

## 2014-12-10 DIAGNOSIS — E119 Type 2 diabetes mellitus without complications: Secondary | ICD-10-CM | POA: Insufficient documentation

## 2014-12-10 DIAGNOSIS — J209 Acute bronchitis, unspecified: Secondary | ICD-10-CM | POA: Diagnosis not present

## 2014-12-10 DIAGNOSIS — R053 Chronic cough: Secondary | ICD-10-CM

## 2014-12-10 DIAGNOSIS — J449 Chronic obstructive pulmonary disease, unspecified: Secondary | ICD-10-CM | POA: Insufficient documentation

## 2014-12-10 DIAGNOSIS — R222 Localized swelling, mass and lump, trunk: Secondary | ICD-10-CM

## 2014-12-10 DIAGNOSIS — Z923 Personal history of irradiation: Secondary | ICD-10-CM | POA: Insufficient documentation

## 2014-12-10 DIAGNOSIS — Z7982 Long term (current) use of aspirin: Secondary | ICD-10-CM | POA: Insufficient documentation

## 2014-12-10 DIAGNOSIS — M549 Dorsalgia, unspecified: Secondary | ICD-10-CM | POA: Insufficient documentation

## 2014-12-10 DIAGNOSIS — Z794 Long term (current) use of insulin: Secondary | ICD-10-CM | POA: Insufficient documentation

## 2014-12-10 DIAGNOSIS — G8929 Other chronic pain: Secondary | ICD-10-CM | POA: Diagnosis not present

## 2014-12-10 DIAGNOSIS — Z951 Presence of aortocoronary bypass graft: Secondary | ICD-10-CM | POA: Insufficient documentation

## 2014-12-10 DIAGNOSIS — I2510 Atherosclerotic heart disease of native coronary artery without angina pectoris: Secondary | ICD-10-CM | POA: Diagnosis not present

## 2014-12-10 DIAGNOSIS — Z9221 Personal history of antineoplastic chemotherapy: Secondary | ICD-10-CM

## 2014-12-10 DIAGNOSIS — I252 Old myocardial infarction: Secondary | ICD-10-CM | POA: Insufficient documentation

## 2014-12-10 DIAGNOSIS — R918 Other nonspecific abnormal finding of lung field: Secondary | ICD-10-CM | POA: Insufficient documentation

## 2014-12-10 DIAGNOSIS — C3412 Malignant neoplasm of upper lobe, left bronchus or lung: Secondary | ICD-10-CM | POA: Diagnosis not present

## 2014-12-10 DIAGNOSIS — Z85118 Personal history of other malignant neoplasm of bronchus and lung: Secondary | ICD-10-CM | POA: Insufficient documentation

## 2014-12-10 DIAGNOSIS — Z79899 Other long term (current) drug therapy: Secondary | ICD-10-CM | POA: Insufficient documentation

## 2014-12-10 LAB — CBC WITH DIFFERENTIAL/PLATELET
BASOS ABS: 0.1 10*3/uL (ref 0–0.1)
Basophils Relative: 1 %
EOS PCT: 2 %
Eosinophils Absolute: 0.1 10*3/uL (ref 0–0.7)
HEMATOCRIT: 31.9 % — AB (ref 40.0–52.0)
HEMOGLOBIN: 10.5 g/dL — AB (ref 13.0–18.0)
LYMPHS ABS: 0.8 10*3/uL — AB (ref 1.0–3.6)
LYMPHS PCT: 13 %
MCH: 30.1 pg (ref 26.0–34.0)
MCHC: 32.9 g/dL (ref 32.0–36.0)
MCV: 91.6 fL (ref 80.0–100.0)
Monocytes Absolute: 0.7 10*3/uL (ref 0.2–1.0)
Monocytes Relative: 12 %
NEUTROS ABS: 4.2 10*3/uL (ref 1.4–6.5)
Neutrophils Relative %: 72 %
PLATELETS: 200 10*3/uL (ref 150–440)
RBC: 3.49 MIL/uL — AB (ref 4.40–5.90)
RDW: 18.7 % — ABNORMAL HIGH (ref 11.5–14.5)
WBC: 5.8 10*3/uL (ref 3.8–10.6)

## 2014-12-10 LAB — COMPREHENSIVE METABOLIC PANEL
ALBUMIN: 3.9 g/dL (ref 3.5–5.0)
ALT: 19 U/L (ref 17–63)
AST: 31 U/L (ref 15–41)
Alkaline Phosphatase: 96 U/L (ref 38–126)
Anion gap: 12 (ref 5–15)
BUN: 21 mg/dL — AB (ref 6–20)
CHLORIDE: 103 mmol/L (ref 101–111)
CO2: 21 mmol/L — AB (ref 22–32)
CREATININE: 1.18 mg/dL (ref 0.61–1.24)
Calcium: 9.1 mg/dL (ref 8.9–10.3)
GFR calc Af Amer: >60 mL/min (ref >60)
GFR, EST NON AFRICAN AMERICAN: 59 mL/min — AB (ref >60)
GLUCOSE: 238 mg/dL — AB (ref 65–99)
POTASSIUM: 4.4 mmol/L (ref 3.5–5.1)
Sodium: 136 mmol/L (ref 135–145)
Total Bilirubin: 0.7 mg/dL (ref 0.3–1.2)
Total Protein: 7.7 g/dL (ref 6.5–8.1)

## 2014-12-10 LAB — MAGNESIUM: MAGNESIUM: 1.5 mg/dL — AB (ref 1.7–2.4)

## 2014-12-10 MED ORDER — AZITHROMYCIN 500 MG PO TABS: 500.0000 mg | ORAL_TABLET | Freq: Every day | ORAL | Status: DC

## 2014-12-10 MED ORDER — PREDNISONE 10 MG PO TABS
ORAL_TABLET | ORAL | Status: DC
Start: 2014-12-10 — End: 2015-01-12

## 2014-12-11 ENCOUNTER — Encounter: Payer: Self-pay | Admitting: Oncology

## 2014-12-11 ENCOUNTER — Inpatient Hospital Stay: Payer: Medicare HMO

## 2014-12-11 DIAGNOSIS — C3412 Malignant neoplasm of upper lobe, left bronchus or lung: Secondary | ICD-10-CM | POA: Diagnosis not present

## 2014-12-11 DIAGNOSIS — C3402 Malignant neoplasm of left main bronchus: Secondary | ICD-10-CM

## 2014-12-11 MED ORDER — SODIUM CHLORIDE 0.9 % IJ SOLN
10.0000 mL | INTRAMUSCULAR | Status: DC | PRN
  Administered 2014-12-11: 10 mL
  Filled 2014-12-11: qty 10

## 2014-12-11 MED ORDER — MAGNESIUM SULFATE 2 GM/50ML IV SOLN
2.0000 g | Freq: Once | INTRAVENOUS | Status: AC
  Administered 2014-12-11: 2 g via INTRAVENOUS
  Filled 2014-12-11: qty 50

## 2014-12-11 MED ORDER — HEPARIN SOD (PORK) LOCK FLUSH 100 UNIT/ML IV SOLN
500.0000 [IU] | Freq: Once | INTRAVENOUS | Status: AC | PRN
  Administered 2014-12-11: 500 [IU]
  Filled 2014-12-11: qty 5

## 2014-12-11 MED ORDER — SODIUM CHLORIDE 0.9 % IV SOLN
INTRAVENOUS | Status: AC
  Administered 2014-12-11: 09:00:00 via INTRAVENOUS
  Filled 2014-12-11: qty 1000

## 2014-12-11 NOTE — Progress Notes (Signed)
[place] @ [facility] Telephone:(336) [phone]  Fax:(336) [place]: 1940/11/26  MR#: [id number]  JYN#:[id number]  Patient Care Team: Albina Billet, MD as PCP - General (Internal Medicine)  CHIEF COMPLAINT:  Chief Complaint  Patient presents with  . Lung Cancer  . Fatigue  . Fever   Oncology History   74 year old gentleman with left hilar mass and mediastinal adenopathy Chronic smoker quit smoking in 2000 Non-insulin-dependent diabetes History of coronary artery disease with status post bypass surgery done in year 2000 1.  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  Patient is starting carboplatinum and Taxol with AUC of 2 and 45 mg/m respectively concurrent with radiation therapy (May 27, 2014) 3.  He has finished total 6 cycles of carboplatinum and Taxol weekly chemotherapy on July 02, 2014    4.  Patient had a PET scan done which shows partial response.  (September, 2016 Patient was start consolidation therapy from September 2 016 Day 8 of  treatment of second cycle has been discontinued because of progressive side effect in October of 2016    Oncology Flowsheet 06/17/2014 06/24/2014 07/02/2014 09/23/2014 09/30/2014 10/14/2014 10/21/2014  Day, Cycle Day 1, Cycle 1 Day 1, Cycle 2 Day 1, Cycle 3 Day 1, Cycle 1 Day 8, Cycle 1 - Day 1, Cycle 2  CARBOplatin (PARAPLATIN) IV 210 mg 210 mg 190 mg 490 mg - - 490 mg  dexamethasone (DECADRON) IV [ 12 mg ] [ 20 mg ] [ 20 mg ] [ 12 mg ] [ 8 mg ] 8 mg [ 12 mg ]  fosaprepitant (EMEND) IV [ 150 mg ] - - [ 150 mg ] - - [ 150 mg ]  ondansetron (ZOFRAN) IJ - - - - - - -  ondansetron (ZOFRAN) IV - [ 16 mg ] [ 16 mg ] - [ 8 mg ] - -  PACLitaxel (TAXOL) IV 45 mg/m2 45 mg/m2 45 mg/m2 90 mg/m2 90 mg/m2 - 90 mg/m2  palonosetron (ALOXI) IV 0.25 mg - - 0.25 mg - - 0.25 mg    INTERVAL HISTORY: Patient had myelosuppression and low-grade fever.  All the  cultures were done and patient was started on IV antibiotics.  Feeling somewhat stronger.   Patient continues to feel weak and tired.  Has cough.  Yellowish expectoration.  Low-grade fever off and on.  Patient has noticed some swelling around the port site.  Somewhat depressed here for further follow-up regarding carcinoma of lung ROS  general status: Patient is feeling weak and tired.  No change in a performance status.  No chills.  No fever. HEENT: .  No evidence of stomatitis Lungs: Dry hacking cough with yellowish expectoration and low-grade fever Patient has noticed some swelling in the right chest wall area around the port site Cardiac: No chest pain or paroxysmal nocturnal dyspnea GI: Complains of diarrhea which is resolved Skin: No rash Lower extremity no swelling Neurological system: No tingling.  No numbness.  No other focal signs Musculoskeletal system no bony pains  As per HPI. Otherwise, a complete review of systems is negatve.  PAST MEDICAL HISTORY: Past Medical History  Diagnosis Date  . Coronary artery disease   . Pneumonia 2000  . Arthritis   . 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 Bruises easily   .  GERD (gastroesophageal reflux disease)     takes Omeprazole daily as needed for stomach pain  . Blood transfusion 2001  . Diabetes mellitus     takes Metformin daily;  . Impaired hearing     bil hearing aide  . Macular degeneration     being watched for this but hasn't been "completely" diagnosed  . Myocardial infarction [facility]) 2001    PAST SURGICAL HISTORY: Past Surgical History  Procedure Laterality Date  . 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 Cardiac catheterization  2001  . Tonsillectomy      as a child  . Colonoscopy    . Cataract extraction      bilateral  . Lumbar laminectomy/decompression microdiscectomy  12/19/2010    Procedure: LUMBAR LAMINECTOMY/DECOMPRESSION MICRODISCECTOMY;  Surgeon: Elaina Hoops;  Location: [place] [facility]UR[facility]  ORS;  Service: Neurosurgery;  Laterality: N/A;  Lumbar three-four, four-five decompressive lumbar laminectomy  . Joint replacement  right tkr  . Eye surgery      cataracts  . Back surgery      l4 5  . Portacath placement N/A 05/20/2014    Procedure: INSERTION PORT-A-CATH;  Surgeon: Nestor Lewandowsky, MD;  Location: [facility] ORS;  Service: General;  Laterality: N/A;    FAMILY HISTORY Family History  Problem Relation Age of Onset  . Anesthesia problems Neg Hx   . Hypotension Neg Hx   . Malignant hyperthermia Neg Hx   . Pseudochol deficiency Neg Hx         ADVANCED DIRECTIVES: Patient does have advanced health care directive   HEALTH MAINTENANCE: Social History  Substance Use Topics  . Smoking status: Former Smoker -- 40 years    Types: Cigarettes    Quit date: 05/19/1999  . Smokeless tobacco: Never Used     Comment: 2001  . Alcohol Use: No     No Known Allergies  Current Outpatient Prescriptions  Medication Sig Dispense Refill  . albuterol (PROVENTIL HFA;VENTOLIN HFA) 108 (90 BASE) MCG/ACT inhaler Inhale 2 puffs into the lungs as needed for wheezing or shortness of breath.     . Alcohol Swabs (B-D SINGLE USE SWABS REGULAR) PADS     . ANORO ELLIPTA 62.5-25 MCG/INH AEPB   11  . aspirin 81 MG tablet     . azithromycin (ZITHROMAX) 500 MG tablet Take 1 tablet (500 mg total) by mouth daily. 5 tablet 0  . benzonatate (TESSALON) 100 MG capsule TAKE 1 CAPSULE (100 MG TOTAL) BY MOUTH 3 (THREE) TIMES DAILY AS NEEDED FOR COUGH. 60 capsule 0  . beta carotene w/minerals (OCUVITE) tablet Take 1 tablet by mouth 2 (two) times daily.    Marland Kitchen bismuth subsalicylate (KAOPECTATE) 262 MG/15ML suspension Take 30 mLs by mouth 3 (three) times daily as needed. 360 mL 0  . Blood Glucose Calibration (TRUETEST CONTROL LEVEL 1) LIQD     . [employer] TUSSIN DM 100-10 MG/5ML liquid TAKE 1 TEASPOONFUL (5 MLS) BY MOUTH EVERY 12 (TWELVE) HOURS. 237 mL 1  . dexamethasone (DECADRON) 4 MG tablet Take 1 tablet (4 mg total) by  mouth 2 (two) times daily. Take day before [place] chemotherapy. 10 tablet 0  . diphenoxylate-atropine (LOMOTIL) 2.5-0.025 MG per tablet Take 1 tablet by mouth 4 (four) times daily as needed for diarrhea or loose stools. 45 tablet 1  . glimepiride (AMARYL) 1 MG tablet     . glucose blood test strip     . [employer] (TRUEDRAW LANCING DEVICE) MISC     .  lidocaine-prilocaine (EMLA) cream     . magnesium oxide (MAG-OX) 400 MG tablet Take 400 mg by mouth daily.      . Melatonin 5 MG CAPS Take 1 capsule (5 mg total) by mouth at bedtime as needed.  0  . metFORMIN (GLUCOPHAGE) 500 MG tablet Take 500 mg by mouth daily. Take '1000mg'$  po in morning, take '500mg'$  po at bedtime    . metoprolol (LOPRESSOR) 50 MG tablet daily. 50 mg 2 (two) times daily.    . naproxen (NAPROSYN) 500 MG tablet     . omeprazole (PRILOSEC) 40 MG capsule Take 40 mg by mouth daily as needed. For acid reflux      . ondansetron (ZOFRAN) 8 MG tablet Take 1 tablet (8 mg total) by mouth 2 (two) times daily. Start the day after chemo for 3 days. Then take as needed for nausea or vomiting. 30 tablet 1  . oxyCODONE (ROXICODONE) 5 MG immediate release tablet Take 1 tablet (5 mg total) by mouth every 6 (six) hours as needed for severe pain. 30 tablet 0  . predniSONE (DELTASONE) 10 MG tablet Start '60mg'$  taper by '10mg'$  daily until finished 21 tablet 0  . simvastatin (ZOCOR) 40 MG tablet Take 40 mg by mouth at bedtime.      . sucralfate (CARAFATE) 1 G tablet Take 1 tablet (1 g total) by mouth 4 (four) times daily -  with meals and at bedtime. Dissolve in 3 to 4 tablespoons of warm water, swish and swallow 90 tablet 3  . SYMBICORT 160-4.5 MCG/ACT inhaler   12  . TRUEPLUS LANCETS 28G MISC      No current facility-administered medications for this visit.   Facility-Administered Medications Ordered in Other Visits  Medication Dose Route Frequency Provider Last Rate Last Dose  . 0.9 %  sodium chloride infusion   Intravenous Continuous Forest Gleason,  MD   Stopped at 12/11/14 1034  . lidocaine-prilocaine (EMLA) cream   Topical Once Evlyn Kanner, NP      . sodium chloride 0.9 % injection 10 mL  10 mL Intracatheter PRN Forest Gleason, MD   10 mL at 06/03/14 1106  . sodium chloride 0.9 % injection 10 mL  10 mL Intravenous PRN Forest Gleason, MD   10 mL at 07/30/14 0920  . sodium chloride 0.9 % injection 10 mL  10 mL Intracatheter PRN Forest Gleason, MD   10 mL at 12/11/14 0912    OBJECTIVE: Filed Vitals:   12/10/14 1129  BP: 118/69  Pulse: 87  Temp: 98.1 F (36.7 C)     Body mass index is 29.41 kg/(m^2).    ECOG FS:1 - Symptomatic but completely ambulatory  Physical Exam  Gen. status: Patient is somewhat depressed.  Alert oriented not in any acute distress.  Chest wall area swelling and puffiness around the right upper lobe chest wall area without any redness or tenderness.  It appears that catheter entering into internal jugular vein has been somewhat tight Lungs: Diminished air entry on both sides occasional rhonchi.  Cardiac:  Tachycardia Abdominal exam revealed normal bowel sounds. The abdomen was soft, non-tender, and without masses, organomegaly, or appreciable enlargement of the abdominal aorta.  psychiatric systems somewhat depressed.   Neurologically, the patient was awake, alert, and oriented to person, place and time. There were no obvious focal neurologic abnormalities.  musculoskeletal system within normal limit   all other   12  system is been examined.     CBC Latest Ref Rng 12/10/2014  11/17/2014 11/05/2014  WBC 3.8 - 10.6 K/uL 5.8 4.0 2.6(L)  Hemoglobin 13.0 - 18.0 g/dL 10.5(L) 10.9(L) 10.4(L)  Hematocrit 40.0 - 52.0 % 31.9(L) 33.5(L) 31.9(L)  Platelets 150 - 440 K/uL 200 134(L) 104(L)      ASSESSMENT: \ 1.  Carcinoma of lung left upper lobe.  Stage IIIa squamous cell carcinoma.  I Patient is somewhat depressed.  Swelling on the right chest wall area we will go in and get ultrasound done however suspicion for  thrombosis associated with port is very low.     acute bronchitis.  Patient was started on antibiotic with Zithromax and prednisone tapering dose   He  was advised to stop Decadron.    diabetes being managed by patient and family using sliding scale insulin if needed Ultrasound has been reviewed and there was no evidence of thrombosis associated with catheter. 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    Cancer of lung   Staging form: Lung, AJCC 7th Edition     Clinical: T2, N2, M0 - Unsigned   Forest Gleason, MD   12/11/2014 7:12 PM

## 2014-12-18 ENCOUNTER — Other Ambulatory Visit: Payer: Self-pay | Admitting: Oncology

## 2014-12-18 NOTE — Telephone Encounter (Signed)
Ivan Beasley called to report that he has thrush and needs a refill of his Diflucan

## 2014-12-18 NOTE — Telephone Encounter (Signed)
I have placed a call to see why he is requesting this med. I had to leave a message to call me back

## 2015-01-05 ENCOUNTER — Ambulatory Visit
Admission: RE | Admit: 2015-01-05 | Discharge: 2015-01-05 | Disposition: A | Discharge status: Home / Self Care | Payer: Medicare HMO | Source: Ambulatory Visit | Attending: Oncology | Admitting: Oncology

## 2015-01-05 DIAGNOSIS — K760 Fatty (change of) liver, not elsewhere classified: Secondary | ICD-10-CM | POA: Diagnosis not present

## 2015-01-05 DIAGNOSIS — R911 Solitary pulmonary nodule: Secondary | ICD-10-CM | POA: Insufficient documentation

## 2015-01-05 DIAGNOSIS — R590 Localized enlarged lymph nodes: Secondary | ICD-10-CM | POA: Insufficient documentation

## 2015-01-05 DIAGNOSIS — C3402 Malignant neoplasm of left main bronchus: Secondary | ICD-10-CM | POA: Diagnosis not present

## 2015-01-05 HISTORY — DX: Malignant (primary) neoplasm, unspecified: C80.1

## 2015-01-05 MED ORDER — IOHEXOL 300 MG/ML  SOLN
75.0000 mL | Freq: Once | INTRAMUSCULAR | Status: AC | PRN
  Administered 2015-01-05: 75 mL via INTRAVENOUS

## 2015-01-12 ENCOUNTER — Inpatient Hospital Stay (HOSPITAL_BASED_OUTPATIENT_CLINIC_OR_DEPARTMENT_OTHER): Payer: Medicare HMO | Admitting: Oncology

## 2015-01-12 ENCOUNTER — Encounter: Payer: Self-pay | Admitting: Oncology

## 2015-01-12 ENCOUNTER — Inpatient Hospital Stay: Payer: Medicare HMO | Attending: Oncology

## 2015-01-12 VITALS — BP 125/73 | HR 74 | Temp 97.7°F | Wt 224.9 lb

## 2015-01-12 DIAGNOSIS — M549 Dorsalgia, unspecified: Secondary | ICD-10-CM | POA: Insufficient documentation

## 2015-01-12 DIAGNOSIS — Z5112 Encounter for antineoplastic immunotherapy: Secondary | ICD-10-CM | POA: Diagnosis present

## 2015-01-12 DIAGNOSIS — C3412 Malignant neoplasm of upper lobe, left bronchus or lung: Secondary | ICD-10-CM | POA: Diagnosis not present

## 2015-01-12 DIAGNOSIS — R000 Tachycardia, unspecified: Secondary | ICD-10-CM | POA: Diagnosis not present

## 2015-01-12 DIAGNOSIS — R509 Fever, unspecified: Secondary | ICD-10-CM | POA: Insufficient documentation

## 2015-01-12 DIAGNOSIS — Z79899 Other long term (current) drug therapy: Secondary | ICD-10-CM | POA: Insufficient documentation

## 2015-01-12 DIAGNOSIS — I2510 Atherosclerotic heart disease of native coronary artery without angina pectoris: Secondary | ICD-10-CM | POA: Diagnosis not present

## 2015-01-12 DIAGNOSIS — Z792 Long term (current) use of antibiotics: Secondary | ICD-10-CM | POA: Insufficient documentation

## 2015-01-12 DIAGNOSIS — M1990 Unspecified osteoarthritis, unspecified site: Secondary | ICD-10-CM | POA: Diagnosis not present

## 2015-01-12 DIAGNOSIS — R0609 Other forms of dyspnea: Secondary | ICD-10-CM

## 2015-01-12 DIAGNOSIS — Z9221 Personal history of antineoplastic chemotherapy: Secondary | ICD-10-CM | POA: Diagnosis not present

## 2015-01-12 DIAGNOSIS — R05 Cough: Secondary | ICD-10-CM | POA: Insufficient documentation

## 2015-01-12 DIAGNOSIS — R5383 Other fatigue: Secondary | ICD-10-CM

## 2015-01-12 DIAGNOSIS — K219 Gastro-esophageal reflux disease without esophagitis: Secondary | ICD-10-CM | POA: Diagnosis not present

## 2015-01-12 DIAGNOSIS — C3402 Malignant neoplasm of left main bronchus: Secondary | ICD-10-CM

## 2015-01-12 DIAGNOSIS — R093 Abnormal sputum: Secondary | ICD-10-CM

## 2015-01-12 DIAGNOSIS — Z951 Presence of aortocoronary bypass graft: Secondary | ICD-10-CM | POA: Insufficient documentation

## 2015-01-12 DIAGNOSIS — Z87891 Personal history of nicotine dependence: Secondary | ICD-10-CM | POA: Diagnosis not present

## 2015-01-12 DIAGNOSIS — D759 Disease of blood and blood-forming organs, unspecified: Secondary | ICD-10-CM | POA: Insufficient documentation

## 2015-01-12 DIAGNOSIS — R590 Localized enlarged lymph nodes: Secondary | ICD-10-CM | POA: Insufficient documentation

## 2015-01-12 DIAGNOSIS — Z7984 Long term (current) use of oral hypoglycemic drugs: Secondary | ICD-10-CM | POA: Diagnosis not present

## 2015-01-12 DIAGNOSIS — Z7982 Long term (current) use of aspirin: Secondary | ICD-10-CM | POA: Insufficient documentation

## 2015-01-12 DIAGNOSIS — Z923 Personal history of irradiation: Secondary | ICD-10-CM | POA: Diagnosis not present

## 2015-01-12 DIAGNOSIS — G8929 Other chronic pain: Secondary | ICD-10-CM | POA: Diagnosis not present

## 2015-01-12 DIAGNOSIS — E119 Type 2 diabetes mellitus without complications: Secondary | ICD-10-CM | POA: Diagnosis not present

## 2015-01-12 DIAGNOSIS — R531 Weakness: Secondary | ICD-10-CM | POA: Insufficient documentation

## 2015-01-12 DIAGNOSIS — F329 Major depressive disorder, single episode, unspecified: Secondary | ICD-10-CM

## 2015-01-12 LAB — CBC WITH DIFFERENTIAL/PLATELET
BASOS ABS: 0 10*3/uL (ref 0–0.1)
BASOS PCT: 0 %
Eosinophils Absolute: 0.3 10*3/uL (ref 0–0.7)
Eosinophils Relative: 4 %
HCT: 31.6 % — ABNORMAL LOW (ref 40.0–52.0)
HEMOGLOBIN: 10.4 g/dL — AB (ref 13.0–18.0)
LYMPHS PCT: 12 %
Lymphs Abs: 0.8 10*3/uL — ABNORMAL LOW (ref 1.0–3.6)
MCH: 29.6 pg (ref 26.0–34.0)
MCHC: 32.8 g/dL (ref 32.0–36.0)
MCV: 90 fL (ref 80.0–100.0)
Monocytes Absolute: 0.8 10*3/uL (ref 0.2–1.0)
Monocytes Relative: 12 %
Neutro Abs: 4.9 10*3/uL (ref 1.4–6.5)
Neutrophils Relative %: 72 %
Platelets: 209 10*3/uL (ref 150–440)
RBC: 3.51 MIL/uL — AB (ref 4.40–5.90)
RDW: 17.1 % — ABNORMAL HIGH (ref 11.5–14.5)
WBC: 6.8 10*3/uL (ref 3.8–10.6)

## 2015-01-12 LAB — COMPREHENSIVE METABOLIC PANEL
ALBUMIN: 3.8 g/dL (ref 3.5–5.0)
ALK PHOS: 90 U/L (ref 38–126)
ALT: 19 U/L (ref 17–63)
AST: 23 U/L (ref 15–41)
Anion gap: 7 (ref 5–15)
BILIRUBIN TOTAL: 0.5 mg/dL (ref 0.3–1.2)
BUN: 23 mg/dL — AB (ref 6–20)
CALCIUM: 9.3 mg/dL (ref 8.9–10.3)
CO2: 25 mmol/L (ref 22–32)
CREATININE: 1.36 mg/dL — AB (ref 0.61–1.24)
Chloride: 103 mmol/L (ref 101–111)
GFR calc Af Amer: 57 mL/min — ABNORMAL LOW (ref >60)
GFR calc non Af Amer: 49 mL/min — ABNORMAL LOW (ref >60)
GLUCOSE: 161 mg/dL — AB (ref 65–99)
POTASSIUM: 5.3 mmol/L — AB (ref 3.5–5.1)
Sodium: 135 mmol/L (ref 135–145)
TOTAL PROTEIN: 7.4 g/dL (ref 6.5–8.1)

## 2015-01-12 LAB — MAGNESIUM: Magnesium: 1.6 mg/dL — ABNORMAL LOW (ref 1.7–2.4)

## 2015-01-12 MED ORDER — BENZONATATE 100 MG PO CAPS: ORAL_CAPSULE | ORAL | Status: AC

## 2015-01-12 MED ORDER — HYDROCOD POLST-CPM POLST ER 10-8 MG/5ML PO SUER: 5.0000 mL | Freq: Two times a day (BID) | ORAL | Status: DC | PRN

## 2015-01-12 MED ORDER — AZITHROMYCIN 500 MG PO TABS: 500.0000 mg | ORAL_TABLET | Freq: Every day | ORAL | Status: DC

## 2015-01-12 NOTE — Progress Notes (Signed)
[place] @ [facility] Telephone:(336) [phone]  Fax:(336) [place]: 11/19/1940  MR#: [id number]  JOA#:[id number]  Patient Care Team: Albina Billet, MD as PCP - General (Internal Medicine)  CHIEF COMPLAINT:  Chief Complaint  Patient presents with  . Lung Cancer   Oncology History   75 year old gentleman with left hilar mass and mediastinal adenopathy Chronic smoker quit smoking in 2000 Non-insulin-dependent diabetes History of coronary artery disease with status post bypass surgery done in year 2000 1.  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  Patient is starting carboplatinum and Taxol with AUC of 2 and 45 mg/m respectively concurrent with radiation therapy (May 27, 2014) 3.  He has finished total 6 cycles of carboplatinum and Taxol weekly chemotherapy on July 02, 2014    4.  Patient had a PET scan done which shows partial response.  (September, 2016 Patient was start consolidation therapy from September 2 016 Day 8 of  treatment of second cycle has been discontinued because of progressive side effect in October of 2016. 5.  CT scan revealed slight enlargement of the left hilar nodule as well as gastrohepatic ligament adenopathy new from the April of 2016 (December, 2016)      INTERVAL HISTORY: Patient had myelosuppression and low-grade fever.  All the cultures were done and patient was started on IV antibiotics.  Feeling somewhat stronger.   Patient continues to feel weak and tired.  Has cough.  Yellowish expectoration.  Low-grade fever off and on.  Patient has noticed some swelling around the port site.  Somewhat depressed here for further follow-up regarding carcinoma of lung   Patient is here for ongoing evaluation and continues to have dry hacking cough. Shortness of breath on exertion.  Had a CT scan done here to discuss the results and further planning of  treatment Patient is here for further follow-up continues to have dry hacking cough. Feeling weak and tired. ROS  general status: Patient is feeling weak and tired.  No change in a performance status.  No chills.  No fever. HEENT: .  No evidence of stomatitis Lungs: Dry hacking cough with yellowish expectoration and low-grade fever Patient has noticed some swelling in the right chest wall area around the port site Cardiac: No chest pain or paroxysmal nocturnal dyspnea GI: Complains of diarrhea which is resolved Skin: No rash Lower extremity no swelling Neurological system: No tingling.  No numbness.  No other focal signs Musculoskeletal system no bony pains  As per HPI. Otherwise, a complete review of systems is negatve.  PAST MEDICAL HISTORY: Past Medical History  Diagnosis Date  . Coronary artery disease   . Pneumonia 2000  . Arthritis   . 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 Bruises easily   . GERD (gastroesophageal reflux disease)     takes Omeprazole daily as needed for stomach pain  . Blood transfusion 2001  . Diabetes mellitus     takes Metformin daily;  . Impaired hearing     bil hearing aide  . Macular degeneration     being watched for this but hasn't been "completely" diagnosed  . Myocardial infarction ([place]) 2001  . Cancer [facility])     PAST SURGICAL HISTORY: Past Surgical History  Procedure Laterality Date  . Coronary artery bypass graft  2001  4 vessels  . Cardiac catheterization  2001  . Tonsillectomy      as a child  . Colonoscopy    . Cataract extraction      bilateral  . Lumbar laminectomy/decompression microdiscectomy  12/19/2010    Procedure: LUMBAR LAMINECTOMY/DECOMPRESSION MICRODISCECTOMY;  Surgeon: Elaina Hoops;  Location: [place] NEURO ORS;  Service: Neurosurgery;  Laterality: N/A;  Lumbar three-four, four-five decompressive lumbar laminectomy  . Joint replacement  right tkr  . Eye surgery      cataracts  . Back surgery      l4 5  .  Portacath placement N/A 05/20/2014    Procedure: INSERTION PORT-A-CATH;  Surgeon: Nestor Lewandowsky, MD;  Location: [facility] ORS;  Service: General;  Laterality: N/A;    FAMILY HISTORY Family History  Problem Relation Age of Onset  . Anesthesia problems Neg Hx   . Hypotension Neg Hx   . Malignant hyperthermia Neg Hx   . Pseudochol deficiency Neg Hx         ADVANCED DIRECTIVES: Patient does have advanced health care directive   HEALTH MAINTENANCE: Social History  Substance Use Topics  . Smoking status: Former Smoker -- 40 years    Types: Cigarettes    Quit date: 05/19/1999  . Smokeless tobacco: Never Used     Comment: 2001  . Alcohol Use: No     No Known Allergies  Current Outpatient Prescriptions  Medication Sig Dispense Refill  . albuterol (PROVENTIL HFA;VENTOLIN HFA) 108 (90 BASE) MCG/ACT inhaler Inhale 2 puffs into the lungs as needed for wheezing or shortness of breath.     . Alcohol Swabs (B-D SINGLE USE SWABS REGULAR) PADS     . ANORO ELLIPTA 62.5-25 MCG/INH AEPB   11  . aspirin 81 MG tablet     . benzonatate (TESSALON) 100 MG capsule TAKE 1 CAPSULE (100 MG TOTAL) BY MOUTH 3 (THREE) TIMES DAILY AS NEEDED FOR COUGH. 90 capsule 3  . beta carotene w/minerals (OCUVITE) tablet Take 1 tablet by mouth 2 (two) times daily.    Marland Kitchen bismuth subsalicylate (KAOPECTATE) 262 MG/15ML suspension Take 30 mLs by mouth 3 (three) times daily as needed. 360 mL 0  . Blood Glucose Calibration (TRUETEST CONTROL LEVEL 1) LIQD     . diphenoxylate-atropine (LOMOTIL) 2.5-0.025 MG per tablet Take 1 tablet by mouth 4 (four) times daily as needed for diarrhea or loose stools. 45 tablet 1  . glimepiride (AMARYL) 1 MG tablet     . glucose blood test strip     . Lancet Devices (TRUEDRAW LANCING DEVICE) MISC     . lidocaine-prilocaine (EMLA) cream     . magnesium oxide (MAG-OX) 400 MG tablet Take 400 mg by mouth daily.      . Melatonin 5 MG CAPS Take 1 capsule (5 mg total) by mouth at bedtime as needed.  0   . metFORMIN (GLUCOPHAGE) 500 MG tablet Take 500 mg by mouth daily. Take '1000mg'$  po in morning, take '500mg'$  po at bedtime    . metoprolol (LOPRESSOR) 50 MG tablet daily. 50 mg 2 (two) times daily.    . naproxen (NAPROSYN) 500 MG tablet     . omeprazole (PRILOSEC) 40 MG capsule Take 40 mg by mouth daily as needed. For acid reflux      . ondansetron (ZOFRAN) 8 MG tablet Take 1 tablet (8 mg total) by mouth 2 (two) times daily. Start the day after chemo for 3 days. Then take as needed for nausea or vomiting. 30 tablet 1  .  oxyCODONE (ROXICODONE) 5 MG immediate release tablet Take 1 tablet (5 mg total) by mouth every 6 (six) hours as needed for severe pain. 30 tablet 0  . simvastatin (ZOCOR) 40 MG tablet Take 40 mg by mouth at bedtime.      . SYMBICORT 160-4.5 MCG/ACT inhaler   12  . TRUEPLUS LANCETS 28G MISC     . azithromycin (ZITHROMAX) 500 MG tablet Take 1 tablet (500 mg total) by mouth daily. 5 tablet 0  . chlorpheniramine-HYDROcodone (TUSSIONEX PENNKINETIC ER) 10-8 MG/5ML SUER Take 5 mLs by mouth every 12 (twelve) hours as needed for cough. 140 mL 0   No current facility-administered medications for this visit.   Facility-Administered Medications Ordered in Other Visits  Medication Dose Route Frequency Provider Last Rate Last Dose  . 0.9 %  sodium chloride infusion   Intravenous Continuous Forest Gleason, MD   Stopped at 12/11/14 1034  . lidocaine-prilocaine (EMLA) cream   Topical Once Evlyn Kanner, NP      . sodium chloride 0.9 % injection 10 mL  10 mL Intracatheter PRN Forest Gleason, MD   10 mL at 06/03/14 1106  . sodium chloride 0.9 % injection 10 mL  10 mL Intravenous PRN Forest Gleason, MD   10 mL at 07/30/14 0920  . sodium chloride 0.9 % injection 10 mL  10 mL Intracatheter PRN Forest Gleason, MD   10 mL at 12/11/14 0912    OBJECTIVE: Filed Vitals:   01/12/15 1351  BP: 125/73  Pulse: 74  Temp: 97.7 F (36.5 C)     Body mass index is 29.67 kg/(m^2).    ECOG FS:1 - Symptomatic but  completely ambulatory  Physical Exam  Gen. status: Patient is somewhat depressed.  Alert oriented not in any acute distress.  Chest wall area swelling and puffiness around the right upper lobe chest wall area without any redness or tenderness.  It appears that catheter entering into internal jugular vein has been somewhat tight Lungs: Diminished air entry on both sides occasional rhonchi.  Cardiac:  Tachycardia Abdominal exam revealed normal bowel sounds. The abdomen was soft, non-tender, and without masses, organomegaly, or appreciable enlargement of the abdominal aorta.  psychiatric systems somewhat depressed.   Neurologically, the patient was awake, alert, and oriented to person, place and time. There were no obvious focal neurologic abnormalities.  musculoskeletal system within normal limit   all other   12  system is been examined.     CBC Latest Ref Rng 01/12/2015 12/10/2014 11/17/2014  WBC 3.8 - 10.6 K/uL 6.8 5.8 4.0  Hemoglobin 13.0 - 18.0 g/dL 10.4(L) 10.5(L) 10.9(L)  Hematocrit 40.0 - 52.0 % 31.6(L) 31.9(L) 33.5(L)  Platelets 150 - 440 K/uL 209 200 134(L)      ASSESSMENT: \ 1.  Carcinoma of lung left upper lobe.  Stage IIIa squamous cell carcinoma.  I CT scan shows progressive disease.  Patient continues to have symptoms of cough and shortness of breath. All lab data has been reviewed  Possibly observation versus anti-PDL drug.  It is of available squamous cell carcinoma protocol if we have enough tissue for further analysis. CT scan has been reviewed independently and reviewed with the patient. Total duration of visit was 25  minutes.  50% or more time was spent in counseling patient and family regarding prognosis and options of treatment and available resources   Cancer of lung   Staging form: Lung, AJCC 7th Edition     Clinical: T2, N2, M0 - Unsigned  Forest Gleason, MD   01/12/2015 2:19 PM

## 2015-01-13 ENCOUNTER — Ambulatory Visit
Admission: RE | Admit: 2015-01-13 | Discharge: 2015-01-13 | Disposition: A | Discharge status: Home / Self Care | Payer: Medicare HMO | Source: Ambulatory Visit | Attending: Radiation Oncology | Admitting: Radiation Oncology

## 2015-01-13 ENCOUNTER — Encounter: Payer: Self-pay | Admitting: Radiation Oncology

## 2015-01-13 ENCOUNTER — Telehealth: Payer: Self-pay | Admitting: *Deleted

## 2015-01-13 VITALS — BP 113/64 | HR 77 | Resp 21 | Wt 224.0 lb

## 2015-01-13 DIAGNOSIS — C3402 Malignant neoplasm of left main bronchus: Secondary | ICD-10-CM

## 2015-01-13 NOTE — Progress Notes (Signed)
Radiation Oncology Follow up Note  Name: Ivan Beasley   Date:   01/13/2015 MRN:  [id number] DOB: [birth year]    This 75 y.o. male presents to the clinic today for follow-up for lung cancer stage IIIa (T2 N2 M0 squamous cell carcinoma the left lung.  REFERRING PROVIDER: Albina Billet, MD  HPI: Patient is a 75 year old male now 5 months out of concurrent chemoradiation for a stage IIIa (T2 N2 M0), cell carcinoma the left lung.Marland Kitchen He is seen today in routine follow-up. He had carboplatinum and Taxol with concurrent radiation completed back in May 2016. He is doing fairly well. Recently had a repeat CT scan shows excellent result of the left lung mass although some mild incremental increase in size of mediastinal lymph nodes. Medical oncology is planning immunotherapy at this time with possible investigational trial. Patient does have a a clear cough is on [employer] and has recently been on antibiotic therapy. He specifically denies marked increased shortness of breath dyspnea on exertion or hemoptysis.  COMPLICATIONS OF TREATMENT: none  FOLLOW UP COMPLIANCE: keeps appointments   PHYSICAL EXAM:  BP 113/64 mmHg  Pulse 77  Resp 21  Wt 223 lb 15.8 oz (101.6 kg) Well-developed well-nourished patient in NAD. HEENT reveals PERLA, EOMI, discs not visualized.  Oral cavity is clear. No oral mucosal lesions are identified. Neck is clear without evidence of cervical or supraclavicular adenopathy. Lungs are clear to A&P. Cardiac examination is essentially unremarkable with regular rate and rhythm without murmur rub or thrill. Abdomen is benign with no organomegaly or masses noted. Motor sensory and DTR levels are equal and symmetric in the upper and lower extremities. Cranial nerves II through XII are grossly intact. Proprioception is intact. No peripheral adenopathy or edema is identified. No motor or sensory levels are noted. Crude visual fields are within normal range.  RADIOLOGY RESULTS: CT scans  are reviewed and compared to prior studies  PLAN: Present time he has slight incremental increase in his mediastinal lymph nodes. I agree with investigational study with immunotherapy for this patient and have gone over rationale for that type of treatment. He is being followed by medical oncology and they are inquiring about availability of immunotherapy type treatment as he is seen medical oncology earlier this week. Otherwise I have asked to see him back in 6 months for follow-up. We happy to reevaluate this patient any time should palliative treatment be indicated.  I would like to take this opportunity for allowing me to participate in the care of your patient.Armstead Peaks., MD

## 2015-01-13 NOTE — Telephone Encounter (Signed)
Called patient's spouse to let her know patient's magnesium yesterday 01-11-14 was 1.6.  At this time MD will monitor the labs as no current medication is needed at this time.

## 2015-01-18 ENCOUNTER — Encounter: Payer: Self-pay | Admitting: Oncology

## 2015-01-19 ENCOUNTER — Telehealth: Payer: Self-pay | Admitting: *Deleted

## 2015-01-19 MED ORDER — FLUCONAZOLE 100 MG PO TABS: ORAL_TABLET | ORAL | Status: DC

## 2015-01-19 NOTE — Telephone Encounter (Signed)
Escribed

## 2015-01-27 ENCOUNTER — Other Ambulatory Visit: Payer: Self-pay | Admitting: Oncology

## 2015-01-27 ENCOUNTER — Telehealth: Payer: Self-pay | Admitting: *Deleted

## 2015-01-27 DIAGNOSIS — Z5111 Encounter for antineoplastic chemotherapy: Secondary | ICD-10-CM

## 2015-01-27 NOTE — Telephone Encounter (Signed)
Dr Jeb Levering will enter orders in computer

## 2015-01-27 NOTE — Telephone Encounter (Signed)
Asking when he is to be scheduled for Immunotherapy. She got a letter from her insurance that he is approved for it.

## 2015-02-03 ENCOUNTER — Inpatient Hospital Stay: Payer: Medicare HMO

## 2015-02-03 ENCOUNTER — Inpatient Hospital Stay (HOSPITAL_BASED_OUTPATIENT_CLINIC_OR_DEPARTMENT_OTHER): Payer: Medicare HMO | Admitting: Oncology

## 2015-02-03 ENCOUNTER — Encounter: Payer: Self-pay | Admitting: Oncology

## 2015-02-03 VITALS — BP 116/66 | HR 97 | Temp 98.2°F | Resp 18 | Wt 219.4 lb

## 2015-02-03 DIAGNOSIS — R05 Cough: Secondary | ICD-10-CM

## 2015-02-03 DIAGNOSIS — C3412 Malignant neoplasm of upper lobe, left bronchus or lung: Secondary | ICD-10-CM

## 2015-02-03 DIAGNOSIS — Z5111 Encounter for antineoplastic chemotherapy: Secondary | ICD-10-CM

## 2015-02-03 DIAGNOSIS — R509 Fever, unspecified: Secondary | ICD-10-CM

## 2015-02-03 DIAGNOSIS — Z87891 Personal history of nicotine dependence: Secondary | ICD-10-CM

## 2015-02-03 DIAGNOSIS — R531 Weakness: Secondary | ICD-10-CM

## 2015-02-03 DIAGNOSIS — R590 Localized enlarged lymph nodes: Secondary | ICD-10-CM

## 2015-02-03 DIAGNOSIS — Z792 Long term (current) use of antibiotics: Secondary | ICD-10-CM

## 2015-02-03 DIAGNOSIS — D759 Disease of blood and blood-forming organs, unspecified: Secondary | ICD-10-CM

## 2015-02-03 DIAGNOSIS — R5383 Other fatigue: Secondary | ICD-10-CM

## 2015-02-03 DIAGNOSIS — R093 Abnormal sputum: Secondary | ICD-10-CM

## 2015-02-03 DIAGNOSIS — Z5112 Encounter for antineoplastic immunotherapy: Secondary | ICD-10-CM | POA: Diagnosis not present

## 2015-02-03 DIAGNOSIS — C3402 Malignant neoplasm of left main bronchus: Secondary | ICD-10-CM

## 2015-02-03 DIAGNOSIS — R0609 Other forms of dyspnea: Secondary | ICD-10-CM

## 2015-02-03 LAB — CBC WITH DIFFERENTIAL/PLATELET
Basophils Absolute: 0 10*3/uL (ref 0–0.1)
Basophils Relative: 1 %
EOS PCT: 4 %
Eosinophils Absolute: 0.3 10*3/uL (ref 0–0.7)
HCT: 32.8 % — ABNORMAL LOW (ref 40.0–52.0)
Hemoglobin: 11.1 g/dL — ABNORMAL LOW (ref 13.0–18.0)
LYMPHS ABS: 0.7 10*3/uL — AB (ref 1.0–3.6)
LYMPHS PCT: 12 %
MCH: 30.2 pg (ref 26.0–34.0)
MCHC: 33.8 g/dL (ref 32.0–36.0)
MCV: 89.4 fL (ref 80.0–100.0)
MONO ABS: 0.6 10*3/uL (ref 0.2–1.0)
Monocytes Relative: 10 %
Neutro Abs: 4.6 10*3/uL (ref 1.4–6.5)
Neutrophils Relative %: 73 %
PLATELETS: 181 10*3/uL (ref 150–440)
RBC: 3.67 MIL/uL — ABNORMAL LOW (ref 4.40–5.90)
RDW: 16 % — AB (ref 11.5–14.5)
WBC: 6.2 10*3/uL (ref 3.8–10.6)

## 2015-02-03 LAB — COMPREHENSIVE METABOLIC PANEL
ALT: 24 U/L (ref 17–63)
ANION GAP: 7 (ref 5–15)
AST: 30 U/L (ref 15–41)
Albumin: 3.9 g/dL (ref 3.5–5.0)
Alkaline Phosphatase: 94 U/L (ref 38–126)
BUN: 27 mg/dL — ABNORMAL HIGH (ref 6–20)
CHLORIDE: 105 mmol/L (ref 101–111)
CO2: 21 mmol/L — ABNORMAL LOW (ref 22–32)
Calcium: 9.4 mg/dL (ref 8.9–10.3)
Creatinine, Ser: 1.24 mg/dL (ref 0.61–1.24)
GFR, EST NON AFRICAN AMERICAN: 55 mL/min — AB (ref >60)
Glucose, Bld: 288 mg/dL — ABNORMAL HIGH (ref 65–99)
POTASSIUM: 4.6 mmol/L (ref 3.5–5.1)
Sodium: 133 mmol/L — ABNORMAL LOW (ref 135–145)
Total Bilirubin: 0.4 mg/dL (ref 0.3–1.2)
Total Protein: 7.7 g/dL (ref 6.5–8.1)

## 2015-02-03 MED ORDER — HEPARIN SOD (PORK) LOCK FLUSH 100 UNIT/ML IV SOLN
500.0000 [IU] | Freq: Once | INTRAVENOUS | Status: AC
  Administered 2015-02-03: 500 [IU] via INTRAVENOUS
  Filled 2015-02-03: qty 5

## 2015-02-03 MED ORDER — SODIUM CHLORIDE 0.9 % IJ SOLN
10.0000 mL | Freq: Once | INTRAMUSCULAR | Status: AC
  Administered 2015-02-03: 10 mL via INTRAVENOUS
  Filled 2015-02-03: qty 10

## 2015-02-03 MED ORDER — SODIUM CHLORIDE 0.9 % IV SOLN
240.0000 mg | Freq: Once | INTRAVENOUS | Status: AC
  Administered 2015-02-03: 240 mg via INTRAVENOUS
  Filled 2015-02-03: qty 8

## 2015-02-03 MED ORDER — SODIUM CHLORIDE 0.9 % IV SOLN
Freq: Once | INTRAVENOUS | Status: AC
  Administered 2015-02-03: 10:00:00 via INTRAVENOUS
  Filled 2015-02-03: qty 1000

## 2015-02-03 NOTE — Progress Notes (Signed)
[place] @ [facility] [phone]) [phone]  Fax:(336) [place]: 10/22/1940  MR#: [id number]  CXK#:[id number]  Patient Care Team: Albina Billet, MD as PCP - General (Internal Medicine)  CHIEF COMPLAINT:  Chief Complaint  Patient presents with  . Lung Cancer   Oncology History   75 year old gentleman with left hilar mass and mediastinal adenopathy Chronic smoker quit smoking in 2000 Non-insulin-dependent diabetes History of coronary artery disease with status post bypass surgery done in year 2000 1.  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  Patient is starting carboplatinum and Taxol with AUC of 2 and 45 mg/m respectively concurrent with radiation therapy (May 27, 2014) 3.  He has finished total 6 cycles of carboplatinum and Taxol weekly chemotherapy on July 02, 2014    4.  Patient had a PET scan done which shows partial response.  (September, 2016 Patient was start consolidation therapy from September 2 016 Day 8 of  treatment of second cycle has been discontinued because of progressive side effect in October of 2016. 5.  CT scan revealed slight enlargement of the left hilar nodule as well as gastrohepatic ligament adenopathy new from the April of 2016 (December, 2016)  6.  Patient has been started on   nivolulamab from February 03, 2015  INTERVAL HISTORY: Patient had myelosuppression and low-grade fever.  All the cultures were done and patient was started on IV antibiotics.  Feeling somewhat stronger.   Patient continues to feel weak and tired.  Has cough.  Yellowish expectoration.  Low-grade fever off and on.  Patient has noticed some swelling around the port site.  Somewhat depressed here for further follow-up regarding carcinoma of lung   Patient is here for ongoing evaluation and continues to have dry hacking cough. Shortness of breath on exertion.  Had a CT scan done  here to discuss the results and further planning of treatment Patient is here for further follow-up continues to have dry hacking cough. Feeling weak and tired.  Patient continues to have dry hacking cough which is improved.  No nausea no vomiting no diarrhea appetite has been stable. ROS  general status: Patient is feeling weak and tired.  No change in a performance status.  No chills.  No fever. HEENT: .  No evidence of stomatitis Lungs: Dry hacking cough with yellowish expectoration and low-grade fever Patient has noticed some swelling in the right chest wall area around the port site Cardiac: No chest pain or paroxysmal nocturnal dyspnea GI: Complains of diarrhea which is resolved Skin: No rash Lower extremity no swelling Neurological system: No tingling.  No numbness.  No other focal signs Musculoskeletal system no bony pains  As per HPI. Otherwise, a complete review of systems is negatve.  PAST MEDICAL HISTORY: Past Medical History  Diagnosis Date  . Coronary artery disease   . Pneumonia 2000  . Arthritis   . 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 Bruises easily   . GERD (gastroesophageal reflux disease)     takes Omeprazole daily as needed for stomach pain  . Blood transfusion 2001  . Diabetes mellitus     takes Metformin daily;  . Impaired hearing     bil hearing aide  . Macular degeneration     being watched for this but hasn't been "completely" diagnosed  .  Myocardial infarction ([place]) 2001  . Cancer [facility])     PAST SURGICAL HISTORY: Past Surgical History  Procedure Laterality Date  . 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 Cardiac catheterization  2001  . Tonsillectomy      as a child  . Colonoscopy    . Cataract extraction      bilateral  . Lumbar laminectomy/decompression microdiscectomy  12/19/2010    Procedure: LUMBAR LAMINECTOMY/DECOMPRESSION MICRODISCECTOMY;  Surgeon: Elaina Hoops;  Location: [place] NEURO ORS;  Service: Neurosurgery;   Laterality: N/A;  Lumbar three-four, four-five decompressive lumbar laminectomy  . Joint replacement  right tkr  . Eye surgery      cataracts  . Back surgery      l4 5  . Portacath placement N/A 05/20/2014    Procedure: INSERTION PORT-A-CATH;  Surgeon: Nestor Lewandowsky, MD;  Location: [facility] ORS;  Service: General;  Laterality: N/A;    FAMILY HISTORY Family History  Problem Relation Age of Onset  . Anesthesia problems Neg Hx   . Hypotension Neg Hx   . Malignant hyperthermia Neg Hx   . Pseudochol deficiency Neg Hx         ADVANCED DIRECTIVES: Patient does have advanced health care directive   HEALTH MAINTENANCE: Social History  Substance Use Topics  . Smoking status: Former Smoker -- 40 years    Types: Cigarettes    Quit date: 05/19/1999  . Smokeless tobacco: Never Used     Comment: 2001  . Alcohol Use: No     No Known Allergies  Current Outpatient Prescriptions  Medication Sig Dispense Refill  . albuterol (PROVENTIL HFA;VENTOLIN HFA) 108 (90 BASE) MCG/ACT inhaler Inhale 2 puffs into the lungs as needed for wheezing or shortness of breath.     . Alcohol Swabs (B-D SINGLE USE SWABS REGULAR) PADS     . ANORO ELLIPTA 62.5-25 MCG/INH AEPB   11  . aspirin 81 MG tablet     . azithromycin (ZITHROMAX) 500 MG tablet Take 1 tablet (500 mg total) by mouth daily. 5 tablet 0  . benzonatate (TESSALON) 100 MG capsule TAKE 1 CAPSULE (100 MG TOTAL) BY MOUTH 3 (THREE) TIMES DAILY AS NEEDED FOR COUGH. 90 capsule 3  . beta carotene w/minerals (OCUVITE) tablet Take 1 tablet by mouth 2 (two) times daily.    Marland Kitchen bismuth subsalicylate (KAOPECTATE) 262 MG/15ML suspension Take 30 mLs by mouth 3 (three) times daily as needed. 360 mL 0  . Blood Glucose Calibration (TRUETEST CONTROL LEVEL 1) LIQD     . chlorpheniramine-HYDROcodone (TUSSIONEX PENNKINETIC ER) 10-8 MG/5ML SUER Take 5 mLs by mouth every 12 (twelve) hours as needed for cough. 140 mL 0  . diphenoxylate-atropine (LOMOTIL) 2.5-0.025 MG per  tablet Take 1 tablet by mouth 4 (four) times daily as needed for diarrhea or loose stools. 45 tablet 1  . fluconazole (DIFLUCAN) 100 MG tablet TAKE 1 TABLET (100 MG TOTAL) BY MOUTH DAILY. 5 tablet 0  . glimepiride (AMARYL) 1 MG tablet     . glucose blood test strip     . Lancet Devices (TRUEDRAW LANCING DEVICE) MISC     . lidocaine-prilocaine (EMLA) cream     . magnesium oxide (MAG-OX) 400 MG tablet Take 400 mg by mouth daily.      . Melatonin 5 MG CAPS Take 1 capsule (5 mg total) by mouth at bedtime as needed.  0  . metFORMIN (GLUCOPHAGE) 500 MG tablet Take 500 mg by mouth daily. Take '1000mg'$   po in morning, take '500mg'$  po at bedtime    . metoprolol (LOPRESSOR) 50 MG tablet daily. 50 mg 2 (two) times daily.    . naproxen (NAPROSYN) 500 MG tablet     . omeprazole (PRILOSEC) 40 MG capsule Take 40 mg by mouth daily as needed. For acid reflux      . ondansetron (ZOFRAN) 8 MG tablet Take 1 tablet (8 mg total) by mouth 2 (two) times daily. Start the day after chemo for 3 days. Then take as needed for nausea or vomiting. 30 tablet 1  . oxyCODONE (ROXICODONE) 5 MG immediate release tablet Take 1 tablet (5 mg total) by mouth every 6 (six) hours as needed for severe pain. 30 tablet 0  . simvastatin (ZOCOR) 40 MG tablet Take 40 mg by mouth at bedtime.      . SYMBICORT 160-4.5 MCG/ACT inhaler   12  . TRUEPLUS LANCETS 28G MISC      No current facility-administered medications for this visit.   Facility-Administered Medications Ordered in Other Visits  Medication Dose Route Frequency Provider Last Rate Last Dose  . 0.9 %  sodium chloride infusion   Intravenous Continuous Forest Gleason, MD   Stopped at 12/11/14 1034  . heparin lock flush 100 unit/mL  500 Units Intravenous Once Forest Gleason, MD      . lidocaine-prilocaine (EMLA) cream   Topical Once Evlyn Kanner, NP      . sodium chloride 0.9 % injection 10 mL  10 mL Intracatheter PRN Forest Gleason, MD   10 mL at 06/03/14 1106  . sodium chloride 0.9 %  injection 10 mL  10 mL Intravenous PRN Forest Gleason, MD   10 mL at 07/30/14 0920  . sodium chloride 0.9 % injection 10 mL  10 mL Intracatheter PRN Forest Gleason, MD   10 mL at 12/11/14 0912    OBJECTIVE: Filed Vitals:   02/03/15 0857  BP: 116/66  Pulse: 97  Temp: 98.2 F (36.8 C)  Resp: 18     Body mass index is 28.95 kg/(m^2).    ECOG FS:1 - Symptomatic but completely ambulatory  Physical Exam  Gen. status: Patient is somewhat depressed.  Alert oriented not in any acute distress.  Chest wall area swelling and puffiness around the right upper lobe chest wall area without any redness or tenderness.  It appears that catheter entering into internal jugular vein has been somewhat tight Lungs: Diminished air entry on both sides occasional rhonchi.  Cardiac:  Tachycardia Abdominal exam revealed normal bowel sounds. The abdomen was soft, non-tender, and without masses, organomegaly, or appreciable enlargement of the abdominal aorta.  psychiatric systems somewhat depressed.   Neurologically, the patient was awake, alert, and oriented to person, place and time. There were no obvious focal neurologic abnormalities.  musculoskeletal system within normal limit   all other   12  system is been examined.     CBC Latest Ref Rng 02/03/2015 01/12/2015 12/10/2014  WBC 3.8 - 10.6 K/uL 6.2 6.8 5.8  Hemoglobin 13.0 - 18.0 g/dL 11.1(L) 10.4(L) 10.5(L)  Hematocrit 40.0 - 52.0 % 32.8(L) 31.6(L) 31.9(L)  Platelets 150 - 440 K/uL 181 209 200      ASSESSMENT: \ 1.  Carcinoma of lung left upper lobe.  Stage IIIa squamous cell carcinoma.  I CT scan shows progressive disease.  Patient continues to have symptoms of cough and shortness of breath. All lab data has been reviewed Hypomagnesemia We will recheck magnesium level t and family regarding prognosis and  options of treatment and available resources Proceed with   novolulamab  Informal consent has been obtained Treatment will be repeated in 2 weeks IV  reevaluate patient in 4 weeks  Patient was explained all the side effects of chemotherapy.  And informed consent has been obtained Intent of chemotherapy is palliation and relief in symptoms and extending survival All questions regarding prognosis and chemotherapy has been explained  Cancer of lung   Staging form: Lung, AJCC 7th Edition     Clinical: T2, N2, M0 - Marni Griffon, MD   02/03/2015 9:10 AM

## 2015-02-17 ENCOUNTER — Inpatient Hospital Stay: Payer: Medicare HMO | Attending: Oncology

## 2015-02-17 ENCOUNTER — Inpatient Hospital Stay: Payer: Medicare HMO

## 2015-02-17 VITALS — BP 112/66 | HR 73 | Temp 96.9°F | Resp 20

## 2015-02-17 DIAGNOSIS — Z7982 Long term (current) use of aspirin: Secondary | ICD-10-CM | POA: Insufficient documentation

## 2015-02-17 DIAGNOSIS — Z79899 Other long term (current) drug therapy: Secondary | ICD-10-CM | POA: Diagnosis not present

## 2015-02-17 DIAGNOSIS — Z7984 Long term (current) use of oral hypoglycemic drugs: Secondary | ICD-10-CM | POA: Diagnosis not present

## 2015-02-17 DIAGNOSIS — R000 Tachycardia, unspecified: Secondary | ICD-10-CM | POA: Diagnosis not present

## 2015-02-17 DIAGNOSIS — M1990 Unspecified osteoarthritis, unspecified site: Secondary | ICD-10-CM | POA: Insufficient documentation

## 2015-02-17 DIAGNOSIS — R093 Abnormal sputum: Secondary | ICD-10-CM | POA: Diagnosis not present

## 2015-02-17 DIAGNOSIS — R5383 Other fatigue: Secondary | ICD-10-CM | POA: Diagnosis not present

## 2015-02-17 DIAGNOSIS — R509 Fever, unspecified: Secondary | ICD-10-CM | POA: Diagnosis not present

## 2015-02-17 DIAGNOSIS — G8929 Other chronic pain: Secondary | ICD-10-CM | POA: Insufficient documentation

## 2015-02-17 DIAGNOSIS — Z87891 Personal history of nicotine dependence: Secondary | ICD-10-CM | POA: Insufficient documentation

## 2015-02-17 DIAGNOSIS — I2510 Atherosclerotic heart disease of native coronary artery without angina pectoris: Secondary | ICD-10-CM | POA: Insufficient documentation

## 2015-02-17 DIAGNOSIS — C3402 Malignant neoplasm of left main bronchus: Secondary | ICD-10-CM

## 2015-02-17 DIAGNOSIS — E119 Type 2 diabetes mellitus without complications: Secondary | ICD-10-CM | POA: Insufficient documentation

## 2015-02-17 DIAGNOSIS — M549 Dorsalgia, unspecified: Secondary | ICD-10-CM | POA: Diagnosis not present

## 2015-02-17 DIAGNOSIS — R590 Localized enlarged lymph nodes: Secondary | ICD-10-CM | POA: Insufficient documentation

## 2015-02-17 DIAGNOSIS — R05 Cough: Secondary | ICD-10-CM | POA: Diagnosis not present

## 2015-02-17 DIAGNOSIS — I252 Old myocardial infarction: Secondary | ICD-10-CM | POA: Insufficient documentation

## 2015-02-17 DIAGNOSIS — Z5112 Encounter for antineoplastic immunotherapy: Secondary | ICD-10-CM | POA: Insufficient documentation

## 2015-02-17 DIAGNOSIS — R531 Weakness: Secondary | ICD-10-CM | POA: Insufficient documentation

## 2015-02-17 DIAGNOSIS — C3412 Malignant neoplasm of upper lobe, left bronchus or lung: Secondary | ICD-10-CM | POA: Diagnosis not present

## 2015-02-17 DIAGNOSIS — K219 Gastro-esophageal reflux disease without esophagitis: Secondary | ICD-10-CM | POA: Diagnosis not present

## 2015-02-17 LAB — COMPREHENSIVE METABOLIC PANEL
ALK PHOS: 142 U/L — AB (ref 38–126)
ALT: 31 U/L (ref 17–63)
ANION GAP: 10 (ref 5–15)
AST: 27 U/L (ref 15–41)
Albumin: 3.9 g/dL (ref 3.5–5.0)
BILIRUBIN TOTAL: 0.7 mg/dL (ref 0.3–1.2)
BUN: 29 mg/dL — ABNORMAL HIGH (ref 6–20)
CALCIUM: 9.5 mg/dL (ref 8.9–10.3)
CO2: 20 mmol/L — ABNORMAL LOW (ref 22–32)
Chloride: 104 mmol/L (ref 101–111)
Creatinine, Ser: 1.27 mg/dL — ABNORMAL HIGH (ref 0.61–1.24)
GFR calc Af Amer: >60 mL/min (ref >60)
GFR, EST NON AFRICAN AMERICAN: 54 mL/min — AB (ref >60)
Glucose, Bld: 235 mg/dL — ABNORMAL HIGH (ref 65–99)
POTASSIUM: 4.2 mmol/L (ref 3.5–5.1)
Sodium: 134 mmol/L — ABNORMAL LOW (ref 135–145)
TOTAL PROTEIN: 7.2 g/dL (ref 6.5–8.1)

## 2015-02-17 LAB — CBC WITH DIFFERENTIAL/PLATELET
BASOS ABS: 0 10*3/uL (ref 0–0.1)
BASOS PCT: 1 %
EOS ABS: 0.2 10*3/uL (ref 0–0.7)
EOS PCT: 4 %
HEMATOCRIT: 32.4 % — AB (ref 40.0–52.0)
HEMOGLOBIN: 10.7 g/dL — AB (ref 13.0–18.0)
Lymphocytes Relative: 11 %
Lymphs Abs: 0.7 10*3/uL — ABNORMAL LOW (ref 1.0–3.6)
MCH: 29.5 pg (ref 26.0–34.0)
MCHC: 33.1 g/dL (ref 32.0–36.0)
MCV: 89.1 fL (ref 80.0–100.0)
Monocytes Absolute: 0.6 10*3/uL (ref 0.2–1.0)
Monocytes Relative: 10 %
NEUTROS PCT: 74 %
Neutro Abs: 4.3 10*3/uL (ref 1.4–6.5)
Platelets: 153 10*3/uL (ref 150–440)
RBC: 3.64 MIL/uL — AB (ref 4.40–5.90)
RDW: 16 % — AB (ref 11.5–14.5)
WBC: 5.8 10*3/uL (ref 3.8–10.6)

## 2015-02-17 LAB — MAGNESIUM: MAGNESIUM: 1.9 mg/dL (ref 1.7–2.4)

## 2015-02-17 MED ORDER — SODIUM CHLORIDE 0.9 % IV SOLN
Freq: Once | INTRAVENOUS | Status: AC
  Administered 2015-02-17: 10:00:00 via INTRAVENOUS
  Filled 2015-02-17: qty 1000

## 2015-02-17 MED ORDER — SODIUM CHLORIDE 0.9 % IV SOLN
240.0000 mg | Freq: Once | INTRAVENOUS | Status: AC
  Administered 2015-02-17: 240 mg via INTRAVENOUS
  Filled 2015-02-17: qty 20

## 2015-02-17 MED ORDER — SODIUM CHLORIDE 0.9 % IJ SOLN
10.0000 mL | INTRAMUSCULAR | Status: DC | PRN
  Administered 2015-02-17: 10 mL
  Filled 2015-02-17: qty 10

## 2015-02-17 MED ORDER — HEPARIN SOD (PORK) LOCK FLUSH 100 UNIT/ML IV SOLN
500.0000 [IU] | Freq: Once | INTRAVENOUS | Status: AC | PRN
  Administered 2015-02-17: 500 [IU]
  Filled 2015-02-17: qty 5

## 2015-03-03 ENCOUNTER — Inpatient Hospital Stay: Payer: Medicare HMO

## 2015-03-03 ENCOUNTER — Inpatient Hospital Stay (HOSPITAL_BASED_OUTPATIENT_CLINIC_OR_DEPARTMENT_OTHER): Payer: Medicare HMO | Admitting: Oncology

## 2015-03-03 ENCOUNTER — Other Ambulatory Visit: Payer: Self-pay | Admitting: *Deleted

## 2015-03-03 ENCOUNTER — Encounter: Payer: Self-pay | Admitting: Oncology

## 2015-03-03 VITALS — BP 125/69 | HR 90 | Temp 96.9°F | Resp 18 | Wt 219.1 lb

## 2015-03-03 DIAGNOSIS — C3412 Malignant neoplasm of upper lobe, left bronchus or lung: Secondary | ICD-10-CM | POA: Diagnosis not present

## 2015-03-03 DIAGNOSIS — C3402 Malignant neoplasm of left main bronchus: Secondary | ICD-10-CM

## 2015-03-03 DIAGNOSIS — R590 Localized enlarged lymph nodes: Secondary | ICD-10-CM | POA: Diagnosis not present

## 2015-03-03 DIAGNOSIS — R093 Abnormal sputum: Secondary | ICD-10-CM

## 2015-03-03 DIAGNOSIS — Z5112 Encounter for antineoplastic immunotherapy: Secondary | ICD-10-CM | POA: Diagnosis not present

## 2015-03-03 DIAGNOSIS — R509 Fever, unspecified: Secondary | ICD-10-CM

## 2015-03-03 DIAGNOSIS — Z7984 Long term (current) use of oral hypoglycemic drugs: Secondary | ICD-10-CM

## 2015-03-03 DIAGNOSIS — R531 Weakness: Secondary | ICD-10-CM

## 2015-03-03 DIAGNOSIS — R5383 Other fatigue: Secondary | ICD-10-CM

## 2015-03-03 DIAGNOSIS — Z87891 Personal history of nicotine dependence: Secondary | ICD-10-CM | POA: Diagnosis not present

## 2015-03-03 DIAGNOSIS — R000 Tachycardia, unspecified: Secondary | ICD-10-CM

## 2015-03-03 DIAGNOSIS — Z7982 Long term (current) use of aspirin: Secondary | ICD-10-CM

## 2015-03-03 DIAGNOSIS — R05 Cough: Secondary | ICD-10-CM

## 2015-03-03 DIAGNOSIS — Z79899 Other long term (current) drug therapy: Secondary | ICD-10-CM

## 2015-03-03 LAB — CBC WITH DIFFERENTIAL/PLATELET
BASOS ABS: 0 10*3/uL (ref 0–0.1)
BASOS PCT: 0 %
Eosinophils Absolute: 0.2 10*3/uL (ref 0–0.7)
Eosinophils Relative: 2 %
HEMATOCRIT: 33.7 % — AB (ref 40.0–52.0)
Hemoglobin: 11.1 g/dL — ABNORMAL LOW (ref 13.0–18.0)
Lymphocytes Relative: 9 %
Lymphs Abs: 0.6 10*3/uL — ABNORMAL LOW (ref 1.0–3.6)
MCH: 29 pg (ref 26.0–34.0)
MCHC: 32.9 g/dL (ref 32.0–36.0)
MCV: 88.2 fL (ref 80.0–100.0)
MONO ABS: 0.6 10*3/uL (ref 0.2–1.0)
Monocytes Relative: 9 %
NEUTROS ABS: 5.5 10*3/uL (ref 1.4–6.5)
NEUTROS PCT: 80 %
Platelets: 167 10*3/uL (ref 150–440)
RBC: 3.83 MIL/uL — AB (ref 4.40–5.90)
RDW: 16.4 % — AB (ref 11.5–14.5)
WBC: 6.9 10*3/uL (ref 3.8–10.6)

## 2015-03-03 LAB — COMPREHENSIVE METABOLIC PANEL
ALK PHOS: 188 U/L — AB (ref 38–126)
ALT: 27 U/L (ref 17–63)
ANION GAP: 7 (ref 5–15)
AST: 33 U/L (ref 15–41)
Albumin: 3.9 g/dL (ref 3.5–5.0)
BILIRUBIN TOTAL: 0.6 mg/dL (ref 0.3–1.2)
BUN: 30 mg/dL — ABNORMAL HIGH (ref 6–20)
CALCIUM: 9.2 mg/dL (ref 8.9–10.3)
CO2: 20 mmol/L — ABNORMAL LOW (ref 22–32)
Chloride: 105 mmol/L (ref 101–111)
Creatinine, Ser: 1.24 mg/dL (ref 0.61–1.24)
GFR, EST NON AFRICAN AMERICAN: 55 mL/min — AB (ref >60)
Glucose, Bld: 273 mg/dL — ABNORMAL HIGH (ref 65–99)
Potassium: 4.4 mmol/L (ref 3.5–5.1)
SODIUM: 132 mmol/L — AB (ref 135–145)
TOTAL PROTEIN: 7.4 g/dL (ref 6.5–8.1)

## 2015-03-03 LAB — MAGNESIUM: Magnesium: 1.7 mg/dL (ref 1.7–2.4)

## 2015-03-03 MED ORDER — SODIUM CHLORIDE 0.9 % IJ SOLN
10.0000 mL | INTRAMUSCULAR | Status: DC | PRN
  Filled 2015-03-03: qty 10

## 2015-03-03 MED ORDER — FLUCONAZOLE 100 MG PO TABS: 100.0000 mg | ORAL_TABLET | Freq: Every day | ORAL | Status: DC

## 2015-03-03 MED ORDER — HEPARIN SOD (PORK) LOCK FLUSH 100 UNIT/ML IV SOLN
500.0000 [IU] | Freq: Once | INTRAVENOUS | Status: AC | PRN
  Administered 2015-03-03: 500 [IU]
  Filled 2015-03-03: qty 5

## 2015-03-03 MED ORDER — SODIUM CHLORIDE 0.9 % IV SOLN
Freq: Once | INTRAVENOUS | Status: AC
  Administered 2015-03-03: 11:00:00 via INTRAVENOUS
  Filled 2015-03-03: qty 1000

## 2015-03-03 MED ORDER — SODIUM CHLORIDE 0.9 % IV SOLN
1.0000 g | Freq: Once | INTRAVENOUS | Status: AC
  Administered 2015-03-03: 1 g via INTRAVENOUS
  Filled 2015-03-03: qty 2

## 2015-03-03 MED ORDER — SODIUM CHLORIDE 0.9 % IV SOLN
240.0000 mg | Freq: Once | INTRAVENOUS | Status: AC
  Administered 2015-03-03: 240 mg via INTRAVENOUS
  Filled 2015-03-03: qty 20

## 2015-03-03 NOTE — Progress Notes (Signed)
[place] @ [facility] Telephone:([phone]  Fax:(336) [place]: January 23, 1940  MR#: [id number]  BPZ#:[id number]  Patient Care Team: Albina Billet, MD as PCP - General (Internal Medicine)  CHIEF COMPLAINT:  Chief Complaint  Patient presents with  . Lung Cancer   Oncology History   75 year old gentleman with left hilar mass and mediastinal adenopathy Chronic smoker quit smoking in 2000 Non-insulin-dependent diabetes History of coronary artery disease with status post bypass surgery done in year 2000 1.  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  Patient is starting carboplatinum and Taxol with AUC of 2 and 45 mg/m respectively concurrent with radiation therapy (May 27, 2014) 3.  He has finished total 6 cycles of carboplatinum and Taxol weekly chemotherapy on July 02, 2014    4.  Patient had a PET scan done which shows partial response.  (September, 2016 Patient was start consolidation therapy from September 2 016 Day 8 of  treatment of second cycle has been discontinued because of progressive side effect in October of 2016. 5.  CT scan revealed slight enlargement of the left hilar nodule as well as gastrohepatic ligament adenopathy new from the April of 2016 (December, 2016)  6.  Patient has been started on   nivolulamab from February 03, 2015  INTERVAL HISTORY: Patient had myelosuppression and low-grade fever.  All the cultures were done and patient was started on IV antibiotics.  Feeling somewhat stronger.   Patient continues to feel weak and tired.  Has cough.  Yellowish expectoration.  Low-grade fever off and on.  Patient has noticed some swelling around the port site.  Somewhat depressed here for further follow-up regarding carcinoma of lung  Patient is here for ongoing evaluation.  There is generalized improvement in the strength.  Cough is better.  Appetite is been  improving.  No hemoptysis no chest pain.  No nausea no vomiting no diarrhea ROS  general status: Patient is feeling weak and tired.  No change in a performance status.  No chills.  No fever. HEENT: .  No evidence of stomatitis Lungs: Dry hacking cough with yellowish expectoration and low-grade fever Patient has noticed some swelling in the right chest wall area around the port site Cardiac: No chest pain or paroxysmal nocturnal dyspnea GI: Complains of diarrhea which is resolved Skin: No rash Lower extremity no swelling Neurological system: No tingling.  No numbness.  No other focal signs Musculoskeletal system no bony pains  As per HPI. Otherwise, a complete review of systems is negatve.  PAST MEDICAL HISTORY: Past Medical History  Diagnosis Date  . Coronary artery disease   . Pneumonia 2000  . Arthritis   . 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 Bruises easily   . GERD (gastroesophageal reflux disease)     takes Omeprazole daily as needed for stomach pain  . Blood transfusion 2001  . Diabetes mellitus     takes Metformin daily;  . Impaired hearing     bil hearing aide  . Macular degeneration     being watched for this but hasn't been "completely" diagnosed  . Myocardial infarction ([place]) 2001  . Cancer [facility])     PAST SURGICAL HISTORY: Past Surgical History  Procedure Laterality Date  . 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  Cardiac catheterization  2001  . Tonsillectomy      as a child  . Colonoscopy    . Cataract extraction      bilateral  . Lumbar laminectomy/decompression microdiscectomy  12/19/2010    Procedure: LUMBAR LAMINECTOMY/DECOMPRESSION MICRODISCECTOMY;  Surgeon: Elaina Hoops;  Location: [place] NEURO ORS;  Service: Neurosurgery;  Laterality: N/A;  Lumbar three-four, four-five decompressive lumbar laminectomy  . Joint replacement  right tkr  . Eye surgery      cataracts  . Back surgery      l4 5  . Portacath placement N/A 05/20/2014     Procedure: INSERTION PORT-A-CATH;  Surgeon: Nestor Lewandowsky, MD;  Location: [facility] ORS;  Service: General;  Laterality: N/A;    FAMILY HISTORY Family History  Problem Relation Age of Onset  . Anesthesia problems Neg Hx   . Hypotension Neg Hx   . Malignant hyperthermia Neg Hx   . Pseudochol deficiency Neg Hx         ADVANCED DIRECTIVES: Patient does have advanced health care directive   HEALTH MAINTENANCE: Social History  Substance Use Topics  . Smoking status: Former Smoker -- 40 years    Types: Cigarettes    Quit date: 05/19/1999  . Smokeless tobacco: Never Used     Comment: 2001  . Alcohol Use: No     No Known Allergies  Current Outpatient Prescriptions  Medication Sig Dispense Refill  . albuterol (PROVENTIL HFA;VENTOLIN HFA) 108 (90 BASE) MCG/ACT inhaler Inhale 2 puffs into the lungs as needed for wheezing or shortness of breath.     . Alcohol Swabs (B-D SINGLE USE SWABS REGULAR) PADS     . ANORO ELLIPTA 62.5-25 MCG/INH AEPB   11  . aspirin 81 MG tablet     . benzonatate (TESSALON) 100 MG capsule TAKE 1 CAPSULE (100 MG TOTAL) BY MOUTH 3 (THREE) TIMES DAILY AS NEEDED FOR COUGH. 90 capsule 3  . beta carotene w/minerals (OCUVITE) tablet Take 1 tablet by mouth 2 (two) times daily.    Marland Kitchen bismuth subsalicylate (KAOPECTATE) 262 MG/15ML suspension Take 30 mLs by mouth 3 (three) times daily as needed. 360 mL 0  . Blood Glucose Calibration (TRUETEST CONTROL LEVEL 1) LIQD     . chlorpheniramine-HYDROcodone (TUSSIONEX PENNKINETIC ER) 10-8 MG/5ML SUER Take 5 mLs by mouth every 12 (twelve) hours as needed for cough. 140 mL 0  . diphenoxylate-atropine (LOMOTIL) 2.5-0.025 MG per tablet Take 1 tablet by mouth 4 (four) times daily as needed for diarrhea or loose stools. 45 tablet 1  . fluconazole (DIFLUCAN) 100 MG tablet TAKE 1 TABLET (100 MG TOTAL) BY MOUTH DAILY. 5 tablet 0  . glimepiride (AMARYL) 1 MG tablet     . glucose blood test strip     . Lancet Devices (TRUEDRAW LANCING DEVICE)  MISC     . lidocaine-prilocaine (EMLA) cream     . magnesium oxide (MAG-OX) 400 MG tablet Take 400 mg by mouth daily.      . metFORMIN (GLUCOPHAGE) 500 MG tablet Take 500 mg by mouth daily. Take '1000mg'$  po in morning, take '500mg'$  po at bedtime    . metoprolol (LOPRESSOR) 50 MG tablet daily. 50 mg 2 (two) times daily.    . naproxen (NAPROSYN) 500 MG tablet     . omeprazole (PRILOSEC) 40 MG capsule Take 40 mg by mouth daily as needed. For acid reflux      . ondansetron (ZOFRAN) 8 MG tablet Take 1 tablet (8 mg total) by mouth 2 (two) times  daily. Start the day after chemo for 3 days. Then take as needed for nausea or vomiting. 30 tablet 1  . oxyCODONE (ROXICODONE) 5 MG immediate release tablet Take 1 tablet (5 mg total) by mouth every 6 (six) hours as needed for severe pain. 30 tablet 0  . simvastatin (ZOCOR) 40 MG tablet Take 40 mg by mouth at bedtime.      . SYMBICORT 160-4.5 MCG/ACT inhaler   12  . TRUEPLUS LANCETS 28G MISC     . Melatonin 5 MG CAPS Take 1 capsule (5 mg total) by mouth at bedtime as needed.  0   No current facility-administered medications for this visit.   Facility-Administered Medications Ordered in Other Visits  Medication Dose Route Frequency Provider Last Rate Last Dose  . 0.9 %  sodium chloride infusion   Intravenous Continuous Forest Gleason, MD   Stopped at 12/11/14 1034  . lidocaine-prilocaine (EMLA) cream   Topical Once Evlyn Kanner, NP      . sodium chloride 0.9 % injection 10 mL  10 mL Intracatheter PRN Forest Gleason, MD   10 mL at 06/03/14 1106  . sodium chloride 0.9 % injection 10 mL  10 mL Intravenous PRN Forest Gleason, MD   10 mL at 07/30/14 0920  . sodium chloride 0.9 % injection 10 mL  10 mL Intracatheter PRN Forest Gleason, MD   10 mL at 12/11/14 0912    OBJECTIVE: Filed Vitals:   03/03/15 0953 03/03/15 0954  BP: 125/69   Pulse: 90   Temp: 96.9 F (36.1 C)   Resp:  18     Body mass index is 28.92 kg/(m^2).    ECOG FS:1 - Symptomatic but completely  ambulatory  Physical Exam  Gen. status: Patient is somewhat depressed.  Alert oriented not in any acute distress.  Chest wall area swelling and puffiness around the right upper lobe chest wall area without any redness or tenderness.  It appears that catheter entering into internal jugular vein has been somewhat tight Lungs: Diminished air entry on both sides occasional rhonchi.  Cardiac:  Tachycardia Abdominal exam revealed normal bowel sounds. The abdomen was soft, non-tender, and without masses, organomegaly, or appreciable enlargement of the abdominal aorta.  psychiatric systems somewhat depressed.   Neurologically, the patient was awake, alert, and oriented to person, place and time. There were no obvious focal neurologic abnormalities.  musculoskeletal system within normal limit   all other   12  system is been examined.     CBC Latest Ref Rng 03/03/2015 02/17/2015 02/03/2015  WBC 3.8 - 10.6 K/uL 6.9 5.8 6.2  Hemoglobin 13.0 - 18.0 g/dL 11.1(L) 10.7(L) 11.1(L)  Hematocrit 40.0 - 52.0 % 33.7(L) 32.4(L) 32.8(L)  Platelets 150 - 440 K/uL 167 153 181      ASSESSMENT: \ 1.  Carcinoma of lung left upper lobe.  Stage IIIa squamous cell carcinoma.  I General condition is improved with NIVOLULAMAB will continue that at present time 2.  Gayla Doss is 1.7 will give 1 g of intravenous magnesium. Continue chemotherapy  All lab data has been reviewed Evaluate patient in 4 weeks. Family had number of questions about how long treatment is going to be an prognosis and planning the vacation sutures were answered. Total duration of visit was25 minutes.  50% or more time was spent in counseling patient and family regarding prognosis and options of treatment and available resources Cancer of lung   Staging form: Lung, AJCC 7th Edition     Clinical:  T2, N2, M0 - Unsigned   Forest Gleason, MD   03/03/2015 10:38 AM

## 2015-03-03 NOTE — Addendum Note (Signed)
Addended by: Telford Nab on: 03/03/2015 11:12 AM   Modules accepted: Orders

## 2015-03-09 ENCOUNTER — Telehealth: Payer: Self-pay | Admitting: *Deleted

## 2015-03-09 DIAGNOSIS — R05 Cough: Secondary | ICD-10-CM

## 2015-03-09 DIAGNOSIS — R053 Chronic cough: Secondary | ICD-10-CM

## 2015-03-09 DIAGNOSIS — C3402 Malignant neoplasm of left main bronchus: Secondary | ICD-10-CM

## 2015-03-09 DIAGNOSIS — R0602 Shortness of breath: Secondary | ICD-10-CM

## 2015-03-09 MED ORDER — GUAIFENESIN ER 600 MG PO TB12: 600.0000 mg | ORAL_TABLET | Freq: Two times a day (BID) | ORAL | Status: DC

## 2015-03-09 NOTE — Telephone Encounter (Signed)
Cough is worsening to the point that he could hardly catch his breath last night.  It is nonproductive, but she could hear something in his chest that seemed like it was trying to come up. Denies fever or any other symptoms.  Advice please

## 2015-03-09 NOTE — Telephone Encounter (Signed)
Per VO Dr Oliva Bustard, Mucinex bid, DuoNeb BID if not using inhalers, CXR tomorrow then see MD afterwards. Agrees to appt at 115 for CXR and 200 for MD. Informed that mucinex was sent to pharmacy and she conformed that he is using Albuterol and Symbicort inhalers both

## 2015-03-10 ENCOUNTER — Ambulatory Visit
Admission: RE | Admit: 2015-03-10 | Discharge: 2015-03-10 | Disposition: A | Discharge status: Home / Self Care | Payer: Medicare HMO | Source: Ambulatory Visit | Attending: Oncology | Admitting: Oncology

## 2015-03-10 ENCOUNTER — Encounter: Payer: Self-pay | Admitting: Oncology

## 2015-03-10 ENCOUNTER — Inpatient Hospital Stay: Payer: Medicare HMO | Attending: Oncology | Admitting: Oncology

## 2015-03-10 VITALS — BP 120/67 | HR 94 | Temp 98.4°F | Resp 18 | Wt 222.0 lb

## 2015-03-10 DIAGNOSIS — C3402 Malignant neoplasm of left main bronchus: Secondary | ICD-10-CM | POA: Insufficient documentation

## 2015-03-10 DIAGNOSIS — R0602 Shortness of breath: Secondary | ICD-10-CM | POA: Diagnosis present

## 2015-03-10 DIAGNOSIS — Z87891 Personal history of nicotine dependence: Secondary | ICD-10-CM

## 2015-03-10 DIAGNOSIS — Z7984 Long term (current) use of oral hypoglycemic drugs: Secondary | ICD-10-CM | POA: Diagnosis not present

## 2015-03-10 DIAGNOSIS — G8929 Other chronic pain: Secondary | ICD-10-CM | POA: Insufficient documentation

## 2015-03-10 DIAGNOSIS — E119 Type 2 diabetes mellitus without complications: Secondary | ICD-10-CM | POA: Diagnosis not present

## 2015-03-10 DIAGNOSIS — R21 Rash and other nonspecific skin eruption: Secondary | ICD-10-CM | POA: Insufficient documentation

## 2015-03-10 DIAGNOSIS — C3492 Malignant neoplasm of unspecified part of left bronchus or lung: Secondary | ICD-10-CM | POA: Diagnosis present

## 2015-03-10 DIAGNOSIS — H3530 Unspecified macular degeneration: Secondary | ICD-10-CM | POA: Diagnosis not present

## 2015-03-10 DIAGNOSIS — Z79899 Other long term (current) drug therapy: Secondary | ICD-10-CM

## 2015-03-10 DIAGNOSIS — F329 Major depressive disorder, single episode, unspecified: Secondary | ICD-10-CM | POA: Insufficient documentation

## 2015-03-10 DIAGNOSIS — C3412 Malignant neoplasm of upper lobe, left bronchus or lung: Secondary | ICD-10-CM

## 2015-03-10 DIAGNOSIS — I2510 Atherosclerotic heart disease of native coronary artery without angina pectoris: Secondary | ICD-10-CM | POA: Insufficient documentation

## 2015-03-10 DIAGNOSIS — Z951 Presence of aortocoronary bypass graft: Secondary | ICD-10-CM | POA: Diagnosis not present

## 2015-03-10 DIAGNOSIS — R509 Fever, unspecified: Secondary | ICD-10-CM | POA: Insufficient documentation

## 2015-03-10 DIAGNOSIS — I252 Old myocardial infarction: Secondary | ICD-10-CM | POA: Insufficient documentation

## 2015-03-10 DIAGNOSIS — Z5112 Encounter for antineoplastic immunotherapy: Secondary | ICD-10-CM | POA: Diagnosis not present

## 2015-03-10 DIAGNOSIS — D759 Disease of blood and blood-forming organs, unspecified: Secondary | ICD-10-CM

## 2015-03-10 DIAGNOSIS — M549 Dorsalgia, unspecified: Secondary | ICD-10-CM | POA: Diagnosis not present

## 2015-03-10 DIAGNOSIS — Z7982 Long term (current) use of aspirin: Secondary | ICD-10-CM | POA: Insufficient documentation

## 2015-03-10 DIAGNOSIS — K219 Gastro-esophageal reflux disease without esophagitis: Secondary | ICD-10-CM | POA: Insufficient documentation

## 2015-03-10 DIAGNOSIS — R05 Cough: Secondary | ICD-10-CM | POA: Insufficient documentation

## 2015-03-10 DIAGNOSIS — R531 Weakness: Secondary | ICD-10-CM | POA: Diagnosis not present

## 2015-03-10 DIAGNOSIS — X58XXXS Exposure to other specified factors, sequela: Secondary | ICD-10-CM | POA: Diagnosis not present

## 2015-03-10 DIAGNOSIS — T82898S Other specified complication of vascular prosthetic devices, implants and grafts, sequela: Secondary | ICD-10-CM | POA: Diagnosis not present

## 2015-03-10 DIAGNOSIS — R093 Abnormal sputum: Secondary | ICD-10-CM | POA: Diagnosis not present

## 2015-03-10 DIAGNOSIS — R5383 Other fatigue: Secondary | ICD-10-CM | POA: Diagnosis not present

## 2015-03-10 DIAGNOSIS — M1990 Unspecified osteoarthritis, unspecified site: Secondary | ICD-10-CM | POA: Diagnosis not present

## 2015-03-10 DIAGNOSIS — R053 Chronic cough: Secondary | ICD-10-CM

## 2015-03-10 MED ORDER — LEVOFLOXACIN 500 MG PO TABS: 500.0000 mg | ORAL_TABLET | Freq: Every day | ORAL | Status: DC

## 2015-03-10 MED ORDER — FLUCONAZOLE 100 MG PO TABS: 100.0000 mg | ORAL_TABLET | Freq: Every day | ORAL | Status: DC

## 2015-03-10 NOTE — Progress Notes (Signed)
Patient states he is feeling much better today.  Cough has subsided.

## 2015-03-11 ENCOUNTER — Encounter: Payer: Self-pay | Admitting: Oncology

## 2015-03-11 NOTE — Progress Notes (Signed)
[place] @ [facility] Telephone:(336) [phone]  Fax:(336) [place]: 1940/03/28  MR#: [id number]  JKK#:[id number]  Patient Care Team: Albina Billet, MD as PCP - General (Internal Medicine)  CHIEF COMPLAINT:  Chief Complaint  Patient presents with  . Lung Cancer   Oncology History   75 year old gentleman with left hilar mass and mediastinal adenopathy Chronic smoker quit smoking in 2000 Non-insulin-dependent diabetes History of coronary artery disease with status post bypass surgery done in year 2000 1.  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  Patient is starting carboplatinum and Taxol with AUC of 2 and 45 mg/m respectively concurrent with radiation therapy (May 27, 2014) 3.  He has finished total 6 cycles of carboplatinum and Taxol weekly chemotherapy on July 02, 2014    4.  Patient had a PET scan done which shows partial response.  (September, 2016 Patient was start consolidation therapy from September 2 016 Day 8 of  treatment of second cycle has been discontinued because of progressive side effect in October of 2016. 5.  CT scan revealed slight enlargement of the left hilar nodule as well as gastrohepatic ligament adenopathy new from the April of 2016 (December, 2016)  6.  Patient has been started on   nivolulamab from February 03, 2015  INTERVAL HISTORY: Patient had myelosuppression and low-grade fever.  All the cultures were done and patient was started on IV antibiotics.  Feeling somewhat stronger.   Patient continues to feel weak and tired.  Has cough.  Yellowish expectoration.  Low-grade fever off and on.  Patient has noticed some swelling around the port site.  Somewhat depressed here for further follow-up regarding carcinoma of lung  Patient is here for ongoing evaluation.  There is generalized improvement in the strength.  Cough is better.  Appetite is been  improving.  No hemoptysis no chest pain.  No nausea no vomiting no diarrhea  Patient will received his last NIVOLULAMAB a week ago started having cough and shortness of breath at low-grade fever.  Cough with yellowish expectoration.  Came today further evaluation.  It started a few days ago and according to patient is gradually getting better ROS  general status: Patient is feeling weak and tired.  No change in a performance status.  No chills.  No fever. HEENT: .  No evidence of stomatitis Lungs: Dry hacking cough with yellowish expectoration and low-grade fever Patient has noticed some swelling in the right chest wall area around the port site Cardiac: No chest pain or paroxysmal nocturnal dyspnea GI: Complains of diarrhea which is resolved Skin: No rash Lower extremity no swelling Neurological system: No tingling.  No numbness.  No other focal signs Musculoskeletal system no bony pains  As per HPI. Otherwise, a complete review of systems is negatve.  PAST MEDICAL HISTORY: Past Medical History  Diagnosis Date  . Coronary artery disease   . Pneumonia 2000  . Arthritis   . 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 Bruises easily   . GERD (gastroesophageal reflux disease)     takes Omeprazole daily as needed for stomach pain  . Blood transfusion 2001  . Diabetes mellitus     takes Metformin daily;  . Impaired hearing     bil hearing aide  . Macular degeneration     being watched  for this but hasn't been "completely" diagnosed  . Myocardial infarction ([place]) 2001  . Cancer [facility])     PAST SURGICAL HISTORY: Past Surgical History  Procedure Laterality Date  . 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 Cardiac catheterization  2001  . Tonsillectomy      as a child  . Colonoscopy    . Cataract extraction      bilateral  . Lumbar laminectomy/decompression microdiscectomy  12/19/2010    Procedure: LUMBAR LAMINECTOMY/DECOMPRESSION MICRODISCECTOMY;  Surgeon: Elaina Hoops;  Location: [place] [facility];  Service: Neurosurgery;  Laterality: N/A;  Lumbar three-four, four-five decompressive lumbar laminectomy  . Joint replacement  right tkr  . Eye surgery      cataracts  . Back surgery      l4 5  . Portacath placement N/A 05/20/2014    Procedure: INSERTION PORT-A-CATH;  Surgeon: Nestor Lewandowsky, MD;  Location: [facility] ORS;  Service: General;  Laterality: N/A;    FAMILY HISTORY Family History  Problem Relation Age of Onset  . Anesthesia problems Neg Hx   . Hypotension Neg Hx   . Malignant hyperthermia Neg Hx   . Pseudochol deficiency Neg Hx         ADVANCED DIRECTIVES: Patient does have advanced health care directive   HEALTH MAINTENANCE: Social History  Substance Use Topics  . Smoking status: Former Smoker -- 40 years    Types: Cigarettes    Quit date: 05/19/1999  . Smokeless tobacco: Never Used     Comment: 2001  . Alcohol Use: No     No Known Allergies  Current Outpatient Prescriptions  Medication Sig Dispense Refill  . albuterol (PROVENTIL HFA;VENTOLIN HFA) 108 (90 BASE) MCG/ACT inhaler Inhale 2 puffs into the lungs as needed for wheezing or shortness of breath.     . Alcohol Swabs (B-D SINGLE USE SWABS REGULAR) PADS     . ANORO ELLIPTA 62.5-25 MCG/INH AEPB   11  . aspirin 81 MG tablet     . benzonatate (TESSALON) 100 MG capsule TAKE 1 CAPSULE (100 MG TOTAL) BY MOUTH 3 (THREE) TIMES DAILY AS NEEDED FOR COUGH. 90 capsule 3  . beta carotene w/minerals (OCUVITE) tablet Take 1 tablet by mouth 2 (two) times daily.    Marland Kitchen bismuth subsalicylate (KAOPECTATE) 262 MG/15ML suspension Take 30 mLs by mouth 3 (three) times daily as needed. 360 mL 0  . Blood Glucose Calibration (TRUETEST CONTROL LEVEL 1) LIQD     . chlorpheniramine-HYDROcodone (TUSSIONEX PENNKINETIC ER) 10-8 MG/5ML SUER Take 5 mLs by mouth every 12 (twelve) hours as needed for cough. 140 mL 0  . diphenoxylate-atropine (LOMOTIL) 2.5-0.025 MG per tablet Take 1 tablet by mouth 4 (four) times  daily as needed for diarrhea or loose stools. 45 tablet 1  . fluconazole (DIFLUCAN) 100 MG tablet Take 1 tablet (100 mg total) by mouth daily. 5 tablet 0  . glimepiride (AMARYL) 1 MG tablet     . glucose blood test strip     . Lancet Devices (TRUEDRAW LANCING DEVICE) MISC     . lidocaine-prilocaine (EMLA) cream     . magnesium oxide (MAG-OX) 400 MG tablet Take 400 mg by mouth daily.      . Melatonin 5 MG CAPS Take 1 capsule (5 mg total) by mouth at bedtime as needed.  0  . metFORMIN (GLUCOPHAGE) 500 MG tablet Take 500 mg by mouth daily. Take '1000mg'$  po in morning, take '500mg'$  po at bedtime    .  metoprolol (LOPRESSOR) 50 MG tablet daily. 50 mg 2 (two) times daily.    . naproxen (NAPROSYN) 500 MG tablet     . omeprazole (PRILOSEC) 40 MG capsule Take 40 mg by mouth daily as needed. For acid reflux      . ondansetron (ZOFRAN) 8 MG tablet Take 1 tablet (8 mg total) by mouth 2 (two) times daily. Start the day after chemo for 3 days. Then take as needed for nausea or vomiting. 30 tablet 1  . oxyCODONE (ROXICODONE) 5 MG immediate release tablet Take 1 tablet (5 mg total) by mouth every 6 (six) hours as needed for severe pain. 30 tablet 0  . simvastatin (ZOCOR) 40 MG tablet Take 40 mg by mouth at bedtime.      . SYMBICORT 160-4.5 MCG/ACT inhaler   12  . TRUEPLUS LANCETS 28G MISC     . guaiFENesin (MUCINEX) 600 MG 12 hr tablet Take 1 tablet (600 mg total) by mouth 2 (two) times daily. 60 tablet 2  . levofloxacin (LEVAQUIN) 500 MG tablet Take 1 tablet (500 mg total) by mouth daily. 7 tablet 0   No current facility-administered medications for this visit.   Facility-Administered Medications Ordered in Other Visits  Medication Dose Route Frequency Provider Last Rate Last Dose  . 0.9 %  sodium chloride infusion   Intravenous Continuous Forest Gleason, MD   Stopped at 12/11/14 1034  . lidocaine-prilocaine (EMLA) cream   Topical Once Evlyn Kanner, NP      . sodium chloride 0.9 % injection 10 mL  10 mL  Intracatheter PRN Forest Gleason, MD   10 mL at 06/03/14 1106  . sodium chloride 0.9 % injection 10 mL  10 mL Intravenous PRN Forest Gleason, MD   10 mL at 07/30/14 0920  . sodium chloride 0.9 % injection 10 mL  10 mL Intracatheter PRN Forest Gleason, MD   10 mL at 12/11/14 0912    OBJECTIVE: Filed Vitals:   03/10/15 1356  BP: 120/67  Pulse: 94  Temp: 98.4 F (36.9 C)  Resp: 18     Body mass index is 29.3 kg/(m^2).    ECOG FS:1 - Symptomatic but completely ambulatory  Physical Exam  Gen. status: Patient is somewhat depressed.  Alert oriented not in any acute distress.  Chest wall area swelling and puffiness around the right upper lobe chest wall area without any redness or tenderness.  It appears that catheter entering into internal jugular vein has been somewhat tight Lungs: Diminished air entry on both sides occasional rhonchi.  Cardiac:  Tachycardia Abdominal exam revealed normal bowel sounds. The abdomen was soft, non-tender, and without masses, organomegaly, or appreciable enlargement of the abdominal aorta.  psychiatric systems somewhat depressed.   Neurologically, the patient was awake, alert, and oriented to person, place and time. There were no obvious focal neurologic abnormalities.  musculoskeletal system within normal limit   all other   12  system is been examined.     CBC Latest Ref Rng 03/03/2015 02/17/2015 02/03/2015  WBC 3.8 - 10.6 K/uL 6.9 5.8 6.2  Hemoglobin 13.0 - 18.0 g/dL 11.1(L) 10.7(L) 11.1(L)  Hematocrit 40.0 - 52.0 % 33.7(L) 32.4(L) 32.8(L)  Platelets 150 - 440 K/uL 167 153 181    IMPRESSION: Slight increase in the degree of left hilar density and extension in the left upper lobe. This may be related to acute infiltrate superimposed on the previous changes although the possibility of progression of disease would deserve consideration as well. CT may  be helpful for further evaluation.   Electronically Signed  By: Inez Catalina M.D.  On: 03/10/2015  13:52   ASSESSMENT: \ 1.  Carcinoma of lung left upper lobe.  Stage IIIa squamous cell carcinoma.  I General condition is improved with NIVOLULAMAB will continue that at present time  Patient came in as an acute abdomen complaining of shortness of breath and cough.  Chest x-ray has been reviewed and shows infiltrate new in onset patient was started on Zithromax.   If not improved possibility of a pneumonitis due to [facility] can be considered.  A course of prednisone can be given.    Chest x-ray has been reviewed independently  Cancer of lung   Staging form: Lung, AJCC 7th Edition     Clinical: T2, N2, M0 - Marni Griffon, MD   03/11/2015 9:06 AM

## 2015-03-15 ENCOUNTER — Encounter: Payer: Self-pay | Admitting: Oncology

## 2015-03-17 ENCOUNTER — Inpatient Hospital Stay: Payer: Medicare HMO

## 2015-03-17 VITALS — BP 109/69 | HR 78 | Temp 95.8°F | Resp 18

## 2015-03-17 DIAGNOSIS — C3402 Malignant neoplasm of left main bronchus: Secondary | ICD-10-CM

## 2015-03-17 DIAGNOSIS — Z5112 Encounter for antineoplastic immunotherapy: Secondary | ICD-10-CM | POA: Diagnosis not present

## 2015-03-17 LAB — TSH: TSH: 0.355 u[IU]/mL (ref 0.350–4.500)

## 2015-03-17 MED ORDER — SODIUM CHLORIDE 0.9 % IV SOLN
240.0000 mg | Freq: Once | INTRAVENOUS | Status: AC
  Administered 2015-03-17: 240 mg via INTRAVENOUS
  Filled 2015-03-17: qty 20

## 2015-03-17 MED ORDER — SODIUM CHLORIDE 0.9 % IV SOLN
Freq: Once | INTRAVENOUS | Status: AC
  Administered 2015-03-17: 10:00:00 via INTRAVENOUS
  Filled 2015-03-17: qty 1000

## 2015-03-17 MED ORDER — HEPARIN SOD (PORK) LOCK FLUSH 100 UNIT/ML IV SOLN
500.0000 [IU] | Freq: Once | INTRAVENOUS | Status: AC | PRN
  Administered 2015-03-17: 500 [IU]
  Filled 2015-03-17: qty 5

## 2015-03-17 MED ORDER — SODIUM CHLORIDE 0.9 % IJ SOLN
10.0000 mL | INTRAMUSCULAR | Status: DC | PRN
  Administered 2015-03-17: 10 mL
  Filled 2015-03-17: qty 10

## 2015-03-17 NOTE — Progress Notes (Signed)
No TSH or T4 available at this time. Pharmacy called and spoke with MD. Orders given to obtain lab values today for TSH and T4.

## 2015-03-18 LAB — T4: T4 TOTAL: 7.8 ug/dL (ref 4.5–12.0)

## 2015-03-31 ENCOUNTER — Inpatient Hospital Stay: Payer: Medicare HMO

## 2015-03-31 ENCOUNTER — Inpatient Hospital Stay (HOSPITAL_BASED_OUTPATIENT_CLINIC_OR_DEPARTMENT_OTHER): Payer: Medicare HMO | Admitting: Oncology

## 2015-03-31 ENCOUNTER — Encounter: Payer: Self-pay | Admitting: Oncology

## 2015-03-31 VITALS — BP 111/64 | HR 87 | Temp 97.2°F | Resp 18 | Wt 216.5 lb

## 2015-03-31 DIAGNOSIS — R21 Rash and other nonspecific skin eruption: Secondary | ICD-10-CM

## 2015-03-31 DIAGNOSIS — Z7982 Long term (current) use of aspirin: Secondary | ICD-10-CM

## 2015-03-31 DIAGNOSIS — R509 Fever, unspecified: Secondary | ICD-10-CM

## 2015-03-31 DIAGNOSIS — Z87891 Personal history of nicotine dependence: Secondary | ICD-10-CM | POA: Diagnosis not present

## 2015-03-31 DIAGNOSIS — E531 Pyridoxine deficiency: Secondary | ICD-10-CM

## 2015-03-31 DIAGNOSIS — C3402 Malignant neoplasm of left main bronchus: Secondary | ICD-10-CM

## 2015-03-31 DIAGNOSIS — R5383 Other fatigue: Secondary | ICD-10-CM

## 2015-03-31 DIAGNOSIS — T82898S Other specified complication of vascular prosthetic devices, implants and grafts, sequela: Secondary | ICD-10-CM

## 2015-03-31 DIAGNOSIS — R093 Abnormal sputum: Secondary | ICD-10-CM

## 2015-03-31 DIAGNOSIS — Z5112 Encounter for antineoplastic immunotherapy: Secondary | ICD-10-CM | POA: Diagnosis not present

## 2015-03-31 DIAGNOSIS — C3412 Malignant neoplasm of upper lobe, left bronchus or lung: Secondary | ICD-10-CM | POA: Diagnosis not present

## 2015-03-31 DIAGNOSIS — Z7984 Long term (current) use of oral hypoglycemic drugs: Secondary | ICD-10-CM

## 2015-03-31 DIAGNOSIS — R05 Cough: Secondary | ICD-10-CM

## 2015-03-31 DIAGNOSIS — F329 Major depressive disorder, single episode, unspecified: Secondary | ICD-10-CM

## 2015-03-31 LAB — COMPREHENSIVE METABOLIC PANEL
ALT: 24 U/L (ref 17–63)
ANION GAP: 9 (ref 5–15)
AST: 37 U/L (ref 15–41)
Albumin: 3.9 g/dL (ref 3.5–5.0)
Alkaline Phosphatase: 48 U/L (ref 38–126)
BUN: 26 mg/dL — ABNORMAL HIGH (ref 6–20)
CALCIUM: 9.2 mg/dL (ref 8.9–10.3)
CHLORIDE: 108 mmol/L (ref 101–111)
CO2: 19 mmol/L — AB (ref 22–32)
CREATININE: 1.3 mg/dL — AB (ref 0.61–1.24)
GFR, EST NON AFRICAN AMERICAN: 52 mL/min — AB (ref >60)
Glucose, Bld: 228 mg/dL — ABNORMAL HIGH (ref 65–99)
Potassium: 4.7 mmol/L (ref 3.5–5.1)
SODIUM: 136 mmol/L (ref 135–145)
Total Bilirubin: 0.7 mg/dL (ref 0.3–1.2)
Total Protein: 7.7 g/dL (ref 6.5–8.1)

## 2015-03-31 LAB — CBC WITH DIFFERENTIAL/PLATELET
Basophils Absolute: 0 10*3/uL (ref 0–0.1)
Basophils Relative: 1 %
EOS ABS: 0.2 10*3/uL (ref 0–0.7)
Eosinophils Relative: 4 %
HCT: 34.3 % — ABNORMAL LOW (ref 40.0–52.0)
Hemoglobin: 11.4 g/dL — ABNORMAL LOW (ref 13.0–18.0)
LYMPHS ABS: 0.8 10*3/uL — AB (ref 1.0–3.6)
Lymphocytes Relative: 13 %
MCH: 28.7 pg (ref 26.0–34.0)
MCHC: 33.3 g/dL (ref 32.0–36.0)
MCV: 86.1 fL (ref 80.0–100.0)
Monocytes Absolute: 0.7 10*3/uL (ref 0.2–1.0)
Monocytes Relative: 12 %
Neutro Abs: 4.6 10*3/uL (ref 1.4–6.5)
Neutrophils Relative %: 70 %
PLATELETS: 181 10*3/uL (ref 150–440)
RBC: 3.99 MIL/uL — AB (ref 4.40–5.90)
RDW: 16 % — AB (ref 11.5–14.5)
WBC: 6.5 10*3/uL (ref 3.8–10.6)

## 2015-03-31 LAB — MAGNESIUM: MAGNESIUM: 1.9 mg/dL (ref 1.7–2.4)

## 2015-03-31 MED ORDER — HEPARIN SOD (PORK) LOCK FLUSH 100 UNIT/ML IV SOLN
500.0000 [IU] | Freq: Once | INTRAVENOUS | Status: AC
  Administered 2015-03-31: 500 [IU] via INTRAVENOUS
  Filled 2015-03-31: qty 5

## 2015-03-31 MED ORDER — SODIUM CHLORIDE 0.9 % IV SOLN
240.0000 mg | Freq: Once | INTRAVENOUS | Status: AC
  Administered 2015-03-31: 240 mg via INTRAVENOUS
  Filled 2015-03-31: qty 20

## 2015-03-31 MED ORDER — SODIUM CHLORIDE 0.9 % IV SOLN
Freq: Once | INTRAVENOUS | Status: AC
  Administered 2015-03-31: 10:00:00 via INTRAVENOUS
  Filled 2015-03-31: qty 1000

## 2015-03-31 MED ORDER — SODIUM CHLORIDE 0.9% FLUSH
10.0000 mL | INTRAVENOUS | Status: DC | PRN
Start: 2015-03-31 — End: 2015-03-31
  Administered 2015-03-31: 10 mL via INTRAVENOUS
  Filled 2015-03-31: qty 10

## 2015-03-31 NOTE — Progress Notes (Signed)
[place] @ [facility] Telephone:(336) [phone]  Fax:(336) [place]: May 24, 1940  MR#: [id number]  ATF#:[id number]  Patient Care Team: Albina Billet, MD as PCP - General (Internal Medicine)  CHIEF COMPLAINT:  Chief Complaint  Patient presents with  . Lung Cancer   Oncology History   75 year old gentleman with left hilar mass and mediastinal adenopathy Chronic smoker quit smoking in 2000 Non-insulin-dependent diabetes History of coronary artery disease with status post bypass surgery done in year 2000 1.  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  Patient is starting carboplatinum and Taxol with AUC of 2 and 45 mg/m respectively concurrent with radiation therapy (May 27, 2014) 3.  He has finished total 6 cycles of carboplatinum and Taxol weekly chemotherapy on July 02, 2014    4.  Patient had a PET scan done which shows partial response.  (September, 2016 Patient was start consolidation therapy from September 2 016 Day 8 of  treatment of second cycle has been discontinued because of progressive side effect in October of 2016. 5.  CT scan revealed slight enlargement of the left hilar nodule as well as gastrohepatic ligament adenopathy new from the April of 2016 (December, 2016)  6.  Patient has been started on   nivolulamab from February 03, 2015  INTERVAL HISTORY: Patient had myelosuppression and low-grade fever.  All the cultures were done and patient was started on IV antibiotics.  Feeling somewhat stronger.   Patient continues to feel weak and tired.  Has cough.  Yellowish expectoration.  Low-grade fever off and on.  Patient has noticed some swelling around the port site.  Somewhat depressed here for further follow-up regarding carcinoma of lung  Patient is here for ongoing evaluation.  There is generalized improvement in the strength.  Cough is better.  Appetite is been  improving.  No hemoptysis no chest pain.  No nausea no vomiting no diarrhea Patient is starting next cycle of chemotherapy with NIVOLULMAB.  Feeling better.  Cough is improved.  Shortness of breath is better.  No nausea no vomiting.  Rash in the left lower extremity around the ankle No diarrhea ROS  general status: Patient is feeling weak and tired.  No change in a performance status.  No chills.  No fever. HEENT: .  No evidence of stomatitis Lungs: Dry hacking cough with yellowish expectoration and low-grade fever Patient has noticed some swelling in the right chest wall area around the port site Cardiac: No chest pain or paroxysmal nocturnal dyspnea GI: Complains of diarrhea which is resolved Skin: No rash Lower extremity no swelling Neurological system: No tingling.  No numbness.  No other focal signs Musculoskeletal system no bony pains  As per HPI. Otherwise, a complete review of systems is negatve.  PAST MEDICAL HISTORY: Past Medical History  Diagnosis Date  . Coronary artery disease   . Pneumonia 2000  . Arthritis   . 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 Bruises easily   . GERD (gastroesophageal reflux disease)     takes Omeprazole daily as needed for stomach pain  . Blood transfusion 2001  . Diabetes mellitus     takes Metformin daily;  . Impaired hearing     bil hearing aide  . Macular degeneration     being watched for this but hasn't been "completely"  diagnosed  . Myocardial infarction ([place]) 2001  . Cancer [facility])     PAST SURGICAL HISTORY: Past Surgical History  Procedure Laterality Date  . 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 Cardiac catheterization  2001  . Tonsillectomy      as a child  . Colonoscopy    . Cataract extraction      bilateral  . Lumbar laminectomy/decompression microdiscectomy  12/19/2010    Procedure: LUMBAR LAMINECTOMY/DECOMPRESSION MICRODISCECTOMY;  Surgeon: Elaina Hoops;  Location: [place] [facility];  Service:  Neurosurgery;  Laterality: N/A;  Lumbar three-four, four-five decompressive lumbar laminectomy  . Joint replacement  right tkr  . Eye surgery      cataracts  . Back surgery      l4 5  . Portacath placement N/A 05/20/2014    Procedure: INSERTION PORT-A-CATH;  Surgeon: Nestor Lewandowsky, MD;  Location: [facility] ORS;  Service: General;  Laterality: N/A;    FAMILY HISTORY Family History  Problem Relation Age of Onset  . Anesthesia problems Neg Hx   . Hypotension Neg Hx   . Malignant hyperthermia Neg Hx   . Pseudochol deficiency Neg Hx         ADVANCED DIRECTIVES: Patient does have advanced health care directive   HEALTH MAINTENANCE: Social History  Substance Use Topics  . Smoking status: Former Smoker -- 40 years    Types: Cigarettes    Quit date: 05/19/1999  . Smokeless tobacco: Never Used     Comment: 2001  . Alcohol Use: No     No Known Allergies  Current Outpatient Prescriptions  Medication Sig Dispense Refill  . albuterol (PROVENTIL HFA;VENTOLIN HFA) 108 (90 BASE) MCG/ACT inhaler Inhale 2 puffs into the lungs as needed for wheezing or shortness of breath.     . Alcohol Swabs (B-D SINGLE USE SWABS REGULAR) PADS     . ANORO ELLIPTA 62.5-25 MCG/INH AEPB   11  . aspirin 81 MG tablet     . benzonatate (TESSALON) 100 MG capsule TAKE 1 CAPSULE (100 MG TOTAL) BY MOUTH 3 (THREE) TIMES DAILY AS NEEDED FOR COUGH. 90 capsule 3  . beta carotene w/minerals (OCUVITE) tablet Take 1 tablet by mouth 2 (two) times daily.    Marland Kitchen bismuth subsalicylate (KAOPECTATE) 262 MG/15ML suspension Take 30 mLs by mouth 3 (three) times daily as needed. 360 mL 0  . Blood Glucose Calibration (TRUETEST CONTROL LEVEL 1) LIQD     . chlorpheniramine-HYDROcodone (TUSSIONEX PENNKINETIC ER) 10-8 MG/5ML SUER Take 5 mLs by mouth every 12 (twelve) hours as needed for cough. 140 mL 0  . diphenoxylate-atropine (LOMOTIL) 2.5-0.025 MG per tablet Take 1 tablet by mouth 4 (four) times daily as needed for diarrhea or loose  stools. 45 tablet 1  . fluconazole (DIFLUCAN) 100 MG tablet Take 1 tablet (100 mg total) by mouth daily. 5 tablet 0  . glimepiride (AMARYL) 1 MG tablet     . glucose blood test strip     . guaiFENesin (MUCINEX) 600 MG 12 hr tablet Take 1 tablet (600 mg total) by mouth 2 (two) times daily. 60 tablet 2  . Lancet Devices (TRUEDRAW LANCING DEVICE) MISC     . levofloxacin (LEVAQUIN) 500 MG tablet Take 1 tablet (500 mg total) by mouth daily. 7 tablet 0  . lidocaine-prilocaine (EMLA) cream     . magnesium oxide (MAG-OX) 400 MG tablet Take 400 mg by mouth daily.      . Melatonin 5 MG CAPS Take 1  capsule (5 mg total) by mouth at bedtime as needed.  0  . metFORMIN (GLUCOPHAGE) 500 MG tablet Take 500 mg by mouth daily. Take '1000mg'$  po in morning, take '500mg'$  po at bedtime    . metoprolol (LOPRESSOR) 50 MG tablet daily. 50 mg 2 (two) times daily.    . naproxen (NAPROSYN) 500 MG tablet     . omeprazole (PRILOSEC) 40 MG capsule Take 40 mg by mouth daily as needed. For acid reflux      . ondansetron (ZOFRAN) 8 MG tablet Take 1 tablet (8 mg total) by mouth 2 (two) times daily. Start the day after chemo for 3 days. Then take as needed for nausea or vomiting. 30 tablet 1  . oxyCODONE (ROXICODONE) 5 MG immediate release tablet Take 1 tablet (5 mg total) by mouth every 6 (six) hours as needed for severe pain. 30 tablet 0  . simvastatin (ZOCOR) 40 MG tablet Take 40 mg by mouth at bedtime.      . SYMBICORT 160-4.5 MCG/ACT inhaler   12  . TRUEPLUS LANCETS 28G MISC     . traMADol (ULTRAM) 50 MG tablet      No current facility-administered medications for this visit.   Facility-Administered Medications Ordered in Other Visits  Medication Dose Route Frequency Provider Last Rate Last Dose  . 0.9 %  sodium chloride infusion   Intravenous Continuous Forest Gleason, MD   Stopped at 12/11/14 1034  . heparin lock flush 100 unit/mL  500 Units Intravenous Once Forest Gleason, MD      . lidocaine-prilocaine (EMLA) cream    Topical Once Evlyn Kanner, NP      . sodium chloride 0.9 % injection 10 mL  10 mL Intracatheter PRN Forest Gleason, MD   10 mL at 06/03/14 1106  . sodium chloride 0.9 % injection 10 mL  10 mL Intravenous PRN Forest Gleason, MD   10 mL at 07/30/14 0920  . sodium chloride 0.9 % injection 10 mL  10 mL Intracatheter PRN Forest Gleason, MD   10 mL at 12/11/14 0912  . sodium chloride flush (NS) 0.9 % injection 10 mL  10 mL Intravenous PRN Forest Gleason, MD   10 mL at 03/31/15 0900    OBJECTIVE: Filed Vitals:   03/31/15 0913  BP: 111/64  Pulse: 87  Temp: 97.2 F (36.2 C)  Resp: 18     Body mass index is 28.57 kg/(m^2).    ECOG FS:1 - Symptomatic but completely ambulatory  Physical Exam  Gen. status: Patient is somewhat depressed.  Alert oriented not in any acute distress.  Chest wall area swelling and puffiness around the right upper lobe chest wall area without any redness or tenderness.  It appears that catheter entering into internal jugular vein has been somewhat tight Lungs: Diminished air entry on both sides occasional rhonchi.  Cardiac:  Tachycardia Abdominal exam revealed normal bowel sounds. The abdomen was soft, non-tender, and without masses, organomegaly, or appreciable enlargement of the abdominal aorta.  psychiatric systems somewhat depressed.   Neurologically, the patient was awake, alert, and oriented to person, place and time. There were no obvious focal neurologic abnormalities.  musculoskeletal system within normal limit   Rash around the left lower extremity around the ankle appears to be unrelated to chemotherapy as it seemed a small area and note generalized rash  all other   12  system is been examined.     CBC Latest Ref Rng 03/31/2015 03/03/2015 02/17/2015  WBC 3.8 - 10.6  K/uL 6.5 6.9 5.8  Hemoglobin 13.0 - 18.0 g/dL 11.4(L) 11.1(L) 10.7(L)  Hematocrit 40.0 - 52.0 % 34.3(L) 33.7(L) 32.4(L)  Platelets 150 - 440 K/uL 181 167 153    IMPRESSION: Slight increase in the  degree of left hilar density and extension in the left upper lobe. This may be related to acute infiltrate superimposed on the previous changes although the possibility of progression of disease would deserve consideration as well. CT may be helpful for further evaluation.   Electronically Signed  By: Inez Catalina M.D.  On: 03/10/2015 13:52   ASSESSMENT: \ 1.  Carcinoma of lung left upper lobe.  Stage IIIa squamous cell carcinoma.  I General condition is improved with NIVOLULAMAB will continue that at present time  Gen. condition is improved.  Symptomatic improvement with cough and shortness of breath  Rash appears to be unrelated as is localizing a small area in the left ankle  Continues NIVOLULMAB 240 mg today and in 2 weeks in 4 weeks for reevaluation with PET scan would be planned    Staging form: Lung, AJCC 7th Edition     Clinical: T2, N2, M0 - Marni Griffon, MD   03/31/2015 9:37 AM

## 2015-04-14 ENCOUNTER — Inpatient Hospital Stay: Payer: Medicare HMO

## 2015-04-14 ENCOUNTER — Other Ambulatory Visit: Payer: Self-pay | Admitting: Oncology

## 2015-04-14 ENCOUNTER — Inpatient Hospital Stay: Payer: Medicare HMO | Attending: Oncology

## 2015-04-14 VITALS — BP 102/60 | HR 73 | Temp 97.2°F | Resp 18

## 2015-04-14 DIAGNOSIS — F329 Major depressive disorder, single episode, unspecified: Secondary | ICD-10-CM | POA: Insufficient documentation

## 2015-04-14 DIAGNOSIS — G8929 Other chronic pain: Secondary | ICD-10-CM | POA: Insufficient documentation

## 2015-04-14 DIAGNOSIS — I252 Old myocardial infarction: Secondary | ICD-10-CM | POA: Diagnosis not present

## 2015-04-14 DIAGNOSIS — Z79899 Other long term (current) drug therapy: Secondary | ICD-10-CM | POA: Diagnosis not present

## 2015-04-14 DIAGNOSIS — C3402 Malignant neoplasm of left main bronchus: Secondary | ICD-10-CM

## 2015-04-14 DIAGNOSIS — I2510 Atherosclerotic heart disease of native coronary artery without angina pectoris: Secondary | ICD-10-CM | POA: Insufficient documentation

## 2015-04-14 DIAGNOSIS — C3412 Malignant neoplasm of upper lobe, left bronchus or lung: Secondary | ICD-10-CM | POA: Diagnosis not present

## 2015-04-14 DIAGNOSIS — K219 Gastro-esophageal reflux disease without esophagitis: Secondary | ICD-10-CM | POA: Insufficient documentation

## 2015-04-14 DIAGNOSIS — Z923 Personal history of irradiation: Secondary | ICD-10-CM | POA: Insufficient documentation

## 2015-04-14 DIAGNOSIS — R05 Cough: Secondary | ICD-10-CM | POA: Diagnosis not present

## 2015-04-14 DIAGNOSIS — R531 Weakness: Secondary | ICD-10-CM | POA: Diagnosis not present

## 2015-04-14 DIAGNOSIS — Z951 Presence of aortocoronary bypass graft: Secondary | ICD-10-CM | POA: Insufficient documentation

## 2015-04-14 DIAGNOSIS — E119 Type 2 diabetes mellitus without complications: Secondary | ICD-10-CM | POA: Diagnosis not present

## 2015-04-14 DIAGNOSIS — Z7901 Long term (current) use of anticoagulants: Secondary | ICD-10-CM | POA: Diagnosis not present

## 2015-04-14 DIAGNOSIS — Z7984 Long term (current) use of oral hypoglycemic drugs: Secondary | ICD-10-CM | POA: Diagnosis not present

## 2015-04-14 DIAGNOSIS — R509 Fever, unspecified: Secondary | ICD-10-CM | POA: Diagnosis not present

## 2015-04-14 DIAGNOSIS — Z87891 Personal history of nicotine dependence: Secondary | ICD-10-CM | POA: Insufficient documentation

## 2015-04-14 DIAGNOSIS — Z7982 Long term (current) use of aspirin: Secondary | ICD-10-CM | POA: Insufficient documentation

## 2015-04-14 DIAGNOSIS — Z9221 Personal history of antineoplastic chemotherapy: Secondary | ICD-10-CM | POA: Insufficient documentation

## 2015-04-14 DIAGNOSIS — M1990 Unspecified osteoarthritis, unspecified site: Secondary | ICD-10-CM | POA: Diagnosis not present

## 2015-04-14 DIAGNOSIS — M549 Dorsalgia, unspecified: Secondary | ICD-10-CM | POA: Insufficient documentation

## 2015-04-14 DIAGNOSIS — R5383 Other fatigue: Secondary | ICD-10-CM | POA: Insufficient documentation

## 2015-04-14 DIAGNOSIS — R590 Localized enlarged lymph nodes: Secondary | ICD-10-CM | POA: Insufficient documentation

## 2015-04-14 DIAGNOSIS — Z5112 Encounter for antineoplastic immunotherapy: Secondary | ICD-10-CM | POA: Insufficient documentation

## 2015-04-14 LAB — CBC WITH DIFFERENTIAL/PLATELET
BASOS ABS: 0 10*3/uL (ref 0–0.1)
BASOS PCT: 0 %
EOS ABS: 0.2 10*3/uL (ref 0–0.7)
EOS PCT: 3 %
HCT: 32.8 % — ABNORMAL LOW (ref 40.0–52.0)
HEMOGLOBIN: 10.8 g/dL — AB (ref 13.0–18.0)
LYMPHS ABS: 0.7 10*3/uL — AB (ref 1.0–3.6)
Lymphocytes Relative: 11 %
MCH: 27.7 pg (ref 26.0–34.0)
MCHC: 33 g/dL (ref 32.0–36.0)
MCV: 84.1 fL (ref 80.0–100.0)
Monocytes Absolute: 1.1 10*3/uL — ABNORMAL HIGH (ref 0.2–1.0)
Monocytes Relative: 19 %
NEUTROS PCT: 67 %
Neutro Abs: 3.9 10*3/uL (ref 1.4–6.5)
PLATELETS: 162 10*3/uL (ref 150–440)
RBC: 3.9 MIL/uL — AB (ref 4.40–5.90)
RDW: 16.8 % — ABNORMAL HIGH (ref 11.5–14.5)
WBC: 5.8 10*3/uL (ref 3.8–10.6)

## 2015-04-14 LAB — COMPREHENSIVE METABOLIC PANEL
ALBUMIN: 3.5 g/dL (ref 3.5–5.0)
ALK PHOS: 31 U/L — AB (ref 38–126)
ALT: 21 U/L (ref 17–63)
AST: 29 U/L (ref 15–41)
Anion gap: 7 (ref 5–15)
BUN: 21 mg/dL — ABNORMAL HIGH (ref 6–20)
CALCIUM: 8.2 mg/dL — AB (ref 8.9–10.3)
CHLORIDE: 105 mmol/L (ref 101–111)
CO2: 19 mmol/L — AB (ref 22–32)
CREATININE: 1.27 mg/dL — AB (ref 0.61–1.24)
GFR calc non Af Amer: 54 mL/min — ABNORMAL LOW (ref >60)
GLUCOSE: 143 mg/dL — AB (ref 65–99)
Potassium: 4 mmol/L (ref 3.5–5.1)
SODIUM: 131 mmol/L — AB (ref 135–145)
Total Bilirubin: 0.5 mg/dL (ref 0.3–1.2)
Total Protein: 7.2 g/dL (ref 6.5–8.1)

## 2015-04-14 LAB — MAGNESIUM: Magnesium: 1.9 mg/dL (ref 1.7–2.4)

## 2015-04-14 MED ORDER — HEPARIN SOD (PORK) LOCK FLUSH 100 UNIT/ML IV SOLN
500.0000 [IU] | Freq: Once | INTRAVENOUS | Status: AC | PRN
  Administered 2015-04-14: 500 [IU]

## 2015-04-14 MED ORDER — SODIUM CHLORIDE 0.9 % IV SOLN
Freq: Once | INTRAVENOUS | Status: AC
  Administered 2015-04-14: 10:00:00 via INTRAVENOUS
  Filled 2015-04-14: qty 1000

## 2015-04-14 MED ORDER — SODIUM CHLORIDE 0.9 % IV SOLN
240.0000 mg | Freq: Once | INTRAVENOUS | Status: AC
  Administered 2015-04-14: 240 mg via INTRAVENOUS
  Filled 2015-04-14: qty 8

## 2015-04-14 MED ORDER — SODIUM CHLORIDE 0.9 % IJ SOLN
10.0000 mL | INTRAMUSCULAR | Status: DC | PRN
  Administered 2015-04-14: 10 mL
  Filled 2015-04-14: qty 10

## 2015-04-15 ENCOUNTER — Other Ambulatory Visit: Payer: Self-pay | Admitting: Oncology

## 2015-04-15 ENCOUNTER — Telehealth: Payer: Self-pay | Admitting: *Deleted

## 2015-04-15 NOTE — Telephone Encounter (Signed)
[place] patient

## 2015-04-15 NOTE — Telephone Encounter (Signed)
Called to report that she thinks Ivan Beasley is having side effects from his Ivan Beasley he got yesterday. He was having chills and facial flushing, chest pain when he coughs, temp got up to 100, but is nml now. Does she need to do anything ?

## 2015-04-15 NOTE — Telephone Encounter (Signed)
Discussed with Dr Mike Gip, Will watch for now, call back if does not improve. Traci in agreement with this

## 2015-04-16 ENCOUNTER — Telehealth: Payer: Self-pay | Admitting: *Deleted

## 2015-04-16 ENCOUNTER — Other Ambulatory Visit: Payer: Self-pay | Admitting: Oncology

## 2015-04-16 DIAGNOSIS — C3402 Malignant neoplasm of left main bronchus: Secondary | ICD-10-CM

## 2015-04-16 MED ORDER — FLUCONAZOLE 100 MG PO TABS: 100.0000 mg | ORAL_TABLET | Freq: Every day | ORAL | Status: DC

## 2015-04-16 NOTE — Telephone Encounter (Signed)
E scribed fluconazole

## 2015-04-17 ENCOUNTER — Encounter: Payer: Self-pay | Admitting: Emergency Medicine

## 2015-04-17 ENCOUNTER — Emergency Department: Payer: Medicare HMO

## 2015-04-17 ENCOUNTER — Inpatient Hospital Stay
Admission: EM | Admit: 2015-04-17 | Discharge: 2015-04-23 | DRG: 871 | Disposition: A | Discharge status: Home Health | Payer: Medicare HMO | Attending: Internal Medicine | Admitting: Internal Medicine

## 2015-04-17 ENCOUNTER — Other Ambulatory Visit: Payer: Self-pay

## 2015-04-17 DIAGNOSIS — E1122 Type 2 diabetes mellitus with diabetic chronic kidney disease: Secondary | ICD-10-CM | POA: Diagnosis present

## 2015-04-17 DIAGNOSIS — Z87891 Personal history of nicotine dependence: Secondary | ICD-10-CM | POA: Diagnosis not present

## 2015-04-17 DIAGNOSIS — A413 Sepsis due to Hemophilus influenzae: Principal | ICD-10-CM | POA: Diagnosis present

## 2015-04-17 DIAGNOSIS — D61818 Other pancytopenia: Secondary | ICD-10-CM | POA: Diagnosis present

## 2015-04-17 DIAGNOSIS — J441 Chronic obstructive pulmonary disease with (acute) exacerbation: Secondary | ICD-10-CM | POA: Diagnosis present

## 2015-04-17 DIAGNOSIS — J690 Pneumonitis due to inhalation of food and vomit: Secondary | ICD-10-CM | POA: Diagnosis not present

## 2015-04-17 DIAGNOSIS — I4891 Unspecified atrial fibrillation: Secondary | ICD-10-CM | POA: Diagnosis present

## 2015-04-17 DIAGNOSIS — I2510 Atherosclerotic heart disease of native coronary artery without angina pectoris: Secondary | ICD-10-CM | POA: Diagnosis present

## 2015-04-17 DIAGNOSIS — F329 Major depressive disorder, single episode, unspecified: Secondary | ICD-10-CM | POA: Diagnosis not present

## 2015-04-17 DIAGNOSIS — H9193 Unspecified hearing loss, bilateral: Secondary | ICD-10-CM | POA: Diagnosis present

## 2015-04-17 DIAGNOSIS — C3412 Malignant neoplasm of upper lobe, left bronchus or lung: Secondary | ICD-10-CM | POA: Diagnosis not present

## 2015-04-17 DIAGNOSIS — I129 Hypertensive chronic kidney disease with stage 1 through stage 4 chronic kidney disease, or unspecified chronic kidney disease: Secondary | ICD-10-CM | POA: Diagnosis present

## 2015-04-17 DIAGNOSIS — D638 Anemia in other chronic diseases classified elsewhere: Secondary | ICD-10-CM | POA: Diagnosis present

## 2015-04-17 DIAGNOSIS — Z923 Personal history of irradiation: Secondary | ICD-10-CM

## 2015-04-17 DIAGNOSIS — C3490 Malignant neoplasm of unspecified part of unspecified bronchus or lung: Secondary | ICD-10-CM | POA: Diagnosis present

## 2015-04-17 DIAGNOSIS — J9690 Respiratory failure, unspecified, unspecified whether with hypoxia or hypercapnia: Secondary | ICD-10-CM | POA: Diagnosis present

## 2015-04-17 DIAGNOSIS — J189 Pneumonia, unspecified organism: Secondary | ICD-10-CM

## 2015-04-17 DIAGNOSIS — E785 Hyperlipidemia, unspecified: Secondary | ICD-10-CM | POA: Diagnosis present

## 2015-04-17 DIAGNOSIS — J84114 Acute interstitial pneumonitis: Secondary | ICD-10-CM | POA: Diagnosis present

## 2015-04-17 DIAGNOSIS — J9600 Acute respiratory failure, unspecified whether with hypoxia or hypercapnia: Secondary | ICD-10-CM | POA: Insufficient documentation

## 2015-04-17 DIAGNOSIS — Z951 Presence of aortocoronary bypass graft: Secondary | ICD-10-CM | POA: Diagnosis not present

## 2015-04-17 DIAGNOSIS — N179 Acute kidney failure, unspecified: Secondary | ICD-10-CM | POA: Diagnosis present

## 2015-04-17 DIAGNOSIS — K219 Gastro-esophageal reflux disease without esophagitis: Secondary | ICD-10-CM | POA: Diagnosis present

## 2015-04-17 DIAGNOSIS — R6520 Severe sepsis without septic shock: Secondary | ICD-10-CM | POA: Diagnosis present

## 2015-04-17 DIAGNOSIS — J159 Unspecified bacterial pneumonia: Secondary | ICD-10-CM | POA: Diagnosis not present

## 2015-04-17 DIAGNOSIS — J9621 Acute and chronic respiratory failure with hypoxia: Secondary | ICD-10-CM | POA: Diagnosis present

## 2015-04-17 DIAGNOSIS — E876 Hypokalemia: Secondary | ICD-10-CM | POA: Diagnosis present

## 2015-04-17 DIAGNOSIS — J14 Pneumonia due to Hemophilus influenzae: Secondary | ICD-10-CM | POA: Diagnosis present

## 2015-04-17 DIAGNOSIS — J440 Chronic obstructive pulmonary disease with acute lower respiratory infection: Secondary | ICD-10-CM | POA: Diagnosis present

## 2015-04-17 DIAGNOSIS — I252 Old myocardial infarction: Secondary | ICD-10-CM

## 2015-04-17 DIAGNOSIS — C3482 Malignant neoplasm of overlapping sites of left bronchus and lung: Secondary | ICD-10-CM

## 2015-04-17 DIAGNOSIS — Z833 Family history of diabetes mellitus: Secondary | ICD-10-CM | POA: Diagnosis not present

## 2015-04-17 DIAGNOSIS — Z9989 Dependence on other enabling machines and devices: Secondary | ICD-10-CM | POA: Diagnosis not present

## 2015-04-17 DIAGNOSIS — F419 Anxiety disorder, unspecified: Secondary | ICD-10-CM | POA: Diagnosis present

## 2015-04-17 DIAGNOSIS — T451X5A Adverse effect of antineoplastic and immunosuppressive drugs, initial encounter: Secondary | ICD-10-CM | POA: Diagnosis present

## 2015-04-17 DIAGNOSIS — Y92531 Health care provider office as the place of occurrence of the external cause: Secondary | ICD-10-CM | POA: Diagnosis not present

## 2015-04-17 DIAGNOSIS — E872 Acidosis: Secondary | ICD-10-CM | POA: Diagnosis present

## 2015-04-17 DIAGNOSIS — N183 Chronic kidney disease, stage 3 (moderate): Secondary | ICD-10-CM | POA: Diagnosis present

## 2015-04-17 DIAGNOSIS — J9601 Acute respiratory failure with hypoxia: Secondary | ICD-10-CM | POA: Diagnosis not present

## 2015-04-17 DIAGNOSIS — J9811 Atelectasis: Secondary | ICD-10-CM

## 2015-04-17 DIAGNOSIS — R7881 Bacteremia: Secondary | ICD-10-CM | POA: Diagnosis not present

## 2015-04-17 LAB — TROPONIN I: Troponin I: 0.06 ng/mL — ABNORMAL HIGH (ref <0.031)

## 2015-04-17 LAB — CBC WITH DIFFERENTIAL/PLATELET
BASOS ABS: 0 10*3/uL (ref 0–0.1)
BASOS PCT: 0 %
EOS PCT: 0 %
Eosinophils Absolute: 0 10*3/uL (ref 0–0.7)
HEMATOCRIT: 30.3 % — AB (ref 40.0–52.0)
HEMOGLOBIN: 10 g/dL — AB (ref 13.0–18.0)
LYMPHS ABS: 0.4 10*3/uL — AB (ref 1.0–3.6)
Lymphocytes Relative: 5 %
MCH: 27.7 pg (ref 26.0–34.0)
MCHC: 33 g/dL (ref 32.0–36.0)
MCV: 83.9 fL (ref 80.0–100.0)
MONO ABS: 0.5 10*3/uL (ref 0.2–1.0)
Monocytes Relative: 6 %
Neutro Abs: 7.5 10*3/uL — ABNORMAL HIGH (ref 1.4–6.5)
Neutrophils Relative %: 89 %
Platelets: 154 10*3/uL (ref 150–440)
RBC: 3.61 MIL/uL — AB (ref 4.40–5.90)
RDW: 17.2 % — ABNORMAL HIGH (ref 11.5–14.5)
WBC: 8.4 10*3/uL (ref 3.8–10.6)

## 2015-04-17 LAB — BASIC METABOLIC PANEL
ANION GAP: 13 (ref 5–15)
ANION GAP: 13 (ref 5–15)
BUN: 55 mg/dL — ABNORMAL HIGH (ref 6–20)
BUN: 55 mg/dL — AB (ref 6–20)
CALCIUM: 7.9 mg/dL — AB (ref 8.9–10.3)
CHLORIDE: 102 mmol/L (ref 101–111)
CO2: 15 mmol/L — AB (ref 22–32)
CO2: 13 mmol/L — ABNORMAL LOW (ref 22–32)
Calcium: 8.3 mg/dL — ABNORMAL LOW (ref 8.9–10.3)
Chloride: 105 mmol/L (ref 101–111)
Creatinine, Ser: 3.59 mg/dL — ABNORMAL HIGH (ref 0.61–1.24)
Creatinine, Ser: 3.46 mg/dL — ABNORMAL HIGH (ref 0.61–1.24)
GFR calc Af Amer: 18 mL/min — ABNORMAL LOW (ref >60)
GFR calc non Af Amer: 15 mL/min — ABNORMAL LOW (ref >60)
GFR, EST AFRICAN AMERICAN: 18 mL/min — AB (ref >60)
GFR, EST NON AFRICAN AMERICAN: 16 mL/min — AB (ref >60)
GLUCOSE: 112 mg/dL — AB (ref 65–99)
GLUCOSE: 141 mg/dL — AB (ref 65–99)
POTASSIUM: 4.7 mmol/L (ref 3.5–5.1)
POTASSIUM: 4.9 mmol/L (ref 3.5–5.1)
SODIUM: 131 mmol/L — AB (ref 135–145)
Sodium: 130 mmol/L — ABNORMAL LOW (ref 135–145)

## 2015-04-17 LAB — LACTIC ACID, PLASMA
LACTIC ACID, VENOUS: 6.4 mmol/L — AB (ref 0.5–2.0)
Lactic Acid, Venous: 6.4 mmol/L (ref 0.5–2.0)

## 2015-04-17 LAB — MRSA PCR SCREENING: MRSA BY PCR: NEGATIVE

## 2015-04-17 LAB — INFLUENZA PANEL BY PCR (TYPE A & B)
H1N1 flu by pcr: NOT DETECTED
INFLAPCR: NEGATIVE
Influenza B By PCR: NEGATIVE

## 2015-04-17 LAB — GLUCOSE, CAPILLARY
Glucose-Capillary: 137 mg/dL — ABNORMAL HIGH (ref 65–99)
Glucose-Capillary: 195 mg/dL — ABNORMAL HIGH (ref 65–99)

## 2015-04-17 LAB — HEMOGLOBIN A1C: HEMOGLOBIN A1C: 7.4 % — AB (ref 4.0–6.0)

## 2015-04-17 LAB — PROCALCITONIN: PROCALCITONIN: 24.3 ng/mL

## 2015-04-17 LAB — MAGNESIUM: MAGNESIUM: 1.7 mg/dL (ref 1.7–2.4)

## 2015-04-17 MED ORDER — SIMVASTATIN 40 MG PO TABS
40.0000 mg | ORAL_TABLET | Freq: Every day | ORAL | Status: DC
  Administered 2015-04-17 – 2015-04-19 (×3): 40 mg via ORAL
  Filled 2015-04-17 (×3): qty 1

## 2015-04-17 MED ORDER — ACETAMINOPHEN 650 MG RE SUPP: 650.0000 mg | Freq: Four times a day (QID) | RECTAL | Status: DC | PRN

## 2015-04-17 MED ORDER — MOMETASONE FURO-FORMOTEROL FUM 200-5 MCG/ACT IN AERO
2.0000 | INHALATION_SPRAY | Freq: Two times a day (BID) | RESPIRATORY_TRACT | Status: DC
  Administered 2015-04-18 – 2015-04-19 (×3): 2 via RESPIRATORY_TRACT
  Filled 2015-04-17 (×2): qty 8.8

## 2015-04-17 MED ORDER — MAGNESIUM OXIDE 400 (241.3 MG) MG PO TABS
400.0000 mg | ORAL_TABLET | Freq: Every day | ORAL | Status: DC
  Administered 2015-04-18 – 2015-04-23 (×6): 400 mg via ORAL
  Filled 2015-04-17 (×7): qty 1

## 2015-04-17 MED ORDER — ONDANSETRON HCL 4 MG PO TABS: 4.0000 mg | ORAL_TABLET | Freq: Four times a day (QID) | ORAL | Status: DC | PRN

## 2015-04-17 MED ORDER — SODIUM CHLORIDE 0.9% FLUSH
3.0000 mL | Freq: Two times a day (BID) | INTRAVENOUS | Status: DC
  Administered 2015-04-17 – 2015-04-22 (×11): 3 mL via INTRAVENOUS

## 2015-04-17 MED ORDER — DILTIAZEM HCL 25 MG/5ML IV SOLN
10.0000 mg | Freq: Once | INTRAVENOUS | Status: AC
  Administered 2015-04-17: 10 mg via INTRAVENOUS

## 2015-04-17 MED ORDER — IPRATROPIUM-ALBUTEROL 0.5-2.5 (3) MG/3ML IN SOLN
3.0000 mL | Freq: Four times a day (QID) | RESPIRATORY_TRACT | Status: DC
  Administered 2015-04-17 – 2015-04-19 (×6): 3 mL via RESPIRATORY_TRACT
  Filled 2015-04-17 (×6): qty 3

## 2015-04-17 MED ORDER — HEPARIN SODIUM (PORCINE) 5000 UNIT/ML IJ SOLN
5000.0000 [IU] | Freq: Three times a day (TID) | INTRAMUSCULAR | Status: DC
  Administered 2015-04-17 – 2015-04-18 (×3): 5000 [IU] via SUBCUTANEOUS
  Filled 2015-04-17 (×3): qty 1

## 2015-04-17 MED ORDER — SODIUM CHLORIDE 0.9 % IV SOLN
INTRAVENOUS | Status: DC
  Administered 2015-04-17 – 2015-04-19 (×3): via INTRAVENOUS

## 2015-04-17 MED ORDER — ACETAMINOPHEN 325 MG PO TABS: 650.0000 mg | ORAL_TABLET | Freq: Four times a day (QID) | ORAL | Status: DC | PRN

## 2015-04-17 MED ORDER — DEXTROSE 5 % IV SOLN
2.0000 g | Freq: Once | INTRAVENOUS | Status: AC
  Administered 2015-04-17: 2 g via INTRAVENOUS
  Filled 2015-04-17: qty 2

## 2015-04-17 MED ORDER — CHLORHEXIDINE GLUCONATE 0.12 % MT SOLN
15.0000 mL | Freq: Two times a day (BID) | OROMUCOSAL | Status: DC
  Administered 2015-04-17 – 2015-04-19 (×6): 15 mL via OROMUCOSAL
  Filled 2015-04-17 (×5): qty 15

## 2015-04-17 MED ORDER — OXYCODONE HCL 5 MG PO TABS
5.0000 mg | ORAL_TABLET | ORAL | Status: DC | PRN
  Administered 2015-04-20 – 2015-04-22 (×4): 5 mg via ORAL
  Filled 2015-04-17 (×4): qty 1

## 2015-04-17 MED ORDER — METHYLPREDNISOLONE SODIUM SUCC 40 MG IJ SOLR: 40.0000 mg | Freq: Two times a day (BID) | INTRAMUSCULAR | Status: DC

## 2015-04-17 MED ORDER — IPRATROPIUM-ALBUTEROL 0.5-2.5 (3) MG/3ML IN SOLN
3.0000 mL | RESPIRATORY_TRACT | Status: DC
  Administered 2015-04-17: 3 mL via RESPIRATORY_TRACT
  Filled 2015-04-17 (×2): qty 3

## 2015-04-17 MED ORDER — PANTOPRAZOLE SODIUM 40 MG PO TBEC
40.0000 mg | DELAYED_RELEASE_TABLET | Freq: Every day | ORAL | Status: DC
  Administered 2015-04-18 – 2015-04-23 (×6): 40 mg via ORAL
  Filled 2015-04-17 (×7): qty 1

## 2015-04-17 MED ORDER — OCUVITE PO TABS
1.0000 | ORAL_TABLET | Freq: Two times a day (BID) | ORAL | Status: DC
  Administered 2015-04-17: 1 via ORAL
  Filled 2015-04-17 (×4): qty 1

## 2015-04-17 MED ORDER — GUAIFENESIN ER 600 MG PO TB12
600.0000 mg | ORAL_TABLET | Freq: Two times a day (BID) | ORAL | Status: DC
  Administered 2015-04-17 – 2015-04-19 (×4): 600 mg via ORAL
  Filled 2015-04-17 (×4): qty 1

## 2015-04-17 MED ORDER — DEXAMETHASONE SODIUM PHOSPHATE 10 MG/ML IJ SOLN
INTRAMUSCULAR | Status: AC
  Filled 2015-04-17: qty 2

## 2015-04-17 MED ORDER — INSULIN ASPART 100 UNIT/ML ~~LOC~~ SOLN
0.0000 [IU] | Freq: Three times a day (TID) | SUBCUTANEOUS | Status: DC
  Administered 2015-04-17: 3 [IU] via SUBCUTANEOUS
  Administered 2015-04-18 (×2): 7 [IU] via SUBCUTANEOUS
  Administered 2015-04-18 – 2015-04-19 (×2): 11 [IU] via SUBCUTANEOUS
  Administered 2015-04-19: 7 [IU] via SUBCUTANEOUS
  Administered 2015-04-19: 11 [IU] via SUBCUTANEOUS
  Administered 2015-04-20: 3 [IU] via SUBCUTANEOUS
  Administered 2015-04-20: 4 [IU] via SUBCUTANEOUS
  Administered 2015-04-20: 11 [IU] via SUBCUTANEOUS
  Administered 2015-04-21: 7 [IU] via SUBCUTANEOUS
  Administered 2015-04-21 – 2015-04-22 (×2): 4 [IU] via SUBCUTANEOUS
  Administered 2015-04-22: 7 [IU] via SUBCUTANEOUS
  Administered 2015-04-23: 11 [IU] via SUBCUTANEOUS
  Filled 2015-04-17: qty 4
  Filled 2015-04-17: qty 11
  Filled 2015-04-17: qty 7
  Filled 2015-04-17: qty 11
  Filled 2015-04-17: qty 7
  Filled 2015-04-17: qty 4
  Filled 2015-04-17: qty 7
  Filled 2015-04-17 (×2): qty 3
  Filled 2015-04-17 (×2): qty 7
  Filled 2015-04-17: qty 11
  Filled 2015-04-17: qty 4

## 2015-04-17 MED ORDER — ASPIRIN EC 81 MG PO TBEC
81.0000 mg | DELAYED_RELEASE_TABLET | Freq: Every day | ORAL | Status: DC
  Administered 2015-04-17 – 2015-04-23 (×7): 81 mg via ORAL
  Filled 2015-04-17 (×9): qty 1

## 2015-04-17 MED ORDER — SODIUM CHLORIDE 0.9 % IV BOLUS (SEPSIS)
1000.0000 mL | Freq: Once | INTRAVENOUS | Status: AC
  Administered 2015-04-17: 1000 mL via INTRAVENOUS

## 2015-04-17 MED ORDER — NYSTATIN 100000 UNIT/ML MT SUSP
5.0000 mL | Freq: Four times a day (QID) | OROMUCOSAL | Status: DC
  Administered 2015-04-17 – 2015-04-23 (×21): 500000 [IU] via ORAL
  Filled 2015-04-17 (×29): qty 5

## 2015-04-17 MED ORDER — PIPERACILLIN-TAZOBACTAM 3.375 G IVPB
3.3750 g | Freq: Three times a day (TID) | INTRAVENOUS | Status: DC
  Administered 2015-04-17 – 2015-04-18 (×2): 3.375 g via INTRAVENOUS
  Filled 2015-04-17 (×4): qty 50

## 2015-04-17 MED ORDER — GUAIFENESIN-CODEINE 100-10 MG/5ML PO SOLN
10.0000 mL | ORAL | Status: DC | PRN
Start: 2015-04-17 — End: 2015-04-19
  Administered 2015-04-17 – 2015-04-19 (×2): 10 mL via ORAL
  Filled 2015-04-17 (×2): qty 10

## 2015-04-17 MED ORDER — DIPHENHYDRAMINE HCL 50 MG/ML IJ SOLN
25.0000 mg | Freq: Once | INTRAMUSCULAR | Status: AC
  Administered 2015-04-17: 25 mg via INTRAVENOUS
  Filled 2015-04-17: qty 1

## 2015-04-17 MED ORDER — DEXAMETHASONE SODIUM PHOSPHATE 10 MG/ML IJ SOLN
10.0000 mg | Freq: Three times a day (TID) | INTRAMUSCULAR | Status: AC
  Administered 2015-04-17 – 2015-04-18 (×3): 10 mg via INTRAVENOUS
  Filled 2015-04-17 (×3): qty 1

## 2015-04-17 MED ORDER — HYDROCOD POLST-CPM POLST ER 10-8 MG/5ML PO SUER
5.0000 mL | Freq: Two times a day (BID) | ORAL | Status: DC | PRN
  Administered 2015-04-19 – 2015-04-22 (×4): 5 mL via ORAL
  Filled 2015-04-17 (×4): qty 5

## 2015-04-17 MED ORDER — TRAMADOL HCL 50 MG PO TABS: 50.0000 mg | ORAL_TABLET | Freq: Four times a day (QID) | ORAL | Status: DC | PRN

## 2015-04-17 MED ORDER — INSULIN ASPART 100 UNIT/ML ~~LOC~~ SOLN
0.0000 [IU] | Freq: Every day | SUBCUTANEOUS | Status: DC
  Administered 2015-04-18: 3 [IU] via SUBCUTANEOUS
  Filled 2015-04-17: qty 3
  Filled 2015-04-17: qty 11
  Filled 2015-04-17: qty 2

## 2015-04-17 MED ORDER — MORPHINE SULFATE (PF) 2 MG/ML IV SOLN
2.0000 mg | INTRAVENOUS | Status: DC | PRN
  Administered 2015-04-17 – 2015-04-19 (×3): 2 mg via INTRAVENOUS
  Filled 2015-04-17 (×3): qty 1

## 2015-04-17 MED ORDER — ENOXAPARIN SODIUM 40 MG/0.4ML ~~LOC~~ SOLN
40.0000 mg | SUBCUTANEOUS | Status: DC
  Administered 2015-04-17: 40 mg via SUBCUTANEOUS
  Filled 2015-04-17: qty 0.4

## 2015-04-17 MED ORDER — DEXAMETHASONE SODIUM PHOSPHATE 4 MG/ML IJ SOLN
12.0000 mg | Freq: Once | INTRAMUSCULAR | Status: AC
  Administered 2015-04-17: 12 mg via INTRAVENOUS
  Filled 2015-04-17: qty 3

## 2015-04-17 MED ORDER — AMIODARONE HCL IN DEXTROSE 360-4.14 MG/200ML-% IV SOLN
60.0000 mg/h | INTRAVENOUS | Status: AC
  Administered 2015-04-17: 60 mg/h via INTRAVENOUS
  Filled 2015-04-17 (×2): qty 200

## 2015-04-17 MED ORDER — AMIODARONE LOAD VIA INFUSION
150.0000 mg | Freq: Once | INTRAVENOUS | Status: AC
  Administered 2015-04-17: 150 mg via INTRAVENOUS
  Filled 2015-04-17: qty 83.34

## 2015-04-17 MED ORDER — VANCOMYCIN HCL IN DEXTROSE 1-5 GM/200ML-% IV SOLN
1000.0000 mg | Freq: Once | INTRAVENOUS | Status: AC
  Administered 2015-04-17: 1000 mg via INTRAVENOUS
  Filled 2015-04-17: qty 200

## 2015-04-17 MED ORDER — DILTIAZEM HCL 25 MG/5ML IV SOLN
INTRAVENOUS | Status: AC
  Administered 2015-04-17: 10 mg via INTRAVENOUS
  Filled 2015-04-17: qty 5

## 2015-04-17 MED ORDER — PIPERACILLIN-TAZOBACTAM 3.375 G IVPB
3.3750 g | Freq: Three times a day (TID) | INTRAVENOUS | Status: DC
  Filled 2015-04-17: qty 50

## 2015-04-17 MED ORDER — SODIUM CHLORIDE 0.9 % IV BOLUS (SEPSIS)
1000.0000 mL | INTRAVENOUS | Status: AC
  Administered 2015-04-17: 1000 mL via INTRAVENOUS

## 2015-04-17 MED ORDER — ONDANSETRON HCL 4 MG/2ML IJ SOLN: 4.0000 mg | Freq: Four times a day (QID) | INTRAMUSCULAR | Status: DC | PRN

## 2015-04-17 MED ORDER — CETYLPYRIDINIUM CHLORIDE 0.05 % MT LIQD
7.0000 mL | Freq: Two times a day (BID) | OROMUCOSAL | Status: DC
  Administered 2015-04-18 – 2015-04-19 (×4): 7 mL via OROMUCOSAL

## 2015-04-17 MED ORDER — BENZONATATE 100 MG PO CAPS
200.0000 mg | ORAL_CAPSULE | Freq: Three times a day (TID) | ORAL | Status: DC
  Administered 2015-04-17 – 2015-04-19 (×5): 200 mg via ORAL
  Filled 2015-04-17 (×6): qty 2

## 2015-04-17 MED ORDER — AMIODARONE HCL IN DEXTROSE 360-4.14 MG/200ML-% IV SOLN
30.0000 mg/h | INTRAVENOUS | Status: DC
  Administered 2015-04-18: 30 mg/h via INTRAVENOUS
  Filled 2015-04-17 (×8): qty 200

## 2015-04-17 NOTE — Progress Notes (Signed)
Now in afib RVR hr 120-140s cardizem '10mg'$  iv push ordered, may require gtt Will check echo as well

## 2015-04-17 NOTE — Progress Notes (Signed)
Called by nurse about patient's HR being in the 120's-130's.  Patient is still hypotensive.  -I will place a cardiology consult. Further treatment of A. fib as per cardiology.  Notified Nursing staff.

## 2015-04-17 NOTE — Progress Notes (Signed)
[place] Progress Note Patient Name: Ivan Beasley DOB: 11-12-1940 MRN: [id number]   Date of Service  04/17/2015  HPI/Events of Note  Patient in AFIB with Ventricular rate = 130's. Given Diltiazem 10 mg IV >> hypotension with SBP = 80's and patient c/o chest pain. BP now = 109/57 and chest pain improving.  Sat = 96% and RR = 26 on BiPAP.  eICU Interventions  Will order: 1. Amiodarone IV bolus and infusion. 2. Cycle Troponin. 3. BMP and Mg++ level now.      Intervention Category Major Interventions: Arrhythmia - evaluation and management;Hypotension - evaluation and management  Lysle Dingwall 04/17/2015, 7:06 PM

## 2015-04-17 NOTE — ED Notes (Addendum)
Pt to ed with c/o sob x 3 days.  Pt with noted and extreme difficulty breathing.  Pt family reports he had a treatment on Wednesday for cancer.  Pt with noted work of breathing.  Per family pt has had chills and fever.  Pt sats 88% on ra.  Pt anxious Pt taken straight to a room.

## 2015-04-17 NOTE — Progress Notes (Signed)
Patient went into afib RVR after coughing frequently.  Dr. Lavetta Nielsen paged.

## 2015-04-17 NOTE — Progress Notes (Signed)
Dr. Verdell Carmine notified about pt HR in 120-130 and Afib. He said he would put in a Cardiology consult. Pts creatinine elevated and Bp is low, systolic 87. Did not feel comfortable prescribing without Cardiology.

## 2015-04-17 NOTE — ED Notes (Signed)
Lactic acid level reported to MD.

## 2015-04-17 NOTE — Progress Notes (Signed)
RN notified Dr. Lavetta Nielsen that patient has been coughing frequently and is now in afib RVR rate 140's.  Dr. Lavetta Nielsen stated he would order IV cardizem push now.  Report given to Adin Hector, RN who is now taking over patient's care.

## 2015-04-17 NOTE — Progress Notes (Signed)
trialed patient off bipap. He request to come off mask for a while. Family at bedside. Placed patient back on 3 liter nasal cannula per earlier setting. Seems to be tolerating well. Will continue to monitor

## 2015-04-17 NOTE — H&P (Signed)
[name] NAME: Ivan Beasley    MR#:  [id number]  DATE OF BIRTH:  [birth year]   DATE OF ADMISSION:  04/17/2015  PRIMARY CARE PHYSICIAN: Albina Billet, MD   REQUESTING/REFERRING PHYSICIAN: Scha[place]itz  CHIEF COMPLAINT:   Chief Complaint  Patient presents with  . Shortness of Breath    HISTORY OF PRESENT ILLNESS:  Ivan Beasley  is a 75 y.o. 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 History limited through patient given current BiPAP treatment assisted by wife present at bedside. This is the sixth cycle received 04/14/15. Since that time has been experiencing diffuse watery diarrhea (normal side effects for him) increased fatigue, weakness, dry nonproductive cough, shortness of breath, pleuritic type chest pain. Of note recently seen by his oncologist prior to this and treated for pneumonia with Levaquin. Of chest x-ray findings slightly increased left hilar interstitial infiltrate. He also noted temperature 100.31F at home which has since resolved. Emergency department course: Acute respiratory distress on arrival unable to speak in full sentences with SaO2 mid 80s also noted to be hypotensive. He received IV fluid hydration as well as placed on BiPAP with improvement and stabilization of symptoms.  PAST MEDICAL HISTORY:   Past Medical History  Diagnosis Date  . Coronary artery disease   . Pneumonia 2000  . Arthritis   . 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 Bruises easily   . GERD (gastroesophageal reflux disease)     takes Omeprazole daily as needed for stomach pain  . Blood transfusion 2001  . Diabetes mellitus     takes Metformin daily;  . Impaired hearing     bil hearing aide  . Macular degeneration     being watched for this but hasn't been "completely" diagnosed  . Myocardial infarction ([place]) 2001  . Cancer [facility])     PAST  SURGICAL HISTORY:   Past Surgical History  Procedure Laterality Date  . 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 Cardiac catheterization  2001  . Tonsillectomy      as a child  . Colonoscopy    . Cataract extraction      bilateral  . Lumbar laminectomy/decompression microdiscectomy  12/19/2010    Procedure: LUMBAR LAMINECTOMY/DECOMPRESSION MICRODISCECTOMY;  Surgeon: Elaina Hoops;  Location: [facility];  Service: Neurosurgery;  Laterality: N/A;  Lumbar three-four, four-five decompressive lumbar laminectomy  . Joint replacement  right tkr  . Eye surgery      cataracts  . Back surgery      l4 5  . Portacath placement N/A 05/20/2014    Procedure: INSERTION PORT-A-CATH;  Surgeon: Nestor Lewandowsky, MD;  Location: [facility] ORS;  Service: General;  Laterality: N/A;    SOCIAL HISTORY:   Social History  Substance Use Topics  . Smoking status: Former Smoker -- 40 years    Types: Cigarettes    Quit date: 05/19/1999  . Smokeless tobacco: Never Used     Comment: 2001  . Alcohol Use: No    FAMILY HISTORY:   Family History  Problem Relation Age of Onset  . Anesthesia problems Neg Hx   . Hypotension Neg Hx   . Malignant hyperthermia Neg Hx   . Pseudochol deficiency Neg Hx   . Diabetes type II Other     DRUG ALLERGIES:  No Known Allergies  REVIEW OF SYSTEMS:  REVIEW OF SYSTEMS:  CONSTITUTIONAL: Positive fevers, chills, fatigue, weakness.  EYES: Denies blurred vision, double vision, or eye pain.  EARS, NOSE, THROAT: Denies tinnitus, ear pain, hearing loss.  RESPIRATORY: Positive cough, shortness of breath, denies wheezing  CARDIOVASCULAR: Denies chest pain, palpitations, edema.  GASTROINTESTINAL: Denies nausea, vomiting, diarrhea, abdominal pain.  GENITOURINARY: Denies dysuria, hematuria.  ENDOCRINE: Denies nocturia or thyroid problems. HEMATOLOGIC AND LYMPHATIC: Denies easy bruising or bleeding.  SKIN: Denies rash or lesions.  MUSCULOSKELETAL: Denies pain in neck, back,  shoulder, knees, hips, or further arthritic symptoms.  NEUROLOGIC: Denies paralysis, paresthesias.  PSYCHIATRIC: Denies anxiety or depressive symptoms. Otherwise full review of systems performed by me is negative.   MEDICATIONS AT HOME:   Prior to Admission medications   Medication Sig Start Date End Date Taking? Authorizing Provider  albuterol (PROVENTIL HFA;VENTOLIN HFA) 108 (90 BASE) MCG/ACT inhaler Inhale 2 puffs into the lungs as needed for wheezing or shortness of breath.     Historical Provider, MD  Alcohol Swabs (B-D SINGLE USE SWABS REGULAR) PADS  11/10/13   Historical Provider, MD  Celedonio Miyamoto 62.5-25 MCG/INH AEPB  03/06/14   Historical Provider, MD  aspirin 81 MG tablet  04/01/14   Historical Provider, MD  benzonatate (TESSALON) 100 MG capsule TAKE 1 CAPSULE (100 MG TOTAL) BY MOUTH 3 (THREE) TIMES DAILY AS NEEDED FOR COUGH. 01/12/15   Forest Gleason, MD  beta carotene w/minerals (OCUVITE) tablet Take 1 tablet by mouth 2 (two) times daily.    Historical Provider, MD  bismuth subsalicylate (KAOPECTATE) 262 MG/15ML suspension Take 30 mLs by mouth 3 (three) times daily as needed. 10/28/14   Forest Gleason, MD  Blood Glucose Calibration (TRUETEST CONTROL LEVEL 1) LIQD  11/10/13   Historical Provider, MD  chlorpheniramine-HYDROcodone (TUSSIONEX PENNKINETIC ER) 10-8 MG/5ML SUER Take 5 mLs by mouth every 12 (twelve) hours as needed for cough. 01/12/15   Forest Gleason, MD  diphenoxylate-atropine (LOMOTIL) 2.5-0.025 MG tablet TAKE 1 TABLET EVERY 4 HOURS AS NEEDED FOR DIARRHEA /LOOSE STOOL 04/16/15   Lloyd Huger, MD  fluconazole (DIFLUCAN) 100 MG tablet Take 1 tablet (100 mg total) by mouth daily. 04/16/15   Lloyd Huger, MD  glimepiride (AMARYL) 1 MG tablet  02/06/14   Historical Provider, MD  glucose blood test strip  11/10/13   Historical Provider, MD  guaiFENesin (MUCINEX) 600 MG 12 hr tablet Take 1 tablet (600 mg total) by mouth 2 (two) times daily. 03/09/15   Forest Gleason, MD  Lancet Devices  (TRUEDRAW LANCING DEVICE) Jal  11/10/13   Historical Provider, MD  levofloxacin (LEVAQUIN) 500 MG tablet Take 1 tablet (500 mg total) by mouth daily. 03/10/15   Forest Gleason, MD  lidocaine-prilocaine (EMLA) cream  05/21/14   Historical Provider, MD  magnesium oxide (MAG-OX) 400 MG tablet Take 400 mg by mouth daily.      Historical Provider, MD  Melatonin 5 MG CAPS Take 1 capsule (5 mg total) by mouth at bedtime as needed. 06/03/14   Forest Gleason, MD  metFORMIN (GLUCOPHAGE) 500 MG tablet Take 500 mg by mouth daily. Take '1000mg'$  po in morning, take '500mg'$  po at bedtime    Historical Provider, MD  metoprolol (LOPRESSOR) 50 MG tablet daily. 50 mg 2 (two) times daily. 06/13/13   Historical Provider, MD  naproxen (NAPROSYN) 500 MG tablet  04/27/14   Historical Provider, MD  omeprazole (PRILOSEC) 40 MG capsule Take 40 mg by mouth daily as needed. For acid reflux  Historical Provider, MD  ondansetron (ZOFRAN) 8 MG tablet Take 1 tablet (8 mg total) by mouth 2 (two) times daily. Start the day after chemo for 3 days. Then take as needed for nausea or vomiting. 05/21/14   Forest Gleason, MD  oxyCODONE (ROXICODONE) 5 MG immediate release tablet Take 1 tablet (5 mg total) by mouth every 6 (six) hours as needed for severe pain. 10/14/14   Forest Gleason, MD  simvastatin (ZOCOR) 40 MG tablet Take 40 mg by mouth at bedtime.      Historical Provider, MD  [facility] 160-4.5 MCG/ACT inhaler  04/21/14   Historical Provider, MD  traMADol Veatrice Bourbon) 50 MG tablet  03/30/15   Historical Provider, MD  TRUEPLUS LANCETS 28G [place]  11/10/13   Historical Provider, MD      VITAL SIGNS:  Blood pressure 112/56, pulse 100, temperature 97.8 F (36.6 C), temperature source Oral, resp. rate 27, weight 97.977 kg (216 lb), SpO2 97 %.  PHYSICAL EXAMINATION:  VITAL SIGNS: Filed Vitals:   04/17/15 1330 04/17/15 1345  BP: 110/62 112/56  Pulse: 101 100  Temp:    Resp: 28 89   GENERAL:74 y.o.male currently in moderate acute distress. Given  respiratory status currently on BiPAP HEAD: Normocephalic, atraumatic.  EYES: Pupils equal, round, reactive to light. Extraocular muscles intact. No scleral icterus.  MOUTH: Dry mucosal membrane. Dentition intact. No abscess noted.  EAR, NOSE, THROAT: Clear without exudates. No external lesions.  NECK: Supple. No thyromegaly. No nodules. No JVD.  PULMONARY: Diffuse coarse/diminished breath sounds without wheeze Requiring BiPAP therapy No use of accessory muscles, poor respiratory effort. Poor air entry bilaterally CHEST: Nontender to palpation.  CARDIOVASCULAR: S1 and S2. Regular rate and rhythm. No murmurs, rubs, or gallops. No edema. Pedal pulses 2+ bilaterally.  GASTROINTESTINAL: Soft, nontender, nondistended. No masses. Positive bowel sounds. No hepatosplenomegaly.  MUSCULOSKELETAL: No swelling, clubbing, or edema. Range of motion full in all extremities.  NEUROLOGIC: Cranial nerves II through XII are intact. No gross focal neurological deficits. Sensation intact. Reflexes intact.  SKIN: No ulceration, lesions, rashes, or cyanosis. Skin warm and dry. Turgor intact.  PSYCHIATRIC: Mood, affect within normal limits. The patient is awake, alert and oriented x 3. Insight, judgment intact.    LABORATORY PANEL:   CBC  Recent Labs Lab 04/17/15 1205  WBC 8.4  HGB 10.0*  HCT 30.3*  PLT 154   ------------------------------------------------------------------------------------------------------------------  Chemistries   Recent Labs Lab 04/14/15 0836 04/14/15 0857 04/17/15 1205  NA 131*  --  130*  K 4.0  --  4.7  CL 105  --  102  CO2 19*  --  15*  GLUCOSE 143*  --  112*  BUN 21*  --  55*  CREATININE 1.27*  --  3.59*  CALCIUM 8.2*  --  8.3*  MG  --  1.9  --   AST 29  --   --   ALT 21  --   --   ALKPHOS 31*  --   --   BILITOT 0.5  --   --    ------------------------------------------------------------------------------------------------------------------  Cardiac  Enzymes  Recent Labs Lab 04/17/15 1205  TROPONINI <0.03   ------------------------------------------------------------------------------------------------------------------  RADIOLOGY:  Dg Chest 1 View  04/17/2015  CLINICAL DATA:  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 EXAM: Portable CHEST 1 VIEW COMPARISON:  03/10/2015 and earlier, including CT chest 01/05/2015 and earlier and PET-CT 09/10/2014. FINDINGS: Since the chest x-ray 5 weeks ago, development of confluent airspace opacities through out the left lung, associated with volume loss and mediastinal shift to the left. Suboptimal inspiration accounts for mild right basilar atelectasis. Right lung otherwise clear. Prior sternotomy for CABG. Cardiac silhouette normal in size, unchanged. Right jugular Port-A-Cath tip projects over the lower SVC, unchanged. IMPRESSION: 1. Likely postobstructive atelectasis and pneumonia involving the entire left lung, new since a chest x-ray 5 weeks ago. 2. Suboptimal inspiration accounts for mild right basilar atelectasis. Right lung otherwise clear. Electronically Signed   By: Evangeline Dakin M.D.   On: 04/17/2015 12:30    EKG:   Orders placed or performed in visit on 04/17/15  . 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 Jeb Levering presenting in respiratory distress.  1. Acute on chronic respiratory failure with hypoxia: Patient admitted to stepdown unit BiPAP therapy wean to a high flow nasal cannula as tolerated continue supplemental oxygen keep SaO2 greater than 92%. Possible pneumonitis reaction secondary to nivolumab-continue IV steroids oncology recommendations 10 mg Decadron every 8 hours for 24 hours.  Given infection cannot be completely ruled out Artie started on IV antibiotics and emergency department follow culture data and adjust antibiotics accordingly if repeat chest x-ray shows fast resolution this would not be indicative of infection. Will not order pro-calcitonin given active cancer treatment  2. Type 2 diabetes non-insulin-requiring hold oral agents add insulin sliding scale particularly in setting of steroids 3. Stage III squamous cell carcinoma lung consult oncology 4. Acute renal failure: IV fluid hydration follow urine output renal function avoid further nephrotoxic agents if no improvement check renal ultrasound 5. Venous thromboembolism prophylactic: Heparin subcutaneous     All the records are reviewed and case discussed with ED provider and PCCM. Management plans discussed with the patient, family and they are in agreement.  CODE STATUS: Full  TOTAL TIME TAKING CARE OF THIS PATIENT: 45 critical care minutes.    Doc Mandala,  Karenann Cai.D on 04/17/2015 at 1:58 PM  Between 7am to 6pm - Pager - [phone]  After 6pm: House Pager: - [facility]  Office  [phone]  CC: Primary care physician; Albina Billet, MD

## 2015-04-17 NOTE — Consult Note (Addendum)
PULMONARY / CRITICAL CARE MEDICINE   Name: Ivan Beasley MRN: [id number] DOB: 09/20/1940    ADMISSION DATE:  04/17/2015 CONSULTATION DATE:  04/17/15  REFERRING MD :  Dr. Clearnce Hasten (ER)   CHIEF COMPLAINT:     Shortness of breath   HISTORY OF PRESENT ILLNESS   75 year old male past medical history of stage III squamous cell lung cancer status post chemotherapy and radiation therapy, now receiving immunotherapy for left hilar adenopathy, presents with shortness of breath 3 days along with fever and chills. Past medical history also includes coronary artery disease, recent treatment for pneumonia with Levaquin 7 days, arthritis, chronic back pain, diabetes with macular degeneration and history of myocardial infarction. She per patient and wife at the bedside. Patient received a dose of immunotherapy on 04/14/2015, shortly afterwards started experiencing fever and chills on Wednesday night, along with mild shortness of breath, this morning had an 10 shortness of breath weakness and altered mental status, brought to the ER, found to have saturations in the low 80s, and placed on BiPAP, significant improvement in mentation and oxygenation. Chest x-ray showed left-sided pneumonitis with possible infiltrate versus atelectasis in the left base    SIGNIFICANT EVENTS  03/10/15 - follow-up with hematology/oncology, repeat chest x-ray showed possible acute superimposed pneumonia in the left hilum, treated with Levaquin 7 days 04/17/15 - increasing shortness of breath, pneumonitis with left basilar atelectasis/infiltrate, admitted to stepdown unit, requiring BiPAP/HFNC, PCCM consulted.     PAST MEDICAL HISTORY    :  Past Medical History  Diagnosis Date  . Coronary artery disease   . Pneumonia 2000  . Arthritis   . 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 Bruises easily   . GERD (gastroesophageal reflux disease)     takes Omeprazole daily as needed for stomach pain  . Blood  transfusion 2001  . Diabetes mellitus     takes Metformin daily;  . Impaired hearing     bil hearing aide  . Macular degeneration     being watched for this but hasn't been "completely" diagnosed  . Myocardial infarction ([place]) 2001  . Cancer [facility])    Past Surgical History  Procedure Laterality Date  . 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 Cardiac catheterization  2001  . Tonsillectomy      as a child  . Colonoscopy    . Cataract extraction      bilateral  . Lumbar laminectomy/decompression microdiscectomy  12/19/2010    Procedure: LUMBAR LAMINECTOMY/DECOMPRESSION MICRODISCECTOMY;  Surgeon: Elaina Hoops;  Location: [place] [facility] ORS;  Service: Neurosurgery;  Laterality: N/A;  Lumbar three-four, four-five decompressive lumbar laminectomy  . Joint replacement  right tkr  . Eye surgery      cataracts  . Back surgery      l4 5  . Portacath placement N/A 05/20/2014    Procedure: INSERTION PORT-A-CATH;  Surgeon: Nestor Lewandowsky, MD;  Location: [facility] ORS;  Service: General;  Laterality: N/A;   Prior to Admission medications   Medication Sig Start Date End Date Taking? Authorizing Provider  albuterol (PROVENTIL HFA;VENTOLIN HFA) 108 (90 BASE) MCG/ACT inhaler Inhale 2 puffs into the lungs as needed for wheezing or shortness of breath.     Historical Provider, MD  Alcohol Swabs (B-D SINGLE USE SWABS REGULAR) PADS  11/10/13   Historical Provider, MD  Celedonio Miyamoto 62.5-25 MCG/INH AEPB  03/06/14   Historical Provider, MD  aspirin 81 MG tablet  04/01/14   Historical Provider, MD  benzonatate (TESSALON) 100 MG capsule TAKE 1 CAPSULE (100 MG TOTAL) BY MOUTH 3 (THREE) TIMES DAILY AS NEEDED FOR COUGH. 01/12/15   Forest Gleason, MD  beta carotene w/minerals (OCUVITE) tablet Take 1 tablet by mouth 2 (two) times daily.    Historical Provider, MD  bismuth subsalicylate (KAOPECTATE) 262 MG/15ML suspension Take 30 mLs by mouth 3 (three) times daily as needed. 10/28/14   Forest Gleason, MD  Blood Glucose  Calibration (TRUETEST CONTROL LEVEL 1) LIQD  11/10/13   Historical Provider, MD  chlorpheniramine-HYDROcodone (TUSSIONEX PENNKINETIC ER) 10-8 MG/5ML SUER Take 5 mLs by mouth every 12 (twelve) hours as needed for cough. 01/12/15   Forest Gleason, MD  diphenoxylate-atropine (LOMOTIL) 2.5-0.025 MG tablet TAKE 1 TABLET EVERY 4 HOURS AS NEEDED FOR DIARRHEA /LOOSE STOOL 04/16/15   Lloyd Huger, MD  fluconazole (DIFLUCAN) 100 MG tablet Take 1 tablet (100 mg total) by mouth daily. 04/16/15   Lloyd Huger, MD  glimepiride (AMARYL) 1 MG tablet  02/06/14   Historical Provider, MD  glucose blood test strip  11/10/13   Historical Provider, MD  guaiFENesin (MUCINEX) 600 MG 12 hr tablet Take 1 tablet (600 mg total) by mouth 2 (two) times daily. 03/09/15   Forest Gleason, MD  Lancet Devices (TRUEDRAW LANCING DEVICE) [place]  11/10/13   Historical Provider, MD  levofloxacin (LEVAQUIN) 500 MG tablet Take 1 tablet (500 mg total) by mouth daily. 03/10/15   Forest Gleason, MD  lidocaine-prilocaine (EMLA) cream  05/21/14   Historical Provider, MD  magnesium oxide (MAG-OX) 400 MG tablet Take 400 mg by mouth daily.      Historical Provider, MD  Melatonin 5 MG CAPS Take 1 capsule (5 mg total) by mouth at bedtime as needed. 06/03/14   Forest Gleason, MD  metFORMIN (GLUCOPHAGE) 500 MG tablet Take 500 mg by mouth daily. Take '1000mg'$  po in morning, take '500mg'$  po at bedtime    Historical Provider, MD  metoprolol (LOPRESSOR) 50 MG tablet daily. 50 mg 2 (two) times daily. 06/13/13   Historical Provider, MD  naproxen (NAPROSYN) 500 MG tablet  04/27/14   Historical Provider, MD  omeprazole (PRILOSEC) 40 MG capsule Take 40 mg by mouth daily as needed. For acid reflux      Historical Provider, MD  ondansetron (ZOFRAN) 8 MG tablet Take 1 tablet (8 mg total) by mouth 2 (two) times daily. Start the day after chemo for 3 days. Then take as needed for nausea or vomiting. 05/21/14   Forest Gleason, MD  oxyCODONE (ROXICODONE) 5 MG immediate release tablet Take  1 tablet (5 mg total) by mouth every 6 (six) hours as needed for severe pain. 10/14/14   Forest Gleason, MD  simvastatin (ZOCOR) 40 MG tablet Take 40 mg by mouth at bedtime.      Historical Provider, MD  [facility] 160-4.5 MCG/ACT inhaler  04/21/14   Historical Provider, MD  traMADol Veatrice Bourbon) 50 MG tablet  03/30/15   Historical Provider, MD  TRUEPLUS LANCETS 28G [place]  11/10/13   Historical Provider, MD   No Known Allergies   FAMILY HISTORY   Family History  Problem Relation Age of Onset  . Anesthesia problems Neg Hx   . Hypotension Neg Hx   . Malignant hyperthermia Neg Hx   . Pseudochol deficiency Neg Hx       SOCIAL HISTORY    reports that he quit smoking about 15 years ago. His smoking use included Cigarettes. He quit after 40  years of use. He has never used smokeless tobacco. He reports that he does not drink alcohol or use illicit drugs.  Review of Systems  Constitutional: Positive for fever, chills, malaise/fatigue and diaphoresis. Negative for weight loss.  HENT: Negative for ear discharge and ear pain.   Eyes: Negative for blurred vision and double vision.  Respiratory: Positive for cough, sputum production, shortness of breath and wheezing. Negative for hemoptysis.   Cardiovascular: Negative for chest pain, palpitations, orthopnea, claudication and leg swelling.  Gastrointestinal: Negative for heartburn, nausea and abdominal pain.  Genitourinary: Negative for dysuria.  Musculoskeletal: Negative for myalgias.  Skin: Negative for itching and rash.  Neurological: Positive for weakness. Negative for headaches.  Endo/Heme/Allergies: Does not bruise/bleed easily.  Psychiatric/Behavioral: Negative for depression.      VITAL SIGNS    Temp:  [97.8 F (36.6 C)] 97.8 F (36.6 C) (04/08 1214) Pulse Rate:  [109-115] 115 (04/08 1215) Resp:  [26-29] 27 (04/08 1215) BP: (74-99)/(59-82) 88/72 mmHg (04/08 1215) SpO2:  [88 %-100 %] 97 % (04/08 1235) Weight:  [216 lb (97.977 kg)] 216  lb (97.977 kg) (04/08 1141) HEMODYNAMICS:   VENTILATOR SETTINGS:   INTAKE / OUTPUT: No intake or output data in the 24 hours ending 04/17/15 1322     PHYSICAL EXAM   Physical Exam  Constitutional: He is oriented to person, place, and time. He appears well-developed and well-nourished.  HENT:  Head: Normocephalic and atraumatic.  Right Ear: External ear normal.  Left Ear: External ear normal.  Nose: Nose normal.  Mouth/Throat: Oropharynx is clear and moist.  Eyes: Conjunctivae and EOM are normal. Pupils are equal, round, and reactive to light.  Neck: Normal range of motion. Neck supple.  Cardiovascular: Normal rate, regular rhythm, normal heart sounds and intact distal pulses.   No murmur heard. Pulmonary/Chest: He is in respiratory distress. He has rales. He exhibits no tenderness.  Mild respiratory distress, on bipap, no use of ICs.   Abdominal: Soft. Bowel sounds are normal. He exhibits no distension.  Musculoskeletal: Normal range of motion. He exhibits no edema.  Neurological: He is alert and oriented to person, place, and time.  Skin: Skin is warm and dry.  Psychiatric: He has a normal mood and affect.       LABS   LABS:  CBC  Recent Labs Lab 04/14/15 0836 04/17/15 1205  WBC 5.8 8.4  HGB 10.8* 10.0*  HCT 32.8* 30.3*  PLT 162 154   Coag's No results for input(s): APTT, INR in the last 168 hours. BMET  Recent Labs Lab 04/14/15 0836  NA 131*  K 4.0  CL 105  CO2 19*  BUN 21*  CREATININE 1.27*  GLUCOSE 143*   Electrolytes  Recent Labs Lab 04/14/15 0836 04/14/15 0857  CALCIUM 8.2*  --   MG  --  1.9   Sepsis Markers  Recent Labs Lab 04/17/15 1206  LATICACIDVEN 6.4*   ABG No results for input(s): PHART, PCO2ART, PO2ART in the last 168 hours. Liver Enzymes  Recent Labs Lab 04/14/15 0836  AST 29  ALT 21  ALKPHOS 31*  BILITOT 0.5  ALBUMIN 3.5   Cardiac Enzymes No results for input(s): TROPONINI, PROBNP in the last 168  hours. Glucose No results for input(s): GLUCAP in the last 168 hours.   No results found for this or any previous visit (from the past 240 hour(s)).   Current facility-administered medications:  .  0.9 %  sodium chloride infusion, , Intravenous, Continuous, [name], MD .  acetaminophen (TYLENOL) tablet 650 mg, 650 mg, Oral, Q6H PRN **OR** acetaminophen (TYLENOL) suppository 650 mg, 650 mg, Rectal, Q6H PRN, [name], MD .  aztreonam (AZACTAM) 2 g in dextrose 5 % 50 mL IVPB, 2 g, Intravenous, Once, Orbie Pyo, MD, Last Rate: 100 mL/hr at 04/17/15 1259, 2 g at 04/17/15 1259 .  enoxaparin (LOVENOX) injection 40 mg, 40 mg, Subcutaneous, Q24H, [name], MD .  ipratropium-albuterol (DUONEB) 0.5-2.5 (3) MG/3ML nebulizer solution 3 mL, 3 mL, Nebulization, Q4H, [name], MD .  morphine 2 MG/ML injection 2 mg, 2 mg, Intravenous, Q4H PRN, [name], MD .  ondansetron [facility]) tablet 4 mg, 4 mg, Oral, Q6H PRN **OR** ondansetron (ZOFRAN) injection 4 mg, 4 mg, Intravenous, Q6H PRN, [name], MD .  oxyCODONE (Oxy IR/ROXICODONE) immediate release tablet 5 mg, 5 mg, Oral, Q4H PRN, [name], MD .  sodium chloride 0.9 % bolus 1,000 mL, 1,000 mL, Intravenous, Q1H, Orbie Pyo, MD, Last Rate: 1,000 mL/hr at 04/17/15 1252, 1,000 mL at 04/17/15 1252 .  sodium chloride flush (NS) 0.9 % injection 3 mL, 3 mL, Intravenous, Q12H, [name], MD .  vancomycin (VANCOCIN) IVPB 1000 mg/200 mL premix, 1,000 mg, Intravenous, Once, Orbie Pyo, MD  Current outpatient prescriptions:  .  albuterol (PROVENTIL HFA;VENTOLIN HFA) 108 (90 BASE) MCG/ACT inhaler, Inhale 2 puffs into the lungs as needed for wheezing or shortness of breath. , Disp: , Rfl:  .  Alcohol Swabs (B-D SINGLE USE SWABS REGULAR) PADS, , Disp: , Rfl:  .  ANORO ELLIPTA 62.5-25 MCG/INH AEPB, , Disp: , Rfl: 11 .  aspirin 81 MG tablet, , Disp: , Rfl:  .  benzonatate (TESSALON) 100 MG capsule,  TAKE 1 CAPSULE (100 MG TOTAL) BY MOUTH 3 (THREE) TIMES DAILY AS NEEDED FOR COUGH., Disp: 90 capsule, Rfl: 3 .  beta carotene w/minerals (OCUVITE) tablet, Take 1 tablet by mouth 2 (two) times daily., Disp: , Rfl:  .  bismuth subsalicylate (KAOPECTATE) 262 MG/15ML suspension, Take 30 mLs by mouth 3 (three) times daily as needed., Disp: 360 mL, Rfl: 0 .  Blood Glucose Calibration (TRUETEST CONTROL LEVEL 1) LIQD, , Disp: , Rfl:  .  chlorpheniramine-HYDROcodone (TUSSIONEX PENNKINETIC ER) 10-8 MG/5ML SUER, Take 5 mLs by mouth every 12 (twelve) hours as needed for cough., Disp: 140 mL, Rfl: 0 .  diphenoxylate-atropine (LOMOTIL) 2.5-0.025 MG tablet, TAKE 1 TABLET EVERY 4 HOURS AS NEEDED FOR DIARRHEA /LOOSE STOOL, Disp: 45 tablet, Rfl: 1 .  fluconazole (DIFLUCAN) 100 MG tablet, Take 1 tablet (100 mg total) by mouth daily., Disp: 5 tablet, Rfl: 0 .  glimepiride (AMARYL) 1 MG tablet, , Disp: , Rfl:  .  glucose blood test strip, , Disp: , Rfl:  .  guaiFENesin (MUCINEX) 600 MG 12 hr tablet, Take 1 tablet (600 mg total) by mouth 2 (two) times daily., Disp: 60 tablet, Rfl: 2 .  Lancet Devices (TRUEDRAW LANCING DEVICE) MISC, , Disp: , Rfl:  .  levofloxacin (LEVAQUIN) 500 MG tablet, Take 1 tablet (500 mg total) by mouth daily., Disp: 7 tablet, Rfl: 0 .  lidocaine-prilocaine (EMLA) cream, , Disp: , Rfl:  .  magnesium oxide (MAG-OX) 400 MG tablet, Take 400 mg by mouth daily.  , Disp: , Rfl:  .  Melatonin 5 MG CAPS, Take 1 capsule (5 mg total) by mouth at bedtime as needed., Disp: , Rfl: 0 .  metFORMIN (GLUCOPHAGE) 500 MG tablet, Take 500 mg by mouth  daily. Take '1000mg'$  po in morning, take '500mg'$  po at bedtime, Disp: , Rfl:  .  metoprolol (LOPRESSOR) 50 MG tablet, daily. 50 mg 2 (two) times daily., Disp: , Rfl:  .  naproxen (NAPROSYN) 500 MG tablet, , Disp: , Rfl:  .  omeprazole (PRILOSEC) 40 MG capsule, Take 40 mg by mouth daily as needed. For acid reflux  , Disp: , Rfl:  .  ondansetron (ZOFRAN) 8 MG tablet, Take 1  tablet (8 mg total) by mouth 2 (two) times daily. Start the day after chemo for 3 days. Then take as needed for nausea or vomiting., Disp: 30 tablet, Rfl: 1 .  oxyCODONE (ROXICODONE) 5 MG immediate release tablet, Take 1 tablet (5 mg total) by mouth every 6 (six) hours as needed for severe pain., Disp: 30 tablet, Rfl: 0 .  simvastatin (ZOCOR) 40 MG tablet, Take 40 mg by mouth at bedtime.  , Disp: , Rfl:  .  SYMBICORT 160-4.5 MCG/ACT inhaler, , Disp: , Rfl: 12 .  traMADol (ULTRAM) 50 MG tablet, , Disp: , Rfl:  .  TRUEPLUS LANCETS 28G MISC, , Disp: , Rfl:   Facility-Administered Medications Ordered in Other Encounters:  .  0.9 %  sodium chloride infusion, , Intravenous, Continuous, Forest Gleason, MD, Stopped at 12/11/14 1034 .  lidocaine-prilocaine (EMLA) cream, , Topical, Once, Evlyn Kanner, NP .  sodium chloride 0.9 % injection 10 mL, 10 mL, Intracatheter, PRN, Forest Gleason, MD, 10 mL at 06/03/14 1106 .  sodium chloride 0.9 % injection 10 mL, 10 mL, Intravenous, PRN, Forest Gleason, MD, 10 mL at 07/30/14 0920 .  sodium chloride 0.9 % injection 10 mL, 10 mL, Intracatheter, PRN, Forest Gleason, MD, 10 mL at 12/11/14 0912  IMAGING    Dg Chest 1 View  04/17/2015  CLINICAL DATA:  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 EXAM: Portable CHEST 1 VIEW COMPARISON:  03/10/2015 and earlier, including CT chest 01/05/2015 and earlier and PET-CT 09/10/2014. FINDINGS: Since the chest x-ray 5 weeks ago, development of confluent airspace opacities through out the left lung, associated with volume loss and mediastinal shift to the left. Suboptimal inspiration accounts for mild right basilar atelectasis. Right lung otherwise clear. Prior sternotomy for CABG. Cardiac silhouette normal in size, unchanged. Right  jugular Port-A-Cath tip projects over the lower SVC, unchanged. IMPRESSION: 1. Likely postobstructive atelectasis and pneumonia involving the entire left lung, new since a chest x-ray 5 weeks ago. 2. Suboptimal inspiration accounts for mild right basilar atelectasis. Right lung otherwise clear. Electronically Signed   By: Evangeline Dakin M.D.   On: 04/17/2015 12:30      Indwelling Urinary Catheter continued, requirement due to   Reason to continue Indwelling Urinary Catheter for strict Intake/Output monitoring for hemodynamic instability   Central Line continued, requirement due to   Reason to continue [employer] Monitoring of central venous pressure or other hemodynamic parameters   Ventilator continued, requirement due to, resp failure    Ventilator Sedation RASS 0 to -2   Cultures: BCx2  UC  Sputum  Antibiotics: Vanc/Zosyn 4/8>>  Lines:   ASSESSMENT/PLAN  Every 72-year-old with stage III squamous cell lung cancer status post chemotherapy and radiation, with persistent left hilar adenopathy, being treated with immunotherapy, now with left-sided pneumonitis and atelectasis  PULMONARY Acute respiratory failure-hypoxia Pneumonitis Left  basilar atelectasis/Partial L lung collapse Possible pneumonia and left base COPD, mild - follow with Dr. Raul Del  P:   -Continue with BiPAP as needed, can transition to high flow nasal cannula -Patient with significant improvement on BiPAP in the ER, can admit to stepdown unit by hospitalist -Chest x-ray reviewed, pneumonitis type picture possibly related to infection versus immunotherapy along with partial basilar left lung atelectasis/collapse. Continue with IV steroids and empiric anabiotic's -Review of records show that he was recently treated for a left hilar infiltrate with 7 days of Levaquin, in early March -Check sputum culture -Check influenza PCR -Check blood culture -Maintain O2 saturations greater than 88% -Incentive  spirometry -Chest physiotherapy -repeat chest x-ray in morning -Scheduled duo nebs every 6 hours for 24 hours, and then as needed for shortness of breath and wheezing -Transfer back to Dr. Raul Del service once out of stepdown unit  CARDIOVASCULAR -No acute issues -Continue with hemodynamic monitoring  RENAL Chronic renal insufficiency P:   -Monitor urine output -Avoid nephrotoxic drugs -Gentle IV fluids as needed  GASTROINTESTINAL SUP - PPI  HEMATOLOGIC Anemia of Chronic illness P: - monitor CBC  INFECTIOUS Pneumonitis L basilar inflitrate Atelectasis P:   - abx as stated above  ENDOCRINE DM P:   - SSI  NEUROLOGIC RASS goal: 0   I have personally obtained a history, examined the patient, evaluated laboratory and imaging results, formulated the assessment and plan and placed orders.  The Patient requires high complexity decision making for assessment and support, frequent evaluation and titration of therapies, application of advanced monitoring technologies and extensive interpretation of multiple databases.   Pulmonary Care Time devoted to patient care services described in this note is 40 minutes.   Vilinda Boehringer, MD [facility] Pager [phone] (please enter 7-digits) On Call Pager [phone] (please enter 7-digits)     04/17/2015, 1:22 PM  Note: This note was prepared with Dragon dictation along with smaller phrase technology. Any transcriptional errors that result from this process are unintentional.   c

## 2015-04-17 NOTE — Progress Notes (Signed)
Patient received from ED on Bipap 10/5 30% in no distress.  Patient remains on Bipap at this time.

## 2015-04-17 NOTE — ED Provider Notes (Signed)
[facility] Emergency Department Provider Note  ____________________________________________  Time seen: Seen upon arrival to the treatment area  I have reviewed the triage vital signs and the nursing notes.   HISTORY  Chief Complaint Shortness of Breath   HPI Ivan Beasley is a 75 y.o. male with a history of lung cancer who is presenting with progressively worsening shortness of breath and gurgling sound when he breathes. His wife said he had his last infusion of chemotherapy this past Wednesday. He began having shortness of breath soon after and then last night started to develop a gurgling noise. They talked with her oncologist, Dr. Grayland Ormond, this morning who advised them to come in to the emergency department. The patient is denying any pain at this time. His wife says that he also a temperature to 100.3 on Wednesday after the infusion but it is since been steadily at 99 even thereafter.   Past Medical History  Diagnosis Date  . Coronary artery disease   . Pneumonia 2000  . Arthritis   . 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 Bruises easily   . GERD (gastroesophageal reflux disease)     takes Omeprazole daily as needed for stomach pain  . Blood transfusion 2001  . Diabetes mellitus     takes Metformin daily;  . Impaired hearing     bil hearing aide  . Macular degeneration     being watched for this but hasn't been "completely" diagnosed  . Myocardial infarction ([place]) 2001  . Cancer [facility])     Patient Active Problem List   Diagnosis Date Noted  . Cancer of lung ([place]) 05/17/2014  . COPD, mild ([place]) 08/29/2013  . Cough, persistent 08/29/2013  . 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 H/O cardiac catheterization 07/29/2013  . Breath shortness 06/23/2013  . Chest pain 06/23/2013  . BP (high blood pressure) 06/20/2013  . Diabetes ([place]) 06/20/2013  . HLD (hyperlipidemia) 06/20/2013  . H/O coronary artery bypass  surgery 06/20/2013  . Diabetes mellitus ([place]) 06/20/2013  . H/O total knee replacement 06/03/2013    Past Surgical History  Procedure Laterality Date  . 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 Cardiac catheterization  2001  . Tonsillectomy      as a child  . Colonoscopy    . Cataract extraction      bilateral  . Lumbar laminectomy/decompression microdiscectomy  12/19/2010    Procedure: LUMBAR LAMINECTOMY/DECOMPRESSION MICRODISCECTOMY;  Surgeon: Elaina Hoops;  Location: [facility];  Service: Neurosurgery;  Laterality: N/A;  Lumbar three-four, four-five decompressive lumbar laminectomy  . Joint replacement  right tkr  . Eye surgery      cataracts  . Back surgery      l4 5  . Portacath placement N/A 05/20/2014    Procedure: INSERTION PORT-A-CATH;  Surgeon: Nestor Lewandowsky, MD;  Location: [facility] ORS;  Service: General;  Laterality: N/A;    Current Outpatient Rx  Name  Route  Sig  Dispense  Refill  . albuterol (PROVENTIL HFA;VENTOLIN HFA) 108 (90 BASE) MCG/ACT inhaler   Inhalation   Inhale 2 puffs into the lungs as needed for wheezing or shortness of breath.          . Alcohol Swabs (B-D SINGLE USE SWABS REGULAR) PADS               . ANORO ELLIPTA 62.5-25 MCG/INH AEPB  11     Dispense as written.   Marland Kitchen aspirin 81 MG tablet               . benzonatate (TESSALON) 100 MG capsule      TAKE 1 CAPSULE (100 MG TOTAL) BY MOUTH 3 (THREE) TIMES DAILY AS NEEDED FOR COUGH.   90 capsule   3   . beta carotene w/minerals (OCUVITE) tablet   Oral   Take 1 tablet by mouth 2 (two) times daily.         Marland Kitchen bismuth subsalicylate (KAOPECTATE) 262 MG/15ML suspension   Oral   Take 30 mLs by mouth 3 (three) times daily as needed.   360 mL   0   . Blood Glucose Calibration (TRUETEST CONTROL LEVEL 1) LIQD               . chlorpheniramine-HYDROcodone (TUSSIONEX PENNKINETIC ER) 10-8 MG/5ML SUER   Oral   Take 5 mLs by mouth every 12 (twelve) hours as needed  for cough.   140 mL   0   . diphenoxylate-atropine (LOMOTIL) 2.5-0.025 MG tablet      TAKE 1 TABLET EVERY 4 HOURS AS NEEDED FOR DIARRHEA /LOOSE STOOL   45 tablet   1     This request is for a new prescription for a contr ...   . fluconazole (DIFLUCAN) 100 MG tablet   Oral   Take 1 tablet (100 mg total) by mouth daily.   5 tablet   0   . glimepiride (AMARYL) 1 MG tablet               . glucose blood test strip               . guaiFENesin (MUCINEX) 600 MG 12 hr tablet   Oral   Take 1 tablet (600 mg total) by mouth 2 (two) times daily.   60 tablet   2   . Lancet Devices (TRUEDRAW LANCING DEVICE) MISC               . levofloxacin (LEVAQUIN) 500 MG tablet   Oral   Take 1 tablet (500 mg total) by mouth daily.   7 tablet   0   . lidocaine-prilocaine (EMLA) cream               . magnesium oxide (MAG-OX) 400 MG tablet   Oral   Take 400 mg by mouth daily.           . Melatonin 5 MG CAPS   Oral   Take 1 capsule (5 mg total) by mouth at bedtime as needed.      0   . metFORMIN (GLUCOPHAGE) 500 MG tablet   Oral   Take 500 mg by mouth daily. Take '1000mg'$  po in morning, take '500mg'$  po at bedtime         . metoprolol (LOPRESSOR) 50 MG tablet      daily. 50 mg 2 (two) times daily.         . naproxen (NAPROSYN) 500 MG tablet               . omeprazole (PRILOSEC) 40 MG capsule   Oral   Take 40 mg by mouth daily as needed. For acid reflux           . ondansetron (ZOFRAN) 8 MG tablet   Oral   Take 1 tablet (8 mg total) by mouth 2 (two) times daily. Start the day after chemo  for 3 days. Then take as needed for nausea or vomiting.   30 tablet   1   . oxyCODONE (ROXICODONE) 5 MG immediate release tablet   Oral   Take 1 tablet (5 mg total) by mouth every 6 (six) hours as needed for severe pain.   30 tablet   0   . simvastatin (ZOCOR) 40 MG tablet   Oral   Take 40 mg by mouth at bedtime.           . SYMBICORT 160-4.5 MCG/ACT inhaler             12     Dispense as written.   . traMADol (ULTRAM) 50 MG tablet               . TRUEPLUS LANCETS 28G MISC                 Allergies Review of patient's allergies indicates no known allergies.  Family History  Problem Relation Age of Onset  . Anesthesia problems Neg Hx   . Hypotension Neg Hx   . Malignant hyperthermia Neg Hx   . Pseudochol deficiency Neg Hx     Social History Social History  Substance Use Topics  . Smoking status: Former Smoker -- 40 years    Types: Cigarettes    Quit date: 05/19/1999  . Smokeless tobacco: Never Used     Comment: 2001  . Alcohol Use: No    Review of Systems Constitutional: As above Eyes: No visual changes. ENT: No sore throat. Cardiovascular: Denies chest pain. Respiratory: As above Gastrointestinal: No abdominal pain.  No nausea, no vomiting.  No diarrhea.  No constipation. Genitourinary: Negative for dysuria. Musculoskeletal: Negative for back pain. Skin: Negative for rash. Neurological: Negative for headaches, focal weakness or numbness.  10-point ROS otherwise negative.  ____________________________________________   PHYSICAL EXAM:  VITAL SIGNS: ED Triage Vitals  Enc Vitals Group     BP 04/17/15 1141 74/59 mmHg     Pulse Rate 04/17/15 1141 109     Resp 04/17/15 1141 26     Temp 04/17/15 1214 97.8 F (36.6 C)     Temp Source 04/17/15 1214 Oral     SpO2 04/17/15 1141 88 %     Weight 04/17/15 1141 216 lb (97.977 kg)     Height --      Head Cir --      Peak Flow --      Pain Score --      Pain Loc --      Pain Edu? --      Excl. in [place]? --     Constitutional: Alert and oriented.  Working to breathe. Audible gurgling with each breath. Eyes: Conjunctivae are normal. PERRL. EOMI. Head: Atraumatic. Nose: No congestion/rhinnorhea. Mouth/Throat: Mucous membranes are moist.  Oropharynx non-erythematous. Neck: No stridor.   Cardiovascular: Normal rate, regular rhythm. Grossly normal heart sounds.    Respiratory: Think with increased work of breathing and speaking in 1-3 word phrases. Rales throughout with the left side being greater than the right. Gastrointestinal: Soft and nontender. No distention.  Musculoskeletal: No lower extremity tenderness nor edema.  No joint effusions. Neurologic:  Normal speech and language. No gross focal neurologic deficits are appreciated Skin:  Skin is warm, dry and intact. No rash noted. Psychiatric: Mood and affect are normal. Speech and behavior are normal.  ____________________________________________   LABS (all labs ordered are listed, but only abnormal results are displayed)  Labs Reviewed  CBC WITH DIFFERENTIAL/PLATELET - Abnormal; Notable for the following:    RBC 3.61 (*)    Hemoglobin 10.0 (*)    HCT 30.3 (*)    RDW 17.2 (*)    Neutro Abs 7.5 (*)    Lymphs Abs 0.4 (*)    All other components within normal limits  LACTIC ACID, PLASMA - Abnormal; Notable for the following:    Lactic Acid, Venous 6.4 (*)    All other components within normal limits  CULTURE, BLOOD (ROUTINE X 2)  CULTURE, BLOOD (ROUTINE X 2)  URINE CULTURE  BASIC METABOLIC PANEL  TROPONIN I  URINALYSIS COMPLETEWITH MICROSCOPIC ([facility] ONLY)   ____________________________________________  EKG  ED ECG REPORT I, Doran Stabler, the attending physician, personally viewed and interpreted this ECG.   Date: 04/17/2015  EKG Time: 1150  Rate: 110  Rhythm: sinus tachycardia  Axis: Normal axis  Intervals:Incomplete right bundle branch block  ST&T Change: No ST segment elevation or depression. No abnormal T-wave inversion.  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  EXAM: Portable CHEST 1 VIEW  COMPARISON: 03/10/2015 and earlier, including CT chest 01/05/2015 and earlier and PET-CT 09/10/2014.  FINDINGS: Since the chest x-ray 5 weeks ago, development of confluent airspace opacities through out the left lung, associated with volume loss and mediastinal shift to the left. Suboptimal inspiration accounts for mild right basilar atelectasis. Right lung otherwise clear.  Prior sternotomy for CABG. Cardiac silhouette normal in size, unchanged. Right jugular Port-A-Cath tip projects over the lower SVC, unchanged.  IMPRESSION: 1. Likely postobstructive atelectasis and pneumonia involving the entire left lung, new since a chest x-ray 5 weeks ago. 2. Suboptimal inspiration accounts for mild right basilar atelectasis. Right lung otherwise clear.   Electronically Signed By: Evangeline Dakin M.D. On: 04/17/2015 12:30       ____________________________________________   PROCEDURES  CRITICAL CARE Performed by: Doran Stabler   Total critical care time: 35 minutes  Critical care time was exclusive of separately billable procedures and treating other patients.  Critical care was necessary to treat or prevent imminent or life-threatening deterioration.  Critical care was time spent personally by me on the following activities: development of treatment plan with patient and/or surrogate as well as nursing, discussions with consultants, evaluation of patient's response to treatment, examination of patient, obtaining history from patient or surrogate, ordering and performing treatments and interventions, ordering and review of laboratory studies, ordering and review of radiographic studies, pulse oximetry and re-evaluation of patient's  condition.  ____________________________________________   INITIAL IMPRESSION / ASSESSMENT AND PLAN / ED COURSE  Pertinent labs & imaging results that were available during my care of the patient were reviewed by me and considered in my medical decision making (see chart for details).  ----------------------------------------- 1150 AM on 04/17/2015 -----------------------------------------  I discussed the case with Dr. Grayland Ormond who recommends Decadron every 8 for what is likely a chemotherapy reaction from [place].  Otherwise, he recommends supportive measures. We attempted to contact the respiratory therapist multiple times including through the cordless phone as well as multiple overhead pages. However, we're unable to contact the respiratory  therapist and have the therapist at bedside until about 12:20 PM. At this point he is being started on BiPAP for his increased work of breathing. He was initially desaturating into the low to mid 80s on room air and has been in the high 90s 100% on 6 L of nasal cannula O2. However, his increased work of breathing is persisting. I also discussed the case with Dr. Leonidas Romberg of pulmonology who reviewed the chest x-ray and does not think the patient appears to have a large amount of apparent pneumonic effusion needing a thoracentesis.   ----------------------------------------- 1:09 PM on 04/17/2015 -----------------------------------------  Patient now getting fluids on BiPAP. His breathing is better. He has had better color to his skin. He says that the BiPAP was helping with his work of breathing. Made sepsis protocol and given hospital-acquired antibiotic regimen. Continuing to fluid resuscitate. Signed out to Dr. Lavetta Nielsen. ____________________________________________   FINAL CLINICAL IMPRESSION(S) / ED DIAGNOSES  Left-sided pneumonia. Chemotherapy reaction.    Orbie Pyo, MD 04/17/15 1310

## 2015-04-17 NOTE — Progress Notes (Signed)
Pharmacy Antibiotic Note  Ivan Beasley is a 75 y.o. male admitted on 04/17/2015 with pneumonia/HCAP.  Pharmacy has been consulted for Vanocmycin and Zosyn dosing.  Patient has known history of squamous cell lung carcinoma (stage III) and has recently been treated with levofloxacin for PNA for 7 days.   Patient received one dose of vancomycin 1g IV in ED at 1430 and 1 dose of aztreonam 2gm in the ED.   DW: 99 kg    CrCl: 41m/min     Scr: 3.59   Plan: Ke: 0.023    VD: 70    T1/2: 30   Due to patients acute renal failure will plan to dose vancomycin off of random levels until renal function improves. Will do 6 hour stack dosing with vancomycin 1 gm at 2100 tonight (4/8) and order random level for tomorrow @ 2000.  MRSA PCR has been ordered and results pending. Will recommend discontinuation of vancomycin if results are negative.   Will order Zosyn 3.375g IV EI every 8 hours.  Pharmacy will continue to monitor labs and renal function and adjust medications as needed.    Height: 6' (182.9 cm) Weight: 220 lb 0.3 oz (99.8 kg) IBW/kg (Calculated) : 77.6  Temp (24hrs), Avg:97.4 F (36.3 C), Min:97 F (36.1 C), Max:97.8 F (36.6 C)   Recent Labs Lab 04/14/15 0836 04/17/15 1205 04/17/15 1206  WBC 5.8 8.4  --   CREATININE 1.27* 3.59*  --   LATICACIDVEN  --   --  6.4*    Estimated Creatinine Clearance: 21.8 mL/min (by C-G formula based on Cr of 3.59).    No Known Allergies  Antimicrobials this admission: 04/17/15 Aztreonam >> 04/17/15 04/17/15 Vancomycin  >>  04/17/15 Zosyn >>  Microbiology results: 04/17/15 BCx: pending  04/17/15 UCx: pending 04/17/15 Sputum: pending   04/17/15 MRSA PCR: pending   Thank you for allowing pharmacy to be a part of this patient's care.  SNancy Fetter PharmD Pharmacy Resident  04/17/2015 2:51 PM

## 2015-04-18 ENCOUNTER — Inpatient Hospital Stay
Admit: 2015-04-18 | Discharge: 2015-04-18 | Disposition: A | Discharge status: Home / Self Care | Payer: Medicare HMO | Attending: Internal Medicine | Admitting: Internal Medicine

## 2015-04-18 ENCOUNTER — Inpatient Hospital Stay: Payer: Medicare HMO

## 2015-04-18 DIAGNOSIS — J9600 Acute respiratory failure, unspecified whether with hypoxia or hypercapnia: Secondary | ICD-10-CM | POA: Insufficient documentation

## 2015-04-18 DIAGNOSIS — E119 Type 2 diabetes mellitus without complications: Secondary | ICD-10-CM

## 2015-04-18 DIAGNOSIS — D61818 Other pancytopenia: Secondary | ICD-10-CM

## 2015-04-18 DIAGNOSIS — J9601 Acute respiratory failure with hypoxia: Secondary | ICD-10-CM

## 2015-04-18 DIAGNOSIS — I2510 Atherosclerotic heart disease of native coronary artery without angina pectoris: Secondary | ICD-10-CM

## 2015-04-18 DIAGNOSIS — N179 Acute kidney failure, unspecified: Secondary | ICD-10-CM

## 2015-04-18 DIAGNOSIS — Z9989 Dependence on other enabling machines and devices: Secondary | ICD-10-CM

## 2015-04-18 DIAGNOSIS — Z794 Long term (current) use of insulin: Secondary | ICD-10-CM

## 2015-04-18 DIAGNOSIS — R531 Weakness: Secondary | ICD-10-CM

## 2015-04-18 DIAGNOSIS — Z87891 Personal history of nicotine dependence: Secondary | ICD-10-CM

## 2015-04-18 DIAGNOSIS — J159 Unspecified bacterial pneumonia: Secondary | ICD-10-CM

## 2015-04-18 DIAGNOSIS — R5381 Other malaise: Secondary | ICD-10-CM

## 2015-04-18 DIAGNOSIS — J189 Pneumonia, unspecified organism: Secondary | ICD-10-CM | POA: Insufficient documentation

## 2015-04-18 DIAGNOSIS — R5383 Other fatigue: Secondary | ICD-10-CM

## 2015-04-18 DIAGNOSIS — C3412 Malignant neoplasm of upper lobe, left bronchus or lung: Secondary | ICD-10-CM

## 2015-04-18 LAB — BASIC METABOLIC PANEL
Anion gap: 10 (ref 5–15)
BUN: 60 mg/dL — AB (ref 6–20)
CALCIUM: 7.7 mg/dL — AB (ref 8.9–10.3)
CO2: 14 mmol/L — ABNORMAL LOW (ref 22–32)
Chloride: 106 mmol/L (ref 101–111)
Creatinine, Ser: 3.11 mg/dL — ABNORMAL HIGH (ref 0.61–1.24)
GFR calc Af Amer: 21 mL/min — ABNORMAL LOW (ref >60)
GFR, EST NON AFRICAN AMERICAN: 18 mL/min — AB (ref >60)
GLUCOSE: 254 mg/dL — AB (ref 65–99)
Potassium: 4.8 mmol/L (ref 3.5–5.1)
SODIUM: 130 mmol/L — AB (ref 135–145)

## 2015-04-18 LAB — CBC
HCT: 26.4 % — ABNORMAL LOW (ref 40.0–52.0)
Hemoglobin: 8.7 g/dL — ABNORMAL LOW (ref 13.0–18.0)
MCH: 27.3 pg (ref 26.0–34.0)
MCHC: 32.8 g/dL (ref 32.0–36.0)
MCV: 83.1 fL (ref 80.0–100.0)
PLATELETS: 122 10*3/uL — AB (ref 150–440)
RBC: 3.18 MIL/uL — ABNORMAL LOW (ref 4.40–5.90)
RDW: 17.5 % — AB (ref 11.5–14.5)
WBC: 7.3 10*3/uL (ref 3.8–10.6)

## 2015-04-18 LAB — BLOOD CULTURE ID PANEL (REFLEXED)
Acinetobacter baumannii: NOT DETECTED
CANDIDA ALBICANS: NOT DETECTED
CANDIDA GLABRATA: NOT DETECTED
CANDIDA PARAPSILOSIS: NOT DETECTED
CANDIDA TROPICALIS: NOT DETECTED
CARBAPENEM RESISTANCE: NOT DETECTED
Candida krusei: NOT DETECTED
Enterobacter cloacae complex: NOT DETECTED
Enterobacteriaceae species: NOT DETECTED
Enterococcus species: NOT DETECTED
Escherichia coli: NOT DETECTED
Haemophilus influenzae: DETECTED — AB
KLEBSIELLA OXYTOCA: NOT DETECTED
Klebsiella pneumoniae: NOT DETECTED
LISTERIA MONOCYTOGENES: NOT DETECTED
Methicillin resistance: NOT DETECTED
Neisseria meningitidis: NOT DETECTED
Proteus species: NOT DETECTED
Pseudomonas aeruginosa: NOT DETECTED
SERRATIA MARCESCENS: NOT DETECTED
STREPTOCOCCUS PNEUMONIAE: NOT DETECTED
Staphylococcus aureus (BCID): NOT DETECTED
Staphylococcus species: NOT DETECTED
Streptococcus agalactiae: NOT DETECTED
Streptococcus pyogenes: NOT DETECTED
Streptococcus species: NOT DETECTED
Vancomycin resistance: NOT DETECTED

## 2015-04-18 LAB — URINALYSIS COMPLETE WITH MICROSCOPIC (ARMC ONLY)
Bilirubin Urine: NEGATIVE
Glucose, UA: NEGATIVE mg/dL
KETONES UR: NEGATIVE mg/dL
LEUKOCYTES UA: NEGATIVE
Nitrite: NEGATIVE
PH: 5 (ref 5.0–8.0)
PROTEIN: 30 mg/dL — AB
SPECIFIC GRAVITY, URINE: 1.012 (ref 1.005–1.030)

## 2015-04-18 LAB — GLUCOSE, CAPILLARY
GLUCOSE-CAPILLARY: 277 mg/dL — AB (ref 65–99)
GLUCOSE-CAPILLARY: 214 mg/dL — AB (ref 65–99)
GLUCOSE-CAPILLARY: 251 mg/dL — AB (ref 65–99)
Glucose-Capillary: 247 mg/dL — ABNORMAL HIGH (ref 65–99)

## 2015-04-18 LAB — HEPARIN LEVEL (UNFRACTIONATED): Heparin Unfractionated: 0.2 IU/mL — ABNORMAL LOW (ref 0.30–0.70)

## 2015-04-18 LAB — PROTIME-INR
INR: 1.36
Prothrombin Time: 16.9 seconds — ABNORMAL HIGH (ref 11.4–15.0)

## 2015-04-18 LAB — APTT: aPTT: 39 seconds — ABNORMAL HIGH (ref 24–36)

## 2015-04-18 LAB — CKMB (ARMC ONLY): CK, MB: 5.8 ng/mL — AB (ref 0.5–5.0)

## 2015-04-18 LAB — MAGNESIUM: Magnesium: 1.9 mg/dL (ref 1.7–2.4)

## 2015-04-18 LAB — PROCALCITONIN: PROCALCITONIN: 17.88 ng/mL

## 2015-04-18 LAB — CK: CK TOTAL: 68 U/L (ref 49–397)

## 2015-04-18 LAB — TROPONIN I

## 2015-04-18 LAB — PHOSPHORUS: PHOSPHORUS: 4.2 mg/dL (ref 2.5–4.6)

## 2015-04-18 MED ORDER — SODIUM CHLORIDE 0.9% FLUSH: 10.0000 mL | INTRAVENOUS | Status: DC | PRN

## 2015-04-18 MED ORDER — DIPHENHYDRAMINE HCL 50 MG/ML IJ SOLN
25.0000 mg | Freq: Once | INTRAMUSCULAR | Status: AC
  Administered 2015-04-18: 25 mg via INTRAVENOUS
  Filled 2015-04-18: qty 1

## 2015-04-18 MED ORDER — HEPARIN BOLUS VIA INFUSION
4000.0000 [IU] | Freq: Once | INTRAVENOUS | Status: AC
  Administered 2015-04-18: 4000 [IU] via INTRAVENOUS
  Filled 2015-04-18: qty 4000

## 2015-04-18 MED ORDER — SODIUM CHLORIDE 0.9 % IV SOLN
3.0000 g | Freq: Two times a day (BID) | INTRAVENOUS | Status: DC
  Administered 2015-04-18 – 2015-04-19 (×2): 3 g via INTRAVENOUS
  Filled 2015-04-18 (×4): qty 3

## 2015-04-18 MED ORDER — OCUVITE-LUTEIN PO CAPS
1.0000 | ORAL_CAPSULE | Freq: Every day | ORAL | Status: DC
  Administered 2015-04-18 – 2015-04-23 (×6): 1 via ORAL
  Filled 2015-04-18 (×6): qty 1

## 2015-04-18 MED ORDER — HEPARIN (PORCINE) IN NACL 100-0.45 UNIT/ML-% IJ SOLN
1600.0000 [IU]/h | INTRAMUSCULAR | Status: DC
  Administered 2015-04-18: 1100 [IU]/h via INTRAVENOUS
  Administered 2015-04-19: 3000 [IU]/h via INTRAVENOUS
  Administered 2015-04-19: 1100 [IU]/h via INTRAVENOUS
  Filled 2015-04-18 (×6): qty 250

## 2015-04-18 NOTE — Consult Note (Signed)
[facility] CONSULT NOTE    Date: 04/18/2015                  Patient Name:  Ivan Beasley  MRN: [id number]  DOB: [birth year]  Age / Sex: 75 y.o., male         PCP: Albina Billet, MD                 Service Requesting Consult: Dr. Bridgett Larsson                 Reason for Consult: Acute renal failure/CKD stage III            History of Present Illness: Patient is a 75 y.o. 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 Chest x-ray was obtained which indicated pneumonitis involving the entire left lung. This may have potentially been secondary to the chemotherapy however Haemophilus was detected on blood culture. We are asked to see the patient for acute renal failure. The patient's baseline creatinine appears to be 1.2 with an EGFR 54. When he presented creatinine was up to 3.59. He has been started on IV fluid hydration and creatinine has come down to 3.11. His by mouth intake was rather poor at home per his wife. In addition he was having significant diarrhea at home. Naproxen was also on his medication list.   Medications: Outpatient medications: Prescriptions prior to admission  Medication Sig Dispense Refill Last Dose  . albuterol (PROVENTIL HFA;VENTOLIN HFA) 108 (90 BASE) MCG/ACT inhaler Inhale 2 puffs into the lungs as needed for wheezing or shortness of breath.    Taking  . aspirin 81 MG tablet Take 81 mg by mouth at bedtime.    04/16/2015 at Unknown time  . benzonatate (TESSALON) 100 MG capsule TAKE 1 CAPSULE (100 MG TOTAL) BY MOUTH 3 (THREE) TIMES DAILY AS NEEDED FOR COUGH. 90 capsule 3 04/17/2015 at Unknown time  . beta carotene w/minerals (OCUVITE) tablet Take 1 tablet by mouth 2  (two) times daily.   Taking  . bismuth subsalicylate (KAOPECTATE) 262 MG/15ML suspension Take 30 mLs by mouth 3 (three) times daily as needed. 360 mL 0 Taking  . Blood Glucose Calibration (TRUETEST CONTROL LEVEL 1) LIQD Reported on 04/17/2015   Taking  . chlorpheniramine-HYDROcodone (TUSSIONEX PENNKINETIC ER) 10-8 MG/5ML SUER Take 5 mLs by mouth every 12 (twelve) hours as needed for cough. 140 mL 0 Taking  . diphenoxylate-atropine (LOMOTIL) 2.5-0.025 MG tablet TAKE 1 TABLET EVERY 4 HOURS AS NEEDED FOR DIARRHEA /LOOSE STOOL 45 tablet 1   . fluconazole (DIFLUCAN) 100 MG tablet Take 1 tablet (100 mg total) by mouth daily. 5 tablet 0 04/17/2015 at Unknown time  . glimepiride (AMARYL) 1 MG tablet    04/17/2015 at Unknown time  . glucose blood test strip    Taking  . guaiFENesin (MUCINEX) 600 MG 12 hr tablet Take 1 tablet (600 mg total) by mouth 2 (two) times daily. 60 tablet 2 Taking  . Lancet Devices (TRUEDRAW LANCING DEVICE) MISC    Taking  . lidocaine-prilocaine (EMLA) cream    Taking  . magnesium oxide (MAG-OX) 400 MG tablet Take 400 mg by mouth daily.     04/16/2015 at Unknown time  . Melatonin 5 MG  CAPS Take 1 capsule (5 mg total) by mouth at bedtime as needed.  0 04/16/2015 at Unknown time  . metFORMIN (GLUCOPHAGE) 500 MG tablet Take 500 mg by mouth daily. Take 1038m po in morning, take 5048mpo at bedtime   04/17/2015 at Unknown time  . metoprolol tartrate (LOPRESSOR) 25 MG tablet Take 25 mg by mouth 2 (two) times daily.   04/17/2015 at Unknown time  . naproxen (NAPROSYN) 500 MG tablet 2 (two) times daily with a meal.    Taking  . omeprazole (PRILOSEC) 40 MG capsule Take 40 mg by mouth daily. For acid reflux    04/17/2015 at Unknown time  . ondansetron (ZOFRAN) 8 MG tablet Take 1 tablet (8 mg total) by mouth 2 (two) times daily. Start the day after chemo for 3 days. Then take as needed for nausea or vomiting. 30 tablet 1 Taking  . oxyCODONE (ROXICODONE) 5 MG immediate release tablet Take 1 tablet (5 mg total)  by mouth every 6 (six) hours as needed for severe pain. 30 tablet 0 Taking  . simvastatin (ZOCOR) 40 MG tablet Take 40 mg by mouth at bedtime.     04/16/2015 at Unknown time  . SYMBICORT 160-4.5 MCG/ACT inhaler 2 (two) times daily.   12 Taking  . traMADol (ULTRAM) 50 MG tablet 50 mg every 6 (six) hours as needed.        Current medications: Current Facility-Administered Medications  Medication Dose Route Frequency Provider Last Rate Last Dose  . 0.9 %  sodium chloride infusion   Intravenous Continuous DaLytle ButteMD 100 mL/hr at 04/17/15 1755    . acetaminophen (TYLENOL) tablet 650 mg  650 mg Oral Q6H PRN DaLytle ButteMD       Or  . acetaminophen (TYLENOL) suppository 650 mg  650 mg Rectal Q6H PRN DaLytle ButteMD      . amiodarone (NEXTERONE PREMIX) 360 MG/200ML (1.8 mg/mL) IV infusion  30 mg/hr Intravenous Continuous StAnders SimmondsMD 16.7 mL/hr at 04/18/15 0608 30 mg/hr at 04/18/15 0608  . Ampicillin-Sulbactam (UNASYN) 3 g in sodium chloride 0.9 % 100 mL IVPB  3 g Intravenous Q12H Sheema M Hallaji, RPH      . antiseptic oral rinse (CPC / CETYLPYRIDINIUM CHLORIDE 0.05%) solution 7 mL  7 mL Mouth Rinse q12n4p Vishal Mungal, MD   7 mL at 04/17/15 1651  . aspirin EC tablet 81 mg  81 mg Oral Daily DaLytle ButteMD   81 mg at 04/18/15 [id number]. benzonatate (TESSALON) capsule 200 mg  200 mg Oral TID DaLytle ButteMD   200 mg at 04/18/15 [id number]. chlorhexidine (PERIDEX) 0.12 % solution 15 mL  15 mL Mouth Rinse BID Vishal Mungal, MD   15 mL at 04/18/15 0937  . chlorpheniramine-HYDROcodone (TUSSIONEX) 10-8 MG/5ML suspension 5 mL  5 mL Oral Q12H PRN DaLytle ButteMD      . guaiFENesin (MLah[facility]12 hr tablet 600 mg  600 mg Oral BID DaLytle ButteMD   600 mg at 04/18/15 097711. guaiFENesin-codeine 100-10 MG/5ML solution 10 mL  10 mL Oral Q4H PRN DaLytle ButteMD   10 mL at 04/17/15 1503  . heparin ADULT infusion 100 units/mL (25000 units/250 mL)  1,100 Units/hr Intravenous Continuous Sheema M  Hallaji, RPH 11 mL/hr at 04/18/15 1000 1,100 Units/hr at 04/18/15 1000  . insulin aspart (novoLOG) injection 0-20 Units  0-20 Units Subcutaneous TID WC DaLytle Butte  MD   11 Units at 04/18/15 0748  . insulin aspart (novoLOG) injection 0-5 Units  0-5 Units Subcutaneous QHS [name], MD   0 Units at 04/17/15 2200  . ipratropium-albuterol (DUONEB) 0.5-2.5 (3) MG/3ML nebulizer solution 3 mL  3 mL Nebulization Q6H [name], MD   3 mL at 04/18/15 0828  . magnesium oxide (MAG-OX) tablet 400 mg  400 mg Oral Daily [name], MD   400 mg at 04/18/15 0277  . mometasone-formoterol (DULERA) 200-5 MCG/ACT inhaler 2 puff  2 puff Inhalation BID [name], MD   2 puff at 04/17/15 2000  . morphine 2 MG/ML injection 2 mg  2 mg Intravenous Q4H PRN [name], MD   2 mg at 04/17/15 1826  . multivitamin-lutein (OCUVITE-LUTEIN) capsule 1 capsule  1 capsule Oral Daily [name], MD   1 capsule at 04/18/15 [phone]  . nystatin (MYCOSTATIN) 100000 UNIT/ML suspension 500,000 Units  5 mL Oral QID [name], MD   500,000 Units at 04/18/15 [phone]  . ondansetron (ZOFRAN) tablet 4 mg  4 mg Oral Q6H PRN [name], MD       Or  . ondansetron [facility]) injection 4 mg  4 mg Intravenous Q6H PRN [name], MD      . oxyCODONE (Oxy IR/ROXICODONE) immediate release tablet 5 mg  5 mg Oral Q4H PRN [name], MD      . pantoprazole (PROTONIX) EC tablet 40 mg  40 mg Oral Daily [name], MD   40 mg at 04/18/15 6720  . simvastatin (ZOCOR) tablet 40 mg  40 mg Oral QHS [name], MD   40 mg at 04/17/15 2144  . sodium chloride flush (NS) 0.9 % injection 3 mL  3 mL Intravenous Q12H [name], MD   3 mL at 04/17/15 2200  . traMADol (ULTRAM) tablet 50 mg  50 mg Oral Q6H PRN [name], MD       Facility-Administered Medications Ordered in Other Encounters  Medication Dose Route Frequency Provider Last Rate Last Dose  . 0.9 %  sodium chloride infusion   Intravenous Continuous Forest Gleason, MD    Stopped at 12/11/14 1034  . lidocaine-prilocaine (EMLA) cream   Topical Once Evlyn Kanner, NP      . sodium chloride 0.9 % injection 10 mL  10 mL Intracatheter PRN Forest Gleason, MD   10 mL at 06/03/14 1106  . sodium chloride 0.9 % injection 10 mL  10 mL Intravenous PRN Forest Gleason, MD   10 mL at 07/30/14 0920  . sodium chloride 0.9 % injection 10 mL  10 mL Intracatheter PRN Forest Gleason, MD   10 mL at 12/11/14 9470      Allergies: No Known Allergies    Past Medical History: Past Medical History  Diagnosis Date  . Coronary artery disease   . Pneumonia 2000  . Arthritis   . 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 Bruises easily   . GERD (gastroesophageal reflux disease)     takes Omeprazole daily as needed for stomach pain  . Blood transfusion 2001  . Diabetes mellitus     takes Metformin daily;  . Impaired hearing     bil hearing aide  . Macular degeneration     being watched for this but hasn't been "completely" diagnosed  . Myocardial infarction ([place]) 2001  . Cancer ([place])  Past Surgical History: Past Surgical History  Procedure Laterality Date  . 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 Cardiac catheterization  2001  . Tonsillectomy      as a child  . Colonoscopy    . Cataract extraction      bilateral  . Lumbar laminectomy/decompression microdiscectomy  12/19/2010    Procedure: LUMBAR LAMINECTOMY/DECOMPRESSION MICRODISCECTOMY;  Surgeon: Elaina Hoops;  Location: [place] [facility];  Service: Neurosurgery;  Laterality: N/A;  Lumbar three-four, four-five decompressive lumbar laminectomy  . Joint replacement  right tkr  . Eye surgery      cataracts  . Back surgery      l4 5  . Portacath placement N/A 05/20/2014    Procedure: INSERTION PORT-A-CATH;  Surgeon: Nestor Lewandowsky, MD;  Location: [facility] ORS;  Service: General;  Laterality: N/A;     Family History: Family History  Problem Relation Age of Onset  . Anesthesia problems Neg Hx   .  Hypotension Neg Hx   . Malignant hyperthermia Neg Hx   . Pseudochol deficiency Neg Hx   . Diabetes type II Other      Social History: Social History   Social History  . Marital Status: Married    Spouse Name: N/A  . Number of Children: N/A  . Years of Education: N/A   Occupational History  . Not on file.   Social History Main Topics  . Smoking status: Former Smoker -- 40 years    Types: Cigarettes    Quit date: 05/19/1999  . Smokeless tobacco: Never Used     Comment: 2001  . Alcohol Use: No  . Drug Use: No  . Sexual Activity: No   Other Topics Concern  . Not on file   Social History Narrative     Review of Systems: Review of Systems  Constitutional: Negative for fever, chills and weight loss.  HENT: Negative for hearing loss and tinnitus.   Eyes: Negative for blurred vision, double vision and photophobia.  Respiratory: Positive for cough, sputum production and shortness of breath. Negative for hemoptysis.   Cardiovascular: Negative for chest pain, palpitations and orthopnea.  Gastrointestinal: Positive for diarrhea. Negative for heartburn, nausea and vomiting.  Genitourinary: Negative for dysuria, urgency and frequency.  Musculoskeletal: Positive for back pain. Negative for myalgias.  Skin: Negative for itching and rash.  Neurological: Negative for dizziness, focal weakness and headaches.  Endo/Heme/Allergies: Negative for polydipsia. Does not bruise/bleed easily.  Psychiatric/Behavioral: Negative for depression. The patient is not nervous/anxious.      Vital Signs: Blood pressure 97/60, pulse 121, temperature 97.9 F (36.6 C), temperature source Axillary, resp. rate 19, height 6' (1.829 m), weight 99.8 kg (220 lb 0.3 oz), SpO2 95 %.  Weight trends: Filed Weights   04/17/15 1141 04/17/15 1419  Weight: 97.977 kg (216 lb) 99.8 kg (220 lb 0.3 oz)    Physical Exam: General: NAD, resting in bed  Head: Normocephalic, atraumatic.  Eyes: Anicteric, EOMI   Nose: Mucous membranes moist, not inflammed, nonerythematous.  Throat: Oropharynx nonerythematous, no exudate appreciated.   Neck: Supple, trachea midline.  Lungs:  Rales on the left, slightly increased work of breathing  Heart: S1S2 irregular tachycardic  Abdomen:  BS normoactive. Soft, Nondistended, non-tender.  No masses or organomegaly.  Extremities: No pretibial edema.  Neurologic: A&O X3, Motor strength is 5/5 in the all 4 extremities  Skin: No visible rashes, scars.    Lab results: Basic Metabolic Panel:  Recent Labs Lab 04/14/15  0459 04/17/15 1205 04/17/15 1929 04/18/15 0504  NA  --  130* 131* 130*  K  --  4.7 4.9 4.8  CL  --  102 105 106  CO2  --  15* 13* 14*  GLUCOSE  --  112* 141* 254*  BUN  --  55* 55* 60*  CREATININE  --  3.59* 3.46* 3.11*  CALCIUM  --  8.3* 7.9* 7.7*  MG 1.9  --  1.7 1.9  PHOS  --   --   --  4.2    Liver Function Tests:  Recent Labs Lab 04/14/15 0836  AST 29  ALT 21  ALKPHOS 31*  BILITOT 0.5  PROT 7.2  ALBUMIN 3.5   No results for input(s): LIPASE, AMYLASE in the last 168 hours. No results for input(s): AMMONIA in the last 168 hours.  CBC:  Recent Labs Lab 04/14/15 0836 04/17/15 1205 04/18/15 0504  WBC 5.8 8.4 7.3  NEUTROABS 3.9 7.5*  --   HGB 10.8* 10.0* 8.7*  HCT 32.8* 30.3* 26.4*  MCV 84.1 83.9 83.1  PLT 162 154 122*    Cardiac Enzymes:  Recent Labs Lab 04/17/15 1205 04/17/15 1929 04/18/15 0112 04/18/15 0504  TROPONINI <0.03 0.06* <0.03 <0.03    BNP: Invalid input(s): POCBNP  CBG:  Recent Labs Lab 04/17/15 1621 04/17/15 2133 04/18/15 0702  GLUCAP 137* 195* 277*    Microbiology: Results for orders placed or performed during the hospital encounter of 04/17/15  Blood Culture (routine x 2)     Status: None (Preliminary result)   Collection Time: 04/17/15 12:05 PM  Result Value Ref Range Status   Specimen Description BLOOD RIGHT CHEST  Final   Special Requests BOTTLES DRAWN AEROBIC AND ANAEROBIC   3CC  Final   Culture  Setup Time   Final    GRAM NEGATIVE RODS ANAEROBIC BOTTLE ONLY CRITICAL RESULT CALLED TO, READ BACK BY AND VERIFIED WITH: Sheema Hallaji @ [phone] 04/18/15 by [facility]    Culture NO GROWTH <12 HOURS  Final   Report Status PENDING  Incomplete  Blood Culture ID Panel (Reflexed)     Status: Abnormal   Collection Time: 04/17/15 12:05 PM  Result Value Ref Range Status   Enterococcus species NOT DETECTED NOT DETECTED Final   Vancomycin resistance NOT DETECTED NOT DETECTED Final   Listeria monocytogenes NOT DETECTED NOT DETECTED Final   Staphylococcus species NOT DETECTED NOT DETECTED Final   Staphylococcus aureus NOT DETECTED NOT DETECTED Final   Methicillin resistance NOT DETECTED NOT DETECTED Final   Streptococcus species NOT DETECTED NOT DETECTED Final   Streptococcus agalactiae NOT DETECTED NOT DETECTED Final   Streptococcus pneumoniae NOT DETECTED NOT DETECTED Final   Streptococcus pyogenes NOT DETECTED NOT DETECTED Final   Acinetobacter baumannii NOT DETECTED NOT DETECTED Final   Enterobacteriaceae species NOT DETECTED NOT DETECTED Final   Enterobacter cloacae complex NOT DETECTED NOT DETECTED Final   Escherichia coli NOT DETECTED NOT DETECTED Final   Klebsiella oxytoca NOT DETECTED NOT DETECTED Final   Klebsiella pneumoniae NOT DETECTED NOT DETECTED Final   Proteus species NOT DETECTED NOT DETECTED Final   Serratia marcescens NOT DETECTED NOT DETECTED Final   Carbapenem resistance NOT DETECTED NOT DETECTED Final   Haemophilus influenzae DETECTED (A) NOT DETECTED Final    Comment: CRITICAL RESULT CALLED TO, READ BACK BY AND VERIFIED WITH: Sheema Hallaji @ 1423 04/18/15 by [place]    Neisseria meningitidis NOT DETECTED NOT DETECTED Final   Pseudomonas aeruginosa NOT DETECTED NOT DETECTED Final  Candida albicans NOT DETECTED NOT DETECTED Final   Candida glabrata NOT DETECTED NOT DETECTED Final   Candida krusei NOT DETECTED NOT DETECTED Final   Candida parapsilosis NOT  DETECTED NOT DETECTED Final   Candida tropicalis NOT DETECTED NOT DETECTED Final  MRSA PCR Screening     Status: None   Collection Time: 04/17/15  2:19 PM  Result Value Ref Range Status   MRSA by PCR NEGATIVE NEGATIVE Final    Comment:        The GeneXpert MRSA Assay (FDA approved for NASAL specimens only), is one component of a comprehensive MRSA colonization surveillance program. It is not intended to diagnose MRSA infection nor to guide or monitor treatment for MRSA infections.     Coagulation Studies: No results for input(s): LABPROT, INR in the last 72 hours.  Urinalysis:  Recent Labs  04/17/15 1929  COLORURINE YELLOW*  LABSPEC 1.012  PHURINE 5.0  GLUCOSEU NEGATIVE  HGBUR 2+*  BILIRUBINUR NEGATIVE  KETONESUR NEGATIVE  PROTEINUR 30*  NITRITE NEGATIVE  LEUKOCYTESUR NEGATIVE      Imaging: Dg Chest 1 View  04/17/2015  CLINICAL DATA:  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 EXAM: Portable CHEST 1 VIEW COMPARISON:  03/10/2015 and earlier, including CT chest 01/05/2015 and earlier and PET-CT 09/10/2014. FINDINGS: Since the chest x-ray 5 weeks ago, development of confluent airspace opacities through out the left lung, associated with volume loss and mediastinal shift to the left. Suboptimal inspiration accounts for mild right basilar atelectasis. Right lung otherwise clear. Prior sternotomy for CABG. Cardiac silhouette normal in size, unchanged. Right jugular Port-A-Cath tip projects over the lower SVC, unchanged. IMPRESSION: 1. Likely postobstructive atelectasis and pneumonia involving the entire left lung, new since a chest x-ray 5 weeks ago. 2. Suboptimal inspiration accounts for mild right basilar atelectasis. Right lung otherwise clear. Electronically Signed   By:  Evangeline Dakin M.D.   On: 04/17/2015 12:30   Dg Chest Port 1 View  04/18/2015  CLINICAL DATA:  Followup postobstructive atelectasis and pneumonia involving the left lung. Current history of left upper lobe lung cancer. EXAM: PORTABLE CHEST 1 VIEW COMPARISON:  04/17/2015 and earlier, including CT chest 01/05/2015. FINDINGS: Sternotomy for CABG. Cardiac silhouette normal in size. Consolidation throughout the left lung with mediastinal shift to the left, unchanged since yesterday. Right lung emphysematous though clear. Right jugular Port-A-Cath tip projects over the lower SVC. IMPRESSION: 1. Stable likely postobstructive atelectasis and pneumonia throughout the entire left lung. 2. Right lung remains clear.  No new abnormalities. Electronically Signed   By: Evangeline Dakin M.D.   On: 04/18/2015 07:59      Assessment & Plan: Pt is a 75 y.o. 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 Initial workup showed left-sided pneumonitis.  1. Acute renal failure/chronic kidney disease stage III Baseline creatinine 1.2 with EGFR 54. Acute renal failure most likely related to diarrhea, poor by mouth intake, and possibly Naprosyn. Continue IV fluid hydration as renal function appears to be improving. Awaiting renal ultrasound and also check SPEP and UPEP. No indication for  dialysis at the moment.  2. Acute respiratory failure secondary to bacterial pneumonia. Haemophilus influenza growing on blood cultures. Anabolic therapy per hospitalist and critical care. Continue high flow nasal cannula.  3. Hyponatremia. 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 Patient requires volume at the moment given  diarrhea earlier. Continue to monitor serum sodium.  4. Thanks for consultation.

## 2015-04-18 NOTE — Consult Note (Signed)
[place]  [phone]) [phone] [phone]) [phone]  ID: Ivan Beasley OB: 1941-01-06  MR#: [id number]  AOZ#:[id number]  Patient Care Team: Albina Billet, MD as PCP - General (Internal Medicine)  CHIEF COMPLAINT:  Chief Complaint  Patient presents with  . Shortness of Breath    INTERVAL HISTORY: Patient is a 75 year old male who is receiving immunotherapy with nivolumab, most recently on April 14, 2015, who presented to the emergency room with increasing shortness of breath and fevers. His wife also reported some altered mental status. Patient was found to have oxygen saturations approximately 80% and was placed on BiPAP with significant improvement of his mentation and oxygenation. Blood cultures positive and anaerobic bottles for Haemophilus influenza. Patient feels improved since admission, but still is requiring BiPAP to maintain his saturations. He denies any further fevers. His mental status has improved. He denies any chest pain or hemoptysis. He denies any nausea, vomiting, constipation. He had diarrhea, but this has since resolved. Patient feels generally terrible, but offers no further specific complaints.  REVIEW OF SYSTEMS:   Review of Systems  Constitutional: Positive for fever and malaise/fatigue. Negative for weight loss.  Respiratory: Positive for cough and shortness of breath. Negative for hemoptysis.   Cardiovascular: Negative.  Negative for chest pain.  Gastrointestinal: Positive for diarrhea. Negative for blood in stool and melena.  Genitourinary: Negative.   Musculoskeletal: Negative.   Neurological: Positive for weakness.    As per HPI. Otherwise, a complete review of systems is negatve.  PAST MEDICAL HISTORY: Past Medical History  Diagnosis Date  . Coronary artery disease   . Pneumonia 2000  . Arthritis   . 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 Bruises easily   . GERD (gastroesophageal reflux disease)     takes  Omeprazole daily as needed for stomach pain  . Blood transfusion 2001  . Diabetes mellitus     takes Metformin daily;  . Impaired hearing     bil hearing aide  . Macular degeneration     being watched for this but hasn't been "completely" diagnosed  . Myocardial infarction ([place]) 2001  . Cancer [facility])     PAST SURGICAL HISTORY: Past Surgical History  Procedure Laterality Date  . 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 Cardiac catheterization  2001  . Tonsillectomy      as a child  . Colonoscopy    . Cataract extraction      bilateral  . Lumbar laminectomy/decompression microdiscectomy  12/19/2010    Procedure: LUMBAR LAMINECTOMY/DECOMPRESSION MICRODISCECTOMY;  Surgeon: Elaina Hoops;  Location: [facility];  Service: Neurosurgery;  Laterality: N/A;  Lumbar three-four, four-five decompressive lumbar laminectomy  . Joint replacement  right tkr  . Eye surgery      cataracts  . Back surgery      l4 5  . Portacath placement N/A 05/20/2014    Procedure: INSERTION PORT-A-CATH;  Surgeon: Nestor Lewandowsky, MD;  Location: [facility] OR[facility];  Service: General;  Laterality: N/A;    FAMILY HISTORY Family History  Problem Relation Age of Onset  . Anesthesia problems Neg Hx   . Hypotension Neg Hx   . Malignant hyperthermia Neg Hx   . Pseudochol deficiency Neg Hx   . Diabetes type II Other        ADVANCED DIRECTIVES:    HEALTH MAINTENANCE: Social History  Substance Use Topics  . Smoking status: Former Smoker -- 40 years  Types: Cigarettes    Quit date: 05/19/1999  . Smokeless tobacco: Never Used     Comment: 2001  . Alcohol Use: No     Colonoscopy:  PAP:  Bone density:  Lipid panel:  No Known Allergies  Current Facility-Administered Medications  Medication Dose Route Frequency Provider Last Rate Last Dose  . 0.9 %  sodium chloride infusion   Intravenous Continuous [name], MD 100 mL/hr at 04/17/15 1755    . acetaminophen (TYLENOL) tablet 650 mg  650 mg Oral Q6H  PRN [name], MD       Or  . acetaminophen (TYLENOL) suppository 650 mg  650 mg Rectal Q6H PRN [name], MD      . amiodarone (NEXTERONE PREMIX) 360 MG/200ML (1.8 mg/mL) IV infusion  30 mg/hr Intravenous Continuous Anders Simmonds, MD 16.7 mL/hr at 04/18/15 0608 30 mg/hr at 04/18/15 0608  . Ampicillin-Sulbactam (UNASYN) 3 g in sodium chloride 0.9 % 100 mL IVPB  3 g Intravenous Q12H Sheema M Hallaji, RPH      . antiseptic oral rinse (CPC / CETYLPYRIDINIUM CHLORIDE 0.05%) solution 7 mL  7 mL Mouth Rinse q12n4p Vishal Mungal, MD   7 mL at 04/17/15 1651  . aspirin EC tablet 81 mg  81 mg Oral Daily [name], MD   81 mg at 04/17/15 2148  . benzonatate (TESSALON) capsule 200 mg  200 mg Oral TID [name], MD   200 mg at 04/17/15 2144  . chlorhexidine (PERIDEX) 0.12 % solution 15 mL  15 mL Mouth Rinse BID Vishal Mungal, MD   15 mL at 04/17/15 2145  . chlorpheniramine-HYDROcodone (TUSSIONEX) 10-8 MG/5ML suspension 5 mL  5 mL Oral Q12H PRN [name], MD      . guaiFENesin [facility]) 12 hr tablet 600 mg  600 mg Oral BID [name], MD   600 mg at 04/17/15 2144  . guaiFENesin-codeine 100-10 MG/5ML solution 10 mL  10 mL Oral Q4H PRN [name], MD   10 mL at 04/17/15 1503  . heparin injection 5,000 Units  5,000 Units Subcutaneous 3 times per day [name], MD   5,000 Units at 04/18/15 0543  . insulin aspart (novoLOG) injection 0-20 Units  0-20 Units Subcutaneous TID WC [name], MD   11 Units at 04/18/15 [phone]  . insulin aspart (novoLOG) injection 0-5 Units  0-5 Units Subcutaneous QHS [name], MD   0 Units at 04/17/15 2200  . ipratropium-albuterol (DUONEB) 0.5-2.5 (3) MG/3ML nebulizer solution 3 mL  3 mL Nebulization Q6H [name], MD   3 mL at 04/18/15 0251  . magnesium oxide (MAG-OX) tablet 400 mg  400 mg Oral Daily [name], MD      . mometasone-formoterol [facility]) 200-5 MCG/ACT inhaler 2 puff  2 puff Inhalation BID [name], MD   2 puff at 04/17/15 2000   . morphine 2 MG/ML injection 2 mg  2 mg Intravenous Q4H PRN [name], MD   2 mg at 04/17/15 1826  . multivitamin-lutein (OCUVITE-LUTEIN) capsule 1 capsule  1 capsule Oral Daily [name], MD      . nystatin (MYCOSTATIN) 100000 UNIT/ML suspension 500,000 Units  5 mL Oral QID [name], MD   500,000 Units at 04/17/15 2145  . ondansetron (ZOFRAN) tablet 4 mg  4 mg Oral Q6H PRN [name], MD       Or  . ondansetron (  ZOFRAN) injection 4 mg  4 mg Intravenous Q6H PRN [name], MD      . oxyCODONE (Oxy IR/ROXICODONE) immediate release tablet 5 mg  5 mg Oral Q4H PRN [name], MD      . pantoprazole (PROTONIX) EC tablet 40 mg  40 mg Oral Daily [name], MD   40 mg at 04/17/15 1649  . simvastatin (ZOCOR) tablet 40 mg  40 mg Oral QHS [name], MD   40 mg at 04/17/15 2144  . sodium chloride flush (NS) 0.9 % injection 3 mL  3 mL Intravenous Q12H [name], MD   3 mL at 04/17/15 2200  . traMADol (ULTRAM) tablet 50 mg  50 mg Oral Q6H PRN [name], MD       Facility-Administered Medications Ordered in Other Encounters  Medication Dose Route Frequency Provider Last Rate Last Dose  . 0.9 %  sodium chloride infusion   Intravenous Continuous Forest Gleason, MD   Stopped at 12/11/14 1034  . lidocaine-prilocaine (EMLA) cream   Topical Once Evlyn Kanner, NP      . sodium chloride 0.9 % injection 10 mL  10 mL Intracatheter PRN Forest Gleason, MD   10 mL at 06/03/14 1106  . sodium chloride 0.9 % injection 10 mL  10 mL Intravenous PRN Forest Gleason, MD   10 mL at 07/30/14 0920  . sodium chloride 0.9 % injection 10 mL  10 mL Intracatheter PRN Forest Gleason, MD   10 mL at 12/11/14 0912    OBJECTIVE: Filed Vitals:   04/18/15 0700 04/18/15 0800  BP: 101/65 101/64  Pulse: 114 118  Temp:    Resp: 27 21     Body mass index is 29.83 kg/(m^2).    ECOG FS:2 - Symptomatic, <50% confined to bed  General: Well-developed, well-nourished, no acute distress. Eyes: Pink conjunctiva,  anicteric sclera. HEENT: Normocephalic, moist mucous membranes, clear oropharnyx. Lungs: Diminished breath sounds.  Heart: Regular rate and rhythm. No rubs, murmurs, or gallops. Abdomen: Soft, nontender, nondistended. No organomegaly noted, normoactive bowel sounds. Musculoskeletal: No edema, cyanosis, or clubbing. Neuro: Alert, answering all questions appropriately. Cranial nerves grossly intact. Skin: No rashes or petechiae noted. Psych: Normal affect.   LAB RESULTS:  Lab Results  Component Value Date   NA 130* 04/18/2015   K 4.8 04/18/2015   CL 106 04/18/2015   CO2 14* 04/18/2015   GLUCOSE 254* 04/18/2015   BUN 60* 04/18/2015   CREATININE 3.11* 04/18/2015   CALCIUM 7.7* 04/18/2015   PROT 7.2 04/14/2015   ALBUMIN 3.5 04/14/2015   AST 29 04/14/2015   ALT 21 04/14/2015   ALKPHOS 31* 04/14/2015   BILITOT 0.5 04/14/2015   GFRNONAA 18* 04/18/2015   GFRAA 21* 04/18/2015    Lab Results  Component Value Date   WBC 7.3 04/18/2015   NEUTROABS 7.5* 04/17/2015   HGB 8.7* 04/18/2015   HCT 26.4* 04/18/2015   MCV 83.1 04/18/2015   PLT 122* 04/18/2015     STUDIES: Dg Chest 1 View  04/17/2015  CLINICAL DATA:  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 EXAM: Portable CHEST 1 VIEW COMPARISON:  03/10/2015 and earlier, including CT chest 01/05/2015 and earlier and PET-CT 09/10/2014. FINDINGS: Since the chest x-ray 5 weeks ago, development of confluent airspace opacities  through out the left lung, associated with volume loss and mediastinal shift to the left. Suboptimal inspiration accounts for mild right basilar atelectasis. Right lung otherwise clear. Prior sternotomy for CABG. Cardiac silhouette normal in size, unchanged. Right jugular Port-A-Cath tip projects over the lower SVC,  unchanged. IMPRESSION: 1. Likely postobstructive atelectasis and pneumonia involving the entire left lung, new since a chest x-ray 5 weeks ago. 2. Suboptimal inspiration accounts for mild right basilar atelectasis. Right lung otherwise clear. Electronically Signed   By: Evangeline Dakin M.D.   On: 04/17/2015 12:30   Dg Chest Port 1 View  04/18/2015  CLINICAL DATA:  Followup postobstructive atelectasis and pneumonia involving the left lung. Current history of left upper lobe lung cancer. EXAM: PORTABLE CHEST 1 VIEW COMPARISON:  04/17/2015 and earlier, including CT chest 01/05/2015. FINDINGS: Sternotomy for CABG. Cardiac silhouette normal in size. Consolidation throughout the left lung with mediastinal shift to the left, unchanged since yesterday. Right lung emphysematous though clear. Right jugular Port-A-Cath tip projects over the lower SVC. IMPRESSION: 1. Stable likely postobstructive atelectasis and pneumonia throughout the entire left lung. 2. Right lung remains clear.  No new abnormalities. Electronically Signed   By: Evangeline Dakin M.D.   On: 04/18/2015 07:59    ASSESSMENT: Stage IV squamous cell carcinoma of the lung now with pneumonitis and bacteremia.  PLAN:    1. Lung cancer: Patient received his most recent infusion of immunotherapy on April 14, 2015. His pneumonitis and shortness of breath can be attributed to his nivolumab. Continue supportive care with BiPAP and oxygenation as well as IV steroids. No further treatments are planned at this time. Patient has been instructed to keep his previously scheduled follow-up for PET scan and M.D. evaluation in approximately 2 weeks. 2. Pneumonitis: IV steroids as above. 3. Bacteremia: Blood cultures 1 of 2 positive for Haemophilus influenza. Case discussed with pulmonology. Agree with current antibiotics. 4. Anemia: Multifactorial, stable. Patient does not require blood transfusion at this time. 5. Pancytopenia: Mild, monitor. 6. Acute renal  failure: Likely multifactorial. Nephrology consult pending.  Appreciate consult, will follow.  Patient expressed understanding and was in agreement with this plan. He also understands that He can call clinic at any time with any questions, concerns, or complaints.   Cancer of lung [facility])   Staging form: Lung, AJCC 7th Edition     Clinical: T2, N2, M0 - Unsigned   Lloyd Huger, MD   04/18/2015 8:23 AM

## 2015-04-18 NOTE — Progress Notes (Signed)
Pharmacy Antibiotic Note  Ivan Beasley is a 75 y.o. male admitted on 04/17/2015 with pneumonia/HCAP.   Pharmacy was contacted @ 0755 on 4/9 by lab. BCID results showing H. Influenzae in anaerobic bottle.  Pharmacy has been consulted for Unasyn dosing.  Scr: 3.11    CrCl: 25   Plan: Due to patient's CrCl <61m/min, will start patient on Unasyn 3gm IV every 12 hours.  Pharmacy will continue to monitor renal function and adjust dose as as needed.   Height: 6' (182.9 cm) Weight: 220 lb 0.3 oz (99.8 kg) IBW/kg (Calculated) : 77.6  Temp (24hrs), Avg:97.7 F (36.5 C), Min:97 F (36.1 C), Max:98 F (36.7 C)   Recent Labs Lab 04/14/15 0836 04/17/15 1205 04/17/15 1206 04/17/15 1449 04/17/15 1929 04/18/15 0504  WBC 5.8 8.4  --   --   --  7.3  CREATININE 1.27* 3.59*  --   --  3.46* 3.11*  LATICACIDVEN  --   --  6.4* 6.4*  --   --     Estimated Creatinine Clearance: 25.1 mL/min (by C-G formula based on Cr of 3.11).    No Known Allergies  Antimicrobials this admission: 04/17/15 Vancomycin  >> 04/18/15 04/17/15 Zosyn >> 04/18/15 04/18/15 Unasyn >>    Microbiology results: 4/9 BCx: BCID showing H. Influenza 4/8 UCx: pending 4/8 Sputum: pending 4/8 MRSA PCR: negative   Thank you for allowing pharmacy to be a part of this patient's care.  SNancy Fetter PharmD Pharmacy Resident  04/18/2015 8:11 AM

## 2015-04-18 NOTE — Progress Notes (Addendum)
[place] [facility] [place] NAME: Ivan Beasley    MR#:  [id number]  DATE OF BIRTH:  08/02/1940  SUBJECTIVE:  CHIEF COMPLAINT:   Chief Complaint  Patient presents with  . Shortness of Breath   On BIPAP. Has SOB and cough. REVIEW OF SYSTEMS:  CONSTITUTIONAL: No fever, has weakness.  EYES: No blurred or double vision.  EARS, NOSE, AND THROAT: No tinnitus or ear pain.  RESPIRATORY: has cough, shortness of breath, no wheezing or hemoptysis.  CARDIOVASCULAR: No chest pain, orthopnea, edema.  GASTROINTESTINAL: No nausea, vomiting, or abdominal pain. But has diarrhea. GENITOURINARY: No dysuria, hematuria.  ENDOCRINE: No polyuria, nocturia,  HEMATOLOGY: No anemia, easy bruising or bleeding SKIN: No rash or lesion. MUSCULOSKELETAL: No joint pain or arthritis.   NEUROLOGIC: No tingling, numbness, weakness.  PSYCHIATRY: No anxiety or depression.   DRUG ALLERGIES:  No Known Allergies  VITALS:  Blood pressure 101/64, pulse 118, temperature 97.9 F (36.6 C), temperature source Axillary, resp. rate 21, height 6' (1.829 m), weight 99.8 kg (220 lb 0.3 oz), SpO2 96 %.  PHYSICAL EXAMINATION:  GENERAL:  75 y.o.-year-old patient lying in the bed with no acute distress.  EYES: Pupils equal, round, reactive to light and accommodation. No scleral icterus. Extraocular muscles intact.  HEENT: Head atraumatic, normocephalic. Oropharynx and nasopharynx clear.  NECK:  Supple, no jugular venous distention. No thyroid enlargement, no tenderness.  LUNGS: Normal breath sounds on right side but decreased BS and crackles on left side,  no wheezing. No use of accessory muscles of respiration.  CARDIOVASCULAR: S1, S2 normal. No murmurs, rubs, or gallops.  ABDOMEN: Soft, nontender, nondistended. Bowel sounds present. No organomegaly or mass.  EXTREMITIES: No pedal edema, cyanosis, or clubbing.  NEUROLOGIC: Cranial nerves II through XII are intact. Muscle strength  4/5 in all extremities. Sensation intact. Gait not checked.  PSYCHIATRIC: The patient is alert and oriented x 3.  SKIN: No obvious rash, lesion, or ulcer.    LABORATORY PANEL:   CBC  Recent Labs Lab 04/18/15 0504  WBC 7.3  HGB 8.7*  HCT 26.4*  PLT 122*   ------------------------------------------------------------------------------------------------------------------  Chemistries   Recent Labs Lab 04/14/15 0836  04/18/15 0504  NA 131*  < > 130*  K 4.0  < > 4.8  CL 105  < > 106  CO2 19*  < > 14*  GLUCOSE 143*  < > 254*  BUN 21*  < > 60*  CREATININE 1.27*  < > 3.11*  CALCIUM 8.2*  < > 7.7*  MG  --   < > 1.9  AST 29  --   --   ALT 21  --   --   ALKPHOS 31*  --   --   BILITOT 0.5  --   --   < > = values in this interval not displayed. 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 EXAM: Portable CHEST 1 VIEW COMPARISON:  03/10/2015 and earlier, including CT chest 01/05/2015 and earlier and PET-CT 09/10/2014. FINDINGS: Since the chest x-ray 5 weeks ago, development of confluent airspace opacities through out the left lung, associated with volume  loss and mediastinal shift to the left. Suboptimal inspiration accounts for mild right basilar atelectasis. Right lung otherwise clear. Prior sternotomy for CABG. Cardiac silhouette normal in size, unchanged. Right jugular Port-A-Cath tip projects over the lower SVC, unchanged.  IMPRESSION: 1. Likely postobstructive atelectasis and pneumonia involving the entire left lung, new since a chest x-ray 5 weeks ago. 2. Suboptimal inspiration accounts for mild right basilar atelectasis. Right lung otherwise clear. Electronically Signed   By: Evangeline Dakin M.D.   On: 04/17/2015 12:30   Dg Chest Port 1 View  04/18/2015  CLINICAL DATA:  Followup postobstructive atelectasis and pneumonia involving the left lung. Current history of left upper lobe lung cancer. EXAM: PORTABLE CHEST 1 VIEW COMPARISON:  04/17/2015 and earlier, including CT chest 01/05/2015. FINDINGS: Sternotomy for CABG. Cardiac silhouette normal in size. Consolidation throughout the left lung with mediastinal shift to the left, unchanged since yesterday. Right lung emphysematous though clear. Right jugular Port-A-Cath tip projects over the lower SVC. IMPRESSION: 1. Stable likely postobstructive atelectasis and pneumonia throughout the entire left lung. 2. Right lung remains clear.  No new abnormalities. Electronically Signed   By: Evangeline Dakin M.D.   On: 04/18/2015 07:59    EKG:   Orders placed or performed during the hospital encounter of 04/17/15  . EKG 12-Lead  . EKG 12-Lead    ASSESSMENT AND PLAN:    75 year old Caucasian gentleman known stage III squamous cell lung carcinoma on Nivolumab with Dr. Jeb Levering presenting in respiratory distress.  1. Acute on chronic respiratory failure with hypoxia:  Try to wean off BiPAP to a high flow nasal cannula as tolerated  * left pneumonitis reaction secondary to nivolumab. continue IV steroids 10 mg Decadron every 8 hours for 24 hours.  Blood culture: Haemophilus influenzae. Repeat chest x-ray shows Stable likely postobstructive atelectasis and pneumonia throughout the entire left lung. Continue vancomycin and change to unasyn.  2. Type 2 diabetes non-insulin-requiring.  hold oral agents and continue insulin sliding.  3. Acute renal failure: Continue  IV fluid,  f/u BMP and check renal ultrasound and nephrology consult.  4. Stage III squamous cell carcinoma lung. F/u oncologist.   * Afib with RVR, treated with cardizem iv one dose, but hypotensive, put on amiodarone drip. continue amiodarone drip. Continue ASA. F/u cardiology consult.  F/u Echo. Start heparin drip.  * Hyponatremia. Continue NS iv, f/u BMP.    I discussed with Dr. Stevenson Clinch.  All the records are reviewed and case discussed with Care Management/Social Workerr. Management plans discussed with the patient, his wife and they are in agreement.  CODE STATUS: full code.  TOTAL CRITICAL TIME TAKING CARE OF THIS PATIENT: 52 minutes.  Greater than 50% time was spent on coordination of care and face-to-face counseling.  POSSIBLE D/C IN >3 DAYS, DEPENDING ON CLINICAL CONDITION.   Demetrios Loll M.D on 04/18/2015 at 8:29 AM  Between 7am to 6pm - Pager - [phone]  After 6pm go to [email or web address] - password [email or web address] [facility]  Office  [phone]  CC: Primary care physician; Albina Billet, MD

## 2015-04-18 NOTE — Progress Notes (Signed)
ANTICOAGULATION CONSULT NOTE - Initial Consult  Pharmacy Consult for Heparin drip dosing and monitoring   Indication: atrial fibrillation  No Known Allergies  Patient Measurements: Height: 6' (182.9 cm) Weight: 220 lb 0.3 oz (99.8 kg) IBW/kg (Calculated) : 77.6  Vital Signs: Temp: 97.9 F (36.6 C) (04/09 0400) Temp Source: Axillary (04/09 0400) BP: 101/64 mmHg (04/09 0800) Pulse Rate: 118 (04/09 0800)   Recent Labs  04/17/15 1205 04/17/15 1929 04/18/15 0112 04/18/15 0504  HGB 10.0*  --   --  8.7*  HCT 30.3*  --   --  26.4*  PLT 154  --   --  122*  CREATININE 3.59* 3.46*  --  3.11*  TROPONINI <0.03 0.06* <0.03 <0.03    Estimated Creatinine Clearance: 25.1 mL/min (by C-G formula based on Cr of 3.11).   Medical History: Past Medical History  Diagnosis Date  . Coronary artery disease   . Pneumonia 2000  . Arthritis   . 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 Bruises easily   . GERD (gastroesophageal reflux disease)     takes Omeprazole daily as needed for stomach pain  . Blood transfusion 2001  . Diabetes mellitus     takes Metformin daily;  . Impaired hearing     bil hearing aide  . Macular degeneration     being watched for this but hasn't been "completely" diagnosed  . Myocardial infarction ([place]) 2001  . Cancer [facility])     Assessment: 75 yo old man with stage III squamous cell lung carcinoma and A. Fib. Pharmacy consulted for heparin dosing and monitoring.   Goal of Therapy:  Heparin level 0.3-0.7 units/ml Monitor platelets by anticoagulation protocol: Yes   Plan:  Give 4000 units bolus x 1 Start heparin infusion at 1100 units/hr Check anti-Xa level in 8 hours and daily while on heparin Continue to monitor H&H and platelets closely.  HL ordered for 1800 today.  Pharmacy will continue to follow and adjust per consult.   Nancy Fetter, PharmD Pharmacy Resident 04/18/2015,9:40 AM

## 2015-04-18 NOTE — Progress Notes (Signed)
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 Past medical history also includes coronary artery disease, recent treatment for pneumonia with Levaquin 7 days, arthritis, chronic back pain, diabetes with macular degeneration and history of myocardial infarction. She per patient and wife at the bedside. Patient received a dose of immunotherapy on 04/14/2015, shortly afterwards started experiencing fever and chills on Wednesday night, along with mild shortness of breath, this morning had an 10 shortness of breath weakness and altered mental status, brought to the ER, found to have saturations in the low 80s, and placed on BiPAP, significant improvement in mentation and oxygenation. Chest x-ray showed left-sided pneumonitis with possible infiltrate versus atelectasis in the left base   SUBJECTIVE: increased WOB; placed on BiPAP overnight; improved WOB this morning. Awake and offers no complaints.   VITAL SIGNS: Temp:  [97 F (36.1 C)-98 F (36.7 C)] 97.9 F (36.6 C) (04/09 0400) Pulse Rate:  [81-126] 117 (04/09 0600) Resp:  [19-30] 19 (04/09 0600) BP: (74-128)/(47-82) 102/58 mmHg (04/09 0600) SpO2:  [88 %-100 %] 97 % (04/09 0600) FiO2 (%):  [30 %] 30 % (04/09 0200) Weight:  [216 lb (97.977 kg)-220 lb 0.3 oz (99.8 kg)] 220 lb 0.3 oz (99.8 kg) (04/08 1419)  PHYSICAL EXAMINATION: General:  Chronically ill looking Neuro: AAO X 3, moves extremities, no focal deficits HEENT: PERRLA, oral mucosa dry and pink Cardiovascular:  RRR, S1/S2, no MRG Lungs:  Normal WOB, bilateral airflow,  diminished in the bases Abdomen: Obese, normal BS, no palpable organomegaly Musculoskeletal: +ROM Skin:  Warm and dry   Recent Labs Lab 04/17/15 1205 04/17/15 1929 04/18/15 0504  NA 130* 131* 130*  K 4.7 4.9 4.8  CL 102 105 106  CO2 15* 13* 14*  BUN 55* 55* 60*  CREATININE 3.59* 3.46* 3.11*  GLUCOSE 112* 141* 254*    Recent Labs Lab 04/14/15 0836 04/17/15 1205 04/18/15 0504  HGB 10.8* 10.0* 8.7*  HCT 32.8* 30.3* 26.4*  WBC 5.8 8.4 7.3  PLT 162 154 122*   Dg Chest 1 View  04/17/2015  CLINICAL DATA:  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 EXAM: Portable CHEST 1 VIEW COMPARISON:  03/10/2015 and earlier, including CT chest 01/05/2015 and earlier and PET-CT 09/10/2014. FINDINGS: Since the chest x-ray 5 weeks ago, development of confluent airspace opacities through out the left lung, associated with volume loss and mediastinal shift to the left. Suboptimal inspiration accounts for mild right basilar atelectasis. Right lung otherwise clear. Prior sternotomy for CABG. Cardiac silhouette normal in size, unchanged. Right jugular Port-A-Cath tip projects over the lower SVC, unchanged. IMPRESSION: 1. Likely postobstructive atelectasis and pneumonia involving the entire left lung, new since a chest x-ray 5 weeks ago. 2. Suboptimal inspiration accounts for mild right basilar atelectasis. Right lung otherwise clear. Electronically Signed   By: Evangeline Dakin M.D.   On: 04/17/2015 12:30    STUDIES:  2-Echo pending  CULTURES: MRSA  screen: neg Blood 04/08> Urine 04/08> nfluenza PCR-NEG  ANTIBIOTICS: Aztreonam 04/08> Zosyn 04/08> Vancomycin 04/08-04/08  SIGNIFICANT EVENTS: 04/08>Admitted with acute hypoxic respiratory failure, and  pneumonia  LINES/TUBES: PIVs  DISCUSSION: Every 89-year-old with stage III squamous cell lung cancer status post chemotherapy and radiation, with persistent left hilar adenopathy, being treated with immunotherapy, now with left-sided pneumonitis and atelectasis  Assessment and plan  PULMONARY Acute hypoxic respiratory failure-improved with BiPAP Pneumonitis Left basilar atelectasis/Partial L lung collapse Possible pneumonia and left base COPD, mild - follow with Dr. Raul Del  P:  -Continue with BiPAP as needed and  transition to high flow nasal cannula -Continue with IV steroids and empiric anabiotic's -Check sputum culture -Influenza PCR-NEG -Maintain O2 saturations greater than 88% -Incentive spirometry -Chest physiotherapy -F/U cxr -Continue scheduled and prn Duoneb  -Transfer back to Dr. Raul Del service once out of stepdown unit  CARDIOVASCULAR A H/O CAD New onset Afib P: -Hemodynamic monitoring per ICU protocol -Continue home meds -Continue Amio gtte -F/U cardiology consult  RENAL A: Chronic renal insufficiency P:  -Monitor urine output -Avoid nephrotoxic drugs -Gentle IV fluids as needed  GASTROINTESTINAL A:  No acute issues P: SUP - PPI  HEMATOLOGIC A: Anemia of Chronic illness P: - monitor CBC  INFECTIOUS A: Pneumonitis L basilar inflitrate Atelectasis P:  - abx as stated above -F/U cultures  ENDOCRINE A: Type 2 DM P:  -Blood glucose monitoring with SSI coverage  NEUROLOGIC RASS goal: 0  Disposition and Family update: Wife at bedside; updated on current treatment plan  Best Practice: Code Status: Full Diet: NPO when on BiPAP GI prophylaxis: PPI VTE prophylaxis:  SCD's /SQ heparin  Total PCCM time=35 minutes  Teasha Murrillo S. [facility] ANP-BC Pulmonary and [place] Pager [phone]   04/18/2015, 6:58 AM

## 2015-04-18 NOTE — Progress Notes (Signed)
Patient removed from [facility] and placed on HFNC 40% at 54GBE without complication.  Will continue to monitor

## 2015-04-18 NOTE — Progress Notes (Signed)
*  PRELIMINARY RESULTS* Echocardiogram 2D Echocardiogram has been performed.  Ivan Beasley 04/18/2015, 11:05 AM

## 2015-04-19 ENCOUNTER — Inpatient Hospital Stay: Payer: Medicare HMO

## 2015-04-19 DIAGNOSIS — R7881 Bacteremia: Secondary | ICD-10-CM

## 2015-04-19 DIAGNOSIS — J14 Pneumonia due to Hemophilus influenzae: Secondary | ICD-10-CM

## 2015-04-19 LAB — CBC
HEMATOCRIT: 25.4 % — AB (ref 40.0–52.0)
HEMOGLOBIN: 8.5 g/dL — AB (ref 13.0–18.0)
MCH: 28.2 pg (ref 26.0–34.0)
MCHC: 33.4 g/dL (ref 32.0–36.0)
MCV: 84.5 fL (ref 80.0–100.0)
Platelets: 136 10*3/uL — ABNORMAL LOW (ref 150–440)
RBC: 3.01 MIL/uL — ABNORMAL LOW (ref 4.40–5.90)
RDW: 17.9 % — ABNORMAL HIGH (ref 11.5–14.5)
WBC: 10.8 10*3/uL — AB (ref 3.8–10.6)

## 2015-04-19 LAB — ECHOCARDIOGRAM COMPLETE
Height: 72 in
WEIGHTICAEL: 3520.31 [oz_av]

## 2015-04-19 LAB — GLUCOSE, CAPILLARY
GLUCOSE-CAPILLARY: 260 mg/dL — AB (ref 65–99)
GLUCOSE-CAPILLARY: 226 mg/dL — AB (ref 65–99)
GLUCOSE-CAPILLARY: 170 mg/dL — AB (ref 65–99)
Glucose-Capillary: 113 mg/dL — ABNORMAL HIGH (ref 65–99)
Glucose-Capillary: 245 mg/dL — ABNORMAL HIGH (ref 65–99)
Glucose-Capillary: 263 mg/dL — ABNORMAL HIGH (ref 65–99)

## 2015-04-19 LAB — PROCALCITONIN: PROCALCITONIN: 8.23 ng/mL

## 2015-04-19 LAB — BASIC METABOLIC PANEL
ANION GAP: 16 — AB (ref 5–15)
BUN: 50 mg/dL — ABNORMAL HIGH (ref 6–20)
CHLORIDE: 99 mmol/L — AB (ref 101–111)
CO2: 13 mmol/L — ABNORMAL LOW (ref 22–32)
Calcium: 6.7 mg/dL — ABNORMAL LOW (ref 8.9–10.3)
Creatinine, Ser: 2.05 mg/dL — ABNORMAL HIGH (ref 0.61–1.24)
GFR calc Af Amer: 35 mL/min — ABNORMAL LOW (ref >60)
GFR, EST NON AFRICAN AMERICAN: 30 mL/min — AB (ref >60)
GLUCOSE: 696 mg/dL — AB (ref 65–99)
POTASSIUM: 3.3 mmol/L — AB (ref 3.5–5.1)
Sodium: 128 mmol/L — ABNORMAL LOW (ref 135–145)

## 2015-04-19 LAB — HEPARIN LEVEL (UNFRACTIONATED)
Heparin Unfractionated: 0.13 IU/mL — ABNORMAL LOW (ref 0.30–0.70)
Heparin Unfractionated: 0.21 IU/mL — ABNORMAL LOW (ref 0.30–0.70)

## 2015-04-19 LAB — URINE CULTURE: CULTURE: NO GROWTH

## 2015-04-19 LAB — MAGNESIUM: Magnesium: 2 mg/dL (ref 1.7–2.4)

## 2015-04-19 MED ORDER — LEVALBUTEROL HCL 0.63 MG/3ML IN NEBU
0.6300 mg | INHALATION_SOLUTION | Freq: Four times a day (QID) | RESPIRATORY_TRACT | Status: DC
  Administered 2015-04-19 – 2015-04-21 (×7): 0.63 mg via RESPIRATORY_TRACT
  Filled 2015-04-19 (×8): qty 3

## 2015-04-19 MED ORDER — METOPROLOL TARTRATE 1 MG/ML IV SOLN
2.5000 mg | INTRAVENOUS | Status: DC | PRN
  Administered 2015-04-19 (×3): 5 mg via INTRAVENOUS
  Administered 2015-04-20: 2.5 mg via INTRAVENOUS
  Administered 2015-04-20 (×3): 5 mg via INTRAVENOUS
  Filled 2015-04-19 (×7): qty 5

## 2015-04-19 MED ORDER — OPTICHAMBER ADVANTAGE MISC: 1.0000 | Freq: Once | Status: DC

## 2015-04-19 MED ORDER — LEVALBUTEROL HCL 1.25 MG/0.5ML IN NEBU
1.2500 mg | INHALATION_SOLUTION | Freq: Four times a day (QID) | RESPIRATORY_TRACT | Status: DC
  Administered 2015-04-19: 1.25 mg via RESPIRATORY_TRACT
  Filled 2015-04-19 (×2): qty 0.5

## 2015-04-19 MED ORDER — SODIUM CHLORIDE 0.9 % IV SOLN
3.0000 g | Freq: Four times a day (QID) | INTRAVENOUS | Status: DC
  Administered 2015-04-19 – 2015-04-20 (×4): 3 g via INTRAVENOUS
  Filled 2015-04-19 (×7): qty 3

## 2015-04-19 MED ORDER — SODIUM BICARBONATE 8.4 % IV SOLN
INTRAVENOUS | Status: DC
  Administered 2015-04-19: 11:00:00 via INTRAVENOUS
  Filled 2015-04-19 (×5): qty 850

## 2015-04-19 MED ORDER — IPRATROPIUM BROMIDE 0.02 % IN SOLN
0.5000 mg | Freq: Four times a day (QID) | RESPIRATORY_TRACT | Status: DC
  Administered 2015-04-19 – 2015-04-21 (×7): 0.5 mg via RESPIRATORY_TRACT
  Filled 2015-04-19 (×8): qty 2.5

## 2015-04-19 MED ORDER — HEPARIN BOLUS VIA INFUSION
3000.0000 [IU] | Freq: Once | INTRAVENOUS | Status: DC
  Filled 2015-04-19: qty 3000

## 2015-04-19 MED ORDER — LEVALBUTEROL HCL 0.63 MG/3ML IN NEBU
0.6300 mg | INHALATION_SOLUTION | RESPIRATORY_TRACT | Status: DC | PRN
Start: 2015-04-19 — End: 2015-04-23
  Administered 2015-04-21 – 2015-04-22 (×2): 0.63 mg via RESPIRATORY_TRACT
  Filled 2015-04-19 (×3): qty 3

## 2015-04-19 MED ORDER — INSULIN GLARGINE 100 UNIT/ML ~~LOC~~ SOLN
20.0000 [IU] | Freq: Every day | SUBCUTANEOUS | Status: DC
  Administered 2015-04-19 – 2015-04-23 (×5): 20 [IU] via SUBCUTANEOUS
  Filled 2015-04-19 (×5): qty 0.2

## 2015-04-19 MED ORDER — BUDESONIDE 0.5 MG/2ML IN SUSP
0.5000 mg | Freq: Two times a day (BID) | RESPIRATORY_TRACT | Status: DC
  Administered 2015-04-19 – 2015-04-21 (×5): 0.5 mg via RESPIRATORY_TRACT
  Filled 2015-04-19 (×5): qty 2

## 2015-04-19 MED ORDER — OPTICHAMBER DIAMOND MISC
1.0000 | Freq: Once | Status: AC
  Administered 2015-04-19: 1
  Filled 2015-04-19: qty 1

## 2015-04-19 MED ORDER — HEPARIN BOLUS VIA INFUSION
1500.0000 [IU] | Freq: Once | INTRAVENOUS | Status: AC
  Administered 2015-04-19: 1500 [IU] via INTRAVENOUS
  Filled 2015-04-19: qty 1500

## 2015-04-19 MED ORDER — AMIODARONE HCL 200 MG PO TABS
400.0000 mg | ORAL_TABLET | Freq: Two times a day (BID) | ORAL | Status: DC
  Administered 2015-04-19 – 2015-04-23 (×9): 400 mg via ORAL
  Filled 2015-04-19 (×8): qty 2
  Filled 2015-04-19: qty 1
  Filled 2015-04-19: qty 2

## 2015-04-19 NOTE — Care Management (Signed)
Patient admitted to icu stepdown due to the need for continuous bipap. he is diagnosed with pneumonitis- most likely from his chem (last received 4/5).  Entire left lung is affective.   Currently he is on HFNC.  Patient lives with his wife and is currently undergoing chemo treatments via the [facility] for lung cancer.  He does not have home 02.  Blood culture positive for haemophilus.  Creatinine is at least 2.5 times this baseline.  There is a very high probability patient will need home 02 and patient and wife interested in a home nebulizer machine with nebulizer solutions. Patient has difficultly using hand held inhalers and does not feel they are as effective as nebulizer solutions.   During progression discussed the need to mobilize patient with respiratory status is more stable.

## 2015-04-19 NOTE — Progress Notes (Signed)
[facility]  Telephone:([phone]) [phone] [phone]  ID: [name] OB: [birth year]  MR#: [id number]  ENI#:[id number]  Patient Care Team: Albina Billet, MD as PCP - General (Internal Medicine)  CHIEF COMPLAINT:  Chief Complaint  Patient presents with  . Shortness of Breath    INTERVAL HISTORY: Patient's breathing improved and he is now on 3 L nasal cannula. He tires easily and will remain in the intensive care unit overnight for monitoring. His mental status is back to normal. He has had no further fevers. Patient offers no further complaints.  REVIEW OF SYSTEMS:   Review of Systems  Constitutional: Positive for malaise/fatigue. Negative for fever.  Respiratory: Positive for shortness of breath.   Neurological: Positive for weakness.    As per HPI. Otherwise, a complete review of systems is negatve.  PAST MEDICAL HISTORY: Past Medical History  Diagnosis Date  . Coronary artery disease   . Pneumonia 2000  . Arthritis   . 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 Bruises easily   . GERD (gastroesophageal reflux disease)     takes Omeprazole daily as needed for stomach pain  . Blood transfusion 2001  . Diabetes mellitus     takes Metformin daily;  . Impaired hearing     bil hearing aide  . Macular degeneration     being watched for this but hasn't been "completely" diagnosed  . Myocardial infarction ([place]) 2001  . Cancer [facility])     PAST SURGICAL HISTORY: Past Surgical History  Procedure Laterality Date  . 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 Cardiac catheterization  2001  . Tonsillectomy      as a child  . Colonoscopy    . Cataract extraction      bilateral  . Lumbar laminectomy/decompression microdiscectomy  12/19/2010    Procedure: LUMBAR LAMINECTOMY/DECOMPRESSION MICRODISCECTOMY;  Surgeon: Elaina Hoops;  Location: [facility];  Service: Neurosurgery;  Laterality: N/A;  Lumbar three-four, four-five decompressive  lumbar laminectomy  . Joint replacement  right tkr  . Eye surgery      cataracts  . Back surgery      l4 5  . Portacath placement N/A 05/20/2014    Procedure: INSERTION PORT-A-CATH;  Surgeon: Nestor Lewandowsky, MD;  Location: [facility] ORS;  Service: General;  Laterality: N/A;    FAMILY HISTORY Family History  Problem Relation Age of Onset  . Anesthesia problems Neg Hx   . Hypotension Neg Hx   . Malignant hyperthermia Neg Hx   . Pseudochol deficiency Neg Hx   . Diabetes type II Other        ADVANCED DIRECTIVES:    HEALTH MAINTENANCE: Social History  Substance Use Topics  . Smoking status: Former Smoker -- 40 years    Types: Cigarettes    Quit date: 05/19/1999  . Smokeless tobacco: Never Used     Comment: 2001  . Alcohol Use: No     Colonoscopy:  PAP:  Bone density:  Lipid panel:  No Known Allergies  Current Facility-Administered Medications  Medication Dose Route Frequency Provider Last Rate Last Dose  . acetaminophen (TYLENOL) tablet 650 mg  650 mg Oral Q6H PRN [name], MD       Or  . acetaminophen (TYLENOL) suppository 650 mg  650 mg Rectal Q6H PRN [name], MD      . amiodarone (PACERONE) tablet 400 mg  400 mg Oral BID  Wilhelmina Mcardle, MD   400 mg at 04/19/15 2130  . Ampicillin-Sulbactam (UNASYN) 3 g in sodium chloride 0.9 % 100 mL IVPB  3 g Intravenous Q6H Demetrios Loll, MD   3 g at 04/19/15 2135  . antiseptic oral rinse (CPC / CETYLPYRIDINIUM CHLORIDE 0.05%) solution 7 mL  7 mL Mouth Rinse q12n4p Vishal Mungal, MD   7 mL at 04/19/15 1650  . aspirin EC tablet 81 mg  81 mg Oral Daily [name], MD   81 mg at 04/19/15 1047  . budesonide (PULMICORT) nebulizer solution 0.5 mg  0.5 mg Nebulization BID Wilhelmina Mcardle, MD   0.5 mg at 04/19/15 1949  . chlorhexidine (PERIDEX) 0.12 % solution 15 mL  15 mL Mouth Rinse BID Vishal Mungal, MD   15 mL at 04/19/15 2143  . chlorpheniramine-HYDROcodone (TUSSIONEX) 10-8 MG/5ML suspension 5 mL  5 mL Oral Q12H PRN [name],  MD   5 mL at 04/19/15 2133  . heparin ADULT infusion 100 units/mL (25000 units/250 mL)  1,600 Units/hr Intravenous Continuous Demetrios Loll, MD 16 mL/hr at 04/19/15 2018 1,600 Units/hr at 04/19/15 2018  . heparin bolus via infusion 3,000 Units  3,000 Units Intravenous Once Demetrios Loll, MD   3,000 Units at 04/19/15 1126  . insulin aspart (novoLOG) injection 0-20 Units  0-20 Units Subcutaneous TID WC [name], MD   7 Units at 04/19/15 1657  . insulin aspart (novoLOG) injection 0-5 Units  0-5 Units Subcutaneous QHS [name], MD   3 Units at 04/18/15 2131  . insulin glargine (LANTUS) injection 20 Units  20 Units Subcutaneous Daily Wilhelmina Mcardle, MD   20 Units at 04/19/15 1111  . ipratropium (ATROVENT) nebulizer solution 0.5 mg  0.5 mg Nebulization Q6H Wilhelmina Mcardle, MD   0.5 mg at 04/19/15 1949  . levalbuterol (XOPENEX) nebulizer solution 0.63 mg  0.63 mg Nebulization Q3H PRN Wilhelmina Mcardle, MD      . levalbuterol [facility]) nebulizer solution 0.63 mg  0.63 mg Nebulization 4 times per day Wilhelmina Mcardle, MD   0.63 mg at 04/19/15 1949  . magnesium oxide (MAG-OX) tablet 400 mg  400 mg Oral Daily [name], MD   400 mg at 04/19/15 1047  . metoprolol (LOPRESSOR) injection 2.5-5 mg  2.5-5 mg Intravenous Q3H PRN Wilhelmina Mcardle, MD   5 mg at 04/19/15 2115  . morphine 2 MG/ML injection 2 mg  2 mg Intravenous Q4H PRN [name], MD   2 mg at 04/19/15 0428  . multivitamin-lutein (OCUVITE-LUTEIN) capsule 1 capsule  1 capsule Oral Daily [name], MD   1 capsule at 04/19/15 1047  . nystatin (MYCOSTATIN) 100000 UNIT/ML suspension 500,000 Units  5 mL Oral QID [name], MD   500,000 Units at 04/19/15 2144  . ondansetron (ZOFRAN) tablet 4 mg  4 mg Oral Q6H PRN [name], MD       Or  . ondansetron [facility]) injection 4 mg  4 mg Intravenous Q6H PRN [name], MD      . oxyCODONE (Oxy IR/ROXICODONE) immediate release tablet 5 mg  5 mg Oral Q4H PRN [name], MD      . pantoprazole  (PROTONIX) EC tablet 40 mg  40 mg Oral Daily [name], MD   40 mg at 04/19/15 1047  . simvastatin (ZOCOR) tablet 40 mg  40 mg Oral QHS [name], MD   40 mg  at 04/19/15 2130  . sodium bicarbonate 150 mEq in sterile water 1,000 mL infusion   Intravenous Continuous Munsoor Lateef, MD 75 mL/hr at 04/19/15 2000    . sodium chloride flush (NS) 0.9 % injection 10-40 mL  10-40 mL Intracatheter PRN Demetrios Loll, MD      . sodium chloride flush (NS) 0.9 % injection 3 mL  3 mL Intravenous Q12H [name], MD   3 mL at 04/19/15 2200  . traMADol (ULTRAM) tablet 50 mg  50 mg Oral Q6H PRN [name], MD       Facility-Administered Medications Ordered in Other Encounters  Medication Dose Route Frequency Provider Last Rate Last Dose  . 0.9 %  sodium chloride infusion   Intravenous Continuous Forest Gleason, MD   Stopped at 12/11/14 1034  . lidocaine-prilocaine (EMLA) cream   Topical Once Evlyn Kanner, NP      . sodium chloride 0.9 % injection 10 mL  10 mL Intracatheter PRN Forest Gleason, MD   10 mL at 06/03/14 1106  . sodium chloride 0.9 % injection 10 mL  10 mL Intravenous PRN Forest Gleason, MD   10 mL at 07/30/14 0920  . sodium chloride 0.9 % injection 10 mL  10 mL Intracatheter PRN Forest Gleason, MD   10 mL at 12/11/14 0912    OBJECTIVE: Filed Vitals:   04/19/15 2200 04/19/15 2300  BP: 153/85 139/79  Pulse: 122 118  Temp:    Resp: 29 20     Body mass index is 29.83 kg/(m^2).    ECOG FS:3 - Symptomatic, >50% confined to bed  General: Well-developed, well-nourished, no acute distress. Eyes: Pink conjunctiva, anicteric sclera. Lungs: Clear to auscultation bilaterally. Heart: Regular rate and rhythm. No rubs, murmurs, or gallops. Abdomen: Soft, nontender, nondistended. No organomegaly noted, normoactive bowel sounds. Musculoskeletal: No edema, cyanosis, or clubbing. Neuro: Alert, answering all questions appropriately. Cranial nerves grossly intact. Skin: No rashes or petechiae  noted. Psych: Normal affect.  LAB RESULTS:  Lab Results  Component Value Date   NA 128* 04/19/2015   K 3.3* 04/19/2015   CL 99* 04/19/2015   CO2 13* 04/19/2015   GLUCOSE 696* 04/19/2015   BUN 50* 04/19/2015   CREATININE 2.05* 04/19/2015   CALCIUM 6.7* 04/19/2015   PROT 7.2 04/14/2015   ALBUMIN 3.5 04/14/2015   AST 29 04/14/2015   ALT 21 04/14/2015   ALKPHOS 31* 04/14/2015   BILITOT 0.5 04/14/2015   GFRNONAA 30* 04/19/2015   GFRAA 35* 04/19/2015    Lab Results  Component Value Date   WBC 10.8* 04/19/2015   NEUTROABS 7.5* 04/17/2015   HGB 8.5* 04/19/2015   HCT 25.4* 04/19/2015   MCV 84.5 04/19/2015   PLT 136* 04/19/2015     STUDIES: Dg Chest 1 View  04/17/2015  CLINICAL DATA:  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 EXAM: Portable CHEST 1 VIEW COMPARISON:  03/10/2015 and earlier, including CT chest 01/05/2015 and earlier and PET-CT 09/10/2014. FINDINGS: Since the chest x-ray 5 weeks ago, development of confluent airspace opacities through out the left lung, associated with volume loss and mediastinal shift to the left. Suboptimal inspiration accounts for mild right basilar atelectasis. Right lung otherwise clear. Prior sternotomy for CABG. Cardiac silhouette normal in size, unchanged. Right jugular Port-A-Cath tip projects over the lower  SVC, unchanged. IMPRESSION: 1. Likely postobstructive atelectasis and pneumonia involving the entire left lung, new since a chest x-ray 5 weeks ago. 2. Suboptimal inspiration accounts for mild right basilar atelectasis. Right lung otherwise clear. Electronically Signed   By: Evangeline Dakin M.D.   On: 04/17/2015 12:30   US Renal  04/18/2015  CLINICAL DATA:  Acute renal failure EXAM: RENAL / URINARY TRACT ULTRASOUND COMPLETE COMPARISON:   None FINDINGS: Right Kidney: Length: 12.1 cm. Echogenicity within normal limits. No mass or hydronephrosis visualized. Left Kidney: Length: 12.3 cm. Normal parenchymal echogenicity. No obstructive uropathy. There is a cyst within the inferior pole which measure 3.4 x 3.8 x 3.4 cm. Bladder: Appears normal for degree of bladder distention. IMPRESSION: 1. No acute findings.  No hydronephrosis 2. Left renal cyst. Electronically Signed   By: Kerby Moors M.D.   On: 04/18/2015 11:57   Dg Chest Port 1 View  04/19/2015  CLINICAL DATA:  Acute on chronic respiratory failure, healthcare associated pneumonia. EXAM: PORTABLE CHEST 1 VIEW COMPARISON:  Portable chest x-ray of May 18, 2015 FINDINGS: The right lung is well-expanded. Mildly increased interstitial prominence has developed in the midportion of the right lung. On the left there is persistent confluent alveolar opacity in the upper and lower lobes. The hemidiaphragm remains obscured. There is no mediastinal shift. The cardiac silhouette is top-normal in size. There are post CABG changes. There are multiple broken upper sternal wires. The Port-A-Cath appliance tip projects over the midportion of the SVC. IMPRESSION: Stable appearance of the left lung consistent with postobstructive atelectasis and pneumonia. On the right slightly increased interstitial density in the mid lung may reflect developing interstitial pneumonia or edema. Electronically Signed   By: David  Martinique M.D.   On: 04/19/2015 07:23   Dg Chest Port 1 View  04/18/2015  CLINICAL DATA:  Followup postobstructive atelectasis and pneumonia involving the left lung. Current history of left upper lobe lung cancer. EXAM: PORTABLE CHEST 1 VIEW COMPARISON:  04/17/2015 and earlier, including CT chest 01/05/2015. FINDINGS: Sternotomy for CABG. Cardiac silhouette normal in size. Consolidation throughout the left lung with mediastinal shift to the left, unchanged since yesterday. Right lung emphysematous though  clear. Right jugular Port-A-Cath tip projects over the lower SVC. IMPRESSION: 1. Stable likely postobstructive atelectasis and pneumonia throughout the entire left lung. 2. Right lung remains clear.  No new abnormalities. Electronically Signed   By: Evangeline Dakin M.D.   On: 04/18/2015 07:59    ASSESSMENT: Stage IV squamous cell carcinoma of the lung now with pneumonitis and bacteremia.  PLAN:    1. Lung cancer: Patient received his most recent infusion of immunotherapy on April 14, 2015. His pneumonitis and shortness of breath can be attributed to his nivolumab. Continue supportive care with oxygenation as well as IV steroids. No further treatments are planned at this time. Patient has been instructed to keep his previously scheduled follow-up for PET scan and M.D. evaluation in approximately 2 weeks. 2. Pneumonitis: IV steroids as above. 3. Bacteremia: Blood cultures 1 of 2 positive for Haemophilus influenza.  Agree with current antibiotics. 4. Anemia: Multifactorial, stable. Patient does not require blood transfusion at this time. 5. Thrombocytopenia: Mild, monitor. 6. Acute renal failure: Multifactorial. Continue IV fluid hydration. Appreciate nephrology input. 7. Diarrhea: Resolved. Secondary to nivolumab as above.  I will be out of town the remainder of the week, please call Dr. Rogue Bussing or the physician on call if there are any questions or concerns.   Lloyd Huger, MD  04/19/2015 11:17 PM

## 2015-04-19 NOTE — Consult Note (Signed)
Reason for Consult: Atrial fibrillation shortness of breath Referring Physician: Benita Stabile primary [place] hospitalist  Ivan Beasley is an 75 y.o. male.  HPI: Patient's a 75 year old male recent diagnosis lung cancer left side appears to be a postobstructive pneumonia complains of shortness of breath dyspnea we have had a reaction to his chemotherapy medications including nausea vomiting mild hypotension. Patient is on oxygen now denies significant chest pain on inhalers has diabetes with complaint of congestion and cough mild wheezing minimal sputum production. Patient had low-grade temperature weakness fatigue presents for cardiac evaluation because of rapid atrial fibrillation new-onset  Past Medical History  Diagnosis Date  . Coronary artery disease   . Pneumonia 2000  . Arthritis   . 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 Bruises easily   . GERD (gastroesophageal reflux disease)     takes Omeprazole daily as needed for stomach pain  . Blood transfusion 2001  . Diabetes mellitus     takes Metformin daily;  . Impaired hearing     bil hearing aide  . Macular degeneration     being watched for this but hasn't been "completely" diagnosed  . Myocardial infarction ([place]) 2001  . Cancer [facility])     Past Surgical History  Procedure Laterality Date  . 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 Cardiac catheterization  2001  . Tonsillectomy      as a child  . Colonoscopy    . Cataract extraction      bilateral  . Lumbar laminectomy/decompression microdiscectomy  12/19/2010    Procedure: LUMBAR LAMINECTOMY/DECOMPRESSION MICRODISCECTOMY;  Surgeon: Elaina Hoops;  Location: [place] [facility];  Service: Neurosurgery;  Laterality: N/A;  Lumbar three-four, four-five decompressive lumbar laminectomy  . Joint replacement  right tkr  . Eye surgery      cataracts  . Back surgery      l4 5  . Portacath placement N/A 05/20/2014    Procedure: INSERTION PORT-A-CATH;   Surgeon: Nestor Lewandowsky, MD;  Location: [facility] ORS;  Service: General;  Laterality: N/A;    Family History  Problem Relation Age of Onset  . Anesthesia problems Neg Hx   . Hypotension Neg Hx   . Malignant hyperthermia Neg Hx   . Pseudochol deficiency Neg Hx   . Diabetes type II Other     Social History:  reports that he quit smoking about 15 years ago. His smoking use included Cigarettes. He quit after 40 years of use. He has never used smokeless tobacco. He reports that he does not drink alcohol or use illicit drugs.  Allergies: No Known Allergies  Medications: I have reviewed the patient's current medications.  Results for orders placed or performed during the hospital encounter of 04/17/15 (from the past 48 hour(s))  Glucose, capillary     Status: Abnormal   Collection Time: 04/17/15  2:16 PM  Result Value Ref Range   Glucose-Capillary 113 (H) 65 - 99 mg/dL  MRSA PCR Screening     Status: None   Collection Time: 04/17/15  2:19 PM  Result Value Ref Range   MRSA by PCR NEGATIVE NEGATIVE    Comment:        The GeneXpert MRSA Assay (FDA approved for NASAL specimens only), is one component of a comprehensive MRSA colonization surveillance program. It is not intended to diagnose MRSA infection nor to guide or monitor treatment for MRSA infections.   Influenza panel by  PCR (type A & B, H1N1)     Status: None   Collection Time: 04/17/15  2:49 PM  Result Value Ref Range   Influenza A By PCR NEGATIVE NEGATIVE   Influenza B By PCR NEGATIVE NEGATIVE   H1N1 flu by pcr NOT DETECTED NOT DETECTED    Comment:        The Xpert Flu assay (FDA approved for nasal aspirates or washes and nasopharyngeal swab specimens), is intended as an aid in the diagnosis of influenza and should not be used as a sole basis for treatment.   Lactic acid, plasma     Status: Abnormal   Collection Time: 04/17/15  2:49 PM  Result Value Ref Range   Lactic Acid, Venous 6.4 (HH) 0.5 - 2.0 mmol/L     Comment: CRITICAL RESULT CALLED TO, READ BACK BY AND VERIFIED WITH [place] Rudd @ [phone] 04/17/15 by [place]   Glucose, capillary     Status: Abnormal   Collection Time: 04/17/15  4:21 PM  Result Value Ref Range   Glucose-Capillary 137 (H) 65 - 99 mg/dL  Urine culture     Status: None   Collection Time: 04/17/15  7:29 PM  Result Value Ref Range   Specimen Description URINE, RANDOM    Special Requests NONE    Culture NO GROWTH 1 DAY    Report Status 04/19/2015 FINAL   Urinalysis complete, with microscopic ([facility] only)     Status: Abnormal   Collection Time: 04/17/15  7:29 PM  Result Value Ref Range   Color, Urine YELLOW (A) YELLOW   APPearance CLOUDY (A) CLEAR   Glucose, UA NEGATIVE NEGATIVE mg/dL   Bilirubin Urine NEGATIVE NEGATIVE   Ketones, ur NEGATIVE NEGATIVE mg/dL   Specific Gravity, Urine 1.012 1.005 - 1.030   Hgb urine dipstick 2+ (A) NEGATIVE   pH 5.0 5.0 - 8.0   Protein, ur 30 (A) NEGATIVE mg/dL   Nitrite NEGATIVE NEGATIVE   Leukocytes, UA NEGATIVE NEGATIVE   RBC / HPF 6-30 0 - 5 RBC/hpf   WBC, UA 0-5 0 - 5 WBC/hpf   Bacteria, UA RARE (A) NONE SEEN   Squamous Epithelial / LPF 0-5 (A) NONE SEEN   Mucous PRESENT    Granular Casts, UA PRESENT    Amorphous Crystal PRESENT   Troponin I     Status: Abnormal   Collection Time: 04/17/15  7:29 PM  Result Value Ref Range   Troponin I 0.06 (H) <0.031 ng/mL    Comment: READ BACK AND VERIFIED WITH BRITTANY TRAVEIS AT 2044 04/17/15.PMH        PERSISTENTLY INCREASED TROPONIN VALUES IN THE RANGE OF 0.04-0.49 ng/mL CAN BE SEEN IN:       -UNSTABLE ANGINA       -CONGESTIVE HEART FAILURE       -MYOCARDITIS       -CHEST TRAUMA       -ARRYHTHMIAS       -LATE PRESENTING MYOCARDIAL INFARCTION       -COPD   CLINICAL FOLLOW-UP RECOMMENDED.   Basic metabolic panel     Status: Abnormal   Collection Time: 04/17/15  7:29 PM  Result Value Ref Range   Sodium 131 (L) 135 - 145 mmol/L   Potassium 4.9 3.5 - 5.1 mmol/L   Chloride 105 101 - 111  mmol/L   CO2 13 (L) 22 - 32 mmol/L   Glucose, Bld 141 (H) 65 - 99 mg/dL   BUN 55 (H) 6 - 20 mg/dL  Creatinine, Ser 3.46 (H) 0.61 - 1.24 mg/dL   Calcium 7.9 (L) 8.9 - 10.3 mg/dL   GFR calc non Af Amer 16 (L) >60 mL/min   GFR calc Af Amer 18 (L) >60 mL/min    Comment: (NOTE) The eGFR has been calculated using the CKD EPI equation. This calculation has not been validated in all clinical situations. eGFR's persistently <60 mL/min signify possible Chronic Kidney Disease.    Anion gap 13 5 - 15  Magnesium     Status: None   Collection Time: 04/17/15  7:29 PM  Result Value Ref Range   Magnesium 1.7 1.7 - 2.4 mg/dL  Glucose, capillary     Status: Abnormal   Collection Time: 04/17/15  9:33 PM  Result Value Ref Range   Glucose-Capillary 195 (H) 65 - 99 mg/dL  Troponin I     Status: None   Collection Time: 04/18/15  1:12 AM  Result Value Ref Range   Troponin I <0.03 <0.031 ng/mL    Comment:        NO INDICATION OF MYOCARDIAL INJURY.   Troponin I     Status: None   Collection Time: 04/18/15  5:04 AM  Result Value Ref Range   Troponin I <0.03 <0.031 ng/mL    Comment:        NO INDICATION OF MYOCARDIAL INJURY.   Magnesium     Status: None   Collection Time: 04/18/15  5:04 AM  Result Value Ref Range   Magnesium 1.9 1.7 - 2.4 mg/dL  Phosphorus     Status: None   Collection Time: 04/18/15  5:04 AM  Result Value Ref Range   Phosphorus 4.2 2.5 - 4.6 mg/dL  Basic metabolic panel     Status: Abnormal   Collection Time: 04/18/15  5:04 AM  Result Value Ref Range   Sodium 130 (L) 135 - 145 mmol/L   Potassium 4.8 3.5 - 5.1 mmol/L   Chloride 106 101 - 111 mmol/L   CO2 14 (L) 22 - 32 mmol/L   Glucose, Bld 254 (H) 65 - 99 mg/dL   BUN 60 (H) 6 - 20 mg/dL   Creatinine, Ser 3.11 (H) 0.61 - 1.24 mg/dL   Calcium 7.7 (L) 8.9 - 10.3 mg/dL   GFR calc non Af Amer 18 (L) >60 mL/min   GFR calc Af Amer 21 (L) >60 mL/min    Comment: (NOTE) The eGFR has been calculated using the CKD EPI  equation. This calculation has not been validated in all clinical situations. eGFR's persistently <60 mL/min signify possible Chronic Kidney Disease.    Anion gap 10 5 - 15  CBC     Status: Abnormal   Collection Time: 04/18/15  5:04 AM  Result Value Ref Range   WBC 7.3 3.8 - 10.6 K/uL   RBC 3.18 (L) 4.40 - 5.90 MIL/uL   Hemoglobin 8.7 (L) 13.0 - 18.0 g/dL   HCT 26.4 (L) 40.0 - 52.0 %   MCV 83.1 80.0 - 100.0 fL   MCH 27.3 26.0 - 34.0 pg   MCHC 32.8 32.0 - 36.0 g/dL   RDW 17.5 (H) 11.5 - 14.5 %   Platelets 122 (L) 150 - 440 K/uL  Procalcitonin     Status: None   Collection Time: 04/18/15  5:04 AM  Result Value Ref Range   Procalcitonin 17.88 ng/mL    Comment:        Interpretation: PCT >= 10 ng/mL: Important systemic inflammatory response, almost exclusively due to severe  bacterial sepsis or septic shock. (NOTE)         ICU PCT Algorithm               Non ICU PCT Algorithm    ----------------------------     ------------------------------         PCT < 0.25 ng/mL                 PCT < 0.1 ng/mL     Stopping of antibiotics            Stopping of antibiotics       strongly encouraged.               strongly encouraged.    ----------------------------     ------------------------------       PCT level decrease by               PCT < 0.25 ng/mL       >= 80% from peak PCT       OR PCT 0.25 - 0.5 ng/mL          Stopping of antibiotics                                             encouraged.     Stopping of antibiotics           encouraged.    ----------------------------     ------------------------------       PCT level decrease by              PCT >= 0.25 ng/mL       < 80% from peak PCT        AND PCT >= 0.5 ng/mL             Continuing antibiotics                                              encouraged.       Continuing antibiotics            encouraged.    ----------------------------     ------------------------------     PCT level increase compared          PCT > 0.5  ng/mL         with peak PCT AND          PCT >= 0.5 ng/mL             Escalation of antibiotics                                          strongly encouraged.      Escalation of antibiotics        strongly encouraged.   Glucose, capillary     Status: Abnormal   Collection Time: 04/18/15  7:02 AM  Result Value Ref Range   Glucose-Capillary 277 (H) 65 - 99 mg/dL  APTT     Status: Abnormal   Collection Time: 04/18/15  9:39 AM  Result Value Ref Range   aPTT 39 (H) 24 - 36 seconds    Comment:        IF  BASELINE aPTT IS ELEVATED, SUGGEST PATIENT RISK ASSESSMENT BE USED TO DETERMINE APPROPRIATE ANTICOAGULANT THERAPY.   Protime-INR     Status: Abnormal   Collection Time: 04/18/15  9:39 AM  Result Value Ref Range   Prothrombin Time 16.9 (H) 11.4 - 15.0 seconds   INR 1.36   Glucose, capillary     Status: Abnormal   Collection Time: 04/18/15 10:58 AM  Result Value Ref Range   Glucose-Capillary 247 (H) 65 - 99 mg/dL  Glucose, capillary     Status: Abnormal   Collection Time: 04/18/15  4:09 PM  Result Value Ref Range   Glucose-Capillary 214 (H) 65 - 99 mg/dL  Heparin level (unfractionated)     Status: Abnormal   Collection Time: 04/18/15  6:33 PM  Result Value Ref Range   Heparin Unfractionated 0.20 (L) 0.30 - 0.70 IU/mL    Comment:        IF HEPARIN RESULTS ARE BELOW EXPECTED VALUES, AND PATIENT DOSAGE HAS BEEN CONFIRMED, SUGGEST FOLLOW UP TESTING OF ANTITHROMBIN III LEVELS.   Troponin I     Status: None   Collection Time: 04/18/15  6:33 PM  Result Value Ref Range   Troponin I <0.03 <0.031 ng/mL    Comment:        NO INDICATION OF MYOCARDIAL INJURY.   CKMB([facility] only)     Status: Abnormal   Collection Time: 04/18/15  6:33 PM  Result Value Ref Range   CK, MB 5.8 (H) 0.5 - 5.0 ng/mL  CK     Status: None   Collection Time: 04/18/15  6:33 PM  Result Value Ref Range   Total CK 68 49 - 397 U/L  Glucose, capillary     Status: Abnormal   Collection Time: 04/18/15  9:00 PM   Result Value Ref Range   Glucose-Capillary 251 (H) 65 - 99 mg/dL  Procalcitonin     Status: None   Collection Time: 04/19/15  5:39 AM  Result Value Ref Range   Procalcitonin 8.23 ng/mL    Comment:        Interpretation: PCT > 2 ng/mL: Systemic infection (sepsis) is likely, unless other causes are known. (NOTE)         ICU PCT Algorithm               Non ICU PCT Algorithm    ----------------------------     ------------------------------         PCT < 0.25 ng/mL                 PCT < 0.1 ng/mL     Stopping of antibiotics            Stopping of antibiotics       strongly encouraged.               strongly encouraged.    ----------------------------     ------------------------------       PCT level decrease by               PCT < 0.25 ng/mL       >= 80% from peak PCT       OR PCT 0.25 - 0.5 ng/mL          Stopping of antibiotics  encouraged.     Stopping of antibiotics           encouraged.    ----------------------------     ------------------------------       PCT level decrease by              PCT >= 0.25 ng/mL       < 80% from peak PCT        AND PCT >= 0.5 ng/mL            Continuing antibiotics                                               encouraged.       Continuing antibiotics            encouraged.    ----------------------------     ------------------------------     PCT level increase compared          PCT > 0.5 ng/mL         with peak PCT AND          PCT >= 0.5 ng/mL             Escalation of antibiotics                                          strongly encouraged.      Escalation of antibiotics        strongly encouraged.   Basic metabolic panel     Status: Abnormal   Collection Time: 04/19/15  5:39 AM  Result Value Ref Range   Sodium 128 (L) 135 - 145 mmol/L   Potassium 3.3 (L) 3.5 - 5.1 mmol/L   Chloride 99 (L) 101 - 111 mmol/L   CO2 13 (L) 22 - 32 mmol/L   Glucose, Bld 696 (HH) 65 - 99 mg/dL    Comment:  CRITICAL RESULT CALLED TO, READ BACK BY AND VERIFIED WITH MICHELLE WILLIAMS @ 0636 ON 04/19/2015 BY [facility]    BUN 50 (H) 6 - 20 mg/dL   Creatinine, Ser 2.05 (H) 0.61 - 1.24 mg/dL   Calcium 6.7 (L) 8.9 - 10.3 mg/dL   GFR calc non Af Amer 30 (L) >60 mL/min   GFR calc Af Amer 35 (L) >60 mL/min    Comment: (NOTE) The eGFR has been calculated using the CKD EPI equation. This calculation has not been validated in all clinical situations. eGFR's persistently <60 mL/min signify possible Chronic Kidney Disease.    Anion gap 16 (H) 5 - 15  CBC     Status: Abnormal   Collection Time: 04/19/15  5:39 AM  Result Value Ref Range   WBC 10.8 (H) 3.8 - 10.6 K/uL   RBC 3.01 (L) 4.40 - 5.90 MIL/uL   Hemoglobin 8.5 (L) 13.0 - 18.0 g/dL   HCT 25.4 (L) 40.0 - 52.0 %   MCV 84.5 80.0 - 100.0 fL   MCH 28.2 26.0 - 34.0 pg   MCHC 33.4 32.0 - 36.0 g/dL   RDW 17.9 (H) 11.5 - 14.5 %   Platelets 136 (L) 150 - 440 K/uL  Magnesium     Status: None   Collection Time: 04/19/15  5:39 AM  Result Value Ref Range   Magnesium 2.0 1.7 -  2.4 mg/dL  Glucose, capillary     Status: Abnormal   Collection Time: 04/19/15  6:42 AM  Result Value Ref Range   Glucose-Capillary 245 (H) 65 - 99 mg/dL  Glucose, capillary     Status: Abnormal   Collection Time: 04/19/15  7:47 AM  Result Value Ref Range   Glucose-Capillary 263 (H) 65 - 99 mg/dL  Heparin level (unfractionated)     Status: Abnormal   Collection Time: 04/19/15 10:14 AM  Result Value Ref Range   Heparin Unfractionated 0.13 (L) 0.30 - 0.70 IU/mL    Comment:        IF HEPARIN RESULTS ARE BELOW EXPECTED VALUES, AND PATIENT DOSAGE HAS BEEN CONFIRMED, SUGGEST FOLLOW UP TESTING OF ANTITHROMBIN III LEVELS.   Glucose, capillary     Status: Abnormal   Collection Time: 04/19/15 11:43 AM  Result Value Ref Range   Glucose-Capillary 260 (H) 65 - 99 mg/dL    US Renal  04/18/2015  CLINICAL DATA:  Acute renal failure EXAM: RENAL / URINARY TRACT ULTRASOUND COMPLETE  COMPARISON:  None FINDINGS: Right Kidney: Length: 12.1 cm. Echogenicity within normal limits. No mass or hydronephrosis visualized. Left Kidney: Length: 12.3 cm. Normal parenchymal echogenicity. No obstructive uropathy. There is a cyst within the inferior pole which measure 3.4 x 3.8 x 3.4 cm. Bladder: Appears normal for degree of bladder distention. IMPRESSION: 1. No acute findings.  No hydronephrosis 2. Left renal cyst. Electronically Signed   By: Kerby Moors M.D.   On: 04/18/2015 11:57   Dg Chest Port 1 View  04/19/2015  CLINICAL DATA:  Acute on chronic respiratory failure, healthcare associated pneumonia. EXAM: PORTABLE CHEST 1 VIEW COMPARISON:  Portable chest x-ray of May 18, 2015 FINDINGS: The right lung is well-expanded. Mildly increased interstitial prominence has developed in the midportion of the right lung. On the left there is persistent confluent alveolar opacity in the upper and lower lobes. The hemidiaphragm remains obscured. There is no mediastinal shift. The cardiac silhouette is top-normal in size. There are post CABG changes. There are multiple broken upper sternal wires. The Port-A-Cath appliance tip projects over the midportion of the SVC. IMPRESSION: Stable appearance of the left lung consistent with postobstructive atelectasis and pneumonia. On the right slightly increased interstitial density in the mid lung may reflect developing interstitial pneumonia or edema. Electronically Signed   By: David  Martinique M.D.   On: 04/19/2015 07:23   Dg Chest Port 1 View  04/18/2015  CLINICAL DATA:  Followup postobstructive atelectasis and pneumonia involving the left lung. Current history of left upper lobe lung cancer. EXAM: PORTABLE CHEST 1 VIEW COMPARISON:  04/17/2015 and earlier, including CT chest 01/05/2015. FINDINGS: Sternotomy for CABG. Cardiac silhouette normal in size. Consolidation throughout the left lung with mediastinal shift to the left, unchanged since yesterday. Right lung  emphysematous though clear. Right jugular Port-A-Cath tip projects over the lower SVC. IMPRESSION: 1. Stable likely postobstructive atelectasis and pneumonia throughout the entire left lung. 2. Right lung remains clear.  No new abnormalities. Electronically Signed   By: Evangeline Dakin M.D.   On: 04/18/2015 07:59    Review of Systems  Constitutional: Positive for fever, chills, malaise/fatigue and diaphoresis.  HENT: Positive for congestion and sore throat.   Eyes: Negative.   Respiratory: Positive for cough, sputum production and shortness of breath.   Cardiovascular: Positive for chest pain, palpitations, leg swelling and PND.  Gastrointestinal: Negative.   Genitourinary: Negative.   Musculoskeletal: Positive for myalgias.  Skin: Negative.  Neurological: Positive for weakness.  Endo/Heme/Allergies: Negative.   Psychiatric/Behavioral: Negative.    Blood pressure 134/76, pulse 126, temperature 97.7 F (36.5 C), temperature source Axillary, resp. rate 18, height 6' (1.829 m), weight 99.8 kg (220 lb 0.3 oz), SpO2 98 %. Physical Exam  Nursing note and vitals reviewed. Constitutional: He is oriented to person, place, and time. He appears well-developed and well-nourished.  HENT:  Head: Normocephalic and atraumatic.  Eyes: Conjunctivae and EOM are normal. Pupils are equal, round, and reactive to light.  Neck: Normal range of motion. Neck supple.  Cardiovascular: S1 normal, S2 normal, intact distal pulses and normal pulses.  An irregularly irregular rhythm present. Frequent extrasystoles are present. Tachycardia present.  Exam reveals distant heart sounds.   Murmur heard.  Systolic murmur is present with a grade of 2/6  Respiratory: He is in respiratory distress. He has decreased breath sounds in the left upper field, the left middle field and the left lower field. He has rhonchi in the right middle field and the right lower field.  GI: Soft. Bowel sounds are normal.  Musculoskeletal:  Normal range of motion.  Neurological: He is alert and oriented to person, place, and time. He has normal reflexes.  Skin: Skin is warm.  Psychiatric: He has a normal mood and affect.    Assessment/Plan: Atrial fibrillation rapid ventricular response Respiratory failure Hypoxemia Healthcare acquired pneumonia Pneumonitis Postobstructive pneumonia COPD Hypertension Lung cancer Diabetes Hyperlipidemia Coronary artery disease . PLAN Agree with ICU level care Continue amiodarone for rhythm control Agree with anticoagulation with heparin consider switching to Eliquis her Coumadin Continue supplemental inhalers Agree with oxygen therapy for hypoxemia Rate control with metoprolol consider adding digoxin Insulin therapy for diabetes management Protonix therapy for reflux symptoms Continue simvastatin therapy for lipid management Broad-spectrum antibiotic therapy for pneumonia Pulmonary input for respiratory failure and pneumonia Agree with oncology input for lung cancer status post chemotherapy Agree with echocardiogram for assessment of LV function and valvular structures with atrial fibrillation and shortness of breath Do not recommend invasive cardiac catheter at this stage No clear evidence of myocardial infarction with a history of coronary disease and coronary bypass surgery  Shaka Cardin D. 04/19/2015, 2:03 PM

## 2015-04-19 NOTE — Clinical Documentation Improvement (Signed)
Internal Medicine   Abnormal Lab/Test Results: Lactic Acid 6.44  Possible Clinical Conditions associated with below indicators   Lactic acidosis  Other Condition  Cannot Clinically Determine   Supporting Information: Pneumonia, Bacteremia  Treatment Provided: NS @ 75 ml/hr   Please exercise your independent, professional judgment when responding. A specific answer is not anticipated or expected.   Thank You,  [place] [phone]

## 2015-04-19 NOTE — Clinical Documentation Improvement (Signed)
Internal Medicine  Can the diagnosis of Bacteremia be further specified? Thank you   Sepsis  Sirs  Other  Clinically Undetermined  Document any associated diagnoses/conditions. Pneumonia   Supporting Information: Elev Lactic Acidosis, hypotension, Elev pulse/respirations   Please exercise your independent, professional judgment when responding. A specific answer is not anticipated or expected.   Thank You,  [place] [phone]

## 2015-04-19 NOTE — Progress Notes (Signed)
Better day. More alert and interactive.Requested his own PT consult.

## 2015-04-19 NOTE — Progress Notes (Signed)
Arrived to find bipap alarming continously.  Mask with large air leak.  Spouse states patient has not rested well all night. He keeps pulling at mask.  Patient wanted a break from mask. Placed patient back on HFNC. Reported findings to rn

## 2015-04-19 NOTE — Progress Notes (Signed)
Patient has rested on and off throughout the night, remains alert and oriented x4.  Patient requested to remove BiPAP part way through the night, sats remain >90% on the Hi-Flow [place].  Patient is in [place] per monitor but has gone between AFib and sinus tach for most of the night.  All other vitals remain stable.  Report given to day shift RN.

## 2015-04-19 NOTE — Progress Notes (Signed)
[facility]  ROUNDING NOTE   Subjective:  Creatinine down to 2.05. Urine output good at 2.3 L over the preceding 24 hours. Patient on high flow nasal cannula.   Objective:  Vital signs in last 24 hours:  Temp:  [97.6 F (36.4 C)-98.6 F (37 C)] 97.7 F (36.5 C) (04/10 0800) Pulse Rate:  [115-133] 133 (04/10 0900) Resp:  [18-32] 32 (04/10 0900) BP: (98-165)/(55-90) 143/72 mmHg (04/10 0900) SpO2:  [94 %-100 %] 96 % (04/10 0900) FiO2 (%):  [40 %-43 %] 43 % (04/10 0839)  Weight change:  Filed Weights   04/17/15 1141 04/17/15 1419  Weight: 97.977 kg (216 lb) 99.8 kg (220 lb 0.3 oz)    Intake/Output: I/O last 3 completed shifts: In: 6086.2 [P.O.:600; I.V.:5186.2; IV Piggyback:300] Out: 4400 [Urine:4400]   Intake/Output this shift:     Physical Exam: General: Critically ill appearing  Head: Normocephalic, atraumatic. Moist oral mucosal membranes, high flow [place]  Eyes: Anicteric  Neck: Supple, trachea midline  Lungs:  Scattered rhonchi, normal effort  Heart: S1S2 irregular  Abdomen:  Soft, nontender, BS present   Extremities: no peripheral edema.  Neurologic: Nonfocal, moving all four extremities  Skin: No lesions       Basic Metabolic Panel:  Recent Labs Lab 04/14/15 0836 04/14/15 0857 04/17/15 1205 04/17/15 1929 04/18/15 0504 04/19/15 0539  NA 131*  --  130* 131* 130* 128*  K 4.0  --  4.7 4.9 4.8 3.3*  CL 105  --  102 105 106 99*  CO2 19*  --  15* 13* 14* 13*  GLUCOSE 143*  --  112* 141* 254* 696*  BUN 21*  --  55* 55* 60* 50*  CREATININE 1.27*  --  3.59* 3.46* 3.11* 2.05*  CALCIUM 8.2*  --  8.3* 7.9* 7.7* 6.7*  MG  --  1.9  --  1.7 1.9 2.0  PHOS  --   --   --   --  4.2  --     Liver Function Tests:  Recent Labs Lab 04/14/15 0836  AST 29  ALT 21  ALKPHOS 31*  BILITOT 0.5  PROT 7.2  ALBUMIN 3.5   No results for input(s): LIPASE, AMYLASE in the last 168 hours. No results for input(s): AMMONIA in the last 168  hours.  CBC:  Recent Labs Lab 04/14/15 0836 04/17/15 1205 04/18/15 0504 04/19/15 0539  WBC 5.8 8.4 7.3 10.8*  NEUTROABS 3.9 7.5*  --   --   HGB 10.8* 10.0* 8.7* 8.5*  HCT 32.8* 30.3* 26.4* 25.4*  MCV 84.1 83.9 83.1 84.5  PLT 162 154 122* 136*    Cardiac Enzymes:  Recent Labs Lab 04/17/15 1205 04/17/15 1929 04/18/15 0112 04/18/15 0504 04/18/15 1833  CKTOTAL  --   --   --   --  68  CKMB  --   --   --   --  5.8*  TROPONINI <0.03 0.06* <0.03 <0.03 <0.03    BNP: Invalid input(s): POCBNP  CBG:  Recent Labs Lab 04/18/15 1058 04/18/15 1609 04/18/15 2100 04/19/15 0642 04/19/15 0747  GLUCAP 247* 214* 251* 245* 263*    Microbiology: Results for orders placed or performed during the hospital encounter of 04/17/15  Blood Culture (routine x 2)     Status: Abnormal (Preliminary result)   Collection Time: 04/17/15 12:05 PM  Result Value Ref Range Status   Specimen Description BLOOD RIGHT CHEST  Final   Special Requests BOTTLES DRAWN AEROBIC AND ANAEROBIC  3CC  Final  Culture  Setup Time   Final    GRAM NEGATIVE RODS ANAEROBIC BOTTLE ONLY CRITICAL RESULT CALLED TO, READ BACK BY AND VERIFIED WITH: Sheema Hallaji @ 9024 04/18/15 by [facility]    Culture (A)  Final    HAEMOPHILUS INFLUENZAE ANAEROBIC BOTTLE ONLY BETA LACTAMASE NEGATIVE Referred to [facility] in [place], [place] for Serotyping.    Report Status PENDING  Incomplete  Blood Culture ID Panel (Reflexed)     Status: Abnormal   Collection Time: 04/17/15 12:05 PM  Result Value Ref Range Status   Enterococcus species NOT DETECTED NOT DETECTED Final   Vancomycin resistance NOT DETECTED NOT DETECTED Final   Listeria monocytogenes NOT DETECTED NOT DETECTED Final   Staphylococcus species NOT DETECTED NOT DETECTED Final   Staphylococcus aureus NOT DETECTED NOT DETECTED Final   Methicillin resistance NOT DETECTED NOT DETECTED Final   Streptococcus species NOT DETECTED NOT DETECTED Final    Streptococcus agalactiae NOT DETECTED NOT DETECTED Final   Streptococcus pneumoniae NOT DETECTED NOT DETECTED Final   Streptococcus pyogenes NOT DETECTED NOT DETECTED Final   Acinetobacter baumannii NOT DETECTED NOT DETECTED Final   Enterobacteriaceae species NOT DETECTED NOT DETECTED Final   Enterobacter cloacae complex NOT DETECTED NOT DETECTED Final   Escherichia coli NOT DETECTED NOT DETECTED Final   Klebsiella oxytoca NOT DETECTED NOT DETECTED Final   Klebsiella pneumoniae NOT DETECTED NOT DETECTED Final   Proteus species NOT DETECTED NOT DETECTED Final   Serratia marcescens NOT DETECTED NOT DETECTED Final   Carbapenem resistance NOT DETECTED NOT DETECTED Final   Haemophilus influenzae DETECTED (A) NOT DETECTED Final    Comment: CRITICAL RESULT CALLED TO, READ BACK BY AND VERIFIED WITH: Sheema Hallaji @ 0973 04/18/15 by [place]    Neisseria meningitidis NOT DETECTED NOT DETECTED Final   Pseudomonas aeruginosa NOT DETECTED NOT DETECTED Final   Candida albicans NOT DETECTED NOT DETECTED Final   Candida glabrata NOT DETECTED NOT DETECTED Final   Candida krusei NOT DETECTED NOT DETECTED Final   Candida parapsilosis NOT DETECTED NOT DETECTED Final   Candida tropicalis NOT DETECTED NOT DETECTED Final  Blood Culture (routine x 2)     Status: None (Preliminary result)   Collection Time: 04/17/15  1:54 PM  Result Value Ref Range Status   Specimen Description BLOOD LEFT HAND  Final   Special Requests BOTTLES DRAWN AEROBIC AND ANAEROBIC  1CC  Final   Culture NO GROWTH 2 DAYS  Final   Report Status PENDING  Incomplete  MRSA PCR Screening     Status: None   Collection Time: 04/17/15  2:19 PM  Result Value Ref Range Status   MRSA by PCR NEGATIVE NEGATIVE Final    Comment:        The GeneXpert MRSA Assay (FDA approved for NASAL specimens only), is one component of a comprehensive MRSA colonization surveillance program. It is not intended to diagnose MRSA infection nor to guide or monitor  treatment for MRSA infections.   Urine culture     Status: None (Preliminary result)   Collection Time: 04/17/15  7:29 PM  Result Value Ref Range Status   Specimen Description URINE, RANDOM  Final   Special Requests NONE  Final   Culture NO GROWTH < 24 HOURS  Final   Report Status PENDING  Incomplete    Coagulation Studies:  Recent Labs  04/18/15 0939  LABPROT 16.9*  INR 1.36    Urinalysis:  Recent Labs  04/17/15 [place]  1.012  PHURINE 5.0  GLUCOSEU NEGATIVE  HGBUR 2+*  BILIRUBINUR NEGATIVE  KETONESUR NEGATIVE  PROTEINUR 30*  NITRITE NEGATIVE  LEUKOCYTESUR NEGATIVE      Imaging: Dg Chest 1 View  04/17/2015  CLINICAL DATA:  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 EXAM: Portable CHEST 1 VIEW COMPARISON:  03/10/2015 and earlier, including CT chest 01/05/2015 and earlier and PET-CT 09/10/2014. FINDINGS: Since the chest x-ray 5 weeks ago, development of confluent airspace opacities through out the left lung, associated with volume loss and mediastinal shift to the left. Suboptimal inspiration accounts for mild right basilar atelectasis. Right lung otherwise clear. Prior sternotomy for CABG. Cardiac silhouette normal in size, unchanged. Right jugular Port-A-Cath tip projects over the lower SVC, unchanged. IMPRESSION: 1. Likely postobstructive atelectasis and pneumonia involving the entire left lung, new since a chest x-ray 5 weeks ago. 2. Suboptimal inspiration accounts for mild right basilar atelectasis. Right lung otherwise clear. Electronically Signed   By: Evangeline Dakin M.D.   On: 04/17/2015 12:30   US Renal  04/18/2015  CLINICAL DATA:  Acute renal failure EXAM: RENAL / URINARY TRACT ULTRASOUND COMPLETE COMPARISON:  None FINDINGS: Right  Kidney: Length: 12.1 cm. Echogenicity within normal limits. No mass or hydronephrosis visualized. Left Kidney: Length: 12.3 cm. Normal parenchymal echogenicity. No obstructive uropathy. There is a cyst within the inferior pole which measure 3.4 x 3.8 x 3.4 cm. Bladder: Appears normal for degree of bladder distention. IMPRESSION: 1. No acute findings.  No hydronephrosis 2. Left renal cyst. Electronically Signed   By: Kerby Moors M.D.   On: 04/18/2015 11:57   Dg Chest Port 1 View  04/19/2015  CLINICAL DATA:  Acute on chronic respiratory failure, healthcare associated pneumonia. EXAM: PORTABLE CHEST 1 VIEW COMPARISON:  Portable chest x-ray of May 18, 2015 FINDINGS: The right lung is well-expanded. Mildly increased interstitial prominence has developed in the midportion of the right lung. On the left there is persistent confluent alveolar opacity in the upper and lower lobes. The hemidiaphragm remains obscured. There is no mediastinal shift. The cardiac silhouette is top-normal in size. There are post CABG changes. There are multiple broken upper sternal wires. The Port-A-Cath appliance tip projects over the midportion of the SVC. IMPRESSION: Stable appearance of the left lung consistent with postobstructive atelectasis and pneumonia. On the right slightly increased interstitial density in the mid lung may reflect developing interstitial pneumonia or edema. Electronically Signed   By: David  Martinique M.D.   On: 04/19/2015 07:23   Dg Chest Port 1 View  04/18/2015  CLINICAL DATA:  Followup postobstructive atelectasis and pneumonia involving the left lung. Current history of left upper lobe lung cancer. EXAM: PORTABLE CHEST 1 VIEW COMPARISON:  04/17/2015 and earlier, including CT chest 01/05/2015. FINDINGS: Sternotomy for CABG. Cardiac silhouette normal in size. Consolidation throughout the left lung with mediastinal shift to the left, unchanged since yesterday. Right lung emphysematous though clear. Right jugular  Port-A-Cath tip projects over the lower SVC. IMPRESSION: 1. Stable likely postobstructive atelectasis and pneumonia throughout the entire left lung. 2. Right lung remains clear.  No new abnormalities. Electronically Signed   By: Evangeline Dakin M.D.   On: 04/18/2015 07:59     Medications:   . sodium chloride 75 mL/hr at 04/19/15 0928  . amiodarone 30 mg/hr (04/18/15 2000)  .  heparin 1,100 Units/hr (04/19/15 0236)   . ampicillin-sulbactam (UNASYN) IV  3 g Intravenous Q6H  . antiseptic oral rinse  7 mL Mouth Rinse q12n4p  . aspirin EC  81 mg Oral Daily  . benzonatate  200 mg Oral TID  . chlorhexidine  15 mL Mouth Rinse BID  . guaiFENesin  600 mg Oral BID  . insulin aspart  0-20 Units Subcutaneous TID WC  . insulin aspart  0-5 Units Subcutaneous QHS  . levalbuterol  1.25 mg Nebulization 4 times per day  . magnesium oxide  400 mg Oral Daily  . mometasone-formoterol  2 puff Inhalation BID  . multivitamin-lutein  1 capsule Oral Daily  . nystatin  5 mL Oral QID  . pantoprazole  40 mg Oral Daily  . simvastatin  40 mg Oral QHS  . sodium chloride flush  3 mL Intravenous Q12H   acetaminophen **OR** acetaminophen, chlorpheniramine-HYDROcodone, guaiFENesin-codeine, morphine injection, ondansetron **OR** ondansetron (ZOFRAN) IV, oxyCODONE, sodium chloride flush, traMADol  Assessment/ Plan:  75 y.o. 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 Initial workup showed left-sided pneumonitis.  1. Acute renal failure/chronic kidney disease stage III Baseline creatinine 1.2 with EGFR 54. Acute renal failure most likely related to diarrhea, poor by mouth intake, and possibly Naprosyn.  - Renal function has improved but  creatinine not yet back to baseline. Continue IV fluid hydration. Switch IV fluids to sodium bicarbonate as serum bicarbonate is down to 13.  2. Acute respiratory failure secondary to bacterial pneumonia. Haemophilus influenza growing on blood cultures. Continue antibiotic therapy as well as high flow nasal cannula.  3. Hyponatremia. Sodium low at 128. 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 Continue to monitor.  4. Metabolic acidosis. Serum bicarbonate quite low at 13. Discontinue normal saline and switch over to sodium bicarbonate drip.   LOS: 2 Efton Thomley 4/10/201710:20 AM

## 2015-04-19 NOTE — Progress Notes (Signed)
PT PROFILE: 23 M with Stage IIIa Sq cell ca of lung, recent chemotherapy, acute on chronic respiratory failure due to  H flu PNA and bacteremia. Hospitalization c/b atrial flutter, AKI. Cards and nephrology following  SUBJ: Remains dyspneic @ rest. Continues to cough vigorously and frequently. Cognition intact. No new complaints  OBJ: Filed Vitals:   04/19/15 0800 04/19/15 0839 04/19/15 0900 04/19/15 1124  BP: 163/85  143/72   Pulse: 127  133   Temp: 97.7 F (36.5 C)     TempSrc:      Resp: 22  32   Height:      Weight:      SpO2: 98% 98% 96% 96%   Dyspneic @ rest HEENT WNL JVP not well visualized L sided wheeze and crackles throughout, minimal R basilar crackles Tachy, reg, no M noted NABS, soft, NT Ext warm, no edema  BMP Latest Ref Rng 04/19/2015 04/18/2015 04/17/2015  Glucose 65 - 99 mg/dL 696(HH) 254(H) 141(H)  BUN 6 - 20 mg/dL 50(H) 60(H) 55(H)  Creatinine 0.61 - 1.24 mg/dL 2.05(H) 3.11(H) 3.46(H)  Sodium 135 - 145 mmol/L 128(L) 130(L) 131(L)  Potassium 3.5 - 5.1 mmol/L 3.3(L) 4.8 4.9  Chloride 101 - 111 mmol/L 99(L) 106 105  CO2 22 - 32 mmol/L 13(L) 14(L) 13(L)  Calcium 8.9 - 10.3 mg/dL 6.7(L) 7.7(L) 7.9(L)    CBC Latest Ref Rng 04/19/2015 04/18/2015 04/17/2015  WBC 3.8 - 10.6 K/uL 10.8(H) 7.3 8.4  Hemoglobin 13.0 - 18.0 g/dL 8.5(L) 8.7(L) 10.0(L)  Hematocrit 40.0 - 52.0 % 25.4(L) 26.4(L) 30.3(L)  Platelets 150 - 440 K/uL 136(L) 122(L) 154   MICRO: 04/08 blood >> 1/2 positive for  Anti-infectives    Start     Dose/Rate Route Frequency Ordered Stop   04/19/15 1000  Ampicillin-Sulbactam (UNASYN) 3 g in sodium chloride 0.9 % 100 mL IVPB     3 g 100 mL/hr over 60 Minutes Intravenous Every 6 hours 04/19/15 0943     04/18/15 1400  Ampicillin-Sulbactam (UNASYN) 3 g in sodium chloride 0.9 % 100 mL IVPB  Status:  Discontinued     3 g 100 mL/hr over 60 Minutes Intravenous Every 12 hours 04/18/15 0808 04/19/15 0943   04/17/15 2200  piperacillin-tazobactam (ZOSYN) IVPB 3.375 g   Status:  Discontinued     3.375 g 12.5 mL/hr over 240 Minutes Intravenous 3 times per day 04/17/15 1443 04/18/15 0805   04/17/15 2100  vancomycin (VANCOCIN) IVPB 1000 mg/200 mL premix     1,000 mg 200 mL/hr over 60 Minutes Intravenous  Once 04/17/15 1450 04/17/15 2125   04/17/15 1800  piperacillin-tazobactam (ZOSYN) IVPB 3.375 g  Status:  Discontinued     3.375 g 12.5 mL/hr over 240 Minutes Intravenous 3 times per day 04/17/15 1441 04/17/15 1443   04/17/15 1245  aztreonam (AZACTAM) 2 g in dextrose 5 % 50 mL IVPB     2 g 100 mL/hr over 30 Minutes Intravenous  Once 04/17/15 1232 04/17/15 1329   04/17/15 1245  vancomycin (VANCOCIN) IVPB 1000 mg/200 mL premix     1,000 mg 200 mL/hr over 60 Minutes Intravenous  Once 04/17/15 1232 04/17/15 1430      CXR: extensive infiltrate throughout on L, mild interstitial prominence on R  IMPRESSION: Acute on chronic hypoxic respiratory failure Squamous cell carcinoma of lung COPD L sided H flu PNA Atrial flutter 2:1 block AKI, nonoliguric Hyponatremia Hypokalemia DM 2, poorly controlled Chronic anemia  PLAN/REC: Cont high flow O2  -SpO2 goal > 88%  Cont abx as above Begin nebulized steroids Scheduled nebulized bronchodilators - levalbuterol due to tachycardia Change amiodarone to PO - continue load Low dose PRN metoprolol to maintain HR < 115/min Monitor BMET intermittently Monitor I/Os Correct electrolytes as indicated Renal Service following - HCO 3 gtt initiated 04/10 DC systemic steroids Begin Lantus 04/10 Cont SSI  Needs to remain in ICU until respiratory status stbilizes  [name], MD PCCM service Mobile [phone] Pager [phone] 04/19/2015

## 2015-04-19 NOTE — Progress Notes (Signed)
Inpatient Diabetes Program Recommendations  AACE/ADA: New Consensus Statement on Inpatient Glycemic Control (2015)  Target Ranges:  Prepandial:   less than 140 mg/dL      Peak postprandial:   less than 180 mg/dL (1-2 hours)      Critically ill patients:  140 - 180 mg/dL  Results for RIVEN, MABILE (MRN [id number]) as of 04/19/2015 08:56  Ref. Range 04/18/2015 07:02 04/18/2015 10:58 04/18/2015 16:09 04/18/2015 21:00 04/19/2015 06:42 04/19/2015 07:47  Glucose-Capillary Latest Ref Range: 65-99 mg/dL 277 (H) 247 (H) 214 (H) 251 (H) 245 (H) 263 (H)   Review of Glycemic Control   Current orders for Inpatient glycemic control: Novolog 0-20 units TID with meals, Novolog 0-5 units QHs  Inpatient Diabetes Program Recommendations: Insulin - Basal: Please consider ordering low dose basal insulin. Recommend starting with Levemir 10 units Q24H (Based on 99 kg x 0.1 units).  Thanks, Barnie Alderman, RN, MSN, CDE Diabetes Coordinator Inpatient Diabetes Program [phone] (Team Pager from [place] to [place]) [phone] ([place] office) [phone] [facility] office) [phone] [facility] office)

## 2015-04-19 NOTE — Progress Notes (Addendum)
[facility] [place] NAME: Ivan Beasley    MR#:  [id number]  DATE OF BIRTH:  09-07-1940  SUBJECTIVE:  CHIEF COMPLAINT:   Chief Complaint  Patient presents with  . Shortness of Breath   Off BIPAP, on HF O2, still SOB, wheezing and cough. HR 130, on amiodarone drip. REVIEW OF SYSTEMS:  CONSTITUTIONAL: No fever, has weakness.  EYES: No blurred or double vision.  EARS, NOSE, AND THROAT: No tinnitus or ear pain.  RESPIRATORY: has cough, shortness of breath and wheezing, no  hemoptysis.  CARDIOVASCULAR: No chest pain, orthopnea, edema.  GASTROINTESTINAL: No nausea, vomiting, or abdominal pain. But has diarrhea. GENITOURINARY: No dysuria, hematuria.  ENDOCRINE: No polyuria, nocturia,  HEMATOLOGY: No anemia, easy bruising or bleeding SKIN: No rash or lesion. MUSCULOSKELETAL: No joint pain or arthritis.   NEUROLOGIC: No tingling, numbness, weakness.  PSYCHIATRY: No anxiety or depression.   DRUG ALLERGIES:  No Known Allergies  VITALS:  Blood pressure 165/80, pulse 128, temperature 97.8 F (36.6 C), temperature source Axillary, resp. rate 20, height 6' (1.829 m), weight 99.8 kg (220 lb 0.3 oz), SpO2 97 %.  PHYSICAL EXAMINATION:  GENERAL:  75 y.o.-year-old patient lying in the bed with no acute distress.  EYES: Pupils equal, round, reactive to light and accommodation. No scleral icterus. Extraocular muscles intact.  HEENT: Head atraumatic, normocephalic. Oropharynx and nasopharynx clear.  NECK:  Supple, no jugular venous distention. No thyroid enlargement, no tenderness.  LUNGS: Normal breath sounds on right side but decreased BS, wheezing, rhonchi and crackles on left side. No use of accessory muscles of respiration.  CARDIOVASCULAR: S1, S2 normal. No murmurs, rubs, or gallops.  ABDOMEN: Soft, nontender, nondistended. Bowel sounds present. No organomegaly or mass.  EXTREMITIES: No pedal edema, cyanosis, or clubbing.  NEUROLOGIC:  Cranial nerves II through XII are intact. Muscle strength 4/5 in all extremities. Sensation intact. Gait not checked.  PSYCHIATRIC: The patient is alert and oriented x 3.  SKIN: No obvious rash, lesion, or ulcer.    LABORATORY PANEL:   CBC  Recent Labs Lab 04/19/15 0539  WBC 10.8*  HGB 8.5*  HCT 25.4*  PLT 136*   ------------------------------------------------------------------------------------------------------------------  Chemistries   Recent Labs Lab 04/14/15 0836  04/19/15 0539  NA 131*  < > 128*  K 4.0  < > 3.3*  CL 105  < > 99*  CO2 19*  < > 13*  GLUCOSE 143*  < > 696*  BUN 21*  < > 50*  CREATININE 1.27*  < > 2.05*  CALCIUM 8.2*  < > 6.7*  MG  --   < > 2.0  AST 29  --   --   ALT 21  --   --   ALKPHOS 31*  --   --   BILITOT 0.5  --   --   < > = values in this interval not displayed. 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 EXAM: Portable CHEST 1 VIEW COMPARISON:  03/10/2015 and earlier, including CT chest 01/05/2015 and earlier and PET-CT 09/10/2014. FINDINGS: Since the chest x-ray 5 weeks ago, development of confluent airspace  opacities through out the left lung, associated with volume loss and mediastinal shift to the left. Suboptimal inspiration accounts for mild right basilar atelectasis. Right lung otherwise clear. Prior sternotomy for CABG. Cardiac silhouette normal in size, unchanged. Right jugular  Port-A-Cath tip projects over the lower SVC, unchanged. IMPRESSION: 1. Likely postobstructive atelectasis and pneumonia involving the entire left lung, new since a chest x-ray 5 weeks ago. 2. Suboptimal inspiration accounts for mild right basilar atelectasis. Right lung otherwise clear. Electronically Signed   By: Evangeline Dakin M.D.   On: 04/17/2015 12:30   US Renal  04/18/2015  CLINICAL DATA:  Acute renal failure EXAM: RENAL / URINARY TRACT ULTRASOUND COMPLETE COMPARISON:  None FINDINGS: Right Kidney: Length: 12.1 cm. Echogenicity within normal limits. No mass or hydronephrosis visualized. Left Kidney: Length: 12.3 cm. Normal parenchymal echogenicity. No obstructive uropathy. There is a cyst within the inferior pole which measure 3.4 x 3.8 x 3.4 cm. Bladder: Appears normal for degree of bladder distention. IMPRESSION: 1. No acute findings.  No hydronephrosis 2. Left renal cyst. Electronically Signed   By: Kerby Moors M.D.   On: 04/18/2015 11:57   Dg Chest Port 1 View  04/19/2015  CLINICAL DATA:  Acute on chronic respiratory failure, healthcare associated pneumonia. EXAM: PORTABLE CHEST 1 VIEW COMPARISON:  Portable chest x-ray of May 18, 2015 FINDINGS: The right lung is well-expanded. Mildly increased interstitial prominence has developed in the midportion of the right lung. On the left there is persistent confluent alveolar opacity in the upper and lower lobes. The hemidiaphragm remains obscured. There is no mediastinal shift. The cardiac silhouette is top-normal in size. There are post CABG changes. There are multiple broken upper sternal wires. The Port-A-Cath appliance tip projects over the midportion of the SVC. IMPRESSION: Stable appearance of the left lung consistent with postobstructive atelectasis and pneumonia. On the right slightly increased interstitial density in the mid lung may reflect developing interstitial pneumonia or edema. Electronically Signed   By: David  Martinique M.D.   On: 04/19/2015  07:23   Dg Chest Port 1 View  04/18/2015  CLINICAL DATA:  Followup postobstructive atelectasis and pneumonia involving the left lung. Current history of left upper lobe lung cancer. EXAM: PORTABLE CHEST 1 VIEW COMPARISON:  04/17/2015 and earlier, including CT chest 01/05/2015. FINDINGS: Sternotomy for CABG. Cardiac silhouette normal in size. Consolidation throughout the left lung with mediastinal shift to the left, unchanged since yesterday. Right lung emphysematous though clear. Right jugular Port-A-Cath tip projects over the lower SVC. IMPRESSION: 1. Stable likely postobstructive atelectasis and pneumonia throughout the entire left lung. 2. Right lung remains clear.  No new abnormalities. Electronically Signed   By: Evangeline Dakin M.D.   On: 04/18/2015 07:59    EKG:   Orders placed or performed during the hospital encounter of 04/17/15  . EKG 12-Lead  . EKG 12-Lead  . EKG 12-Lead  . EKG 12-Lead    ASSESSMENT AND PLAN:    75 year old Caucasian gentleman known stage III squamous cell lung carcinoma on Nivolumab with Dr. Jeb Levering presenting in respiratory distress.  1. Acute on chronic respiratory failure with hypoxia:  Weaned off BiPAP to a high flow nasal cannula since last night. Change to xopenex q6h, continue dulera.  * left pneumonitis reaction secondary to nivolumab. Treated with IV steroids 10 mg Decadron every 8 hours for 24 hours.  Blood culture: Haemophilus influenzae. Repeat chest x-ray shows Stable likely postobstructive atelectasis and pneumonia throughout the entire left lung. No change. Continue vancomycin and  unasyn.  2. Type 2 diabetes non-insulin-requiring.  hold oral agents and continue insulin sliding.  3. Acute renal failure: improving. decrease  IV fluid rate, f/u BMP and check renal ultrasound: no obstruction.  4. Stage III squamous cell carcinoma lung. F/u oncologist.   * Afib with RVR, treated with cardizem iv one dose, but hypotensive, put on amiodarone  drip. continue amiodarone drip. Continue ASA and heparin drip.  F/u cardiology consult.  F/u Echo.   * Hyponatremia. Improving, Continue NS iv, f/u BMP. * Hypokalemia. KCl, f/u BMP.   I discussed with Dr. Leonidas Romberg.  All the records are reviewed and case discussed with Care Management/Social Workerr. Management plans discussed with the patient, his wife and they are in agreement.  CODE STATUS: full code.  TOTAL CRITICAL TIME TAKING CARE OF THIS PATIENT: 42 minutes.  Greater than 50% time was spent on coordination of care and face-to-face counseling.  POSSIBLE D/C IN >3 DAYS, DEPENDING ON CLINICAL CONDITION.   Demetrios Loll M.D on 04/19/2015 at 8:02 AM  Between 7am to 6pm - Pager - [phone]  After 6pm go to [email or web address] - password [email or web address]  Office  [phone]  CC: Primary care physician; Albina Billet, MD

## 2015-04-19 NOTE — Progress Notes (Addendum)
[place] for Heparin drip dosing and monitoring   Indication: atrial fibrillation  No Known Allergies  Patient Measurements: Height: 6' (182.9 cm) Weight: 220 lb 0.3 oz (99.8 kg) IBW/kg (Calculated) : 77.6  Vital Signs: Temp: 98.6 F (37 C) (04/10 1500) BP: 143/88 mmHg (04/10 1800) Pulse Rate: 126 (04/10 1800)   Recent Labs  04/17/15 1205 04/17/15 1929 04/18/15 0112 04/18/15 0504 04/18/15 0939 04/18/15 1833 04/19/15 0539 04/19/15 1014 04/19/15 1904  HGB 10.0*  --   --  8.7*  --   --  8.5*  --   --   HCT 30.3*  --   --  26.4*  --   --  25.4*  --   --   PLT 154  --   --  122*  --   --  136*  --   --   APTT  --   --   --   --  39*  --   --   --   --   LABPROT  --   --   --   --  16.9*  --   --   --   --   INR  --   --   --   --  1.36  --   --   --   --   HEPARINUNFRC  --   --   --   --   --  0.20*  --  0.13* 0.21*  CREATININE 3.59* 3.46*  --  3.11*  --   --  2.05*  --   --   CKTOTAL  --   --   --   --   --  68  --   --   --   CKMB  --   --   --   --   --  5.8*  --   --   --   TROPONINI <0.03 0.06* <0.03 <0.03  --  <0.03  --   --   --     Estimated Creatinine Clearance: 38.1 mL/min (by C-G formula based on Cr of 2.05).   Medical History: Past Medical History  Diagnosis Date  . Coronary artery disease   . Pneumonia 2000  . Arthritis   . 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 Bruises easily   . GERD (gastroesophageal reflux disease)     takes Omeprazole daily as needed for stomach pain  . Blood transfusion 2001  . Diabetes mellitus     takes Metformin daily;  . Impaired hearing     bil hearing aide  . Macular degeneration     being watched for this but hasn't been "completely" diagnosed  . Myocardial infarction ([place]) 2001  . Cancer [facility])     Assessment: 75 yo old man with stage III squamous cell lung carcinoma and A. Fib. Pharmacy consulted for heparin dosing and monitoring. Patient currently receiving  heparin at 1100 units/hr.   Goal of Therapy:  Heparin level 0.3-0.7 units/ml Monitor platelets by anticoagulation protocol: Yes   Plan:  Will bolus heparin 3000 units x 1 and increase rate to 1400 units/hr.   Will obtain follow up anti-Xa at 1900.    Pharmacy will continue to monitor and adjust per consult.    4/10:  HL @ 19:00 = 0.21           Will order heparin 1500 units IV X 1 bolus and increase drip rate  to 1600 units/hr.          Will draw next HL 8 hrs after rate change on 4/11 @ 0400.   Currie Paris  04/19/2015,8:14 PM

## 2015-04-19 NOTE — Progress Notes (Signed)
[place] for Heparin drip dosing and monitoring   Indication: atrial fibrillation  No Known Allergies  Patient Measurements: Height: 6' (182.9 cm) Weight: 220 lb 0.3 oz (99.8 kg) IBW/kg (Calculated) : 77.6  Vital Signs: Temp: 97.7 F (36.5 C) (04/10 0800) Temp Source: Axillary (04/10 0400) BP: 143/72 mmHg (04/10 0900) Pulse Rate: 133 (04/10 0900)   Recent Labs  04/17/15 1205 04/17/15 1929 04/18/15 0112 04/18/15 0504 04/18/15 0939 04/18/15 1833 04/19/15 0539 04/19/15 1014  HGB 10.0*  --   --  8.7*  --   --  8.5*  --   HCT 30.3*  --   --  26.4*  --   --  25.4*  --   PLT 154  --   --  122*  --   --  136*  --   APTT  --   --   --   --  39*  --   --   --   LABPROT  --   --   --   --  16.9*  --   --   --   INR  --   --   --   --  1.36  --   --   --   HEPARINUNFRC  --   --   --   --   --  0.20*  --  0.13*  CREATININE 3.59* 3.46*  --  3.11*  --   --  2.05*  --   CKTOTAL  --   --   --   --   --  68  --   --   CKMB  --   --   --   --   --  5.8*  --   --   TROPONINI <0.03 0.06* <0.03 <0.03  --  <0.03  --   --     Estimated Creatinine Clearance: 38.1 mL/min (by C-G formula based on Cr of 2.05).   Medical History: Past Medical History  Diagnosis Date  . Coronary artery disease   . Pneumonia 2000  . Arthritis   . 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 Bruises easily   . GERD (gastroesophageal reflux disease)     takes Omeprazole daily as needed for stomach pain  . Blood transfusion 2001  . Diabetes mellitus     takes Metformin daily;  . Impaired hearing     bil hearing aide  . Macular degeneration     being watched for this but hasn't been "completely" diagnosed  . Myocardial infarction ([place]) 2001  . Cancer [facility])     Assessment: 75 yo old man with stage III squamous cell lung carcinoma and A. Fib. Pharmacy consulted for heparin dosing and monitoring. Patient currently receiving heparin at 1100 units/hr.   Goal  of Therapy:  Heparin level 0.3-0.7 units/ml Monitor platelets by anticoagulation protocol: Yes   Plan:  Will bolus heparin 3000 units x 1 and increase rate to 1400 units/hr.   Will obtain follow up anti-Xa at 1900.    Pharmacy will continue to monitor and adjust per consult.    Currie Paris  04/19/2015,11:27 AM

## 2015-04-20 LAB — BASIC METABOLIC PANEL
Anion gap: 8 (ref 5–15)
BUN: 47 mg/dL — AB (ref 6–20)
CHLORIDE: 105 mmol/L (ref 101–111)
CO2: 21 mmol/L — AB (ref 22–32)
CREATININE: 1.73 mg/dL — AB (ref 0.61–1.24)
Calcium: 8.2 mg/dL — ABNORMAL LOW (ref 8.9–10.3)
GFR calc Af Amer: 43 mL/min — ABNORMAL LOW (ref >60)
GFR calc non Af Amer: 37 mL/min — ABNORMAL LOW (ref >60)
Glucose, Bld: 189 mg/dL — ABNORMAL HIGH (ref 65–99)
Potassium: 4.3 mmol/L (ref 3.5–5.1)
Sodium: 134 mmol/L — ABNORMAL LOW (ref 135–145)

## 2015-04-20 LAB — GLUCOSE, CAPILLARY
GLUCOSE-CAPILLARY: 179 mg/dL — AB (ref 65–99)
Glucose-Capillary: 277 mg/dL — ABNORMAL HIGH (ref 65–99)
Glucose-Capillary: 146 mg/dL — ABNORMAL HIGH (ref 65–99)
Glucose-Capillary: 198 mg/dL — ABNORMAL HIGH (ref 65–99)

## 2015-04-20 LAB — CBC
HCT: 29.2 % — ABNORMAL LOW (ref 40.0–52.0)
Hemoglobin: 9.7 g/dL — ABNORMAL LOW (ref 13.0–18.0)
MCH: 27.2 pg (ref 26.0–34.0)
MCHC: 33.2 g/dL (ref 32.0–36.0)
MCV: 81.9 fL (ref 80.0–100.0)
PLATELETS: 178 10*3/uL (ref 150–440)
RBC: 3.57 MIL/uL — ABNORMAL LOW (ref 4.40–5.90)
RDW: 18 % — AB (ref 11.5–14.5)
WBC: 16 10*3/uL — ABNORMAL HIGH (ref 3.8–10.6)

## 2015-04-20 LAB — HEPARIN LEVEL (UNFRACTIONATED)
Heparin Unfractionated: 0.46 IU/mL (ref 0.30–0.70)
Heparin Unfractionated: 0.36 IU/mL (ref 0.30–0.70)

## 2015-04-20 MED ORDER — ATORVASTATIN CALCIUM 20 MG PO TABS
20.0000 mg | ORAL_TABLET | Freq: Every day | ORAL | Status: DC
  Administered 2015-04-20 – 2015-04-22 (×3): 20 mg via ORAL
  Filled 2015-04-20 (×3): qty 1

## 2015-04-20 MED ORDER — APIXABAN 5 MG PO TABS
5.0000 mg | ORAL_TABLET | Freq: Two times a day (BID) | ORAL | Status: DC
  Administered 2015-04-20 – 2015-04-23 (×7): 5 mg via ORAL
  Filled 2015-04-20 (×7): qty 1

## 2015-04-20 MED ORDER — AMOXICILLIN-POT CLAVULANATE 875-125 MG PO TABS
1.0000 | ORAL_TABLET | Freq: Two times a day (BID) | ORAL | Status: DC
  Administered 2015-04-20 – 2015-04-23 (×7): 1 via ORAL
  Filled 2015-04-20 (×8): qty 1

## 2015-04-20 MED ORDER — DILTIAZEM HCL ER 60 MG PO CP12
60.0000 mg | ORAL_CAPSULE | Freq: Two times a day (BID) | ORAL | Status: DC
  Administered 2015-04-20 – 2015-04-23 (×7): 60 mg via ORAL
  Filled 2015-04-20 (×10): qty 1

## 2015-04-20 NOTE — Progress Notes (Signed)
Pts wife stated that pt does not need the Bipap machine and that his breathing is better. Pts wife also stated that MD stated it was okay for pt to be off the Bipap. Will inform RN and continue to monitor pt. No distress noted at this time. O2 sat 88-89 increased pts oxygen from 3 liters to 3.5 liters. O2 sat increased to 92%

## 2015-04-20 NOTE — Progress Notes (Signed)
Patient has remained alert and oriented x4 throughout the night.  Vital signs have remained stable and O2 saturation has remained in the high 90's on 3L [place].  Patient has had no complaints of pain or shortness of breath during the night and rested well.  Report given to day shift RN.

## 2015-04-20 NOTE — Progress Notes (Signed)
Report from [facility]. Patient resting quietly with wife at bedside. No distress. Will continue to monitor.

## 2015-04-20 NOTE — Progress Notes (Signed)
[place] for Apixaban dosing   Indication: atrial fibrillation  No Known Allergies  Patient Measurements: Height: 6' (182.9 cm) Weight: 220 lb 0.3 oz (99.8 kg) IBW/kg (Calculated) : 77.6  Vital Signs: Temp: 98.6 F (37 C) (04/11 0800) Temp Source: Oral (04/11 0800) BP: 136/76 mmHg (04/11 1100) Pulse Rate: 124 (04/11 1100)   Recent Labs  04/18/15 0112  04/18/15 0504 04/18/15 0939 04/18/15 1833 04/19/15 0539  04/19/15 1904 04/20/15 0346 04/20/15 1155  HGB  --   < > 8.7*  --   --  8.5*  --   --  9.7*  --   HCT  --   --  26.4*  --   --  25.4*  --   --  29.2*  --   PLT  --   --  122*  --   --  136*  --   --  178  --   APTT  --   --   --  39*  --   --   --   --   --   --   LABPROT  --   --   --  16.9*  --   --   --   --   --   --   INR  --   --   --  1.36  --   --   --   --   --   --   HEPARINUNFRC  --   --   --   --  0.20*  --   < > 0.21* 0.46 0.36  CREATININE  --   --  3.11*  --   --  2.05*  --   --  1.73*  --   CKTOTAL  --   --   --   --  68  --   --   --   --   --   CKMB  --   --   --   --  5.8*  --   --   --   --   --   TROPONINI <0.03  --  <0.03  --  <0.03  --   --   --   --   --   < > = values in this interval not displayed.  Estimated Creatinine Clearance: 45.1 mL/min (by C-G formula based on Cr of 1.73).   Medical History: Past Medical History  Diagnosis Date  . Coronary artery disease   . Pneumonia 2000  . Arthritis   . 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 Bruises easily   . GERD (gastroesophageal reflux disease)     takes Omeprazole daily as needed for stomach pain  . Blood transfusion 2001  . Diabetes mellitus     takes Metformin daily;  . Impaired hearing     bil hearing aide  . Macular degeneration     being watched for this but hasn't been "completely" diagnosed  . Myocardial infarction ([place]) 2001  . Cancer [facility])     Assessment: 75 yo old man with stage III squamous cell lung carcinoma and  A. Fib. Pharmacy consulted for apixaban dosing. Patient currently receiving heparin at 1600 units/hr.    Plan:  Will schedule heparin drip to stop at 2200. Will start patient on apixaban '5mg'$  PO BID at that time.   Pharmacy will continue to monitor and adjust per consult.   Currie Paris  04/20/2015,1:18 PM

## 2015-04-20 NOTE — Progress Notes (Addendum)
[place] for Heparin drip dosing and monitoring   Indication: atrial fibrillation  No Known Allergies  Patient Measurements: Height: 6' (182.9 cm) Weight: 220 lb 0.3 oz (99.8 kg) IBW/kg (Calculated) : 77.6  Vital Signs: Temp: 98.6 F (37 C) (04/11 0800) Temp Source: Oral (04/11 0800) BP: 136/76 mmHg (04/11 1100) Pulse Rate: 124 (04/11 1100)   Recent Labs  04/18/15 0112  04/18/15 0504 04/18/15 0939  04/18/15 1833 04/19/15 0539 04/19/15 1014 04/19/15 1904 04/20/15 0346  HGB  --   < > 8.7*  --   --   --  8.5*  --   --  9.7*  HCT  --   --  26.4*  --   --   --  25.4*  --   --  29.2*  PLT  --   --  122*  --   --   --  136*  --   --  178  APTT  --   --   --  39*  --   --   --   --   --   --   LABPROT  --   --   --  16.9*  --   --   --   --   --   --   INR  --   --   --  1.36  --   --   --   --   --   --   HEPARINUNFRC  --   --   --   --   < > 0.20*  --  0.13* 0.21* 0.46  CREATININE  --   --  3.11*  --   --   --  2.05*  --   --  1.73*  CKTOTAL  --   --   --   --   --  68  --   --   --   --   CKMB  --   --   --   --   --  5.8*  --   --   --   --   TROPONINI <0.03  --  <0.03  --   --  <0.03  --   --   --   --   < > = values in this interval not displayed.  Estimated Creatinine Clearance: 45.1 mL/min (by C-G formula based on Cr of 1.73).   Medical History: Past Medical History  Diagnosis Date  . Coronary artery disease   . Pneumonia 2000  . Arthritis   . 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 Bruises easily   . GERD (gastroesophageal reflux disease)     takes Omeprazole daily as needed for stomach pain  . Blood transfusion 2001  . Diabetes mellitus     takes Metformin daily;  . Impaired hearing     bil hearing aide  . Macular degeneration     being watched for this but hasn't been "completely" diagnosed  . Myocardial infarction ([place]) 2001  . Cancer [facility])     Assessment: 75 yo old man with stage III squamous cell  lung carcinoma and A. Fib. Pharmacy consulted for heparin dosing and monitoring. Patient currently receiving heparin at 1600 units/hr.   Goal of Therapy:  Heparin level 0.3-0.7 units/ml Monitor platelets by anticoagulation protocol: Yes   Plan:  Anti-Xa level remains therapeutic.   Will obtain follow up anti-Xa level with am labs.  Pharmacy will continue to monitor and adjust per consult.    Currie Paris  04/20/2015,12:17 PM

## 2015-04-20 NOTE — Progress Notes (Signed)
[place] for Heparin drip dosing and monitoring   Indication: atrial fibrillation  No Known Allergies  Patient Measurements: Height: 6' (182.9 cm) Weight: 220 lb 0.3 oz (99.8 kg) IBW/kg (Calculated) : 77.6  Vital Signs: Temp: 98.6 F (37 C) (04/11 0400) Temp Source: Oral (04/11 0400) BP: 146/97 mmHg (04/11 0400) Pulse Rate: 124 (04/11 0400)   Recent Labs  04/18/15 0112 04/18/15 0504 04/18/15 0939  04/18/15 1833 04/19/15 0539 04/19/15 1014 04/19/15 1904 04/20/15 0346  HGB  --  8.7*  --   --   --  8.5*  --   --  9.7*  HCT  --  26.4*  --   --   --  25.4*  --   --  29.2*  PLT  --  122*  --   --   --  136*  --   --  178  APTT  --   --  39*  --   --   --   --   --   --   LABPROT  --   --  16.9*  --   --   --   --   --   --   INR  --   --  1.36  --   --   --   --   --   --   HEPARINUNFRC  --   --   --   < > 0.20*  --  0.13* 0.21* 0.46  CREATININE  --  3.11*  --   --   --  2.05*  --   --  1.73*  CKTOTAL  --   --   --   --  68  --   --   --   --   CKMB  --   --   --   --  5.8*  --   --   --   --   TROPONINI <0.03 <0.03  --   --  <0.03  --   --   --   --   < > = values in this interval not displayed.  Estimated Creatinine Clearance: 45.1 mL/min (by C-G formula based on Cr of 1.73).   Medical History: Past Medical History  Diagnosis Date  . Coronary artery disease   . Pneumonia 2000  . Arthritis   . 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 Bruises easily   . GERD (gastroesophageal reflux disease)     takes Omeprazole daily as needed for stomach pain  . Blood transfusion 2001  . Diabetes mellitus     takes Metformin daily;  . Impaired hearing     bil hearing aide  . Macular degeneration     being watched for this but hasn't been "completely" diagnosed  . Myocardial infarction ([place]) 2001  . Cancer [facility])     Assessment: 75 yo old man with stage III squamous cell lung carcinoma and A. Fib. Pharmacy consulted for  heparin dosing and monitoring. Patient currently receiving heparin at 1100 units/hr.   Goal of Therapy:  Heparin level 0.3-0.7 units/ml Monitor platelets by anticoagulation protocol: Yes   Plan:  Heparin level therapeutic x 1. Continue current rate. Will recheck heparin level in 8 hours.  [place]ington Krotz A. [place], [place].D., BCPS Clinical Pharmacist  04/20/2015,4:47 AM

## 2015-04-20 NOTE — Progress Notes (Signed)
[facility]  ROUNDING NOTE   Subjective:  Creatinine currently down to 1.7. Heart rate still high in the 110's Serum sodium up to 134.   Objective:  Vital signs in last 24 hours:  Temp:  [98.2 F (36.8 C)-98.6 F (37 C)] 98.6 F (37 C) (04/11 0400) Pulse Rate:  [56-135] 56 (04/11 0700) Resp:  [18-34] 34 (04/11 0700) BP: (114-161)/(68-126) 141/126 mmHg (04/11 0700) SpO2:  [94 %-100 %] 96 % (04/11 0700) FiO2 (%):  [35 %-40 %] 35 % (04/10 1633)  Weight change:  Filed Weights   04/17/15 1141 04/17/15 1419  Weight: 97.977 kg (216 lb) 99.8 kg (220 lb 0.3 oz)    Intake/Output: I/O last 3 completed shifts: In: 6095.3 [P.O.:620; I.V.:4975.3; IV Piggyback:500] Out: 5784 [Urine:4350]   Intake/Output this shift:     Physical Exam: General: Critically ill appearing  Head: Normocephalic, atraumatic. Moist oral mucosal membranes, high flow [place]  Eyes: Anicteric  Neck: Supple, trachea midline  Lungs:  Scattered rhonchi, normal effort  Heart: S1S2 irregular  Abdomen:  Soft, nontender, BS present   Extremities: no peripheral edema.  Neurologic: Nonfocal, moving all four extremities  Skin: No lesions       Basic Metabolic Panel:  Recent Labs Lab 04/14/15 0857 04/17/15 1205 04/17/15 1929 04/18/15 0504 04/19/15 0539 04/20/15 0346  NA  --  130* 131* 130* 128* 134*  K  --  4.7 4.9 4.8 3.3* 4.3  CL  --  102 105 106 99* 105  CO2  --  15* 13* 14* 13* 21*  GLUCOSE  --  112* 141* 254* 696* 189*  BUN  --  55* 55* 60* 50* 47*  CREATININE  --  3.59* 3.46* 3.11* 2.05* 1.73*  CALCIUM  --  8.3* 7.9* 7.7* 6.7* 8.2*  MG 1.9  --  1.7 1.9 2.0  --   PHOS  --   --   --  4.2  --   --     Liver Function Tests:  Recent Labs Lab 04/14/15 0836  AST 29  ALT 21  ALKPHOS 31*  BILITOT 0.5  PROT 7.2  ALBUMIN 3.5   No results for input(s): LIPASE, AMYLASE in the last 168 hours. No results for input(s): AMMONIA in the last 168 hours.  CBC:  Recent Labs Lab  04/14/15 0836 04/17/15 1205 04/18/15 0504 04/19/15 0539 04/20/15 0346  WBC 5.8 8.4 7.3 10.8* 16.0*  NEUTROABS 3.9 7.5*  --   --   --   HGB 10.8* 10.0* 8.7* 8.5* 9.7*  HCT 32.8* 30.3* 26.4* 25.4* 29.2*  MCV 84.1 83.9 83.1 84.5 81.9  PLT 162 154 122* 136* 178    Cardiac Enzymes:  Recent Labs Lab 04/17/15 1205 04/17/15 1929 04/18/15 0112 04/18/15 0504 04/18/15 1833  CKTOTAL  --   --   --   --  68  CKMB  --   --   --   --  5.8*  TROPONINI <0.03 0.06* <0.03 <0.03 <0.03    BNP: Invalid input(s): POCBNP  CBG:  Recent Labs Lab 04/19/15 0747 04/19/15 1143 04/19/15 1651 04/19/15 2133 04/20/15 0745  GLUCAP 263* 260* 63* 170* 44*    Microbiology: Results for orders placed or performed during the hospital encounter of 04/17/15  Blood Culture (routine x 2)     Status: Abnormal   Collection Time: 04/17/15 12:05 PM  Result Value Ref Range Status   Specimen Description BLOOD RIGHT CHEST  Final   Special Requests BOTTLES DRAWN AEROBIC AND ANAEROBIC  3CC  Final   Culture  Setup Time   Final    GRAM NEGATIVE RODS IN BOTH AEROBIC AND ANAEROBIC BOTTLES CRITICAL RESULT CALLED TO, READ BACK BY AND VERIFIED WITH: Sheema Hallaji @ [id number] 04/18/15 by [facility] [place] [facility]    Culture (A)  Final    HAEMOPHILUS INFLUENZAE IN BOTH AEROBIC AND ANAEROBIC BOTTLES BETA LACTAMASE NEGATIVE Referred to [facility] in [place], [place] for Serotyping.    Report Status 04/20/2015 FINAL  Final  Blood Culture ID Panel (Reflexed)     Status: Abnormal   Collection Time: 04/17/15 12:05 PM  Result Value Ref Range Status   Enterococcus species NOT DETECTED NOT DETECTED Final   Vancomycin resistance NOT DETECTED NOT DETECTED Final   Listeria monocytogenes NOT DETECTED NOT DETECTED Final   Staphylococcus species NOT DETECTED NOT DETECTED Final   Staphylococcus aureus NOT DETECTED NOT DETECTED Final   Methicillin resistance NOT DETECTED NOT DETECTED Final   Streptococcus species NOT  DETECTED NOT DETECTED Final   Streptococcus agalactiae NOT DETECTED NOT DETECTED Final   Streptococcus pneumoniae NOT DETECTED NOT DETECTED Final   Streptococcus pyogenes NOT DETECTED NOT DETECTED Final   Acinetobacter baumannii NOT DETECTED NOT DETECTED Final   Enterobacteriaceae species NOT DETECTED NOT DETECTED Final   Enterobacter cloacae complex NOT DETECTED NOT DETECTED Final   Escherichia coli NOT DETECTED NOT DETECTED Final   Klebsiella oxytoca NOT DETECTED NOT DETECTED Final   Klebsiella pneumoniae NOT DETECTED NOT DETECTED Final   Proteus species NOT DETECTED NOT DETECTED Final   Serratia marcescens NOT DETECTED NOT DETECTED Final   Carbapenem resistance NOT DETECTED NOT DETECTED Final   Haemophilus influenzae DETECTED (A) NOT DETECTED Final    Comment: CRITICAL RESULT CALLED TO, READ BACK BY AND VERIFIED WITH: Sheema Hallaji @ [id number] 04/18/15 by [place]    Neisseria meningitidis NOT DETECTED NOT DETECTED Final   Pseudomonas aeruginosa NOT DETECTED NOT DETECTED Final   Candida albicans NOT DETECTED NOT DETECTED Final   Candida glabrata NOT DETECTED NOT DETECTED Final   Candida krusei NOT DETECTED NOT DETECTED Final   Candida parapsilosis NOT DETECTED NOT DETECTED Final   Candida tropicalis NOT DETECTED NOT DETECTED Final  Blood Culture (routine x 2)     Status: None (Preliminary result)   Collection Time: 04/17/15  1:54 PM  Result Value Ref Range Status   Specimen Description BLOOD LEFT HAND  Final   Special Requests BOTTLES DRAWN AEROBIC AND ANAEROBIC  1CC  Final   Culture NO GROWTH 2 DAYS  Final   Report Status PENDING  Incomplete  MRSA PCR Screening     Status: None   Collection Time: 04/17/15  2:19 PM  Result Value Ref Range Status   MRSA by PCR NEGATIVE NEGATIVE Final    Comment:        The GeneXpert MRSA Assay (FDA approved for NASAL specimens only), is one component of a comprehensive MRSA colonization surveillance program. It is not intended to diagnose  MRSA infection nor to guide or monitor treatment for MRSA infections.   Urine culture     Status: None   Collection Time: 04/17/15  7:29 PM  Result Value Ref Range Status   Specimen Description URINE, RANDOM  Final   Special Requests NONE  Final   Culture NO GROWTH 1 DAY  Final   Report Status 04/19/2015 FINAL  Final    Coagulation Studies:  Recent Labs  04/18/15 0939  LABPROT 16.9*  INR 1.36    Urinalysis:  Recent Labs  04/17/15 1929  COLORURINE YELLOW*  LABSPEC 1.012  PHURINE 5.0  GLUCOSEU NEGATIVE  HGBUR 2+*  BILIRUBINUR NEGATIVE  KETONESUR NEGATIVE  PROTEINUR 30*  NITRITE NEGATIVE  LEUKOCYTESUR NEGATIVE      Imaging: US Renal  04/18/2015  CLINICAL DATA:  Acute renal failure EXAM: RENAL / URINARY TRACT ULTRASOUND COMPLETE COMPARISON:  None FINDINGS: Right Kidney: Length: 12.1 cm. Echogenicity within normal limits. No mass or hydronephrosis visualized. Left Kidney: Length: 12.3 cm. Normal parenchymal echogenicity. No obstructive uropathy. There is a cyst within the inferior pole which measure 3.4 x 3.8 x 3.4 cm. Bladder: Appears normal for degree of bladder distention. IMPRESSION: 1. No acute findings.  No hydronephrosis 2. Left renal cyst. Electronically Signed   By: Kerby Moors M.D.   On: 04/18/2015 11:57   Dg Chest Port 1 View  04/19/2015  CLINICAL DATA:  Acute on chronic respiratory failure, healthcare associated pneumonia. EXAM: PORTABLE CHEST 1 VIEW COMPARISON:  Portable chest x-ray of May 18, 2015 FINDINGS: The right lung is well-expanded. Mildly increased interstitial prominence has developed in the midportion of the right lung. On the left there is persistent confluent alveolar opacity in the upper and lower lobes. The hemidiaphragm remains obscured. There is no mediastinal shift. The cardiac silhouette is top-normal in size. There are post CABG changes. There are multiple broken upper sternal wires. The Port-A-Cath appliance tip projects over the midportion  of the SVC. IMPRESSION: Stable appearance of the left lung consistent with postobstructive atelectasis and pneumonia. On the right slightly increased interstitial density in the mid lung may reflect developing interstitial pneumonia or edema. Electronically Signed   By: David  Martinique M.D.   On: 04/19/2015 07:23     Medications:   . heparin 1,600 Units/hr (04/20/15 0700)  .  sodium bicarbonate 150 mEq in sterile water 1000 mL infusion 75 mL/hr at 04/20/15 0700   . amiodarone  400 mg Oral BID  . ampicillin-sulbactam (UNASYN) IV  3 g Intravenous Q6H  . antiseptic oral rinse  7 mL Mouth Rinse q12n4p  . aspirin EC  81 mg Oral Daily  . budesonide (PULMICORT) nebulizer solution  0.5 mg Nebulization BID  . chlorhexidine  15 mL Mouth Rinse BID  . heparin  3,000 Units Intravenous Once  . insulin aspart  0-20 Units Subcutaneous TID WC  . insulin aspart  0-5 Units Subcutaneous QHS  . insulin glargine  20 Units Subcutaneous Daily  . ipratropium  0.5 mg Nebulization Q6H  . levalbuterol  0.63 mg Nebulization 4 times per day  . magnesium oxide  400 mg Oral Daily  . multivitamin-lutein  1 capsule Oral Daily  . nystatin  5 mL Oral QID  . pantoprazole  40 mg Oral Daily  . simvastatin  40 mg Oral QHS  . sodium chloride flush  3 mL Intravenous Q12H   acetaminophen **OR** acetaminophen, chlorpheniramine-HYDROcodone, levalbuterol, metoprolol, morphine injection, ondansetron **OR** ondansetron (ZOFRAN) IV, oxyCODONE, sodium chloride flush, traMADol  Assessment/ Plan:  75 y.o. 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 Initial workup showed left-sided pneumonitis.  1. Acute renal failure/chronic kidney disease stage III  Baseline creatinine 1.2 with EGFR 54. Acute renal failure most likely related to diarrhea, poor by mouth intake, and possibly  Naprosyn.  - Renal function continues to improve slowly. Creatinine down to 1.7 however still above baseline. Continue to monitor renal function and urine output. Continue IV fluid hydration..  2. Acute respiratory failure secondary to bacterial pneumonia. Haemophilus influenza growing on blood cultures. Continue Unasyn and supplemental oxygen.  3. Hyponatremia. Sodium improved up to 134. Continue to monitor.  4. Metabolic acidosis. Metabolic acidosis improved. Serum bicarbonate up to 21. Continue sodium bicarbonate drip for 1 additional day.   LOS: 3 Ivan Beasley 4/11/20178:40 AM

## 2015-04-20 NOTE — Progress Notes (Addendum)
[place] at [place] NAME: Ivan Beasley    MR#:  [id number]  DATE OF BIRTH:  22-Nov-1940  SUBJECTIVE:  CHIEF COMPLAINT:   Chief Complaint  Patient presents with  . Shortness of Breath   Off BIPAP and HF O2, on O2 [place] 3L. Better SOB, wheezing and cough. HR 110-120, off amiodarone drip. REVIEW OF SYSTEMS:  CONSTITUTIONAL: No fever, has weakness.  EYES: No blurred or double vision.  EARS, NOSE, AND THROAT: No tinnitus or ear pain.  RESPIRATORY: has cough, shortness of breath and wheezing, no  hemoptysis.  CARDIOVASCULAR: No chest pain, orthopnea, edema.  GASTROINTESTINAL: No nausea, vomiting, or abdominal pain. But has diarrhea. GENITOURINARY: No dysuria, hematuria.  ENDOCRINE: No polyuria, nocturia,  HEMATOLOGY: No anemia, easy bruising or bleeding SKIN: No rash or lesion. MUSCULOSKELETAL: No joint pain or arthritis.   NEUROLOGIC: No tingling, numbness, weakness.  PSYCHIATRY: No anxiety or depression.   DRUG ALLERGIES:  No Known Allergies  VITALS:  Blood pressure 136/76, pulse 124, temperature 98.6 F (37 C), temperature source Oral, resp. rate 22, height 6' (1.829 m), weight 99.8 kg (220 lb 0.3 oz), SpO2 97 %.  PHYSICAL EXAMINATION:  GENERAL:  75 y.o.-year-old patient lying in the bed with no acute distress.  EYES: Pupils equal, round, reactive to light and accommodation. No scleral icterus. Extraocular muscles intact.  HEENT: Head atraumatic, normocephalic. Oropharynx and nasopharynx clear.  NECK:  Supple, no jugular venous distention. No thyroid enlargement, no tenderness.  LUNGS: Normal breath sounds on right side but decreased BS, wheezing, rhonchi and crackles on left side. No use of accessory muscles of respiration.  CARDIOVASCULAR: S1, S2 normal. No murmurs, rubs, or gallops.  ABDOMEN: Soft, nontender, nondistended. Bowel sounds present. No organomegaly or mass.  EXTREMITIES: No pedal edema, cyanosis, or clubbing.   NEUROLOGIC: Cranial nerves II through XII are intact. Muscle strength 4/5 in all extremities. Sensation intact. Gait not checked.  PSYCHIATRIC: The patient is alert and oriented x 3.  SKIN: No obvious rash, lesion, or ulcer.    LABORATORY PANEL:   CBC  Recent Labs Lab 04/20/15 0346  WBC 16.0*  HGB 9.7*  HCT 29.2*  PLT 178   ------------------------------------------------------------------------------------------------------------------  Chemistries   Recent Labs Lab 04/14/15 0836  04/19/15 0539 04/20/15 0346  NA 131*  < > 128* 134*  K 4.0  < > 3.3* 4.3  CL 105  < > 99* 105  CO2 19*  < > 13* 21*  GLUCOSE 143*  < > 696* 189*  BUN 21*  < > 50* 47*  CREATININE 1.27*  < > 2.05* 1.73*  CALCIUM 8.2*  < > 6.7* 8.2*  MG  --   < > 2.0  --   AST 29  --   --   --   ALT 21  --   --   --   ALKPHOS 31*  --   --   --   BILITOT 0.5  --   --   --   < > = values in this interval not displayed. ------------------------------------------------------------------------------------------------------------------  Cardiac Enzymes  Recent Labs Lab 04/18/15 1833  TROPONINI <0.03   ------------------------------------------------------------------------------------------------------------------  RADIOLOGY:  Dg Chest Port 1 View  04/19/2015  CLINICAL DATA:  Acute on chronic respiratory failure, healthcare associated pneumonia. EXAM: PORTABLE CHEST 1 VIEW COMPARISON:  Portable chest x-ray of May 18, 2015 FINDINGS: The right lung is well-expanded. Mildly increased interstitial prominence has developed in the midportion of the right  lung. On the left there is persistent confluent alveolar opacity in the upper and lower lobes. The hemidiaphragm remains obscured. There is no mediastinal shift. The cardiac silhouette is top-normal in size. There are post CABG changes. There are multiple broken upper sternal wires. The Port-A-Cath appliance tip projects over the midportion of the SVC. IMPRESSION:  Stable appearance of the left lung consistent with postobstructive atelectasis and pneumonia. On the right slightly increased interstitial density in the mid lung may reflect developing interstitial pneumonia or edema. Electronically Signed   By: David  Martinique M.D.   On: 04/19/2015 07:23    EKG:   Orders placed or performed during the hospital encounter of 04/17/15  . EKG 12-Lead  . EKG 12-Lead  . EKG 12-Lead  . EKG 12-Lead  . EKG 12-Lead  . EKG 12-Lead    ASSESSMENT AND PLAN:    75 year old Caucasian gentleman known stage III squamous cell lung carcinoma on Nivolumab with Dr. Jeb Levering presenting in respiratory distress.  1. Acute on chronic respiratory failure with hypoxia:  Weaned off BiPAP to a high flow nasal cannula, changed to O2 [place] 3L since yesterday. Continue  xopenex q6h, continue dulera and added pulmincort.  * Pneumonia and bacteremia with sepsis. left pneumonitis reaction secondary to nivolumab.  Blood culture: Haemophilus influenzae.  Treated with IV steroids 10 mg Decadron every 8 hours for 24 hours.  Repeated chest x-ray showed Stable likely postobstructive atelectasis and pneumonia throughout the entire left lung. No change.  Repeat CXR tomorrow. He was treated with vancomycin and unasyn, change to augmentin.  2. Type 2 diabetes non-insulin-requiring.  hold oral agents, continue lantus and  insulin sliding scale.  3. Acute renal failure/chronic kidney disease stage III Baseline creatinine 1.2 with EGFR 54. improving. decreased  IV fluid rate, f/u BMP, renal ultrasound: no obstruction.  4. Stage III squamous cell carcinoma lung. F/u oncologist.   * Afib with RVR, treated with cardizem iv one dose, but hypotensive, put on amiodarone drip. changed to po and added cardizem po.  On ASA and heparin drip.  F/u cardiology consult.  Echo: EF 45-50%.  Per Dr. Clayborn Bigness, may change to eliquis and do not recommend invasive cardiac catheter at this stage.  * Hyponatremia.  Improved with NS iv. * Hypokalemia. Improved with KCl.   I discussed with Dr. Leonidas Romberg.  All the records are reviewed and case discussed with Care Management/Social Workerr. Management plans discussed with the patient, his wife and they are in agreement.  CODE STATUS: full code.  TOTAL CRITICAL TIME TAKING CARE OF THIS PATIENT: 41 minutes.  Greater than 50% time was spent on coordination of care and face-to-face counseling.  POSSIBLE D/C IN >3 DAYS, DEPENDING ON CLINICAL CONDITION.   Demetrios Loll M.D on 04/20/2015 at 12:37 PM  Between 7am to 6pm - Pager - [phone]  After 6pm go to [email or web address] - password [email or web address] [place] [facility]  Office  [phone]  CC: Primary care physician; Albina Billet, MD

## 2015-04-20 NOTE — Progress Notes (Signed)
Received from ICU AAOX3 . VS stable O2 at 3 L . Telemetry verified with central tele. Pt has not voided since 6 hrs post catheter d/c. ICU nurse stated she has just scan bladder and only 150cc is retained. Pt denies any discomfort or any kind of pain. Will pass it on to oncoming nurse to monitor ouput

## 2015-04-20 NOTE — Progress Notes (Signed)
Chaplain rounded the united and offered a compassionate presence and support to the patient and family. Ivan Beasley [phone]

## 2015-04-20 NOTE — Progress Notes (Signed)
PT PROFILE: 74 M with Stage IIIa Sq cell ca of lung, recent chemotherapy, acute on chronic respiratory failure due to  H flu PNA and bacteremia. Hospitalization c/b atrial flutter, AKI. Cards and nephrology following  SUBJ: Much improved. No overt distress. No new complaints  OBJ: Filed Vitals:   04/20/15 1100 04/20/15 1200 04/20/15 1209 04/20/15 1300  BP: 136/76 113/85  143/79  Pulse: 124 123  119  Temp:      TempSrc:      Resp: '22 23  23  '$ Height:      Weight:      SpO2: 95% 94% 97% 96%   NAD HEENT WNL JVP not well visualized L sided crackles, minimal R basilar crackles, no wheezes Tachy, IRIR, no M noted NABS, soft, NT Ext warm, no edema  BMP Latest Ref Rng 04/20/2015 04/19/2015 04/18/2015  Glucose 65 - 99 mg/dL 189(H) 696(HH) 254(H)  BUN 6 - 20 mg/dL 47(H) 50(H) 60(H)  Creatinine 0.61 - 1.24 mg/dL 1.73(H) 2.05(H) 3.11(H)  Sodium 135 - 145 mmol/L 134(L) 128(L) 130(L)  Potassium 3.5 - 5.1 mmol/L 4.3 3.3(L) 4.8  Chloride 101 - 111 mmol/L 105 99(L) 106  CO2 22 - 32 mmol/L 21(L) 13(L) 14(L)  Calcium 8.9 - 10.3 mg/dL 8.2(L) 6.7(L) 7.7(L)    CBC Latest Ref Rng 04/20/2015 04/19/2015 04/18/2015  WBC 3.8 - 10.6 K/uL 16.0(H) 10.8(H) 7.3  Hemoglobin 13.0 - 18.0 g/dL 9.7(L) 8.5(L) 8.7(L)  Hematocrit 40.0 - 52.0 % 29.2(L) 25.4(L) 26.4(L)  Platelets 150 - 440 K/uL 178 136(L) 122(L)   MICRO: 04/08 blood >> 1/2 positive for H flu  Anti-infectives    Start     Dose/Rate Route Frequency Ordered Stop   04/20/15 1000  amoxicillin-clavulanate (AUGMENTIN) 875-125 MG per tablet 1 tablet     1 tablet Oral 2 times daily 04/20/15 0846 04/25/15 0959   04/19/15 1000  Ampicillin-Sulbactam (UNASYN) 3 g in sodium chloride 0.9 % 100 mL IVPB  Status:  Discontinued     3 g 100 mL/hr over 60 Minutes Intravenous Every 6 hours 04/19/15 0943 04/20/15 0846   04/18/15 1400  Ampicillin-Sulbactam (UNASYN) 3 g in sodium chloride 0.9 % 100 mL IVPB  Status:  Discontinued     3 g 100 mL/hr over 60 Minutes  Intravenous Every 12 hours 04/18/15 0808 04/19/15 0943   04/17/15 2200  piperacillin-tazobactam (ZOSYN) IVPB 3.375 g  Status:  Discontinued     3.375 g 12.5 mL/hr over 240 Minutes Intravenous 3 times per day 04/17/15 1443 04/18/15 0805   04/17/15 2100  vancomycin (VANCOCIN) IVPB 1000 mg/200 mL premix     1,000 mg 200 mL/hr over 60 Minutes Intravenous  Once 04/17/15 1450 04/17/15 2125   04/17/15 1800  piperacillin-tazobactam (ZOSYN) IVPB 3.375 g  Status:  Discontinued     3.375 g 12.5 mL/hr over 240 Minutes Intravenous 3 times per day 04/17/15 1441 04/17/15 1443   04/17/15 1245  aztreonam (AZACTAM) 2 g in dextrose 5 % 50 mL IVPB     2 g 100 mL/hr over 30 Minutes Intravenous  Once 04/17/15 1232 04/17/15 1329   04/17/15 1245  vancomycin (VANCOCIN) IVPB 1000 mg/200 mL premix     1,000 mg 200 mL/hr over 60 Minutes Intravenous  Once 04/17/15 1232 04/17/15 1430      CXR: NNF  IMPRESSION: Acute on chronic hypoxic respiratory failure Squamous cell carcinoma of lung COPD - wheezing improved H flu PNA Atrial flutter > AFRVR AKI, nonoliguric - improving Hyponatremia - improving Hypokalemia -  resolved DM 2, poorly controlled Chronic anemia  PLAN/REC: Cont [place] O2  -SpO2 goal > 88% Cont abx as above Cont nebulized steroids Cont nebulized bronchodilators  Cont amiodarone load by mouth Add scheduled diltiazem 04/11 Cont low dose PRN metoprolol to maintain HR < 115/min Monitor BMET intermittently Monitor I/Os Correct electrolytes as indicated Renal Service following - HCO 3 gtt initiated 04/10. Rate decreased 04/11 Avoiding systemic steroids due to hyperglycemia Cont Lantus - initiated04/10 Cont SSI  Transfer to telemetry today (04/11) Discussed with Dr Alvy Bimler, MD PCCM service Mobile [phone] Pager [phone] 04/20/2015

## 2015-04-20 NOTE — Care Management Note (Signed)
Case Management Note  Patient Details  Name: Ivan Beasley MRN: [id number] Date of Birth: September 22, 1940  Subjective/Objective:   CM consult for discharge planning. Met with patient and his wife at bedside. Patient has a cane and walker he uses PRN. Prior to admission, patient would drive to run errands. He is more debilitated now. Requested PT consult from attending. Their daughter lives 20 feet from them in another home and can help PRN. Patient and wife agreeable to [facility] if needed but do not prefer to have it. New Eliquis. Coupon given for 30 day free.                 Action/Plan: PT consult pending.   Expected Discharge Date:                  Expected Discharge Plan:     In-House Referral:     Discharge planning Services  CM Consult  Post Acute Care Choice:    Choice offered to:     DME Arranged:    DME Agency:     HH Arranged:    HH Agency:     Status of Service:  In process, will continue to follow  Medicare Important Message Given:    Date Medicare IM Given:    Medicare IM give by:    Date Additional Medicare IM Given:    Additional Medicare Important Message give by:     If discussed at [place] of Stay Meetings, dates discussed:    Additional Comments:  Jolly Mango, RN 04/20/2015, 3:26 PM

## 2015-04-20 NOTE — Consult Note (Signed)
[facility] Infectious Disease     Reason for Consult:H flu bacteremia    Referring Physician: Estanislado Spire Date of Admission:  04/17/2015   Active Problems:   Acute on chronic respiratory failure with hypoxia (HCC)   HCAP (healthcare-associated pneumonia)   Pneumonitis   Acute respiratory failure (HCC)   HPI: Ivan Beasley is a 75 y.o. male with lung cancer, ongoing immunotherapy admitted with Acute resp failur, low grade fevers. Had recently been treated with levofloxacin. Is on Nivolumab.   Since admit has bcx + H flu. No sputum cx. Clinically improving but still on O2   Past Medical History  Diagnosis Date  . Coronary artery disease   . Pneumonia 2000  . Arthritis   . 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 Bruises easily   . GERD (gastroesophageal reflux disease)     takes Omeprazole daily as needed for stomach pain  . Blood transfusion 2001  . Diabetes mellitus     takes Metformin daily;  . Impaired hearing     bil hearing aide  . Macular degeneration     being watched for this but hasn't been "completely" diagnosed  . Myocardial infarction ([place]) 2001  . Cancer [facility])    Past Surgical History  Procedure Laterality Date  . 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 Cardiac catheterization  2001  . Tonsillectomy      as a child  . Colonoscopy    . Cataract extraction      bilateral  . Lumbar laminectomy/decompression microdiscectomy  12/19/2010    Procedure: LUMBAR LAMINECTOMY/DECOMPRESSION MICRODISCECTOMY;  Surgeon: Elaina Hoops;  Location: [facility];  Service: Neurosurgery;  Laterality: N/A;  Lumbar three-four, four-five decompressive lumbar laminectomy  . Joint replacement  right tkr  . Eye surgery      cataracts  . Back surgery      l4 5  . Portacath placement N/A 05/20/2014    Procedure: INSERTION PORT-A-CATH;  Surgeon: Nestor Lewandowsky, MD;  Location: [facility] ORS;  Service: General;  Laterality: N/A;   Social History  Substance Use Topics   . Smoking status: Former Smoker -- 40 years    Types: Cigarettes    Quit date: 05/19/1999  . Smokeless tobacco: Never Used     Comment: 2001  . Alcohol Use: No   Family History  Problem Relation Age of Onset  . Anesthesia problems Neg Hx   . Hypotension Neg Hx   . Malignant hyperthermia Neg Hx   . Pseudochol deficiency Neg Hx   . Diabetes type II Other     Allergies: No Known Allergies  Current antibiotics: Antibiotics Given (last 72 hours)    Date/Time Action Medication Dose Rate   04/17/15 2025 Given   vancomycin (VANCOCIN) IVPB 1000 mg/200 mL premix 1,000 mg 200 mL/hr   04/17/15 2145 Given   piperacillin-tazobactam (ZOSYN) IVPB 3.375 g 3.375 g 12.5 mL/hr   04/18/15 0549 Given   piperacillin-tazobactam (ZOSYN) IVPB 3.375 g 3.375 g 12.5 mL/hr   04/18/15 1447 Given   Ampicillin-Sulbactam (UNASYN) 3 g in sodium chloride 0.9 % 100 mL IVPB 3 g 100 mL/hr   04/19/15 0234 Given   Ampicillin-Sulbactam (UNASYN) 3 g in sodium chloride 0.9 % 100 mL IVPB 3 g 100 mL/hr   04/19/15 1102 Given   Ampicillin-Sulbactam (UNASYN) 3 g in sodium chloride 0.9 % 100 mL IVPB 3 g 100 mL/hr   04/19/15 1648 Given  Ampicillin-Sulbactam (UNASYN) 3 g in sodium chloride 0.9 % 100 mL IVPB 3 g 100 mL/hr   04/19/15 2135 Given   Ampicillin-Sulbactam (UNASYN) 3 g in sodium chloride 0.9 % 100 mL IVPB 3 g 100 mL/hr   04/20/15 0400 Given   Ampicillin-Sulbactam (UNASYN) 3 g in sodium chloride 0.9 % 100 mL IVPB 3 g 100 mL/hr   04/20/15 0913 Given   amoxicillin-clavulanate (AUGMENTIN) 875-125 MG per tablet 1 tablet 1 tablet       MEDICATIONS: . amiodarone  400 mg Oral BID  . amoxicillin-clavulanate  1 tablet Oral BID  . apixaban  5 mg Oral BID  . aspirin EC  81 mg Oral Daily  . atorvastatin  20 mg Oral q1800  . budesonide (PULMICORT) nebulizer solution  0.5 mg Nebulization BID  . diltiazem  60 mg Oral Q12H  . insulin aspart  0-20 Units Subcutaneous TID WC  . insulin aspart  0-5 Units Subcutaneous QHS   . insulin glargine  20 Units Subcutaneous Daily  . ipratropium  0.5 mg Nebulization Q6H  . levalbuterol  0.63 mg Nebulization 4 times per day  . magnesium oxide  400 mg Oral Daily  . multivitamin-lutein  1 capsule Oral Daily  . nystatin  5 mL Oral QID  . pantoprazole  40 mg Oral Daily  . sodium chloride flush  3 mL Intravenous Q12H    Review of Systems - 11 systems reviewed and negative per HPI   OBJECTIVE: Temp:  [97.8 F (36.6 C)-98.6 F (37 C)] 97.8 F (36.6 C) (04/11 1453) Pulse Rate:  [56-135] 60 (04/11 1453) Resp:  [19-34] 20 (04/11 1453) BP: (113-153)/(72-126) 147/76 mmHg (04/11 1453) SpO2:  [92 %-100 %] 92 % (04/11 1453) FiO2 (%):  [35 %] 35 % (04/10 1633) Physical Exam  Constitutional: He is oriented to person, place, and time. Chronically ill appearing.  HENT:  Mouth/Throat: Oropharynx is clear and moist. No oropharyngeal exudate.  Cardiovascular: Normal rate, regular rhythm and normal heart sounds.  Pulmonary/Chest: . Poor air movement, rhonchi   Abdominal: Soft. Bowel sounds are normal. He exhibits no distension. There is no tenderness.  Lymphadenopathy:  He has no cervical adenopathy.  Neurological: He is alert and oriented to person, place, and time.  Skin: Skin is warm and dry. No rash noted. No erythema.  Psychiatric: He has a normal mood and affect. His behavior is normal.     LABS: Results for orders placed or performed during the hospital encounter of 04/17/15 (from the past 48 hour(s))  Glucose, capillary     Status: Abnormal   Collection Time: 04/18/15  4:09 PM  Result Value Ref Range   Glucose-Capillary 214 (H) 65 - 99 mg/dL  Heparin level (unfractionated)     Status: Abnormal   Collection Time: 04/18/15  6:33 PM  Result Value Ref Range   Heparin Unfractionated 0.20 (L) 0.30 - 0.70 IU/mL    Comment:        IF HEPARIN RESULTS ARE BELOW EXPECTED VALUES, AND PATIENT DOSAGE HAS BEEN CONFIRMED, SUGGEST FOLLOW UP TESTING OF ANTITHROMBIN III  LEVELS.   Troponin I     Status: None   Collection Time: 04/18/15  6:33 PM  Result Value Ref Range   Troponin I <0.03 <0.031 ng/mL    Comment:        NO INDICATION OF MYOCARDIAL INJURY.   CKMB([facility] only)     Status: Abnormal   Collection Time: 04/18/15  6:33 PM  Result Value Ref Range  CK, MB 5.8 (H) 0.5 - 5.0 ng/mL  CK     Status: None   Collection Time: 04/18/15  6:33 PM  Result Value Ref Range   Total CK 68 49 - 397 U/L  Glucose, capillary     Status: Abnormal   Collection Time: 04/18/15  9:00 PM  Result Value Ref Range   Glucose-Capillary 251 (H) 65 - 99 mg/dL  Procalcitonin     Status: None   Collection Time: 04/19/15  5:39 AM  Result Value Ref Range   Procalcitonin 8.23 ng/mL    Comment:        Interpretation: PCT > 2 ng/mL: Systemic infection (sepsis) is likely, unless other causes are known. (NOTE)         ICU PCT Algorithm               Non ICU PCT Algorithm    ----------------------------     ------------------------------         PCT < 0.25 ng/mL                 PCT < 0.1 ng/mL     Stopping of antibiotics            Stopping of antibiotics       strongly encouraged.               strongly encouraged.    ----------------------------     ------------------------------       PCT level decrease by               PCT < 0.25 ng/mL       >= 80% from peak PCT       OR PCT 0.25 - 0.5 ng/mL          Stopping of antibiotics                                             encouraged.     Stopping of antibiotics           encouraged.    ----------------------------     ------------------------------       PCT level decrease by              PCT >= 0.25 ng/mL       < 80% from peak PCT        AND PCT >= 0.5 ng/mL            Continuing antibiotics                                               encouraged.       Continuing antibiotics            encouraged.    ----------------------------     ------------------------------     PCT level increase compared          PCT > 0.5  ng/mL         with peak PCT AND          PCT >= 0.5 ng/mL             Escalation of antibiotics  strongly encouraged.      Escalation of antibiotics        strongly encouraged.   Basic metabolic panel     Status: Abnormal   Collection Time: 04/19/15  5:39 AM  Result Value Ref Range   Sodium 128 (L) 135 - 145 mmol/L   Potassium 3.3 (L) 3.5 - 5.1 mmol/L   Chloride 99 (L) 101 - 111 mmol/L   CO2 13 (L) 22 - 32 mmol/L   Glucose, Bld 696 (HH) 65 - 99 mg/dL    Comment: CRITICAL RESULT CALLED TO, READ BACK BY AND VERIFIED WITH MICHELLE WILLIAMS @ 0636 ON 04/19/2015 BY CAF    BUN 50 (H) 6 - 20 mg/dL   Creatinine, Ser 2.05 (H) 0.61 - 1.24 mg/dL   Calcium 6.7 (L) 8.9 - 10.3 mg/dL   GFR calc non Af Amer 30 (L) >60 mL/min   GFR calc Af Amer 35 (L) >60 mL/min    Comment: (NOTE) The eGFR has been calculated using the CKD EPI equation. This calculation has not been validated in all clinical situations. eGFR's persistently <60 mL/min signify possible Chronic Kidney Disease.    Anion gap 16 (H) 5 - 15  CBC     Status: Abnormal   Collection Time: 04/19/15  5:39 AM  Result Value Ref Range   WBC 10.8 (H) 3.8 - 10.6 K/uL   RBC 3.01 (L) 4.40 - 5.90 MIL/uL   Hemoglobin 8.5 (L) 13.0 - 18.0 g/dL   HCT 25.4 (L) 40.0 - 52.0 %   MCV 84.5 80.0 - 100.0 fL   MCH 28.2 26.0 - 34.0 pg   MCHC 33.4 32.0 - 36.0 g/dL   RDW 17.9 (H) 11.5 - 14.5 %   Platelets 136 (L) 150 - 440 K/uL  Magnesium     Status: None   Collection Time: 04/19/15  5:39 AM  Result Value Ref Range   Magnesium 2.0 1.7 - 2.4 mg/dL  Glucose, capillary     Status: Abnormal   Collection Time: 04/19/15  6:42 AM  Result Value Ref Range   Glucose-Capillary 245 (H) 65 - 99 mg/dL  Glucose, capillary     Status: Abnormal   Collection Time: 04/19/15  7:47 AM  Result Value Ref Range   Glucose-Capillary 263 (H) 65 - 99 mg/dL  Heparin level (unfractionated)     Status: Abnormal   Collection Time: 04/19/15  10:14 AM  Result Value Ref Range   Heparin Unfractionated 0.13 (L) 0.30 - 0.70 IU/mL    Comment:        IF HEPARIN RESULTS ARE BELOW EXPECTED VALUES, AND PATIENT DOSAGE HAS BEEN CONFIRMED, SUGGEST FOLLOW UP TESTING OF ANTITHROMBIN III LEVELS.   Glucose, capillary     Status: Abnormal   Collection Time: 04/19/15 11:43 AM  Result Value Ref Range   Glucose-Capillary 260 (H) 65 - 99 mg/dL  Glucose, capillary     Status: Abnormal   Collection Time: 04/19/15  4:51 PM  Result Value Ref Range   Glucose-Capillary 226 (H) 65 - 99 mg/dL  Heparin level (unfractionated)     Status: Abnormal   Collection Time: 04/19/15  7:04 PM  Result Value Ref Range   Heparin Unfractionated 0.21 (L) 0.30 - 0.70 IU/mL    Comment:        IF HEPARIN RESULTS ARE BELOW EXPECTED VALUES, AND PATIENT DOSAGE HAS BEEN CONFIRMED, SUGGEST FOLLOW UP TESTING OF ANTITHROMBIN III LEVELS.   Glucose, capillary     Status: Abnormal   Collection  Time: 04/19/15  9:33 PM  Result Value Ref Range   Glucose-Capillary 170 (H) 65 - 99 mg/dL  Basic metabolic panel     Status: Abnormal   Collection Time: 04/20/15  3:46 AM  Result Value Ref Range   Sodium 134 (L) 135 - 145 mmol/L   Potassium 4.3 3.5 - 5.1 mmol/L   Chloride 105 101 - 111 mmol/L   CO2 21 (L) 22 - 32 mmol/L   Glucose, Bld 189 (H) 65 - 99 mg/dL   BUN 47 (H) 6 - 20 mg/dL   Creatinine, Ser 1.73 (H) 0.61 - 1.24 mg/dL   Calcium 8.2 (L) 8.9 - 10.3 mg/dL   GFR calc non Af Amer 37 (L) >60 mL/min   GFR calc Af Amer 43 (L) >60 mL/min    Comment: (NOTE) The eGFR has been calculated using the CKD EPI equation. This calculation has not been validated in all clinical situations. eGFR's persistently <60 mL/min signify possible Chronic Kidney Disease.    Anion gap 8 5 - 15  CBC     Status: Abnormal   Collection Time: 04/20/15  3:46 AM  Result Value Ref Range   WBC 16.0 (H) 3.8 - 10.6 K/uL   RBC 3.57 (L) 4.40 - 5.90 MIL/uL   Hemoglobin 9.7 (L) 13.0 - 18.0 g/dL   HCT  29.2 (L) 40.0 - 52.0 %   MCV 81.9 80.0 - 100.0 fL   MCH 27.2 26.0 - 34.0 pg   MCHC 33.2 32.0 - 36.0 g/dL   RDW 18.0 (H) 11.5 - 14.5 %   Platelets 178 150 - 440 K/uL  Heparin level (unfractionated)     Status: None   Collection Time: 04/20/15  3:46 AM  Result Value Ref Range   Heparin Unfractionated 0.46 0.30 - 0.70 IU/mL    Comment:        IF HEPARIN RESULTS ARE BELOW EXPECTED VALUES, AND PATIENT DOSAGE HAS BEEN CONFIRMED, SUGGEST FOLLOW UP TESTING OF ANTITHROMBIN III LEVELS.   Glucose, capillary     Status: Abnormal   Collection Time: 04/20/15  7:45 AM  Result Value Ref Range   Glucose-Capillary 179 (H) 65 - 99 mg/dL  Glucose, capillary     Status: Abnormal   Collection Time: 04/20/15 11:37 AM  Result Value Ref Range   Glucose-Capillary 277 (H) 65 - 99 mg/dL  Heparin level (unfractionated)     Status: None   Collection Time: 04/20/15 11:55 AM  Result Value Ref Range   Heparin Unfractionated 0.36 0.30 - 0.70 IU/mL    Comment:        IF HEPARIN RESULTS ARE BELOW EXPECTED VALUES, AND PATIENT DOSAGE HAS BEEN CONFIRMED, SUGGEST FOLLOW UP TESTING OF ANTITHROMBIN III LEVELS.    No components found for: ESR, C REACTIVE PROTEIN MICRO: Recent Results (from the past 720 hour(s))  Blood Culture (routine x 2)     Status: Abnormal   Collection Time: 04/17/15 12:05 PM  Result Value Ref Range Status   Specimen Description BLOOD RIGHT CHEST  Final   Special Requests BOTTLES DRAWN AEROBIC AND ANAEROBIC  3CC  Final   Culture  Setup Time   Final    GRAM NEGATIVE RODS IN BOTH AEROBIC AND ANAEROBIC BOTTLES CRITICAL RESULT CALLED TO, READ BACK BY AND VERIFIED WITH: Sheema Hallaji @ 1610 04/18/15 by [facility]    Culture (A)  Final    HAEMOPHILUS INFLUENZAE IN BOTH AEROBIC AND ANAEROBIC BOTTLES BETA LACTAMASE NEGATIVE Referred to [facility] in [place], [place].    Report Status 04/20/2015 FINAL  Final  Blood Culture ID Panel (Reflexed)     Status:  Abnormal   Collection Time: 04/17/15 12:05 PM  Result Value Ref Range Status   Enterococcus species NOT DETECTED NOT DETECTED Final   Vancomycin resistance NOT DETECTED NOT DETECTED Final   Listeria monocytogenes NOT DETECTED NOT DETECTED Final   Staphylococcus species NOT DETECTED NOT DETECTED Final   Staphylococcus aureus NOT DETECTED NOT DETECTED Final   Methicillin resistance NOT DETECTED NOT DETECTED Final   Streptococcus species NOT DETECTED NOT DETECTED Final   Streptococcus agalactiae NOT DETECTED NOT DETECTED Final   Streptococcus pneumoniae NOT DETECTED NOT DETECTED Final   Streptococcus pyogenes NOT DETECTED NOT DETECTED Final   Acinetobacter baumannii NOT DETECTED NOT DETECTED Final   Enterobacteriaceae species NOT DETECTED NOT DETECTED Final   Enterobacter cloacae complex NOT DETECTED NOT DETECTED Final   Escherichia coli NOT DETECTED NOT DETECTED Final   Klebsiella oxytoca NOT DETECTED NOT DETECTED Final   Klebsiella pneumoniae NOT DETECTED NOT DETECTED Final   Proteus species NOT DETECTED NOT DETECTED Final   Serratia marcescens NOT DETECTED NOT DETECTED Final   Carbapenem resistance NOT DETECTED NOT DETECTED Final   Haemophilus influenzae DETECTED (A) NOT DETECTED Final    Comment: CRITICAL RESULT CALLED TO, READ BACK BY AND VERIFIED WITH: Sheema Hallaji @ [id number] 04/18/15 by [place]    Neisseria meningitidis NOT DETECTED NOT DETECTED Final   Pseudomonas aeruginosa NOT DETECTED NOT DETECTED Final   Candida albicans NOT DETECTED NOT DETECTED Final   Candida glabrata NOT DETECTED NOT DETECTED Final   Candida krusei NOT DETECTED NOT DETECTED Final   Candida parapsilosis NOT DETECTED NOT DETECTED Final   Candida tropicalis NOT DETECTED NOT DETECTED Final  Blood Culture (routine x 2)     Status: None (Preliminary result)   Collection Time: 04/17/15  1:54 PM  Result Value Ref Range Status   Specimen Description BLOOD LEFT HAND  Final   Special Requests BOTTLES DRAWN AEROBIC AND  ANAEROBIC  1CC  Final   Culture NO GROWTH 2 DAYS  Final   Report Status PENDING  Incomplete  MRSA PCR Screening     Status: None   Collection Time: 04/17/15  2:19 PM  Result Value Ref Range Status   MRSA by PCR NEGATIVE NEGATIVE Final    Comment:        The GeneXpert MRSA Assay (FDA approved for NASAL specimens only), is one component of a comprehensive MRSA colonization surveillance program. It is not intended to diagnose MRSA infection nor to guide or monitor treatment for MRSA infections.   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 EXAM: Portable CHEST 1 VIEW COMPARISON:  03/10/2015 and earlier, including CT chest 01/05/2015 and earlier and PET-CT 09/10/2014. FINDINGS: Since the chest x-ray 5 weeks ago, development of confluent airspace opacities through out the left lung, associated with volume  loss and mediastinal shift to the left. Suboptimal inspiration accounts for mild right basilar atelectasis. Right lung otherwise clear. Prior sternotomy for CABG. Cardiac silhouette normal in size, unchanged. Right jugular Port-A-Cath tip projects over the lower SVC, unchanged. IMPRESSION: 1. Likely postobstructive atelectasis and pneumonia involving the entire left lung, new since a chest x-ray 5 weeks ago. 2. Suboptimal inspiration accounts for mild right basilar atelectasis. Right lung otherwise clear. Electronically Signed   By: Evangeline Dakin M.D.   On: 04/17/2015 12:30   US  Renal  04/18/2015  CLINICAL DATA:  Acute renal failure EXAM: RENAL / URINARY TRACT ULTRASOUND COMPLETE COMPARISON:  None FINDINGS: Right Kidney: Length: 12.1 cm. Echogenicity within normal limits. No mass or hydronephrosis visualized. Left Kidney: Length: 12.3 cm. Normal parenchymal echogenicity. No obstructive uropathy. There is a cyst within the inferior pole which measure 3.4 x 3.8 x 3.4 cm. Bladder: Appears normal for degree of bladder distention. IMPRESSION: 1. No acute findings.  No hydronephrosis 2. Left renal cyst. Electronically Signed   By: Kerby Moors M.D.   On: 04/18/2015 11:57   Dg Chest Port 1 View  04/19/2015  CLINICAL DATA:  Acute on chronic respiratory failure, healthcare associated pneumonia. EXAM: PORTABLE CHEST 1 VIEW COMPARISON:  Portable chest x-ray of May 18, 2015 FINDINGS: The right lung is well-expanded. Mildly increased interstitial prominence has developed in the midportion of the right lung. On the left there is persistent confluent alveolar opacity in the upper and lower lobes. The hemidiaphragm remains obscured. There is no mediastinal shift. The cardiac silhouette is top-normal in size. There are post CABG changes. There are multiple broken upper sternal wires. The Port-A-Cath appliance tip projects over the midportion of the SVC. IMPRESSION: Stable appearance of the left lung consistent with postobstructive atelectasis and pneumonia. On the right slightly increased interstitial density in the mid lung may reflect developing interstitial pneumonia or edema. Electronically Signed   By: Dmarcus Decicco  [place] M.D.   On: 04/19/2015 07:23   Dg Chest Port 1 View  04/18/2015  CLINICAL DATA:  Followup postobstructive atelectasis and pneumonia involving the left lung. Current history of left upper lobe lung cancer. EXAM: PORTABLE CHEST 1 VIEW COMPARISON:  04/17/2015 and earlier, including CT chest 01/05/2015. FINDINGS: Sternotomy for CABG. Cardiac silhouette normal in size. Consolidation  throughout the left lung with mediastinal shift to the left, unchanged since yesterday. Right lung emphysematous though clear. Right jugular Port-A-Cath tip projects over the lower SVC. IMPRESSION: 1. Stable likely postobstructive atelectasis and pneumonia throughout the entire left lung. 2. Right lung remains clear.  No new abnormalities. Electronically Signed   By: Evangeline Dakin M.D.   On: 04/18/2015 07:59    Assessment:   Ivan Beasley is a 75 y.o. male with lung cancer, ongoing immunotherapy admitted with Acute resp failur, low grade fevers. Had recently been treated with levofloxacin. Is on Nivolumab.  Felt likely pneumonitis from this and treated with prednisone Since admit has bcx + H flu. No sputum cx. Clinically improving but still on O2  Day 4 of appropriate therapy Complete 10 more days of augmentin Recommendations Complete 10 more days of augmentin   Thank you very much for allowing me to participate in the care of this patient. Please call with questions.   Cheral Marker. Ola Spurr, MD

## 2015-04-20 NOTE — Progress Notes (Signed)
Pt has remained A & O x 4 with no c/o pain. Lung sounds with scattered rhonchi, O2 sats >90 % on 3LNC. HR has remained in the 120's despite PRN Metoprolol 2.'5mg'$  IV in addition to PO Cardizem and Amiodarone. A Fib/ST on the cardiac monitor. Foley was removed this morning. Awaiting for patient to void. Wife is at bedside. Transfers orders to telemetry today.

## 2015-04-21 ENCOUNTER — Inpatient Hospital Stay: Payer: Medicare HMO

## 2015-04-21 DIAGNOSIS — F329 Major depressive disorder, single episode, unspecified: Secondary | ICD-10-CM

## 2015-04-21 LAB — CBC
HEMATOCRIT: 29.8 % — AB (ref 40.0–52.0)
HEMOGLOBIN: 9.8 g/dL — AB (ref 13.0–18.0)
MCH: 27 pg (ref 26.0–34.0)
MCHC: 33 g/dL (ref 32.0–36.0)
MCV: 81.8 fL (ref 80.0–100.0)
Platelets: 211 10*3/uL (ref 150–440)
RBC: 3.64 MIL/uL — ABNORMAL LOW (ref 4.40–5.90)
RDW: 18 % — AB (ref 11.5–14.5)
WBC: 17.6 10*3/uL — AB (ref 3.8–10.6)

## 2015-04-21 LAB — BASIC METABOLIC PANEL
ANION GAP: 8 (ref 5–15)
BUN: 43 mg/dL — ABNORMAL HIGH (ref 6–20)
CALCIUM: 8.2 mg/dL — AB (ref 8.9–10.3)
CO2: 24 mmol/L (ref 22–32)
Chloride: 104 mmol/L (ref 101–111)
Creatinine, Ser: 1.49 mg/dL — ABNORMAL HIGH (ref 0.61–1.24)
GFR, EST AFRICAN AMERICAN: 51 mL/min — AB (ref >60)
GFR, EST NON AFRICAN AMERICAN: 44 mL/min — AB (ref >60)
Glucose, Bld: 163 mg/dL — ABNORMAL HIGH (ref 65–99)
POTASSIUM: 4.2 mmol/L (ref 3.5–5.1)
SODIUM: 136 mmol/L (ref 135–145)

## 2015-04-21 LAB — GLUCOSE, CAPILLARY
GLUCOSE-CAPILLARY: 162 mg/dL — AB (ref 65–99)
GLUCOSE-CAPILLARY: 119 mg/dL — AB (ref 65–99)
Glucose-Capillary: 201 mg/dL — ABNORMAL HIGH (ref 65–99)
Glucose-Capillary: 172 mg/dL — ABNORMAL HIGH (ref 65–99)

## 2015-04-21 MED ORDER — TIOTROPIUM BROMIDE MONOHYDRATE 18 MCG IN CAPS
18.0000 ug | ORAL_CAPSULE | Freq: Every day | RESPIRATORY_TRACT | Status: DC
  Administered 2015-04-21 – 2015-04-23 (×3): 18 ug via RESPIRATORY_TRACT
  Filled 2015-04-21: qty 5

## 2015-04-21 MED ORDER — MIRTAZAPINE 15 MG PO TABS
15.0000 mg | ORAL_TABLET | Freq: Every day | ORAL | Status: DC
  Administered 2015-04-21 – 2015-04-22 (×2): 15 mg via ORAL
  Filled 2015-04-21 (×2): qty 1

## 2015-04-21 MED ORDER — MOMETASONE FURO-FORMOTEROL FUM 200-5 MCG/ACT IN AERO
2.0000 | INHALATION_SPRAY | Freq: Two times a day (BID) | RESPIRATORY_TRACT | Status: DC
  Administered 2015-04-21 – 2015-04-23 (×5): 2 via RESPIRATORY_TRACT
  Filled 2015-04-21: qty 8.8

## 2015-04-21 MED ORDER — LORAZEPAM 2 MG/ML IJ SOLN
1.0000 mg | Freq: Once | INTRAMUSCULAR | Status: AC
Start: 2015-04-21 — End: 2015-04-21
  Administered 2015-04-21: 1 mg via INTRAVENOUS
  Filled 2015-04-21: qty 1

## 2015-04-21 MED ORDER — ALPRAZOLAM 0.25 MG PO TABS
0.2500 mg | ORAL_TABLET | Freq: Three times a day (TID) | ORAL | Status: DC | PRN
  Administered 2015-04-21 – 2015-04-23 (×4): 0.25 mg via ORAL
  Filled 2015-04-21 (×4): qty 1

## 2015-04-21 NOTE — Progress Notes (Signed)
[facility] -  AF[facility] at [facility]   PATIENT NAME: Ivan Beasley    MR#:  [id number]  DATE OF BIRTH:  11/16/1940  SUBJECTIVE:  CHIEF COMPLAINT:   Chief Complaint  Patient presents with  . Shortness of Breath   Off BIPAP and HF O2, on O2 [place] 4.5 L last night. Better SOB and cough, no wheezing. He is anxious and cried. REVIEW OF SYSTEMS:  CONSTITUTIONAL: No fever, has weakness.  EYES: No blurred or double vision.  EARS, NOSE, AND THROAT: No tinnitus or ear pain.  RESPIRATORY: has cough, shortness of breath and wheezing, no  hemoptysis.  CARDIOVASCULAR: No chest pain, orthopnea, edema.  GASTROINTESTINAL: No nausea, vomiting, or abdominal pain, no diarrhea. GENITOURINARY: No dysuria, hematuria.  ENDOCRINE: No polyuria, nocturia,  HEMATOLOGY: No anemia, easy bruising or bleeding SKIN: No rash or lesion. MUSCULOSKELETAL: No joint pain or arthritis.   NEUROLOGIC: No tingling, numbness, weakness.  PSYCHIATRY: has anxiety but no depression.   DRUG ALLERGIES:  No Known Allergies  VITALS:  Blood pressure 133/63, pulse 104, temperature 97.9 F (36.6 C), temperature source Oral, resp. rate 18, height 6' (1.829 m), weight 99.8 kg (220 lb 0.3 oz), SpO2 93 %.  PHYSICAL EXAMINATION:  GENERAL:  75 y.o.-year-old patient lying in the bed with no acute distress.  EYES: Pupils equal, round, reactive to light and accommodation. No scleral icterus. Extraocular muscles intact.  HEENT: Head atraumatic, normocephalic. Oropharynx and nasopharynx clear.  NECK:  Supple, no jugular venous distention. No thyroid enlargement, no tenderness.  LUNGS: Normal breath sounds on right side, no wheezing or rhonchi, but has mild crackles on left side. No use of accessory muscles of respiration.  CARDIOVASCULAR: S1, S2 normal. No murmurs, rubs, or gallops.  ABDOMEN: Soft, nontender, nondistended. Bowel sounds present. No organomegaly or mass.  EXTREMITIES: No pedal edema, cyanosis, or  clubbing.  NEUROLOGIC: Cranial nerves II through XII are intact. Muscle strength 4/5 in all extremities. Sensation intact. Gait not checked.  PSYCHIATRIC: The patient is alert and oriented x 3.  SKIN: No obvious rash, lesion, or ulcer.    LABORATORY PANEL:   CBC  Recent Labs Lab 04/21/15 0404  WBC 17.6*  HGB 9.8*  HCT 29.8*  PLT 211   ------------------------------------------------------------------------------------------------------------------  Chemistries   Recent Labs Lab 04/19/15 0539  04/21/15 0404  NA 128*  < > 136  K 3.3*  < > 4.2  CL 99*  < > 104  CO2 13*  < > 24  GLUCOSE 696*  < > 163*  BUN 50*  < > 43*  CREATININE 2.05*  < > 1.49*  CALCIUM 6.7*  < > 8.2*  MG 2.0  --   --   < > = values in this interval not displayed. ------------------------------------------------------------------------------------------------------------------  Cardiac Enzymes  Recent Labs Lab 04/18/15 1833  TROPONINI <0.03   ------------------------------------------------------------------------------------------------------------------  RADIOLOGY:  Dg Chest 2 View  04/21/2015  CLINICAL DATA:  History of pneumonia, cough and chest congestion and shortness of breath, follow-up study. EXAM: CHEST  2 VIEW COMPARISON:  Portable chest x-ray of April 19, 2015 FINDINGS: Confluent alveolar opacities have decreased in conspicuity on the left. There remains subtle increased interstitial density in the right mid lung. The cardiac silhouette remains largely obscured. The pulmonary vascularity is not engorged. 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 The Port-A-Cath appliance tip projects over the midportion of the SVC. IMPRESSION: Slight interval improvement in diffuse alveolar pneumonia in the left lung. Persistent mildly increased interstitial density   in the right mid lung. Electronically Signed   By: David  Jordan M.D.   On: 04/21/2015 09:24    EKG:   Orders placed or performed  during the hospital encounter of 04/17/15  . EKG 12-Lead  . EKG 12-Lead  . EKG 12-Lead  . EKG 12-Lead  . EKG 12-Lead  . 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 Choski presenting in respiratory distress.  1. Acute on chronic respiratory failure with hypoxia:  Weaned off BiPAP to a high flow nasal cannula, changed to O2 . Continue  xopenex q6h, continue dulera and added pulmincort.  * Pneumonia and bacteremia with sepsis. left pneumonitis reaction secondary to nivolumab.  Blood culture: Haemophilus influenzae.  Treated with IV steroids 10 mg Decadron every 8 hours for 24 hours.  Repeated chest x-ray showed Stable likely postobstructive atelectasis and pneumonia throughout the entire left lung. No change.  Repeat CXR today: Slight interval improvement in diffuse alveolar pneumonia in the left lung. Persistent mildly increased interstitial density in the right mid lung. He was treated with vancomycin and unasyn, changed to augmentin. Complete 10 more days of augmentin per Dr. Fitzgerald.  2. Type 2 diabetes non-insulin-requiring.  hold oral agents, continue lantus and  insulin sliding scale.  3. Acute renal failure/chronic kidney disease stage III Baseline creatinine 1.2 with EGFR 54. improving with IV fluid, f/u BMP, renal ultrasound: no obstruction.  4. Stage III squamous cell carcinoma lung. F/u oncologist.   * Afib with RVR. Rated controlled. He was treated with cardizem iv one dose, but hypotensive, put on amiodarone drip. changed to po and added cardizem po.  He was on ASA and heparin drip. Echo: EF 45-50%.  Per Dr. Callwood, may change to eliquis and do not recommend invasive cardiac catheter at this stage. I discussed with the pt and his wife about benefit and side effect of bleeding of eliquis and started eliquis PTD.  * Hyponatremia. Improved with NS iv. * Hypokalemia.  Improved with KCl.  anxiety. Xanax prn.    I discussed with  Dr. Fitzgerald.  All the records are reviewed and case discussed with Care Management/Social Workerr. Management plans discussed with the patient, his wife and they are in agreement.  CODE STATUS: full code.  TOTAL CRITICAL TIME TAKING CARE OF THIS PATIENT: 46 minutes.  Greater than 50% time was spent on coordination of care and face-to-face counseling.  POSSIBLE D/C IN 2-3 DAYS, DEPENDING ON CLINICAL CONDITION.   ,  M.D on 04/21/2015 at 11:28 AM  Between 7am to 6pm - Pager - [phone]  After 6pm go to [email or web address] - password [email or web address]  [facility]  Office  [phone]  CC: Primary care physician; TATE,DENNY C, MD 

## 2015-04-21 NOTE — Care Management Important Message (Signed)
Important Message  Patient Details  Name: Ivan Beasley MRN: [id number] Date of Birth: [birth year]   Medicare Important Message Given:  Yes    [name] 04/21/2015, 1:50 PM

## 2015-04-21 NOTE — Progress Notes (Signed)
[facility]  ROUNDING NOTE   Subjective:  Creatinine down to 1.49. Patient still having some cough that is productive ofsputum. Good urine output noted over the preceding 3 shifts.   Objective:  Vital signs in last 24 hours:  Temp:  [97.6 F (36.4 C)-97.9 F (36.6 C)] 97.9 F (36.6 C) (04/12 1112) Pulse Rate:  [60-136] 104 (04/12 1112) Resp:  [18-24] 18 (04/12 1112) BP: (133-157)/(63-79) 133/63 mmHg (04/12 1112) SpO2:  [89 %-97 %] 93 % (04/12 1112)  Weight change:  Filed Weights   04/17/15 1141 04/17/15 1419  Weight: 97.977 kg (216 lb) 99.8 kg (220 lb 0.3 oz)    Intake/Output: I/O last 3 completed shifts: In: 3057.6 [P.O.:360; I.V.:2297.6; IV Piggyback:400] Out: 3200 [Urine:3200]   Intake/Output this shift:  Total I/O In: 120 [P.O.:120] Out: 825 [Urine:825]  Physical Exam: General: No acute distress  Head: Normocephalic, atraumatic. Moist oral mucosal membranes  Eyes: Anicteric  Neck: Supple, trachea midline  Lungs:  Scattered rhonchi, normal effort  Heart: S1S2 irregular  Abdomen:  Soft, nontender, BS present   Extremities: no peripheral edema.  Neurologic: Nonfocal, moving all four extremities  Skin: No lesions       Basic Metabolic Panel:  Recent Labs Lab 04/17/15 1929 04/18/15 0504 04/19/15 0539 04/20/15 0346 04/21/15 0404  NA 131* 130* 128* 134* 136  K 4.9 4.8 3.3* 4.3 4.2  CL 105 106 99* 105 104  CO2 13* 14* 13* 21* 24  GLUCOSE 141* 254* 696* 189* 163*  BUN 55* 60* 50* 47* 43*  CREATININE 3.46* 3.11* 2.05* 1.73* 1.49*  CALCIUM 7.9* 7.7* 6.7* 8.2* 8.2*  MG 1.7 1.9 2.0  --   --   PHOS  --  4.2  --   --   --     Liver Function Tests: No results for input(s): AST, ALT, ALKPHOS, BILITOT, PROT, ALBUMIN in the last 168 hours. No results for input(s): LIPASE, AMYLASE in the last 168 hours. No results for input(s): AMMONIA in the last 168 hours.  CBC:  Recent Labs Lab 04/17/15 1205 04/18/15 0504 04/19/15 0539 04/20/15 0346  04/21/15 0404  WBC 8.4 7.3 10.8* 16.0* 17.6*  NEUTROABS 7.5*  --   --   --   --   HGB 10.0* 8.7* 8.5* 9.7* 9.8*  HCT 30.3* 26.4* 25.4* 29.2* 29.8*  MCV 83.9 83.1 84.5 81.9 81.8  PLT 154 122* 136* 178 211    Cardiac Enzymes:  Recent Labs Lab 04/17/15 1205 04/17/15 1929 04/18/15 0112 04/18/15 0504 04/18/15 1833  CKTOTAL  --   --   --   --  68  CKMB  --   --   --   --  5.8*  TROPONINI <0.03 0.06* <0.03 <0.03 <0.03    BNP: Invalid input(s): POCBNP  CBG:  Recent Labs Lab 04/20/15 1137 04/20/15 1655 04/20/15 2242 04/21/15 0731 04/21/15 1136  GLUCAP 277* 146* 198* 162* 201*    Microbiology: Results for orders placed or performed during the hospital encounter of 04/17/15  Blood Culture (routine x 2)     Status: Abnormal   Collection Time: 04/17/15 12:05 PM  Result Value Ref Range Status   Specimen Description BLOOD RIGHT CHEST  Final   Special Requests BOTTLES DRAWN AEROBIC AND ANAEROBIC  3CC  Final   Culture  Setup Time   Final    GRAM NEGATIVE RODS IN BOTH AEROBIC AND ANAEROBIC BOTTLES CRITICAL RESULT CALLED TO, READ BACK BY AND VERIFIED WITH: Sheema Hallaji @ 6160 04/18/15 by  [place]    Culture (A)  Final    HAEMOPHILUS INFLUENZAE IN BOTH AEROBIC AND ANAEROBIC BOTTLES BETA LACTAMASE NEGATIVE Referred to [facility] in [place], [place] for Serotyping.    Report Status 04/20/2015 FINAL  Final  Blood Culture ID Panel (Reflexed)     Status: Abnormal   Collection Time: 04/17/15 12:05 PM  Result Value Ref Range Status   Enterococcus species NOT DETECTED NOT DETECTED Final   Vancomycin resistance NOT DETECTED NOT DETECTED Final   Listeria monocytogenes NOT DETECTED NOT DETECTED Final   Staphylococcus species NOT DETECTED NOT DETECTED Final   Staphylococcus aureus NOT DETECTED NOT DETECTED Final   Methicillin resistance NOT DETECTED NOT DETECTED Final   Streptococcus species NOT DETECTED NOT DETECTED Final   Streptococcus agalactiae NOT  DETECTED NOT DETECTED Final   Streptococcus pneumoniae NOT DETECTED NOT DETECTED Final   Streptococcus pyogenes NOT DETECTED NOT DETECTED Final   Acinetobacter baumannii NOT DETECTED NOT DETECTED Final   Enterobacteriaceae species NOT DETECTED NOT DETECTED Final   Enterobacter cloacae complex NOT DETECTED NOT DETECTED Final   Escherichia coli NOT DETECTED NOT DETECTED Final   Klebsiella oxytoca NOT DETECTED NOT DETECTED Final   Klebsiella pneumoniae NOT DETECTED NOT DETECTED Final   Proteus species NOT DETECTED NOT DETECTED Final   Serratia marcescens NOT DETECTED NOT DETECTED Final   Carbapenem resistance NOT DETECTED NOT DETECTED Final   Haemophilus influenzae DETECTED (A) NOT DETECTED Final    Comment: CRITICAL RESULT CALLED TO, READ BACK BY AND VERIFIED WITH: Sheema Hallaji @ 8101 04/18/15 by [place]    Neisseria meningitidis NOT DETECTED NOT DETECTED Final   Pseudomonas aeruginosa NOT DETECTED NOT DETECTED Final   Candida albicans NOT DETECTED NOT DETECTED Final   Candida glabrata NOT DETECTED NOT DETECTED Final   Candida krusei NOT DETECTED NOT DETECTED Final   Candida parapsilosis NOT DETECTED NOT DETECTED Final   Candida tropicalis NOT DETECTED NOT DETECTED Final  Blood Culture (routine x 2)     Status: None (Preliminary result)   Collection Time: 04/17/15  1:54 PM  Result Value Ref Range Status   Specimen Description BLOOD LEFT HAND  Final   Special Requests BOTTLES DRAWN AEROBIC AND ANAEROBIC  1CC  Final   Culture NO GROWTH 2 DAYS  Final   Report Status PENDING  Incomplete  MRSA PCR Screening     Status: None   Collection Time: 04/17/15  2:19 PM  Result Value Ref Range Status   MRSA by PCR NEGATIVE NEGATIVE Final    Comment:        The GeneXpert MRSA Assay (FDA approved for NASAL specimens only), is one component of a comprehensive MRSA colonization surveillance program. It is not intended to diagnose MRSA infection nor to guide or monitor treatment for MRSA  infections.   Urine culture     Status: None   Collection Time: 04/17/15  7:29 PM  Result Value Ref Range Status   Specimen Description URINE, RANDOM  Final   Special Requests NONE  Final   Culture NO GROWTH 1 DAY  Final   Report Status 04/19/2015 FINAL  Final    Coagulation Studies: No results for input(s): LABPROT, INR in the last 72 hours.  Urinalysis: No results for input(s): COLORURINE, LABSPEC, PHURINE, GLUCOSEU, HGBUR, BILIRUBINUR, KETONESUR, PROTEINUR, UROBILINOGEN, NITRITE, LEUKOCYTESUR in the last 72 hours.  Invalid input(s): APPERANCEUR    Imaging: Dg Chest 2 View  04/21/2015  CLINICAL DATA:  History of pneumonia, cough and chest  congestion and shortness of breath, follow-up study. EXAM: CHEST  2 VIEW COMPARISON:  Portable chest x-ray of April 19, 2015 FINDINGS: Confluent alveolar opacities have decreased in conspicuity on the left. There remains subtle increased interstitial density in the right mid lung. The cardiac silhouette remains largely obscured. The pulmonary vascularity is not engorged. 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 The Port-A-Cath appliance tip projects over the midportion of the SVC. IMPRESSION: Slight interval improvement in diffuse alveolar pneumonia in the left lung. Persistent mildly increased interstitial density in the right mid lung. Electronically Signed   By: David  [place] M.D.   On: 04/21/2015 09:24     Medications:     . amiodarone  400 mg Oral BID  . amoxicillin-clavulanate  1 tablet Oral BID  . apixaban  5 mg Oral BID  . aspirin EC  81 mg Oral Daily  . atorvastatin  20 mg Oral q1800  . diltiazem  60 mg Oral Q12H  . insulin aspart  0-20 Units Subcutaneous TID WC  . insulin aspart  0-5 Units Subcutaneous QHS  . insulin glargine  20 Units Subcutaneous Daily  . magnesium oxide  400 mg Oral Daily  . mirtazapine  15 mg Oral QHS  . mometasone-formoterol  2 puff Inhalation BID  . multivitamin-lutein  1 capsule Oral Daily  .  nystatin  5 mL Oral QID  . pantoprazole  40 mg Oral Daily  . sodium chloride flush  3 mL Intravenous Q12H  . tiotropium  18 mcg Inhalation Daily   acetaminophen **OR** acetaminophen, ALPRAZolam, chlorpheniramine-HYDROcodone, levalbuterol, metoprolol, morphine injection, ondansetron **OR** ondansetron (ZOFRAN) IV, oxyCODONE, sodium chloride flush, traMADol  Assessment/ Plan:  75 y.o. male with a PMHx of Stage III squamous cell lung cancer treated with nivolumab, coronary artery disease status post four-vessel CABG, osteoarthritis, chronic back pain, GERD, diabetes mellitus type 2, macular degeneration, and chronic kidney disease stage III who was admitted to [facility] on 04/17/2015 for evaluation of shortness of breath. Initial workup showed left-sided pneumonitis.  1. Acute renal failure/chronic kidney disease stage III Baseline creatinine 1.2 with EGFR 54. Acute renal failure most likely related to diarrhea, poor by mouth intake, and possibly Naprosyn.  - creatinine now down to 1.49.  Overall patient appears to be improving slowly.  Would recommend outpatient followup for his underlying acute renal failure.  2. Acute respiratory failure secondary to bacterial pneumonia. Haemophilus influenza growing on blood cultures. Patient transitioned to Augmentin.  3. Hyponatremia. Sodium up to 136 today.  Continue to monitor.  4. Metabolic acidosis.serum bicarbonate up to 24. Acidosis appears to have resolved.   LOS: 4 Dwan Hemmelgarn 4/12/201712:02 PM

## 2015-04-21 NOTE — Progress Notes (Signed)
PT PROFILE: 75 M with Stage IIIa Sq cell ca of lung, recent chemotherapy, acute on chronic respiratory failure due to  H flu PNA and bacteremia. Hospitalization c/b atrial flutter, AKI. Cards and nephrology following  SUBJ: Tearful. No overt respiratory distress. Family reports anxiety as well  OBJ: Filed Vitals:   04/21/15 0431 04/21/15 0452 04/21/15 0757 04/21/15 1112  BP: 157/70   133/63  Pulse: 136 99  104  Temp: 97.6 F (36.4 C)   97.9 F (36.6 C)  TempSrc: Oral   Oral  Resp: 24   18  Height:      Weight:      SpO2: 92%  89% 93%   NAD HEENT WNL JVP not well visualized L sided crackles, minimal R basilar crackles, no wheezes Tachy, IRIR, no M noted NABS, soft, NT Ext warm, no edema  BMP Latest Ref Rng 04/21/2015 04/20/2015 04/19/2015  Glucose 65 - 99 mg/dL 163(H) 189(H) 696(HH)  BUN 6 - 20 mg/dL 43(H) 47(H) 50(H)  Creatinine 0.61 - 1.24 mg/dL 1.49(H) 1.73(H) 2.05(H)  Sodium 135 - 145 mmol/L 136 134(L) 128(L)  Potassium 3.5 - 5.1 mmol/L 4.2 4.3 3.3(L)  Chloride 101 - 111 mmol/L 104 105 99(L)  CO2 22 - 32 mmol/L 24 21(L) 13(L)  Calcium 8.9 - 10.3 mg/dL 8.2(L) 8.2(L) 6.7(L)    CBC Latest Ref Rng 04/21/2015 04/20/2015 04/19/2015  WBC 3.8 - 10.6 K/uL 17.6(H) 16.0(H) 10.8(H)  Hemoglobin 13.0 - 18.0 g/dL 9.8(L) 9.7(L) 8.5(L)  Hematocrit 40.0 - 52.0 % 29.8(L) 29.2(L) 25.4(L)  Platelets 150 - 440 K/uL 211 178 136(L)    CXR: Improved aeration on L  IMPRESSION: Acute on chronic hypoxic respiratory failure Squamous cell carcinoma of lung COPD - wheezing resolved H flu PNA with bacteremia Atrial flutter > AF. Rate now controlled Acidosis, resolved Severe situational depression with anxiety  PLAN/REC: Cont [place] O2  -SpO2 goal > 90% Cont Augmentin - complete 10-14 day course total of abx as per ID recs Transition to Symbicort MDI (Dulera while in hospital) and Spiriva Cont PRN levalbuterol Mgmt of AF per Cards DC HCO3 gtt  Mgmt of DM2 per primary team Mirtazapine 15 mg  PO q HS - initiated 04/12   [name], MD PCCM service Mobile [phone] Pager [phone] 04/21/2015

## 2015-04-21 NOTE — Evaluation (Signed)
Physical Therapy Evaluation Patient Details Name: COLAN LAYMON MRN: [id number] DOB: 02-Feb-1940 Today's Date: 04/21/2015   History of Present Illness  Shonn Farruggia is a 75 y.o. 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 History limited through patient given current BiPAP treatment assisted by wife present at bedside. This is the sixth cycle received 04/14/15. Since that time has been experiencing diffuse watery diarrhea (normal side effects for him) increased fatigue, weakness, dry nonproductive cough, shortness of breath, pleuritic type chest pain. Of note recently seen by his oncologist prior to this and treated for pneumonia with Levaquin. Of chest x-ray findings slightly increased left hilar interstitial infiltrate. He also noted temperature 100.77F at home which has since resolved. Emergency department course: Acute respiratory distress on arrival unable to speak in full sentences with SaO2 mid 80s also noted to be hypotensive. He received IV fluid hydration as well as placed on BiPAP with improvement and stabilization of symptoms.   Clinical Impression  Pt demonstrates significant weakness and DOE with very limited mobility. He becomes short of breath with transfers and very limited ambulation. Pt unsteady during gait due to bilateral LE weakness and unable to ambulate further at this time. SaO2 drops to 86% on 6 L/min O2 but quickly rebounds to 91% after pursed lip breathing once returning to bed. Pt will need SNF placement at discharge but is unclear whether he will agree. Will continue to assess for discharge needs as admission continues. Pt will benefit from skilled PT services to address deficits in strength, balance, and mobility in order to return to full function at home.     Follow Up Recommendations SNF;Other (comment) (Still discussing whether they will agree to SNF)    Equipment Recommendations  None  recommended by PT    Recommendations for Other Services       Precautions / Restrictions Precautions Precautions: Fall Restrictions Weight Bearing Restrictions: No      Mobility  Bed Mobility Overal bed mobility: Needs Assistance Bed Mobility: Supine to Sit;Sit to Supine     Supine to sit: Min guard Sit to supine: Min guard   General bed mobility comments: Pt moves slowly with labored breathing. Use of bed rails and HOB elevated. Once upright able to remain sitting without assist  Transfers Overall transfer level: Needs assistance Equipment used: Rolling walker (2 wheeled) Transfers: Sit to/from Stand Sit to Stand: Min guard         General transfer comment: Pt requires increased time to come to standing. Safe hand placement demonstrated. Fair balance in standign without UE support  Ambulation/Gait Ambulation/Gait assistance: Min guard Ambulation Distance (Feet): 10 Feet Assistive device: Rolling walker (2 wheeled) Gait Pattern/deviations: Decreased step length - right;Decreased step length - left Gait velocity: Decreased Gait velocity interpretation: <1.8 ft/sec, indicative of risk for recurrent falls General Gait Details: Pt ambulates 5' forward/backward with rolling walker. Stands with forward flexed trunk. Pt very tremulous in standing and with limited walking. Ambulated on 6 L/min O2 and [place]2 drops to 86%. Quickly recovers to 91% once returned to sitting. Pt considerably fatigued with limited ambulation with prevents him from walking further.  Stairs            Wheelchair Mobility    Modified Rankin (Stroke Patients Only)       Balance Overall balance assessment: Needs assistance Sitting-balance support: No upper extremity supported Sitting balance-Leahy Scale: Good     Standing balance support: Bilateral  upper extremity supported Standing balance-Leahy Scale: Fair Standing balance comment: Able to maintain standing without UE support                              Pertinent Vitals/Pain Pain Assessment: No/denies pain    Home Living Family/patient expects to be discharged to:: Private residence Living Arrangements: Spouse/significant other Available Help at Discharge: Family Type of Home: House Home Access: Stairs to enter Entrance Stairs-Rails: None Entrance Stairs-Number of Steps: 2 Home Layout: One level Home Equipment: Environmental consultant - 2 wheels;Bedside commode;Cane - single point;Crutches;Shower seat - built in Additional Comments: Daughter lives nearby    Prior Function Level of Independence: Independent with assistive device(s)   Gait / Transfers Assistance Needed: Independent with limited community ambulation with intermittent use of walking stick  ADL's / Homemaking Assistance Needed: Independent, some assist from wife with IADLs  Comments: Intermittently wears a brace on both knees due to pain. Pt is s/p TKR R knee     Hand Dominance   Dominant Hand: Right    Extremity/Trunk Assessment   Upper Extremity Assessment: Overall WFL for tasks assessed           Lower Extremity Assessment: Overall WFL for tasks assessed         Communication   Communication: No difficulties  Cognition Arousal/Alertness: Awake/alert Behavior During Therapy: WFL for tasks assessed/performed Overall Cognitive Status: Within Functional Limits for tasks assessed                      General Comments      Exercises        Assessment/Plan    PT Assessment Patient needs continued PT services  PT Diagnosis Difficulty walking;Abnormality of gait;Generalized weakness   PT Problem List Decreased strength;Decreased activity tolerance;Decreased balance;Decreased mobility;Cardiopulmonary status limiting activity  PT Treatment Interventions DME instruction;Gait training;Functional mobility training;Therapeutic activities;Therapeutic exercise;Balance training;Neuromuscular re-education;Patient/family education    PT Goals (Current goals can be found in the Care Plan section) Acute Rehab PT Goals Patient Stated Goal: Return to prior level of function at home PT Goal Formulation: With patient/family Time For Goal Achievement: 05/05/15 Potential to Achieve Goals: Good    Frequency Min 2X/week   Barriers to discharge        Co-evaluation               End of Session Equipment Utilized During Treatment: Gait belt Activity Tolerance: Patient limited by fatigue Patient left: in bed;with call bell/phone within reach;with bed alarm set;with family/visitor present Nurse Communication: Mobility status         Time: 1410-1439 PT Time Calculation (min) (ACUTE ONLY): 29 min   Charges:   PT Evaluation $PT Eval Moderate Complexity: 1 Procedure     PT G Codes:       Lyndel Safe Blanca Carreon PT, DPT   Emiko Osorto 04/21/2015, 3:32 PM

## 2015-04-21 NOTE — Progress Notes (Signed)
Patient extremely anxious, crying and talking to wife. Notified Dr. Marcille Blanco who states he will order something to help with anxiety.

## 2015-04-22 LAB — CULTURE, BLOOD (ROUTINE X 2): CULTURE: NO GROWTH

## 2015-04-22 LAB — BASIC METABOLIC PANEL
Anion gap: 6 (ref 5–15)
BUN: 36 mg/dL — AB (ref 6–20)
CHLORIDE: 104 mmol/L (ref 101–111)
CO2: 26 mmol/L (ref 22–32)
CREATININE: 1.36 mg/dL — AB (ref 0.61–1.24)
Calcium: 8.1 mg/dL — ABNORMAL LOW (ref 8.9–10.3)
GFR, EST AFRICAN AMERICAN: 57 mL/min — AB (ref >60)
GFR, EST NON AFRICAN AMERICAN: 49 mL/min — AB (ref >60)
Glucose, Bld: 128 mg/dL — ABNORMAL HIGH (ref 65–99)
POTASSIUM: 4.2 mmol/L (ref 3.5–5.1)
SODIUM: 136 mmol/L (ref 135–145)

## 2015-04-22 LAB — GLUCOSE, CAPILLARY
GLUCOSE-CAPILLARY: 115 mg/dL — AB (ref 65–99)
GLUCOSE-CAPILLARY: 154 mg/dL — AB (ref 65–99)
Glucose-Capillary: 223 mg/dL — ABNORMAL HIGH (ref 65–99)
Glucose-Capillary: 153 mg/dL — ABNORMAL HIGH (ref 65–99)

## 2015-04-22 LAB — CBC
HCT: 28.4 % — ABNORMAL LOW (ref 40.0–52.0)
Hemoglobin: 9.4 g/dL — ABNORMAL LOW (ref 13.0–18.0)
MCH: 27.2 pg (ref 26.0–34.0)
MCHC: 33.1 g/dL (ref 32.0–36.0)
MCV: 82.3 fL (ref 80.0–100.0)
Platelets: 169 10*3/uL (ref 150–440)
RBC: 3.45 MIL/uL — AB (ref 4.40–5.90)
RDW: 18 % — AB (ref 11.5–14.5)
WBC: 11.7 10*3/uL — AB (ref 3.8–10.6)

## 2015-04-22 LAB — MAGNESIUM: Magnesium: 1.8 mg/dL (ref 1.7–2.4)

## 2015-04-22 MED ORDER — DOCUSATE SODIUM 100 MG PO CAPS
100.0000 mg | ORAL_CAPSULE | Freq: Two times a day (BID) | ORAL | Status: DC
  Administered 2015-04-22 – 2015-04-23 (×3): 100 mg via ORAL
  Filled 2015-04-22 (×3): qty 1

## 2015-04-22 MED ORDER — POLYETHYLENE GLYCOL 3350 17 G PO PACK
17.0000 g | PACK | Freq: Every day | ORAL | Status: DC
  Administered 2015-04-22 – 2015-04-23 (×2): 17 g via ORAL
  Filled 2015-04-22 (×2): qty 1

## 2015-04-22 NOTE — Progress Notes (Signed)
[place] for Apixaban dosing   Indication: atrial fibrillation  No Known Allergies   Estimated Creatinine Clearance: 57.4 mL/min (by C-G formula based on Cr of 1.36).   Assessment: 75 yo old man with stage III squamous cell lung carcinoma, PNA, bacteremia, and new onset afib.    Plan:  Will continue apixaban '5mg'$  PO BID. Counseled wife on medication today (patient sleeping), gave apixaban handout.  Rexene Edison, PharmD Clinical Pharmacist  04/22/2015,11:09 AM

## 2015-04-22 NOTE — Progress Notes (Signed)
PT PROFILE: 89 M with Stage IIIa Sq cell ca of lung, recent chemotherapy, acute on chronic respiratory failure due to  H flu PNA and bacteremia. Hospitalization c/b atrial flutter, AKI. Cards and nephrology following  SUBJ: No overt respiratory distress.  OBJDanley Danker Vitals:   04/21/15 1926 04/22/15 0423 04/22/15 0722 04/22/15 1112  BP: 121/65 114/57 106/88 138/63  Pulse: 96 98  91  Temp: 98.4 F (36.9 C) 98.2 F (36.8 C) 98.6 F (37 C) 97.7 F (36.5 C)  TempSrc: Oral Oral  Oral  Resp: '19 21 22 24  '$ Height:      Weight:      SpO2: 94% 95% 90% 91%   NAD HEENT WNL JVP not well visualized L sided crackles, minimal R basilar crackles, no wheezes Tachy, IRIR, no M noted NABS, soft, NT Ext warm, no edema  BMP Latest Ref Rng 04/22/2015 04/21/2015 04/20/2015  Glucose 65 - 99 mg/dL 128(H) 163(H) 189(H)  BUN 6 - 20 mg/dL 36(H) 43(H) 47(H)  Creatinine 0.61 - 1.24 mg/dL 1.36(H) 1.49(H) 1.73(H)  Sodium 135 - 145 mmol/L 136 136 134(L)  Potassium 3.5 - 5.1 mmol/L 4.2 4.2 4.3  Chloride 101 - 111 mmol/L 104 104 105  CO2 22 - 32 mmol/L 26 24 21(L)  Calcium 8.9 - 10.3 mg/dL 8.1(L) 8.2(L) 8.2(L)    CBC Latest Ref Rng 04/22/2015 04/21/2015 04/20/2015  WBC 3.8 - 10.6 K/uL 11.7(H) 17.6(H) 16.0(H)  Hemoglobin 13.0 - 18.0 g/dL 9.4(L) 9.8(L) 9.7(L)  Hematocrit 40.0 - 52.0 % 28.4(L) 29.8(L) 29.2(L)  Platelets 150 - 440 K/uL 169 211 178    CXR: Improved aeration on L  IMPRESSION: Acute on chronic hypoxic respiratory failure Squamous cell carcinoma of lung COPD, acute exac resolved H flu PNA with bacteremia Atrial flutter > AF. Rate now controlled Acidosis, resolved Severe situational depression with anxiety  PLAN/REC: It is my understanding that discharge to home is planned for 04/14 He should be discharged to home on Symbicort, Spiriva and PRN albuterol He will need home O2 at the time Complete 10-14 day course total of abx as per ID recs He should have follow up with Dr Raul Del in 2-3  weeks after discharge  PCCM will sign off. Please call if we can be of further assistance  [name], MD PCCM service Mobile [phone] Pager [phone] 04/22/2015

## 2015-04-22 NOTE — Discharge Instructions (Addendum)
Information on my medicine - ELIQUIS (apixaban)  This medication education was reviewed with me or my healthcare representative as part of my discharge preparation.  The pharmacist that spoke with me during my hospital stay was:  Cheri Guppy, [facility]  Why was Eliquis prescribed for you? Eliquis was prescribed for you to reduce the risk of a blood clot forming that can cause a stroke if you have a medical condition called atrial fibrillation (a type of irregular heartbeat).  What do You need to know about Eliquis ? Take your Eliquis TWICE DAILY - one tablet in the morning and one tablet in the evening with or without food. If you have difficulty swallowing the tablet whole please discuss with your pharmacist how to take the medication safely.  Take Eliquis exactly as prescribed by your doctor and DO NOT stop taking Eliquis without talking to the doctor who prescribed the medication.  Stopping may increase your risk of developing a stroke.  Refill your prescription before you run out.  After discharge, you should have regular check-up appointments with your healthcare provider that is prescribing your Eliquis.  In the future your dose may need to be changed if your kidney function or weight changes by a significant amount or as you get older.  What do you do if you miss a dose? If you miss a dose, take it as soon as you remember on the same day and resume taking twice daily.  Do not take more than one dose of ELIQUIS at the same time to make up a missed dose.  Important Safety Information A possible side effect of Eliquis is bleeding. You should call your healthcare provider right away if you experience any of the following: ? Bleeding from an injury or your nose that does not stop. ? Unusual colored urine (red or dark brown) or unusual colored stools (red or black). ? Unusual bruising for unknown reasons. ? A serious fall or if you hit your head (even if there is no bleeding).  Some  medicines may interact with Eliquis and might increase your risk of bleeding or clotting while on Eliquis. To help avoid this, consult your healthcare provider or pharmacist prior to using any new prescription or non-prescription medications, including herbals, vitamins, non-steroidal anti-inflammatory drugs (NSAIDs) and supplements.  This website has more information on Eliquis (apixaban): http://www.eliquis.com/eliquis/home  Heart healthy and ADA diet. Activity as tolerated. Home O2 [place] 2-3 L HHPT

## 2015-04-22 NOTE — Progress Notes (Signed)
[facility]  ROUNDING NOTE   Subjective:  Creatinine down to 1.39. Patient still having some cough that is productive ofsputum. Good urine output noted    Objective:  Vital signs in last 24 hours:  Temp:  [97.7 F (36.5 C)-98.6 F (37 C)] 97.7 F (36.5 C) (04/13 1112) Pulse Rate:  [91-98] 91 (04/13 1112) Resp:  [19-24] 24 (04/13 1112) BP: (106-138)/(57-88) 138/63 mmHg (04/13 1112) SpO2:  [90 %-95 %] 91 % (04/13 1112)  Weight change:  Filed Weights   04/17/15 1141 04/17/15 1419  Weight: 97.977 kg (216 lb) 99.8 kg (220 lb 0.3 oz)    Intake/Output: I/O last 3 completed shifts: In: 830.7 [P.O.:360; I.V.:470.7] Out: 4200 [Urine:4200]   Intake/Output this shift:  Total I/O In: 120 [P.O.:120] Out: 600 [Urine:600]  Physical Exam: General: No acute distress  Head: Normocephalic, atraumatic. Moist oral mucosal membranes  Eyes: Anicteric  Neck: Supple, trachea midline  Lungs:  Scattered rhonchi, normal effort, coarse b/l  Heart: S1S2 irregular  Abdomen:  Soft, nontender, BS present , distended  Extremities: no peripheral edema.  Neurologic: Nonfocal, moving all four extremities  Skin: No lesions       Basic Metabolic Panel:  Recent Labs Lab 04/17/15 1929 04/18/15 0504 04/19/15 0539 04/20/15 0346 04/21/15 0404 04/22/15 0439  NA 131* 130* 128* 134* 136 136  K 4.9 4.8 3.3* 4.3 4.2 4.2  CL 105 106 99* 105 104 104  CO2 13* 14* 13* 21* 24 26  GLUCOSE 141* 254* 696* 189* 163* 128*  BUN 55* 60* 50* 47* 43* 36*  CREATININE 3.46* 3.11* 2.05* 1.73* 1.49* 1.36*  CALCIUM 7.9* 7.7* 6.7* 8.2* 8.2* 8.1*  MG 1.7 1.9 2.0  --   --  1.8  PHOS  --  4.2  --   --   --   --     Liver Function Tests: No results for input(s): AST, ALT, ALKPHOS, BILITOT, PROT, ALBUMIN in the last 168 hours. No results for input(s): LIPASE, AMYLASE in the last 168 hours. No results for input(s): AMMONIA in the last 168 hours.  CBC:  Recent Labs Lab 04/17/15 1205 04/18/15 0504  04/19/15 0539 04/20/15 0346 04/21/15 0404 04/22/15 0439  WBC 8.4 7.3 10.8* 16.0* 17.6* 11.7*  NEUTROABS 7.5*  --   --   --   --   --   HGB 10.0* 8.7* 8.5* 9.7* 9.8* 9.4*  HCT 30.3* 26.4* 25.4* 29.2* 29.8* 28.4*  MCV 83.9 83.1 84.5 81.9 81.8 82.3  PLT 154 122* 136* 178 211 169    Cardiac Enzymes:  Recent Labs Lab 04/17/15 1205 04/17/15 1929 04/18/15 0112 04/18/15 0504 04/18/15 1833  CKTOTAL  --   --   --   --  68  CKMB  --   --   --   --  5.8*  TROPONINI <0.03 0.06* <0.03 <0.03 <0.03    BNP: Invalid input(s): POCBNP  CBG:  Recent Labs Lab 04/21/15 0731 04/21/15 1136 04/21/15 1701 04/21/15 2110 04/22/15 0730  GLUCAP 162* 201* 119* 172* 115*    Microbiology: Results for orders placed or performed during the hospital encounter of 04/17/15  Blood Culture (routine x 2)     Status: Abnormal   Collection Time: 04/17/15 12:05 PM  Result Value Ref Range Status   Specimen Description BLOOD RIGHT CHEST  Final   Special Requests BOTTLES DRAWN AEROBIC AND ANAEROBIC  3CC  Final   Culture  Setup Time   Final    GRAM NEGATIVE RODS IN  BOTH AEROBIC AND ANAEROBIC BOTTLES CRITICAL RESULT CALLED TO, READ BACK BY AND VERIFIED WITH: Sheema Hallaji @ [phone] 04/18/15 by [facility]    Culture (A)  Final    HAEMOPHILUS INFLUENZAE IN BOTH AEROBIC AND ANAEROBIC BOTTLES BETA LACTAMASE NEGATIVE Referred to [facility] in [place], [place] for Serotyping.    Report Status 04/20/2015 FINAL  Final  Blood Culture ID Panel (Reflexed)     Status: Abnormal   Collection Time: 04/17/15 12:05 PM  Result Value Ref Range Status   Enterococcus species NOT DETECTED NOT DETECTED Final   Vancomycin resistance NOT DETECTED NOT DETECTED Final   Listeria monocytogenes NOT DETECTED NOT DETECTED Final   Staphylococcus species NOT DETECTED NOT DETECTED Final   Staphylococcus aureus NOT DETECTED NOT DETECTED Final   Methicillin resistance NOT DETECTED NOT DETECTED Final   Streptococcus  species NOT DETECTED NOT DETECTED Final   Streptococcus agalactiae NOT DETECTED NOT DETECTED Final   Streptococcus pneumoniae NOT DETECTED NOT DETECTED Final   Streptococcus pyogenes NOT DETECTED NOT DETECTED Final   Acinetobacter baumannii NOT DETECTED NOT DETECTED Final   Enterobacteriaceae species NOT DETECTED NOT DETECTED Final   Enterobacter cloacae complex NOT DETECTED NOT DETECTED Final   Escherichia coli NOT DETECTED NOT DETECTED Final   Klebsiella oxytoca NOT DETECTED NOT DETECTED Final   Klebsiella pneumoniae NOT DETECTED NOT DETECTED Final   Proteus species NOT DETECTED NOT DETECTED Final   Serratia marcescens NOT DETECTED NOT DETECTED Final   Carbapenem resistance NOT DETECTED NOT DETECTED Final   Haemophilus influenzae DETECTED (A) NOT DETECTED Final    Comment: CRITICAL RESULT CALLED TO, READ BACK BY AND VERIFIED WITH: Sheema Hallaji @ [phone] 04/18/15 by [place]    Neisseria meningitidis NOT DETECTED NOT DETECTED Final   Pseudomonas aeruginosa NOT DETECTED NOT DETECTED Final   Candida albicans NOT DETECTED NOT DETECTED Final   Candida glabrata NOT DETECTED NOT DETECTED Final   Candida krusei NOT DETECTED NOT DETECTED Final   Candida parapsilosis NOT DETECTED NOT DETECTED Final   Candida tropicalis NOT DETECTED NOT DETECTED Final  Blood Culture (routine x 2)     Status: None   Collection Time: 04/17/15  1:54 PM  Result Value Ref Range Status   Specimen Description BLOOD LEFT HAND  Final   Special Requests BOTTLES DRAWN AEROBIC AND ANAEROBIC  1CC  Final   Culture NO GROWTH 5 DAYS  Final   Report Status 04/22/2015 FINAL  Final  MRSA PCR Screening     Status: None   Collection Time: 04/17/15  2:19 PM  Result Value Ref Range Status   MRSA by PCR NEGATIVE NEGATIVE Final    Comment:        The GeneXpert MRSA Assay (FDA approved for NASAL specimens only), is one component of a comprehensive MRSA colonization surveillance program. It is not intended to diagnose MRSA infection  nor to guide or monitor treatment for MRSA infections.   Urine culture     Status: None   Collection Time: 04/17/15  7:29 PM  Result Value Ref Range Status   Specimen Description URINE, RANDOM  Final   Special Requests NONE  Final   Culture NO GROWTH 1 DAY  Final   Report Status 04/19/2015 FINAL  Final    Coagulation Studies: No results for input(s): LABPROT, INR in the last 72 hours.  Urinalysis: No results for input(s): COLORURINE, LABSPEC, PHURINE, GLUCOSEU, HGBUR, BILIRUBINUR, KETONESUR, PROTEINUR, UROBILINOGEN, NITRITE, LEUKOCYTESUR in the last 72 hours.  Invalid input(s): APPERANCEUR  Imaging: Dg Chest 2 View  04/21/2015  CLINICAL DATA:  History of pneumonia, cough and chest congestion and shortness of breath, follow-up study. EXAM: CHEST  2 VIEW COMPARISON:  Portable chest x-ray of April 19, 2015 FINDINGS: Confluent alveolar opacities have decreased in conspicuity on the left. There remains subtle increased interstitial density in the right mid lung. The cardiac silhouette remains largely obscured. The pulmonary vascularity is not engorged. 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 The Port-A-Cath appliance tip projects over the midportion of the SVC. IMPRESSION: Slight interval improvement in diffuse alveolar pneumonia in the left lung. Persistent mildly increased interstitial density in the right mid lung. Electronically Signed   By: David  Martinique M.D.   On: 04/21/2015 09:24     Medications:     . amiodarone  400 mg Oral BID  . amoxicillin-clavulanate  1 tablet Oral BID  . apixaban  5 mg Oral BID  . aspirin EC  81 mg Oral Daily  . atorvastatin  20 mg Oral q1800  . diltiazem  60 mg Oral Q12H  . docusate sodium  100 mg Oral BID  . insulin aspart  0-20 Units Subcutaneous TID WC  . insulin aspart  0-5 Units Subcutaneous QHS  . insulin glargine  20 Units Subcutaneous Daily  . magnesium oxide  400 mg Oral Daily  . mirtazapine  15 mg Oral QHS  .  mometasone-formoterol  2 puff Inhalation BID  . multivitamin-lutein  1 capsule Oral Daily  . nystatin  5 mL Oral QID  . pantoprazole  40 mg Oral Daily  . polyethylene glycol  17 g Oral Daily  . sodium chloride flush  3 mL Intravenous Q12H  . tiotropium  18 mcg Inhalation Daily   acetaminophen **OR** acetaminophen, ALPRAZolam, chlorpheniramine-HYDROcodone, levalbuterol, metoprolol, morphine injection, ondansetron **OR** ondansetron (ZOFRAN) IV, oxyCODONE, sodium chloride flush, traMADol  Assessment/ Plan:  75 y.o. male with a PMHx of Stage III squamous cell lung cancer treated with nivolumab, coronary artery disease status post four-vessel CABG, osteoarthritis, chronic back pain, GERD, diabetes mellitus type 2, macular degeneration, and chronic kidney disease stage III who was admitted to [facility] on 04/17/2015 for evaluation of shortness of breath. Initial workup showed left-sided pneumonitis.  1. Acute renal failure/chronic kidney disease stage III Baseline creatinine 1.2 with EGFR 54. Acute renal failure most likely related to diarrhea, poor by mouth intake, and possibly naprosyn and nivolumab - creatinine now down to 1.39.  Overall patient appears to be improving slowly.  Would recommend outpatient followup for his underlying acute renal failure.  2. Acute respiratory failure secondary to bacterial pneumonia. Haemophilus influenza growing on blood cultures. Patient transitioned to Augmentin.  3. Hyponatremia. Sodium up to 136 today.  Continue to monitor.     LOS: 5 Elton Heid 4/13/201712:12 PM

## 2015-04-22 NOTE — Care Management Note (Signed)
Case Management Note  Patient Details  Name: Ivan Beasley MRN: [id number] Date of Birth: December 07, 1940  Subjective/Objective:   Blood culture positive for Influenzae. Met with wife at bedside. Patient sleeping. She is refusing SNF and patient has told attending that he did not want SNF. Agreeable to home health. prefers [facility] after agency choices presented. [facility]erral to [facility] for SN and PT. May need home O2. Currently on 6L due to coughing, wheezing and shortness of breath. This is an increase from 2 L. Following progression                  Action/Plan:   Expected Discharge Date:                  Expected Discharge Plan:  [place]  In-House Referral:     Discharge planning Services  CM Consult  Post Acute Care Choice:  Home Health Choice offered to:  Spouse  DME Arranged:    DME Agency:     HH Arranged:  RN, PT [place] Agency:  [facility]  Status of Service:  In process, will continue to follow  Medicare Important Message Given:  Yes Date Medicare IM Given:    Medicare IM give by:    Date Additional Medicare IM Given:    Additional Medicare Important Message give by:     If discussed at [place] of Stay Meetings, dates discussed:    Additional Comments:  Jolly Mango, RN 04/22/2015, 11:10 AM

## 2015-04-22 NOTE — Progress Notes (Addendum)
[facility] [place] NAME: Ivan Beasley    MR#:  [id number]  DATE OF BIRTH:  1940/07/27  SUBJECTIVE:  CHIEF COMPLAINT:   Chief Complaint  Patient presents with  . Shortness of Breath   Has cough, wheezing and SOB early this am, increased O2 [place] to 6 L  He is anxious. REVIEW OF SYSTEMS:  CONSTITUTIONAL: No fever, has weakness.  EYES: No blurred or double vision.  EARS, NOSE, AND THROAT: No tinnitus or ear pain.  RESPIRATORY: has cough, shortness of breath and wheezing, no  hemoptysis.  CARDIOVASCULAR: No chest pain, orthopnea, edema.  GASTROINTESTINAL: No nausea, vomiting, or abdominal pain, no diarrhea. GENITOURINARY: No dysuria, hematuria.  ENDOCRINE: No polyuria, nocturia,  HEMATOLOGY: No anemia, easy bruising or bleeding SKIN: No rash or lesion. MUSCULOSKELETAL: No joint pain or arthritis.   NEUROLOGIC: No tingling, numbness, weakness.  PSYCHIATRY: has anxiety but no depression.   DRUG ALLERGIES:  No Known Allergies  VITALS:  Blood pressure 106/88, pulse 98, temperature 98.6 F (37 C), temperature source Oral, resp. rate 22, height 6' (1.829 m), weight 99.8 kg (220 lb 0.3 oz), SpO2 90 %.  PHYSICAL EXAMINATION:  GENERAL:  75 y.o.-year-old patient lying in the bed with no acute distress.  EYES: Pupils equal, round, reactive to light and accommodation. No scleral icterus. Extraocular muscles intact.  HEENT: Head atraumatic, normocephalic. Oropharynx and nasopharynx clear.  NECK:  Supple, no jugular venous distention. No thyroid enlargement, no tenderness.  LUNGS: Normal breath sounds on right side, no wheezing or rhonchi, but has mild crackles on left side. No use of accessory muscles of respiration.  CARDIOVASCULAR: S1, S2 normal. No murmurs, rubs, or gallops.  ABDOMEN: Soft, nontender, nondistended. Bowel sounds present. No organomegaly or mass.  EXTREMITIES: No pedal edema, cyanosis, or clubbing.  NEUROLOGIC: Cranial  nerves II through XII are intact. Muscle strength 4/5 in all extremities. Sensation intact. Gait not checked.  PSYCHIATRIC: The patient is alert and oriented x 3.  SKIN: No obvious rash, lesion, or ulcer.    LABORATORY PANEL:   CBC  Recent Labs Lab 04/22/15 0439  WBC 11.7*  HGB 9.4*  HCT 28.4*  PLT 169   ------------------------------------------------------------------------------------------------------------------  Chemistries   Recent Labs Lab 04/22/15 0439  NA 136  K 4.2  CL 104  CO2 26  GLUCOSE 128*  BUN 36*  CREATININE 1.36*  CALCIUM 8.1*  MG 1.8   ------------------------------------------------------------------------------------------------------------------  Cardiac Enzymes  Recent Labs Lab 04/18/15 1833  TROPONINI <0.03   ------------------------------------------------------------------------------------------------------------------  RADIOLOGY:  Dg Chest 2 View  04/21/2015  CLINICAL DATA:  History of pneumonia, cough and chest congestion and shortness of breath, follow-up study. EXAM: CHEST  2 VIEW COMPARISON:  Portable chest x-ray of April 19, 2015 FINDINGS: Confluent alveolar opacities have decreased in conspicuity on the left. There remains subtle increased interstitial density in the right mid lung. The cardiac silhouette remains largely obscured. The pulmonary vascularity is not engorged. 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 The Port-A-Cath appliance tip projects over the midportion of the SVC. IMPRESSION: Slight interval improvement in diffuse alveolar pneumonia in the left lung. Persistent mildly increased interstitial density in the right mid lung. Electronically Signed   By: David  Martinique M.D.   On: 04/21/2015 09:24    EKG:   Orders placed or performed during the hospital encounter of 04/17/15  . EKG 12-Lead  . EKG 12-Lead  . EKG 12-Lead  . EKG 12-Lead  . EKG  12-Lead  . 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 Jeb Levering presenting in respiratory distress.  1. Acute on chronic respiratory failure with hypoxia:  Weaned off BiPAP to a high flow nasal cannula, changed to O2 [place].  Try to wean down O2 [place]. Continue  xopenex q3h prn, continue dulera and spiriva.  * Pneumonia and bacteremia with sepsis. left pneumonitis reaction secondary to nivolumab.  Blood culture: Haemophilus influenzae.  Treated with IV steroids 10 mg Decadron every 8 hours for 24 hours.  Repeated CXR: Slight interval improvement in diffuse alveolar pneumonia in the left lung. Persistent mildly increased interstitial density in the right mid lung. He was treated with vancomycin and unasyn, changed to augmentin. Complete 10 more days of augmentin (3/10) per Dr. Ola Spurr.  2. Type 2 diabetes non-insulin-requiring.  hold oral agents, continue lantus and  insulin sliding scale.  3. Acute renal failure/chronic kidney disease stage III Baseline creatinine 1.2 with EGFR 54. improving with IV fluid, f/u BMP, renal ultrasound: no obstruction.  4. Stage III squamous cell carcinoma lung. F/u oncologist.   * Afib with RVR. Rated controlled. He was treated with cardizem iv one dose, but hypotensive, put on amiodarone drip. changed to po and added cardizem po.  He was on ASA and heparin drip. Echo: EF 45-50%.  Per Dr. Clayborn Bigness, may change to eliquis and do not recommend invasive cardiac catheter at this stage. I discussed with the pt and his wife about benefit and side effect of bleeding of eliquis and started eliquis PTD.  * Hyponatremia. Improved with NS iv. * Hypokalemia. Improved with KCl.  anxiety. Xanax prn and remeron.  PT suggested SNF. All the records are reviewed and case discussed with Care Management/Social Workerr. Management plans discussed with the patient, his wife and they are in agreement.  CODE STATUS: full code.  TOTAL CRITICAL TIME  TAKING CARE OF THIS PATIENT: 42 minutes.  Greater than 50% time was spent on coordination of care and face-to-face counseling.  POSSIBLE D/C IN 2-3 DAYS, DEPENDING ON CLINICAL CONDITION.   Demetrios Loll M.D on 04/22/2015 at 10:48 AM  Between 7am to 6pm - Pager - [phone]  After 6pm go to [email or web address] - password [email or web address] [place] [facility]  Office  [phone]  CC: Primary care physician; Albina Billet, MD

## 2015-04-23 LAB — GLUCOSE, CAPILLARY
GLUCOSE-CAPILLARY: 174 mg/dL — AB (ref 65–99)
GLUCOSE-CAPILLARY: 266 mg/dL — AB (ref 65–99)

## 2015-04-23 MED ORDER — TIOTROPIUM BROMIDE MONOHYDRATE 18 MCG IN CAPS: 18.0000 ug | ORAL_CAPSULE | Freq: Every day | RESPIRATORY_TRACT | Status: AC

## 2015-04-23 MED ORDER — DILTIAZEM HCL ER 60 MG PO CP12
60.0000 mg | ORAL_CAPSULE | Freq: Two times a day (BID) | ORAL | Status: DC
Start: 2015-04-23 — End: 2015-06-13

## 2015-04-23 MED ORDER — ALBUTEROL SULFATE (2.5 MG/3ML) 0.083% IN NEBU: 2.5000 mg | INHALATION_SOLUTION | Freq: Four times a day (QID) | RESPIRATORY_TRACT | Status: AC | PRN

## 2015-04-23 MED ORDER — APIXABAN 5 MG PO TABS
5.0000 mg | ORAL_TABLET | Freq: Two times a day (BID) | ORAL | Status: AC
Start: 2015-04-23 — End: ?

## 2015-04-23 MED ORDER — HEPARIN SOD (PORK) LOCK FLUSH 100 UNIT/ML IV SOLN
500.0000 [IU] | INTRAVENOUS | Status: AC | PRN
Start: 2015-04-23 — End: 2015-04-23
  Administered 2015-04-23: 500 [IU]

## 2015-04-23 MED ORDER — ALPRAZOLAM 0.25 MG PO TABS: 0.2500 mg | ORAL_TABLET | Freq: Three times a day (TID) | ORAL | Status: AC | PRN

## 2015-04-23 MED ORDER — AMOXICILLIN-POT CLAVULANATE 875-125 MG PO TABS: 1.0000 | ORAL_TABLET | Freq: Two times a day (BID) | ORAL | Status: DC

## 2015-04-23 MED ORDER — MIRTAZAPINE 15 MG PO TABS: 15.0000 mg | ORAL_TABLET | Freq: Every day | ORAL | Status: DC

## 2015-04-23 MED ORDER — AMIODARONE HCL 200 MG PO TABS: 200.0000 mg | ORAL_TABLET | Freq: Every day | ORAL | Status: AC

## 2015-04-23 NOTE — Discharge Summary (Signed)
[facility] [place] NAME: Ivan Beasley    MR#:  [id number]  DATE OF BIRTH:  02-12-1940  DATE OF ADMISSION:  04/17/2015 ADMITTING PHYSICIAN: [name], MD  DATE OF DISCHARGE: 04/23/2015  2:12 PM  PRIMARY CARE PHYSICIAN: Albina Billet, MD    ADMISSION DIAGNOSIS:  Respiratory failure ([place]) [J96.90] HCAP (healthcare-associated pneumonia) [J18.9] Chemotherapy adverse reaction, initial encounter [T45.1X5A] Acute renal failure, unspecified acute renal failure type ([place]) [N17.9]   DISCHARGE DIAGNOSIS:  Acute on chronic respiratory failure with hypoxia due to PNA left pneumonitis reaction secondary to nivolumab Pneumonia and bacteremia with sepsis Afib with RVR.  Acute renal failure/chronic kidney disease stage III  SECONDARY DIAGNOSIS:   Past Medical History  Diagnosis Date  . Coronary artery disease   . Pneumonia 2000  . Arthritis   . 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 Bruises easily   . GERD (gastroesophageal reflux disease)     takes Omeprazole daily as needed for stomach pain  . Blood transfusion 2001  . Diabetes mellitus     takes Metformin daily;  . Impaired hearing     bil hearing aide  . Macular degeneration     being watched for this but hasn't been "completely" diagnosed  . Myocardial infarction ([place]) 2001  . Cancer [facility])     HOSPITAL COURSE:   75 year old Caucasian gentleman known stage III squamous cell lung carcinoma on Nivolumab with Dr. Jeb Levering presenting in respiratory distress.  1. Acute on chronic respiratory failure with hypoxia due to PNA:  Weaned off BiPAP to a high flow nasal cannula, changed to O2 [place]. Tried to wean down O2 [place] to 3 L today. Hypoxia without O2 [place]. He was treated with xopenex, dulera and spiriva.  * Pneumonia and bacteremia with sepsis. left pneumonitis reaction secondary to nivolumab.  Blood culture: Haemophilus influenzae.  Treated with IV steroids 10 mg  Decadron every 8 hours for 24 hours.  Repeated CXR: Slight interval improvement in diffuse alveolar pneumonia in the left lung. Persistent mildly increased interstitial density in the right mid lung. He was treated with vancomycin and unasyn, changed to augmentin. Complete 10 more days of augmentin (3/10) per Dr. Ola Spurr.  2. Type 2 diabetes non-insulin-requiring. hold oral agents, treated with lantus and insulin sliding scale.  3. Acute renal failure/chronic kidney disease stage III Baseline creatinine 1.2 with EGFR 54. improved with IV fluid, renal ultrasound: no obstruction.  4. Stage III squamous cell carcinoma lung. F/u oncologist as outpatient.   * Afib with RVR. Rated controlled. He was treated with cardizem iv one dose, but hypotensive, put on amiodarone drip. changed to po and added cardizem po. He was on ASA and heparin drip. Echo: EF 45-50%. Per Dr. Clayborn Bigness, may change to eliquis and do not recommend invasive cardiac catheter at this stage. I discussed with the pt and his wife about benefit and side effect of bleeding of eliquis and started eliquis PTD. On eliquis 5 mg po bid.  * Hyponatremia. Improved with NS iv. * Hypokalemia. Improved with KCl. anxiety. Xanax prn and remeron.  PT suggested SNF. But he and his wife want to be discharged to home with HHPT.  DISCHARGE CONDITIONS:   Stable, discharge to home with HHPT today.  CONSULTS OBTAINED:  Treatment Team:  [name], MD Lloyd Huger, MD Munsoor Holley Raring, MD Leonel Ramsay, MD Yolonda Kida, MD  DRUG ALLERGIES:  No Known  Allergies  DISCHARGE MEDICATIONS:   Discharge Medication List as of 04/23/2015  1:30 PM    START taking these medications   Details  albuterol (PROVENTIL) (2.5 MG/3ML) 0.083% nebulizer solution Take 3 mLs (2.5 mg total) by nebulization every 6 (six) hours as needed for wheezing or shortness of breath., Starting 04/23/2015, Until Discontinued, Print    ALPRAZolam  (XANAX) 0.25 MG tablet Take 1 tablet (0.25 mg total) by mouth 3 (three) times daily as needed for anxiety., Starting 04/23/2015, Until Discontinued, Print    amiodarone (PACERONE) 200 MG tablet Take 1 tablet (200 mg total) by mouth daily., Starting 04/23/2015, Until Discontinued, Print    amoxicillin-clavulanate (AUGMENTIN) 875-125 MG tablet Take 1 tablet by mouth 2 (two) times daily., Starting 04/23/2015, Until Discontinued, Print    apixaban (ELIQUIS) 5 MG TABS tablet Take 1 tablet (5 mg total) by mouth 2 (two) times daily., Starting 04/23/2015, Until Discontinued, Print    diltiazem (CARDIZEM SR) 60 MG 12 hr capsule Take 1 capsule (60 mg total) by mouth every 12 (twelve) hours., Starting 04/23/2015, Until Discontinued, Print    mirtazapine (REMERON) 15 MG tablet Take 1 tablet (15 mg total) by mouth at bedtime., Starting 04/23/2015, Until Discontinued, Print    tiotropium (SPIRIVA) 18 MCG inhalation capsule Place 1 capsule (18 mcg total) into inhaler and inhale daily., Starting 04/23/2015, Until Discontinued, Print      CONTINUE these medications which have NOT CHANGED   Details  aspirin 81 MG tablet Take 81 mg by mouth at bedtime. , Starting 04/01/2014, Until Discontinued, Historical Med    benzonatate (TESSALON) 100 MG capsule TAKE 1 CAPSULE (100 MG TOTAL) BY MOUTH 3 (THREE) TIMES DAILY AS NEEDED FOR COUGH., Normal    beta carotene w/minerals (OCUVITE) tablet Take 1 tablet by mouth 2 (two) times daily., Until Discontinued, Historical Med    bismuth subsalicylate (KAOPECTATE) 262 MG/15ML suspension Take 30 mLs by mouth 3 (three) times daily as needed., Starting 10/28/2014, Until Discontinued, Normal    Blood Glucose Calibration (TRUETEST CONTROL LEVEL 1) LIQD Reported on 04/17/2015, Starting 11/10/2013, Until Discontinued, Historical Med    chlorpheniramine-HYDROcodone (TUSSIONEX PENNKINETIC ER) 10-8 MG/5ML SUER Take 5 mLs by mouth every 12 (twelve) hours as needed for cough., Starting 01/12/2015,  Until Discontinued, Print    diphenoxylate-atropine (LOMOTIL) 2.5-0.025 MG tablet TAKE 1 TABLET EVERY 4 HOURS AS NEEDED FOR DIARRHEA /LOOSE STOOL, Phone In    glimepiride (AMARYL) 1 MG tablet Starting 02/06/2014, Until Discontinued, Historical Med    glucose blood test strip Historical Med    guaiFENesin (MUCINEX) 600 MG 12 hr tablet Take 1 tablet (600 mg total) by mouth 2 (two) times daily., Starting 03/09/2015, Until Discontinued, Normal    Lancet Devices (TRUEDRAW LANCING DEVICE) MISC Starting 11/10/2013, Until Discontinued, Historical Med    lidocaine-prilocaine (EMLA) cream Starting 05/21/2014, Until Discontinued, Historical Med    magnesium oxide (MAG-OX) 400 MG tablet Take 400 mg by mouth daily.  , Until Discontinued, Historical Med    Melatonin 5 MG CAPS Take 1 capsule (5 mg total) by mouth at bedtime as needed., Starting 06/03/2014, Until Discontinued, No Print    metFORMIN (GLUCOPHAGE) 500 MG tablet Take 500 mg by mouth daily. Take 1046m po in morning, take 5010mpo at bedtime, Until Discontinued, Historical Med    omeprazole (PRILOSEC) 40 MG capsule Take 40 mg by mouth daily. For acid reflux , Until Discontinued, Historical Med    ondansetron (ZOFRAN) 8 MG tablet Take 1 tablet (8 mg total) by mouth 2 (  two) times daily. Start the day after chemo for 3 days. Then take as needed for nausea or vomiting., Starting 05/21/2014, Until Discontinued, Normal    oxyCODONE (ROXICODONE) 5 MG immediate release tablet Take 1 tablet (5 mg total) by mouth every 6 (six) hours as needed for severe pain., Starting 10/14/2014, Until Discontinued, Print    simvastatin (ZOCOR) 40 MG tablet Take 40 mg by mouth at bedtime.  , Until Discontinued, Historical Med    SYMBICORT 160-4.5 MCG/ACT inhaler 2 (two) times daily. , Starting 04/21/2014, Until Discontinued, Historical Med    traMADol (ULTRAM) 50 MG tablet 50 mg every 6 (six) hours as needed. , Starting 03/30/2015, Until Discontinued, Historical Med       STOP taking these medications     albuterol (PROVENTIL HFA;VENTOLIN HFA) 108 (90 BASE) MCG/ACT inhaler      fluconazole (DIFLUCAN) 100 MG tablet      metoprolol tartrate (LOPRESSOR) 25 MG tablet      naproxen (NAPROSYN) 500 MG tablet          DISCHARGE INSTRUCTIONS:   If you experience worsening of your admission symptoms, develop shortness of breath, life threatening emergency, suicidal or homicidal thoughts you must seek medical attention immediately by calling 911 or calling your MD immediately  if symptoms less severe.  You Must read complete instructions/literature along with all the possible adverse reactions/side effects for all the Medicines you take and that have been prescribed to you. Take any new Medicines after you have completely understood and accept all the possible adverse reactions/side effects.   Please note  You were cared for by a hospitalist during your hospital stay. If you have any questions about your discharge medications or the care you received while you were in the hospital after you are discharged, you can call the unit and asked to speak with the hospitalist on call if the hospitalist that took care of you is not available. Once you are discharged, your primary care physician will handle any further medical issues. Please note that NO REFILLS for any discharge medications will be authorized once you are discharged, as it is imperative that you return to your primary care physician (or establish a relationship with a primary care physician if you do not have one) for your aftercare needs so that they can reassess your need for medications and monitor your lab values.    Today   SUBJECTIVE    Feels better. Weaned O2 [place] to 3 L.  VITAL SIGNS:  Blood pressure 132/54, pulse 88, temperature 97.5 F (36.4 C), temperature source Oral, resp. rate 16, height 6' (1.829 m), weight 96.571 kg (212 lb 14.4 oz), SpO2 91 %.  I/O:   Intake/Output Summary (Last 24  hours) at 04/23/15 1553 Last data filed at 04/23/15 1348  Gross per 24 hour  Intake    360 ml  Output   1850 ml  Net  -1490 ml    PHYSICAL EXAMINATION:  GENERAL:  75 y.o.-year-old patient lying in the bed with no acute distress. Obese. EYES: Pupils equal, round, reactive to light and accommodation. No scleral icterus. Extraocular muscles intact.  HEENT: Head atraumatic, normocephalic. Oropharynx and nasopharynx clear.  NECK:  Supple, no jugular venous distention. No thyroid enlargement, no tenderness.  LUNGS: Normal breath sounds bilaterally, no wheezing, mild crackles on left base. No use of accessory muscles of respiration.  CARDIOVASCULAR: S1, S2 normal. No murmurs, rubs, or gallops.  ABDOMEN: Soft, non-tender, non-distended. Bowel sounds present. No organomegaly or mass.  EXTREMITIES: No pedal edema, cyanosis, or clubbing.  NEUROLOGIC: Cranial nerves II through XII are intact. Muscle strength 4/5 in all extremities. Sensation intact. Gait not checked.  PSYCHIATRIC: The patient is alert and oriented x 3.  SKIN: No obvious rash, lesion, or ulcer.   DATA REVIEW:   CBC  Recent Labs Lab 04/22/15 0439  WBC 11.7*  HGB 9.4*  HCT 28.4*  PLT 169    Chemistries   Recent Labs Lab 04/22/15 0439  NA 136  K 4.2  CL 104  CO2 26  GLUCOSE 128*  BUN 36*  CREATININE 1.36*  CALCIUM 8.1*  MG 1.8    Cardiac Enzymes  Recent Labs Lab 04/18/15 1833  TROPONINI <0.03    Microbiology Results  Results for orders placed or performed during the hospital encounter of 04/17/15  Blood Culture (routine x 2)     Status: Abnormal   Collection Time: 04/17/15 12:05 PM  Result Value Ref Range Status   Specimen Description BLOOD RIGHT CHEST  Final   Special Requests BOTTLES DRAWN AEROBIC AND ANAEROBIC  3CC  Final   Culture  Setup Time   Final    GRAM NEGATIVE RODS IN BOTH AEROBIC AND ANAEROBIC BOTTLES CRITICAL RESULT CALLED TO, READ BACK BY AND VERIFIED WITH: Sheema Hallaji @ [id number]  04/18/15 by [facility]    Culture (A)  Final    HAEMOPHILUS INFLUENZAE IN BOTH AEROBIC AND ANAEROBIC BOTTLES BETA LACTAMASE NEGATIVE Referred to [facility] in [place], [place] for Serotyping.    Report Status 04/20/2015 FINAL  Final  Blood Culture ID Panel (Reflexed)     Status: Abnormal   Collection Time: 04/17/15 12:05 PM  Result Value Ref Range Status   Enterococcus species NOT DETECTED NOT DETECTED Final   Vancomycin resistance NOT DETECTED NOT DETECTED Final   Listeria monocytogenes NOT DETECTED NOT DETECTED Final   Staphylococcus species NOT DETECTED NOT DETECTED Final   Staphylococcus aureus NOT DETECTED NOT DETECTED Final   Methicillin resistance NOT DETECTED NOT DETECTED Final   Streptococcus species NOT DETECTED NOT DETECTED Final   Streptococcus agalactiae NOT DETECTED NOT DETECTED Final   Streptococcus pneumoniae NOT DETECTED NOT DETECTED Final   Streptococcus pyogenes NOT DETECTED NOT DETECTED Final   Acinetobacter baumannii NOT DETECTED NOT DETECTED Final   Enterobacteriaceae species NOT DETECTED NOT DETECTED Final   Enterobacter cloacae complex NOT DETECTED NOT DETECTED Final   Escherichia coli NOT DETECTED NOT DETECTED Final   Klebsiella oxytoca NOT DETECTED NOT DETECTED Final   Klebsiella pneumoniae NOT DETECTED NOT DETECTED Final   Proteus species NOT DETECTED NOT DETECTED Final   Serratia marcescens NOT DETECTED NOT DETECTED Final   Carbapenem resistance NOT DETECTED NOT DETECTED Final   Haemophilus influenzae DETECTED (A) NOT DETECTED Final    Comment: CRITICAL RESULT CALLED TO, READ BACK BY AND VERIFIED WITH: Sheema Hallaji @ 3536 04/18/15 by [place]    Neisseria meningitidis NOT DETECTED NOT DETECTED Final   Pseudomonas aeruginosa NOT DETECTED NOT DETECTED Final   Candida albicans NOT DETECTED NOT DETECTED Final   Candida glabrata NOT DETECTED NOT DETECTED Final   Candida krusei NOT DETECTED NOT DETECTED Final   Candida parapsilosis NOT  DETECTED NOT DETECTED Final   Candida tropicalis NOT DETECTED NOT DETECTED Final  Blood Culture (routine x 2)     Status: None   Collection Time: 04/17/15  1:54 PM  Result Value Ref Range Status   Specimen Description BLOOD LEFT HAND  Final   Special Requests BOTTLES  DRAWN AEROBIC AND ANAEROBIC  1CC  Final   Culture NO GROWTH 5 DAYS  Final   Report Status 04/22/2015 FINAL  Final  MRSA PCR Screening     Status: None   Collection Time: 04/17/15  2:19 PM  Result Value Ref Range Status   MRSA by PCR NEGATIVE NEGATIVE Final    Comment:        The GeneXpert MRSA Assay (FDA approved for NASAL specimens only), is one component of a comprehensive MRSA colonization surveillance program. It is not intended to diagnose MRSA infection nor to guide or monitor treatment for MRSA infections.   Urine culture     Status: None   Collection Time: 04/17/15  7:29 PM  Result Value Ref Range Status   Specimen Description URINE, RANDOM  Final   Special Requests NONE  Final   Culture NO GROWTH 1 DAY  Final   Report Status 04/19/2015 FINAL  Final    RADIOLOGY:  No results found.      Management plans discussed with the patient, his wife and they are in agreement.  CODE STATUS:     Code Status Orders        Start     Ordered   04/17/15 1318  Full code   Continuous     04/17/15 1318    Code Status History    Date Active Date Inactive Code Status Order ID Comments User Context   This patient has a current code status but no historical code status.    Advance Directive Documentation        Most Recent Value   Type of Advance Directive  Living will   Pre-existing out of facility DNR order (yellow form or pink MOST form)     "MOST" Form in Place?        TOTAL TIME TAKING CARE OF THIS PATIENT: 41 minutes.    Demetrios Loll M.D on 04/23/2015 at 3:53 PM  Between 7am to 6pm - Pager - [phone]  After 6pm go to [email or web address] - password [email or web address] [place] [facility]  Office   [phone]  CC: Primary care physician; Albina Billet, MD

## 2015-04-23 NOTE — Progress Notes (Signed)
Pt discharge  Instructions and prescriptions given to  And reviewed with patient and patient's wife.  Both verbalized  Understanding.  IV removed per policy and procedure.  Pt NOT on telemetry.  Pt in NAD, skin warm and dry, denies pain at this time.  Pt discharged home. Home nebulizer and O2 sent with patient.

## 2015-04-23 NOTE — Care Management Important Message (Signed)
Important Message  Patient Details  Name: Ivan Beasley MRN: [id number] Date of Birth: [birth year]   Medicare Important Message Given:  Yes    Juliann Pulse A Durk Carmen 04/23/2015, 10:10 AM

## 2015-04-23 NOTE — Progress Notes (Addendum)
Patient stated the leads bothered him, so he took them off. Patient has refused to continue wearing telemetry monitor and wife requested him not to be on it as well. Notified CCMD to put patient in standby mode. Port in right chest was also deaccessed per patient, but site is clean, dry, and intact. Nursing staff will continue to monitor. Earleen Reaper, RN

## 2015-04-23 NOTE — Care Management (Signed)
Notified [facility] of discharge. Ordered home O2 and nebulizer from [facility]. Wife updated and agrees with POC.

## 2015-04-23 NOTE — Progress Notes (Signed)
SATURATION QUALIFICATIONS: (This note is used to comply with regulatory documentation for home oxygen)  Patient Saturations on Room Air at Rest = 85%  Patient Saturations on Room Air while Ambulating = 83%  Patient Saturations on 3 Liters of oxygen while Ambulating = 91%  Please briefly explain why patient needs home oxygen: Shortness of breath. Pneumonia. Lung cancer

## 2015-04-26 ENCOUNTER — Ambulatory Visit: Payer: Medicare HMO

## 2015-04-28 ENCOUNTER — Ambulatory Visit: Payer: Medicare HMO

## 2015-04-28 ENCOUNTER — Ambulatory Visit: Payer: Medicare HMO | Admitting: Oncology

## 2015-04-28 ENCOUNTER — Other Ambulatory Visit: Payer: Medicare HMO

## 2015-05-03 ENCOUNTER — Ambulatory Visit
Admission: RE | Admit: 2015-05-03 | Discharge: 2015-05-03 | Disposition: A | Discharge status: Home / Self Care | Payer: Medicare HMO | Source: Ambulatory Visit | Attending: Oncology | Admitting: Oncology

## 2015-05-03 DIAGNOSIS — C3402 Malignant neoplasm of left main bronchus: Secondary | ICD-10-CM | POA: Diagnosis present

## 2015-05-03 DIAGNOSIS — R590 Localized enlarged lymph nodes: Secondary | ICD-10-CM | POA: Insufficient documentation

## 2015-05-03 DIAGNOSIS — K8020 Calculus of gallbladder without cholecystitis without obstruction: Secondary | ICD-10-CM | POA: Insufficient documentation

## 2015-05-03 DIAGNOSIS — J90 Pleural effusion, not elsewhere classified: Secondary | ICD-10-CM | POA: Diagnosis not present

## 2015-05-03 LAB — GLUCOSE, CAPILLARY: GLUCOSE-CAPILLARY: 101 mg/dL — AB (ref 65–99)

## 2015-05-03 MED ORDER — FLUDEOXYGLUCOSE F - 18 (FDG) INJECTION
12.6800 | Freq: Once | INTRAVENOUS | Status: AC | PRN
  Administered 2015-05-03: 12.68 via INTRAVENOUS

## 2015-05-05 ENCOUNTER — Inpatient Hospital Stay (HOSPITAL_BASED_OUTPATIENT_CLINIC_OR_DEPARTMENT_OTHER): Payer: Medicare HMO | Admitting: Oncology

## 2015-05-05 ENCOUNTER — Inpatient Hospital Stay: Payer: Medicare HMO

## 2015-05-05 ENCOUNTER — Encounter: Payer: Self-pay | Admitting: Oncology

## 2015-05-05 VITALS — BP 85/58 | HR 85 | Temp 95.6°F | Resp 18 | Wt 211.0 lb

## 2015-05-05 DIAGNOSIS — Z923 Personal history of irradiation: Secondary | ICD-10-CM | POA: Diagnosis not present

## 2015-05-05 DIAGNOSIS — R590 Localized enlarged lymph nodes: Secondary | ICD-10-CM

## 2015-05-05 DIAGNOSIS — F329 Major depressive disorder, single episode, unspecified: Secondary | ICD-10-CM | POA: Diagnosis not present

## 2015-05-05 DIAGNOSIS — R531 Weakness: Secondary | ICD-10-CM

## 2015-05-05 DIAGNOSIS — Z5112 Encounter for antineoplastic immunotherapy: Secondary | ICD-10-CM | POA: Diagnosis not present

## 2015-05-05 DIAGNOSIS — G8929 Other chronic pain: Secondary | ICD-10-CM | POA: Diagnosis not present

## 2015-05-05 DIAGNOSIS — I2510 Atherosclerotic heart disease of native coronary artery without angina pectoris: Secondary | ICD-10-CM

## 2015-05-05 DIAGNOSIS — R5383 Other fatigue: Secondary | ICD-10-CM | POA: Diagnosis not present

## 2015-05-05 DIAGNOSIS — R05 Cough: Secondary | ICD-10-CM

## 2015-05-05 DIAGNOSIS — Z951 Presence of aortocoronary bypass graft: Secondary | ICD-10-CM | POA: Diagnosis not present

## 2015-05-05 DIAGNOSIS — C3412 Malignant neoplasm of upper lobe, left bronchus or lung: Secondary | ICD-10-CM | POA: Diagnosis not present

## 2015-05-05 DIAGNOSIS — Z87891 Personal history of nicotine dependence: Secondary | ICD-10-CM | POA: Diagnosis not present

## 2015-05-05 DIAGNOSIS — E119 Type 2 diabetes mellitus without complications: Secondary | ICD-10-CM | POA: Diagnosis not present

## 2015-05-05 DIAGNOSIS — Z7984 Long term (current) use of oral hypoglycemic drugs: Secondary | ICD-10-CM | POA: Diagnosis not present

## 2015-05-05 DIAGNOSIS — K219 Gastro-esophageal reflux disease without esophagitis: Secondary | ICD-10-CM | POA: Diagnosis not present

## 2015-05-05 DIAGNOSIS — Z7982 Long term (current) use of aspirin: Secondary | ICD-10-CM | POA: Diagnosis not present

## 2015-05-05 DIAGNOSIS — Z9221 Personal history of antineoplastic chemotherapy: Secondary | ICD-10-CM | POA: Diagnosis not present

## 2015-05-05 DIAGNOSIS — Z79899 Other long term (current) drug therapy: Secondary | ICD-10-CM | POA: Diagnosis not present

## 2015-05-05 DIAGNOSIS — R509 Fever, unspecified: Secondary | ICD-10-CM

## 2015-05-05 DIAGNOSIS — M549 Dorsalgia, unspecified: Secondary | ICD-10-CM | POA: Diagnosis not present

## 2015-05-05 DIAGNOSIS — M1990 Unspecified osteoarthritis, unspecified site: Secondary | ICD-10-CM | POA: Diagnosis not present

## 2015-05-05 DIAGNOSIS — C3402 Malignant neoplasm of left main bronchus: Secondary | ICD-10-CM

## 2015-05-05 DIAGNOSIS — I252 Old myocardial infarction: Secondary | ICD-10-CM | POA: Diagnosis not present

## 2015-05-05 DIAGNOSIS — Z7901 Long term (current) use of anticoagulants: Secondary | ICD-10-CM | POA: Diagnosis not present

## 2015-05-05 DIAGNOSIS — C3490 Malignant neoplasm of unspecified part of unspecified bronchus or lung: Secondary | ICD-10-CM

## 2015-05-05 LAB — COMPREHENSIVE METABOLIC PANEL
ALK PHOS: 92 U/L (ref 38–126)
ALT: 20 U/L (ref 17–63)
ANION GAP: 11 (ref 5–15)
AST: 26 U/L (ref 15–41)
Albumin: 3 g/dL — ABNORMAL LOW (ref 3.5–5.0)
BILIRUBIN TOTAL: 0.5 mg/dL (ref 0.3–1.2)
BUN: 26 mg/dL — ABNORMAL HIGH (ref 6–20)
CALCIUM: 9.4 mg/dL (ref 8.9–10.3)
CO2: 21 mmol/L — ABNORMAL LOW (ref 22–32)
Chloride: 105 mmol/L (ref 101–111)
Creatinine, Ser: 1.48 mg/dL — ABNORMAL HIGH (ref 0.61–1.24)
GFR calc non Af Amer: 45 mL/min — ABNORMAL LOW (ref >60)
GFR, EST AFRICAN AMERICAN: 52 mL/min — AB (ref >60)
Glucose, Bld: 166 mg/dL — ABNORMAL HIGH (ref 65–99)
Potassium: 4.5 mmol/L (ref 3.5–5.1)
Sodium: 137 mmol/L (ref 135–145)
TOTAL PROTEIN: 7.3 g/dL (ref 6.5–8.1)

## 2015-05-05 LAB — CBC WITH DIFFERENTIAL/PLATELET
Basophils Absolute: 0 10*3/uL (ref 0–0.1)
Basophils Relative: 1 %
EOS ABS: 0.2 10*3/uL (ref 0–0.7)
Eosinophils Relative: 4 %
HEMATOCRIT: 24.2 % — AB (ref 40.0–52.0)
HEMOGLOBIN: 8.1 g/dL — AB (ref 13.0–18.0)
Lymphocytes Relative: 10 %
Lymphs Abs: 0.5 10*3/uL — ABNORMAL LOW (ref 1.0–3.6)
MCH: 28.2 pg (ref 26.0–34.0)
MCHC: 33.5 g/dL (ref 32.0–36.0)
MCV: 84.2 fL (ref 80.0–100.0)
MONO ABS: 0.5 10*3/uL (ref 0.2–1.0)
MONOS PCT: 11 %
NEUTROS ABS: 3.9 10*3/uL (ref 1.4–6.5)
NEUTROS PCT: 74 %
Platelets: 281 10*3/uL (ref 150–440)
RBC: 2.87 MIL/uL — ABNORMAL LOW (ref 4.40–5.90)
RDW: 18.2 % — AB (ref 11.5–14.5)
WBC: 5.2 10*3/uL (ref 3.8–10.6)

## 2015-05-05 MED ORDER — SODIUM CHLORIDE 0.9% FLUSH
10.0000 mL | INTRAVENOUS | Status: AC | PRN
  Administered 2015-05-05: 10 mL via INTRAVENOUS
  Filled 2015-05-05: qty 10

## 2015-05-05 MED ORDER — PREDNISONE 5 MG PO TABS: ORAL_TABLET | ORAL | Status: DC

## 2015-05-05 MED ORDER — HEPARIN SOD (PORK) LOCK FLUSH 100 UNIT/ML IV SOLN
500.0000 [IU] | Freq: Once | INTRAVENOUS | Status: AC
  Administered 2015-05-05: 500 [IU] via INTRAVENOUS
  Filled 2015-05-05: qty 5

## 2015-05-05 NOTE — Progress Notes (Signed)
Patient here today as hospital followup.  Patient here for PET results.

## 2015-05-05 NOTE — Progress Notes (Signed)
[place] @ [facility] Telephone:(336) [phone]  Fax:(336) [phone]     Ivan Beasley OB: [birth year]  MR#: [id number]  XJO#:[id number]  Patient Care Team: Albina Billet, MD as PCP - General (Internal Medicine)  CHIEF COMPLAINT:  No chief complaint on file.  Oncology History   75 year old gentleman with left hilar mass and mediastinal adenopathy Chronic smoker quit smoking in 2000 Non-insulin-dependent diabetes History of coronary artery disease with status post bypass surgery done in year 2000 1.  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  Patient is starting carboplatinum and Taxol with AUC of 2 and 45 mg/m respectively concurrent with radiation therapy (May 27, 2014) 3.  He has finished total 6 cycles of carboplatinum and Taxol weekly chemotherapy on July 02, 2014    4.  Patient had a PET scan done which shows partial response.  (September, 2016 Patient was start consolidation therapy from September 2 016 Day 8 of  treatment of second cycle has been discontinued because of progressive side effect in October of 2016. 5.  CT scan revealed slight enlargement of the left hilar nodule as well as gastrohepatic ligament adenopathy new from the April of 2016 (December, 2016)  6.  Patient has been started on   nivolulamab from February 03, 2015  INTERVAL HISTORY: Patient had myelosuppression and low-grade fever.  All the cultures were done and patient was started on IV antibiotics.  Feeling somewhat stronger.   Patient continues to feel weak and tired.  Has cough.  Yellowish expectoration.  Low-grade fever off and on.  Patient has noticed some swelling around the port site.  Somewhat depressed here for further follow-up regarding carcinoma of lung Patient was admitted in the hospital with increasing shortness of breath.  X-ray revealed the left pulmonary infiltrate.  Was treated for pneumonia as well as  suspected NIVOLULMAB induced pneumonitis.  Patient is here for ongoing evaluation and according to him he is getting somewhat better.  He is on oxygen at present time.  In wheelchair.  According to him his condition has improved since last evaluation ROS  general status: Patient is feeling weak and tired.  No change in a performance status.  No chills.  No fever. HEENT: .  No evidence of stomatitis Lungs: Dry hacking cough with yellowish expectoration and low-grade fever he was treated with IV antibiotics and general condition has improved Patient has noticed some swelling in the right chest wall area around the port site Cardiac: No chest pain or paroxysmal nocturnal dyspnea GI: Complains of diarrhea which is resolved Skin: No rash Lower extremity no swelling Neurological system: No tingling.  No numbness.  No other focal signs Musculoskeletal system no bony pains  As per HPI. Otherwise, a complete review of systems is negatve.  PAST MEDICAL HISTORY: Past Medical History  Diagnosis Date  . Coronary artery disease   . Pneumonia 2000  . Arthritis   . 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 Bruises easily   . GERD (gastroesophageal reflux disease)     takes Omeprazole daily as needed for stomach pain  . Blood transfusion 2001  . Diabetes mellitus     takes Metformin daily;  . Impaired hearing     bil hearing aide  . Macular degeneration     being watched for this but hasn't been "completely" diagnosed  .  Myocardial infarction ([place]) 2001  . Cancer [facility])     PAST SURGICAL HISTORY: Past Surgical History  Procedure Laterality Date  . 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 Cardiac catheterization  2001  . Tonsillectomy      as a child  . Colonoscopy    . Cataract extraction      bilateral  . Lumbar laminectomy/decompression microdiscectomy  12/19/2010    Procedure: LUMBAR LAMINECTOMY/DECOMPRESSION MICRODISCECTOMY;  Surgeon: Elaina Hoops;  Location: [place]  NEURO ORS;  Service: Neurosurgery;  Laterality: N/A;  Lumbar three-four, four-five decompressive lumbar laminectomy  . Joint replacement  right tkr  . Eye surgery      cataracts  . Back surgery      l4 5  . Portacath placement N/A 05/20/2014    Procedure: INSERTION PORT-A-CATH;  Surgeon: Nestor Lewandowsky, MD;  Location: [facility] ORS;  Service: General;  Laterality: N/A;    FAMILY HISTORY Family History  Problem Relation Age of Onset  . Anesthesia problems Neg Hx   . Hypotension Neg Hx   . Malignant hyperthermia Neg Hx   . Pseudochol deficiency Neg Hx   . Diabetes type II Other         ADVANCED DIRECTIVES: Patient does have advanced health care directive   HEALTH MAINTENANCE: Social History  Substance Use Topics  . Smoking status: Former Smoker -- 40 years    Types: Cigarettes    Quit date: 05/19/1999  . Smokeless tobacco: Never Used     Comment: 2001  . Alcohol Use: No     No Known Allergies  Current Outpatient Prescriptions  Medication Sig Dispense Refill  . albuterol (PROVENTIL) (2.5 MG/3ML) 0.083% nebulizer solution Take 3 mLs (2.5 mg total) by nebulization every 6 (six) hours as needed for wheezing or shortness of breath. 75 mL 2  . ALPRAZolam (XANAX) 0.25 MG tablet Take 1 tablet (0.25 mg total) by mouth 3 (three) times daily as needed for anxiety. 20 tablet 0  . amiodarone (PACERONE) 200 MG tablet Take 1 tablet (200 mg total) by mouth daily. 30 tablet 0  . amoxicillin-clavulanate (AUGMENTIN) 875-125 MG tablet Take 1 tablet by mouth 2 (two) times daily. 14 tablet 0  . apixaban (ELIQUIS) 5 MG TABS tablet Take 1 tablet (5 mg total) by mouth 2 (two) times daily. 60 tablet 0  . aspirin 81 MG tablet Take 81 mg by mouth at bedtime.     . benzonatate (TESSALON) 100 MG capsule TAKE 1 CAPSULE (100 MG TOTAL) BY MOUTH 3 (THREE) TIMES DAILY AS NEEDED FOR COUGH. 90 capsule 3  . beta carotene w/minerals (OCUVITE) tablet Take 1 tablet by mouth 2 (two) times daily.    Marland Kitchen bismuth  subsalicylate (KAOPECTATE) 262 MG/15ML suspension Take 30 mLs by mouth 3 (three) times daily as needed. 360 mL 0  . Blood Glucose Calibration (TRUETEST CONTROL LEVEL 1) LIQD Reported on 04/17/2015    . chlorpheniramine-HYDROcodone (TUSSIONEX PENNKINETIC ER) 10-8 MG/5ML SUER Take 5 mLs by mouth every 12 (twelve) hours as needed for cough. 140 mL 0  . diltiazem (CARDIZEM SR) 60 MG 12 hr capsule Take 1 capsule (60 mg total) by mouth every 12 (twelve) hours. 60 capsule 0  . diphenoxylate-atropine (LOMOTIL) 2.5-0.025 MG tablet TAKE 1 TABLET EVERY 4 HOURS AS NEEDED FOR DIARRHEA /LOOSE STOOL 45 tablet 1  . glimepiride (AMARYL) 1 MG tablet     . glucose blood test strip     . guaiFENesin (MUCINEX) 600 MG  12 hr tablet Take 1 tablet (600 mg total) by mouth 2 (two) times daily. 60 tablet 2  . Lancet Devices (TRUEDRAW LANCING DEVICE) MISC     . lidocaine-prilocaine (EMLA) cream     . magnesium oxide (MAG-OX) 400 MG tablet Take 400 mg by mouth daily.      . Melatonin 5 MG CAPS Take 1 capsule (5 mg total) by mouth at bedtime as needed.  0  . metFORMIN (GLUCOPHAGE) 500 MG tablet Take 500 mg by mouth daily. Take '1000mg'$  po in morning, take '500mg'$  po at bedtime    . mirtazapine (REMERON) 15 MG tablet Take 1 tablet (15 mg total) by mouth at bedtime. 30 tablet 0  . omeprazole (PRILOSEC) 40 MG capsule Take 40 mg by mouth daily. For acid reflux     . ondansetron (ZOFRAN) 8 MG tablet Take 1 tablet (8 mg total) by mouth 2 (two) times daily. Start the day after chemo for 3 days. Then take as needed for nausea or vomiting. 30 tablet 1  . oxyCODONE (ROXICODONE) 5 MG immediate release tablet Take 1 tablet (5 mg total) by mouth every 6 (six) hours as needed for severe pain. 30 tablet 0  . simvastatin (ZOCOR) 40 MG tablet Take 40 mg by mouth at bedtime.      . SYMBICORT 160-4.5 MCG/ACT inhaler 2 (two) times daily.   12  . tiotropium (SPIRIVA) 18 MCG inhalation capsule Place 1 capsule (18 mcg total) into inhaler and inhale daily.  30 capsule 0  . traMADol (ULTRAM) 50 MG tablet 50 mg every 6 (six) hours as needed.      No current facility-administered medications for this visit.   Facility-Administered Medications Ordered in Other Visits  Medication Dose Route Frequency Provider Last Rate Last Dose  . 0.9 %  sodium chloride infusion   Intravenous Continuous Forest Gleason, MD   Stopped at 12/11/14 1034  . heparin lock flush 100 unit/mL  500 Units Intravenous Once Forest Gleason, MD      . lidocaine-prilocaine (EMLA) cream   Topical Once Evlyn Kanner, NP      . sodium chloride 0.9 % injection 10 mL  10 mL Intracatheter PRN Forest Gleason, MD   10 mL at 06/03/14 1106  . sodium chloride 0.9 % injection 10 mL  10 mL Intravenous PRN Forest Gleason, MD   10 mL at 07/30/14 0920  . sodium chloride 0.9 % injection 10 mL  10 mL Intracatheter PRN Forest Gleason, MD   10 mL at 12/11/14 0912  . sodium chloride flush (NS) 0.9 % injection 10 mL  10 mL Intravenous PRN Forest Gleason, MD   10 mL at 05/05/15 0848    OBJECTIVE: There were no vitals filed for this visit.   There is no weight on file to calculate BMI.    ECOG FS:1 - Symptomatic but completely ambulatory  Physical Exam  Gen. status: Patient is somewhat depressed.  Alert oriented not in any acute distress.  Chest wall area swelling and puffiness around the right upper lobe chest wall area without any redness or tenderness.  It appears that catheter entering into internal jugular vein has been somewhat tight Lungs: Diminished air entry on both sides occasional rhonchi.  Cardiac:  Tachycardia Abdominal exam revealed normal bowel sounds. The abdomen was soft, non-tender, and without masses, organomegaly, or appreciable enlargement of the abdominal aorta.  psychiatric systems somewhat depressed.   Neurologically, the patient was awake, alert, and oriented to person, place and time.  There were no obvious focal neurologic abnormalities.  musculoskeletal system within normal limit    Rash around the left lower extremity around the ankle appears to be unrelated to chemotherapy as it seemed a small area and note generalized rash  all other   12  system is been examined.     CBC Latest Ref Rng 04/22/2015 04/21/2015 04/20/2015  WBC 3.8 - 10.6 K/uL 11.7(H) 17.6(H) 16.0(H)  Hemoglobin 13.0 - 18.0 g/dL 9.4(L) 9.8(L) 9.7(L)  Hematocrit 40.0 - 52.0 % 28.4(L) 29.8(L) 29.2(L)  Platelets 150 - 440 K/uL 169 211 178    IMPRESSION: IMPRESSION: 1. Hypermetabolic left supraclavicular and upper abdominal adenopathy adenopathy, worrisome for metastatic disease 2. Mild residual hypermetabolism within small to borderline enlarged mediastinal lymph nodes. 3. Presumed to radiation changes in the left lung. Given presence of patchy ground-glass in the right lung, multilobar pneumonia is not excluded. 4. Slight decrease in size of an omental nodule which does not show abnormal hypermetabolism. 5. Small left pleural effusion. 6. Cholelithiasis.   ASSESSMENT: \ 1.  Carcinoma of lung left upper lobe.  Stage IIIa squamous cell carcinoma.  I General condition is improved with NIVOLULAMAB will continue that at present time 2.  Bilateral pulmonary infiltrates secondary to infection versus pneumonitis due to [facility] PET scan has been reviewed independently Patient was hospitalized at [facility] records were reviewed Possibility of multilobar pneumonia versus pneumonitis secondary to newly [place]  Enlarged lymph node may suggest progressing disease versus D2 inflammation Patient has been taken off NIVOLULMAB Started on prednisone tapering dose Reevaluate patient in 2 weeks Reviewed PET scan independently as well as reviewed with the patient    Staging form: Lung, AJCC 7th Edition     Clinical: T2, N2, M0 - Marni Griffon, MD   05/05/2015 9:01 AM

## 2015-05-10 ENCOUNTER — Encounter: Payer: Self-pay | Admitting: Oncology

## 2015-05-13 DIAGNOSIS — J189 Pneumonia, unspecified organism: Secondary | ICD-10-CM | POA: Insufficient documentation

## 2015-05-13 DIAGNOSIS — I480 Paroxysmal atrial fibrillation: Secondary | ICD-10-CM | POA: Insufficient documentation

## 2015-05-14 ENCOUNTER — Telehealth: Payer: Self-pay | Admitting: *Deleted

## 2015-05-14 DIAGNOSIS — C3402 Malignant neoplasm of left main bronchus: Secondary | ICD-10-CM

## 2015-05-14 MED ORDER — FLUCONAZOLE 100 MG PO TABS: 100.0000 mg | ORAL_TABLET | Freq: Every day | ORAL | Status: AC

## 2015-05-14 NOTE — Telephone Encounter (Signed)
He has been on heavy doses of Prednisone and has oral thrush again, Asking for fluconazole rx to be refilled. Escribed

## 2015-05-19 LAB — CULTURE, BLOOD (ROUTINE X 2)

## 2015-05-26 ENCOUNTER — Ambulatory Visit
Admission: RE | Admit: 2015-05-26 | Discharge: 2015-05-26 | Disposition: A | Discharge status: Home / Self Care | Payer: Medicare HMO | Source: Ambulatory Visit | Attending: Oncology | Admitting: Oncology

## 2015-05-27 ENCOUNTER — Inpatient Hospital Stay: Payer: Medicare HMO | Attending: Oncology

## 2015-05-27 ENCOUNTER — Ambulatory Visit
Admission: RE | Admit: 2015-05-27 | Discharge: 2015-05-27 | Disposition: A | Discharge status: Home / Self Care | Payer: Medicare HMO | Source: Ambulatory Visit | Attending: Oncology | Admitting: Oncology

## 2015-05-27 ENCOUNTER — Inpatient Hospital Stay (HOSPITAL_BASED_OUTPATIENT_CLINIC_OR_DEPARTMENT_OTHER): Payer: Medicare HMO | Admitting: Oncology

## 2015-05-27 VITALS — BP 113/71 | HR 89 | Temp 97.8°F | Resp 18 | Wt 213.4 lb

## 2015-05-27 DIAGNOSIS — M1990 Unspecified osteoarthritis, unspecified site: Secondary | ICD-10-CM | POA: Insufficient documentation

## 2015-05-27 DIAGNOSIS — Z7901 Long term (current) use of anticoagulants: Secondary | ICD-10-CM | POA: Diagnosis not present

## 2015-05-27 DIAGNOSIS — C3490 Malignant neoplasm of unspecified part of unspecified bronchus or lung: Secondary | ICD-10-CM

## 2015-05-27 DIAGNOSIS — Z7984 Long term (current) use of oral hypoglycemic drugs: Secondary | ICD-10-CM | POA: Insufficient documentation

## 2015-05-27 DIAGNOSIS — J189 Pneumonia, unspecified organism: Secondary | ICD-10-CM

## 2015-05-27 DIAGNOSIS — C3412 Malignant neoplasm of upper lobe, left bronchus or lung: Secondary | ICD-10-CM

## 2015-05-27 DIAGNOSIS — M549 Dorsalgia, unspecified: Secondary | ICD-10-CM | POA: Insufficient documentation

## 2015-05-27 DIAGNOSIS — Z951 Presence of aortocoronary bypass graft: Secondary | ICD-10-CM | POA: Diagnosis not present

## 2015-05-27 DIAGNOSIS — E119 Type 2 diabetes mellitus without complications: Secondary | ICD-10-CM | POA: Insufficient documentation

## 2015-05-27 DIAGNOSIS — R590 Localized enlarged lymph nodes: Secondary | ICD-10-CM

## 2015-05-27 DIAGNOSIS — I252 Old myocardial infarction: Secondary | ICD-10-CM | POA: Insufficient documentation

## 2015-05-27 DIAGNOSIS — D759 Disease of blood and blood-forming organs, unspecified: Secondary | ICD-10-CM

## 2015-05-27 DIAGNOSIS — I2510 Atherosclerotic heart disease of native coronary artery without angina pectoris: Secondary | ICD-10-CM | POA: Diagnosis not present

## 2015-05-27 DIAGNOSIS — R531 Weakness: Secondary | ICD-10-CM

## 2015-05-27 DIAGNOSIS — Z79899 Other long term (current) drug therapy: Secondary | ICD-10-CM | POA: Diagnosis not present

## 2015-05-27 DIAGNOSIS — G8929 Other chronic pain: Secondary | ICD-10-CM | POA: Diagnosis not present

## 2015-05-27 DIAGNOSIS — Z9221 Personal history of antineoplastic chemotherapy: Secondary | ICD-10-CM

## 2015-05-27 DIAGNOSIS — Z87891 Personal history of nicotine dependence: Secondary | ICD-10-CM

## 2015-05-27 DIAGNOSIS — R5383 Other fatigue: Secondary | ICD-10-CM

## 2015-05-27 DIAGNOSIS — J90 Pleural effusion, not elsewhere classified: Secondary | ICD-10-CM

## 2015-05-27 DIAGNOSIS — K8020 Calculus of gallbladder without cholecystitis without obstruction: Secondary | ICD-10-CM | POA: Diagnosis not present

## 2015-05-27 DIAGNOSIS — Z923 Personal history of irradiation: Secondary | ICD-10-CM | POA: Diagnosis not present

## 2015-05-27 DIAGNOSIS — Z7952 Long term (current) use of systemic steroids: Secondary | ICD-10-CM | POA: Diagnosis not present

## 2015-05-27 DIAGNOSIS — K219 Gastro-esophageal reflux disease without esophagitis: Secondary | ICD-10-CM | POA: Insufficient documentation

## 2015-05-27 DIAGNOSIS — R509 Fever, unspecified: Secondary | ICD-10-CM

## 2015-05-27 LAB — COMPREHENSIVE METABOLIC PANEL
ALT: 20 U/L (ref 17–63)
ANION GAP: 8 (ref 5–15)
AST: 20 U/L (ref 15–41)
Albumin: 3.9 g/dL (ref 3.5–5.0)
Alkaline Phosphatase: 105 U/L (ref 38–126)
BUN: 24 mg/dL — ABNORMAL HIGH (ref 6–20)
CHLORIDE: 105 mmol/L (ref 101–111)
CO2: 22 mmol/L (ref 22–32)
Calcium: 9.5 mg/dL (ref 8.9–10.3)
Creatinine, Ser: 1.66 mg/dL — ABNORMAL HIGH (ref 0.61–1.24)
GFR calc non Af Amer: 39 mL/min — ABNORMAL LOW (ref >60)
GFR, EST AFRICAN AMERICAN: 45 mL/min — AB (ref >60)
Glucose, Bld: 117 mg/dL — ABNORMAL HIGH (ref 65–99)
POTASSIUM: 5.6 mmol/L — AB (ref 3.5–5.1)
SODIUM: 135 mmol/L (ref 135–145)
Total Bilirubin: 0.4 mg/dL (ref 0.3–1.2)
Total Protein: 7.9 g/dL (ref 6.5–8.1)

## 2015-05-27 LAB — CBC WITH DIFFERENTIAL/PLATELET
Basophils Absolute: 0 10*3/uL (ref 0–0.1)
Basophils Relative: 1 %
EOS ABS: 0.1 10*3/uL (ref 0–0.7)
EOS PCT: 2 %
HCT: 27.9 % — ABNORMAL LOW (ref 40.0–52.0)
Hemoglobin: 9.1 g/dL — ABNORMAL LOW (ref 13.0–18.0)
LYMPHS ABS: 0.6 10*3/uL — AB (ref 1.0–3.6)
Lymphocytes Relative: 9 %
MCH: 28.2 pg (ref 26.0–34.0)
MCHC: 32.5 g/dL (ref 32.0–36.0)
MCV: 86.8 fL (ref 80.0–100.0)
MONOS PCT: 10 %
Monocytes Absolute: 0.6 10*3/uL (ref 0.2–1.0)
Neutro Abs: 5.1 10*3/uL (ref 1.4–6.5)
Neutrophils Relative %: 78 %
PLATELETS: 220 10*3/uL (ref 150–440)
RBC: 3.21 MIL/uL — AB (ref 4.40–5.90)
RDW: 20.5 % — ABNORMAL HIGH (ref 11.5–14.5)
WBC: 6.5 10*3/uL (ref 3.8–10.6)

## 2015-05-27 NOTE — Progress Notes (Signed)
Patient here today as hospital follow up regarding Nivo reaction and pneumonia.  States he is much better today.  Patient states his legs get tired and weak.

## 2015-05-30 ENCOUNTER — Encounter: Payer: Self-pay | Admitting: Oncology

## 2015-05-30 NOTE — Progress Notes (Signed)
[place] @ [facility] Telephone:(336) [phone]  Fax:(336) [place]: 1940/10/03  MR#: [id number]  HUD#:[id number]  Patient Care Team: Albina Billet, MD as PCP - General (Internal Medicine)  CHIEF COMPLAINT:  Chief Complaint  Patient presents with  . Lung Cancer   Oncology History   75 year old gentleman with left hilar mass and mediastinal adenopathy Chronic smoker quit smoking in 2000 Non-insulin-dependent diabetes History of coronary artery disease with status post bypass surgery done in year 2000 1.  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  Patient is starting carboplatinum and Taxol with AUC of 2 and 45 mg/m respectively concurrent with radiation therapy (May 27, 2014) 3.  He has finished total 6 cycles of carboplatinum and Taxol weekly chemotherapy on July 02, 2014    4.  Patient had a PET scan done which shows partial response.  (September, 2016 Patient was start consolidation therapy from September 2 016 Day 8 of  treatment of second cycle has been discontinued because of progressive side effect in October of 2016. 5.  CT scan revealed slight enlargement of the left hilar nodule as well as gastrohepatic ligament adenopathy new from the April of 2016 (December, 2016)  6.  Patient has been started on   nivolulamab from February 03, 2015  INTERVAL HISTORY: Patient had myelosuppression and low-grade fever.  All the cultures were done and patient was started on IV antibiotics.  Feeling somewhat stronger.   Patient continues to feel weak and tired.  Has cough.  Yellowish expectoration.  Low-grade fever off and on.  Patient has noticed some swelling around the port site.  Somewhat depressed here for further follow-up regarding carcinoma of lung Patient was admitted in the hospital with increasing shortness of breath.  X-ray revealed the left pulmonary infiltrate.  Was treated for  pneumonia as well as suspected NIVOLULMAB induced pneumonitis.  Patient is here for ongoing evaluation and according to him he is getting somewhat better.  He is on oxygen at present time.  In wheelchair.  According to him his condition has improved since last evaluation.  Patient is here for ongoing evaluation and treatment consideration.  Shortness of breath has improved.  Chest x-ray was done which revealed significant improvement. She has been tapered off steroids at present time on oxygen making some progress in ambulation. ROS  general status: Patient's general condition has somewhat improved since last evaluation.  No change in a performance status.  No chills.  No fever. HEENT: .  No evidence of stomatitis Lungs: Shortness of breath and cough is improved he was treated with IV antibiotics and general condition has improved Patient has noticed some swelling in the right chest wall area around the port site Cardiac: No chest pain or paroxysmal nocturnal dyspnea GI: Complains of diarrhea which is resolved Skin: No rash Lower extremity no swelling Neurological system: No tingling.  No numbness.  No other focal signs Musculoskeletal system no bony pains  As per HPI. Otherwise, a complete review of systems is negatve.  PAST MEDICAL HISTORY: Past Medical History  Diagnosis Date  . Coronary artery disease   . Pneumonia 2000  . Arthritis   . 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 Bruises easily   . GERD (gastroesophageal reflux disease)     takes Omeprazole daily as needed for  stomach pain  . Blood transfusion 2001  . Diabetes mellitus     takes Metformin daily;  . Impaired hearing     bil hearing aide  . Macular degeneration     being watched for this but hasn't been "completely" diagnosed  . Myocardial infarction ([place]) 2001  . Cancer [facility])     PAST SURGICAL HISTORY: Past Surgical History  Procedure Laterality Date  . Coronary artery bypass graft  2001    4  vessels  . Cardiac catheterization  2001  . Tonsillectomy      as a child  . Colonoscopy    . Cataract extraction      bilateral  . Lumbar laminectomy/decompression microdiscectomy  12/19/2010    Procedure: LUMBAR LAMINECTOMY/DECOMPRESSION MICRODISCECTOMY;  Surgeon: Elaina Hoops;  Location: [place] NEURO ORS;  Service: Neurosurgery;  Laterality: N/A;  Lumbar three-four, four-five decompressive lumbar laminectomy  . Joint replacement  right tkr  . Eye surgery      cataracts  . Back surgery      l4 5  . Portacath placement N/A 05/20/2014    Procedure: INSERTION PORT-A-CATH;  Surgeon: Nestor Lewandowsky, MD;  Location: [facility] ORS;  Service: General;  Laterality: N/A;    FAMILY HISTORY Family History  Problem Relation Age of Onset  . Anesthesia problems Neg Hx   . Hypotension Neg Hx   . Malignant hyperthermia Neg Hx   . Pseudochol deficiency Neg Hx   . Diabetes type II Other         ADVANCED DIRECTIVES: Patient does have advanced health care directive   HEALTH MAINTENANCE: Social History  Substance Use Topics  . Smoking status: Former Smoker -- 40 years    Types: Cigarettes    Quit date: 05/19/1999  . Smokeless tobacco: Never Used     Comment: 2001  . Alcohol Use: No     No Known Allergies  Current Outpatient Prescriptions  Medication Sig Dispense Refill  . albuterol (PROVENTIL) (2.5 MG/3ML) 0.083% nebulizer solution Take 3 mLs (2.5 mg total) by nebulization every 6 (six) hours as needed for wheezing or shortness of breath. 75 mL 2  . ALPRAZolam (XANAX) 0.25 MG tablet Take 1 tablet (0.25 mg total) by mouth 3 (three) times daily as needed for anxiety. 20 tablet 0  . amiodarone (PACERONE) 200 MG tablet Take 1 tablet (200 mg total) by mouth daily. 30 tablet 0  . apixaban (ELIQUIS) 5 MG TABS tablet Take 1 tablet (5 mg total) by mouth 2 (two) times daily. 60 tablet 0  . benzonatate (TESSALON) 100 MG capsule TAKE 1 CAPSULE (100 MG TOTAL) BY MOUTH 3 (THREE) TIMES DAILY AS NEEDED FOR  COUGH. 90 capsule 3  . bismuth subsalicylate (KAOPECTATE) 262 MG/15ML suspension Take 30 mLs by mouth 3 (three) times daily as needed. 360 mL 0  . Blood Glucose Calibration (TRUETEST CONTROL LEVEL 1) LIQD Reported on 04/17/2015    . diltiazem (CARDIZEM SR) 60 MG 12 hr capsule Take 1 capsule (60 mg total) by mouth every 12 (twelve) hours. 60 capsule 0  . diphenoxylate-atropine (LOMOTIL) 2.5-0.025 MG tablet TAKE 1 TABLET EVERY 4 HOURS AS NEEDED FOR DIARRHEA /LOOSE STOOL 45 tablet 1  . glimepiride (AMARYL) 1 MG tablet     . glucose blood test strip     . Lancet Devices (TRUEDRAW LANCING DEVICE) MISC     . lidocaine-prilocaine (EMLA) cream     . magnesium oxide (MAG-OX) 400 MG tablet Take 400 mg by mouth daily.      Marland Kitchen  Melatonin 5 MG CAPS Take 1 capsule (5 mg total) by mouth at bedtime as needed.  0  . metFORMIN (GLUCOPHAGE) 500 MG tablet Take 500 mg by mouth daily. Take '1000mg'$  po in morning, take '500mg'$  po at bedtime    . omeprazole (PRILOSEC) 40 MG capsule Take 40 mg by mouth daily. For acid reflux     . oxyCODONE (ROXICODONE) 5 MG immediate release tablet Take 1 tablet (5 mg total) by mouth every 6 (six) hours as needed for severe pain. 30 tablet 0  . SYMBICORT 160-4.5 MCG/ACT inhaler 2 (two) times daily.   12  . tiotropium (SPIRIVA) 18 MCG inhalation capsule Place 1 capsule (18 mcg total) into inhaler and inhale daily. 30 capsule 0  . traMADol (ULTRAM) 50 MG tablet 50 mg every 6 (six) hours as needed.     . predniSONE (DELTASONE) 5 MG tablet START '60MG'$  TAPER BY '5MG'$  DAILY TO 0  0   No current facility-administered medications for this visit.   Facility-Administered Medications Ordered in Other Visits  Medication Dose Route Frequency Provider Last Rate Last Dose  . 0.9 %  sodium chloride infusion   Intravenous Continuous Forest Gleason, MD   Stopped at 12/11/14 1034  . lidocaine-prilocaine (EMLA) cream   Topical Once Evlyn Kanner, NP      . sodium chloride 0.9 % injection 10 mL  10 mL  Intracatheter PRN Forest Gleason, MD   10 mL at 06/03/14 1106  . sodium chloride 0.9 % injection 10 mL  10 mL Intravenous PRN Forest Gleason, MD   10 mL at 07/30/14 0920  . sodium chloride 0.9 % injection 10 mL  10 mL Intracatheter PRN Forest Gleason, MD   10 mL at 12/11/14 0912  . sodium chloride flush (NS) 0.9 % injection 10 mL  10 mL Intravenous PRN Forest Gleason, MD   10 mL at 05/05/15 0848    OBJECTIVE: Filed Vitals:   05/27/15 1631  BP: 113/71  Pulse: 89  Temp: 97.8 F (36.6 C)  Resp: 18     Body mass index is 28.93 kg/(m^2).    ECOG FS:1 - Symptomatic but completely ambulatory  Physical Exam  Gen. status: .  Alert oriented not in any acute distress.  Chest wall area swelling and puffiness around the right upper lobe chest wall area without any redness or tenderness.  It appears that catheter entering into internal jugular vein has been somewhat tight Lungs: Diminished air entry on both sides occasional rhonchi.  Cardiac:  Tachycardia Abdominal exam revealed normal bowel sounds. The abdomen was soft, non-tender, and without masses, organomegaly, or appreciable enlargement of the abdominal aorta.  psychiatric systems somewhat depressed.   Neurologically, the patient was awake, alert, and oriented to person, place and time. There were no obvious focal neurologic abnormalities.  musculoskeletal system within normal limit   Rash around the left lower extremity around the ankle appears to be unrelated to chemotherapy as it seemed a small area and note generalized rash  all other   12  system is been examined.     CBC Latest Ref Rng 05/27/2015 05/05/2015 04/22/2015  WBC 3.8 - 10.6 K/uL 6.5 5.2 11.7(H)  Hemoglobin 13.0 - 18.0 g/dL 9.1(L) 8.1(L) 9.4(L)  Hematocrit 40.0 - 52.0 % 27.9(L) 24.2(L) 28.4(L)  Platelets 150 - 440 K/uL 220 281 169    IMPRESSION: IMPRESSION: 1. Hypermetabolic left supraclavicular and upper abdominal adenopathy adenopathy, worrisome for metastatic disease 2. Mild  residual hypermetabolism within small to borderline enlarged  mediastinal lymph nodes. 3. Presumed to radiation changes in the left lung. Given presence of patchy ground-glass in the right lung, multilobar pneumonia is not excluded. 4. Slight decrease in size of an omental nodule which does not show abnormal hypermetabolism. 5. Small left pleural effusion. 6. Cholelithiasis.   ASSESSMENT: \ 1.  Carcinoma of lung left upper lobe.  Stage IIIa squamous cell carcinoma.  I General condition is improved with NIVOLULAMAB will continue that at present time 2.  Bilateral pulmonary infiltrates secondary to infection versus pneumonitis due to [facility] PET scan has been reviewed independently Patient was hospitalized at [facility] records were reviewed Possibility of multilobar pneumonia versus pneumonitis secondary to [place] with steroids.  Patient has been now off steroid chest x-ray has been reviewed show significant improvement Continue follow-up without any intervention at present time. Repeat PET scan in 8 weeks for reevaluation of disease She would be reevaluated in 8 weeks after PET scan by Dr. B Was advised to call me if there is any significant problem in the interim time    Staging form: Lung, AJCC 7th Edition     Clinical: T2, N2, M0 - Marni Griffon, MD   05/30/2015 7:21 AM

## 2015-05-31 ENCOUNTER — Other Ambulatory Visit: Payer: Self-pay | Admitting: Family Medicine

## 2015-05-31 ENCOUNTER — Telehealth: Payer: Self-pay | Admitting: *Deleted

## 2015-05-31 MED ORDER — AMOXICILLIN-POT CLAVULANATE 875-125 MG PO TABS: 1.0000 | ORAL_TABLET | Freq: Two times a day (BID) | ORAL | Status: AC

## 2015-05-31 MED ORDER — PREDNISONE 10 MG PO TABS: 60.0000 mg | ORAL_TABLET | Freq: Every day | ORAL | Status: AC

## 2015-05-31 NOTE — Telephone Encounter (Signed)
Per Magda Paganini refilled steroids and abx. Olivia Mackie informed and said she will pick up rx

## 2015-05-31 NOTE — Telephone Encounter (Signed)
Called to states he has same sx as the first of the month when he was hospitalized. Temp 100, sob, not voiding much and it is dark in color. Completed  Abx and steroids 2 weeks ago. Felt he developed a cold over the weekend and then "this morning he bottomed out" Unable to get up without assistance of 2 people. Asking what needes to be done

## 2015-06-13 ENCOUNTER — Inpatient Hospital Stay (HOSPITAL_COMMUNITY): Payer: Medicare HMO

## 2015-06-13 ENCOUNTER — Inpatient Hospital Stay (HOSPITAL_COMMUNITY)
Admission: AD | Admit: 2015-06-13 | Discharge: 2015-06-21 | DRG: 064 | Disposition: A | Discharge status: Rehabilitation Facility | Payer: Medicare HMO | Source: Other Acute Inpatient Hospital | Attending: Neurology | Admitting: Neurology

## 2015-06-13 ENCOUNTER — Emergency Department
Admission: EM | Admit: 2015-06-13 | Discharge: 2015-06-13 | Disposition: A | Discharge status: Home / Self Care | Payer: Medicare HMO | Attending: Neurology | Admitting: Neurology

## 2015-06-13 ENCOUNTER — Emergency Department: Payer: Medicare HMO

## 2015-06-13 ENCOUNTER — Encounter: Payer: Self-pay | Admitting: Emergency Medicine

## 2015-06-13 DIAGNOSIS — Z79899 Other long term (current) drug therapy: Secondary | ICD-10-CM | POA: Diagnosis not present

## 2015-06-13 DIAGNOSIS — Z7951 Long term (current) use of inhaled steroids: Secondary | ICD-10-CM | POA: Diagnosis not present

## 2015-06-13 DIAGNOSIS — E1142 Type 2 diabetes mellitus with diabetic polyneuropathy: Secondary | ICD-10-CM | POA: Diagnosis present

## 2015-06-13 DIAGNOSIS — R579 Shock, unspecified: Secondary | ICD-10-CM | POA: Diagnosis not present

## 2015-06-13 DIAGNOSIS — N179 Acute kidney failure, unspecified: Secondary | ICD-10-CM | POA: Diagnosis not present

## 2015-06-13 DIAGNOSIS — R29706 NIHSS score 6: Secondary | ICD-10-CM | POA: Diagnosis present

## 2015-06-13 DIAGNOSIS — Z951 Presence of aortocoronary bypass graft: Secondary | ICD-10-CM | POA: Insufficient documentation

## 2015-06-13 DIAGNOSIS — R05 Cough: Secondary | ICD-10-CM | POA: Diagnosis not present

## 2015-06-13 DIAGNOSIS — Z87891 Personal history of nicotine dependence: Secondary | ICD-10-CM

## 2015-06-13 DIAGNOSIS — Z9221 Personal history of antineoplastic chemotherapy: Secondary | ICD-10-CM | POA: Diagnosis not present

## 2015-06-13 DIAGNOSIS — I63412 Cerebral infarction due to embolism of left middle cerebral artery: Secondary | ICD-10-CM | POA: Diagnosis not present

## 2015-06-13 DIAGNOSIS — Z01818 Encounter for other preprocedural examination: Secondary | ICD-10-CM

## 2015-06-13 DIAGNOSIS — M5489 Other dorsalgia: Secondary | ICD-10-CM | POA: Diagnosis not present

## 2015-06-13 DIAGNOSIS — J9601 Acute respiratory failure with hypoxia: Secondary | ICD-10-CM | POA: Diagnosis not present

## 2015-06-13 DIAGNOSIS — Z923 Personal history of irradiation: Secondary | ICD-10-CM

## 2015-06-13 DIAGNOSIS — F05 Delirium due to known physiological condition: Secondary | ICD-10-CM | POA: Diagnosis not present

## 2015-06-13 DIAGNOSIS — R710 Precipitous drop in hematocrit: Secondary | ICD-10-CM | POA: Diagnosis not present

## 2015-06-13 DIAGNOSIS — I129 Hypertensive chronic kidney disease with stage 1 through stage 4 chronic kidney disease, or unspecified chronic kidney disease: Secondary | ICD-10-CM | POA: Diagnosis present

## 2015-06-13 DIAGNOSIS — I2510 Atherosclerotic heart disease of native coronary artery without angina pectoris: Secondary | ICD-10-CM | POA: Insufficient documentation

## 2015-06-13 DIAGNOSIS — I611 Nontraumatic intracerebral hemorrhage in hemisphere, cortical: Secondary | ICD-10-CM | POA: Diagnosis present

## 2015-06-13 DIAGNOSIS — J449 Chronic obstructive pulmonary disease, unspecified: Secondary | ICD-10-CM | POA: Insufficient documentation

## 2015-06-13 DIAGNOSIS — D649 Anemia, unspecified: Secondary | ICD-10-CM | POA: Diagnosis present

## 2015-06-13 DIAGNOSIS — I619 Nontraumatic intracerebral hemorrhage, unspecified: Secondary | ICD-10-CM

## 2015-06-13 DIAGNOSIS — I469 Cardiac arrest, cause unspecified: Secondary | ICD-10-CM | POA: Diagnosis not present

## 2015-06-13 DIAGNOSIS — R29810 Facial weakness: Secondary | ICD-10-CM | POA: Diagnosis present

## 2015-06-13 DIAGNOSIS — E785 Hyperlipidemia, unspecified: Secondary | ICD-10-CM | POA: Diagnosis present

## 2015-06-13 DIAGNOSIS — J9600 Acute respiratory failure, unspecified whether with hypoxia or hypercapnia: Secondary | ICD-10-CM

## 2015-06-13 DIAGNOSIS — K219 Gastro-esophageal reflux disease without esophagitis: Secondary | ICD-10-CM | POA: Diagnosis present

## 2015-06-13 DIAGNOSIS — Z7984 Long term (current) use of oral hypoglycemic drugs: Secondary | ICD-10-CM | POA: Diagnosis not present

## 2015-06-13 DIAGNOSIS — N390 Urinary tract infection, site not specified: Secondary | ICD-10-CM | POA: Diagnosis not present

## 2015-06-13 DIAGNOSIS — T83518A Infection and inflammatory reaction due to other urinary catheter, initial encounter: Secondary | ICD-10-CM | POA: Diagnosis not present

## 2015-06-13 DIAGNOSIS — I480 Paroxysmal atrial fibrillation: Secondary | ICD-10-CM | POA: Insufficient documentation

## 2015-06-13 DIAGNOSIS — E119 Type 2 diabetes mellitus without complications: Secondary | ICD-10-CM

## 2015-06-13 DIAGNOSIS — C3490 Malignant neoplasm of unspecified part of unspecified bronchus or lung: Secondary | ICD-10-CM | POA: Insufficient documentation

## 2015-06-13 DIAGNOSIS — C7931 Secondary malignant neoplasm of brain: Secondary | ICD-10-CM | POA: Diagnosis present

## 2015-06-13 DIAGNOSIS — N183 Chronic kidney disease, stage 3 unspecified: Secondary | ICD-10-CM | POA: Diagnosis present

## 2015-06-13 DIAGNOSIS — I950 Idiopathic hypotension: Secondary | ICD-10-CM | POA: Diagnosis present

## 2015-06-13 DIAGNOSIS — R208 Other disturbances of skin sensation: Secondary | ICD-10-CM | POA: Diagnosis not present

## 2015-06-13 DIAGNOSIS — T45515A Adverse effect of anticoagulants, initial encounter: Secondary | ICD-10-CM | POA: Diagnosis present

## 2015-06-13 DIAGNOSIS — G936 Cerebral edema: Secondary | ICD-10-CM | POA: Diagnosis not present

## 2015-06-13 DIAGNOSIS — I252 Old myocardial infarction: Secondary | ICD-10-CM

## 2015-06-13 DIAGNOSIS — Z515 Encounter for palliative care: Secondary | ICD-10-CM | POA: Diagnosis not present

## 2015-06-13 DIAGNOSIS — F4323 Adjustment disorder with mixed anxiety and depressed mood: Secondary | ICD-10-CM | POA: Diagnosis not present

## 2015-06-13 DIAGNOSIS — R059 Cough, unspecified: Secondary | ICD-10-CM | POA: Diagnosis present

## 2015-06-13 DIAGNOSIS — R569 Unspecified convulsions: Secondary | ICD-10-CM | POA: Diagnosis not present

## 2015-06-13 DIAGNOSIS — I612 Nontraumatic intracerebral hemorrhage in hemisphere, unspecified: Secondary | ICD-10-CM | POA: Diagnosis not present

## 2015-06-13 DIAGNOSIS — D538 Other specified nutritional anemias: Secondary | ICD-10-CM | POA: Diagnosis not present

## 2015-06-13 DIAGNOSIS — E1165 Type 2 diabetes mellitus with hyperglycemia: Secondary | ICD-10-CM | POA: Diagnosis present

## 2015-06-13 DIAGNOSIS — Z4659 Encounter for fitting and adjustment of other gastrointestinal appliance and device: Secondary | ICD-10-CM

## 2015-06-13 DIAGNOSIS — R531 Weakness: Secondary | ICD-10-CM | POA: Diagnosis present

## 2015-06-13 DIAGNOSIS — C3492 Malignant neoplasm of unspecified part of left bronchus or lung: Secondary | ICD-10-CM | POA: Diagnosis present

## 2015-06-13 DIAGNOSIS — D62 Acute posthemorrhagic anemia: Secondary | ICD-10-CM | POA: Diagnosis not present

## 2015-06-13 DIAGNOSIS — J189 Pneumonia, unspecified organism: Secondary | ICD-10-CM

## 2015-06-13 DIAGNOSIS — I609 Nontraumatic subarachnoid hemorrhage, unspecified: Secondary | ICD-10-CM | POA: Diagnosis present

## 2015-06-13 DIAGNOSIS — G8191 Hemiplegia, unspecified affecting right dominant side: Secondary | ICD-10-CM | POA: Diagnosis present

## 2015-06-13 DIAGNOSIS — R410 Disorientation, unspecified: Secondary | ICD-10-CM | POA: Diagnosis not present

## 2015-06-13 DIAGNOSIS — J9621 Acute and chronic respiratory failure with hypoxia: Secondary | ICD-10-CM | POA: Diagnosis present

## 2015-06-13 DIAGNOSIS — J9690 Respiratory failure, unspecified, unspecified whether with hypoxia or hypercapnia: Secondary | ICD-10-CM

## 2015-06-13 DIAGNOSIS — D689 Coagulation defect, unspecified: Secondary | ICD-10-CM | POA: Diagnosis present

## 2015-06-13 DIAGNOSIS — Z6828 Body mass index (BMI) 28.0-28.9, adult: Secondary | ICD-10-CM | POA: Diagnosis not present

## 2015-06-13 DIAGNOSIS — Z7901 Long term (current) use of anticoagulants: Secondary | ICD-10-CM

## 2015-06-13 DIAGNOSIS — M4806 Spinal stenosis, lumbar region: Secondary | ICD-10-CM | POA: Diagnosis present

## 2015-06-13 DIAGNOSIS — Z96651 Presence of right artificial knee joint: Secondary | ICD-10-CM | POA: Diagnosis present

## 2015-06-13 DIAGNOSIS — E663 Overweight: Secondary | ICD-10-CM | POA: Diagnosis present

## 2015-06-13 DIAGNOSIS — Z452 Encounter for adjustment and management of vascular access device: Secondary | ICD-10-CM

## 2015-06-13 DIAGNOSIS — I69151 Hemiplegia and hemiparesis following nontraumatic intracerebral hemorrhage affecting right dominant side: Secondary | ICD-10-CM | POA: Diagnosis not present

## 2015-06-13 DIAGNOSIS — Z09 Encounter for follow-up examination after completed treatment for conditions other than malignant neoplasm: Secondary | ICD-10-CM

## 2015-06-13 DIAGNOSIS — D508 Other iron deficiency anemias: Secondary | ICD-10-CM | POA: Diagnosis not present

## 2015-06-13 DIAGNOSIS — C3412 Malignant neoplasm of upper lobe, left bronchus or lung: Secondary | ICD-10-CM | POA: Diagnosis not present

## 2015-06-13 DIAGNOSIS — I610 Nontraumatic intracerebral hemorrhage in hemisphere, subcortical: Secondary | ICD-10-CM | POA: Diagnosis not present

## 2015-06-13 DIAGNOSIS — I69398 Other sequelae of cerebral infarction: Secondary | ICD-10-CM | POA: Diagnosis not present

## 2015-06-13 DIAGNOSIS — I10 Essential (primary) hypertension: Secondary | ICD-10-CM | POA: Diagnosis not present

## 2015-06-13 DIAGNOSIS — D6832 Hemorrhagic disorder due to extrinsic circulating anticoagulants: Secondary | ICD-10-CM | POA: Diagnosis not present

## 2015-06-13 DIAGNOSIS — H9190 Unspecified hearing loss, unspecified ear: Secondary | ICD-10-CM | POA: Diagnosis present

## 2015-06-13 DIAGNOSIS — G9340 Encephalopathy, unspecified: Secondary | ICD-10-CM | POA: Diagnosis not present

## 2015-06-13 DIAGNOSIS — I639 Cerebral infarction, unspecified: Secondary | ICD-10-CM

## 2015-06-13 DIAGNOSIS — R64 Cachexia: Secondary | ICD-10-CM | POA: Diagnosis not present

## 2015-06-13 DIAGNOSIS — C3432 Malignant neoplasm of lower lobe, left bronchus or lung: Secondary | ICD-10-CM | POA: Diagnosis not present

## 2015-06-13 LAB — DIFFERENTIAL
Basophils Absolute: 0 10*3/uL (ref 0–0.1)
Basophils Relative: 0 %
Eosinophils Absolute: 0 10*3/uL (ref 0–0.7)
Eosinophils Relative: 1 %
LYMPHS PCT: 10 %
Lymphs Abs: 0.6 10*3/uL — ABNORMAL LOW (ref 1.0–3.6)
MONO ABS: 0.6 10*3/uL (ref 0.2–1.0)
MONOS PCT: 10 %
NEUTROS ABS: 4.6 10*3/uL (ref 1.4–6.5)
Neutrophils Relative %: 79 %

## 2015-06-13 LAB — CBC
HEMATOCRIT: 26.8 % — AB (ref 40.0–52.0)
HEMOGLOBIN: 8.9 g/dL — AB (ref 13.0–18.0)
MCH: 28.6 pg (ref 26.0–34.0)
MCHC: 33.3 g/dL (ref 32.0–36.0)
MCV: 86 fL (ref 80.0–100.0)
Platelets: 234 10*3/uL (ref 150–440)
RBC: 3.11 MIL/uL — AB (ref 4.40–5.90)
RDW: 20.8 % — ABNORMAL HIGH (ref 11.5–14.5)
WBC: 5.9 10*3/uL (ref 3.8–10.6)

## 2015-06-13 LAB — COMPREHENSIVE METABOLIC PANEL
ALK PHOS: 74 U/L (ref 38–126)
ALT: 15 U/L — AB (ref 17–63)
AST: 18 U/L (ref 15–41)
Albumin: 3.7 g/dL (ref 3.5–5.0)
Anion gap: 11 (ref 5–15)
BUN: 20 mg/dL (ref 6–20)
CALCIUM: 9.3 mg/dL (ref 8.9–10.3)
CO2: 23 mmol/L (ref 22–32)
CREATININE: 1.34 mg/dL — AB (ref 0.61–1.24)
Chloride: 103 mmol/L (ref 101–111)
GFR, EST AFRICAN AMERICAN: 58 mL/min — AB (ref >60)
GFR, EST NON AFRICAN AMERICAN: 50 mL/min — AB (ref >60)
Glucose, Bld: 139 mg/dL — ABNORMAL HIGH (ref 65–99)
Potassium: 4.6 mmol/L (ref 3.5–5.1)
Sodium: 137 mmol/L (ref 135–145)
Total Bilirubin: 0.4 mg/dL (ref 0.3–1.2)
Total Protein: 7.6 g/dL (ref 6.5–8.1)

## 2015-06-13 LAB — BLOOD GAS, ARTERIAL
ACID-BASE DEFICIT: 4.7 mmol/L — AB (ref 0.0–2.0)
BICARBONATE: 19.6 meq/L — AB (ref 20.0–24.0)
Drawn by: 441371
FIO2: 1
LHR: 18 {breaths}/min
O2 Saturation: 99.7 %
PATIENT TEMPERATURE: 98.6
PEEP/CPAP: 5 cmH2O
PO2 ART: 337 mmHg — AB (ref 80.0–100.0)
TCO2: 20.6 mmol/L (ref 0–100)
VT: 610 mL
pCO2 arterial: 34.2 mmHg — ABNORMAL LOW (ref 35.0–45.0)
pH, Arterial: 7.376 (ref 7.350–7.450)

## 2015-06-13 LAB — TROPONIN I

## 2015-06-13 LAB — APTT: aPTT: 37 seconds — ABNORMAL HIGH (ref 24–36)

## 2015-06-13 LAB — URINALYSIS COMPLETE WITH MICROSCOPIC (ARMC ONLY)
BACTERIA UA: NONE SEEN
Bilirubin Urine: NEGATIVE
Glucose, UA: NEGATIVE mg/dL
Hgb urine dipstick: NEGATIVE
KETONES UR: NEGATIVE mg/dL
Leukocytes, UA: NEGATIVE
Nitrite: NEGATIVE
PH: 6 (ref 5.0–8.0)
PROTEIN: NEGATIVE mg/dL
SQUAMOUS EPITHELIAL / LPF: NONE SEEN
Specific Gravity, Urine: 1.01 (ref 1.005–1.030)

## 2015-06-13 LAB — GLUCOSE, CAPILLARY
GLUCOSE-CAPILLARY: 131 mg/dL — AB (ref 65–99)
GLUCOSE-CAPILLARY: 116 mg/dL — AB (ref 65–99)
GLUCOSE-CAPILLARY: 134 mg/dL — AB (ref 65–99)
Glucose-Capillary: 331 mg/dL — ABNORMAL HIGH (ref 65–99)
Glucose-Capillary: 321 mg/dL — ABNORMAL HIGH (ref 65–99)

## 2015-06-13 LAB — TYPE AND SCREEN
ABO/RH(D): O NEG
ANTIBODY SCREEN: NEGATIVE

## 2015-06-13 LAB — PROTIME-INR
INR: 1.84
Prothrombin Time: 21.2 seconds — ABNORMAL HIGH (ref 11.4–15.0)

## 2015-06-13 LAB — MRSA PCR SCREENING: MRSA by PCR: NEGATIVE

## 2015-06-13 MED ORDER — CHLORHEXIDINE GLUCONATE 0.12% ORAL RINSE (MEDLINE KIT): 15.0000 mL | Freq: Two times a day (BID) | OROMUCOSAL | Status: DC

## 2015-06-13 MED ORDER — HYDROCOD POLST-CPM POLST ER 10-8 MG/5ML PO SUER
5.0000 mL | Freq: Two times a day (BID) | ORAL | Status: DC | PRN
  Administered 2015-06-15 – 2015-06-20 (×7): 5 mL via ORAL
  Filled 2015-06-13 (×8): qty 5

## 2015-06-13 MED ORDER — PROTHROMBIN COMPLEX CONC HUMAN 500 UNITS IV KIT
4713.0000 [IU] | PACK | Status: AC
  Administered 2015-06-13: 4713 [IU] via INTRAVENOUS
  Filled 2015-06-13: qty 189

## 2015-06-13 MED ORDER — SENNOSIDES-DOCUSATE SODIUM 8.6-50 MG PO TABS: 1.0000 | ORAL_TABLET | Freq: Two times a day (BID) | ORAL | Status: DC

## 2015-06-13 MED ORDER — ETOMIDATE 2 MG/ML IV SOLN
20.0000 mg | Freq: Once | INTRAVENOUS | Status: AC
  Administered 2015-06-13: 20 mg via INTRAVENOUS

## 2015-06-13 MED ORDER — INSULIN ASPART 100 UNIT/ML ~~LOC~~ SOLN
0.0000 [IU] | SUBCUTANEOUS | Status: DC
  Administered 2015-06-13: 2 [IU] via SUBCUTANEOUS
  Administered 2015-06-13: 16 [IU] via SUBCUTANEOUS
  Administered 2015-06-14: 12 [IU] via SUBCUTANEOUS
  Administered 2015-06-14: 4 [IU] via SUBCUTANEOUS
  Administered 2015-06-14: 8 [IU] via SUBCUTANEOUS
  Administered 2015-06-14 (×2): 16 [IU] via SUBCUTANEOUS
  Administered 2015-06-14: 12 [IU] via SUBCUTANEOUS
  Administered 2015-06-15: 4 [IU] via SUBCUTANEOUS
  Administered 2015-06-15: 8 [IU] via SUBCUTANEOUS
  Administered 2015-06-15 (×2): 12 [IU] via SUBCUTANEOUS
  Administered 2015-06-15 – 2015-06-16 (×4): 4 [IU] via SUBCUTANEOUS
  Administered 2015-06-16: 16 [IU] via SUBCUTANEOUS
  Administered 2015-06-16: 8 [IU] via SUBCUTANEOUS
  Administered 2015-06-16 – 2015-06-21 (×12): 2 [IU] via SUBCUTANEOUS

## 2015-06-13 MED ORDER — NIMODIPINE 30 MG PO CAPS
30.0000 mg | ORAL_CAPSULE | Freq: Four times a day (QID) | ORAL | Status: DC
  Administered 2015-06-13: 30 mg via ORAL
  Filled 2015-06-13: qty 1

## 2015-06-13 MED ORDER — SODIUM CHLORIDE 0.9 % IV SOLN
1500.0000 mg | Freq: Two times a day (BID) | INTRAVENOUS | Status: DC
  Administered 2015-06-13 – 2015-06-16 (×6): 1500 mg via INTRAVENOUS
  Filled 2015-06-13 (×7): qty 15

## 2015-06-13 MED ORDER — NIMODIPINE 60 MG/20ML PO SOLN
30.0000 mg | Freq: Four times a day (QID) | ORAL | Status: DC
  Administered 2015-06-14 (×2): 30 mg via ORAL
  Filled 2015-06-13 (×2): qty 20

## 2015-06-13 MED ORDER — CHARCOAL ACTIVATED PO LIQD
50.0000 g | Freq: Once | ORAL | Status: AC
  Administered 2015-06-13: 50 g via ORAL
  Filled 2015-06-13: qty 480
  Filled 2015-06-13: qty 240

## 2015-06-13 MED ORDER — BENZONATATE 100 MG PO CAPS
100.0000 mg | ORAL_CAPSULE | Freq: Three times a day (TID) | ORAL | Status: DC | PRN
  Filled 2015-06-13: qty 1

## 2015-06-13 MED ORDER — STROKE: EARLY STAGES OF RECOVERY BOOK: Freq: Once | Status: DC

## 2015-06-13 MED ORDER — MIDAZOLAM HCL 2 MG/2ML IJ SOLN
INTRAMUSCULAR | Status: AC
  Filled 2015-06-13: qty 4

## 2015-06-13 MED ORDER — MIDAZOLAM HCL 2 MG/2ML IJ SOLN
4.0000 mg | Freq: Once | INTRAMUSCULAR | Status: AC
Start: 2015-06-13 — End: 2015-06-13
  Administered 2015-06-13: 2 mg via INTRAVENOUS

## 2015-06-13 MED ORDER — AMIODARONE HCL 200 MG PO TABS
200.0000 mg | ORAL_TABLET | Freq: Every day | ORAL | Status: DC
  Administered 2015-06-13 – 2015-06-21 (×9): 200 mg via ORAL
  Filled 2015-06-13 (×9): qty 1

## 2015-06-13 MED ORDER — FENTANYL CITRATE (PF) 100 MCG/2ML IJ SOLN
INTRAMUSCULAR | Status: AC
  Filled 2015-06-13: qty 2

## 2015-06-13 MED ORDER — NIMODIPINE 30 MG PO CAPS
30.0000 mg | ORAL_CAPSULE | Freq: Four times a day (QID) | ORAL | Status: DC
  Administered 2015-06-13: 30 mg via ORAL
  Filled 2015-06-13 (×3): qty 1

## 2015-06-13 MED ORDER — ANTISEPTIC ORAL RINSE SOLUTION (CORINZ)
7.0000 mL | OROMUCOSAL | Status: DC
  Administered 2015-06-14 – 2015-06-15 (×17): 7 mL via OROMUCOSAL

## 2015-06-13 MED ORDER — PANTOPRAZOLE SODIUM 40 MG PO TBEC: 40.0000 mg | DELAYED_RELEASE_TABLET | Freq: Two times a day (BID) | ORAL | Status: DC

## 2015-06-13 MED ORDER — SODIUM CHLORIDE 0.9 % IV SOLN
15.0000 mg/kg | Freq: Once | INTRAVENOUS | Status: AC
  Administered 2015-06-13: 1417.5 mg via INTRAVENOUS
  Filled 2015-06-13: qty 28.35

## 2015-06-13 MED ORDER — ANTISEPTIC ORAL RINSE SOLUTION (CORINZ): 7.0000 mL | Freq: Four times a day (QID) | OROMUCOSAL | Status: DC

## 2015-06-13 MED ORDER — LABETALOL HCL 5 MG/ML IV SOLN: 10.0000 mg | INTRAVENOUS | Status: DC | PRN

## 2015-06-13 MED ORDER — PANTOPRAZOLE SODIUM 40 MG PO TBEC
40.0000 mg | DELAYED_RELEASE_TABLET | Freq: Every day | ORAL | Status: DC
  Administered 2015-06-13: 40 mg via ORAL
  Filled 2015-06-13: qty 1

## 2015-06-13 MED ORDER — ACETAMINOPHEN 325 MG PO TABS
650.0000 mg | ORAL_TABLET | ORAL | Status: DC | PRN
  Administered 2015-06-17 – 2015-06-21 (×5): 650 mg via ORAL
  Filled 2015-06-13 (×5): qty 2

## 2015-06-13 MED ORDER — ACETAMINOPHEN 325 MG PO TABS: 650.0000 mg | ORAL_TABLET | ORAL | Status: DC | PRN

## 2015-06-13 MED ORDER — LORAZEPAM 2 MG/ML IJ SOLN
INTRAMUSCULAR | Status: AC
  Filled 2015-06-13: qty 1

## 2015-06-13 MED ORDER — LABETALOL HCL 5 MG/ML IV SOLN: 5.0000 mg | INTRAVENOUS | Status: DC | PRN

## 2015-06-13 MED ORDER — SENNOSIDES-DOCUSATE SODIUM 8.6-50 MG PO TABS
1.0000 | ORAL_TABLET | Freq: Two times a day (BID) | ORAL | Status: DC
  Administered 2015-06-13 – 2015-06-21 (×15): 1 via ORAL
  Filled 2015-06-13 (×15): qty 1

## 2015-06-13 MED ORDER — DEXAMETHASONE SODIUM PHOSPHATE 10 MG/ML IJ SOLN
10.0000 mg | Freq: Once | INTRAMUSCULAR | Status: AC
  Administered 2015-06-13: 10 mg via INTRAVENOUS
  Filled 2015-06-13: qty 1

## 2015-06-13 MED ORDER — SODIUM CHLORIDE 0.9 % IV SOLN
INTRAVENOUS | Status: DC
  Administered 2015-06-13 – 2015-06-20 (×4): via INTRAVENOUS

## 2015-06-13 MED ORDER — ACETAMINOPHEN 650 MG RE SUPP: 650.0000 mg | RECTAL | Status: DC | PRN

## 2015-06-13 MED ORDER — PROPOFOL 500 MG/50ML IV EMUL
INTRAVENOUS | Status: AC
  Filled 2015-06-13: qty 50

## 2015-06-13 MED ORDER — PHENYLEPHRINE HCL 10 MG/ML IJ SOLN
0.0000 ug/min | INTRAMUSCULAR | Status: DC
  Administered 2015-06-13: 100 ug/min via INTRAVENOUS
  Administered 2015-06-14: 50 ug/min via INTRAVENOUS
  Administered 2015-06-14: 140 ug/min via INTRAVENOUS
  Filled 2015-06-13 (×5): qty 4

## 2015-06-13 MED ORDER — DEXAMETHASONE SODIUM PHOSPHATE 4 MG/ML IJ SOLN
4.0000 mg | Freq: Four times a day (QID) | INTRAMUSCULAR | Status: DC
  Administered 2015-06-14 – 2015-06-16 (×10): 4 mg via INTRAVENOUS
  Filled 2015-06-13 (×11): qty 1

## 2015-06-13 MED ORDER — CHLORHEXIDINE GLUCONATE 0.12% ORAL RINSE (MEDLINE KIT)
15.0000 mL | Freq: Two times a day (BID) | OROMUCOSAL | Status: DC
  Administered 2015-06-14 – 2015-06-15 (×3): 15 mL via OROMUCOSAL

## 2015-06-13 MED ORDER — STROKE: EARLY STAGES OF RECOVERY BOOK
Freq: Once | Status: AC
  Administered 2015-06-13: 16:00:00
  Filled 2015-06-13: qty 1

## 2015-06-13 MED ORDER — IPRATROPIUM-ALBUTEROL 0.5-2.5 (3) MG/3ML IN SOLN
3.0000 mL | Freq: Four times a day (QID) | RESPIRATORY_TRACT | Status: DC
  Administered 2015-06-13 – 2015-06-15 (×7): 3 mL via RESPIRATORY_TRACT
  Filled 2015-06-13 (×7): qty 3

## 2015-06-13 MED ORDER — PROPOFOL 1000 MG/100ML IV EMUL
5.0000 ug/kg/min | INTRAVENOUS | Status: DC
  Administered 2015-06-13: 30 ug/kg/min via INTRAVENOUS
  Administered 2015-06-13: 35 ug/kg/min via INTRAVENOUS
  Administered 2015-06-14 (×4): 25 ug/kg/min via INTRAVENOUS
  Administered 2015-06-15: 15 ug/kg/min via INTRAVENOUS
  Filled 2015-06-13 (×6): qty 100

## 2015-06-13 MED ORDER — LORAZEPAM 2 MG/ML IJ SOLN
2.0000 mg | Freq: Once | INTRAMUSCULAR | Status: AC
  Administered 2015-06-13: 2 mg via INTRAVENOUS

## 2015-06-13 MED ORDER — PROTHROMBIN COMPLEX CONC HUMAN 500 UNITS IV KIT
2500.0000 [IU] | PACK | Status: DC
  Filled 2015-06-13: qty 100

## 2015-06-13 MED ORDER — FENTANYL CITRATE (PF) 100 MCG/2ML IJ SOLN
50.0000 ug | Freq: Once | INTRAMUSCULAR | Status: AC
  Administered 2015-06-13: 50 ug via INTRAVENOUS

## 2015-06-13 MED ORDER — TIOTROPIUM BROMIDE MONOHYDRATE 18 MCG IN CAPS
18.0000 ug | ORAL_CAPSULE | Freq: Every day | RESPIRATORY_TRACT | Status: DC
  Filled 2015-06-13: qty 5

## 2015-06-13 NOTE — ED Notes (Signed)
Pt states he was awake at 2 am, 4am and then woke up at 5am with right sided weakness and unable to reach for his pulse ox at that time with his right arm. Pt denies any prior hx of CVA.  Pt is unable to ambulate like he normally can but states he did try.  No slurred speech noted at this time.

## 2015-06-13 NOTE — ED Notes (Signed)
Returned from CT.

## 2015-06-13 NOTE — ED Notes (Signed)
Pt given a few ice chips at this time with Dr. Tiffany Kocher approval. Pt already passed swallow screen.

## 2015-06-13 NOTE — Progress Notes (Signed)
eLink Physician-Brief Progress Note Patient Name: Ivan Beasley DOB: Jul 09, 1940 MRN: [id number]   Date of Service  06/13/2015  HPI/Events of Note  RT calls for vent orders. O2 sats > 92% on 40%. Intubated for airway protection  eICU Interventions  Vent orders placed. Pt is not in ARDS,         Rush Landmark 06/13/2015, 11:38 PM

## 2015-06-13 NOTE — Progress Notes (Signed)
[facility] Progress Note Patient Name: Ivan Beasley DOB: [birth year] MRN: [id number]   Date of Service  06/13/2015  HPI/Events of Note  No H&P in EPIC at this time. Video assessment >> looks comfortable. Sat = 96% and RR = 23. BP = 143/66 and HR = 85.  eICU Interventions  Await H&P. Continue present management.     Intervention Category Evaluation Type: New Patient Evaluation  Lysle Dingwall 06/13/2015, 4:33 PM

## 2015-06-13 NOTE — Progress Notes (Signed)
   06/13/15 1900  Clinical Encounter Type  Visited With Patient and family together  Visit Type Spiritual support  Referral From Nurse  Spiritual Encounters  Spiritual Needs Prayer;Emotional  Stress Factors  Patient Stress Factors Not reviewed  Family Stress Factors Health changes;Major life changes  Chaplain responded to page from nurse.  When chaplain arrived to room, patient having procedure completed and wife was in the corner, watching.  Chaplain approached wife and offered spiritual and emotional support while she waited for healthcare staff to care for patient.  Chaplain waited with wife until asked to leave room for a sterile procedure.  Chaplain accompanied wife to 33M waiting room by [place].  Chaplain offered prayer and ministry of presence to wife.  Another family member arrived to support wife and wait with her.  Wife received prayer from [place] but did say that patient would like a priest to do his anointing when needed.  Advised wife that we could certainly contact a priest on call when the time came for an anointing.  Will advise on coming chaplain to check on the family later this evening.  Vilinda Blanks [facility]  06/13/2015  7:53 PM

## 2015-06-13 NOTE — Consult Note (Signed)
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 He was on Eliquis for PAF and develop ight hemiplegia 6/4 and was found to have left frontal ICH and was transferred to [facility]. PCCM asked to follow.  PAST MEDICAL HISTORY :  He  has a past medical history of Coronary artery disease; Pneumonia (2000); Arthritis; Chronic back pain; Bruises easily; GERD (gastroesophageal reflux disease); Blood transfusion (2001); Diabetes mellitus; Impaired hearing; Macular degeneration; Myocardial infarction ([place]) (2001); and Cancer ([place]).  PAST SURGICAL HISTORY: He  has past surgical history that includes Coronary artery bypass graft (2001); Cardiac catheterization (2001); Tonsillectomy; Colonoscopy; Cataract extraction; Lumbar laminectomy/decompression microdiscectomy (12/19/2010); Joint replacement (right tkr); Eye surgery; Back surgery; and Portacath placement (N/A, 05/20/2014).  No Known Allergies  Current Facility-Administered Medications on File Prior to Encounter  Medication  . 0.9 %  sodium chloride infusion  . lidocaine-prilocaine (EMLA) cream  . sodium chloride 0.9 % injection 10 mL  . sodium chloride 0.9 % injection 10 mL  . sodium chloride 0.9 % injection 10 mL  . sodium chloride flush (NS) 0.9 % injection 10 mL   Current Outpatient Prescriptions on File Prior to Encounter  Medication Sig  . albuterol (PROVENTIL) (2.5 MG/3ML) 0.083% nebulizer solution Take 3 mLs (2.5 mg total) by nebulization every 6 (six) hours as needed for wheezing or shortness of breath.  . ALPRAZolam (XANAX) 0.25 MG tablet  Take 1 tablet (0.25 mg total) by mouth 3 (three) times daily as needed for anxiety.  Marland Kitchen amiodarone (PACERONE) 200 MG tablet Take 1 tablet (200 mg total) by mouth daily.  Marland Kitchen amoxicillin-clavulanate (AUGMENTIN) 875-125 MG tablet Take 1 tablet by mouth 2 (two) times daily.  Marland Kitchen apixaban (ELIQUIS) 5 MG TABS tablet Take 1 tablet (5 mg total) by mouth 2 (two) times daily.  . benzonatate (TESSALON) 100 MG capsule TAKE 1 CAPSULE (100 MG TOTAL) BY MOUTH 3 (THREE) TIMES DAILY AS NEEDED FOR COUGH.  . bismuth subsalicylate (KAOPECTATE) 262 MG/15ML suspension Take 30 mLs by mouth 3 (three) times daily as needed.  . Blood Glucose Calibration (TRUETEST CONTROL LEVEL 1) LIQD Use as directed. Reported on 04/17/2015  . diltiazem (CARDIZEM) 60 MG tablet Take 60 mg by mouth 2 (two) times daily.  . diphenoxylate-atropine (LOMOTIL) 2.5-0.025 MG tablet TAKE 1 TABLET EVERY 4 HOURS AS NEEDED FOR DIARRHEA /LOOSE STOOL  . glimepiride (AMARYL) 1 MG tablet Take 1 mg by mouth daily.   Marland Kitchen glucose blood test strip Use as directed.  [place] Guise Devices (TRUEDRAW LANCING DEVICE) MISC Use as directed.  . lidocaine-prilocaine (EMLA) cream Apply 1 application topically See admin instructions. 1 application to site area topically as directed.  . magnesium oxide (MAG-OX) 400 MG tablet Take 400 mg by mouth daily.    . Melatonin 5 MG CAPS Take 1 capsule (5 mg total) by mouth at bedtime as needed.  . metFORMIN (GLUCOPHAGE) 500 MG tablet Take 500-1,000 mg by mouth See admin instructions. Take 1000 mg by mouth in the morning,and take 500 mg by mouth every night at bedtime.  Marland Kitchen omeprazole (PRILOSEC) 40 MG capsule Take 40 mg by  mouth daily.   Marland Kitchen oxyCODONE (ROXICODONE) 5 MG immediate release tablet Take 1 tablet (5 mg total) by mouth every 6 (six) hours as needed for severe pain.  . predniSONE (DELTASONE) 10 MG tablet Take 6 tablets (60 mg total) by mouth daily with breakfast. Taper by '10mg'$  every day until complete  . SYMBICORT 160-4.5 MCG/ACT inhaler  Inhale 2 puffs into the lungs 2 (two) times daily.   Marland Kitchen tiotropium (SPIRIVA) 18 MCG inhalation capsule Place 1 capsule (18 mcg total) into inhaler and inhale daily.  . traMADol (ULTRAM) 50 MG tablet Take 50 mg by mouth every 6 (six) hours as needed for moderate pain or severe pain.     FAMILY HISTORY:  His has no family status information on file.   SOCIAL HISTORY: He  reports that he quit smoking about 16 years ago. His smoking use included Cigarettes. He quit after 40 years of use. He has never used smokeless tobacco. He reports that he does not drink alcohol or use illicit drugs.  REVIEW OF SYSTEMS:   10 point review of system taken, please see HPI for positives and negatives.  SUBJECTIVE:  NAD ay rest  VITAL SIGNS: BP 120/74 mmHg  Pulse 82  Temp(Src) 98.4 F (36.9 C) (Oral)  Resp 18  Ht 6' (1.829 m)  Wt 208 lb 5.4 oz (94.5 kg)  BMI 28.25 kg/m2  SpO2 93%  HEMODYNAMICS:    VENTILATOR SETTINGS:    INTAKE / OUTPUT:    PHYSICAL EXAMINATION: General:  Awake and alert. Speech understandable Neuro:  Right hemiplegia, alert and orientated  HEENT: Left facial droop Cardiovascular:  HSR RRR Lungs:  Decreased bases left rhonchi Abdomen:  Tender +bs Musculoskeletal:  intact Skin: warm  LABS:  BMET  Recent Labs Lab 06/13/15 0934  NA 137  K 4.6  CL 103  CO2 23  BUN 20  CREATININE 1.34*  GLUCOSE 139*    Electrolytes  Recent Labs Lab 06/13/15 0934  CALCIUM 9.3    CBC  Recent Labs Lab 06/13/15 0934  WBC 5.9  HGB 8.9*  HCT 26.8*  PLT 234    Coag's  Recent Labs Lab 06/13/15 0934  APTT 37*  INR 1.84    Sepsis Markers No results for input(s): LATICACIDVEN, PROCALCITON, O2SATVEN in the last 168 hours.  ABG No results for input(s): PHART, PCO2ART, PO2ART in the last 168 hours.  Liver Enzymes  Recent Labs Lab 06/13/15 0934  AST 18  ALT 15*  ALKPHOS 74  BILITOT 0.4  ALBUMIN 3.7    Cardiac Enzymes  Recent Labs Lab 06/13/15 0934   TROPONINI <0.03    Glucose  Recent Labs Lab 06/13/15 0931 06/13/15 1205 06/13/15 1638  GLUCAP 131* 116* 134*    Imaging Ct Head Wo Contrast  06/13/2015  CLINICAL DATA:  The woke at 5 a.m. with right-sided weakness. EXAM: CT HEAD WITHOUT CONTRAST TECHNIQUE: Contiguous axial images were obtained from the base of the skull through the vertex without intravenous contrast. COMPARISON:  None. FINDINGS: There is intra cerebral hemorrhage noted in the posterior left frontal lobe. Subarachnoid extension. Hemorrhage measures 2.4 x 2.2 x 1.6 cm for a volume of 4.2 mL. Mild surrounding vasogenic edema. No significant mass effect. No midline shift. No hydrocephalus. No acute calvarial abnormality. Air-fluid level in the left sphenoid sinus. Mastoid air cells are clear. IMPRESSION: Left posterior frontal intra cerebral hemorrhage with subarachnoid extension as described above. Critical Value/emergent results were called by telephone at the time of interpretation on 06/13/2015 at  10:02 am to Dr. Loura Pardon , who verbally acknowledged these results. Electronically Signed   By: Rolm Baptise M.D.   On: 06/13/2015 10:04     STUDIES:  6/4 ct head with left frontal ICH  CULTURES: 6/4 bxc x 2>> 6/4 uc >> 6/4 Sputum>>  ANTIBIOTICS:   SIGNIFICANT EVENTS: 6/4 left hemiplegia  LINES/TUBES:   DISCUSSION: Lung cancer pt on eliquis with new ICH  ASSESSMENT / PLAN:  NEUROLOGIC A:   Right hemiplegia with new(6-4) posterior left frontal lobe cerebral hemorrhage  2.4 x 2.2 in setting of anticoagulation for PAF and dx of Squamous cell left lung cancer, post chemo/rtx. P:   RASS goal: 0 Per neuro   PULMONARY A: Hx of COPD former smoker Hx of Squamous lung cancer Bullous emphysema on CT scan  Cough P:   O2 as needed BD's Cough suppressant   CARDIOVASCULAR A:  Hx of CAD Post OHS PAF P:  Stop eliquis  Follow 12 lead  RENAL Lab Results  Component Value Date   CREATININE 1.34*  06/13/2015   CREATININE 1.66* 05/27/2015   CREATININE 1.48* 05/05/2015   CREATININE 1.20 04/30/2014   CREATININE 1.20 05/23/2013   CREATININE 1.28 05/22/2013    A:   Renal insuff P:   Follow creatine  GASTROINTESTINAL A:   GI protection P:   PPI  HEMATOLOGIC A:   Lung cancer post chemo/rtx abd lymph nodes treated with immunosuppresants P:  Hemonc consult  INFECTIOUS A:   Hx of Pna on Augmentin at home P:   Check cultures Check procal  ENDOCRINE A:   DM P:   SSI  FAMILY  - Updates:  - Inter-disciplinary family meet or Palliative Care meeting due by:  June 14th  Richardson Landry Minor ACNP Maryanna Shape PCCM Pager [phone] till 3 pm If no answer page [phone] 06/13/2015, 4:50 PM   Attending note: I have seen and examined the patient with nurse practitioner/resident and agree with the note. History, labs and imaging reviewed.  39 M with Stage IIIa Sq cell ca of lung, recent chemotherapy, He had a recent admission in April 14 with acute on chronic respiratory failure due to H flu PNA and bacteremia, nivolumab associated pneumonitis. He was treated with abx and steroids He developed atrial flutter during this hospitalization and was started on eliquis, After discharge he has recurrent PNAs and finished a course of augmentin 2 days ago.   Now admitted with acute ICH, seizures. Noted to have asystolic pauses during seizures. PCCM called for assistance with intubation as he need propofol for seizure control.  - Intubation and CVC placement. - ABG and CXR post procedures. - Seizure and ICH management as per neurology - Check procalcitonin. Hold off on abx for now  Critical care time- 30 mins. This represents my time independent of the NPs time taking care of the patient.  Marshell Garfinkel MD [facility] Pager [phone] If no answer or after 3pm call: [phone] 06/13/2015, 6:33 PM

## 2015-06-13 NOTE — Procedures (Signed)
Central Venous Catheter Insertion Procedure Note Ivan Beasley [id number] [birth year]  Procedure: Insertion of Central Venous Catheter Indications: Drug and/or fluid administration  Procedure Details Consent: Risks of procedure as well as the alternatives and risks of each were explained to the (patient/caregiver).  Consent for procedure obtained. Time Out: Verified patient identification, verified procedure, site/side was marked, verified correct patient position, special equipment/implants available, medications/allergies/relevent history reviewed, required imaging and test results available.  Performed  Maximum sterile technique was used including antiseptics, cap, gloves, gown, hand hygiene, mask and sheet. Skin prep: Chlorhexidine; local anesthetic administered A antimicrobial bonded/coated triple lumen catheter was placed in the right internal jugular vein using the Seldinger technique.  Evaluation Blood flow good Complications: No apparent complications Patient did tolerate procedure well. Chest X-ray ordered to verify placement.  CXR: pending.  Ivan Beasley 06/13/2015, 7:28 PM

## 2015-06-13 NOTE — ED Notes (Signed)
[facility] here to transport patient to [facility]

## 2015-06-13 NOTE — ED Notes (Signed)
SOC MD speaking with pt at this time.

## 2015-06-13 NOTE — H&P (Signed)
HPI:                                                                                                                                         Ivan Beasley is an 75 y.o. male patient who  is transferred from [facility] for further neurological care. He went to bed last night normal at 9 PM. He has been making most of the night almost every hour per history. He remembers being normal roughly around 2 AM or so. He woke up again at 4 AM, noted right-sided hemi-plegia. He was taken to [facility] emergency room, where a CT of the head revealed left frontal intracerebral hemorrhage, with subarachnoid hemorrhage as well adjacent to it. Patient is on eliquis for atrial fibrillation. He has a history of metastatic lung cancer, recent pneumonia.   After the ER physician from [facility] discussed his case with me for transfer, he was given [facility] for reversible. Decadron 10 mg one dose is also given as a differential includes possible to metastatic lesion given his metastatic lung cancer history. No history of seizures prior to this.    Date last known well:  2017,  June 4th Time last known well:  At 2 am tPA Given: No: ICH    Past Medical History: Past Medical History  Diagnosis Date  . Coronary artery disease   . Pneumonia 2000  . Arthritis   . 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 Bruises easily   . GERD (gastroesophageal reflux disease)     takes Omeprazole daily as needed for stomach pain  . Blood transfusion 2001  . Diabetes mellitus     takes Metformin daily;  . Impaired hearing     bil hearing aide  . Macular degeneration     being watched for this but hasn't been "completely" diagnosed  . Myocardial infarction ([place]) 2001  . Cancer [facility])     Past Surgical History  Procedure Laterality Date  . 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 Cardiac catheterization  2001  . Tonsillectomy      as a child  .  Colonoscopy    . Cataract extraction      bilateral  . Lumbar laminectomy/decompression microdiscectomy  12/19/2010    Procedure: LUMBAR LAMINECTOMY/DECOMPRESSION MICRODISCECTOMY;  Surgeon: Elaina Hoops;  Location: [place] [facility];  Service: Neurosurgery;  Laterality: N/A;  Lumbar three-four, four-five decompressive lumbar laminectomy  . Joint replacement  right tkr  . Eye surgery      cataracts  . Back surgery      l4 5  . Portacath placement N/A 05/20/2014    Procedure: INSERTION PORT-A-CATH;  Surgeon: Nestor Lewandowsky, MD;  Location: [facility] ORS;  Service: General;  Laterality: N/A;    Family History: Family History  Problem Relation Age of Onset  .  Anesthesia problems Neg Hx   . Hypotension Neg Hx   . Malignant hyperthermia Neg Hx   . Pseudochol deficiency Neg Hx   . Diabetes type II Other     Social History:   reports that he quit smoking about 16 years ago. His smoking use included Cigarettes. He quit after 40 years of use. He has never used smokeless tobacco. He reports that he does not drink alcohol or use illicit drugs.  Allergies:  No Known Allergies   Medications:                                                                                                                         Current facility-administered medications:  .   stroke: mapping our early stages of recovery book, , Does not apply, Once, Joni Norrod Fuller Mandril, MD .  acetaminophen (TYLENOL) tablet 650 mg, 650 mg, Oral, Q4H PRN **OR** acetaminophen (TYLENOL) suppository 650 mg, 650 mg, Rectal, Q4H PRN, Ilani Otterson Fuller Mandril, MD .  labetalol (NORMODYNE,TRANDATE) injection 10-40 mg, 10-40 mg, Intravenous, Q10 min PRN, Olin Gurski Fuller Mandril, MD .  niMODipine (NIMOTOP) capsule 30 mg, 30 mg, Oral, Q6H, Arleth Mccullar Fuller Mandril, MD .  pantoprazole (PROTONIX) EC tablet 40 mg, 40 mg, Oral, Daily, Shanyiah Conde Fuller Mandril, MD .  senna-docusate (Senokot-S) tablet 1 tablet, 1 tablet, Oral, BID, Dariel Betzer  Fuller Mandril, MD  Facility-Administered Medications Ordered in Other Encounters:  .  0.9 %  sodium chloride infusion, , Intravenous, Continuous, Forest Gleason, MD, Stopped at 12/11/14 1034 .  lidocaine-prilocaine (EMLA) cream, , Topical, Once, Evlyn Kanner, NP .  sodium chloride 0.9 % injection 10 mL, 10 mL, Intracatheter, PRN, Forest Gleason, MD, 10 mL at 06/03/14 1106 .  sodium chloride 0.9 % injection 10 mL, 10 mL, Intravenous, PRN, Forest Gleason, MD, 10 mL at 07/30/14 0920 .  sodium chloride 0.9 % injection 10 mL, 10 mL, Intracatheter, PRN, Forest Gleason, MD, 10 mL at 12/11/14 0912 .  sodium chloride flush (NS) 0.9 % injection 10 mL, 10 mL, Intravenous, PRN, Forest Gleason, MD, 10 mL at 05/05/15 0848   ROS:                                                                                                                                       History obtained from the patient  General ROS: negative for -  chills, fatigue, fever, night sweats, weight gain or weight loss Psychological ROS: negative for - behavioral disorder, hallucinations, memory difficulties, mood swings or suicidal ideation Ophthalmic ROS: negative for - blurry vision, double vision, eye pain or loss of vision ENT ROS: negative for - epistaxis, nasal discharge, oral lesions, sore throat, tinnitus or vertigo Allergy and Immunology ROS: negative for - hives or itchy/watery eyes Hematological and Lymphatic ROS: negative for - bleeding problems, bruising or swollen lymph nodes Endocrine ROS: negative for - galactorrhea, hair pattern changes, polydipsia/polyuria or temperature intolerance Respiratory ROS: Positive for - cough, hemoptysis, shortness of breath ,  wheezing Cardiovascular ROS: negative for - chest pain, dyspnea on exertion, edema or irregular heartbeat Gastrointestinal ROS: negative for - abdominal pain, diarrhea, hematemesis, nausea/vomiting or stool incontinence Genito-Urinary ROS: negative for - dysuria,  hematuria, incontinence or urinary frequency/urgency Musculoskeletal ROS: negative for - joint swelling or muscular weakness Neurological ROS: as noted in HPI Dermatological ROS: negative for rash and skin lesion changes  Neurologic Examination:                                                                                                    Today's Vitals   06/13/15 1500  BP: 143/66  Pulse: 85  Temp: 98.4 F (36.9 C)  TempSrc: Oral  Resp: 23  Height: 6' (1.829 m)  Weight: 94.5 kg (208 lb 5.4 oz)  SpO2: 96%    Evaluation of higher integrative functions including: Level of alertness: Alert,  Oriented to time, place and person Speech: fluent, no evidence of dysarthria or aphasia noted.  Test the following cranial nerves: 2-12 grossly intact Motor examination: Complete right hemiplegia in the upper and lower extremity is with no voluntary motor movement, full 5/5 motor strength in left upper and lower extremities Examination of sensation :symmetric sensation to pinprick in all 4 extremities and on face Examination of deep tendon reflexes:  symmetric in all extremities, positive Babinski on the right , normal plantar on the left Test coordination: Normal finger nose testing with left upper extremity, no abnormal involuntary movements or tremors noted.  Gait: Deferred   Lab Results: Basic Metabolic Panel:  Recent Labs Lab 06/13/15 0934  NA 137  K 4.6  CL 103  CO2 23  GLUCOSE 139*  BUN 20  CREATININE 1.34*  CALCIUM 9.3    Liver Function Tests:  Recent Labs Lab 06/13/15 0934  AST 18  ALT 15*  ALKPHOS 74  BILITOT 0.4  PROT 7.6  ALBUMIN 3.7   No results for input(s): LIPASE, AMYLASE in the last 168 hours. No results for input(s): AMMONIA in the last 168 hours.  CBC:  Recent Labs Lab 06/13/15 0934  WBC 5.9  NEUTROABS 4.6  HGB 8.9*  HCT 26.8*  MCV 86.0  PLT 234    Cardiac Enzymes:  Recent Labs Lab 06/13/15 0934  TROPONINI <0.03    Lipid  Panel: No results for input(s): CHOL, TRIG, HDL, CHOLHDL, VLDL, LDLCALC in the last 168 hours.  CBG:  Recent Labs Lab 06/13/15 0931 06/13/15 1205  GLUCAP 131* 116*  Microbiology: Results for orders placed or performed during the hospital encounter of 04/17/15  Blood Culture (routine x 2)     Status: Abnormal   Collection Time: 04/17/15 12:05 PM  Result Value Ref Range Status   Specimen Description BLOOD RIGHT CHEST  Final   Special Requests BOTTLES DRAWN AEROBIC AND ANAEROBIC  3CC  Final   Culture  Setup Time   Final    GRAM NEGATIVE RODS IN BOTH AEROBIC AND ANAEROBIC BOTTLES CRITICAL RESULT CALLED TO, READ BACK BY AND VERIFIED WITH: Sheema Hallaji @ [id number] 04/18/15 by [facility]    Culture (A)  Final    HAEMOPHILUS INFLUENZAE IN BOTH AEROBIC AND ANAEROBIC BOTTLES BETA LACTAMASE NEGATIVE HAEMOPHILUS  INFLUENZAE TYPE F BIOTYPE I TYPING PERFORMED AT [facility]    Report Status 05/19/2015 FINAL  Final  Blood Culture ID Panel (Reflexed)     Status: Abnormal   Collection Time: 04/17/15 12:05 PM  Result Value Ref Range Status   Enterococcus species NOT DETECTED NOT DETECTED Final   Vancomycin resistance NOT DETECTED NOT DETECTED Final   Listeria monocytogenes NOT DETECTED NOT DETECTED Final   Staphylococcus species NOT DETECTED NOT DETECTED Final   Staphylococcus aureus NOT DETECTED NOT DETECTED Final   Methicillin resistance NOT DETECTED NOT DETECTED Final   Streptococcus species NOT DETECTED NOT DETECTED Final   Streptococcus agalactiae NOT DETECTED NOT DETECTED Final   Streptococcus pneumoniae NOT DETECTED NOT DETECTED Final   Streptococcus pyogenes NOT DETECTED NOT DETECTED Final   Acinetobacter baumannii NOT DETECTED NOT DETECTED Final   Enterobacteriaceae species NOT DETECTED NOT DETECTED Final   Enterobacter cloacae complex NOT DETECTED NOT DETECTED Final   Escherichia coli NOT DETECTED NOT DETECTED Final   Klebsiella oxytoca NOT DETECTED NOT DETECTED  Final   Klebsiella pneumoniae NOT DETECTED NOT DETECTED Final   Proteus species NOT DETECTED NOT DETECTED Final   Serratia marcescens NOT DETECTED NOT DETECTED Final   Carbapenem resistance NOT DETECTED NOT DETECTED Final   Haemophilus influenzae DETECTED (A) NOT DETECTED Final    Comment: CRITICAL RESULT CALLED TO, READ BACK BY AND VERIFIED WITH: Sheema Hallaji @ [id number] 04/18/15 by [place]    Neisseria meningitidis NOT DETECTED NOT DETECTED Final   Pseudomonas aeruginosa NOT DETECTED NOT DETECTED Final   Candida albicans NOT DETECTED NOT DETECTED Final   Candida glabrata NOT DETECTED NOT DETECTED Final   Candida krusei NOT DETECTED NOT DETECTED Final   Candida parapsilosis NOT DETECTED NOT DETECTED Final   Candida tropicalis NOT DETECTED NOT DETECTED Final  Blood Culture (routine x 2)     Status: None   Collection Time: 04/17/15  1:54 PM  Result Value Ref Range Status   Specimen Description BLOOD LEFT HAND  Final   Special Requests BOTTLES DRAWN AEROBIC AND ANAEROBIC  1CC  Final   Culture NO GROWTH 5 DAYS  Final   Report Status 04/22/2015 FINAL  Final  MRSA PCR Screening     Status: None   Collection Time: 04/17/15  2:19 PM  Result Value Ref Range Status   MRSA by PCR NEGATIVE NEGATIVE Final    Comment:        The GeneXpert MRSA Assay (FDA approved for NASAL specimens only), is one component of a comprehensive MRSA colonization surveillance program. It is not intended to diagnose MRSA infection nor to guide or monitor treatment for MRSA infections.   Urine culture     Status: None   Collection Time: 04/17/15  7:29 PM  Result Value Ref Range Status   Specimen Description URINE, RANDOM  Final   Special Requests NONE  Final   Culture NO GROWTH 1 DAY  Final   Report Status 04/19/2015 FINAL  Final     Imaging: Ct Head Wo Contrast  06/13/2015  CLINICAL DATA:  The woke at 5 a.m. with right-sided weakness. EXAM: CT HEAD WITHOUT CONTRAST TECHNIQUE: Contiguous axial images were  obtained from the base of the skull through the vertex without intravenous contrast. COMPARISON:  None. FINDINGS: There is intra cerebral hemorrhage noted in the posterior left frontal lobe. Subarachnoid extension. Hemorrhage measures 2.4 x 2.2 x 1.6 cm for a volume of 4.2 mL. Mild surrounding vasogenic edema. No significant mass effect. No midline shift. No hydrocephalus. No acute calvarial abnormality. Air-fluid level in the left sphenoid sinus. Mastoid air cells are clear. IMPRESSION: Left posterior frontal intra cerebral hemorrhage with subarachnoid extension as described above. Critical Value/emergent results were called by telephone at the time of interpretation on 06/13/2015 at 10:02 am to Dr. Loura Pardon , who verbally acknowledged these results. Electronically Signed   By: Rolm Baptise M.D.   On: 06/13/2015 10:04     Stroke Scales   NIHSS :  6  ICH Score:  0   Assessment and plan:   Ivan Beasley is an 75 y.o. male patient with metastatic lung cancer history, on eliquis for atrial fibrillation, presented with acute right hemiplegia, noticed at 4 AM this morning, CT of the head showing a left frontal hemorrhage, likely secondary to anticoagulation. Given his metastatic lung cancer history differential also includes the possibility of an underlying metastatic lesion. Reversal agent[facility] Kcent[facility], Decadron 10 mg initial dose given, we'll continue 4 mg every 6 hours. During my initial evaluation after [facility], patient's deficits are limited to right hemiplegia, normal mental status, no seizure-like activity.  At about 6 PM, received a call from ICU nurse that the patient has been having brief episodes of asystole associated with left upper and lower extremity convulsive activity, right head turn and gaze deviation. Each of these episodes lasted about 10-20 seconds. She noticed a total of 4 such episodes within a span of a few minutes. After I  arrived to evaluate the patient, I witnessed another episode similar to this, associated with asystole. Patient's heart rate was immediately back to normal after the seizure has resolved.  He is full code at this time.  Given the frequent episodes of asystole associated with seizures within a short span, which are increasing in frequency over the last few minutes, with poor baseline pulmonary function which could worsen with the use of seizure medications causing sedation, I recommend intubation for airway protection. Discussed this with the patient and his wife and they both agree to proceed with that. Ativan 2 mg given, followed by stat fosphenytoin 15 mg/kg. After intubation, will start propofol infusion.  EEG ordered. MRI of the brain with and without contrast ordered, pending.  For maintenance therapy, will start Keppra 1500 mg twice a day after intubation. Discussed the case with critical care attending, Dr. Vaughan Browner.    Neurology stroke team will continue to follow starting tomorrow morning.

## 2015-06-13 NOTE — Procedures (Deleted)
Intubation Procedure Note Ivan Beasley [id number] 06/03/1940  Procedure: Intubation Indications: Airway protection and maintenance  Procedure Details Consent: Risks of procedure as well as the alternatives and risks of each were explained to the (patient/caregiver).  Consent for procedure obtained. Time Out: Verified patient identification, verified procedure, site/side was marked, verified correct patient position, special equipment/implants available, medications/allergies/relevent history reviewed, required imaging and test results available.  Performed  Maximum sterile technique was used including antiseptics, gloves and hand hygiene.  MAC and 4    Evaluation Hemodynamic Status: BP stable throughout; O2 sats: stable throughout Patient's Current Condition: stable Complications: No apparent complications Patient did tolerate procedure well. Chest X-ray ordered to verify placement.  CXR: pending.  Intubated by ICU physician.   Ivan Beasley 06/13/2015

## 2015-06-13 NOTE — ED Provider Notes (Signed)
[facility] Emergency Department Provider Note   ____________________________________________  Time seen: Approximately 9:44 AM  I have reviewed the triage vital signs and the nursing notes.   HISTORY  Chief Complaint Stroke Symptoms    HPI Ivan Beasley is a 75 y.o. 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 No chest pain or difficulty breathing. No vomiting, diarrhea, fevers or chills.   Past Medical History  Diagnosis Date  . Coronary artery disease   . Pneumonia 2000  . Arthritis   . 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 Bruises easily   . GERD (gastroesophageal reflux disease)     takes Omeprazole daily as needed for stomach pain  . Blood transfusion 2001  . Diabetes mellitus     takes Metformin daily;  . Impaired hearing     bil hearing aide  . Macular degeneration     being watched for this but hasn't been "completely" diagnosed  . Myocardial infarction ([place]) 2001  . Cancer [facility])     Patient Active Problem List   Diagnosis Date Noted  . ICH (intracerebral hemorrhage) ([place]) 06/13/2015  . Paroxysmal atrial fibrillation ([place]) 05/13/2015  . PNA (pneumonia) 05/13/2015  . HCAP (healthcare-associated pneumonia)   . Pneumonitis   . Acute respiratory failure ([place])   . Acute on chronic respiratory failure with hypoxia ([place]) 04/17/2015  . Respiratory failure ([place]) 04/17/2015  . Cancer of lung ([place]) 05/17/2014  . COPD, mild ([place]) 08/29/2013  . Cough, persistent 08/29/2013  . Mild chronic obstructive pulmonary disease ([place]) 08/29/2013  . H/O cardiac catheterization 07/29/2013  . Breath shortness 06/23/2013  . Chest pain 06/23/2013  . BP (high blood pressure) 06/20/2013  . Diabetes ([place])  06/20/2013  . HLD (hyperlipidemia) 06/20/2013  . H/O coronary artery bypass surgery 06/20/2013  . Diabetes mellitus ([place]) 06/20/2013  . H/O total knee replacement 06/03/2013    Past Surgical History  Procedure Laterality Date  . 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 Cardiac catheterization  2001  . Tonsillectomy      as a child  . Colonoscopy    . Cataract extraction      bilateral  . Lumbar laminectomy/decompression microdiscectomy  12/19/2010    Procedure: LUMBAR LAMINECTOMY/DECOMPRESSION MICRODISCECTOMY;  Surgeon: Elaina Hoops;  Location: [place] [facility] ORS;  Service: Neurosurgery;  Laterality: N/A;  Lumbar three-four, four-five decompressive lumbar laminectomy  . Joint replacement  right tkr  . Eye surgery      cataracts  . Back surgery      l4 5  . Portacath placement N/A 05/20/2014    Procedure: INSERTION PORT-A-CATH;  Surgeon: Nestor Lewandowsky, MD;  Location: [facility] ORS;  Service: General;  Laterality: N/A;    No current outpatient prescriptions on file.  Allergies Review of patient's allergies indicates no known allergies.  Family History  Problem Relation Age of Onset  . Anesthesia problems Neg Hx   . Hypotension Neg Hx   . Malignant hyperthermia Neg Hx   . Pseudochol deficiency Neg Hx   . Diabetes type II Other     Social History Social History  Substance Use Topics  . Smoking status: Former Smoker -- 40 years    Types: Cigarettes  Quit date: 05/19/1999  . Smokeless tobacco: Never Used     Comment: 2001  . Alcohol Use: No    Review of Systems Constitutional: No fever/chills Eyes: No visual changes. ENT: No sore throat. Cardiovascular: Denies chest pain. Respiratory: Denies shortness of breath. Gastrointestinal: No abdominal pain.  No nausea, no vomiting.  No diarrhea.  No constipation. Genitourinary: Negative for dysuria. Musculoskeletal: Negative for back pain. Skin: Negative for rash. Neurological: Negative for headaches, +focal weakness, no  numbness.  10-point ROS otherwise negative.  ____________________________________________   PHYSICAL EXAM:  VITAL SIGNS: ED Triage Vitals  Enc Vitals Group     BP 06/13/15 0929 138/69 mmHg     Pulse Rate 06/13/15 0929 84     Resp 06/13/15 0929 20     Temp 06/13/15 0929 98.2 F (36.8 C)     Temp Source 06/13/15 0929 Oral     SpO2 06/13/15 0925 97 %     Weight 06/13/15 0929 214 lb 1.1 oz (97.1 kg)     Height 06/13/15 0929 6' (1.829 m)     Head Cir --      Peak Flow --      Pain Score 06/13/15 0930 0     Pain Loc --      Pain Edu? --      Excl. in [place]? --     Constitutional: Alert and oriented. Well appearing and in no acute distress. Eyes: Conjunctivae are normal. PERRL. EOMI. Head: Atraumatic. Nose: No congestion/rhinnorhea. Mouth/Throat: Mucous membranes are moist.  Oropharynx non-erythematous. Neck: No stridor. Supple without meningismus. Cardiovascular: Normal rate, regular rhythm. Grossly normal heart sounds.  Good peripheral circulation. Respiratory: Normal respiratory effort.  No retractions. Lungs CTAB. Gastrointestinal: Soft and nontender. No distention. No CVA tenderness. Genitourinary: Deferred Musculoskeletal: No lower extremity tenderness nor edema.  No joint effusions. Neurologic:  Normal speech and language. No dysarthria or aphasia. Mild right lower facial droop but symmetric smile. 4/5 strength in the right upper extremity, 2+/5 strength in the right lower extremity, 5 out of 5 strength in the left upper and left lower extremities, sensation intact to light touch throughout. Cranial nerves II through XII intact. Skin:  Skin is warm, dry and intact. No rash noted. Psychiatric: Mood and affect are normal. Speech and behavior are normal.  ____________________________________________   LABS (all labs ordered are listed, but only abnormal results are displayed)  Labs Reviewed  PROTIME-INR - Abnormal; Notable for the following:    Prothrombin Time 21.2 (*)      All other components within normal limits  APTT - Abnormal; Notable for the following:    aPTT 37 (*)    All other components within normal limits  CBC - Abnormal; Notable for the following:    RBC 3.11 (*)    Hemoglobin 8.9 (*)    HCT 26.8 (*)    RDW 20.8 (*)    All other components within normal limits  DIFFERENTIAL - Abnormal; Notable for the following:    Lymphs Abs 0.6 (*)    All other components within normal limits  COMPREHENSIVE METABOLIC PANEL - Abnormal; Notable for the following:    Glucose, Bld 139 (*)    Creatinine, Ser 1.34 (*)    ALT 15 (*)    GFR calc non Af Amer 50 (*)    GFR calc Af Amer 58 (*)    All other components within normal limits  GLUCOSE, CAPILLARY - Abnormal; Notable for the following:    Glucose-Capillary 131 (*)  All other components within normal limits  URINALYSIS COMPLETEWITH MICROSCOPIC ([facility] ONLY) - Abnormal; Notable for the following:    Color, Urine STRAW (*)    APPearance CLEAR (*)    All other components within normal limits  GLUCOSE, CAPILLARY - Abnormal; Notable for the following:    Glucose-Capillary 116 (*)    All other components within normal limits  TROPONIN I  PROTIME-INR  CBG MONITORING, ED  TYPE AND SCREEN   ____________________________________________  EKG  ED ECG REPORT I, Joanne Gavel, the attending physician, personally viewed and interpreted this ECG.   Date: 06/13/2015  EKG Time: 09:29  Rate: 83  Rhythm: sinus rhythmWith first-degree AV block.  Axis: normal  Intervals: Nonspecific intraventricular conduction delay.  ST&T Change: No acute ST elevation.  ____________________________________________  RADIOLOGY  CT head  IMPRESSION: Left posterior frontal intra cerebral hemorrhage with subarachnoid extension as described above.  Critical Value/emergent results were called by telephone at the time of interpretation on 06/13/2015 at 10:02 am to Dr. Loura Pardon , who verbally acknowledged these  results.  ____________________________________________   PROCEDURES  Procedure(s) performed: None  Critical Care performed: Yes. Total critical care time spent 35 minutes.  CRITICAL CARE Performed by: Loura Pardon A   Total critical care time: 35 minutes  Critical care time was exclusive of separately billable procedures and treating other patients.  Critical care was necessary to treat or prevent imminent or life-threatening deterioration.  Critical care was time spent personally by me on the following activities: development of treatment plan with patient and/or surrogate as well as nursing, discussions with consultants, evaluation of patient's response to treatment, examination of patient, obtaining history from patient or surrogate, ordering and performing treatments and interventions, ordering and review of laboratory studies, ordering and review of radiographic studies, pulse oximetry and re-evaluation of patient's condition.  ____________________________________________   INITIAL IMPRESSION / ASSESSMENT AND PLAN / ED COURSE  Pertinent labs & imaging results that were available during my care of the patient were reviewed by me and considered in my medical decision making (see chart for details).  TYRIC RODEHEAVER is a 75 y.o. 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 On arrival to the emergency department, he is awake, alert, oriented, in no acute distress. He does have an NIH stroke scale of 5 with notable right-sided weakness. CT scan of his head is concerning for left posterior frontal intracerebral hemorrhage with subarachnoid extension and the concern is for hemorrhagic CVA versus metastasis. We'll discussed with [facility] and neurosurgery and anticipate  transfer.  ----------------------------------------- 10:44 AM on 06/13/2015 ----------------------------------------- Labs reviewed and are notable for mild chronic anemia. Mild chronic creatinine elevation is noted but is unchanged from prior. Negative troponin. I discussed the case with Dr. Ellene Route of [facility] neurosurgery who recommends admission to the neurology service and he will evaluate the patient when he arrives to [facility]. I discussed the case with Dr. Silverio Decamp, on call for neurology who has accepted the patient as transfer. We discussed reversal agents and he is ordering K-centra for reversal.  ____________________________________________   FINAL CLINICAL IMPRESSION(S) / ED DIAGNOSES  Final diagnoses:  Right sided weakness  Hemorrhagic cerebrovascular accident (CVA) ([place])      NEW MEDICATIONS STARTED DURING THIS VISIT:  Discharge Medication List as of 06/13/2015  2:34 PM  Note:  This document was prepared using Dragon voice recognition software and may include unintentional dictation errors.    Joanne Gavel, MD 06/13/15 [phone]

## 2015-06-13 NOTE — Procedures (Signed)
Intubation Procedure Note JOSHAU CODE [id number] 11-Apr-1940  Procedure: Intubation Indications: Airway protection and maintenance  Procedure Details Consent: Risks of procedure as well as the alternatives and risks of each were explained to the (patient/caregiver).  Consent for procedure obtained. Time Out: Verified patient identification, verified procedure, site/side was marked, verified correct patient position, special equipment/implants available, medications/allergies/relevent history reviewed, required imaging and test results available.  Performed  Sabra Heck and 3   Evaluation Hemodynamic Status: BP stable throughout; O2 sats: transiently fell during during procedure Patient's Current Condition: stable Complications: No apparent complications Patient did tolerate procedure well. Chest X-ray ordered to verify placement.  CXR: pending.   Halsey Hammen 06/13/2015

## 2015-06-14 ENCOUNTER — Inpatient Hospital Stay (HOSPITAL_COMMUNITY): Payer: Medicare HMO

## 2015-06-14 DIAGNOSIS — E785 Hyperlipidemia, unspecified: Secondary | ICD-10-CM

## 2015-06-14 DIAGNOSIS — I10 Essential (primary) hypertension: Secondary | ICD-10-CM

## 2015-06-14 DIAGNOSIS — I480 Paroxysmal atrial fibrillation: Secondary | ICD-10-CM

## 2015-06-14 DIAGNOSIS — I639 Cerebral infarction, unspecified: Secondary | ICD-10-CM

## 2015-06-14 DIAGNOSIS — C3432 Malignant neoplasm of lower lobe, left bronchus or lung: Secondary | ICD-10-CM

## 2015-06-14 DIAGNOSIS — R569 Unspecified convulsions: Secondary | ICD-10-CM

## 2015-06-14 DIAGNOSIS — J9601 Acute respiratory failure with hypoxia: Secondary | ICD-10-CM

## 2015-06-14 LAB — GLUCOSE, CAPILLARY
GLUCOSE-CAPILLARY: 305 mg/dL — AB (ref 65–99)
GLUCOSE-CAPILLARY: 282 mg/dL — AB (ref 65–99)
GLUCOSE-CAPILLARY: 211 mg/dL — AB (ref 65–99)
GLUCOSE-CAPILLARY: 182 mg/dL — AB (ref 65–99)
Glucose-Capillary: 269 mg/dL — ABNORMAL HIGH (ref 65–99)

## 2015-06-14 LAB — CBC
HEMATOCRIT: 26.7 % — AB (ref 39.0–52.0)
Hemoglobin: 8.1 g/dL — ABNORMAL LOW (ref 13.0–17.0)
MCH: 26.5 pg (ref 26.0–34.0)
MCHC: 30.3 g/dL (ref 30.0–36.0)
MCV: 87.3 fL (ref 78.0–100.0)
Platelets: 262 10*3/uL (ref 150–400)
RBC: 3.06 MIL/uL — ABNORMAL LOW (ref 4.22–5.81)
RDW: 19 % — AB (ref 11.5–15.5)
WBC: 12.1 10*3/uL — ABNORMAL HIGH (ref 4.0–10.5)

## 2015-06-14 LAB — BLOOD GAS, ARTERIAL
ACID-BASE DEFICIT: 6.5 mmol/L — AB (ref 0.0–2.0)
BICARBONATE: 17.8 meq/L — AB (ref 20.0–24.0)
Drawn by: 42624
FIO2: 0.4
LHR: 16 {breaths}/min
O2 Saturation: 98.1 %
PATIENT TEMPERATURE: 98.6
PCO2 ART: 31.6 mmHg — AB (ref 35.0–45.0)
PEEP/CPAP: 5 cmH2O
TCO2: 18.7 mmol/L (ref 0–100)
VT: 620 mL
pH, Arterial: 7.369 (ref 7.350–7.450)
pO2, Arterial: 112 mmHg — ABNORMAL HIGH (ref 80.0–100.0)

## 2015-06-14 LAB — LIPID PANEL
CHOL/HDL RATIO: 4.1 ratio
CHOLESTEROL: 206 mg/dL — AB (ref 0–200)
HDL: 50 mg/dL (ref >40)
LDL Cholesterol: 145 mg/dL — ABNORMAL HIGH (ref 0–99)
TRIGLYCERIDES: 54 mg/dL (ref <150)
VLDL: 11 mg/dL (ref 0–40)

## 2015-06-14 LAB — MAGNESIUM: Magnesium: 1.7 mg/dL (ref 1.7–2.4)

## 2015-06-14 LAB — BASIC METABOLIC PANEL
ANION GAP: 11 (ref 5–15)
BUN: 14 mg/dL (ref 6–20)
CO2: 19 mmol/L — AB (ref 22–32)
Calcium: 8.8 mg/dL — ABNORMAL LOW (ref 8.9–10.3)
Chloride: 102 mmol/L (ref 101–111)
Creatinine, Ser: 1.64 mg/dL — ABNORMAL HIGH (ref 0.61–1.24)
GFR calc Af Amer: 46 mL/min — ABNORMAL LOW (ref >60)
GFR calc non Af Amer: 39 mL/min — ABNORMAL LOW (ref >60)
GLUCOSE: 291 mg/dL — AB (ref 65–99)
POTASSIUM: 4.7 mmol/L (ref 3.5–5.1)
Sodium: 132 mmol/L — ABNORMAL LOW (ref 135–145)

## 2015-06-14 LAB — PHOSPHORUS: Phosphorus: 2.3 mg/dL — ABNORMAL LOW (ref 2.5–4.6)

## 2015-06-14 MED ORDER — GADOBENATE DIMEGLUMINE 529 MG/ML IV SOLN: 20.0000 mL | Freq: Once | INTRAVENOUS | Status: DC | PRN

## 2015-06-14 MED ORDER — PANTOPRAZOLE SODIUM 40 MG PO PACK
40.0000 mg | PACK | Freq: Every day | ORAL | Status: DC
  Administered 2015-06-14: 40 mg

## 2015-06-14 MED ORDER — ATROPINE SULFATE 1 MG/10ML IJ SOSY
PREFILLED_SYRINGE | INTRAMUSCULAR | Status: AC
  Filled 2015-06-14: qty 10

## 2015-06-14 MED ORDER — MIDAZOLAM HCL 2 MG/2ML IJ SOLN
INTRAMUSCULAR | Status: AC
  Filled 2015-06-14: qty 2

## 2015-06-14 MED ORDER — MAGNESIUM SULFATE 2 GM/50ML IV SOLN
2.0000 g | Freq: Once | INTRAVENOUS | Status: AC
  Administered 2015-06-14: 2 g via INTRAVENOUS
  Filled 2015-06-14: qty 50

## 2015-06-14 MED ORDER — SODIUM PHOSPHATES 45 MMOLE/15ML IV SOLN
20.0000 mmol | Freq: Once | INTRAVENOUS | Status: AC
  Administered 2015-06-14: 20 mmol via INTRAVENOUS
  Filled 2015-06-14: qty 6.67

## 2015-06-14 NOTE — Progress Notes (Signed)
Inpatient Diabetes Program Recommendations  AACE/ADA: New Consensus Statement on Inpatient Glycemic Control (2015)  Target Ranges:  Prepandial:   less than 140 mg/dL      Peak postprandial:   less than 180 mg/dL (1-2 hours)      Critically ill patients:  140 - 180 mg/dL  Results for Ivan Beasley, Ivan Beasley (MRN [id number]) as of 06/14/2015 12:40  Ref. Range 06/13/2015 21:38 06/13/2015 23:14 06/14/2015 03:12 06/14/2015 07:43 06/14/2015 11:27  Glucose-Capillary Latest Ref Range: 65-99 mg/dL 331 (H) 321 (H) 305 (H) 269 (H) 282 (H)   Review of Glycemic Control  Diabetes history: DM 2 Outpatient Diabetes medications: Amaryl 1 mg q d + Metformin 1 gm am & 500 mg pm Current orders for Inpatient glycemic control: Novolog correction 0-24 units q 4 hrs.  Inpatient Diabetes Program Recommendations:  Noted elevated CBGs with steroids.  Please discontinue current Novolog correction 0-24 units q 4 hrs and start on ICU Glycemic Control Protocol using Novoloh 2, 4, 6 units correction and start basal insulin Lantus insulin 20 units q d (0.2 units/kg).  Thank you, Nani Gasser. Chauntel Windsor, RN, MSN, CDE Inpatient Glycemic Control Team Team Pager [phone] (8am-5pm) 06/14/2015 12:47 PM

## 2015-06-14 NOTE — Progress Notes (Signed)
PT Cancellation Note  Patient Details Name: Ivan Beasley MRN: [id number] DOB: 22-Jul-1940   Cancelled Treatment:    Reason Eval/Treat Not Completed: Patient not medically ready. Spoke with nsg, patient not appropriate for therapies at this time.   Duncan Dull 06/14/2015, 7:37 AM Alben Deeds, PT DPT  [phone]

## 2015-06-14 NOTE — Progress Notes (Signed)
   06/14/15 1022  Clinical Encounter Type  Visited With Patient and family together;Health care provider  Visit Type Initial;Critical Care;Spiritual support  Referral From Family;Nurse  Spiritual Encounters  Spiritual Needs Prayer;Ritual;Emotional  Stress Factors  Family Stress Factors Health changes   Chaplain responded to a request for a Catholic priest for the patient. Chaplain met with patient's wife. Patient has not been actively participating in a parish, but has been talking about needing a priest for the past few months. [name] seems a bit conflicted about the timing of the priest visiting. If possible, the wife would like the priest to visit once the patient gets better and more alert/communicative. If the prognosis is not optimistic, then we'll go ahead and get a request a priest. Ivan Beasley will consult with medical team and follow-up. Spiritual care services available as needed.   Ivan Beasley, Chaplain 06/14/2015 10:26 AM

## 2015-06-14 NOTE — Progress Notes (Signed)
[facility] Progress Note Patient Name: ZAKEE DEERMAN DOB: 1940-08-12 MRN: [id number]   Date of Service  06/14/2015  HPI/Events of Note  Phos and Mg low. Wide qrs  eICU Interventions  Replace phos and Mg.      Intervention Category Intermediate Interventions: Electrolyte abnormality - evaluation and management  [place] 06/14/2015, 4:12 AM

## 2015-06-14 NOTE — Progress Notes (Signed)
*  Preliminary Results* Bilateral lower extremity venous duplex completed. Bilateral lower extremities are negative for deep vein thrombosis. There is no evidence of Baker's cyst bilaterally.  06/14/2015  Maudry Mayhew, RVT, RDCS, RDMS

## 2015-06-14 NOTE — Progress Notes (Signed)
Patient became bradycardic and hypotensive despite Neo. Propofol was turned off and CCM was paged. After a few minutes the patient's heart rate returned to sinus rhythm. EKG done. Propofol restarted at a lower dose. Will continue to monitor. Neurology notified. Thayer Ohm D

## 2015-06-14 NOTE — Progress Notes (Signed)
[place] and HME changed to obtain a sputum sample.

## 2015-06-14 NOTE — Progress Notes (Signed)
STROKE TEAM PROGRESS NOTE   HISTORY OF PRESENT ILLNESS (per record) Ivan Beasley is an 75 y.o. male patient who is transferred from [facility] for further neurological care. He went to bed last night normal at 9 PM. He has been making most of the night almost every hour per history. He remembers being normal roughly around 2 AM or so. He woke up again at 4 AM, noted right-sided hemi-plegia. (LKW 06/13/2015 at 0200) He was taken to [facility] emergency room, where a CT of the head revealed left frontal intracerebral hemorrhage, with subarachnoid hemorrhage as well adjacent to it. Patient is on eliquis for atrial fibrillation. He has a history of metastatic lung cancer, recent pneumonia. After the ER physician from [facility] discussed his case with me for transfer, he was given [facility] for reversible. Decadron 10 mg one dose is also given as a differential includes possible to metastatic lesion given his metastatic lung cancer history. No history of seizures prior to this. Patient was not administered IV t-PA secondary to [place]. He was transferred to [facility] and admitted to the neuro ICU for further evaluation and treatment. NIHSS 6. ICH score 0.    SUBJECTIVE (INTERVAL HISTORY) Pt wife is at bedside, stated that pt found to have lung cancer in 04/2014, underwent chemo and radiation. So far no metastasis except spread to surrounding lymph nodes x 2. Currently stage III. Has respiratory failure with low O2 one month ago and found to have afib at that time and then converted to sinus rhythm. Pt cardiologist Dr. Clayborn Bigness would like him to continue eliquis for one month and then re-evaluate. Suppose to see Dr. Clayborn Bigness tomorrow for evaluation.    OBJECTIVE Temp:  [98 F (36.7 C)-98.4 F (36.9 C)] 98 F (36.7 C) (06/05 0400) Pulse Rate:  [38-91] 65 (06/05 0700) Cardiac Rhythm:  [-] Normal sinus rhythm;Heart block (06/05 0400) Resp:  [16-23] 16 (06/05  0600) BP: (72-150)/(45-81) 136/67 mmHg (06/05 0700) SpO2:  [93 %-100 %] 100 % (06/05 0700) FiO2 (%):  [40 %-100 %] 40 % (06/05 0400) Weight:  [94.5 kg (208 lb 5.4 oz)-97.1 kg (214 lb 1.1 oz)] 94.5 kg (208 lb 5.4 oz) (06/04 1500)  CBC:  Recent Labs Lab 06/13/15 0934 06/14/15 0215  WBC 5.9 12.1*  NEUTROABS 4.6  --   HGB 8.9* 8.1*  HCT 26.8* 26.7*  MCV 86.0 87.3  PLT 234 841    Basic Metabolic Panel:  Recent Labs Lab 06/13/15 0934 06/14/15 0215  NA 137 132*  K 4.6 4.7  CL 103 102  CO2 23 19*  GLUCOSE 139* 291*  BUN 20 14  CREATININE 1.34* 1.64*  CALCIUM 9.3 8.8*  MG  --  1.7  PHOS  --  2.3*    Lipid Panel:     Component Value Date/Time   CHOL 206* 06/14/2015 0215   TRIG 54 06/14/2015 0215   HDL 50 06/14/2015 0215   CHOLHDL 4.1 06/14/2015 0215   VLDL 11 06/14/2015 0215   LDLCALC 145* 06/14/2015 0215   HgbA1c:  Lab Results  Component Value Date   HGBA1C 7.4* 04/17/2015   Urine Drug Screen: No results found for: LABOPIA, COCAINSCRNUR, LABBENZ, AMPHETMU, THCU, LABBARB    IMAGING I have personally reviewed the radiological images below and agree with the radiology interpretations.  Ct Head Wo Contrast 06/13/2015   Left posterior frontal intra cerebral hemorrhage with subarachnoid extension as described above.   Dg Chest Port 1 View 06/14/2015  Stable  support apparatus. Persistent opacity left hilum and left perihilar region. Postradiation fibrosis in left upper lobe again noted. Left basilar atelectasis. No pulmonary edema. No pneumothorax. Status post median sternotomy.  06/13/2015  Endotracheal tube above the carina. Stable appearing postsurgical changes and persistent opacity in the left mid lung field.  06/13/2015   Stable to slightly increased left lung opacity which may represent posttreatment changes. No other significant change.   2D Echocardiogram  - Procedure narrative: Transthoracic echocardiography. Image quality was poor. The study was technically  difficult. - Left ventricle: The cavity size was normal. Systolic function was mildly reduced. The estimated ejection fraction was in the range of 45% to 50%. Wall motion was normal; there were no regional wall motion abnormalities. - Aortic valve: Valve area (Vmax): 2.82 cm^2. - Left atrium: The atrium was mildly dilated. - Right ventricle: The cavity size was mildly dilated. Wall thickness was normal. - Right atrium: The atrium was mildly dilated. - Pericardium, extracardiac: Features were not consistent with tamponade physiology. Impressions:  There was no evidence of a vegetation.  MRI brain w and w/o contrast pending  MRA head - pending  CUS pending  LE venous doppler - negative for DVT  EEG - EEG Abnormalities: 1) mild generalized irregular slow activity Clinical Interpretation: This EEG is consistent with a mild generalized non-specific cerebral dysfunction(encephalopathy). This can be seen with sedative medications among other causes. There was no seizure or seizure predisposition recorded on this study. Please note that a normal EEG does not preclude the possibility of epilepsy.    PHYSICAL EXAM  Temp:  [98 F (36.7 C)-98.5 F (36.9 C)] 98.3 F (36.8 C) (06/05 1600) Pulse Rate:  [38-88] 88 (06/05 1730) Resp:  [15-27] 20 (06/05 1730) BP: (72-147)/(45-108) 127/86 mmHg (06/05 1830) SpO2:  [98 %-100 %] 100 % (06/05 1730) FiO2 (%):  [40 %-100 %] 40 % (06/05 1600)  General - Well nourished, well developed, intubated, very irritable with ET tube.  Ophthalmologic - Fundi not visualized due to continued coughing.  Cardiovascular - Regular rate and rhythm.  Neuro - intubated on sedation, but continued to be highly irritable with ET tube, keep coughing and limits the neuro exam. Not following commands, eyes half way open, PERRL, doll's eye exam limited due to irritability with ET tube. postive corneal and gag. Left UE spontaneous movement. LLE 2/5 to pain. RUE 0/5 and RLE 1/5 to  pain. DTR diminished, and no babinski. Sensation, coordination and gait not tested.   ASSESSMENT/PLAN [name] is a 75 y.o. male with history of stage III lung cancer after chemo and radiation therapy, recent PNA and atrial fibrillation on eliquis presenting with R sided hemiplegia. CT showed a L frontal ICH with adjacent SAH.   ICH:  left frontal ICH with adjacent SAH on Eliquis, hemorrhage secondary to Eliquis associated coagulapathy. However, bleeding from lung cancer metastatics to brain can not be ruled out at this time. Pending MRI with and without contrast.  Reversed with Kcentra  Resultant  Intubated and right hemiplegia  MRI pending  MRA  pending  Carotid Doppler  pending  2D Echo from April  EF 45-50%. No source of embolus, no vegetation  LE venous doppler negative for DVT  LDL 145  HgbA1c 7.4 in April  SCDs for VTE prophylaxis  Diet NPO time specified  Eliquis (apixaban) daily prior to admission, no antithrombotics after admission  No need of nimodipine as this is not aneurysmal SAH  Ongoing aggressive stroke risk factor  management  Therapy recommendations:  pending   Disposition:  pending   Transient vs. Paroxsymal Atrial Fibrillation  Home anticoagulation:  Eliquis (apixaban) daily   Stopped given hemorrhage  Found to have afib in the setting of respiratory failure  Following with Dr. Clayborn Bigness cardiology  Will defer to Dr. Clayborn Bigness if pt needs to be on eliquis after discharge.  Acute Respiratory Failure  Intubated for airway protection  On propofol  Lung cancer  Diagnosed in 04/2014  squamous cell lung cancer LUL s/p chemo & RXT, stage IIIa  No metastasis except adjacent lymph nodes involvement  Possible multiklobar PNA vs pneumonitis secondary to [facility], per oncology note 5/21  MRI brain pending to rule out brain metastasis   Possible seizure with asystole  Treated with ativan and forphenytoin  on Keppra  EEG no  seizure  Hyperlipidemia  Home meds:  No statin   LDL 145, goal < 70  Hold on statin at present given ICH  Diabetes  HgbA1c 7.4 in April, goal < 7.0  Uncontrolled  On SSI  Other Stroke Risk Factors  Advanced age  Former Cigarette smoker, quit 2000  Overweight, Body mass index is 28.25 kg/(m^2).   Coronary artery disease, s/p CABG 2000, MI  Other Active Problems  hypophosphatemia  Hypomagnesia  Hospital day # 1  This patient is critically ill due to [place] on eliquis, seizure, hypotension, afib and at significant risk of neurological worsening, death form hematoma expansion, shock, heart failure, status epilepticus. This patient's care requires constant monitoring of vital signs, hemodynamics, respiratory and cardiac monitoring, review of multiple databases, neurological assessment, discussion with family, other specialists and medical decision making of high complexity. I spent 40 minutes of neurocritical care time in the care of this patient.  Rosalin Hawking, MD PhD Stroke Neurology 06/14/2015 7:43 PM  To contact Stroke Continuity provider, please refer to [email or web address] After hours, contact General Neurology

## 2015-06-14 NOTE — Progress Notes (Signed)
Visited patient room.  Prayed for patient.

## 2015-06-14 NOTE — Progress Notes (Signed)
SLP Cancellation Note  Patient Details Name: Ivan Beasley MRN: [id number] DOB: [birth year]   Cancelled treatment:       Reason Eval/Treat Not Completed: Patient not medically ready   Kaleah Hagemeister, Katherene Ponto 06/14/2015, 7:56 AM

## 2015-06-14 NOTE — Progress Notes (Signed)
[facility] Progress Note Patient Name: Ivan Beasley DOB: [birth year] MRN: [id number]   Date of Service  06/14/2015  HPI/Events of Note  RN calls as pt's HR dropped in the 30s and BP dropped in 37D systolic.  Chart reviewed. Pt intubated for airway protection as he was getting sleepy with AED. Post intubation, BP low so neo started. HR in the 60-70s. Nimodipine was given and just after receiving it, HR and BP dropped as mentioned.  Pt with ICH.   Propofol was dcd. Pt seen, sedated. BP 116/60, 55-60, 20, 100%.  EKG reviewed 1st degreee AV block, sinus bradycardia.  Pt known to have fib/flutter.   eICU Interventions  Plan : 1. Currently, BP is acceptable and HR in 50-60s. Will observe for now.  Cont neo to keep MAP > 65 2. Resume with propofol for sedation.  If BP and HR drop again, will d/c propofol and start versed pushes or drip.  3. Hold off on nimodipine.  RN will let neuro know about the low HR and BP.  4. Check lytes (pt with prolonged qtc).      Intervention Category Intermediate Interventions: Arrhythmia - evaluation and management  [place] 06/14/2015, 1:52 AM

## 2015-06-14 NOTE — Progress Notes (Signed)
Gave patient '30mg'$  Nimotop at 430. Patient once again became bradycardic and hypotensive about 30 minutes after administration of Nimotop. Will continue to monitor. Thayer Ohm D

## 2015-06-14 NOTE — Progress Notes (Signed)
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 He was on Eliquis for PAF and develop ight hemiplegia 6/4 and was found to have left frontal ICH and was transferred to [facility]. Became more somnolent > intubated 6/4 pm   SUBJECTIVE:   VITAL SIGNS: BP 147/63 mmHg  Pulse 88  Temp(Src) 98.5 F (36.9 C) (Axillary)  Resp 22  Ht 6' (1.829 m)  Wt 94.5 kg (208 lb 5.4 oz)  BMI 28.25 kg/m2  SpO2 100%  HEMODYNAMICS:    VENTILATOR SETTINGS: Vent Mode:  [-] PSV;CPAP FiO2 (%):  [40 %-100 %] 40 % Set Rate:  [16 bmp-18 bmp] 16 bmp Vt Set:  [620 mL] 620 mL PEEP:  [5 cmH20] 5 cmH20 Pressure Support:  [5 cmH20] 5 cmH20 Plateau Pressure:  [15 cmH20-17 cmH20] 15 cmH20  INTAKE / OUTPUT: I/O last 3 completed shifts: In: 2218.7 [P.O.:240; I.V.:1528.7; IV Piggyback:450] Out: 2200 [Urine:2200]  PHYSICAL EXAMINATION: General:  Intubated and sedated Neuro:  Right hemiplegia, sedated but follows commands when sedation lightened  HEENT: Left facial droop Cardiovascular: RRR Lungs: Coarse B  Abdomen:  Tender +bs Musculoskeletal:  intact Skin: warm  LABS:  BMET  Recent Labs Lab 06/13/15 0934 06/14/15 0215  NA 137 132*  K 4.6 4.7  CL 103 102  CO2 23 19*  BUN 20 14  CREATININE 1.34* 1.64*  GLUCOSE 139* 291*    Electrolytes  Recent Labs Lab 06/13/15 0934 06/14/15 0215  CALCIUM 9.3 8.8*  MG  --  1.7  PHOS  --  2.3*    CBC  Recent Labs Lab 06/13/15 0934 06/14/15 0215  WBC 5.9 12.1*  HGB 8.9* 8.1*  HCT 26.8* 26.7*  PLT 234 262    Coag's  Recent Labs Lab  06/13/15 0934  APTT 37*  INR 1.84    Sepsis Markers No results for input(s): LATICACIDVEN, PROCALCITON, O2SATVEN in the last 168 hours.  ABG  Recent Labs Lab 06/13/15 1854 06/14/15 0620  PHART 7.376 7.369  PCO2ART 34.2* 31.6*  PO2ART 337* 112*    Liver Enzymes  Recent Labs Lab 06/13/15 0934  AST 18  ALT 15*  ALKPHOS 74  BILITOT 0.4  ALBUMIN 3.7    Cardiac Enzymes  Recent Labs Lab 06/13/15 0934  TROPONINI <0.03    Glucose  Recent Labs Lab 06/13/15 1638 06/13/15 2138 06/13/15 2314 06/14/15 0312 06/14/15 0743 06/14/15 1127  GLUCAP 134* 331* 321* 305* 269* 282*    Imaging Dg Chest Port 1 View  06/14/2015  CLINICAL DATA:  Respiratory failure, history of coronary artery disease, GERD, diabetes, lung cancer EXAM: PORTABLE CHEST 1 VIEW COMPARISON:  06/13/2015 FINDINGS: Cardiomediastinal silhouette is stable. Status post median sternotomy. Persistent opacity left hilum and left perihilar region. Post radiation fibrosis left upper lobe again noted. Left basilar atelectasis. Stable endotracheal and NG tube position. Right IJ Port-A-Cath is unchanged in position. Stable right IJ central line. No pneumothorax. No pulmonary edema. IMPRESSION: Stable support apparatus. Persistent opacity left hilum and left perihilar region. Postradiation fibrosis in left upper lobe again noted. Left basilar atelectasis. No pulmonary edema. No pneumothorax. Status post  median sternotomy. Electronically Signed   By: Lahoma Crocker M.D.   On: 06/14/2015 08:15   Dg Chest Port 1 View  06/13/2015  CLINICAL DATA:  75 year old male with endotracheal tube placement. EXAM: PORTABLE CHEST 1 VIEW COMPARISON:  Radiograph dated 06/13/2015 FINDINGS: Endotracheal tube with tip approximately 4 cm above the carina. An enteric tube is partially visualized coursing the left hemi abdomen with tip beyond the inferior margin of the image. There is a right IJ central line with tip over the mediastinum. Right pectoral  infusion catheter is also noted. There is persistent opacity in the left mid lung field. Multiple surgical clips in the left lower lung field similar to prior study. There is no pneumothorax. The right costophrenic angle has been excluded from the image. The cardiac border is silhouetted. No acute osseous pathology identified. IMPRESSION: Endotracheal tube above the carina. Stable appearing postsurgical changes and persistent opacity in the left mid lung field. Electronically Signed   By: Anner Crete M.D.   On: 06/13/2015 21:05   Dg Chest Port 1 View  06/13/2015  CLINICAL DATA:  Respiratory failure and cough. History of left lung squamous carcinoma. EXAM: PORTABLE CHEST 1 VIEW COMPARISON:  05/27/2015 and prior radiographs.  05/03/2015 PET CT FINDINGS: Cardiomegaly, CABG changes and right Port-A-Cath with tip overlying the lower SVC again noted. Left mid lung opacity is stable to slightly increased in size There is no evidence of pneumothorax or pleural effusion. No other changes identified. IMPRESSION: Stable to slightly increased left lung opacity which may represent posttreatment changes. No other significant change. Electronically Signed   By: Margarette Canada M.D.   On: 06/13/2015 18:10   Dg Abd Portable 1v  06/13/2015  CLINICAL DATA:  Orogastric tube placement EXAM: PORTABLE ABDOMEN - 1 VIEW COMPARISON:  PET-CT May 03, 2015 FINDINGS: Orogastric tube tip and side port are in the stomach. Bowel gas pattern is unremarkable. No bowel dilatation or air-fluid level suggesting obstruction. No free air. Moderate stool is noted in the colon. IMPRESSION: Orogastric tube tip and side port in stomach. Bowel gas pattern unremarkable. No free air evident. Electronically Signed   By: Lowella Grip III M.D.   On: 06/13/2015 20:05     STUDIES:  6/4 ct head with left frontal ICH  CULTURES: 6/5 bxc x 2>> 6/5 uc >> 6/5 Sputum>>  ANTIBIOTICS:   SIGNIFICANT EVENTS: 6/4 left  hemiplegia  LINES/TUBES:   DISCUSSION: Lung cancer pt on eliquis with new ICH  ASSESSMENT / PLAN:  NEUROLOGIC A:   Right hemiplegia with new(6-4) posterior left frontal lobe cerebral hemorrhage  2.4 x 2.2 in setting of anticoagulation for PAF and dx of Squamous cell left lung cancer, post chemo/rtx. 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  BD's  CARDIOVASCULAR A:  Hx of CAD Post OHS PAF Shock > suspect sedation related P:  Stop eliquis  On amiodarone > consider d/c if continued episode bradycardia Attempt to wean phenylephrine  RENAL Lab Results  Component Value Date   CREATININE 1.64* 06/14/2015   CREATININE 1.34* 06/13/2015   CREATININE 1.66* 05/27/2015   CREATININE 1.20 04/30/2014   CREATININE 1.20 05/23/2013   CREATININE 1.28 05/22/2013   A:   Acute on chronic renal failure P:   Follow  BMP w IVF resuscitation   GASTROINTESTINAL A:   GI protection P:   PPI per tube  HEMATOLOGIC A:   Lung cancer post chemo/rtx abd lymph nodes treated with immunosuppresants P:  Will consult H/O this week if needed.  MRI brain to r/o contribution of malignancy to his acute bleed  INFECTIOUS A:   Recent Pna > rx w augmentin P:   Cultures and Pct were not done 6/4 > will order now Defer abx at this time.   ENDOCRINE A:   DM P:   SSI  FAMILY  - Updates:  - Inter-disciplinary family meet or Palliative Care meeting due by:  June 14th   Independent CC time 35 minutes.    Baltazar Apo, MD, PhD 06/14/2015, 12:53 PM [facility] [phone] or if no answer [phone]

## 2015-06-14 NOTE — Progress Notes (Signed)
OT Cancellation Note  Patient Details Name: RICO MASSAR MRN: [id number] DOB: 06/01/1940   Cancelled Treatment:    Reason Eval/Treat Not Completed: Patient not medically ready  [place], OTR/L  [phone] 06/14/2015 06/14/2015, 8:14 AM

## 2015-06-14 NOTE — Progress Notes (Signed)
EEG completed, results pending. 

## 2015-06-14 NOTE — Procedures (Signed)
History: 75 year old male  Sedation: Propofol  Technique: This is a 21 channel routine scalp EEG performed at the bedside with bipolar and monopolar montages arranged in accordance to the international 10/20 system of electrode placement. One channel was dedicated to EKG recording.    Background: There is a posterior dominant rhythm of 9 Hz which is seen predominantly after stimulation. At other times, there is irregular delta and theta activity, and this is present even during maximal wakefulness.  Photic stimulation: Physiologic driving is now performed  EEG Abnormalities: 1) mild generalized irregular slow activity  Clinical Interpretation: This EEG is consistent with a mild generalized non-specific cerebral dysfunction(encephalopathy). This can be seen with sedative medications among other causes. There was no seizure or seizure predisposition recorded on this study. Please note that a normal EEG does not preclude the possibility of epilepsy.   Roland Rack, MD [facility] [phone]  If 7pm- 7am, please page neurology on call as listed in [place].

## 2015-06-14 NOTE — Progress Notes (Signed)
RT attempted to obtain a sputum sample, but there were no secretions currently.

## 2015-06-15 ENCOUNTER — Telehealth: Payer: Self-pay | Admitting: *Deleted

## 2015-06-15 ENCOUNTER — Encounter (HOSPITAL_COMMUNITY): Payer: Self-pay

## 2015-06-15 ENCOUNTER — Inpatient Hospital Stay (HOSPITAL_COMMUNITY): Payer: Medicare HMO

## 2015-06-15 DIAGNOSIS — C3492 Malignant neoplasm of unspecified part of left bronchus or lung: Secondary | ICD-10-CM

## 2015-06-15 LAB — CBC
HEMATOCRIT: 25.4 % — AB (ref 39.0–52.0)
HEMOGLOBIN: 7.8 g/dL — AB (ref 13.0–17.0)
MCH: 26.8 pg (ref 26.0–34.0)
MCHC: 30.7 g/dL (ref 30.0–36.0)
MCV: 87.3 fL (ref 78.0–100.0)
PLATELETS: 219 10*3/uL (ref 150–400)
RBC: 2.91 MIL/uL — AB (ref 4.22–5.81)
RDW: 19.5 % — ABNORMAL HIGH (ref 11.5–15.5)
WBC: 16.6 10*3/uL — AB (ref 4.0–10.5)

## 2015-06-15 LAB — URINE CULTURE: Culture: NO GROWTH

## 2015-06-15 LAB — HEMOGLOBIN A1C
Hgb A1c MFr Bld: 6.8 % — ABNORMAL HIGH (ref 4.8–5.6)
Mean Plasma Glucose: 148 mg/dL

## 2015-06-15 LAB — BASIC METABOLIC PANEL
ANION GAP: 12 (ref 5–15)
BUN: 14 mg/dL (ref 6–20)
CHLORIDE: 105 mmol/L (ref 101–111)
CO2: 19 mmol/L — AB (ref 22–32)
CREATININE: 1.33 mg/dL — AB (ref 0.61–1.24)
Calcium: 9 mg/dL (ref 8.9–10.3)
GFR calc non Af Amer: 51 mL/min — ABNORMAL LOW (ref >60)
GFR, EST AFRICAN AMERICAN: 59 mL/min — AB (ref >60)
Glucose, Bld: 182 mg/dL — ABNORMAL HIGH (ref 65–99)
POTASSIUM: 4.6 mmol/L (ref 3.5–5.1)
SODIUM: 136 mmol/L (ref 135–145)

## 2015-06-15 LAB — GLUCOSE, CAPILLARY
GLUCOSE-CAPILLARY: 164 mg/dL — AB (ref 65–99)
GLUCOSE-CAPILLARY: 178 mg/dL — AB (ref 65–99)
GLUCOSE-CAPILLARY: 260 mg/dL — AB (ref 65–99)
GLUCOSE-CAPILLARY: 260 mg/dL — AB (ref 65–99)
GLUCOSE-CAPILLARY: 207 mg/dL — AB (ref 65–99)
Glucose-Capillary: 204 mg/dL — ABNORMAL HIGH (ref 65–99)
Glucose-Capillary: 193 mg/dL — ABNORMAL HIGH (ref 65–99)

## 2015-06-15 LAB — PROCALCITONIN: Procalcitonin: <0.1 ng/mL

## 2015-06-15 LAB — TRIGLYCERIDES: Triglycerides: 64 mg/dL (ref <150)

## 2015-06-15 MED ORDER — FAMOTIDINE IN NACL 20-0.9 MG/50ML-% IV SOLN
20.0000 mg | Freq: Two times a day (BID) | INTRAVENOUS | Status: DC
  Administered 2015-06-15 (×2): 20 mg via INTRAVENOUS
  Filled 2015-06-15 (×2): qty 50

## 2015-06-15 MED ORDER — IPRATROPIUM-ALBUTEROL 0.5-2.5 (3) MG/3ML IN SOLN
3.0000 mL | Freq: Four times a day (QID) | RESPIRATORY_TRACT | Status: DC
  Administered 2015-06-15 – 2015-06-17 (×7): 3 mL via RESPIRATORY_TRACT
  Filled 2015-06-15 (×8): qty 3

## 2015-06-15 MED ORDER — CETYLPYRIDINIUM CHLORIDE 0.05 % MT LIQD
7.0000 mL | Freq: Two times a day (BID) | OROMUCOSAL | Status: DC
  Administered 2015-06-15 – 2015-06-20 (×9): 7 mL via OROMUCOSAL

## 2015-06-15 MED ORDER — BOOST PLUS PO LIQD
237.0000 mL | Freq: Three times a day (TID) | ORAL | Status: DC
  Administered 2015-06-15 – 2015-06-21 (×14): 237 mL via ORAL
  Filled 2015-06-15 (×23): qty 237

## 2015-06-15 MED ORDER — ATORVASTATIN CALCIUM 40 MG PO TABS
40.0000 mg | ORAL_TABLET | Freq: Every day | ORAL | Status: DC
  Administered 2015-06-15 – 2015-06-20 (×6): 40 mg via ORAL
  Filled 2015-06-15 (×6): qty 1

## 2015-06-15 NOTE — Progress Notes (Signed)
Initial Nutrition Assessment  DOCUMENTATION CODES:   Non-severe (moderate) malnutrition in context of acute illness/injury  INTERVENTION:   Boost Plus TID  NUTRITION DIAGNOSIS:   Malnutrition (Moderate) related to acute illness as evidenced by mild depletion of body fat, moderate depletions of muscle mass.  GOAL:   Patient will meet greater than or equal to 90% of their needs  MONITOR:   PO intake, Supplement acceptance, I & O's, Weight trends  REASON FOR ASSESSMENT:   Malnutrition Screening Tool    ASSESSMENT:   75 yo male dx with left lung squamous cell cancer April 2016 with subsequent chemo/rtx but developed abd lymph node enlargement treated with immunosuppressant therapy April 2017 and he reacted violently to therapy. He was on Eliquis for PAF and develop ight hemiplegia 6/4 and was found to have left frontal ICH and was transferred to [facility]. Became more somnolent > intubated 6/4 pm  6/6 self extubated, passed swallow exam  Per pt and wife pt has lost some weight recently. 4% in two months.  Pt was dx with lung ca April 2016 and went through chemo and XRT. He did not lose weight during that time. He then had a respiratory arrest/PNA 4/17 and has lost weight since due to poor appetite.    Nutrition-Focused physical exam completed. Findings are mild/moderate fat depletion, mild/moderate muscle depletion, and no edema.   Medications reviewed and include: decadron, senokot-s CBG's: 164-211  Diet Order:  DIET DYS 3 Room service appropriate?: Yes; Fluid consistency:: Thin  Skin:  Reviewed, no issues  Last BM:  6/2  Height:   Ht Readings from Last 1 Encounters:  06/13/15 6' (1.829 m)    Weight:   Wt Readings from Last 1 Encounters:  06/13/15 208 lb 5.4 oz (94.5 kg)    Ideal Body Weight:  80.9 kg  BMI:  Body mass index is 28.25 kg/(m^2).  Estimated Nutritional Needs:   Kcal:  2100-2300  Protein:  120-140 grams  Fluid:  > 2.1 L/day  EDUCATION  NEEDS:   No education needs identified at this time  [place], [place], [place] Pager [phone] After Hours Pager

## 2015-06-15 NOTE — Evaluation (Addendum)
Speech Language Pathology Evaluation Patient Details Name: Ivan Beasley MRN: [id number] DOB: [birth year] Today's Date: 06/15/2015 Time: 9371-6967    Problem List:  Patient Active Problem List   Diagnosis Date Noted  . ICH (intracerebral hemorrhage) ([place]) 06/13/2015  . Paroxysmal atrial fibrillation ([place]) 05/13/2015  . PNA (pneumonia) 05/13/2015  . HCAP (healthcare-associated pneumonia)   . Pneumonitis   . Acute respiratory failure ([place])   . Acute on chronic respiratory failure with hypoxia ([place]) 04/17/2015  . Respiratory failure ([place]) 04/17/2015  . Cancer of lung ([place]) 05/17/2014  . COPD, mild ([place]) 08/29/2013  . Cough, persistent 08/29/2013  . Mild chronic obstructive pulmonary disease ([place]) 08/29/2013  . H/O cardiac catheterization 07/29/2013  . Breath shortness 06/23/2013  . Chest pain 06/23/2013  . BP (high blood pressure) 06/20/2013  . Diabetes ([place]) 06/20/2013  . HLD (hyperlipidemia) 06/20/2013  . H/O coronary artery bypass surgery 06/20/2013  . Diabetes mellitus ([place]) 06/20/2013  . H/O total knee replacement 06/03/2013   Past Medical History:  Past Medical History  Diagnosis Date  . Coronary artery disease   . Pneumonia 2000  . Arthritis   . 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 Bruises easily   . GERD (gastroesophageal reflux disease)     takes Omeprazole daily as needed for stomach pain  . Blood transfusion 2001  . Diabetes mellitus     takes Metformin daily;  . Impaired hearing     bil hearing aide  . Macular degeneration     being watched for this but hasn't been "completely" diagnosed  . Myocardial infarction ([place]) 2001  . Cancer [facility]    Past Surgical History:  Past Surgical History  Procedure Laterality Date  . 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 Cardiac catheterization  2001  . Tonsillectomy      as a child  . Colonoscopy    . Cataract extraction      bilateral  . Lumbar laminectomy/decompression microdiscectomy   12/19/2010    Procedure: LUMBAR LAMINECTOMY/DECOMPRESSION MICRODISCECTOMY;  Surgeon: Elaina Hoops;  Location: [place] [facility];  Service: Neurosurgery;  Laterality: N/A;  Lumbar three-four, four-five decompressive lumbar laminectomy  . Joint replacement  right tkr  . Eye surgery      cataracts  . Back surgery      l4 5  . Portacath placement N/A 05/20/2014    Procedure: INSERTION PORT-A-CATH;  Surgeon: Nestor Lewandowsky, MD;  Location: [facility] ORS;  Service: General;  Laterality: N/A;   HPI:  75 y.o. male patient with PMH: GERD, pna, CAD, DM, MI, cancer and chronic admitted with right-sided hemi-plegia. CT of the head revealed left frontal intracerebral hemorrhage, with subarachnoid hemorrhage as well adjacent to it. Intubated approximately 24-30 hours (extubated 6/6).   Assessment / Plan / Recommendation Clinical Impression  Pt exhbits mild-mod cognitive impairments with awareness of situation/anticipating, problem solving and attention. Memory will be evaluated further in diagnostic treatment. Initially he required frequent redirection and reassuring he is not "dying". Pt labile. 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 Wife reports he was independent at home (assist with meds one month ago due to frequent med changes). Pt would benefit from inpatient rehab.      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)   Cognition  Overall Cognitive Status: Impaired/Different from baseline Arousal/Alertness: Awake/alert Orientation Level: Oriented X4 Attention:  Sustained Sustained Attention: Impaired Sustained Attention Impairment: Functional basic Memory:  ( recalled 2 items after 60  sec, needs further testing) Awareness: Impaired Awareness Impairment: Intellectual impairment;Emergent impairment;Anticipatory impairment Problem Solving: Impaired Problem Solving Impairment: Verbal basic;Functional basic Safety/Judgment: Impaired    Comprehension  Auditory Comprehension Overall Auditory Comprehension: Appears within functional limits for tasks assessed Interfering Components: Attention Visual Recognition/Discrimination Discrimination: Not tested Reading Comprehension Reading Status:  (TBA)    Expression Expression Primary Mode of Expression: Verbal Verbal Expression Overall Verbal Expression: Appears within functional limits for tasks assessed Initiation: No impairment Level of Generative/Spontaneous Verbalization: Conversation Repetition:  (NT) Naming: No impairment Pragmatics: No impairment (some inappropriate comments, pt is jokester baseline) Interfering Components: Attention Written Expression Dominant Hand: Right Written Expression:  (TBA)   Oral / Motor  Motor Speech Overall Motor Speech: Appears within functional limits for tasks assessed Respiration: Within functional limits Phonation: Normal Resonance: Within functional limits Articulation: Within functional limitis Intelligibility: Intelligible Motor Planning: Witnin functional limits   GO                    Houston Siren 06/15/2015, 11:09 AM  Orbie Pyo Colvin Caroli.Ed [employer] [phone]

## 2015-06-15 NOTE — Progress Notes (Signed)
PT Cancellation Note  Patient Details Name: ASSER LUCENA MRN: [id number] DOB: October 01, 1940   Cancelled Treatment:    Reason Eval/Treat Not Completed: Patient not medically ready   Duncan Dull 06/15/2015, [place], PT DPT  [phone]

## 2015-06-15 NOTE — Telephone Encounter (Signed)
Informed her that appt is actually on 7/13 with MD on 7/14.  And that Dr Oliva Bustard said to keep appts and we will see how he is recovering

## 2015-06-15 NOTE — Telephone Encounter (Addendum)
Called to report that he has an appt on 6/13 for PET, he is currently in [facility] with a left brain bleed. Asking what needs to be done

## 2015-06-15 NOTE — Progress Notes (Signed)
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 He was on Eliquis for PAF and develop ight hemiplegia 6/4 and was found to have left frontal ICH and was transferred to [facility]. Became more somnolent > intubated 6/4 pm   SUBJECTIVE:  Self extubated this am during WUA, tolerating  VITAL SIGNS: BP 121/58 mmHg  Pulse 93  Temp(Src) 98 F (36.7 C) (Axillary)  Resp 17  Ht 6' (1.829 m)  Wt 94.5 kg (208 lb 5.4 oz)  BMI 28.25 kg/m2  SpO2 94%  HEMODYNAMICS:    VENTILATOR SETTINGS: Vent Mode:  [-] PSV;CPAP FiO2 (%):  [40 %] 40 % Set Rate:  [16 bmp] 16 bmp Vt Set:  [157 mL] 620 mL PEEP:  [5 cmH20] 5 cmH20 Pressure Support:  [5 cmH20] 5 cmH20 Plateau Pressure:  [15 cmH20] 15 cmH20  INTAKE / OUTPUT: I/O last 3 completed shifts: In: 4749.3 [I.V.:4139.2; NG/GT:60; IV Piggyback:550] Out: 5200 [Urine:5200]  PHYSICAL EXAMINATION: General: awake and interacting, extubated Neuro:  Right hemiplegia, follows commands HEENT: Left facial droop, strong high pitched voice Cardiovascular: RRR Lungs: Coarse B  Abdomen:  Tender +bs Musculoskeletal:  intact Skin: warm  LABS:  BMET  Recent Labs Lab 06/13/15 0934 06/14/15 0215 06/15/15 0550  NA 137 132* 136  K 4.6 4.7 4.6  CL 103 102 105  CO2 23 19* 19*  BUN '20 14 14  '$ CREATININE 1.34* 1.64* 1.33*  GLUCOSE 139* 291* 182*    Electrolytes  Recent Labs Lab 06/13/15 0934 06/14/15 0215 06/15/15 0550  CALCIUM 9.3 8.8* 9.0  MG  --  1.7  --   PHOS  --  2.3*  --     CBC  Recent Labs Lab 06/13/15 0934 06/14/15 0215  06/15/15 0550  WBC 5.9 12.1* 16.6*  HGB 8.9* 8.1* 7.8*  HCT 26.8* 26.7* 25.4*  PLT 234 262 219    Coag's  Recent Labs Lab 06/13/15 0934  APTT 37*  INR 1.84    Sepsis Markers  Recent Labs Lab 06/15/15 0550  PROCALCITON <0.10    ABG  Recent Labs Lab 06/13/15 1854 06/14/15 0620  PHART 7.376 7.369  PCO2ART 34.2* 31.6*  PO2ART 337* 112*    Liver Enzymes  Recent Labs Lab 06/13/15 0934  AST 18  ALT 15*  ALKPHOS 74  BILITOT 0.4  ALBUMIN 3.7    Cardiac Enzymes  Recent Labs Lab 06/13/15 0934  TROPONINI <0.03    Glucose  Recent Labs Lab 06/14/15 1127 06/14/15 1522 06/14/15 1930 06/14/15 2329 06/15/15 0323 06/15/15 0746  GLUCAP 282* 211* 182* 204* 164* 178*    Imaging Mr Mra Head Wo Contrast  06/15/2015  ADDENDUM REPORT: 06/15/2015 08:11 ADDENDUM: With the rounded contour of the enhancing component of the left frontal lobe hematoma, follow-up until clearance recommended to exclude underlying vascular abnormality. Electronically Signed   By: Genia Del M.D.   On: 06/15/2015 08:11  06/15/2015  CLINICAL DATA:  75 year old diabetic male with history of lung cancer. Right-sided weakness. Subsequent encounter. EXAM: MRI HEAD WITHOUT CONTRAST MRA HEAD WITHOUT CONTRAST TECHNIQUE: Multiplanar, multiecho pulse sequences of the brain and  surrounding structures were obtained without intravenous contrast. Angiographic images of the head were obtained using MRA technique without contrast. COMPARISON:  06/13/2015 head CT.  07/28/2014 brain MR. FINDINGS: MRI HEAD FINDINGS Posterior left frontal lobe complex 2.5 x 2.2 x 2 cm hemorrhagic lesion with marked surrounding vasogenic edema with breakthrough into sulci with subarachnoid hemorrhage noted. Patient's history of lung cancer in addition to 6 mm rounded area of enhanced along the posterior margin of this hemorrhage suggests that this is most likely is related to a hemorrhagic metastatic lesion which has bled. Continued  MR surveillance as hemorrhage clears is recommended. There may be a a second enhancing lesion within the anterior left parietal lobe with surrounding vasogenic edema. Venous infarct can have a similar appearance. The major dural sinuses appear patent. No acute thrombotic infarct separate from the above described findings. Remote small left parietal lobe, left frontal lobe, posterior left opercular and tiny right thalamic infarct. Mild small vessel disease changes. Moderate global atrophy without hydrocephalus. Opacification left sphenoid sinus with air-fluid level suggesting acute sinusitis. Mild mucosal thickening ethmoid sinus air cells and minimal mucosal thickening frontal sinuses. Bilateral mastoid air cell and middle ear opacification greater on the left without obstructing lesion of the eustachian tube noted. Post lens replacement without acute orbital abnormality noted. MRA HEAD FINDINGS MR angiogram does not incorporate the left frontal lobe hemorrhage. Mild narrowing supraclinoid aspect internal carotid artery bilaterally with irregularity more notable on the left. Small infundibulum on the left posterior communicating artery level. Anterior circulation without medium or large size vessel significant stenosis or occlusion. Middle cerebral artery branch vessel irregularity bilaterally. Right vertebral artery ends in a posterior inferior cerebellar artery distribution. No significant narrowing left vertebral artery. Moderate narrowing portions of the left posterior inferior cerebellar artery. Ectatic basilar artery without high-grade stenosis. Nonvisualized anterior inferior cerebellar arteries. Small left superior cerebellar artery. Posterior cerebral artery distal branch vessel narrowing bilaterally. IMPRESSION: MRI HEAD Posterior left frontal lobe complex 2.5 x 2.2 x 2 cm hemorrhagic lesion with marked surrounding vasogenic edema with breakthrough into sulci with subarachnoid hemorrhage noted. Patient's  history of lung cancer in addition to 6 mm rounded area of enhanced along the posterior margin of this hemorrhage suggests that this is most likely is related to a hemorrhagic metastatic lesion which has bled. Continued MR surveillance as hemorrhage clears is recommended. There may be a a second enhancing lesion within the anterior left parietal lobe with surrounding vasogenic edema. Venous infarct can have a similar appearance. The major dural sinuses appear patent. Opacification left sphenoid sinus with air-fluid level suggesting acute sinusitis. Bilateral mastoid air cell and middle ear opacification greater on the left without obstructing lesion of the eustachian tube noted. MRA HEAD MR angiogram does not incorporate the left frontal lobe hemorrhage. Mild narrowing supraclinoid aspect internal carotid artery bilaterally with irregularity more notable on the left. Small infundibulum on the left posterior communicating artery level. Anterior circulation without medium or large size vessel significant stenosis or occlusion. Middle cerebral artery branch vessel irregularity bilaterally. Right vertebral artery ends in a posterior inferior cerebellar artery distribution. No significant narrowing left vertebral artery. Moderate narrowing portions of the left posterior inferior cerebellar artery. Ectatic basilar artery without high-grade stenosis. Nonvisualized anterior inferior cerebellar arteries. Small left superior cerebellar artery. Posterior cerebral artery distal branch vessel narrowing bilaterally. Electronically Signed: By: Genia Del M.D. On: 06/14/2015 19:34   [name] TT Contrast  06/15/2015  ADDENDUM REPORT: 06/15/2015 08:11 ADDENDUM: With the rounded contour  of the enhancing component of the left frontal lobe hematoma, follow-up until clearance recommended to exclude underlying vascular abnormality. Electronically Signed   By: Genia Del M.D.   On: 06/15/2015 08:11  06/15/2015  CLINICAL DATA:   75 year old diabetic male with history of lung cancer. Right-sided weakness. Subsequent encounter. EXAM: MRI HEAD WITHOUT CONTRAST MRA HEAD WITHOUT CONTRAST TECHNIQUE: Multiplanar, multiecho pulse sequences of the brain and surrounding structures were obtained without intravenous contrast. Angiographic images of the head were obtained using MRA technique without contrast. COMPARISON:  06/13/2015 head CT.  07/28/2014 brain MR. FINDINGS: MRI HEAD FINDINGS Posterior left frontal lobe complex 2.5 x 2.2 x 2 cm hemorrhagic lesion with marked surrounding vasogenic edema with breakthrough into sulci with subarachnoid hemorrhage noted. Patient's history of lung cancer in addition to 6 mm rounded area of enhanced along the posterior margin of this hemorrhage suggests that this is most likely is related to a hemorrhagic metastatic lesion which has bled. Continued MR surveillance as hemorrhage clears is recommended. There may be a a second enhancing lesion within the anterior left parietal lobe with surrounding vasogenic edema. Venous infarct can have a similar appearance. The major dural sinuses appear patent. No acute thrombotic infarct separate from the above described findings. Remote small left parietal lobe, left frontal lobe, posterior left opercular and tiny right thalamic infarct. Mild small vessel disease changes. Moderate global atrophy without hydrocephalus. Opacification left sphenoid sinus with air-fluid level suggesting acute sinusitis. Mild mucosal thickening ethmoid sinus air cells and minimal mucosal thickening frontal sinuses. Bilateral mastoid air cell and middle ear opacification greater on the left without obstructing lesion of the eustachian tube noted. Post lens replacement without acute orbital abnormality noted. MRA HEAD FINDINGS MR angiogram does not incorporate the left frontal lobe hemorrhage. Mild narrowing supraclinoid aspect internal carotid artery bilaterally with irregularity more notable on  the left. Small infundibulum on the left posterior communicating artery level. Anterior circulation without medium or large size vessel significant stenosis or occlusion. Middle cerebral artery branch vessel irregularity bilaterally. Right vertebral artery ends in a posterior inferior cerebellar artery distribution. No significant narrowing left vertebral artery. Moderate narrowing portions of the left posterior inferior cerebellar artery. Ectatic basilar artery without high-grade stenosis. Nonvisualized anterior inferior cerebellar arteries. Small left superior cerebellar artery. Posterior cerebral artery distal branch vessel narrowing bilaterally. IMPRESSION: MRI HEAD Posterior left frontal lobe complex 2.5 x 2.2 x 2 cm hemorrhagic lesion with marked surrounding vasogenic edema with breakthrough into sulci with subarachnoid hemorrhage noted. Patient's history of lung cancer in addition to 6 mm rounded area of enhanced along the posterior margin of this hemorrhage suggests that this is most likely is related to a hemorrhagic metastatic lesion which has bled. Continued MR surveillance as hemorrhage clears is recommended. There may be a a second enhancing lesion within the anterior left parietal lobe with surrounding vasogenic edema. Venous infarct can have a similar appearance. The major dural sinuses appear patent. Opacification left sphenoid sinus with air-fluid level suggesting acute sinusitis. Bilateral mastoid air cell and middle ear opacification greater on the left without obstructing lesion of the eustachian tube noted. MRA HEAD MR angiogram does not incorporate the left frontal lobe hemorrhage. Mild narrowing supraclinoid aspect internal carotid artery bilaterally with irregularity more notable on the left. Small infundibulum on the left posterior communicating artery level. Anterior circulation without medium or large size vessel significant stenosis or occlusion. Middle cerebral artery branch vessel  irregularity bilaterally. Right vertebral artery ends in a posterior inferior cerebellar artery  distribution. No significant narrowing left vertebral artery. Moderate narrowing portions of the left posterior inferior cerebellar artery. Ectatic basilar artery without high-grade stenosis. Nonvisualized anterior inferior cerebellar arteries. Small left superior cerebellar artery. Posterior cerebral artery distal branch vessel narrowing bilaterally. Electronically Signed: By: Genia Del M.D. On: 06/14/2015 19:34   Dg Chest Port 1 View  06/15/2015  CLINICAL DATA:  Acute respiratory failure EXAM: PORTABLE CHEST 1 VIEW COMPARISON:  Portable chest x-ray of 06/14/2015 FINDINGS: Aeration of the lungs has improved somewhat. The tip of the endotracheal tube appears to be approximately 3.8 cm above the carina. Opacity in the left perihilar and left mid lung is unchanged. Cardiomegaly is stable. Right sided Port-A-Cath is unchanged in position, and right IJ central venous line is unchanged. IMPRESSION: Slightly better aeration. Endotracheal tube tip 3.8 cm above the carina. Electronically Signed   By: Ivar Drape M.D.   On: 06/15/2015 08:05     STUDIES:  6/4 ct head with left frontal ICH 6/5 MRI Brain >> posterior L frontal 2.5x2.2x2 cm bleed w marked associated edema, rounded area posterior aspect suspicious for malignancy. Possible 2nd enhancing lesion in anterior L parietal lobe w associated edema and no blood  CULTURES: 6/5 bxc x 2>> 6/5 uc >> 6/5 Sputum>>  ANTIBIOTICS:  SIGNIFICANT EVENTS: 6/4 left hemiplegia  LINES/TUBES:  DISCUSSION: Lung cancer pt on eliquis with new ICH  ASSESSMENT / PLAN:  NEUROLOGIC A:   Right hemiplegia with new (6-4) posterior left frontal lobe cerebral hemorrhage  2.4 x 2.2 in setting of anticoagulation for PAF and dx of Squamous cell left lung cancer, post chemo/rtx. P:   Will need repeat imaging to follow blood for clearing, assess for  malignancy PT/OT/SLP   PULMONARY A: Acute respiratory failure Hx of COPD former smoker Hx of Squamous lung cancer Bullous emphysema on CT scan  Cough P:   Extubated Push pulm hygiene BD's  CARDIOVASCULAR A:  Hx of CAD Post OHS PAF Shock > suspect sedation related P:  Stop eliquis  On amiodarone > consider d/c if continued episode bradycardia Weaned phenylephrine to off  RENAL Lab Results  Component Value Date   CREATININE 1.33* 06/15/2015   CREATININE 1.64* 06/14/2015   CREATININE 1.34* 06/13/2015   CREATININE 1.20 04/30/2014   CREATININE 1.20 05/23/2013   CREATININE 1.28 05/22/2013   A:   Acute on chronic renal failure P:   Follow BMP w IVF resuscitation, improved  GASTROINTESTINAL A:   GI protection P:   PPI per tube > change to pepcid IV until able to tolerate diet  HEMATOLOGIC A:   Lung cancer post chemo/rtx abd lymph nodes treated with immunosuppresants P:  Will consult H/O this week if needed.  MRI brain concerning for metastatic disease in brain associated w the bleed  INFECTIOUS A:   Recent Pna > rx w Augmentin; Pct negative P:   Cultures and Pct 6/5 Defer abx at this time.   ENDOCRINE A:   DM P:   SSI  FAMILY  - Updates: spoke with patient and wife on 6/6 - Inter-disciplinary family meet or Palliative Care meeting due by:  June 14th   Independent CC time 32 minutes.    Baltazar Apo, MD, PhD 06/15/2015, 10:09 AM  Pulmonary and Critical Care [phone] or if no answer [phone]

## 2015-06-15 NOTE — Procedures (Signed)
Extubation Procedure Note  Patient Details:   Name: Ivan Beasley DOB: November 04, 1940 MRN: [id number]   Airway Documentation:     Evaluation  Pt self extubated  Pt in no distress at this time, on RA spo2 99%.  RT will continue to monitor.   Ciro Backer 06/15/2015, 8:10 AM

## 2015-06-15 NOTE — Progress Notes (Signed)
OT Cancellation Note  Patient Details Name: LAURENS MATHENY MRN: [id number] DOB: [birth year]   Cancelled Treatment:    Reason Eval/Treat Not Completed: Patient not medically ready (intubated / sedated)  Vonita Moss   OTR/L Pager: [phone] Office: [phone] .  06/15/2015, 7:08 AM

## 2015-06-15 NOTE — Care Management Important Message (Signed)
Important Message  Patient Details  Name: Ivan Beasley MRN: [id number] Date of Birth: Dec 12, 1940   Medicare Important Message Given:  Yes    Nathen May 06/15/2015, 1:44 PM

## 2015-06-15 NOTE — Progress Notes (Signed)
   06/15/15 1125  Clinical Encounter Type  Visited With Patient and family together;Health care provider  Visit Type Follow-up;Spiritual support;Critical Care  Referral From Family  Spiritual Encounters  Spiritual Needs Prayer;Emotional  Stress Factors  Family Stress Factors Health changes   Chaplain provided follow-up care. Patient is extubated and seems to be doing well. Patient has moments of clarity and moments of confusion. Chaplain offered prayer and support. Chaplain will continue to follow up to support patient and family and see if any needs arise with wanting a [employer] priest.   Jeri Lager, Bonney Roussel 06/15/2015 11:27 AM

## 2015-06-15 NOTE — Progress Notes (Signed)
STROKE TEAM PROGRESS NOTE   SUBJECTIVE (INTERVAL HISTORY) Wife is at bedside. Pt self extubated this am. Tolerating well. Passed swallow. MRI done showed concerning for ICH due to metastatic brain lesion. Wife requested not to tell pt at this time. Will need repeat MRI once blood absorbed.    OBJECTIVE Temp:  [97.7 F (36.5 C)-98.5 F (36.9 C)] 97.7 F (36.5 C) (06/06 0400) Pulse Rate:  [66-96] 96 (06/06 0809) Cardiac Rhythm:  [-] Normal sinus rhythm (06/06 0740) Resp:  [15-22] 16 (06/06 0809) BP: (91-147)/(51-86) 125/62 mmHg (06/06 0809) SpO2:  [99 %-100 %] 99 % (06/06 0809) FiO2 (%):  [40 %] 40 % (06/06 0802)  CBC:   Recent Labs Lab 06/13/15 0934 06/14/15 0215 06/15/15 0550  WBC 5.9 12.1* 16.6*  NEUTROABS 4.6  --   --   HGB 8.9* 8.1* 7.8*  HCT 26.8* 26.7* 25.4*  MCV 86.0 87.3 87.3  PLT 234 262 756    Basic Metabolic Panel:   Recent Labs Lab 06/14/15 0215 06/15/15 0550  NA 132* 136  K 4.7 4.6  CL 102 105  CO2 19* 19*  GLUCOSE 291* 182*  BUN 14 14  CREATININE 1.64* 1.33*  CALCIUM 8.8* 9.0  MG 1.7  --   PHOS 2.3*  --     Lipid Panel:     Component Value Date/Time   CHOL 206* 06/14/2015 0215   TRIG 64 06/15/2015 0550   HDL 50 06/14/2015 0215   CHOLHDL 4.1 06/14/2015 0215   VLDL 11 06/14/2015 0215   LDLCALC 145* 06/14/2015 0215   HgbA1c:  Lab Results  Component Value Date   HGBA1C 6.8* 06/14/2015   Urine Drug Screen: No results found for: LABOPIA, COCAINSCRNUR, LABBENZ, AMPHETMU, THCU, LABBARB    IMAGING I have personally reviewed the radiological images below and agree with the radiology interpretations.  Ct Head Wo Contrast 06/13/2015   Left posterior frontal intra cerebral hemorrhage with subarachnoid extension as described above.   Dg Chest Port 1 View 06/15/2015   Slightly better aeration. Endotracheal tube tip 3.8 cm above the carina.  06/14/2015  Stable support apparatus. Persistent opacity left hilum and left perihilar region. Postradiation  fibrosis in left upper lobe again noted. Left basilar atelectasis. No pulmonary edema. No pneumothorax. Status post median sternotomy.  06/13/2015  Endotracheal tube above the carina. Stable appearing postsurgical changes and persistent opacity in the left mid lung field.  06/13/2015   Stable to slightly increased left lung opacity which may represent posttreatment changes. No other significant change.   2D Echocardiogram  - Procedure narrative: Transthoracic echocardiography. Image quality was poor. The study was technically difficult. - Left ventricle: The cavity size was normal. Systolic function was mildly reduced. The estimated ejection fraction was in the range of 45% to 50%. Wall motion was normal; there were no regional wall motion abnormalities. - Aortic valve: Valve area (Vmax): 2.82 cm^2. - Left atrium: The atrium was mildly dilated. - Right ventricle: The cavity size was mildly dilated. Wall thickness was normal. - Right atrium: The atrium was mildly dilated. - Pericardium, extracardiac: Features were not consistent with tamponade physiology. Impressions:  There was no evidence of a vegetation.  MRI brain w and w/o contrast  06/15/2015   With the rounded contour of the enhancing component of the left frontal lobe hematoma, follow-up until clearance recommended to exclude underlying vascular abnormality. Posterior left frontal lobe complex 2.5 x 2.2 x 2 cm hemorrhagic lesion with marked surrounding vasogenic edema with breakthrough into sulci  with subarachnoid hemorrhage noted. Patient's history of lung cancer in addition to 6 mm rounded area of enhanced along the posterior margin of this hemorrhage suggests that this is most likely is related to a hemorrhagic metastatic lesion which has bled. Continued MR surveillance as hemorrhage clears is recommended. There may be a a second enhancing lesion within the anterior left parietal lobe with surrounding vasogenic edema. Venous infarct can have a  similar appearance. The major dural sinuses appear patent. Opacification left sphenoid sinus with air-fluid level suggesting acute sinusitis. Bilateral mastoid air cell and middle ear opacification greater on the left without obstructing lesion of the eustachian tube noted.   MRA head 06/15/2015  MR angiogram does not incorporate the left frontal lobe hemorrhage. Mild narrowing supraclinoid aspect internal carotid artery bilaterally with irregularity more notable on the left. Small infundibulum on the left posterior communicating artery level. Anterior circulation without medium or large size vessel significant stenosis or occlusion. Middle cerebral artery branch vessel irregularity bilaterally. Right vertebral artery ends in a posterior inferior cerebellar artery distribution. No significant narrowing left vertebral artery. Moderate narrowing portions of the left posterior inferior cerebellar artery. Ectatic basilar artery without high-grade stenosis. Nonvisualized anterior inferior cerebellar arteries. Small left superior cerebellar artery. Posterior cerebral artery distal branch vessel narrowing bilaterally.   CUS pending  LE venous doppler - negative for DVT  EEG - EEG Abnormalities: 1) mild generalized irregular slow activity Clinical Interpretation: This EEG is consistent with a mild generalized non-specific cerebral dysfunction(encephalopathy). This can be seen with sedative medications among other causes. There was no seizure or seizure predisposition recorded on this study. Please note that a normal EEG does not preclude the possibility of epilepsy.    PHYSICAL EXAM General - Well nourished, well developed, not in acute distress  Ophthalmologic - Fundi not visualized due to continued coughing.  Cardiovascular - Regular rate and rhythm.  Neuro - extubated, tolerating well, no respiratory distress. Awake alert and communicating well, mild dysarthria. Following commands on the right, PERRL,  EOMI, facial symmetrical, tongue in middle. Left UE 5/5 UE and 4/5 LE. RUE 0/5 and RLE 1/5 to pain. DTR diminished, and no babinski. Sensation symmetrical, coordination intact LUE and gait not tested.   ASSESSMENT/PLAN [name] is a 75 y.o. male with history of stage III lung cancer after chemo and radiation therapy, recent PNA and atrial fibrillation on eliquis presenting with R sided hemiplegia. CT showed a L frontal ICH with adjacent SAH.   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   Reversed with Kcentra  Resultant right hemiplegia  MRI w/ & w/o  Posterior L frontal lobe hemorrhage w/ marked surrounding vasogenic edema with SAH, concern for metastatic lesion posterior margin of hmg and anterior L parietal lobe.   MRA  Atherosclerosis, no high grade stenosis  Carotid Doppler  Pending due to central line  2D Echo from 04/2015 EF 45-50%. No source of embolus, no vegetation  LE venous doppler negative for DVT  LDL 145  HgbA1c 7.4 in April  SCDs for VTE prophylaxis Diet NPO time specified  Eliquis (apixaban) daily prior to admission, no antithrombotics after admission  Ongoing aggressive stroke risk factor management  Therapy recommendations:  pending   Disposition:  pending   Transient vs. Paroxsymal Atrial Fibrillation  Home anticoagulation:  Eliquis (apixaban) daily   Stopped given hemorrhage  Found to have afib  in the setting of respiratory failure  Following with Dr. Clayborn Bigness cardiology  Will defer to Dr. Clayborn Bigness if pt needs to be on eliquis after discharge.  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   Possible seizure with asystole  Treated with ativan and forphenytoin  on Keppra  EEG no seizure  Hyperlipidemia  Home meds:  No statin   LDL 145, goal < 70  Add lipitor '40mg'$   Continue lipitor on discharge  Diabetes  HgbA1c 7.4 in April, goal < 7.0  Uncontrolled  On SSI  Other Stroke Risk Factors  Advanced age  Former Cigarette smoker, quit 2000  Overweight, Body mass index is 28.25 kg/(m^2).   Coronary artery disease, s/p CABG 2000, MI  Other Active Problems  hypophosphatemia  Hypomagnesia  Hospital day # 2  This patient is critically ill due to [place] on eliquis, seizure, hypotension, afib and at significant risk of neurological worsening, death form hematoma expansion, shock, heart failure, status epilepticus. This patient's care requires constant monitoring of vital signs, hemodynamics, respiratory and cardiac monitoring, review of multiple databases, neurological assessment, discussion with family, other specialists and medical decision making of high complexity. I spent 40 minutes of neurocritical care time in the care of this patient.  Rosalin Hawking, MD PhD Stroke Neurology 06/15/2015 7:18 PM    To contact Stroke Continuity provider, please refer to [email or web address] After hours, contact General Neurology

## 2015-06-15 NOTE — Evaluation (Signed)
Clinical/Bedside Swallow Evaluation Patient Details  Name: Ivan Beasley MRN: [id number] Date of Birth: 04-Sep-1940  Today's Date: 06/15/2015 Time: SLP Start Time (ACUTE ONLY): 1018 SLP Stop Time (ACUTE ONLY): 1037 SLP Time Calculation (min) (ACUTE ONLY): 19 min  Past Medical History:  Past Medical History  Diagnosis Date  . Coronary artery disease   . Pneumonia 2000  . Arthritis   . 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 Bruises easily   . GERD (gastroesophageal reflux disease)     takes Omeprazole daily as needed for stomach pain  . Blood transfusion 2001  . Diabetes mellitus     takes Metformin daily;  . Impaired hearing     bil hearing aide  . Macular degeneration     being watched for this but hasn't been "completely" diagnosed  . Myocardial infarction ([place]) 2001  . Cancer [facility])    Past Surgical History:  Past Surgical History  Procedure Laterality Date  . 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 Cardiac catheterization  2001  . Tonsillectomy      as a child  . Colonoscopy    . Cataract extraction      bilateral  . Lumbar laminectomy/decompression microdiscectomy  12/19/2010    Procedure: LUMBAR LAMINECTOMY/DECOMPRESSION MICRODISCECTOMY;  Surgeon: Elaina Hoops;  Location: [place] NEURO ORS;  Service: Neurosurgery;  Laterality: N/A;  Lumbar three-four, four-five decompressive lumbar laminectomy  . Joint replacement  right tkr  . Eye surgery      cataracts  . Back surgery      l4 5  . Portacath placement N/A 05/20/2014    Procedure: INSERTION PORT-A-CATH;  Surgeon: Nestor Lewandowsky, MD;  Location: [facility] ORS;  Service: General;  Laterality: N/A;   HPI:  75 y.o. male patient with PMH: GERD, pna, CAD, DM, MI, cancer and chronic admitted with right-sided hemi-plegia. CT of the head revealed left frontal intracerebral hemorrhage, with subarachnoid hemorrhage as well adjacent to it. Intubated approximately 24-30 hours (extubated 6/6).   Assessment / Plan /  Recommendation Clinical Impression  Pt self extubated this morning. During speech-language-cognitive assesment pt noted to have adequate vocal quality and intensity, intelligible speech, strong cough and maintaining alertness, therefore SLP requested order for swallow assessment. Oral manipulation and transit mildly delayed. No cough, throat clear or wet vocal quality present during extended observation. He required mild vefbal reminders for small sips no verbalization after swallows. Recommend Dys 3 (mech soft) diet, thin liquids, small sips straw, pills whole with thin (observed without difficulty) and full supervision. ST to follow.     Aspiration Risk   (mild-mod)    Diet Recommendation Dysphagia 3 (Mech soft);Thin liquid   Liquid Administration via: Cup;Straw Medication Administration: Whole meds with liquid Supervision: Patient able to self feed;Full supervision/cueing for compensatory strategies Compensations: Minimize environmental distractions;Slow rate;Small sips/bites Postural Changes: Seated upright at 90 degrees    Other  Recommendations Oral Care Recommendations: Oral care BID   Follow up Recommendations  Inpatient Rehab    Frequency and Duration min 2x/week  2 weeks       Prognosis Prognosis for Safe Diet Advancement: Good Barriers to Reach Goals: Cognitive deficits      Swallow Study   General HPI: 75 y.o. male patient with PMH: GERD, pna, CAD, DM, MI, cancer and chronic admitted with right-sided hemi-plegia. CT of the head revealed left frontal intracerebral hemorrhage, with subarachnoid hemorrhage as well adjacent to it. Intubated approximately  24-30 hours (extubated 6/6). Type of Study: Bedside Swallow Evaluation Previous Swallow Assessment: none Diet Prior to this Study: NPO Temperature Spikes Noted: No Respiratory Status: Nasal cannula History of Recent Intubation: Yes Length of Intubations (days):  (less than 30 hours) Date extubated: 06/15/15  (8:10) Behavior/Cognition: Alert;Cooperative;Requires cueing (HOH) Oral Cavity Assessment: Within Functional Limits Oral Care Completed by SLP: Yes Oral Cavity - Dentition: Dentures, top (no lower) Vision: Functional for self-feeding Self-Feeding Abilities: Able to feed self;Needs assist;Needs set up Patient Positioning: Upright in bed Baseline Vocal Quality: Normal Volitional Cough: Strong Volitional Swallow: Able to elicit    Oral/Motor/Sensory Function Overall Oral Motor/Sensory Function: Mild impairment Facial ROM: Reduced right Facial Symmetry: Abnormal symmetry right Facial Strength: Reduced right Lingual ROM: Within Functional Limits Lingual Symmetry: Within Functional Limits Lingual Strength: Within Functional Limits   Ice Chips Ice chips: Not tested   Thin Liquid Thin Liquid: Within functional limits Presentation: Cup;Straw    Nectar Thick Nectar Thick Liquid: Not tested   Honey Thick Honey Thick Liquid: Not tested   Puree Puree: Within functional limits   Solid   GO   Solid: Impaired Oral Phase Functional Implications: Prolonged oral transit        Houston Siren 06/15/2015,11:41 AM  Orbie Pyo Colvin Caroli.Ed [employer] [phone]

## 2015-06-15 NOTE — Care Management Note (Signed)
Case Management Note  Patient Details  Name: Ivan Beasley MRN: [id number] Date of Birth: 25-Dec-1940  Subjective/Objective:   Pt admitted on 06/13/15 s/p ICH.  PTA, pt independent, lives with spouse.                   Action/Plan: Pt self-extubated this am.  PT/OT consults pending.  Will follow for discharge planning as pt progresses.    Expected Discharge Date:                  Expected Discharge Plan:  [place]  In-House Referral:     Discharge planning Services  CM Consult  Post Acute Care Choice:    Choice offered to:     DME Arranged:    DME Agency:     HH Arranged:    [place] Agency:     Status of Service:  In process, will continue to follow  Medicare Important Message Given:  Yes Date Medicare IM Given:    Medicare IM give by:    Date Additional Medicare IM Given:    Additional Medicare Important Message give by:     If discussed at [place] of Stay Meetings, dates discussed:    Additional Comments:  Reinaldo Raddle, RN, BSN  Trauma/Neuro ICU Case Manager [phone]

## 2015-06-16 ENCOUNTER — Inpatient Hospital Stay (HOSPITAL_COMMUNITY): Payer: Medicare HMO

## 2015-06-16 DIAGNOSIS — R208 Other disturbances of skin sensation: Secondary | ICD-10-CM

## 2015-06-16 DIAGNOSIS — I69398 Other sequelae of cerebral infarction: Secondary | ICD-10-CM

## 2015-06-16 DIAGNOSIS — G8191 Hemiplegia, unspecified affecting right dominant side: Secondary | ICD-10-CM

## 2015-06-16 DIAGNOSIS — C3490 Malignant neoplasm of unspecified part of unspecified bronchus or lung: Secondary | ICD-10-CM

## 2015-06-16 LAB — GLUCOSE, CAPILLARY
GLUCOSE-CAPILLARY: 136 mg/dL — AB (ref 65–99)
GLUCOSE-CAPILLARY: 185 mg/dL — AB (ref 65–99)
GLUCOSE-CAPILLARY: 200 mg/dL — AB (ref 65–99)
GLUCOSE-CAPILLARY: 106 mg/dL — AB (ref 65–99)
Glucose-Capillary: 329 mg/dL — ABNORMAL HIGH (ref 65–99)

## 2015-06-16 LAB — BASIC METABOLIC PANEL
Anion gap: 10 (ref 5–15)
BUN: 17 mg/dL (ref 6–20)
CALCIUM: 9.3 mg/dL (ref 8.9–10.3)
CHLORIDE: 104 mmol/L (ref 101–111)
CO2: 23 mmol/L (ref 22–32)
CREATININE: 1.41 mg/dL — AB (ref 0.61–1.24)
GFR, EST AFRICAN AMERICAN: 55 mL/min — AB (ref >60)
GFR, EST NON AFRICAN AMERICAN: 47 mL/min — AB (ref >60)
Glucose, Bld: 176 mg/dL — ABNORMAL HIGH (ref 65–99)
Potassium: 4.8 mmol/L (ref 3.5–5.1)
SODIUM: 137 mmol/L (ref 135–145)

## 2015-06-16 LAB — CBC
HCT: 24.6 % — ABNORMAL LOW (ref 39.0–52.0)
Hemoglobin: 7.5 g/dL — ABNORMAL LOW (ref 13.0–17.0)
MCH: 26.6 pg (ref 26.0–34.0)
MCHC: 30.5 g/dL (ref 30.0–36.0)
MCV: 87.2 fL (ref 78.0–100.0)
PLATELETS: 175 10*3/uL (ref 150–400)
RBC: 2.82 MIL/uL — ABNORMAL LOW (ref 4.22–5.81)
RDW: 19.9 % — AB (ref 11.5–15.5)
WBC: 11.3 10*3/uL — ABNORMAL HIGH (ref 4.0–10.5)

## 2015-06-16 LAB — PROCALCITONIN: PROCALCITONIN: 0.12 ng/mL

## 2015-06-16 MED ORDER — PANTOPRAZOLE SODIUM 40 MG PO TBEC
40.0000 mg | DELAYED_RELEASE_TABLET | Freq: Every day | ORAL | Status: DC
  Administered 2015-06-16 – 2015-06-21 (×6): 40 mg via ORAL
  Filled 2015-06-16 (×6): qty 1

## 2015-06-16 MED ORDER — GADOBENATE DIMEGLUMINE 529 MG/ML IV SOLN
20.0000 mL | Freq: Once | INTRAVENOUS | Status: AC | PRN
  Administered 2015-06-14: 20 mL via INTRAVENOUS

## 2015-06-16 MED ORDER — DILTIAZEM HCL 30 MG PO TABS
60.0000 mg | ORAL_TABLET | Freq: Two times a day (BID) | ORAL | Status: DC
  Administered 2015-06-16 – 2015-06-21 (×10): 60 mg via ORAL
  Filled 2015-06-16 (×10): qty 2

## 2015-06-16 MED ORDER — METFORMIN HCL 500 MG PO TABS
500.0000 mg | ORAL_TABLET | Freq: Every day | ORAL | Status: DC
  Administered 2015-06-17 – 2015-06-20 (×4): 500 mg via ORAL
  Filled 2015-06-16 (×4): qty 1

## 2015-06-16 MED ORDER — GLIMEPIRIDE 1 MG PO TABS
1.0000 mg | ORAL_TABLET | Freq: Every day | ORAL | Status: DC
  Administered 2015-06-17 – 2015-06-21 (×5): 1 mg via ORAL
  Filled 2015-06-16 (×5): qty 1

## 2015-06-16 MED ORDER — METFORMIN HCL 500 MG PO TABS
1000.0000 mg | ORAL_TABLET | Freq: Every day | ORAL | Status: DC
  Administered 2015-06-17 – 2015-06-21 (×5): 1000 mg via ORAL
  Filled 2015-06-16 (×5): qty 2

## 2015-06-16 MED ORDER — METFORMIN HCL 500 MG PO TABS: 500.0000 mg | ORAL_TABLET | ORAL | Status: DC

## 2015-06-16 MED ORDER — ALBUTEROL SULFATE (2.5 MG/3ML) 0.083% IN NEBU: 2.5000 mg | INHALATION_SOLUTION | Freq: Four times a day (QID) | RESPIRATORY_TRACT | Status: DC | PRN

## 2015-06-16 MED ORDER — ALPRAZOLAM 0.25 MG PO TABS
0.2500 mg | ORAL_TABLET | Freq: Three times a day (TID) | ORAL | Status: DC | PRN
  Administered 2015-06-17: 0.25 mg via ORAL
  Filled 2015-06-16: qty 1

## 2015-06-16 MED ORDER — FAMOTIDINE 20 MG PO TABS: 20.0000 mg | ORAL_TABLET | Freq: Two times a day (BID) | ORAL | Status: DC

## 2015-06-16 MED ORDER — LEVETIRACETAM 750 MG PO TABS
1500.0000 mg | ORAL_TABLET | Freq: Two times a day (BID) | ORAL | Status: DC
  Administered 2015-06-16 – 2015-06-20 (×8): 1500 mg via ORAL
  Filled 2015-06-16 (×8): qty 2

## 2015-06-16 NOTE — Progress Notes (Addendum)
STROKE TEAM PROGRESS NOTE   SUBJECTIVE (INTERVAL HISTORY) Wife is at bedside. Pt is eating breakfast. No choking. Central line needs to be out. Transfer to floor.    OBJECTIVE Temp:  [97.5 F (36.4 C)-99.7 F (37.6 C)] 98.4 F (36.9 C) (06/07 1713) Pulse Rate:  [88-103] 91 (06/07 1713) Cardiac Rhythm:  [-] Normal sinus rhythm;Heart block (06/07 1714) Resp:  [15-23] 18 (06/07 1713) BP: (110-154)/(53-85) 154/66 mmHg (06/07 1713) SpO2:  [91 %-99 %] 97 % (06/07 1713)  CBC:   Recent Labs Lab 06/13/15 0934  06/15/15 0550 06/16/15 0644  WBC 5.9  < > 16.6* 11.3*  NEUTROABS 4.6  --   --   --   HGB 8.9*  < > 7.8* 7.5*  HCT 26.8*  < > 25.4* 24.6*  MCV 86.0  < > 87.3 87.2  PLT 234  < > 219 175  < > = values in this interval not displayed.  Basic Metabolic Panel:   Recent Labs Lab 06/14/15 0215 06/15/15 0550 06/16/15 0644  NA 132* 136 137  K 4.7 4.6 4.8  CL 102 105 104  CO2 19* 19* 23  GLUCOSE 291* 182* 176*  BUN '14 14 17  '$ CREATININE 1.64* 1.33* 1.41*  CALCIUM 8.8* 9.0 9.3  MG 1.7  --   --   PHOS 2.3*  --   --     Lipid Panel:     Component Value Date/Time   CHOL 206* 06/14/2015 0215   TRIG 64 06/15/2015 0550   HDL 50 06/14/2015 0215   CHOLHDL 4.1 06/14/2015 0215   VLDL 11 06/14/2015 0215   LDLCALC 145* 06/14/2015 0215   HgbA1c:  Lab Results  Component Value Date   HGBA1C 6.8* 06/14/2015   Urine Drug Screen: No results found for: LABOPIA, COCAINSCRNUR, LABBENZ, AMPHETMU, THCU, LABBARB    IMAGING I have personally reviewed the radiological images below and agree with the radiology interpretations.  Ct Head Wo Contrast 06/13/2015   Left posterior frontal intra cerebral hemorrhage with subarachnoid extension as described above.   Dg Chest Port 1 View 06/15/2015   Slightly better aeration. Endotracheal tube tip 3.8 cm above the carina.  06/14/2015  Stable support apparatus. Persistent opacity left hilum and left perihilar region. Postradiation fibrosis in left  upper lobe again noted. Left basilar atelectasis. No pulmonary edema. No pneumothorax. Status post median sternotomy.  06/13/2015  Endotracheal tube above the carina. Stable appearing postsurgical changes and persistent opacity in the left mid lung field.  06/13/2015   Stable to slightly increased left lung opacity which may represent posttreatment changes. No other significant change.   2D Echocardiogram  - Procedure narrative: Transthoracic echocardiography. Image quality was poor. The study was technically difficult. - Left ventricle: The cavity size was normal. Systolic function was mildly reduced. The estimated ejection fraction was in the range of 45% to 50%. Wall motion was normal; there were no regional wall motion abnormalities. - Aortic valve: Valve area (Vmax): 2.82 cm^2. - Left atrium: The atrium was mildly dilated. - Right ventricle: The cavity size was mildly dilated. Wall thickness was normal. - Right atrium: The atrium was mildly dilated. - Pericardium, extracardiac: Features were not consistent with tamponade physiology. Impressions:  There was no evidence of a vegetation.  MRI brain w and w/o contrast  06/15/2015   With the rounded contour of the enhancing component of the left frontal lobe hematoma, follow-up until clearance recommended to exclude underlying vascular abnormality. Posterior left frontal lobe complex 2.5 x 2.2  x 2 cm hemorrhagic lesion with marked surrounding vasogenic edema with breakthrough into sulci with subarachnoid hemorrhage noted. Patient's history of lung cancer in addition to 6 mm rounded area of enhanced along the posterior margin of this hemorrhage suggests that this is most likely is related to a hemorrhagic metastatic lesion which has bled. Continued MR surveillance as hemorrhage clears is recommended. There may be a a second enhancing lesion within the anterior left parietal lobe with surrounding vasogenic edema. Venous infarct can have a similar appearance.  The major dural sinuses appear patent. Opacification left sphenoid sinus with air-fluid level suggesting acute sinusitis. Bilateral mastoid air cell and middle ear opacification greater on the left without obstructing lesion of the eustachian tube noted.   MRA head 06/15/2015  MR angiogram does not incorporate the left frontal lobe hemorrhage. Mild narrowing supraclinoid aspect internal carotid artery bilaterally with irregularity more notable on the left. Small infundibulum on the left posterior communicating artery level. Anterior circulation without medium or large size vessel significant stenosis or occlusion. Middle cerebral artery branch vessel irregularity bilaterally. Right vertebral artery ends in a posterior inferior cerebellar artery distribution. No significant narrowing left vertebral artery. Moderate narrowing portions of the left posterior inferior cerebellar artery. Ectatic basilar artery without high-grade stenosis. Nonvisualized anterior inferior cerebellar arteries. Small left superior cerebellar artery. Posterior cerebral artery distal branch vessel narrowing bilaterally.   CUS pending  LE venous doppler - negative for DVT  EEG - EEG Abnormalities: 1) mild generalized irregular slow activity Clinical Interpretation: This EEG is consistent with a mild generalized non-specific cerebral dysfunction(encephalopathy). This can be seen with sedative medications among other causes. There was no seizure or seizure predisposition recorded on this study. Please note that a normal EEG does not preclude the possibility of epilepsy.    PHYSICAL EXAM General - Well nourished, well developed, not in acute distress  Ophthalmologic - Fundi not visualized due to continued coughing.  Cardiovascular - Regular rate and rhythm.  Neuro - awake alert, not in acute distress. Communicating well, follows commands, able to repeat and name with mild dysarthria. No visual field deficit, PERRL, EOMI, facial  symmetrical, tongue in middle. Left UE 5/5 UE and 4/5 LE. RUE 0/5 and RLE 1/5 to pain. DTR diminished, and no babinski. Sensation symmetrical, coordination intact LUE and gait not tested.   ASSESSMENT/PLAN [name] is a 75 y.o. male with history of stage III lung cancer after chemo and radiation therapy, recent PNA and atrial fibrillation on eliquis presenting with R sided hemiplegia. CT showed a L frontal ICH with adjacent SAH.   ICH:  left frontal ICH with adjacent SAH while on Eliquis. 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   Reversed with Kcentra  Resultant right hemiplegia  MRI w/ & w/o  Posterior L frontal lobe hemorrhage w/ marked surrounding vasogenic edema with SAH, concern for metastatic lesion posterior margin of hmg and anterior L parietal lobe.   MRA  Atherosclerosis, no high grade stenosis  Carotid Doppler  Pending  2D Echo from 04/2015 EF 45-50%. No source of embolus, no vegetation  LE venous doppler negative for DVT  LDL 145  HgbA1c 7.4 in April  SCDs for VTE prophylaxis Diet heart healthy/carb modified Room service appropriate?: Yes; Fluid consistency:: Thin  Eliquis (apixaban) daily prior to admission, no antithrombotics after admission  Ongoing aggressive stroke risk factor management  Therapy recommendations:  CIR   Disposition:  pending  Transient vs. Paroxsymal Atrial Fibrillation  Home anticoagulation:  Eliquis (apixaban) daily   Stopped given hemorrhage  Found to have afib in the setting of respiratory failure  Following with Dr. Clayborn Bigness cardiology  Will defer to Dr. Clayborn Bigness if pt needs to be on eliquis after discharge.  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  Repeat CXR.    Possible seizure with asystole  Treated with ativan and forphenytoin  on Keppra  EEG no seizure  Hyperlipidemia  Home meds:  No statin   LDL 145, goal < 70  Add lipitor '40mg'$   Continue lipitor on discharge  Diabetes  HgbA1c 7.4 in April, goal < 7.0  Uncontrolled  On SSI  Put back on home meds  Other Stroke Risk Factors  Advanced age  Former Cigarette smoker, quit 2000  Overweight, Body mass index is 28.25 kg/(m^2).   Coronary artery disease, s/p CABG 2000, [place]  Other Active Problems  hypophosphatemia  Hypomagnesia  Hospital day # 3   Rosalin Hawking, MD PhD Stroke Neurology 06/16/2015 6:56 PM    To contact Stroke Continuity provider, please refer to [email or web address] After hours, contact General Neurology

## 2015-06-16 NOTE — Consult Note (Signed)
Physical Medicine and Rehabilitation Consult Reason for Consult: Left frontal ICH with adjacent SAH Referring Physician: Dr.Xu   HPI: Ivan Beasley is a 75 y.o. 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 Per chart review patient is married and independent prior to admission and driving short distances. One level home with 2 steps to entry. Presented with 06/13/2015 to [facility] with altered mental status and right-sided weakness. CT of the head revealed left frontal intracerebral hemorrhage with subarachnoid hemorrhage. Patient's eliquis was discontinued. Patient was transferred to [facility] for further evaluation. MRI of the brain showed posterior left frontal lobe complex 2.5 x 2.2 x 2 cm hemorrhagic lesion with marked surrounding vasogenic edema with breakthrough into sulci with subarachnoid hemorrhage noted. Maintained on Keppra for seizure prophylaxis. EEG mild generalized nonspecific cerebral dysfunction consistent with encephalopathy. Recent echocardiogram with ejection fraction of 50% without embolus. Lower extremity Dopplers negative. Plan to repeat MRI for further evaluation of possible metastases posterior left frontal lobe after ICH absorbed. No current plan to resume eliquis at this time. Currently on a mechanical soft thin liquid diet. Occupational therapy evaluation completed with recommendations of physical medicine rehabilitation consult.  Patient was up with OT today. Patient tends to lean towards the right side but is able to self-correct.  Review of Systems  Constitutional: Negative for fever and chills.  HENT: Positive for  hearing loss.   Eyes: Negative for blurred vision and double vision.  Respiratory: Positive for cough. Negative for shortness of breath.   Cardiovascular: Positive for palpitations and leg swelling. Negative for chest pain.  Gastrointestinal: Positive for constipation. Negative for nausea and vomiting.       GERD  Genitourinary: Positive for frequency. Negative for dysuria and hematuria.  Musculoskeletal: Positive for myalgias and back pain.  Skin: Negative for rash.  Neurological: Positive for dizziness, weakness and headaches. Negative for loss of consciousness.  All other systems reviewed and are negative.  Past Medical History  Diagnosis Date  . Coronary artery disease   . Pneumonia 2000  . Arthritis   . 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 Bruises easily   . GERD (gastroesophageal reflux disease)     takes Omeprazole daily as needed for stomach pain  . Blood transfusion 2001  . Diabetes mellitus     takes Metformin daily;  . Impaired hearing     bil hearing aide  . Macular degeneration     being watched for this but hasn't been "completely" diagnosed  . Myocardial infarction ([place]) 2001  . Cancer [facility])    Past Surgical History  Procedure Laterality Date  . 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 Cardiac catheterization  2001  . Tonsillectomy      as a child  . Colonoscopy    . Cataract extraction      bilateral  . Lumbar laminectomy/decompression microdiscectomy  12/19/2010    Procedure: LUMBAR LAMINECTOMY/DECOMPRESSION MICRODISCECTOMY;  Surgeon: Elaina Hoops;  Location: [facility];  Service: Neurosurgery;  Laterality: N/A;  Lumbar three-four, four-five decompressive lumbar laminectomy  . Joint replacement  right tkr  . Eye surgery  cataracts  . Back surgery      l4 5  . Portacath placement N/A 05/20/2014    Procedure: INSERTION PORT-A-CATH;  Surgeon: Nestor Lewandowsky, MD;  Location: [facility] ORS;  Service: General;  Laterality: N/A;   Family  History  Problem Relation Age of Onset  . Anesthesia problems Neg Hx   . Hypotension Neg Hx   . Malignant hyperthermia Neg Hx   . Pseudochol deficiency Neg Hx   . Diabetes type II Other    Social History:  reports that he quit smoking about 16 years ago. His smoking use included Cigarettes. He quit after 40 years of use. He has never used smokeless tobacco. He reports that he does not drink alcohol or use illicit drugs. Allergies: No Known Allergies Medications Prior to Admission  Medication Sig Dispense Refill  . albuterol (PROVENTIL) (2.5 MG/3ML) 0.083% nebulizer solution Take 3 mLs (2.5 mg total) by nebulization every 6 (six) hours as needed for wheezing or shortness of breath. 75 mL 2  . ALPRAZolam (XANAX) 0.25 MG tablet Take 1 tablet (0.25 mg total) by mouth 3 (three) times daily as needed for anxiety. 20 tablet 0  . amiodarone (PACERONE) 200 MG tablet Take 1 tablet (200 mg total) by mouth daily. 30 tablet 0  . amoxicillin-clavulanate (AUGMENTIN) 875-125 MG tablet Take 1 tablet by mouth 2 (two) times daily. 20 tablet 0  . apixaban (ELIQUIS) 5 MG TABS tablet Take 1 tablet (5 mg total) by mouth 2 (two) times daily. 60 tablet 0  . benzonatate (TESSALON) 100 MG capsule TAKE 1 CAPSULE (100 MG TOTAL) BY MOUTH 3 (THREE) TIMES DAILY AS NEEDED FOR COUGH. 90 capsule 3  . bismuth subsalicylate (KAOPECTATE) 262 MG/15ML suspension Take 30 mLs by mouth 3 (three) times daily as needed. 360 mL 0  . Blood Glucose Calibration (TRUETEST CONTROL LEVEL 1) LIQD Use as directed. Reported on 04/17/2015    . diltiazem (CARDIZEM) 60 MG tablet Take 60 mg by mouth 2 (two) times daily.  6  . diphenoxylate-atropine (LOMOTIL) 2.5-0.025 MG tablet TAKE 1 TABLET EVERY 4 HOURS AS NEEDED FOR DIARRHEA /LOOSE STOOL 45 tablet 1  . glimepiride (AMARYL) 1 MG tablet Take 1 mg by mouth daily.     Marland Kitchen glucose blood test strip Use as directed.    Elmore Guise Devices (TRUEDRAW LANCING DEVICE) MISC Use as directed.    .  lidocaine-prilocaine (EMLA) cream Apply 1 application topically See admin instructions. 1 application to site area topically as directed.    . magnesium oxide (MAG-OX) 400 MG tablet Take 400 mg by mouth daily.      . Melatonin 5 MG CAPS Take 1 capsule (5 mg total) by mouth at bedtime as needed.  0  . metFORMIN (GLUCOPHAGE) 500 MG tablet Take 500-1,000 mg by mouth See admin instructions. Take 1000 mg by mouth in the morning,and take 500 mg by mouth every night at bedtime.    Marland Kitchen omeprazole (PRILOSEC) 40 MG capsule Take 40 mg by mouth daily.     Marland Kitchen oxyCODONE (ROXICODONE) 5 MG immediate release tablet Take 1 tablet (5 mg total) by mouth every 6 (six) hours as needed for severe pain. 30 tablet 0  . predniSONE (DELTASONE) 10 MG tablet Take 6 tablets (60 mg total) by mouth daily with breakfast. Taper by '10mg'$  every day until complete 21 tablet 0  . SYMBICORT 160-4.5 MCG/ACT inhaler Inhale 2 puffs into the lungs 2 (two) times daily.   12  . tiotropium (SPIRIVA) 18 MCG inhalation  capsule Place 1 capsule (18 mcg total) into inhaler and inhale daily. 30 capsule 0  . traMADol (ULTRAM) 50 MG tablet Take 50 mg by mouth every 6 (six) hours as needed for moderate pain or severe pain.       Home: Home Living Family/patient expects to be discharged to:: Private residence Living Arrangements: Spouse/significant other Available Help at Discharge: Family Type of Home: House Home Access: Stairs to enter Technical brewer of Steps: 2 [place]: One level Bathroom Shower/Tub: Chiropodist: [place]: None  Lives With: Spouse  Functional History: Prior Function Level of Independence: Independent Functional Status:  Mobility: Bed Mobility Overal bed mobility: Needs Assistance, +2 for physical assistance Bed Mobility: Rolling, Sidelying to Sit Rolling: Max assist Sidelying to sit: Total assist, +2 for physical assistance General bed mobility comments: Pt able to reach for  bed rail with L hand to assist with rolling. Pt able to advance L leg to EOB but required total assist for R LE, bringing LEs off EOB and at trunk for coming to upright position. Transfers General transfer comment: Not assessed at this time.      ADL: ADL Overall ADL's : Needs assistance/impaired Eating/Feeding: Maximal assistance, Sitting Eating/Feeding Details (indicate cue type and reason): Pt attempted to self feed with L hand; able to take small bites x3 then requesting wife to assist.  Grooming: Maximal assistance, Bed level Upper Body Bathing: Maximal assistance, Sitting Lower Body Bathing: Total assistance, Bed level Upper Body Dressing : Maximal assistance, Sitting Lower Body Dressing: Total assistance, Bed level General ADL Comments: Pt with decreased trunk control and unable to sit EOB without total assist for support and balance. Did not attempt transfers at this time. Pt tearful at times when discussing OT plan of care and d/c recommendations.  Cognition: Cognition Overall Cognitive Status: Impaired/Different from baseline (to be further assessed) Arousal/Alertness: Awake/alert Orientation Level: Oriented X4 Attention: Sustained Sustained Attention: Impaired Sustained Attention Impairment: Functional basic Memory:  ( recalled 2 items after 60  sec, needs further testing) Awareness: Impaired Awareness Impairment: Intellectual impairment, Emergent impairment, Anticipatory impairment Problem Solving: Impaired Problem Solving Impairment: Verbal basic, Functional basic Safety/Judgment: Impaired Cognition Arousal/Alertness: Awake/alert Behavior During Therapy: Flat affect (Tearful at times) Overall Cognitive Status: Impaired/Different from baseline (to be further assessed)  Blood pressure 110/55, pulse 95, temperature 98.1 F (36.7 C), temperature source Oral, resp. rate 20, height 6' (1.829 m), weight 94.5 kg (208 lb 5.4 oz), SpO2 99 %. Physical Exam  Vitals  reviewed. HENT:  Head: Normocephalic.  Eyes: EOM are normal.  Neck: Normal range of motion. Neck supple. No thyromegaly present.  Cardiovascular:  Cardiac rate controlled  Respiratory: Effort normal and breath sounds normal.  GI: Soft. Bowel sounds are normal. He exhibits no distension.  Neurological: He is alert.  Mood is flat but appropriate. He is oriented to person place as well as H date of birth. Follow simple commands  Skin: Skin is warm and dry.  2 minus right finger flexors otherwise 0/5 in the right upper extremity at the deltoid and bicep tricep and finger extensors. 0/5 in the right hip flexors and knee extensors ankle dorsiflexor and plantar flexor. 2 minus in the left hip adductor 5/5 in the left deltoid, biceps, triceps, grip, hip flexor, knee extensor, ankle dorsiflexor Sensation is absent to light touch in the right upper and right lower limb. He does have some grimace to finger pinch on the right side.  Results for orders placed or performed  during the hospital encounter of 06/13/15 (from the past 24 hour(s))  Glucose, capillary     Status: Abnormal   Collection Time: 06/15/15 11:16 AM  Result Value Ref Range   Glucose-Capillary 193 (H) 65 - 99 mg/dL   Comment 1 Notify RN    Comment 2 Document in Chart   Glucose, capillary     Status: Abnormal   Collection Time: 06/15/15  3:37 PM  Result Value Ref Range   Glucose-Capillary 260 (H) 65 - 99 mg/dL   Comment 1 Notify RN    Comment 2 Document in Chart   Glucose, capillary     Status: Abnormal   Collection Time: 06/15/15  7:30 PM  Result Value Ref Range   Glucose-Capillary 260 (H) 65 - 99 mg/dL  Glucose, capillary     Status: Abnormal   Collection Time: 06/15/15 11:07 PM  Result Value Ref Range   Glucose-Capillary 207 (H) 65 - 99 mg/dL  Glucose, capillary     Status: Abnormal   Collection Time: 06/16/15  3:25 AM  Result Value Ref Range   Glucose-Capillary 136 (H) 65 - 99 mg/dL  Procalcitonin     Status: None    Collection Time: 06/16/15  5:11 AM  Result Value Ref Range   Procalcitonin 0.12 ng/mL  CBC     Status: Abnormal   Collection Time: 06/16/15  6:44 AM  Result Value Ref Range   WBC 11.3 (H) 4.0 - 10.5 K/uL   RBC 2.82 (L) 4.22 - 5.81 MIL/uL   Hemoglobin 7.5 (L) 13.0 - 17.0 g/dL   HCT 24.6 (L) 39.0 - 52.0 %   MCV 87.2 78.0 - 100.0 fL   MCH 26.6 26.0 - 34.0 pg   MCHC 30.5 30.0 - 36.0 g/dL   RDW 19.9 (H) 11.5 - 15.5 %   Platelets 175 150 - 400 K/uL  Basic metabolic panel     Status: Abnormal   Collection Time: 06/16/15  6:44 AM  Result Value Ref Range   Sodium 137 135 - 145 mmol/L   Potassium 4.8 3.5 - 5.1 mmol/L   Chloride 104 101 - 111 mmol/L   CO2 23 22 - 32 mmol/L   Glucose, Bld 176 (H) 65 - 99 mg/dL   BUN 17 6 - 20 mg/dL   Creatinine, Ser 1.41 (H) 0.61 - 1.24 mg/dL   Calcium 9.3 8.9 - 10.3 mg/dL   GFR calc non Af Amer 47 (L) >60 mL/min   GFR calc Af Amer 55 (L) >60 mL/min   Anion gap 10 5 - 15  Glucose, capillary     Status: Abnormal   Collection Time: 06/16/15  8:05 AM  Result Value Ref Range   Glucose-Capillary 185 (H) 65 - 99 mg/dL   Comment 1 Document in Chart    Mr Virgel Paling Wo Contrast  06/15/2015  ADDENDUM REPORT: 06/15/2015 08:11 ADDENDUM: With the rounded contour of the enhancing component of the left frontal lobe hematoma, follow-up until clearance recommended to exclude underlying vascular abnormality. Electronically Signed   By: Genia Del M.D.   On: 06/15/2015 08:11  06/15/2015  CLINICAL DATA:  75 year old diabetic male with history of lung cancer. Right-sided weakness. Subsequent encounter. EXAM: MRI HEAD WITHOUT CONTRAST MRA HEAD WITHOUT CONTRAST TECHNIQUE: Multiplanar, multiecho pulse sequences of the brain and surrounding structures were obtained without intravenous contrast. Angiographic images of the head were obtained using MRA technique without contrast. COMPARISON:  06/13/2015 head CT.  07/28/2014 brain MR. FINDINGS: MRI HEAD FINDINGS Posterior left  frontal  lobe complex 2.5 x 2.2 x 2 cm hemorrhagic lesion with marked surrounding vasogenic edema with breakthrough into sulci with subarachnoid hemorrhage noted. Patient's history of lung cancer in addition to 6 mm rounded area of enhanced along the posterior margin of this hemorrhage suggests that this is most likely is related to a hemorrhagic metastatic lesion which has bled. Continued MR surveillance as hemorrhage clears is recommended. There may be a a second enhancing lesion within the anterior left parietal lobe with surrounding vasogenic edema. Venous infarct can have a similar appearance. The major dural sinuses appear patent. No acute thrombotic infarct separate from the above described findings. Remote small left parietal lobe, left frontal lobe, posterior left opercular and tiny right thalamic infarct. Mild small vessel disease changes. Moderate global atrophy without hydrocephalus. Opacification left sphenoid sinus with air-fluid level suggesting acute sinusitis. Mild mucosal thickening ethmoid sinus air cells and minimal mucosal thickening frontal sinuses. Bilateral mastoid air cell and middle ear opacification greater on the left without obstructing lesion of the eustachian tube noted. Post lens replacement without acute orbital abnormality noted. MRA HEAD FINDINGS MR angiogram does not incorporate the left frontal lobe hemorrhage. Mild narrowing supraclinoid aspect internal carotid artery bilaterally with irregularity more notable on the left. Small infundibulum on the left posterior communicating artery level. Anterior circulation without medium or large size vessel significant stenosis or occlusion. Middle cerebral artery branch vessel irregularity bilaterally. Right vertebral artery ends in a posterior inferior cerebellar artery distribution. No significant narrowing left vertebral artery. Moderate narrowing portions of the left posterior inferior cerebellar artery. Ectatic basilar artery without  high-grade stenosis. Nonvisualized anterior inferior cerebellar arteries. Small left superior cerebellar artery. Posterior cerebral artery distal branch vessel narrowing bilaterally. IMPRESSION: MRI HEAD Posterior left frontal lobe complex 2.5 x 2.2 x 2 cm hemorrhagic lesion with marked surrounding vasogenic edema with breakthrough into sulci with subarachnoid hemorrhage noted. Patient's history of lung cancer in addition to 6 mm rounded area of enhanced along the posterior margin of this hemorrhage suggests that this is most likely is related to a hemorrhagic metastatic lesion which has bled. Continued MR surveillance as hemorrhage clears is recommended. There may be a a second enhancing lesion within the anterior left parietal lobe with surrounding vasogenic edema. Venous infarct can have a similar appearance. The major dural sinuses appear patent. Opacification left sphenoid sinus with air-fluid level suggesting acute sinusitis. Bilateral mastoid air cell and middle ear opacification greater on the left without obstructing lesion of the eustachian tube noted. MRA HEAD MR angiogram does not incorporate the left frontal lobe hemorrhage. Mild narrowing supraclinoid aspect internal carotid artery bilaterally with irregularity more notable on the left. Small infundibulum on the left posterior communicating artery level. Anterior circulation without medium or large size vessel significant stenosis or occlusion. Middle cerebral artery branch vessel irregularity bilaterally. Right vertebral artery ends in a posterior inferior cerebellar artery distribution. No significant narrowing left vertebral artery. Moderate narrowing portions of the left posterior inferior cerebellar artery. Ectatic basilar artery without high-grade stenosis. Nonvisualized anterior inferior cerebellar arteries. Small left superior cerebellar artery. Posterior cerebral artery distal branch vessel narrowing bilaterally. Electronically Signed: By:  Genia Del M.D. On: 06/14/2015 19:34   [name] NA Contrast  06/15/2015  ADDENDUM REPORT: 06/15/2015 08:11 ADDENDUM: With the rounded contour of the enhancing component of the left frontal lobe hematoma, follow-up until clearance recommended to exclude underlying vascular abnormality. Electronically Signed   By: Genia Del M.D.   On: 06/15/2015 08:11  06/15/2015  CLINICAL DATA:  75 year old diabetic male with history of lung cancer. Right-sided weakness. Subsequent encounter. EXAM: MRI HEAD WITHOUT CONTRAST MRA HEAD WITHOUT CONTRAST TECHNIQUE: Multiplanar, multiecho pulse sequences of the brain and surrounding structures were obtained without intravenous contrast. Angiographic images of the head were obtained using MRA technique without contrast. COMPARISON:  06/13/2015 head CT.  07/28/2014 brain MR. FINDINGS: MRI HEAD FINDINGS Posterior left frontal lobe complex 2.5 x 2.2 x 2 cm hemorrhagic lesion with marked surrounding vasogenic edema with breakthrough into sulci with subarachnoid hemorrhage noted. Patient's history of lung cancer in addition to 6 mm rounded area of enhanced along the posterior margin of this hemorrhage suggests that this is most likely is related to a hemorrhagic metastatic lesion which has bled. Continued MR surveillance as hemorrhage clears is recommended. There may be a a second enhancing lesion within the anterior left parietal lobe with surrounding vasogenic edema. Venous infarct can have a similar appearance. The major dural sinuses appear patent. No acute thrombotic infarct separate from the above described findings. Remote small left parietal lobe, left frontal lobe, posterior left opercular and tiny right thalamic infarct. Mild small vessel disease changes. Moderate global atrophy without hydrocephalus. Opacification left sphenoid sinus with air-fluid level suggesting acute sinusitis. Mild mucosal thickening ethmoid sinus air cells and minimal mucosal thickening frontal  sinuses. Bilateral mastoid air cell and middle ear opacification greater on the left without obstructing lesion of the eustachian tube noted. Post lens replacement without acute orbital abnormality noted. MRA HEAD FINDINGS MR angiogram does not incorporate the left frontal lobe hemorrhage. Mild narrowing supraclinoid aspect internal carotid artery bilaterally with irregularity more notable on the left. Small infundibulum on the left posterior communicating artery level. Anterior circulation without medium or large size vessel significant stenosis or occlusion. Middle cerebral artery branch vessel irregularity bilaterally. Right vertebral artery ends in a posterior inferior cerebellar artery distribution. No significant narrowing left vertebral artery. Moderate narrowing portions of the left posterior inferior cerebellar artery. Ectatic basilar artery without high-grade stenosis. Nonvisualized anterior inferior cerebellar arteries. Small left superior cerebellar artery. Posterior cerebral artery distal branch vessel narrowing bilaterally. IMPRESSION: MRI HEAD Posterior left frontal lobe complex 2.5 x 2.2 x 2 cm hemorrhagic lesion with marked surrounding vasogenic edema with breakthrough into sulci with subarachnoid hemorrhage noted. Patient's history of lung cancer in addition to 6 mm rounded area of enhanced along the posterior margin of this hemorrhage suggests that this is most likely is related to a hemorrhagic metastatic lesion which has bled. Continued MR surveillance as hemorrhage clears is recommended. There may be a a second enhancing lesion within the anterior left parietal lobe with surrounding vasogenic edema. Venous infarct can have a similar appearance. The major dural sinuses appear patent. Opacification left sphenoid sinus with air-fluid level suggesting acute sinusitis. Bilateral mastoid air cell and middle ear opacification greater on the left without obstructing lesion of the eustachian tube noted.  MRA HEAD MR angiogram does not incorporate the left frontal lobe hemorrhage. Mild narrowing supraclinoid aspect internal carotid artery bilaterally with irregularity more notable on the left. Small infundibulum on the left posterior communicating artery level. Anterior circulation without medium or large size vessel significant stenosis or occlusion. Middle cerebral artery branch vessel irregularity bilaterally. Right vertebral artery ends in a posterior inferior cerebellar artery distribution. No significant narrowing left vertebral artery. Moderate narrowing portions of the left posterior inferior cerebellar artery. Ectatic basilar artery without high-grade stenosis. Nonvisualized anterior inferior cerebellar arteries. Small left superior cerebellar artery. Posterior cerebral  artery distal branch vessel narrowing bilaterally. Electronically Signed: By: Genia Del M.D. On: 06/14/2015 19:34   Dg Chest Port 1 View  06/15/2015  CLINICAL DATA:  Acute respiratory failure EXAM: PORTABLE CHEST 1 VIEW COMPARISON:  Portable chest x-ray of 06/14/2015 FINDINGS: Aeration of the lungs has improved somewhat. The tip of the endotracheal tube appears to be approximately 3.8 cm above the carina. Opacity in the left perihilar and left mid lung is unchanged. Cardiomegaly is stable. Right sided Port-A-Cath is unchanged in position, and right IJ central venous line is unchanged. IMPRESSION: Slightly better aeration. Endotracheal tube tip 3.8 cm above the carina. Electronically Signed   By: Ivar Drape M.D.   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 Does the need for close, 24 hr/day medical supervision in concert with the patient's rehab needs make it unreasonable for this patient to be served in a less intensive setting? Yes 2. Co-Morbidities requiring supervision/potential complications: diabetes, coronary artery disease status post coronary  artery bypass graft, atrial fibrillation, was on L Quist stopped due to bleed 3. Due to bladder management, bowel management, safety, skin/wound care, disease management, medication administration, pain management and patient education, does the patient require 24 hr/day rehab nursing? Yes 4. Does the patient require coordinated care of a physician, rehab nurse, PT (1-2 hrs/day, 5 days/week), OT (1-2 hrs/day, 5 days/week) and SLP (.5-1 hrs/day, 5 days/week) to address physical and functional deficits in the context of the above medical diagnosis(es)? Yes Addressing deficits in the following areas: balance, endurance, locomotion, strength, transferring, bowel/bladder control, bathing, dressing, feeding, grooming, toileting, cognition and psychosocial support 5. Can the patient actively participate in an intensive therapy program of at least 3 hrs of therapy per day at least 5 days per week? Yes 6. The potential for patient to make measurable gains while on inpatient rehab is excellent 7. Anticipated functional outcomes upon discharge from inpatient rehab are min assist  with PT, min assist with OT, min assist with SLP. 8. Estimated rehab length of stay to reach the above functional goals is: 18-21d 9. Does the patient have adequate social supports and living environment to accommodate these discharge functional goals? Yes 10. Anticipated D/C setting: Home 11. Anticipated post D/C treatments: [place] therapy 12. Overall Rehab/Functional Prognosis: good  RECOMMENDATIONS: This patient's condition is appropriate for continued rehabilitative care in the following setting: CIR Patient has agreed to participate in recommended program. Yes Note that insurance prior authorization may be required for reimbursement for recommended care.  Comment: May initially need 15/7 schedule    06/16/2015

## 2015-06-16 NOTE — Progress Notes (Signed)
Inpatient Rehabilitation  Patient was screened by Dyan Labarbera for appropriateness for an Inpatient Acute Rehab consult.  At this time, we are recommending Inpatient Rehab consult.  Please order consult if you are agreeable.  Johncarlo Maalouf PT Inpatient Rehab Admissions Coordinator Cell [phone] Office [phone]    

## 2015-06-16 NOTE — Clinical Documentation Improvement (Signed)
Neurology Critical Care  Can the diagnosis of CKD be further specified? Please document findings in next progress note. Thank you!   CKD Stage I - GFR greater than or equal to 90  CKD Stage II - GFR 60-89  CKD Stage III - GFR 30-59  CKD Stage IV - GFR 15-29  CKD Stage V - GFR < 15  ESRD (End Stage Renal Disease)  Other condition  Unable to clinically determine  Supporting Information: : (risk factors, signs and symptoms, diagnostics, treatment)  White male  GFR's running for current admission range from 39 to 51  Please exercise your independent, professional judgment when responding. A specific answer is not anticipated or expected.  Thank You, Zoila Shutter RN, BSN, [place] [phone]; Cell: [phone]

## 2015-06-16 NOTE — Progress Notes (Signed)
Speech Language Pathology Treatment: Dysphagia  Patient Details Name: Ivan Beasley MRN: [id number] DOB: [birth year] Today's Date: 06/16/2015 Time: 8257-4935 SLP Time Calculation (min) (ACUTE ONLY): 11 min  Assessment / Plan / Recommendation Clinical Impression  Pt seen for followup dysphagia therapy. Pt required min verbal cueing to decrease bolus size, however no overt s/s aspiration noted. Pt managed trial of dry solid (cracker) with liquid wash to clear mild oral residue. Recommend: Continue with Dys 3/thin and supervision with meals. Will follow.    HPI HPI: 74 y.o. male patient with PMH: GERD, pna, CAD, DM, MI, cancer and chronic admitted with right-sided hemi-plegia. CT of the head revealed left frontal intracerebral hemorrhage, with subarachnoid hemorrhage as well adjacent to it. Intubated approximately 24-30 hours (extubated 6/6).      SLP Plan        Recommendations  Diet recommendations: Dysphagia 3 (mechanical soft);Thin liquid Liquids provided via: Cup;Straw Medication Administration: Whole meds with liquid Supervision: Patient able to self feed;Full supervision/cueing for compensatory strategies Compensations: Minimize environmental distractions;Slow rate;Small sips/bites Postural Changes and/or Swallow Maneuvers: Seated upright 90 degrees             Oral Care Recommendations: Oral care BID Follow up Recommendations: Inpatient Rehab     [place] MA, [place] Pager [phone] 06/16/2015, 4:45 PM

## 2015-06-16 NOTE — Evaluation (Signed)
Physical Therapy Evaluation Patient Details Name: Ivan Beasley MRN: [id number] DOB: [birth year] Today's Date: 06/16/2015   History of Present Illness  75 y.o. male who presented on 6/4 with right-sided hemi-plegia. PMHx: Metastatic lung cancer, Recent pneumonia, CAD, Arithritis, Back pain, R TKA, GERD, DM, Impaired hearing, Macular degeneration, MI. CT on 6/4 + for left frontal intracerebral hemorrhage, with subarachnoid hemorrhage as well adjacent to it. MRI on 6/6 showed posterior L frontal lobe hemorrhage w/ marked surrounding vasogenic edema with SAH, concern for metastatic lesion posterior margin of hmg and anterior L parietal lobe.    Clinical Impression  Pt did better with PT assessment compared to OT assessment which I believe shows that the more he gets up and moves the better he will do physically.  He even had some trace (non-reflexive) right finger flexion that was not there earlier.  He did better rolling and sitting up from his left side and I believe he will be ready to attempt standing next session.  Pt tolerated being lifted OOB to the chair with the maxi sky total lift well.   PT to follow acutely for deficits listed below.       Follow Up Recommendations CIR    Equipment Recommendations  Wheelchair cushion (measurements PT);3in1 (PT);Wheelchair (measurements PT);Hospital bed (hoyer lift, drop arm 3-in-1)    Recommendations for Other Services Rehab consult     Precautions / Restrictions Precautions Precautions: Fall Precaution Comments: R hemi, pt on home O2 PTA for recent pneumonia (but used it PRN, not 24/7)      Mobility  Bed Mobility Overal bed mobility: Needs Assistance;+2 for physical assistance Bed Mobility: Rolling;Sidelying to Sit Rolling: +2 for physical assistance;Max assist Sidelying to sit: +2 for physical assistance;Max assist       General bed mobility comments: Pt able to help pull trunk by pulling with left arm on bed rail.  Rolled to left side  and pt able to help push up onto left elbow.  Pt managing his left leg independently, needes assist to progress right leg to EOB.  Assist also to weight shift to scoot once sitting.   Transfers Overall transfer level: Needs assistance               General transfer comment: Total lift used to get pt OOB to chair for the benefits of being upright.  Lift sling donned in sitting EOB.    Ambulation/Gait             General Gait Details: unable at this time.       Modified Rankin (Stroke Patients Only) Modified Rankin (Stroke Patients Only) Pre-Morbid Rankin Score: No significant disability Modified Rankin: Severe disability     Balance Overall balance assessment: Needs assistance Sitting-balance support: Feet supported;Single extremity supported Sitting balance-Leahy Scale: Zero Sitting balance - Comments: Up to max assist seated EOB.  He did work towards very close supervision for <30 seconds due to eventual fatigue and posterior left LOB.  Pt was able to tell us which way he was leaning, but unable to correct without verbal cues to do so.  He was a bit of a pusher in sitting with left arm proped on bed or rail.  Did best for short periods of time with left hand on left knee (with cues to relax at the elbow vs push).  Postural control: Posterior lean;Left lateral lean  Pertinent Vitals/Pain Pain Assessment: No/denies pain    Home Living Family/patient expects to be discharged to:: Private residence Living Arrangements: Spouse/significant other Available Help at Discharge: Family;Available 24 hours/day Type of Home: House Home Access: Stairs to enter Entrance Stairs-Rails: None Entrance Stairs-Number of Steps: 1 Home Layout: One level Home Equipment: None      Prior Function Level of Independence: Independent               Hand Dominance   Dominant Hand: Right    Extremity/Trunk Assessment   Upper  Extremity Assessment: Defer to OT evaluation (pt was able to flex 2-4th fingers when asked to squeeze R UE)           Lower Extremity Assessment: RLE deficits/detail RLE Deficits / Details: right leg currently with dense hemipelgia.  No trace muscle activation of ankle (PF/DF), knee flex/ext, and hip flexion (ext not tested)    Cervical / Trunk Assessment: Kyphotic (with forward head)  Communication   Communication: HOH  Cognition Arousal/Alertness: Awake/alert Behavior During Therapy: WFL for tasks assessed/performed (better affect with PT) Overall Cognitive Status: Impaired/Different from baseline Area of Impairment: Awareness           Awareness: Emergent                 Assessment/Plan    PT Assessment Patient needs continued PT services  PT Diagnosis Difficulty walking;Abnormality of gait;Generalized weakness;Hemiplegia non-dominant side   PT Problem List Decreased strength;Decreased activity tolerance;Decreased balance;Decreased mobility;Decreased cognition;Decreased coordination;Decreased knowledge of use of DME;Decreased safety awareness;Decreased knowledge of precautions;Impaired tone;Impaired sensation;Obesity  PT Treatment Interventions DME instruction;Gait training;Stair training;Functional mobility training;Therapeutic activities;Therapeutic exercise;Balance training;Neuromuscular re-education;Cognitive remediation;Patient/family education;Wheelchair mobility training   PT Goals (Current goals can be found in the Care Plan section) Acute Rehab PT Goals Patient Stated Goal: to go home with his wife after all the rehab he can have PT Goal Formulation: With patient/family Time For Goal Achievement: 06/30/15 Potential to Achieve Goals: Good    Frequency Min 4X/week    End of Session   Activity Tolerance: Patient limited by fatigue Patient left: in chair;with call bell/phone within reach;with family/visitor present           Time: 1130-1204 PT Time  Calculation (min) (ACUTE ONLY): 34 min   Charges:   PT Evaluation $PT Eval Moderate Complexity: 1 Procedure PT Treatments $Therapeutic Activity: 8-22 mins        Minna Dumire B. [place], [place], DPT [phone]   06/16/2015, 3:09 PM

## 2015-06-16 NOTE — Evaluation (Addendum)
Occupational Therapy Evaluation Patient Details Name: Ivan Beasley MRN: [id number] DOB: Dec 18, 1940 Today's Date: 06/16/2015    History of Present Illness 75 y.o. male who presented on 6/4 with right-sided hemi-plegia. PMHx: Metastatic lung cancer, Recent pneumonia, CAD, Arithritis, Back pain, R TKA, GERD, DM, Impaired hearing, Macular degeneration, MI. CT on 6/4 + for left frontal intracerebral hemorrhage, with subarachnoid hemorrhage as well adjacent to it. MRI on 6/6 showed posterior L frontal lobe hemorrhage w/ marked surrounding vasogenic edema with SAH, concern for metastatic lesion posterior margin of hmg and anterior L parietal lobe.     Clinical Impression   Pt reports he was independent with ADLs and mobility PTA. Currently pt requiring max assist +2 for bed mobility. Overall pt will require max assist for ADLs at this time. Pt attempted self feeding activity with success using L hand; able to self feed 3 small bites then requested wife assist. Pt noted to have decreased safety awareness and insight into deficits, decreased functional use of R UE/LE, and cognitive deficits impacting his safety and independence with ADLs and functional mobility. Pt is very willing to participate in therapy and eager to regain functional independence. Recommending CIR level therapies for follow up in order to maximize independence and safety with ADLs and functional mobility prior to return home. Pt would benefit from continued skilled OT to address established goals.    Follow Up Recommendations  CIR;Supervision/Assistance - 24 hour    Equipment Recommendations  Other (comment) (TBD at next venue)    Recommendations for Other Services PT consult;Rehab consult     Precautions / Restrictions Precautions Precautions: Fall Precaution Comments: R hemi, pt on home 2L O2 PTA for recent pneumonia Restrictions Weight Bearing Restrictions: No      Mobility Bed Mobility Overal bed mobility: Needs  Assistance;+2 for physical assistance Bed Mobility: Rolling;Sidelying to Sit Rolling: Max assist Sidelying to sit: Max assist;+2 for physical assistance       General bed mobility comments: Pt able to reach for bed rail with L hand to assist with rolling. Pt able to advance L leg to EOB but required max assist for R LE, bringing LEs off EOB and at trunk for coming to upright position.  Transfers                 General transfer comment: Not assessed at this time.    Balance Overall balance assessment: Needs assistance Sitting-balance support: Bilateral upper extremity supported;Feet supported Sitting balance-Leahy Scale: Poor Sitting balance - Comments: Requires total assist for maintaining upright posture. Postural control: Right lateral lean, Posterior lean                                  ADL Overall ADL's : Needs assistance/impaired Eating/Feeding: Maximal assistance;Sitting Eating/Feeding Details (indicate cue type and reason): Pt attempted to self feed with L hand; able to take small bites x3 then requesting wife to assist.  Grooming: Maximal assistance;Bed level   Upper Body Bathing: Maximal assistance;Sitting   Lower Body Bathing: Total assistance;Bed level   Upper Body Dressing : Maximal assistance;Sitting   Lower Body Dressing: Total assistance;Bed level                 General ADL Comments: Pt with decreased trunk control and unable to sit EOB without total assist for support and balance. Did not attempt transfers at this time. Pt tearful at times when discussing OT plan of  care and d/c recommendations.     Vision Vision Assessment?: Vision impaired- to be further tested in functional context;Yes Additional Comments: to be further assessed. Inconsistent responses with formal visual testing. Reports vision has changed (increased blurriness) but cannot identify a timeframe for vision changes.   Perception     Praxis      Pertinent  Vitals/Pain Pain Assessment: No/denies pain     Hand Dominance Right   Extremity/Trunk Assessment Upper Extremity Assessment Upper Extremity Assessment: RUE deficits/detail RUE Deficits / Details: No AROM in R UE. PROM WFL. Pt/wife reports he was able to wiggle his thumb last night and earlier today. Pt reports no changes in sensation. RUE Coordination: decreased fine motor;decreased gross motor   Lower Extremity Assessment Lower Extremity Assessment: Defer to PT evaluation   Cervical / Trunk Assessment Cervical / Trunk Assessment: Kyphotic (forward head posture)   Communication Communication Communication: HOH   Cognition Arousal/Alertness: Awake/alert Behavior During Therapy: Flat affect (Tearful at times) Overall Cognitive Status: Impaired/Different from baseline (to be further assessed)                     General Comments       Exercises       Shoulder Instructions      Home Living Family/patient expects to be discharged to:: Private residence Living Arrangements: Spouse/significant other Available Help at Discharge: Family Type of Home: House Home Access: Stairs to enter Technical brewer of Steps: 2   Home Layout: One level     Bathroom Shower/Tub: Tub/shower unit Shower/tub characteristics: Architectural technologist: Standard     Home Equipment: None          Prior Functioning/Environment Level of Independence: Independent             OT Diagnosis: Generalized weakness;Cognitive deficits;Disturbance of vision;Hemiplegia dominant side;Altered mental status   OT Problem List: Decreased strength;Decreased range of motion;Decreased activity tolerance;Impaired balance (sitting and/or standing);Impaired vision/perception;Decreased coordination;Decreased cognition;Decreased safety awareness;Decreased knowledge of use of DME or AE;Decreased knowledge of precautions;Impaired tone;Obesity;Impaired UE functional use   OT  Treatment/Interventions: Self-care/ADL training;Therapeutic exercise;Neuromuscular education;Energy conservation;DME and/or AE instruction;Therapeutic activities;Cognitive remediation/compensation;Visual/perceptual remediation/compensation;Patient/family education;Balance training    OT Goals(Current goals can be found in the care plan section) Acute Rehab OT Goals Patient Stated Goal: get better and return to being independent OT Goal Formulation: With patient/family Time For Goal Achievement: 06/30/15 Potential to Achieve Goals: Good ADL Goals Pt Will Perform Grooming: with min assist;sitting Pt Will Perform Upper Body Bathing: with min assist;sitting Pt Will Perform Lower Body Bathing: with min assist;sitting/lateral leans Additional ADL Goal #1: Pt will sit EOB with min guard assist for 5 minutes as precursor for ADLs.   OT Frequency: Min 3X/week   Barriers to D/C:            Co-evaluation              End of Session Equipment Utilized During Treatment: Oxygen Nurse Communication: Mobility status;Need for lift equipment  Activity Tolerance: Patient tolerated treatment well Patient left: in bed;with call bell/phone within reach;with family/visitor present   Time: 6073-7106 OT Time Calculation (min): 37 min Charges:  OT General Charges $OT Visit: 1 Procedure OT Evaluation $OT Eval Moderate Complexity: 1 Procedure OT Treatments $Therapeutic Activity: 8-22 mins G-Codes:      Binnie Kand M.S., OTR/L Pager: [phone]  06/16/2015, 10:14 AM

## 2015-06-17 ENCOUNTER — Inpatient Hospital Stay (HOSPITAL_COMMUNITY): Payer: Medicare HMO

## 2015-06-17 DIAGNOSIS — I611 Nontraumatic intracerebral hemorrhage in hemisphere, cortical: Secondary | ICD-10-CM

## 2015-06-17 DIAGNOSIS — E1159 Type 2 diabetes mellitus with other circulatory complications: Secondary | ICD-10-CM

## 2015-06-17 LAB — GLUCOSE, CAPILLARY
GLUCOSE-CAPILLARY: 127 mg/dL — AB (ref 65–99)
GLUCOSE-CAPILLARY: 120 mg/dL — AB (ref 65–99)
Glucose-Capillary: 104 mg/dL — ABNORMAL HIGH (ref 65–99)
Glucose-Capillary: 117 mg/dL — ABNORMAL HIGH (ref 65–99)
Glucose-Capillary: 134 mg/dL — ABNORMAL HIGH (ref 65–99)
Glucose-Capillary: 139 mg/dL — ABNORMAL HIGH (ref 65–99)
Glucose-Capillary: 141 mg/dL — ABNORMAL HIGH (ref 65–99)

## 2015-06-17 LAB — BASIC METABOLIC PANEL
ANION GAP: 9 (ref 5–15)
BUN: 23 mg/dL — ABNORMAL HIGH (ref 6–20)
CO2: 23 mmol/L (ref 22–32)
Calcium: 9.4 mg/dL (ref 8.9–10.3)
Chloride: 104 mmol/L (ref 101–111)
Creatinine, Ser: 1.46 mg/dL — ABNORMAL HIGH (ref 0.61–1.24)
GFR calc Af Amer: 52 mL/min — ABNORMAL LOW (ref >60)
GFR calc non Af Amer: 45 mL/min — ABNORMAL LOW (ref >60)
GLUCOSE: 128 mg/dL — AB (ref 65–99)
POTASSIUM: 4.3 mmol/L (ref 3.5–5.1)
Sodium: 136 mmol/L (ref 135–145)

## 2015-06-17 LAB — CBC
HEMATOCRIT: 25.6 % — AB (ref 39.0–52.0)
Hemoglobin: 7.8 g/dL — ABNORMAL LOW (ref 13.0–17.0)
MCH: 26.5 pg (ref 26.0–34.0)
MCHC: 30.5 g/dL (ref 30.0–36.0)
MCV: 87.1 fL (ref 78.0–100.0)
Platelets: 188 10*3/uL (ref 150–400)
RBC: 2.94 MIL/uL — AB (ref 4.22–5.81)
RDW: 19.9 % — AB (ref 11.5–15.5)
WBC: 9.8 10*3/uL (ref 4.0–10.5)

## 2015-06-17 NOTE — Progress Notes (Signed)
VASCULAR LAB PRELIMINARY  PRELIMINARY  PRELIMINARY  PRELIMINARY  Carotid duplex completed.    Preliminary report:  Right: 1-39% ICA stenosis.  Left: 1-39% ICA stenosis, however, higher velocities may be obscured by significant calcific plaque at the origin of the left ICA.  Bilateral vertebral artery flow is antegrade.   Halton Neas, RVT 06/17/2015, 9:08 AM

## 2015-06-17 NOTE — Progress Notes (Signed)
   06/17/15 1500  Clinical Encounter Type  Visited With Patient and family together  Visit Type Follow-up;Spiritual support  Referral From Family  Spiritual Encounters  Spiritual Needs Prayer  Stress Factors  Patient Stress Factors Exhausted;Health changes  Family Stress Factors Health changes   Chaplain provided follow-up care. Patient is feeling better and reported not feeling confused. Patient also reported hearing church music, and has been hearing said music since he first became ill with pneumonia. Chaplain offered prayer and support. Spiritual care services available as needed.   Jeri Lager, Chaplain 06/17/2015 3:01 PM

## 2015-06-17 NOTE — H&P (Signed)
Physical Medicine and Rehabilitation Admission H&P    Chief complaint: Weakness  HPI: Ivan MCELRATH is a 75 y.o. 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 Per chart review patient is married and independent prior to admission and driving short distances. One level home with 2 steps to entry. Presented with 06/13/2015 to [facility] with altered mental status and right-sided weakness. CT of the head revealed left frontal intracerebral hemorrhage with subarachnoid hemorrhage. Patient's eliquis was discontinued. Patient was transferred to [facility] for further evaluation. MRI of the brain showed posterior left frontal lobe complex 2.5 x 2.2 x 2 cm hemorrhagic lesion with marked surrounding vasogenic edema with breakthrough into sulci with subarachnoid hemorrhage noted. Maintained on Keppra for seizure prophylaxis. EEG mild generalized nonspecific cerebral dysfunction consistent with encephalopathy. Recent echocardiogram with ejection fraction of 50% without embolus. Lower extremity Dopplers negative. Plan to repeat MRI for further evaluation of possible metastases posterior left frontal lobe after ICH absorbed. No current plan to resume eliquis at this time. Currently on a mechanical soft thin liquid diet. Physical and Occupational therapy evaluations completed with recommendations of physical medicine rehabilitation consult. Patient was admitted for comprehensive rehabilitation program.  ROS Constitutional: Negative for fever and chills.  HENT: Positive for hearing loss.  Eyes: Negative for blurred vision and double vision.  Respiratory: Positive for cough.  Negative for shortness of breath.  Cardiovascular: Positive for palpitations and leg swelling. Negative for chest pain.  Gastrointestinal: Positive for constipation. Negative for nausea and vomiting.   GERD  Genitourinary: Positive for frequency. Negative for dysuria and hematuria.  Musculoskeletal: Positive for myalgias and back pain.  Skin: Negative for rash.  Neurological: Positive for dizziness, weakness and headaches. Negative for loss of consciousness.  All other systems reviewed and are negative   Past Medical History  Diagnosis Date  . Coronary artery disease   . Pneumonia 2000  . Arthritis   . 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 Bruises easily   . GERD (gastroesophageal reflux disease)     takes Omeprazole daily as needed for stomach pain  . Blood transfusion 2001  . Diabetes mellitus     takes Metformin daily;  . Impaired hearing     bil hearing aide  . Macular degeneration     being watched for this but hasn't been "completely" diagnosed  . Myocardial infarction ([place]) 2001  . Cancer [facility])    Past Surgical History  Procedure Laterality Date  . 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 Cardiac catheterization  2001  . Tonsillectomy      as a child  . Colonoscopy    . Cataract extraction      bilateral  . Lumbar laminectomy/decompression microdiscectomy  12/19/2010    Procedure: LUMBAR LAMINECTOMY/DECOMPRESSION MICRODISCECTOMY;  Surgeon: Elaina Hoops;  Location: [facility];  Service: Neurosurgery;  Laterality: N/A;  Lumbar three-four, four-five decompressive lumbar laminectomy  . Joint replacement  right tkr  . Eye surgery      cataracts  . Back surgery      l4 5  . Portacath placement N/A 05/20/2014    Procedure: INSERTION PORT-A-CATH;  Surgeon:  Nestor Lewandowsky, MD;  Location: [facility] ORS;  Service: General;  Laterality: N/A;   Family History  Problem Relation Age of Onset  . Anesthesia problems Neg Hx   . Hypotension Neg Hx   .  Malignant hyperthermia Neg Hx   . Pseudochol deficiency Neg Hx   . Diabetes type II Other    Social History:  reports that he quit smoking about 16 years ago. His smoking use included Cigarettes. He quit after 40 years of use. He has never used smokeless tobacco. He reports that he does not drink alcohol or use illicit drugs. Allergies: No Known Allergies Medications Prior to Admission  Medication Sig Dispense Refill  . albuterol (PROVENTIL) (2.5 MG/3ML) 0.083% nebulizer solution Take 3 mLs (2.5 mg total) by nebulization every 6 (six) hours as needed for wheezing or shortness of breath. 75 mL 2  . ALPRAZolam (XANAX) 0.25 MG tablet Take 1 tablet (0.25 mg total) by mouth 3 (three) times daily as needed for anxiety. 20 tablet 0  . amiodarone (PACERONE) 200 MG tablet Take 1 tablet (200 mg total) by mouth daily. 30 tablet 0  . amoxicillin-clavulanate (AUGMENTIN) 875-125 MG tablet Take 1 tablet by mouth 2 (two) times daily. 20 tablet 0  . apixaban (ELIQUIS) 5 MG TABS tablet Take 1 tablet (5 mg total) by mouth 2 (two) times daily. 60 tablet 0  . benzonatate (TESSALON) 100 MG capsule TAKE 1 CAPSULE (100 MG TOTAL) BY MOUTH 3 (THREE) TIMES DAILY AS NEEDED FOR COUGH. 90 capsule 3  . bismuth subsalicylate (KAOPECTATE) 262 MG/15ML suspension Take 30 mLs by mouth 3 (three) times daily as needed. 360 mL 0  . Blood Glucose Calibration (TRUETEST CONTROL LEVEL 1) LIQD Use as directed. Reported on 04/17/2015    . diltiazem (CARDIZEM) 60 MG tablet Take 60 mg by mouth 2 (two) times daily.  6  . diphenoxylate-atropine (LOMOTIL) 2.5-0.025 MG tablet TAKE 1 TABLET EVERY 4 HOURS AS NEEDED FOR DIARRHEA /LOOSE STOOL 45 tablet 1  . glimepiride (AMARYL) 1 MG tablet Take 1 mg by mouth daily.     Marland Kitchen glucose blood test strip Use as directed.    Elmore Guise Devices (TRUEDRAW LANCING DEVICE) MISC Use as directed.    . lidocaine-prilocaine (EMLA) cream Apply 1 application topically See admin instructions. 1 application to site area  topically as directed.    . magnesium oxide (MAG-OX) 400 MG tablet Take 400 mg by mouth daily.      . Melatonin 5 MG CAPS Take 1 capsule (5 mg total) by mouth at bedtime as needed.  0  . metFORMIN (GLUCOPHAGE) 500 MG tablet Take 500-1,000 mg by mouth See admin instructions. Take 1000 mg by mouth in the morning,and take 500 mg by mouth every night at bedtime.    Marland Kitchen omeprazole (PRILOSEC) 40 MG capsule Take 40 mg by mouth daily.     Marland Kitchen oxyCODONE (ROXICODONE) 5 MG immediate release tablet Take 1 tablet (5 mg total) by mouth every 6 (six) hours as needed for severe pain. 30 tablet 0  . predniSONE (DELTASONE) 10 MG tablet Take 6 tablets (60 mg total) by mouth daily with breakfast. Taper by 86m every day until complete 21 tablet 0  . SYMBICORT 160-4.5 MCG/ACT inhaler Inhale 2 puffs into the lungs 2 (two) times daily.   12  . tiotropium (SPIRIVA) 18 MCG inhalation capsule Place 1 capsule (18 mcg total) into inhaler and inhale daily. 30 capsule 0  . traMADol (ULTRAM) 50 MG tablet Take 50 mg by  mouth every 6 (six) hours as needed for moderate pain or severe pain.       Home: Home Living Family/patient expects to be discharged to:: Private residence Living Arrangements: Spouse/significant other Available Help at Discharge: Family, Available 24 hours/day Type of Home: House Home Access: Stairs to enter Technical brewer of Steps: 1 Entrance Stairs-Rails: None Home Layout: One level Bathroom Shower/Tub: Government social research officer Accessibility: No (not sure that a WC would fit into the bathroom) Home Equipment: None  Lives With: Spouse   Functional History: Prior Function Level of Independence: Independent  Functional Status:  Mobility: Bed Mobility Overal bed mobility: Needs Assistance, +2 for physical assistance Bed Mobility: Rolling, Sidelying to Sit Rolling: +2 for physical assistance, Max assist Sidelying to sit: +2 for physical assistance, Max  assist General bed mobility comments: Pt able to help pull trunk by pulling with left arm on bed rail.  Rolled to left side and pt able to help push up onto left elbow.  Pt managing his left leg independently, needes assist to progress right leg to EOB.  Assist also to weight shift to scoot once sitting.  Transfers Overall transfer level: Needs assistance Transfer via Lift Equipment: [place] transfer comment: Total lift used to get pt OOB to chair for the benefits of being upright.  Lift sling donned in sitting EOB.   Ambulation/Gait General Gait Details: unable at this time.     ADL: ADL Overall ADL's : Needs assistance/impaired Eating/Feeding: Maximal assistance, Sitting Eating/Feeding Details (indicate cue type and reason): Pt attempted to self feed with L hand; able to take small bites x3 then requesting wife to assist.  Grooming: Maximal assistance, Bed level Upper Body Bathing: Maximal assistance, Sitting Lower Body Bathing: Total assistance, Bed level Upper Body Dressing : Maximal assistance, Sitting Lower Body Dressing: Total assistance, Bed level General ADL Comments: Pt with decreased trunk control and unable to sit EOB without max assist for support and balance. Did not attempt transfers at this time. Pt tearful at times when discussing OT plan of care and d/c recommendations.  Cognition: Cognition Overall Cognitive Status: Impaired/Different from baseline Arousal/Alertness: Awake/alert Orientation Level: Oriented X4 Attention: Sustained Sustained Attention: Impaired Sustained Attention Impairment: Functional basic Memory:  ( recalled 2 items after 60  sec, needs further testing) Awareness: Impaired Awareness Impairment: Intellectual impairment, Emergent impairment, Anticipatory impairment Problem Solving: Impaired Problem Solving Impairment: Verbal basic, Functional basic Safety/Judgment: Impaired Cognition Arousal/Alertness: Awake/alert Behavior During  Therapy: WFL for tasks assessed/performed (better affect with PT) Overall Cognitive Status: Impaired/Different from baseline Area of Impairment: Awareness Current Attention Level: Focused Safety/Judgement: Decreased awareness of safety, Decreased awareness of deficits Awareness: Emergent Problem Solving: Requires verbal cues  Physical Exam: Blood pressure 127/68, pulse 88, temperature 98.4 F (36.9 C), temperature source Oral, resp. rate 18, height 6' (1.829 m), weight 94.5 kg (208 lb 5.4 oz), SpO2 98 %. Physical Exam Vitals reviewed. Gen: more alert today, sitting to right side HENT: oral mucosa moist Head: Normocephalic.  Eyes: EOM are normal.  Neck: Normal range of motion. Neck supple. No thyromegaly present.  Cardiovascular:  Cardiac rate controlled  Respiratory: Effort normal and breath sounds normal. No wheezes or rales.  GI: Soft. Bowel sounds are normal. He exhibits no distension.  Neurological: He is alert.   He is oriented to person place as well as his date of birth. Follow   Skin: Skin is warm and dry. Right central 7 1+ right finger flexors otherwise 0/5 in the right  upper extremity at the deltoid and bicep tricep and finger extensors. tr/5 in the right hip flexors and 0/5 knee extensors ankle dorsiflexor and plantar flexor.  5/5 in the left deltoid, biceps, triceps, grip, hip flexor, knee extensor, ankle dorsiflexor Sensation is absent to light touch in the right upper and right lower limb. He does sense and can differentiate a pinch in the right arm and leg. Psych: flat but cooperative  Results for orders placed or performed during the hospital encounter of 06/13/15 (from the past 48 hour(s))  Glucose, capillary     Status: Abnormal   Collection Time: 06/15/15  7:46 AM  Result Value Ref Range   Glucose-Capillary 178 (H) 65 - 99 mg/dL   Comment 1 Notify RN    Comment 2 Document in Chart   Glucose, capillary     Status: Abnormal   Collection Time: 06/15/15 11:16 AM   Result Value Ref Range   Glucose-Capillary 193 (H) 65 - 99 mg/dL   Comment 1 Notify RN    Comment 2 Document in Chart   Glucose, capillary     Status: Abnormal   Collection Time: 06/15/15  3:37 PM  Result Value Ref Range   Glucose-Capillary 260 (H) 65 - 99 mg/dL   Comment 1 Notify RN    Comment 2 Document in Chart   Glucose, capillary     Status: Abnormal   Collection Time: 06/15/15  7:30 PM  Result Value Ref Range   Glucose-Capillary 260 (H) 65 - 99 mg/dL  Glucose, capillary     Status: Abnormal   Collection Time: 06/15/15 11:07 PM  Result Value Ref Range   Glucose-Capillary 207 (H) 65 - 99 mg/dL  Glucose, capillary     Status: Abnormal   Collection Time: 06/16/15  3:25 AM  Result Value Ref Range   Glucose-Capillary 136 (H) 65 - 99 mg/dL  Procalcitonin     Status: None   Collection Time: 06/16/15  5:11 AM  Result Value Ref Range   Procalcitonin 0.12 ng/mL    Comment:        Interpretation: PCT (Procalcitonin) <= 0.5 ng/mL: Systemic infection (sepsis) is not likely. Local bacterial infection is possible. (NOTE)         ICU PCT Algorithm               Non ICU PCT Algorithm    ----------------------------     ------------------------------         PCT < 0.25 ng/mL                 PCT < 0.1 ng/mL     Stopping of antibiotics            Stopping of antibiotics       strongly encouraged.               strongly encouraged.    ----------------------------     ------------------------------       PCT level decrease by               PCT < 0.25 ng/mL       >= 80% from peak PCT       OR PCT 0.25 - 0.5 ng/mL          Stopping of antibiotics  encouraged.     Stopping of antibiotics           encouraged.    ----------------------------     ------------------------------       PCT level decrease by              PCT >= 0.25 ng/mL       < 80% from peak PCT        AND PCT >= 0.5 ng/mL            Continuin g antibiotics                                               encouraged.       Continuing antibiotics            encouraged.    ----------------------------     ------------------------------     PCT level increase compared          PCT > 0.5 ng/mL         with peak PCT AND          PCT >= 0.5 ng/mL             Escalation of antibiotics                                          strongly encouraged.      Escalation of antibiotics        strongly encouraged.   CBC     Status: Abnormal   Collection Time: 06/16/15  6:44 AM  Result Value Ref Range   WBC 11.3 (H) 4.0 - 10.5 K/uL   RBC 2.82 (L) 4.22 - 5.81 MIL/uL   Hemoglobin 7.5 (L) 13.0 - 17.0 g/dL   HCT 24.6 (L) 39.0 - 52.0 %   MCV 87.2 78.0 - 100.0 fL   MCH 26.6 26.0 - 34.0 pg   MCHC 30.5 30.0 - 36.0 g/dL   RDW 19.9 (H) 11.5 - 15.5 %   Platelets 175 150 - 400 K/uL  Basic metabolic panel     Status: Abnormal   Collection Time: 06/16/15  6:44 AM  Result Value Ref Range   Sodium 137 135 - 145 mmol/L   Potassium 4.8 3.5 - 5.1 mmol/L   Chloride 104 101 - 111 mmol/L   CO2 23 22 - 32 mmol/L   Glucose, Bld 176 (H) 65 - 99 mg/dL   BUN 17 6 - 20 mg/dL   Creatinine, Ser 1.41 (H) 0.61 - 1.24 mg/dL   Calcium 9.3 8.9 - 10.3 mg/dL   GFR calc non Af Amer 47 (L) >60 mL/min   GFR calc Af Amer 55 (L) >60 mL/min    Comment: (NOTE) The eGFR has been calculated using the CKD EPI equation. This calculation has not been validated in all clinical situations. eGFR's persistently <60 mL/min signify possible Chronic Kidney Disease.    Anion gap 10 5 - 15  Glucose, capillary     Status: Abnormal   Collection Time: 06/16/15  8:05 AM  Result Value Ref Range   Glucose-Capillary 185 (H) 65 - 99 mg/dL   Comment 1 Document in Chart   Glucose, capillary     Status: Abnormal   Collection Time: 06/16/15 12:18 PM  Result  Value Ref Range   Glucose-Capillary 329 (H) 65 - 99 mg/dL   Comment 1 Document in Chart   Glucose, capillary     Status: Abnormal   Collection Time: 06/16/15  4:02 PM  Result  Value Ref Range   Glucose-Capillary 200 (H) 65 - 99 mg/dL  Glucose, capillary     Status: Abnormal   Collection Time: 06/16/15  7:55 PM  Result Value Ref Range   Glucose-Capillary 106 (H) 65 - 99 mg/dL  Glucose, capillary     Status: Abnormal   Collection Time: 06/17/15 12:06 AM  Result Value Ref Range   Glucose-Capillary 141 (H) 65 - 99 mg/dL   Comment 1 Notify RN    Comment 2 Document in Chart   Glucose, capillary     Status: Abnormal   Collection Time: 06/17/15  3:35 AM  Result Value Ref Range   Glucose-Capillary 127 (H) 65 - 99 mg/dL   Comment 1 Notify RN    Comment 2 Document in Chart   CBC     Status: Abnormal   Collection Time: 06/17/15  4:20 AM  Result Value Ref Range   WBC 9.8 4.0 - 10.5 K/uL   RBC 2.94 (L) 4.22 - 5.81 MIL/uL   Hemoglobin 7.8 (L) 13.0 - 17.0 g/dL   HCT 25.6 (L) 39.0 - 52.0 %   MCV 87.1 78.0 - 100.0 fL   MCH 26.5 26.0 - 34.0 pg   MCHC 30.5 30.0 - 36.0 g/dL   RDW 19.9 (H) 11.5 - 15.5 %   Platelets 188 150 - 400 K/uL  Basic metabolic panel     Status: Abnormal   Collection Time: 06/17/15  4:20 AM  Result Value Ref Range   Sodium 136 135 - 145 mmol/L   Potassium 4.3 3.5 - 5.1 mmol/L   Chloride 104 101 - 111 mmol/L   CO2 23 22 - 32 mmol/L   Glucose, Bld 128 (H) 65 - 99 mg/dL   BUN 23 (H) 6 - 20 mg/dL   Creatinine, Ser 1.46 (H) 0.61 - 1.24 mg/dL   Calcium 9.4 8.9 - 10.3 mg/dL   GFR calc non Af Amer 45 (L) >60 mL/min   GFR calc Af Amer 52 (L) >60 mL/min    Comment: (NOTE) The eGFR has been calculated using the CKD EPI equation. This calculation has not been validated in all clinical situations. eGFR's persistently <60 mL/min signify possible Chronic Kidney Disease.    Anion gap 9 5 - 15   Dg Chest Port 1 View  06/16/2015  CLINICAL DATA:  75 year old male with lung cancer. No chest complaint at this time. EXAM: PORTABLE CHEST 1 VIEW COMPARISON:  Chest radiograph dated 06/15/2015 FINDINGS: There has been interval removal of the endotracheal and  enteric tube. The stable area of density is again noted in the left mid lung field. Multiple surgical clips arms in this area. No new consolidation identified. Overall there is improved aeration of the left lower lung field since the prior study. The right lung is clear. There is no pleural effusion or pneumothorax. Stable cardiac silhouette. Median sternotomy wires and CABG vascular clips noted. Right pectoral Port-A-Cath with tip in stable positioning. No acute osseous pathology. IMPRESSION: Interval removal of the endotracheal and enteric tube. Stable appearing density in the left mid lung field with overall improvement of the aeration of the left lung. Electronically Signed   By: Anner Crete M.D.   On: 06/16/2015 19:54       Medical Problem  List and Plan: 1.  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 Plan to follow-up MRI of the brain after ICH absorbed for further evaluation of possible metastases posterior left frontal lobe  -begin CIR therapies 2.  DVT Prophylaxis/Anticoagulation: SCDs. Monitor for signs of DVT 3. Pain Management: Ultram and oxycodone as needed 4. Mood: Xanax 0.25 mg 3 times daily as needed 5. Neuropsych: This patient is capable of making decisions on his own behalf. 6. Skin/Wound Care: Routine skin checks 7. Fluids/Electrolytes/Nutrition: Routine I&O's with follow-up chemistries 8. Seizure prophylaxis. Keppra now at 1000 mg twice a day---more alert. EEG displayed seizure activity 9. Atrial fibrillation. Cardiac rate control. Continue amiodarone 200 mg daily, Cardizem 60 mg twice a day.Eliquis held secondary to [place] 10. 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 As noted plan follow-up MRI after ICH absorbed for further evaluation of possible metastasis 11. Diabetes mellitus with peripheral neuropathy. Hemoglobin A1c 6.8. Glucophage 1000 mg at breakfast and 500  mg supper, Amaryl 1 mg daily. Check blood sugars before meals and at bedtime 12. Hyperlipidemia. Lipitor  Post Admission Physician Evaluation: 1. Functional deficits secondary  to left frontal ICH/SAH. 2. Patient is admitted to receive collaborative, interdisciplinary care between the physiatrist, rehab nursing staff, and therapy team. 3. Patient's level of medical complexity and substantial therapy needs in context of that medical necessity cannot be provided at a lesser intensity of care such as a SNF. 4. Patient has experienced substantial functional loss from his/her baseline which was documented above under the "Functional History" and "Functional Status" headings.  Judging by the patient's diagnosis, physical exam, and functional history, the patient has potential for functional progress which will result in measurable gains while on inpatient rehab.  These gains will be of substantial and practical use upon discharge  in facilitating mobility and self-care at the household level. 5. Physiatrist will provide 24 hour management of medical needs as well as oversight of the therapy plan/treatment and provide guidance as appropriate regarding the interaction of the two. 6. 24 hour rehab nursing will assist with bladder management, bowel management, safety, skin/wound care, disease management, medication administration, pain management and patient education  and help integrate therapy concepts, techniques,education, etc. 7. PT will assess and treat for/with: Lower extremity strength, range of motion, stamina, balance, functional mobility, safety, adaptive techniques and equipment, NMR, w/c assessment, family education, ego support.   Goals are: min to mod assist. 8. OT will assess and treat for/with: ADL's, functional mobility, safety, upper extremity strength, adaptive techniques and equipment, NMR, ego support, community reintegration, education.   Goals are: min to mod assist. Therapy may proceed with  showering this patient. 9. SLP will assess and treat for/with: speech, swallowing, cognition, communication.  Goals are: supervision to min assist. 10. Case Management and Social Worker will assess and treat for psychological issues and discharge planning. 11. Team conference will be held weekly to assess progress toward goals and to determine barriers to discharge. 12. Patient will receive at least 3 hours of therapy per day at least 5 days per week. 13. ELOS: 20-25 days       14. Prognosis:  good     Meredith Staggers, MD, [facility] 06/25/2015

## 2015-06-17 NOTE — Progress Notes (Signed)
Physical Therapy Treatment Patient Details Name: Ivan Beasley MRN: [id number] DOB: 03-05-1940 Today's Date: 06/17/2015    History of Present Illness 75 y.o. male who presented on 6/4 with right-sided hemi-plegia. PMHx: Metastatic lung cancer, Recent pneumonia, CAD, Arithritis, Back pain, R TKA, GERD, DM, Impaired hearing, Macular degeneration, MI. CT on 6/4 + for left frontal intracerebral hemorrhage, with subarachnoid hemorrhage as well adjacent to it. MRI on 6/6 showed posterior L frontal lobe hemorrhage w/ marked surrounding vasogenic edema with SAH, concern for metastatic lesion posterior margin of hmg and anterior L parietal lobe.      PT Comments    Patient progressing with OOB via squat pivot this session and able to work on seated balance as well as activating R LE in standing.  Wife remains very supportive, but seems exhausted.  Continue to agree with CIR level rehab at d/c.  Follow Up Recommendations  CIR     Equipment Recommendations  Wheelchair cushion (measurements PT);3in1 (PT);Wheelchair (measurements PT);Hospital bed    Recommendations for Other Services       Precautions / Restrictions Precautions Precautions: Fall Precaution Comments: R hemi, pt on home O2 PTA for recent pneumonia (but used it PRN, not 24/7)    Mobility  Bed Mobility Overal bed mobility: Needs Assistance;+2 for physical assistance Bed Mobility: Rolling;Sidelying to Sit Rolling: Mod assist Sidelying to sit: Max assist       General bed mobility comments: able to sit up with +1 A, but helped R leg off bed and lifted trunk upright cued to push with rail  Transfers Overall transfer level: Needs assistance Equipment used: 2 person hand held assist Transfers: Sit to/from Stand;Stand Pivot Transfers Sit to Stand: From elevated surface;+2 physical assistance;Max assist Stand pivot transfers: +2 physical assistance;Max assist       General transfer comment: assist to stand with R knee  blocked and using pad under pt to assist with sit to stand facilitation at hip and under axilla on R for coming up as erect as possible; maintained maybe 5-6 seconds, then pivot to chair with assist for moving hips again using bed pad under pt, was able to weight bear with this transfer  Ambulation/Gait                 Stairs            Wheelchair Mobility    Modified Rankin (Stroke Patients Only) Modified Rankin (Stroke Patients Only) Pre-Morbid Rankin Score: No significant disability Modified Rankin: Severe disability     Balance Overall balance assessment: Needs assistance   Sitting balance-Leahy Scale: Poor Sitting balance - Comments: falling initially to R and anterior, then positioned more to L and pt with LOB posterior, able to maintain sitting for 4 seconds when leaning to L     Standing balance-Leahy Scale: Zero Standing balance comment: +2 assist for standing                    Cognition Arousal/Alertness: Awake/alert Behavior During Therapy: Flat affect Overall Cognitive Status: Impaired/Different from baseline     Current Attention Level: Sustained     Safety/Judgement: Decreased awareness of safety;Decreased awareness of deficits          Exercises General Exercises - Lower Extremity Short Arc Quad: AROM;Left;5 reps;Supine Heel Slides: AROM;Left;5 reps;Supine Other Exercises Other Exercises: stretching for R LE x 1-3 reps 10-20 sec hold for gluts, hamstrings and hip rotators    General Comments General comments (skin integrity, edema, etc.): wife  present throughout session      Pertinent Vitals/Pain Pain Assessment: No/denies pain    Home Living                      Prior Function            PT Goals (current goals can now be found in the care plan section) Progress towards PT goals: Progressing toward goals    Frequency  Min 4X/week    PT Plan Current plan remains appropriate    Co-evaluation              End of Session Equipment Utilized During Treatment: Gait belt Activity Tolerance: Patient tolerated treatment well Patient left: in chair;with call bell/phone within reach;with chair alarm set;with family/visitor present     Time: 1530-1553 PT Time Calculation (min) (ACUTE ONLY): 23 min  Charges:  $Therapeutic Activity: 23-37 mins                    G CodesReginia Naas 06/30/2015, 5:23 PM  Magda Kiel, [place] 2015/06/30

## 2015-06-17 NOTE — Care Management Important Message (Signed)
Important Message  Patient Details  Name: Ivan Beasley MRN: [id number] Date of Birth: 1940/12/12   Medicare Important Message Given:  Yes    Loann Quill 06/17/2015, 10:19 AM

## 2015-06-17 NOTE — Progress Notes (Signed)
Inpatient Rehabilitation  Met with patient to discuss team's recommendation for IP Rehab.  I shared booklets and answered questions.  I will initiate insurance authorization for approval.  Plan to follow along for timing of medical readiness, insurance authorization, and bed availability.  Please call with questions.    Carmelia Roller., CCC/SLP Admission Coordinator  [place]  Cell [phone]

## 2015-06-17 NOTE — Progress Notes (Signed)
STROKE TEAM PROGRESS NOTE   SUBJECTIVE (INTERVAL HISTORY) Wife not at bedside today. He is stable, on acute issue over night. Pending CIR. He still has right hemiplegia.    OBJECTIVE Temp:  [97.5 F (36.4 C)-98.5 F (36.9 C)] 98.4 F (36.9 C) (06/08 0559) Pulse Rate:  [85-103] 88 (06/08 0559) Cardiac Rhythm:  [-] Normal sinus rhythm (06/08 0700) Resp:  [17-23] 18 (06/08 0559) BP: (110-154)/(53-72) 127/68 mmHg (06/08 0559) SpO2:  [89 %-100 %] 89 % (06/08 0737)  CBC:   Recent Labs Lab 06/13/15 0934  06/16/15 0644 06/17/15 0420  WBC 5.9  < > 11.3* 9.8  NEUTROABS 4.6  --   --   --   HGB 8.9*  < > 7.5* 7.8*  HCT 26.8*  < > 24.6* 25.6*  MCV 86.0  < > 87.2 87.1  PLT 234  < > 175 188  < > = values in this interval not displayed.  Basic Metabolic Panel:   Recent Labs Lab 06/14/15 0215  06/16/15 0644 06/17/15 0420  NA 132*  < > 137 136  K 4.7  < > 4.8 4.3  CL 102  < > 104 104  CO2 19*  < > 23 23  GLUCOSE 291*  < > 176* 128*  BUN 14  < > 17 23*  CREATININE 1.64*  < > 1.41* 1.46*  CALCIUM 8.8*  < > 9.3 9.4  MG 1.7  --   --   --   PHOS 2.3*  --   --   --   < > = values in this interval not displayed.  Lipid Panel:     Component Value Date/Time   CHOL 206* 06/14/2015 0215   TRIG 64 06/15/2015 0550   HDL 50 06/14/2015 0215   CHOLHDL 4.1 06/14/2015 0215   VLDL 11 06/14/2015 0215   LDLCALC 145* 06/14/2015 0215   HgbA1c:  Lab Results  Component Value Date   HGBA1C 6.8* 06/14/2015   Urine Drug Screen: No results found for: LABOPIA, COCAINSCRNUR, LABBENZ, AMPHETMU, THCU, LABBARB    IMAGING I have personally reviewed the radiological images below and agree with the radiology interpretations.  Ct Head Wo Contrast 06/13/2015   Left posterior frontal intra cerebral hemorrhage with subarachnoid extension as described above.   Dg Chest Port 1 View 06/15/2015   Slightly better aeration. Endotracheal tube tip 3.8 cm above the carina.  06/14/2015  Stable support apparatus.  Persistent opacity left hilum and left perihilar region. Postradiation fibrosis in left upper lobe again noted. Left basilar atelectasis. No pulmonary edema. No pneumothorax. Status post median sternotomy.  06/13/2015  Endotracheal tube above the carina. Stable appearing postsurgical changes and persistent opacity in the left mid lung field.  06/13/2015   Stable to slightly increased left lung opacity which may represent posttreatment changes. No other significant change.  06/16/15 - Interval removal of the endotracheal and enteric tube. Stable appearing density in the left mid lung field with overall improvement of the aeration of the left lung.  2D Echocardiogram  - Procedure narrative: Transthoracic echocardiography. Image quality was poor. The study was technically difficult. - Left ventricle: The cavity size was normal. Systolic function was mildly reduced. The estimated ejection fraction was in the range of 45% to 50%. Wall motion was normal; there were no regional wall motion abnormalities. - Aortic valve: Valve area (Vmax): 2.82 cm^2. - Left atrium: The atrium was mildly dilated. - Right ventricle: The cavity size was mildly dilated. Wall thickness was normal. -  Right atrium: The atrium was mildly dilated. - Pericardium, extracardiac: Features were not consistent with tamponade physiology. Impressions:  There was no evidence of a vegetation.  MRI brain w and w/o contrast  06/15/2015   With the rounded contour of the enhancing component of the left frontal lobe hematoma, follow-up until clearance recommended to exclude underlying vascular abnormality. Posterior left frontal lobe complex 2.5 x 2.2 x 2 cm hemorrhagic lesion with marked surrounding vasogenic edema with breakthrough into sulci with subarachnoid hemorrhage noted. Patient's history of lung cancer in addition to 6 mm rounded area of enhanced along the posterior margin of this hemorrhage suggests that this is most likely is related to a  hemorrhagic metastatic lesion which has bled. Continued MR surveillance as hemorrhage clears is recommended. There may be a a second enhancing lesion within the anterior left parietal lobe with surrounding vasogenic edema. Venous infarct can have a similar appearance. The major dural sinuses appear patent. Opacification left sphenoid sinus with air-fluid level suggesting acute sinusitis. Bilateral mastoid air cell and middle ear opacification greater on the left without obstructing lesion of the eustachian tube noted.   MRA head 06/15/2015  MR angiogram does not incorporate the left frontal lobe hemorrhage. Mild narrowing supraclinoid aspect internal carotid artery bilaterally with irregularity more notable on the left. Small infundibulum on the left posterior communicating artery level. Anterior circulation without medium or large size vessel significant stenosis or occlusion. Middle cerebral artery branch vessel irregularity bilaterally. Right vertebral artery ends in a posterior inferior cerebellar artery distribution. No significant narrowing left vertebral artery. Moderate narrowing portions of the left posterior inferior cerebellar artery. Ectatic basilar artery without high-grade stenosis. Nonvisualized anterior inferior cerebellar arteries. Small left superior cerebellar artery. Posterior cerebral artery distal branch vessel narrowing bilaterally.   CUS Right: 1-39% ICA stenosis. Left: 1-39% ICA stenosis, however, higher velocities may be obscured by significant calcific plaque at the origin of the left ICA. Bilateral vertebral artery flow is antegrade.   LE venous doppler - negative for DVT  EEG - EEG Abnormalities: 1) mild generalized irregular slow activity Clinical Interpretation: This EEG is consistent with a mild generalized non-specific cerebral dysfunction(encephalopathy). This can be seen with sedative medications among other causes. There was no seizure or seizure predisposition recorded  on this study. Please note that a normal EEG does not preclude the possibility of epilepsy.    PHYSICAL EXAM General - Well nourished, well developed, not in acute distress  Ophthalmologic - Fundi not visualized due to continued coughing.  Cardiovascular - Regular rate and rhythm.  Neuro - awake alert, not in acute distress. Communicating well, follows commands, able to repeat and name with mild dysarthria. No visual field deficit, PERRL, EOMI, facial symmetrical, tongue in middle. Left UE 5/5 UE and 4/5 LE. RUE 0/5 and RLE 1/5 to pain. DTR diminished, and no babinski. Sensation symmetrical, coordination intact LUE and gait not tested.   ASSESSMENT/PLAN [name] is a 75 y.o. male with history of stage III lung cancer after chemo and radiation therapy, recent PNA and atrial fibrillation on eliquis presenting with R sided hemiplegia. CT showed a L frontal ICH with adjacent SAH.   ICH:  left frontal ICH with adjacent SAH while on Eliquis. 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   Reversed with Kcentra  Resultant right hemiplegia  MRI w/ & w/o  Posterior L frontal lobe hemorrhage w/ marked surrounding vasogenic edema with SAH, concern for  metastatic lesion posterior margin of hmg and anterior L parietal lobe.   Need to repeat MRI once blood all absorbed.  MRA  Atherosclerosis, no high grade stenosis  Carotid Doppler  No significant stenosis  2D Echo from 04/2015 EF 45-50%. No source of embolus, no vegetation  LE venous doppler negative for DVT  LDL 145  HgbA1c 7.4 in April  SCDs for VTE prophylaxis Diet heart healthy/carb modified Room service appropriate?: Yes; Fluid consistency:: Thin  Eliquis (apixaban) daily prior to admission, no antithrombotics after admission  Ongoing aggressive stroke risk factor management  Therapy recommendations:  CIR   Disposition:  pending insurance  Transient vs.  Paroxsymal Atrial Fibrillation  Home anticoagulation:  Eliquis (apixaban) daily   Stopped given hemorrhage  Found to have afib in the setting of respiratory failure  Following with Dr. Clayborn Bigness cardiology  Will defer to his outpt cardiologist Dr. Clayborn Bigness whether pt needs to be on eliquis after discharge.  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  Repeat CXR - improved.    Possible seizure with asystole  Treated with ativan and forphenytoin  on Keppra  EEG no seizure  Hyperlipidemia  Home meds:  No statin   LDL 145, goal < 70  Add lipitor '40mg'$   Continue lipitor on discharge  Diabetes  HgbA1c 7.4 in April, goal < 7.0  Uncontrolled  On SSI  Put back on home meds  CBG under control   Other Stroke Risk Factors  Advanced age  Former Cigarette smoker, quit 2000  Overweight, Body mass index is 28.25 kg/(m^2).   Coronary artery disease, s/p CABG 2000, MI  Other Active Problems  hypophosphatemia  Hypomagnesia  CKD stage III  Hospital day # 4   Rosalin Hawking, MD PhD Stroke Neurology 06/17/2015 9:37 AM    To contact Stroke Continuity provider, please refer to [email or web address] After hours, contact General Neurology

## 2015-06-18 ENCOUNTER — Inpatient Hospital Stay (HOSPITAL_COMMUNITY): Payer: Medicare HMO

## 2015-06-18 DIAGNOSIS — I610 Nontraumatic intracerebral hemorrhage in hemisphere, subcortical: Secondary | ICD-10-CM

## 2015-06-18 LAB — GLUCOSE, CAPILLARY
GLUCOSE-CAPILLARY: 144 mg/dL — AB (ref 65–99)
GLUCOSE-CAPILLARY: 115 mg/dL — AB (ref 65–99)
Glucose-Capillary: 90 mg/dL (ref 65–99)
Glucose-Capillary: 87 mg/dL (ref 65–99)
Glucose-Capillary: 85 mg/dL (ref 65–99)

## 2015-06-18 LAB — BASIC METABOLIC PANEL
ANION GAP: 11 (ref 5–15)
BUN: 31 mg/dL — ABNORMAL HIGH (ref 6–20)
CALCIUM: 9.4 mg/dL (ref 8.9–10.3)
CO2: 22 mmol/L (ref 22–32)
Chloride: 103 mmol/L (ref 101–111)
Creatinine, Ser: 1.59 mg/dL — ABNORMAL HIGH (ref 0.61–1.24)
GFR, EST AFRICAN AMERICAN: 47 mL/min — AB (ref >60)
GFR, EST NON AFRICAN AMERICAN: 41 mL/min — AB (ref >60)
Glucose, Bld: 120 mg/dL — ABNORMAL HIGH (ref 65–99)
Potassium: 4.2 mmol/L (ref 3.5–5.1)
SODIUM: 136 mmol/L (ref 135–145)

## 2015-06-18 LAB — CBC
HCT: 26.5 % — ABNORMAL LOW (ref 39.0–52.0)
Hemoglobin: 8.1 g/dL — ABNORMAL LOW (ref 13.0–17.0)
MCH: 26.8 pg (ref 26.0–34.0)
MCHC: 30.6 g/dL (ref 30.0–36.0)
MCV: 87.7 fL (ref 78.0–100.0)
PLATELETS: 160 10*3/uL (ref 150–400)
RBC: 3.02 MIL/uL — ABNORMAL LOW (ref 4.22–5.81)
RDW: 19.6 % — AB (ref 11.5–15.5)
WBC: 9.8 10*3/uL (ref 4.0–10.5)

## 2015-06-18 NOTE — Progress Notes (Signed)
Occupational Therapy Treatment Patient Details Name: Ivan Beasley MRN: [id number] DOB: October 09, 1940 Today's Date: 06/18/2015    History of present illness 75 y.o. male who presented on 6/4 with right-sided hemi-plegia. PMHx: Metastatic lung cancer, Recent pneumonia, CAD, Arithritis, Back pain, R TKA, GERD, DM, Impaired hearing, Macular degeneration, MI. CT on 6/4 + for left frontal intracerebral hemorrhage, with subarachnoid hemorrhage as well adjacent to it. MRI on 6/6 showed posterior L frontal lobe hemorrhage w/ marked surrounding vasogenic edema with SAH, concern for metastatic lesion posterior margin of hmg and anterior L parietal lobe.     OT comments  Pt with much flatter affect today and not interacting with therapist as much as previous sessions. Continues to demonstrate no active movement on RUE but pt is tolerating PROM exercise to RUE. Pt also tolerating LUE AROM. Encouraged continued ROM of bil UEs and reinforced proper positioning of RUE. Provided pt with red tubing to use for increased independence with self-feeding. Wife seems exhausted and frustrated with pts care while in hospital; relayed information to RN. D/c plan remains appropriate. Will continue to follow acutely.   Follow Up Recommendations  CIR;Supervision/Assistance - 24 hour    Equipment Recommendations  Other (comment) (TBD at next venue)    Recommendations for Other Services      Precautions / Restrictions Precautions Precautions: Fall Precaution Comments: R hemi, pt on home O2 PTA for recent pneumonia (but used it PRN, not 24/7) Restrictions Weight Bearing Restrictions: No       Mobility Bed Mobility               General bed mobility comments: Not assessed at this time  Transfers                 General transfer comment: Not assessed at this time.    Balance                                   ADL Overall ADL's : Needs assistance/impaired   Eating/Feeding Details  (indicate cue type and reason): Pts wife reports pt continues to have difficulty with self feeding. Provided pt with red tubing for built up handle on utensils.                                   General ADL Comments: Tolerated R UE PROM and L UE AROM. Reinforced positioning of R UE; wife verbalized understanding. Pt and wife frustrated with situation (not moving to CIR, care in hospital); relayed information to RN.      Vision                     Perception     Praxis      Cognition   Behavior During Therapy: Flat affect Overall Cognitive Status: Impaired/Different from baseline Area of Impairment: Following commands;Awareness;Safety/judgement        Following Commands: Follows one step commands with increased time Safety/Judgement: Decreased awareness of safety;Decreased awareness of deficits Awareness: Emergent   General Comments: Pt with flatter affect today than yesterday. Not as engaged in therapy and not interacting with therapist as much.    Extremity/Trunk Assessment               Exercises Other Exercises Other Exercises: PROM R shoulder flex/ext, elbow flex/ext, wrist flex/ext, digit flex/ext x 10 reps each.  Other Exercises: AROM L shoulder flex/ext, elbow flex/ext, digit flex/ext x10 reps each.   Shoulder Instructions       General Comments      Pertinent Vitals/ Pain       Pain Assessment: No/denies pain  Home Living                                          Prior Functioning/Environment              Frequency Min 3X/week     Progress Toward Goals  OT Goals(current goals can now be found in the care plan section)  Progress towards OT goals: Progressing toward goals  Acute Rehab OT Goals Patient Stated Goal: get to rehab OT Goal Formulation: With patient/family  Plan Discharge plan remains appropriate    Co-evaluation                 End of Session     Activity Tolerance Patient  limited by lethargy   Patient Left in bed;with call bell/phone within reach;with family/visitor present   Nurse Communication Other (comment);Need for lift equipment (pt and wife frustrated with care)        Time: 1630-1650 OT Time Calculation (min): 20 min  Charges: OT General Charges $OT Visit: 1 Procedure OT Treatments $Therapeutic Exercise: 8-22 mins  Binnie Kand M.S., OTR/L Pager: [phone]  06/18/2015, 5:04 PM

## 2015-06-18 NOTE — Progress Notes (Signed)
[name] thought patient is a little off today. Writer assessed patient and was able to answer all questions appropriately. Although blood pressure is 97/50. MD on the unit and was verbally notified. Will continue to monitor.

## 2015-06-18 NOTE — Progress Notes (Signed)
Inpatient Rehabilitation  Patient's insurance will not review case to determine authorization for IP Rehab until Monday.  If patient remains in house my co-worker will follow up.    Carmelia Roller., CCC/SLP Admission Coordinator  [place]  Cell [phone]

## 2015-06-18 NOTE — Progress Notes (Signed)
Inpatient Rehabilitation  Followed up with [facility] case manager this morning to discuss progress on insurance authorization for IP Rehab.  Per case manager, [place] decision will not likely be made until Monday.  I have notified patient, spouse, RN CM, and CSW that a back up plan may be warranted if patient is medically ready prior to insurance decision.  Plan to follow up again this afternoon with insurance.  I have a bed to offer patient if approval is received later today.  Will keep team updated.  Please call with questions.   Carmelia Roller., CCC/SLP Admission Coordinator  [place]  Cell [phone]

## 2015-06-18 NOTE — Progress Notes (Signed)
STROKE TEAM PROGRESS NOTE   SUBJECTIVE (INTERVAL HISTORY) [name] concerned. Patient with increased lethargy. BP low, Bun/Cr up from yesterday. Repeat chest x-ray was stable. We increased his IV fluids and we'll monitor his condition.   OBJECTIVE Temp:  [97.6 F (36.4 C)-99.1 F (37.3 C)] 99.1 F (37.3 C) (06/09 0945) Pulse Rate:  [86-107] 98 (06/09 1107) Cardiac Rhythm:  [-] Normal sinus rhythm (06/09 0712) Resp:  [18-20] 20 (06/09 0945) BP: (97-125)/(42-60) 97/50 mmHg (06/09 1107) SpO2:  [91 %-97 %] 91 % (06/09 0945)  CBC:   Recent Labs Lab 06/13/15 0934  06/17/15 0420 06/18/15 0544  WBC 5.9  < > 9.8 9.8  NEUTROABS 4.6  --   --   --   HGB 8.9*  < > 7.8* 8.1*  HCT 26.8*  < > 25.6* 26.5*  MCV 86.0  < > 87.1 87.7  PLT 234  < > 188 160  < > = values in this interval not displayed.  Basic Metabolic Panel:   Recent Labs Lab 06/14/15 0215  06/17/15 0420 06/18/15 0544  NA 132*  < > 136 136  K 4.7  < > 4.3 4.2  CL 102  < > 104 103  CO2 19*  < > 23 22  GLUCOSE 291*  < > 128* 120*  BUN 14  < > 23* 31*  CREATININE 1.64*  < > 1.46* 1.59*  CALCIUM 8.8*  < > 9.4 9.4  MG 1.7  --   --   --   PHOS 2.3*  --   --   --   < > = values in this interval not displayed.  Lipid Panel:     Component Value Date/Time   CHOL 206* 06/14/2015 0215   TRIG 64 06/15/2015 0550   HDL 50 06/14/2015 0215   CHOLHDL 4.1 06/14/2015 0215   VLDL 11 06/14/2015 0215   LDLCALC 145* 06/14/2015 0215   HgbA1c:  Lab Results  Component Value Date   HGBA1C 6.8* 06/14/2015   Urine Drug Screen: No results found for: LABOPIA, COCAINSCRNUR, LABBENZ, AMPHETMU, THCU, LABBARB    IMAGING Ct Head Wo Contrast 06/13/2015   Left posterior frontal intra cerebral hemorrhage with subarachnoid extension as described above.   Repeat CXR 6/9:  FINDINGS: Stable right jugular Port-A-Cath. Low volumes with bibasilar atelectasis. Left mid lung dense pulmonary opacity is stable.  No pneumothorax.  IMPRESSION: Stable left mid lung dense pulmonary opacity. Bibasilar atelectasis. Dg Chest Port 1 View 06/15/2015   Slightly better aeration. Endotracheal tube tip 3.8 cm above the carina.  06/14/2015  Stable support apparatus. Persistent opacity left hilum and left perihilar region. Postradiation fibrosis in left upper lobe again noted. Left basilar atelectasis. No pulmonary edema. No pneumothorax. Status post median sternotomy.  06/13/2015  Endotracheal tube above the carina. Stable appearing postsurgical changes and persistent opacity in the left mid lung field.  06/13/2015   Stable to slightly increased left lung opacity which may represent posttreatment changes. No other significant change.  06/16/15 - Interval removal of the endotracheal and enteric tube. Stable appearing density in the left mid lung field with overall improvement of the aeration of the left lung.  2D Echocardiogram  - Procedure narrative: Transthoracic echocardiography. Image quality was poor. The study was technically difficult. - Left ventricle: The cavity size was normal. Systolic function was mildly reduced. The estimated ejection fraction was in the range of 45% to 50%. Wall motion was normal; there were no regional wall motion abnormalities. - Aortic valve:  Valve area (Vmax): 2.82 cm^2. - Left atrium: The atrium was mildly dilated. - Right ventricle: The cavity size was mildly dilated. Wall thickness was normal. - Right atrium: The atrium was mildly dilated. - Pericardium, extracardiac: Features were not consistent with tamponade physiology. Impressions:  There was no evidence of a vegetation.  MRI brain w and w/o contrast  06/15/2015   With the rounded contour of the enhancing component of the left frontal lobe hematoma, follow-up until clearance recommended to exclude underlying vascular abnormality. Posterior left frontal lobe complex 2.5 x 2.2 x 2 cm hemorrhagic lesion with marked surrounding vasogenic  edema with breakthrough into sulci with subarachnoid hemorrhage noted. Patient's history of lung cancer in addition to 6 mm rounded area of enhanced along the posterior margin of this hemorrhage suggests that this is most likely is related to a hemorrhagic metastatic lesion which has bled. Continued MR surveillance as hemorrhage clears is recommended. There may be a a second enhancing lesion within the anterior left parietal lobe with surrounding vasogenic edema. Venous infarct can have a similar appearance. The major dural sinuses appear patent. Opacification left sphenoid sinus with air-fluid level suggesting acute sinusitis. Bilateral mastoid air cell and middle ear opacification greater on the left without obstructing lesion of the eustachian tube noted.   MRA head 06/15/2015  MR angiogram does not incorporate the left frontal lobe hemorrhage. Mild narrowing supraclinoid aspect internal carotid artery bilaterally with irregularity more notable on the left. Small infundibulum on the left posterior communicating artery level. Anterior circulation without medium or large size vessel significant stenosis or occlusion. Middle cerebral artery branch vessel irregularity bilaterally. Right vertebral artery ends in a posterior inferior cerebellar artery distribution. No significant narrowing left vertebral artery. Moderate narrowing portions of the left posterior inferior cerebellar artery. Ectatic basilar artery without high-grade stenosis. Nonvisualized anterior inferior cerebellar arteries. Small left superior cerebellar artery. Posterior cerebral artery distal branch vessel narrowing bilaterally.   CUS Right: 1-39% ICA stenosis. Left: 1-39% ICA stenosis, however, higher velocities may be obscured by significant calcific plaque at the origin of the left ICA. Bilateral vertebral artery flow is antegrade.   LE venous doppler - negative for DVT  EEG - EEG Abnormalities: 1) mild generalized irregular slow  activity Clinical Interpretation: This EEG is consistent with a mild generalized non-specific cerebral dysfunction(encephalopathy). This can be seen with sedative medications among other causes. There was no seizure or seizure predisposition recorded on this study. Please note that a normal EEG does not preclude the possibility of epilepsy.    PHYSICAL EXAM: No changes, exam is stable today General - Well nourished, well developed, not in acute distress  Ophthalmologic - Fundi not visualized due to continued coughing.  Cardiovascular - Regular rate and rhythm.  Neuro - awake alert, not in acute distress. Communicating well, follows commands, able to repeat and name with mild dysarthria. No visual field deficit, PERRL, EOMI, facial symmetrical, tongue in middle. Left UE 5/5 UE and 4/5 LE. RUE 0/5 and RLE 1/5 to pain. DTR diminished, and no babinski. Sensation symmetrical, coordination intact LUE and gait not tested.   ASSESSMENT/PLAN Mr. LONZELL DORRIS is a 75 y.o. male with history of stage III lung cancer after chemo and radiation therapy, recent PNA and atrial fibrillation on eliquis presenting with R sided hemiplegia. CT showed a L frontal ICH with adjacent SAH.   ICH:  left frontal ICH with adjacent SAH while on Eliquis. MRI suggested hemorrhage secondary to lung cancer metastasis to brain in the  setting of eliquis use.   Reversed with Kcentra  Resultant right hemiplegia  MRI w/ & w/o  Posterior L frontal lobe hemorrhage w/ marked surrounding vasogenic edema with SAH, concern for metastatic lesion posterior margin of hmg and anterior L parietal lobe.   Need to repeat MRI once blood all absorbed.  MRA  Atherosclerosis, no high grade stenosis  Carotid Doppler  No significant stenosis  2D Echo from 04/2015 EF 45-50%. No source of embolus, no vegetation  LE venous doppler negative for DVT  LDL 145  HgbA1c 7.4 in April  SCDs for VTE prophylaxis Diet heart healthy/carb  modified Room service appropriate?: Yes; Fluid consistency:: Thin  Eliquis (apixaban) daily prior to admission, no antithrombotics after admission  Ongoing aggressive stroke risk factor management  Therapy recommendations:  CIR   Disposition:  pending insurance approval  Transient vs. Paroxsymal Atrial Fibrillation  Home anticoagulation:  Eliquis (apixaban) daily   Stopped given hemorrhage  Found to have afib in the setting of respiratory failure  Following with Dr. Clayborn Bigness cardiology  Will defer to his outpt cardiologist Dr. Clayborn Bigness whether pt needs to be on eliquis after discharge.  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  Given cough today, will repeat CXR   Possible seizure with asystole  Treated with ativan and forphenytoin  on Keppra  EEG no seizure  Hyperlipidemia  Home meds:  No statin   LDL 145, goal < 70  Add lipitor '40mg'$   Continue lipitor on discharge  Diabetes  HgbA1c 7.4 in April, goal < 7.0  Uncontrolled  On SSI  Put back on home meds  CBG under control   Other Stroke Risk Factors  Advanced age  Former Cigarette smoker, quit 2000  Overweight, Body mass index is 28.25 kg/(m^2).   Coronary artery disease, s/p CABG 2000, MI  Other Active Problems  hypophosphatemia  Hypomagnesia  CKD stage III  Worsening renal function in setting of hypotension. IVF increased to 75/h.   Hospital day # 5   Personally examined patient and images, and have  participated in and made any corrections needed to history, physical, neuro exam,assessment and plan as stated above.  I have personally obtained the history, evaluated lab date, reviewed imaging studies and agree with radiology interpretations.    Sarina Ill, MD Stroke Neurology [phone] [facility]     To contact Stroke Continuity provider, please refer to [email or web address] After hours, contact General Neurology

## 2015-06-18 NOTE — Clinical Social Work Note (Signed)
Clinical Social Worker has met with patient and spouse at length in reference to post-acute placement for SNF. Follow psychosocial assessment to follow.  CSW remains available as needed.   Glendon Axe, MSW, LCSWA [phone] 06/18/2015 4:41 PM

## 2015-06-18 NOTE — Care Management Note (Signed)
Case Management Note  Patient Details  Name: Ivan Beasley MRN: [id number] Date of Birth: 1940-08-05  Subjective/Objective:                    Action/Plan: Plan is CIR vs SNF. CM following for d/c needs.   Expected Discharge Date:                  Expected Discharge Plan:  [place]  In-House Referral:     Discharge planning Services  CM Consult  Post Acute Care Choice:    Choice offered to:     DME Arranged:    DME Agency:     HH Arranged:    [place] Agency:     Status of Service:  In process, will continue to follow  Medicare Important Message Given:  Yes Date Medicare IM Given:    Medicare IM give by:    Date Additional Medicare IM Given:    Additional Medicare Important Message give by:     If discussed at [place] of Stay Meetings, dates discussed:    Additional Comments:  Pollie Friar, RN 06/18/2015, 2:31 PM

## 2015-06-19 DIAGNOSIS — C3490 Malignant neoplasm of unspecified part of unspecified bronchus or lung: Secondary | ICD-10-CM | POA: Diagnosis present

## 2015-06-19 DIAGNOSIS — I950 Idiopathic hypotension: Secondary | ICD-10-CM | POA: Diagnosis present

## 2015-06-19 LAB — CBC WITH DIFFERENTIAL/PLATELET
BASOS ABS: 0 10*3/uL (ref 0.0–0.1)
BASOS PCT: 0 %
EOS ABS: 0.1 10*3/uL (ref 0.0–0.7)
Eosinophils Relative: 2 %
HEMATOCRIT: 26 % — AB (ref 39.0–52.0)
HEMOGLOBIN: 8 g/dL — AB (ref 13.0–17.0)
Lymphocytes Relative: 11 %
Lymphs Abs: 0.8 10*3/uL (ref 0.7–4.0)
MCH: 26.9 pg (ref 26.0–34.0)
MCHC: 30.8 g/dL (ref 30.0–36.0)
MCV: 87.5 fL (ref 78.0–100.0)
Monocytes Absolute: 0.5 10*3/uL (ref 0.1–1.0)
Monocytes Relative: 7 %
NEUTROS ABS: 5.3 10*3/uL (ref 1.7–7.7)
NEUTROS PCT: 80 %
Platelets: 151 10*3/uL (ref 150–400)
RBC: 2.97 MIL/uL — AB (ref 4.22–5.81)
RDW: 19.4 % — ABNORMAL HIGH (ref 11.5–15.5)
WBC: 6.6 10*3/uL (ref 4.0–10.5)

## 2015-06-19 LAB — COMPREHENSIVE METABOLIC PANEL
ALBUMIN: 2.8 g/dL — AB (ref 3.5–5.0)
ALK PHOS: 125 U/L (ref 38–126)
ALT: 29 U/L (ref 17–63)
ANION GAP: 10 (ref 5–15)
AST: 38 U/L (ref 15–41)
BUN: 28 mg/dL — AB (ref 6–20)
CALCIUM: 8.9 mg/dL (ref 8.9–10.3)
CO2: 19 mmol/L — AB (ref 22–32)
Chloride: 104 mmol/L (ref 101–111)
Creatinine, Ser: 1.47 mg/dL — ABNORMAL HIGH (ref 0.61–1.24)
GFR calc Af Amer: 52 mL/min — ABNORMAL LOW (ref >60)
GFR calc non Af Amer: 45 mL/min — ABNORMAL LOW (ref >60)
GLUCOSE: 105 mg/dL — AB (ref 65–99)
Potassium: 3.9 mmol/L (ref 3.5–5.1)
SODIUM: 133 mmol/L — AB (ref 135–145)
Total Bilirubin: 0.3 mg/dL (ref 0.3–1.2)
Total Protein: 6.1 g/dL — ABNORMAL LOW (ref 6.5–8.1)

## 2015-06-19 LAB — CULTURE, BLOOD (ROUTINE X 2)
CULTURE: NO GROWTH
CULTURE: NO GROWTH

## 2015-06-19 LAB — GLUCOSE, CAPILLARY
GLUCOSE-CAPILLARY: 119 mg/dL — AB (ref 65–99)
GLUCOSE-CAPILLARY: 139 mg/dL — AB (ref 65–99)
GLUCOSE-CAPILLARY: 98 mg/dL (ref 65–99)
Glucose-Capillary: 107 mg/dL — ABNORMAL HIGH (ref 65–99)
Glucose-Capillary: 111 mg/dL — ABNORMAL HIGH (ref 65–99)
Glucose-Capillary: 129 mg/dL — ABNORMAL HIGH (ref 65–99)

## 2015-06-19 NOTE — Clinical Social Work Placement (Signed)
   CLINICAL SOCIAL WORK PLACEMENT  NOTE  Date:  06/19/2015  Patient Details  Name: Ivan Beasley MRN: [id number] Date of Birth: 1940/11/03  Clinical Social Work is seeking post-discharge placement for this patient at the [place] level of care (*CSW will initial, date and re-position this form in  chart as items are completed):  Yes   Patient/family provided with [place] Work Department's list of facilities offering this level of care within the geographic area requested by the patient (or if unable, by the patient's family).  Yes   Patient/family informed of their freedom to choose among providers that offer the needed level of care, that participate in Medicare, Medicaid or managed care program needed by the patient, have an available bed and are willing to accept the patient.  Yes   Patient/family informed of [facility]'s ownership interest in [facility] and [facility], as well as of the fact that they are under no obligation to receive care at these facilities.  PASRR submitted to EDS on 06/19/15     PASRR number received on 06/19/15     Existing PASRR number confirmed on       FL2 transmitted to all facilities in geographic area requested by pt/family on 06/19/15     FL2 transmitted to all facilities within larger geographic area on       Patient informed that his/her managed care company has contracts with or will negotiate with certain facilities, including the following:            Patient/family informed of bed offers received.  Patient chooses bed at       Physician recommends and patient chooses bed at      Patient to be transferred to   on  .  Patient to be transferred to facility by       Patient family notified on   of transfer.  Name of family member notified:        PHYSICIAN Please prepare priority discharge summary, including medications, Please prepare prescriptions, Please sign FL2     Additional  Comment:    _______________________________________________ Rigoberto Noel, LCSW 06/19/2015, 1:24 PM

## 2015-06-19 NOTE — NC FL2 (Signed)
[place] LEVEL OF CARE SCREENING TOOL     IDENTIFICATION  Patient Name: Ivan Beasley Birthdate: 07-Jun-1940 Sex: male Admission Date (Current Location): 06/13/2015  [facility] and [place] Number:  Engineering geologist and Address:  [facility] [place]. [facility], [place] [address] [place], [place] [address]      Provider Number: [id number]  Attending Physician Name and Address:  Rosalin Hawking, MD  Relative Name and Phone Number:       Current Level of Care: Hospital Recommended Level of Care: [place] Prior Approval Number:    Date Approved/Denied:   PASRR Number: [id number]  Discharge Plan: SNF    Current Diagnoses: Patient Active Problem List   Diagnosis Date Noted  . ICH (intracerebral hemorrhage) ([place]) 06/13/2015  . Paroxysmal atrial fibrillation ([place]) 05/13/2015  . PNA (pneumonia) 05/13/2015  . HCAP (healthcare-associated pneumonia)   . Pneumonitis   . Acute respiratory failure ([place])   . Acute on chronic respiratory failure with hypoxia ([place]) 04/17/2015  . Respiratory failure ([place]) 04/17/2015  . Cancer of lung ([place]) 05/17/2014  . 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 Cough, persistent 08/29/2013  . Mild chronic obstructive pulmonary disease ([place]) 08/29/2013  . H/O cardiac catheterization 07/29/2013  . Breath shortness 06/23/2013  . Chest pain 06/23/2013  . BP (high blood pressure) 06/20/2013  . Diabetes ([place]) 06/20/2013  . HLD (hyperlipidemia) 06/20/2013  . H/O coronary artery bypass surgery 06/20/2013  . Diabetes mellitus ([place]) 06/20/2013  . H/O total knee replacement 06/03/2013    Orientation RESPIRATION BLADDER Height & Weight     Self, Time, Place  Normal Continent Weight: 94.5 kg (208 lb 5.4 oz) Height:  6' (182.9 cm)  BEHAVIORAL SYMPTOMS/MOOD NEUROLOGICAL BOWEL NUTRITION STATUS   (NONE)  (NONE) Continent Diet (Heart Healthy/Carb Modified)  AMBULATORY STATUS COMMUNICATION OF NEEDS Skin   Extensive Assist Verbally  Normal                       Personal Care Assistance Level of Assistance  Bathing, Feeding, Dressing Bathing Assistance: Limited assistance Feeding assistance: Independent Dressing Assistance: Limited assistance     Functional Limitations Info  Sight, Hearing, Speech Sight Info: Adequate Hearing Info: Impaired Speech Info: Adequate    SPECIAL CARE FACTORS FREQUENCY  PT (By licensed PT), OT (By licensed OT)     PT Frequency: 5/week OT Frequency: 5/week            Contractures Contractures Info: Not present    Additional Factors Info  Insulin Sliding Scale, Code Status, Allergies Code Status Info: Full Allergies Info: NKDA   Insulin Sliding Scale Info: 6/day       Current Medications (06/19/2015):  This is the current hospital active medication list Current Facility-Administered Medications  Medication Dose Route Frequency Provider Last Rate Last Dose  . 0.9 %  sodium chloride infusion   Intravenous Continuous Donzetta Starch, NP 75 mL/hr at 06/19/15 1202    . acetaminophen (TYLENOL) tablet 650 mg  650 mg Oral Q4H PRN Ram Fuller Mandril, MD   650 mg at 06/18/15 1335   Or  . acetaminophen (TYLENOL) suppository 650 mg  650 mg Rectal Q4H PRN Ram Fuller Mandril, MD      . albuterol (PROVENTIL) (2.5 MG/3ML) 0.083% nebulizer solution 2.5 mg  2.5 mg Nebulization Q6H PRN Rosalin Hawking, MD      . ALPRAZolam Duanne Moron) tablet 0.25 mg  0.25 mg Oral TID PRN Rosalin Hawking, MD  0.25 mg at 06/17/15 2024  . amiodarone (PACERONE) tablet 200 mg  200 mg Oral Daily Ram Fuller Mandril, MD   200 mg at 06/19/15 3428  . antiseptic oral rinse (CPC / CETYLPYRIDINIUM CHLORIDE 0.05%) solution 7 mL  7 mL Mouth Rinse BID Rosalin Hawking, MD   7 mL at 06/19/15 0943  . atorvastatin (LIPITOR) tablet 40 mg  40 mg Oral q1800 Rosalin Hawking, MD   40 mg at 06/18/15 1715  . chlorpheniramine-HYDROcodone (TUSSIONEX) 10-8 MG/5ML suspension 5 mL  5 mL Oral Q12H PRN Grace Bushy Minor, NP   5 mL at  06/17/15 2313  . diltiazem (CARDIZEM) tablet 60 mg  60 mg Oral BID Rosalin Hawking, MD   60 mg at 06/19/15 0942  . glimepiride (AMARYL) tablet 1 mg  1 mg Oral QAC breakfast Rosalin Hawking, MD   1 mg at 06/19/15 7681  . insulin aspart (novoLOG) injection 0-24 Units  0-24 Units Subcutaneous Q4H Ram Fuller Mandril, MD   2 Units at 06/19/15 1200  . labetalol (NORMODYNE,TRANDATE) injection 10-40 mg  10-40 mg Intravenous Q10 min PRN Ram Fuller Mandril, MD      . lactose free nutrition (BOOST PLUS) liquid 237 mL  237 mL Oral TID WC Collene Gobble, MD   237 mL at 06/19/15 1100  . levETIRAcetam (KEPPRA) tablet 1,500 mg  1,500 mg Oral BID Rosalin Hawking, MD   1,500 mg at 06/19/15 0942  . metFORMIN (GLUCOPHAGE) tablet 1,000 mg  1,000 mg Oral Q breakfast Rosalin Hawking, MD   1,000 mg at 06/19/15 1572  . metFORMIN (GLUCOPHAGE) tablet 500 mg  500 mg Oral Q supper Rosalin Hawking, MD   500 mg at 06/18/15 1715  . pantoprazole (PROTONIX) EC tablet 40 mg  40 mg Oral Daily Rosalin Hawking, MD   40 mg at 06/19/15 0943  . senna-docusate (Senokot-S) tablet 1 tablet  1 tablet Oral BID Ram Fuller Mandril, MD   1 tablet at 06/19/15 6203   Facility-Administered Medications Ordered in Other Encounters  Medication Dose Route Frequency Provider Last Rate Last Dose  . 0.9 %  sodium chloride infusion   Intravenous Continuous Forest Gleason, MD   Stopped at 12/11/14 1034  . lidocaine-prilocaine (EMLA) cream   Topical Once Evlyn Kanner, NP      . sodium chloride 0.9 % injection 10 mL  10 mL Intracatheter PRN Forest Gleason, MD   10 mL at 06/03/14 1106  . sodium chloride 0.9 % injection 10 mL  10 mL Intravenous PRN Forest Gleason, MD   10 mL at 07/30/14 0920  . sodium chloride 0.9 % injection 10 mL  10 mL Intracatheter PRN Forest Gleason, MD   10 mL at 12/11/14 0912  . sodium chloride flush (NS) 0.9 % injection 10 mL  10 mL Intravenous PRN Forest Gleason, MD   10 mL at 05/05/15 0848     Discharge Medications: Please see discharge  summary for a list of discharge medications.  Relevant Imaging Results:  Relevant Lab Results:   Additional Information SSN: [id number]  Rigoberto Noel, LCSW

## 2015-06-19 NOTE — Progress Notes (Signed)
STROKE TEAM PROGRESS NOTE   SUBJECTIVE (INTERVAL HISTORY) [name] at bedside.  She is pleased to see improvement in mentation.  No events overnight.  BP improving.  ROS is completely negative  OBJECTIVE Temp:  [98.2 F (36.8 C)-98.4 F (36.9 C)] 98.3 F (36.8 C) (06/10 1400) Pulse Rate:  [82-95] 85 (06/10 1400) Cardiac Rhythm:  [-] Normal sinus rhythm;Heart block (06/10 0700) Resp:  [17-20] 17 (06/10 1400) BP: (106-124)/(52-64) 118/64 mmHg (06/10 1400) SpO2:  [92 %-96 %] 95 % (06/10 1400)  CBC:   Recent Labs Lab 06/13/15 0934  06/18/15 0544 06/19/15 1342  WBC 5.9  < > 9.8 6.6  NEUTROABS 4.6  --   --  5.3  HGB 8.9*  < > 8.1* 8.0*  HCT 26.8*  < > 26.5* 26.0*  MCV 86.0  < > 87.7 87.5  PLT 234  < > 160 151  < > = values in this interval not displayed.  Basic Metabolic Panel:   Recent Labs Lab 06/14/15 0215  06/18/15 0544 06/19/15 1342  NA 132*  < > 136 133*  K 4.7  < > 4.2 3.9  CL 102  < > 103 104  CO2 19*  < > 22 19*  GLUCOSE 291*  < > 120* 105*  BUN 14  < > 31* 28*  CREATININE 1.64*  < > 1.59* 1.47*  CALCIUM 8.8*  < > 9.4 8.9  MG 1.7  --   --   --   PHOS 2.3*  --   --   --   < > = values in this interval not displayed.  Lipid Panel:     Component Value Date/Time   CHOL 206* 06/14/2015 0215   TRIG 64 06/15/2015 0550   HDL 50 06/14/2015 0215   CHOLHDL 4.1 06/14/2015 0215   VLDL 11 06/14/2015 0215   LDLCALC 145* 06/14/2015 0215   HgbA1c:  Lab Results  Component Value Date   HGBA1C 6.8* 06/14/2015   Urine Drug Screen: No results found for: LABOPIA, COCAINSCRNUR, LABBENZ, AMPHETMU, THCU, LABBARB    IMAGING  Ct Head Wo Contrast 06/13/2015    Left posterior frontal intra cerebral hemorrhage with subarachnoid extension as described above.    Repeat CXR 6/9: FINDINGS: Stable right jugular Port-A-Cath. Low volumes with bibasilar atelectasis. Left mid lung dense pulmonary opacity is stable. No pneumothorax. IMPRESSION: Stable left mid lung  dense pulmonary opacity. Bibasilar atelectasis.  Dg Chest Port 1 View 06/15/2015   Slightly better aeration. Endotracheal tube tip 3.8 cm above the carina.  06/14/2015  Stable support apparatus. Persistent opacity left hilum and left perihilar region. Postradiation fibrosis in left upper lobe again noted. Left basilar atelectasis. No pulmonary edema. No pneumothorax. Status post median sternotomy.  06/13/2015  Endotracheal tube above the carina. Stable appearing postsurgical changes and persistent opacity in the left mid lung field.  06/13/2015   Stable to slightly increased left lung opacity which may represent posttreatment changes. No other significant change.  06/16/15 - Interval removal of the endotracheal and enteric tube. Stable appearing density in the left mid lung field with overall improvement of the aeration of the left lung.  2D Echocardiogram  - Procedure narrative: Transthoracic echocardiography. Image quality was poor. The study was technically difficult. - Left ventricle: The cavity size was normal. Systolic function was mildly reduced. The estimated ejection fraction was in the range of 45% to 50%. Wall motion was normal; there were no regional wall motion abnormalities. - Aortic valve: Valve area (Vmax):  2.82 cm^2. - Left atrium: The atrium was mildly dilated. - Right ventricle: The cavity size was mildly dilated. Wall thickness was normal. - Right atrium: The atrium was mildly dilated. - Pericardium, extracardiac: Features were not consistent with tamponade physiology. Impressions:  There was no evidence of a vegetation.  MRI brain w and w/o contrast  06/15/2015   With the rounded contour of the enhancing component of the left frontal lobe hematoma, follow-up until clearance recommended to exclude underlying vascular abnormality. Posterior left frontal lobe complex 2.5 x 2.2 x 2 cm hemorrhagic lesion with marked surrounding vasogenic edema with breakthrough into sulci with subarachnoid  hemorrhage noted. Patient's history of lung cancer in addition to 6 mm rounded area of enhanced along the posterior margin of this hemorrhage suggests that this is most likely is related to a hemorrhagic metastatic lesion which has bled. Continued MR surveillance as hemorrhage clears is recommended. There may be a a second enhancing lesion within the anterior left parietal lobe with surrounding vasogenic edema. Venous infarct can have a similar appearance. The major dural sinuses appear patent. Opacification left sphenoid sinus with air-fluid level suggesting acute sinusitis. Bilateral mastoid air cell and middle ear opacification greater on the left without obstructing lesion of the eustachian tube noted.   MRA head 06/15/2015  MR angiogram does not incorporate the left frontal lobe hemorrhage. Mild narrowing supraclinoid aspect internal carotid artery bilaterally with irregularity more notable on the left. Small infundibulum on the left posterior communicating artery level. Anterior circulation without medium or large size vessel significant stenosis or occlusion. Middle cerebral artery branch vessel irregularity bilaterally. Right vertebral artery ends in a posterior inferior cerebellar artery distribution. No significant narrowing left vertebral artery. Moderate narrowing portions of the left posterior inferior cerebellar artery. Ectatic basilar artery without high-grade stenosis. Nonvisualized anterior inferior cerebellar arteries. Small left superior cerebellar artery. Posterior cerebral artery distal branch vessel narrowing bilaterally.   CUS Right: 1-39% ICA stenosis. Left: 1-39% ICA stenosis, however, higher velocities may be obscured by significant calcific plaque at the origin of the left ICA. Bilateral vertebral artery flow is antegrade.   LE venous doppler - negative for DVT  EEG - EEG Abnormalities: 1) mild generalized irregular slow activity Clinical Interpretation: This EEG is consistent  with a mild generalized non-specific cerebral dysfunction(encephalopathy). This can be seen with sedative medications among other causes. There was no seizure or seizure predisposition recorded on this study. Please note that a normal EEG does not preclude the possibility of epilepsy.    PHYSICAL EXAM: No changes, exam is stable today General - Well nourished, well developed, not in acute distress Cardiovascular - Regular rate and rhythm. Pulm:  CTA Abd:  Benign Extrem:  No C/C/E  Neuro - awake alert, not in acute distress. Communicating well, follows commands, able to repeat and name with mild dysarthria. No visual field deficit, PERRL, EOMI, facial symmetrical, tongue in middle. Left UE 5/5 UE and 4/5 LE. RUE 0/5 and RLE 1/5 to pain. DTR diminished, and no babinski. Sensation symmetrical, coordination intact LUE and gait not tested.   ASSESSMENT/PLAN Ivan Beasley is a 75 y.o. male with history of stage III lung cancer after chemo and radiation therapy, recent PNA and atrial fibrillation on eliquis presenting with R sided hemiplegia. CT showed a L frontal ICH with adjacent SAH.   ICH:  left frontal ICH with adjacent SAH while on Eliquis. 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  Reversed with Kcentra  Resultant right hemiplegia  MRI w/ & w/o  Posterior L frontal lobe hemorrhage w/ marked surrounding vasogenic edema with SAH, concern for metastatic lesion posterior margin of hmg and anterior L parietal lobe.   Need to repeat MRI once blood all absorbed.  MRA  Atherosclerosis, no high grade stenosis  Carotid Doppler  No significant stenosis  2D Echo from 04/2015 EF 45-50%. No source of embolus, no vegetation  LE venous doppler negative for DVT  LDL 145  HgbA1c 7.4 in April  SCDs for VTE prophylaxis Diet heart healthy/carb modified Room service appropriate?: Yes; Fluid consistency:: Thin  Eliquis (apixaban)  daily prior to admission, no antithrombotics after admission  Ongoing aggressive stroke risk factor management  Therapy recommendations:  CIR   Disposition:  pending insurance approval  Transient vs. Paroxsymal Atrial Fibrillation  Home anticoagulation:  Eliquis (apixaban) daily   Stopped given hemorrhage  Found to have afib in the setting of respiratory failure  Following with Dr. Clayborn Bigness cardiology  Will defer to his outpt cardiologist Dr. Clayborn Bigness whether pt needs to be on eliquis after discharge.  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  Given cough today, will repeat CXR   Possible seizure with asystole  Treated with ativan and forphenytoin  on Keppra  EEG no seizure  Hyperlipidemia  Home meds:  No statin   LDL 145, goal < 70  Add lipitor '40mg'$   Continue lipitor on discharge  Diabetes  HgbA1c 7.4 in April, goal < 7.0  Uncontrolled  On SSI  Put back on home meds  CBG under control   Other Stroke Risk Factors  Advanced age  Former Cigarette smoker, quit 2000  Overweight, Body mass index is 28.25 kg/(m^2).   Coronary artery disease, s/p CABG 2000, MI  Other Active Problems  hypophosphatemia  Hypomagnesia  CKD stage III  Anemia - Hb 8.9; 8.1; 8.  Renal Function - Recheck Sunday  Worsening renal function in setting of hypotension. IVF increased to 75/h.   Hospital day # [facility] Pager [phone] 06/19/2015, 3:43  PM  ATTENDING NOTE: Patient was seen and examined by me personally. Documentation reflects findings. The laboratory and radiographic studies reviewed by me. ROS completed by me personally and pertinent positives fully documented   Condition:stable  Assessment and plan completed by me personally and fully documented above. Plans/Recommendations include:     If medically stable, D/C fluids tomorrow and observe off of fluids for return of low BP  Monitor CBC/ labs   SIGNED BY: Dr. Elissa Hefty       To contact Stroke Continuity provider, please refer to [email or web address] After hours, contact General Neurology

## 2015-06-20 DIAGNOSIS — R059 Cough, unspecified: Secondary | ICD-10-CM | POA: Diagnosis present

## 2015-06-20 DIAGNOSIS — R05 Cough: Secondary | ICD-10-CM

## 2015-06-20 LAB — CBC
HCT: 25.8 % — ABNORMAL LOW (ref 39.0–52.0)
HEMOGLOBIN: 8 g/dL — AB (ref 13.0–17.0)
MCH: 26.8 pg (ref 26.0–34.0)
MCHC: 31 g/dL (ref 30.0–36.0)
MCV: 86.6 fL (ref 78.0–100.0)
Platelets: 155 10*3/uL (ref 150–400)
RBC: 2.98 MIL/uL — AB (ref 4.22–5.81)
RDW: 19.2 % — ABNORMAL HIGH (ref 11.5–15.5)
WBC: 6.6 10*3/uL (ref 4.0–10.5)

## 2015-06-20 LAB — GLUCOSE, CAPILLARY
GLUCOSE-CAPILLARY: 109 mg/dL — AB (ref 65–99)
GLUCOSE-CAPILLARY: 125 mg/dL — AB (ref 65–99)
GLUCOSE-CAPILLARY: 94 mg/dL (ref 65–99)
Glucose-Capillary: 104 mg/dL — ABNORMAL HIGH (ref 65–99)
Glucose-Capillary: 149 mg/dL — ABNORMAL HIGH (ref 65–99)
Glucose-Capillary: 136 mg/dL — ABNORMAL HIGH (ref 65–99)

## 2015-06-20 LAB — BASIC METABOLIC PANEL
Anion gap: 11 (ref 5–15)
BUN: 29 mg/dL — ABNORMAL HIGH (ref 6–20)
CHLORIDE: 103 mmol/L (ref 101–111)
CO2: 19 mmol/L — ABNORMAL LOW (ref 22–32)
Calcium: 9.5 mg/dL (ref 8.9–10.3)
Creatinine, Ser: 1.44 mg/dL — ABNORMAL HIGH (ref 0.61–1.24)
GFR calc non Af Amer: 46 mL/min — ABNORMAL LOW (ref >60)
GFR, EST AFRICAN AMERICAN: 53 mL/min — AB (ref >60)
Glucose, Bld: 159 mg/dL — ABNORMAL HIGH (ref 65–99)
POTASSIUM: 4.2 mmol/L (ref 3.5–5.1)
SODIUM: 133 mmol/L — AB (ref 135–145)

## 2015-06-20 MED ORDER — LEVETIRACETAM 500 MG PO TABS
1000.0000 mg | ORAL_TABLET | Freq: Two times a day (BID) | ORAL | Status: DC
  Administered 2015-06-20 – 2015-06-21 (×2): 1000 mg via ORAL
  Filled 2015-06-20 (×2): qty 2

## 2015-06-20 NOTE — Clinical Social Work Note (Signed)
Clinical Social Work Assessment  Patient Details  Name: Ivan Beasley MRN: [id number] Date of Birth: 1940/12/15  Date of referral:  06/20/15               Reason for consult:  Facility Placement                Permission sought to share information with:  Family Supports Permission granted to share information::  Yes, Verbal Permission Granted  Name::      [place])  Agency::   (SNF's )  Relationship::   (Spouse )  Contact Information:   [phone])  Housing/Transportation Living arrangements for the past 2 months:  Single Family Home Source of Information:  Spouse Patient Interpreter Needed:  None Criminal Activity/Legal Involvement Pertinent to Current Situation/Hospitalization:  No - Comment as needed Significant Relationships:  Spouse Lives with:  Spouse Do you feel safe going back to the place where you live?  Yes Need for family participation in patient care:  Yes (Comment)  Care giving concerns:  CIR vs SNF    Social Worker assessment / plan:  Holiday representative met with patient and pt's wife present at bedside in reference to post-acute placement for SNF. CSW introduced CSW role and SNF process. CSW also reviewed and provided SNF list. [name] expressed that she is hopeful for CIR however understands that insurance company is still reviewing information. [name] reported that in the event that pt is unable to be admitted into IP REHAB she would like for patient to go to a facility in the [place] area. CSW explained to pt's wife that patient has [facility] HMO (non-silverback) plan which usually takes a SNF about 2 to receive insurance auth. CSW further explained that if patient or spouse is not able to pay privately at SNF until insurance Josem Kaufmann is obtained that patient would need likely need to discharge to a facility that will accept Letter of Guarantee (LOG) for pending insurance authorization. LOG process also explained. [name] expressed  understanding and is aware that there are not any facilities in the [place] area that usual accepts LOG's. [name] understands this process however strongly expressed her desire for patient to be placed in [place] area if patient is not able to d/c to CIR.    [name] thoroughly involved in pt's care and is usually at bedside. Pt's spouse agreeable to SNF search in both [place] and [facility] and prefers [facility]. No further concerns reported at this time. CSW will continue to follow pt and pt's family for continued support and to facilitate pt's d/c once medically stable for d/c.   Possible admit to CIR on Monday.   Employment status:  Retired Nurse, adult PT Recommendations:  Inpatient [place], [place] / Referral to community resources:  [place]  Patient/Family's Response to care:  Pt asleep during assessment. Pt's spouse agreeable to SNF placement if CIR does not receive approval. Pt's spouse supportive and involved in care. [name] pleasant and appreciated social work intervention.   Patient/Family's Understanding of and Emotional Response to Diagnosis, Current Treatment, and Prognosis:  [name] understanding of medical interventions.   Emotional Assessment Appearance:  Appears stated age Attitude/Demeanor/Rapport:  Unable to Assess Affect (typically observed):  Unable to Assess Orientation:  Oriented to Self, Oriented to Place, Oriented to Situation, Oriented to  Time Alcohol / Substance use:  Not Applicable Psych involvement (Current and /or in the community):  No (Comment)  Discharge Needs  Concerns to be addressed:  Care Coordination Readmission within the last 30 days:  No Current discharge risk:  Dependent with Mobility Barriers to Discharge:  [place], MSW, [place] [phone] 06/20/2015 12:31 PM

## 2015-06-20 NOTE — Progress Notes (Signed)
STROKE TEAM PROGRESS NOTE   SUBJECTIVE (INTERVAL HISTORY) [name] at bedside.  She asked about intermittent sleepiness.  By my exam the finding is not inconsistent with the acute brain injury.  We also discussed that his sleep wake cycle has been disrupted.  In addition, AED can be sedating.  We discussed that he should continue on AED but I can decrease slightly.  ROS:  Patient said he had lower abdominal pressure/pain earlier in the day.  Wife states he had similar symptoms with UTI in the past  OBJECTIVE Temp:  [97.5 F (36.4 C)-98.8 F (37.1 C)] 97.6 F (36.4 C) (06/11 0952) Pulse Rate:  [90-97] 90 (06/11 0952) Cardiac Rhythm:  [-] Sinus tachycardia (06/11 0703) Resp:  [16-18] 17 (06/11 0952) BP: (111-133)/(54-65) 113/54 mmHg (06/11 0952) SpO2:  [91 %-97 %] 91 % (06/11 0952)  CBC:   Recent Labs Lab 06/19/15 1342 06/20/15 0224  WBC 6.6 6.6  NEUTROABS 5.3  --   HGB 8.0* 8.0*  HCT 26.0* 25.8*  MCV 87.5 86.6  PLT 151 161    Basic Metabolic Panel:   Recent Labs Lab 06/14/15 0215  06/19/15 1342 06/20/15 1119  NA 132*  < > 133* 133*  K 4.7  < > 3.9 4.2  CL 102  < > 104 103  CO2 19*  < > 19* 19*  GLUCOSE 291*  < > 105* 159*  BUN 14  < > 28* 29*  CREATININE 1.64*  < > 1.47* 1.44*  CALCIUM 8.8*  < > 8.9 9.5  MG 1.7  --   --   --   PHOS 2.3*  --   --   --   < > = values in this interval not displayed.  Lipid Panel:     Component Value Date/Time   CHOL 206* 06/14/2015 0215   TRIG 64 06/15/2015 0550   HDL 50 06/14/2015 0215   CHOLHDL 4.1 06/14/2015 0215   VLDL 11 06/14/2015 0215   LDLCALC 145* 06/14/2015 0215   HgbA1c:  Lab Results  Component Value Date   HGBA1C 6.8* 06/14/2015   Urine Drug Screen: No results found for: LABOPIA, COCAINSCRNUR, LABBENZ, AMPHETMU, THCU, LABBARB    IMAGING  Ct Head Wo Contrast 06/13/2015    Left posterior frontal intra cerebral hemorrhage with subarachnoid extension as described above.    Repeat CXR  6/9: FINDINGS: Stable right jugular Port-A-Cath. Low volumes with bibasilar atelectasis. Left mid lung dense pulmonary opacity is stable. No pneumothorax. IMPRESSION: Stable left mid lung dense pulmonary opacity. Bibasilar atelectasis.  Dg Chest Port 1 View 06/15/2015   Slightly better aeration. Endotracheal tube tip 3.8 cm above the carina.  06/14/2015  Stable support apparatus. Persistent opacity left hilum and left perihilar region. Postradiation fibrosis in left upper lobe again noted. Left basilar atelectasis. No pulmonary edema. No pneumothorax. Status post median sternotomy.  06/13/2015  Endotracheal tube above the carina. Stable appearing postsurgical changes and persistent opacity in the left mid lung field.  06/13/2015   Stable to slightly increased left lung opacity which may represent posttreatment changes. No other significant change.  06/16/15 - Interval removal of the endotracheal and enteric tube. Stable appearing density in the left mid lung field with overall improvement of the aeration of the left lung.  2D Echocardiogram  - Procedure narrative: Transthoracic echocardiography. Image quality was poor. The study was technically difficult. - Left ventricle: The cavity size was normal. Systolic function was mildly reduced. The estimated ejection fraction was in the range  of 45% to 50%. Wall motion was normal; there were no regional wall motion abnormalities. - Aortic valve: Valve area (Vmax): 2.82 cm^2. - Left atrium: The atrium was mildly dilated. - Right ventricle: The cavity size was mildly dilated. Wall thickness was normal. - Right atrium: The atrium was mildly dilated. - Pericardium, extracardiac: Features were not consistent with tamponade physiology. Impressions:  There was no evidence of a vegetation.  MRI brain w and w/o contrast  06/15/2015   With the rounded contour of the enhancing component of the left frontal lobe hematoma, follow-up until clearance recommended to exclude  underlying vascular abnormality. Posterior left frontal lobe complex 2.5 x 2.2 x 2 cm hemorrhagic lesion with marked surrounding vasogenic edema with breakthrough into sulci with subarachnoid hemorrhage noted. Patient's history of lung cancer in addition to 6 mm rounded area of enhanced along the posterior margin of this hemorrhage suggests that this is most likely is related to a hemorrhagic metastatic lesion which has bled. Continued MR surveillance as hemorrhage clears is recommended. There may be a a second enhancing lesion within the anterior left parietal lobe with surrounding vasogenic edema. Venous infarct can have a similar appearance. The major dural sinuses appear patent. Opacification left sphenoid sinus with air-fluid level suggesting acute sinusitis. Bilateral mastoid air cell and middle ear opacification greater on the left without obstructing lesion of the eustachian tube noted.   MRA head 06/15/2015  MR angiogram does not incorporate the left frontal lobe hemorrhage. Mild narrowing supraclinoid aspect internal carotid artery bilaterally with irregularity more notable on the left. Small infundibulum on the left posterior communicating artery level. Anterior circulation without medium or large size vessel significant stenosis or occlusion. Middle cerebral artery branch vessel irregularity bilaterally. Right vertebral artery ends in a posterior inferior cerebellar artery distribution. No significant narrowing left vertebral artery. Moderate narrowing portions of the left posterior inferior cerebellar artery. Ectatic basilar artery without high-grade stenosis. Nonvisualized anterior inferior cerebellar arteries. Small left superior cerebellar artery. Posterior cerebral artery distal branch vessel narrowing bilaterally.   CUS Right: 1-39% ICA stenosis. Left: 1-39% ICA stenosis, however, higher velocities may be obscured by significant calcific plaque at the origin of the left ICA. Bilateral  vertebral artery flow is antegrade.   LE venous doppler - negative for DVT  EEG - EEG Abnormalities: 1) mild generalized irregular slow activity Clinical Interpretation: This EEG is consistent with a mild generalized non-specific cerebral dysfunction(encephalopathy). This can be seen with sedative medications among other causes. There was no seizure or seizure predisposition recorded on this study. Please note that a normal EEG does not preclude the possibility of epilepsy.    PHYSICAL EXAM: No changes,  General - Well nourished, well developed, not in acute distress Cardiovascular - Regular rate and rhythm. Pulm:  CTA Abd:  Benign Extrem:  No C/C/E  Neuro - awake alert, not in acute distress. Communicating well, follows commands, able to repeat and name with mild dysarthria. No visual field deficit, PERRL, EOMI, facial symmetrical, tongue in middle. Left UE 5/5 UE and 4/5 LE. RUE 0/5 and RLE 1/5 to pain. DTR diminished, and no babinski. Sensation symmetrical, coordination intact LUE and gait not tested.   ASSESSMENT/PLAN Ivan Beasley is a 75 y.o. male with history of stage III lung cancer after chemo and radiation therapy, recent PNA and atrial fibrillation on eliquis presenting with R sided hemiplegia. CT showed a L frontal ICH with adjacent SAH.   ICH:  left frontal ICH with adjacent SAH while  on Eliquis. 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   Reversed with Kcentra  Resultant right hemiplegia  MRI w/ & w/o  Posterior L frontal lobe hemorrhage w/ marked surrounding vasogenic edema with SAH, concern for metastatic lesion posterior margin of hmg and anterior L parietal lobe.   Need to repeat MRI once blood all absorbed.  MRA  Atherosclerosis, no high grade stenosis  Carotid Doppler  No significant stenosis  2D Echo from 04/2015 EF 45-50%. No source of embolus, no vegetation  LE venous doppler negative for  DVT  LDL 145  HgbA1c 7.4 in April  SCDs for VTE prophylaxis Diet heart healthy/carb modified Room service appropriate?: Yes; Fluid consistency:: Thin  Eliquis (apixaban) daily prior to admission, no antithrombotics after admission  Ongoing aggressive stroke risk factor management  Therapy recommendations:  CIR   Disposition:  pending insurance approval  Transient vs. Paroxsymal Atrial Fibrillation  Home anticoagulation:  Eliquis (apixaban) daily   Stopped given hemorrhage  Found to have afib in the setting of respiratory failure  Following with Dr. Clayborn Bigness cardiology  Will defer to his outpt cardiologist Dr. Clayborn Bigness whether pt needs to be on eliquis after discharge.  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  Given cough today, will repeat CXR   Possible seizure with asystole  Treated with ativan and forphenytoin  On Keppra  EEG no seizure  Hyperlipidemia  Home meds:  No statin   LDL 145, goal < 70  Add lipitor '40mg'$   Continue lipitor on discharge  Diabetes  HgbA1c 7.4 in April, goal < 7.0  Uncontrolled  On SSI  Put back on home meds  CBG under control   Other Stroke Risk Factors  Advanced age  Former Cigarette smoker, quit 2000  Overweight, Body mass index is 28.25 kg/(m^2).   Coronary artery disease, s/p CABG 2000, MI  Other Active Problems  hypophosphatemia  Hypomagnesia  CKD stage III  Anemia - Hb 8.0; Hematocrit 25.8 - stable  Renal Function -  Recheck Sunday -> BUN 29 ; Creatinine - 1.44 - slight improvement.  Worsening renal function in setting of hypotension. IVF increased to 75/h.   Hospital day # [facility] Pager [phone] 06/20/2015, 3:33 PM  ATTENDING NOTE: Patient was seen and examined by me personally. Documentation reflects findings. The laboratory and radiographic studies reviewed by me. ROS completed by me personally and pertinent positives fully documented   Condition:stable  Assessment and plan completed by me personally and fully documented above. Plans/Recommendations include:     D/C fluids  Decrease Keppra to '1000mg'$  BID  UA sent  CXR in AM and encouraged continued ICS  SIGNED BY: Dr. Elissa Hefty       To contact Stroke Continuity provider, please refer to [email or web address] After hours, contact General Neurology

## 2015-06-20 NOTE — Clinical Social Work Note (Signed)
LCSW provided SNF bed offers to patient's wife, Ivan Beasley. Per patient's wife, patient requiring SNF placement in [place], [place] as patient's wife cannot afford to pay for transportation to and from [place] area. [name] hopeful for CIR admission on Monday (6/12), but would like to pursue [facility] as a secondary option.  43M LCSW to please follow up with [facility] on 6/12 regarding bed availability.  Lubertha Sayres, [facility]: [phone] Surgical: [phone]

## 2015-06-21 ENCOUNTER — Inpatient Hospital Stay (HOSPITAL_COMMUNITY): Payer: Medicare HMO

## 2015-06-21 ENCOUNTER — Inpatient Hospital Stay (HOSPITAL_COMMUNITY)
Admission: RE | Admit: 2015-06-21 | Discharge: 2015-08-10 | DRG: 056 | Disposition: E | Payer: Medicare HMO | Source: Intra-hospital | Attending: Physical Medicine & Rehabilitation | Admitting: Physical Medicine & Rehabilitation

## 2015-06-21 ENCOUNTER — Telehealth: Payer: Self-pay | Admitting: Internal Medicine

## 2015-06-21 DIAGNOSIS — I69120 Aphasia following nontraumatic intracerebral hemorrhage: Secondary | ICD-10-CM | POA: Diagnosis not present

## 2015-06-21 DIAGNOSIS — I611 Nontraumatic intracerebral hemorrhage in hemisphere, cortical: Secondary | ICD-10-CM | POA: Diagnosis not present

## 2015-06-21 DIAGNOSIS — G8191 Hemiplegia, unspecified affecting right dominant side: Secondary | ICD-10-CM

## 2015-06-21 DIAGNOSIS — M5489 Other dorsalgia: Secondary | ICD-10-CM | POA: Diagnosis not present

## 2015-06-21 DIAGNOSIS — E1122 Type 2 diabetes mellitus with diabetic chronic kidney disease: Secondary | ICD-10-CM | POA: Diagnosis present

## 2015-06-21 DIAGNOSIS — Z7984 Long term (current) use of oral hypoglycemic drugs: Secondary | ICD-10-CM

## 2015-06-21 DIAGNOSIS — R5383 Other fatigue: Secondary | ICD-10-CM | POA: Insufficient documentation

## 2015-06-21 DIAGNOSIS — T45515A Adverse effect of anticoagulants, initial encounter: Secondary | ICD-10-CM | POA: Diagnosis not present

## 2015-06-21 DIAGNOSIS — R64 Cachexia: Secondary | ICD-10-CM | POA: Diagnosis present

## 2015-06-21 DIAGNOSIS — R0989 Other specified symptoms and signs involving the circulatory and respiratory systems: Secondary | ICD-10-CM | POA: Insufficient documentation

## 2015-06-21 DIAGNOSIS — Z96659 Presence of unspecified artificial knee joint: Secondary | ICD-10-CM | POA: Diagnosis present

## 2015-06-21 DIAGNOSIS — G9340 Encephalopathy, unspecified: Secondary | ICD-10-CM | POA: Diagnosis not present

## 2015-06-21 DIAGNOSIS — D689 Coagulation defect, unspecified: Secondary | ICD-10-CM | POA: Diagnosis present

## 2015-06-21 DIAGNOSIS — Z7401 Bed confinement status: Secondary | ICD-10-CM

## 2015-06-21 DIAGNOSIS — Z7901 Long term (current) use of anticoagulants: Secondary | ICD-10-CM | POA: Insufficient documentation

## 2015-06-21 DIAGNOSIS — Z833 Family history of diabetes mellitus: Secondary | ICD-10-CM

## 2015-06-21 DIAGNOSIS — R059 Cough, unspecified: Secondary | ICD-10-CM

## 2015-06-21 DIAGNOSIS — D638 Anemia in other chronic diseases classified elsewhere: Secondary | ICD-10-CM | POA: Diagnosis present

## 2015-06-21 DIAGNOSIS — N390 Urinary tract infection, site not specified: Secondary | ICD-10-CM | POA: Diagnosis not present

## 2015-06-21 DIAGNOSIS — I252 Old myocardial infarction: Secondary | ICD-10-CM

## 2015-06-21 DIAGNOSIS — K5900 Constipation, unspecified: Secondary | ICD-10-CM | POA: Diagnosis present

## 2015-06-21 DIAGNOSIS — F05 Delirium due to known physiological condition: Secondary | ICD-10-CM | POA: Diagnosis not present

## 2015-06-21 DIAGNOSIS — G936 Cerebral edema: Secondary | ICD-10-CM | POA: Diagnosis present

## 2015-06-21 DIAGNOSIS — I63412 Cerebral infarction due to embolism of left middle cerebral artery: Secondary | ICD-10-CM | POA: Insufficient documentation

## 2015-06-21 DIAGNOSIS — I609 Nontraumatic subarachnoid hemorrhage, unspecified: Secondary | ICD-10-CM | POA: Diagnosis present

## 2015-06-21 DIAGNOSIS — I619 Nontraumatic intracerebral hemorrhage, unspecified: Secondary | ICD-10-CM | POA: Diagnosis present

## 2015-06-21 DIAGNOSIS — I4891 Unspecified atrial fibrillation: Secondary | ICD-10-CM | POA: Diagnosis present

## 2015-06-21 DIAGNOSIS — F4323 Adjustment disorder with mixed anxiety and depressed mood: Secondary | ICD-10-CM | POA: Insufficient documentation

## 2015-06-21 DIAGNOSIS — Z79899 Other long term (current) drug therapy: Secondary | ICD-10-CM

## 2015-06-21 DIAGNOSIS — J449 Chronic obstructive pulmonary disease, unspecified: Secondary | ICD-10-CM | POA: Diagnosis not present

## 2015-06-21 DIAGNOSIS — Z6827 Body mass index (BMI) 27.0-27.9, adult: Secondary | ICD-10-CM

## 2015-06-21 DIAGNOSIS — I69151 Hemiplegia and hemiparesis following nontraumatic intracerebral hemorrhage affecting right dominant side: Secondary | ICD-10-CM | POA: Diagnosis present

## 2015-06-21 DIAGNOSIS — R710 Precipitous drop in hematocrit: Secondary | ICD-10-CM | POA: Diagnosis not present

## 2015-06-21 DIAGNOSIS — D62 Acute posthemorrhagic anemia: Secondary | ICD-10-CM | POA: Diagnosis present

## 2015-06-21 DIAGNOSIS — I610 Nontraumatic intracerebral hemorrhage in hemisphere, subcortical: Secondary | ICD-10-CM | POA: Diagnosis not present

## 2015-06-21 DIAGNOSIS — H3530 Unspecified macular degeneration: Secondary | ICD-10-CM | POA: Diagnosis present

## 2015-06-21 DIAGNOSIS — C7931 Secondary malignant neoplasm of brain: Secondary | ICD-10-CM | POA: Diagnosis present

## 2015-06-21 DIAGNOSIS — Z515 Encounter for palliative care: Secondary | ICD-10-CM | POA: Diagnosis not present

## 2015-06-21 DIAGNOSIS — N183 Chronic kidney disease, stage 3 unspecified: Secondary | ICD-10-CM | POA: Diagnosis present

## 2015-06-21 DIAGNOSIS — R32 Unspecified urinary incontinence: Secondary | ICD-10-CM | POA: Diagnosis present

## 2015-06-21 DIAGNOSIS — D6832 Hemorrhagic disorder due to extrinsic circulating anticoagulants: Secondary | ICD-10-CM | POA: Diagnosis not present

## 2015-06-21 DIAGNOSIS — R569 Unspecified convulsions: Secondary | ICD-10-CM

## 2015-06-21 DIAGNOSIS — Z951 Presence of aortocoronary bypass graft: Secondary | ICD-10-CM

## 2015-06-21 DIAGNOSIS — Z6828 Body mass index (BMI) 28.0-28.9, adult: Secondary | ICD-10-CM

## 2015-06-21 DIAGNOSIS — T83518A Infection and inflammatory reaction due to other urinary catheter, initial encounter: Secondary | ICD-10-CM | POA: Diagnosis not present

## 2015-06-21 DIAGNOSIS — E785 Hyperlipidemia, unspecified: Secondary | ICD-10-CM | POA: Diagnosis present

## 2015-06-21 DIAGNOSIS — R05 Cough: Secondary | ICD-10-CM

## 2015-06-21 DIAGNOSIS — I480 Paroxysmal atrial fibrillation: Secondary | ICD-10-CM | POA: Diagnosis present

## 2015-06-21 DIAGNOSIS — I129 Hypertensive chronic kidney disease with stage 1 through stage 4 chronic kidney disease, or unspecified chronic kidney disease: Secondary | ICD-10-CM | POA: Diagnosis present

## 2015-06-21 DIAGNOSIS — R4781 Slurred speech: Secondary | ICD-10-CM

## 2015-06-21 DIAGNOSIS — D649 Anemia, unspecified: Secondary | ICD-10-CM | POA: Diagnosis not present

## 2015-06-21 DIAGNOSIS — R030 Elevated blood-pressure reading, without diagnosis of hypertension: Secondary | ICD-10-CM

## 2015-06-21 DIAGNOSIS — E1142 Type 2 diabetes mellitus with diabetic polyneuropathy: Secondary | ICD-10-CM | POA: Diagnosis present

## 2015-06-21 DIAGNOSIS — F0390 Unspecified dementia without behavioral disturbance: Secondary | ICD-10-CM | POA: Diagnosis present

## 2015-06-21 DIAGNOSIS — R410 Disorientation, unspecified: Secondary | ICD-10-CM | POA: Insufficient documentation

## 2015-06-21 DIAGNOSIS — D538 Other specified nutritional anemias: Secondary | ICD-10-CM | POA: Diagnosis not present

## 2015-06-21 DIAGNOSIS — D500 Iron deficiency anemia secondary to blood loss (chronic): Secondary | ICD-10-CM | POA: Diagnosis present

## 2015-06-21 DIAGNOSIS — H9190 Unspecified hearing loss, unspecified ear: Secondary | ICD-10-CM | POA: Diagnosis present

## 2015-06-21 DIAGNOSIS — I69122 Dysarthria following nontraumatic intracerebral hemorrhage: Secondary | ICD-10-CM

## 2015-06-21 DIAGNOSIS — E663 Overweight: Secondary | ICD-10-CM | POA: Diagnosis present

## 2015-06-21 DIAGNOSIS — R159 Full incontinence of feces: Secondary | ICD-10-CM | POA: Diagnosis present

## 2015-06-21 DIAGNOSIS — J323 Chronic sphenoidal sinusitis: Secondary | ICD-10-CM | POA: Diagnosis present

## 2015-06-21 DIAGNOSIS — Z66 Do not resuscitate: Secondary | ICD-10-CM | POA: Diagnosis present

## 2015-06-21 DIAGNOSIS — R0789 Other chest pain: Secondary | ICD-10-CM | POA: Diagnosis not present

## 2015-06-21 DIAGNOSIS — C3492 Malignant neoplasm of unspecified part of left bronchus or lung: Secondary | ICD-10-CM | POA: Diagnosis present

## 2015-06-21 DIAGNOSIS — I612 Nontraumatic intracerebral hemorrhage in hemisphere, unspecified: Secondary | ICD-10-CM | POA: Diagnosis not present

## 2015-06-21 DIAGNOSIS — C799 Secondary malignant neoplasm of unspecified site: Secondary | ICD-10-CM

## 2015-06-21 DIAGNOSIS — I469 Cardiac arrest, cause unspecified: Secondary | ICD-10-CM | POA: Diagnosis not present

## 2015-06-21 DIAGNOSIS — C3412 Malignant neoplasm of upper lobe, left bronchus or lung: Secondary | ICD-10-CM | POA: Diagnosis not present

## 2015-06-21 DIAGNOSIS — Z87891 Personal history of nicotine dependence: Secondary | ICD-10-CM

## 2015-06-21 DIAGNOSIS — M549 Dorsalgia, unspecified: Secondary | ICD-10-CM | POA: Insufficient documentation

## 2015-06-21 DIAGNOSIS — IMO0001 Reserved for inherently not codable concepts without codable children: Secondary | ICD-10-CM | POA: Insufficient documentation

## 2015-06-21 DIAGNOSIS — K219 Gastro-esophageal reflux disease without esophagitis: Secondary | ICD-10-CM | POA: Diagnosis present

## 2015-06-21 DIAGNOSIS — R2990 Unspecified symptoms and signs involving the nervous system: Secondary | ICD-10-CM

## 2015-06-21 DIAGNOSIS — D508 Other iron deficiency anemias: Secondary | ICD-10-CM | POA: Diagnosis not present

## 2015-06-21 DIAGNOSIS — Z923 Personal history of irradiation: Secondary | ICD-10-CM

## 2015-06-21 DIAGNOSIS — Z9221 Personal history of antineoplastic chemotherapy: Secondary | ICD-10-CM

## 2015-06-21 DIAGNOSIS — Z981 Arthrodesis status: Secondary | ICD-10-CM

## 2015-06-21 DIAGNOSIS — I2510 Atherosclerotic heart disease of native coronary artery without angina pectoris: Secondary | ICD-10-CM | POA: Diagnosis present

## 2015-06-21 DIAGNOSIS — G8101 Flaccid hemiplegia affecting right dominant side: Secondary | ICD-10-CM | POA: Insufficient documentation

## 2015-06-21 LAB — COMPREHENSIVE METABOLIC PANEL
ALBUMIN: 2.9 g/dL — AB (ref 3.5–5.0)
ALK PHOS: 137 U/L — AB (ref 38–126)
ALT: 25 U/L (ref 17–63)
ANION GAP: 10 (ref 5–15)
AST: 21 U/L (ref 15–41)
BILIRUBIN TOTAL: 0.4 mg/dL (ref 0.3–1.2)
BUN: 25 mg/dL — ABNORMAL HIGH (ref 6–20)
CALCIUM: 9.2 mg/dL (ref 8.9–10.3)
CO2: 19 mmol/L — ABNORMAL LOW (ref 22–32)
CREATININE: 1.18 mg/dL (ref 0.61–1.24)
Chloride: 106 mmol/L (ref 101–111)
GFR calc Af Amer: >60 mL/min (ref >60)
GFR calc non Af Amer: 59 mL/min — ABNORMAL LOW (ref >60)
GLUCOSE: 148 mg/dL — AB (ref 65–99)
Potassium: 3.9 mmol/L (ref 3.5–5.1)
Sodium: 135 mmol/L (ref 135–145)
TOTAL PROTEIN: 6.7 g/dL (ref 6.5–8.1)

## 2015-06-21 LAB — CBC WITH DIFFERENTIAL/PLATELET
BASOS PCT: 0 %
Basophils Absolute: 0 10*3/uL (ref 0.0–0.1)
Eosinophils Absolute: 0.2 10*3/uL (ref 0.0–0.7)
Eosinophils Relative: 3 %
HEMATOCRIT: 27.8 % — AB (ref 39.0–52.0)
HEMOGLOBIN: 8.5 g/dL — AB (ref 13.0–17.0)
LYMPHS ABS: 1 10*3/uL (ref 0.7–4.0)
Lymphocytes Relative: 14 %
MCH: 26.7 pg (ref 26.0–34.0)
MCHC: 30.6 g/dL (ref 30.0–36.0)
MCV: 87.4 fL (ref 78.0–100.0)
MONOS PCT: 11 %
Monocytes Absolute: 0.8 10*3/uL (ref 0.1–1.0)
NEUTROS ABS: 5.1 10*3/uL (ref 1.7–7.7)
NEUTROS PCT: 72 %
Platelets: 180 10*3/uL (ref 150–400)
RBC: 3.18 MIL/uL — ABNORMAL LOW (ref 4.22–5.81)
RDW: 19 % — ABNORMAL HIGH (ref 11.5–15.5)
WBC: 7 10*3/uL (ref 4.0–10.5)

## 2015-06-21 LAB — GLUCOSE, CAPILLARY
GLUCOSE-CAPILLARY: 109 mg/dL — AB (ref 65–99)
GLUCOSE-CAPILLARY: 136 mg/dL — AB (ref 65–99)
Glucose-Capillary: 104 mg/dL — ABNORMAL HIGH (ref 65–99)
Glucose-Capillary: 111 mg/dL — ABNORMAL HIGH (ref 65–99)
Glucose-Capillary: 170 mg/dL — ABNORMAL HIGH (ref 65–99)
Glucose-Capillary: 81 mg/dL (ref 65–99)

## 2015-06-21 MED ORDER — SENNOSIDES-DOCUSATE SODIUM 8.6-50 MG PO TABS
1.0000 | ORAL_TABLET | Freq: Two times a day (BID) | ORAL | Status: DC
  Administered 2015-06-22 – 2015-06-25 (×6): 1 via ORAL
  Filled 2015-06-21 (×9): qty 1

## 2015-06-21 MED ORDER — ONDANSETRON HCL 4 MG/2ML IJ SOLN: 4.0000 mg | Freq: Four times a day (QID) | INTRAMUSCULAR | Status: DC | PRN

## 2015-06-21 MED ORDER — ATORVASTATIN CALCIUM 40 MG PO TABS
40.0000 mg | ORAL_TABLET | Freq: Every day | ORAL | Status: DC
  Administered 2015-06-21 – 2015-07-13 (×19): 40 mg via ORAL
  Filled 2015-06-21 (×18): qty 1

## 2015-06-21 MED ORDER — AMIODARONE HCL 200 MG PO TABS
200.0000 mg | ORAL_TABLET | Freq: Every day | ORAL | Status: DC
  Administered 2015-06-22 – 2015-07-13 (×20): 200 mg via ORAL
  Filled 2015-06-21 (×21): qty 1

## 2015-06-21 MED ORDER — ALPRAZOLAM 0.25 MG PO TABS
0.2500 mg | ORAL_TABLET | Freq: Three times a day (TID) | ORAL | Status: DC | PRN
  Administered 2015-06-23 – 2015-07-14 (×8): 0.25 mg via ORAL
  Filled 2015-06-21 (×9): qty 1

## 2015-06-21 MED ORDER — ACETAMINOPHEN 650 MG RE SUPP: 650.0000 mg | RECTAL | Status: DC | PRN

## 2015-06-21 MED ORDER — TRAMADOL HCL 50 MG PO TABS
50.0000 mg | ORAL_TABLET | Freq: Four times a day (QID) | ORAL | Status: DC | PRN
  Administered 2015-06-22 – 2015-07-05 (×8): 50 mg via ORAL
  Filled 2015-06-21 (×8): qty 1

## 2015-06-21 MED ORDER — BOOST PLUS PO LIQD
237.0000 mL | Freq: Three times a day (TID) | ORAL | Status: DC
  Administered 2015-06-22 – 2015-07-12 (×34): 237 mL via ORAL
  Filled 2015-06-21 (×79): qty 237

## 2015-06-21 MED ORDER — DILTIAZEM HCL 60 MG PO TABS
60.0000 mg | ORAL_TABLET | Freq: Two times a day (BID) | ORAL | Status: DC
  Administered 2015-06-21 – 2015-07-13 (×39): 60 mg via ORAL
  Filled 2015-06-21 (×41): qty 1

## 2015-06-21 MED ORDER — DIPHENOXYLATE-ATROPINE 2.5-0.025 MG PO TABS
1.0000 | ORAL_TABLET | Freq: Once | ORAL | Status: AC
  Administered 2015-06-21: 1 via ORAL
  Filled 2015-06-21: qty 1

## 2015-06-21 MED ORDER — TRAMADOL HCL 50 MG PO TABS: 50.0000 mg | ORAL_TABLET | Freq: Four times a day (QID) | ORAL | Status: DC | PRN

## 2015-06-21 MED ORDER — HYDROCOD POLST-CPM POLST ER 10-8 MG/5ML PO SUER
5.0000 mL | Freq: Two times a day (BID) | ORAL | Status: DC | PRN
  Administered 2015-06-23 – 2015-06-25 (×3): 5 mL via ORAL
  Filled 2015-06-21 (×3): qty 5

## 2015-06-21 MED ORDER — METFORMIN HCL 500 MG PO TABS
500.0000 mg | ORAL_TABLET | Freq: Every day | ORAL | Status: DC
  Administered 2015-06-22 – 2015-07-13 (×17): 500 mg via ORAL
  Filled 2015-06-21 (×18): qty 1

## 2015-06-21 MED ORDER — ALBUTEROL SULFATE (2.5 MG/3ML) 0.083% IN NEBU
2.5000 mg | INHALATION_SOLUTION | Freq: Four times a day (QID) | RESPIRATORY_TRACT | Status: DC | PRN
  Administered 2015-06-26 – 2015-07-09 (×4): 2.5 mg via RESPIRATORY_TRACT
  Filled 2015-06-21 (×6): qty 3

## 2015-06-21 MED ORDER — LEVETIRACETAM 500 MG PO TABS
1000.0000 mg | ORAL_TABLET | Freq: Two times a day (BID) | ORAL | Status: DC
  Administered 2015-06-21 – 2015-06-26 (×10): 1000 mg via ORAL
  Filled 2015-06-21 (×10): qty 2

## 2015-06-21 MED ORDER — METFORMIN HCL 500 MG PO TABS
1000.0000 mg | ORAL_TABLET | Freq: Every day | ORAL | Status: DC
  Administered 2015-06-22 – 2015-07-13 (×19): 1000 mg via ORAL
  Filled 2015-06-21 (×20): qty 2

## 2015-06-21 MED ORDER — PANTOPRAZOLE SODIUM 40 MG PO TBEC
40.0000 mg | DELAYED_RELEASE_TABLET | Freq: Every day | ORAL | Status: DC
Start: 2015-06-21 — End: 2015-07-01
  Administered 2015-06-22 – 2015-06-30 (×6): 40 mg via ORAL
  Filled 2015-06-21 (×7): qty 1

## 2015-06-21 MED ORDER — GLIMEPIRIDE 2 MG PO TABS
1.0000 mg | ORAL_TABLET | Freq: Every day | ORAL | Status: DC
  Administered 2015-06-22 – 2015-07-08 (×14): 1 mg via ORAL
  Filled 2015-06-21 (×15): qty 1

## 2015-06-21 MED ORDER — ENSURE ENLIVE PO LIQD: 237.0000 mL | Freq: Three times a day (TID) | ORAL | Status: DC | PRN

## 2015-06-21 MED ORDER — ACETAMINOPHEN 325 MG PO TABS
650.0000 mg | ORAL_TABLET | ORAL | Status: DC | PRN
  Administered 2015-06-23 – 2015-07-10 (×7): 650 mg via ORAL
  Filled 2015-06-21 (×9): qty 2

## 2015-06-21 MED ORDER — INSULIN ASPART 100 UNIT/ML ~~LOC~~ SOLN
0.0000 [IU] | SUBCUTANEOUS | Status: DC
  Administered 2015-06-22 (×2): 2 [IU] via SUBCUTANEOUS
  Administered 2015-06-22 – 2015-06-23 (×3): 4 [IU] via SUBCUTANEOUS

## 2015-06-21 MED ORDER — SORBITOL 70 % SOLN: 30.0000 mL | Freq: Every day | Status: DC | PRN

## 2015-06-21 MED ORDER — ONDANSETRON HCL 4 MG PO TABS: 4.0000 mg | ORAL_TABLET | Freq: Four times a day (QID) | ORAL | Status: DC | PRN

## 2015-06-21 MED ORDER — CETYLPYRIDINIUM CHLORIDE 0.05 % MT LIQD
7.0000 mL | Freq: Two times a day (BID) | OROMUCOSAL | Status: DC
  Administered 2015-06-21 – 2015-07-14 (×43): 7 mL via OROMUCOSAL

## 2015-06-21 MED ORDER — OXYCODONE HCL 5 MG PO TABS
5.0000 mg | ORAL_TABLET | Freq: Four times a day (QID) | ORAL | Status: DC | PRN
  Administered 2015-06-21: 5 mg via ORAL
  Filled 2015-06-21: qty 1

## 2015-06-21 MED ORDER — OXYCODONE HCL 5 MG PO TABS
5.0000 mg | ORAL_TABLET | Freq: Four times a day (QID) | ORAL | Status: DC | PRN
  Administered 2015-06-23 – 2015-07-14 (×10): 5 mg via ORAL
  Filled 2015-06-21 (×12): qty 1

## 2015-06-21 NOTE — Progress Notes (Signed)
Physical Therapy Note    06/17/2015 1200  PT Visit Information  Last PT Received On 07/03/2015  Assistance Needed +2  PT/OT/SLP Co-Evaluation/Treatment Yes  Reason for Co-Treatment Complexity of the patient's impairments (multi-system involvement)  PT goals addressed during session Mobility/safety with mobility  History of Present Illness 75 y.o. male who presented on 6/4 with right-sided hemi-plegia. PMHx: Metastatic lung cancer, Recent pneumonia, CAD, Arithritis, Back pain, R TKA, GERD, DM, Impaired hearing, Macular degeneration, MI. CT on 6/4 + for left frontal intracerebral hemorrhage, with subarachnoid hemorrhage as well adjacent to it. MRI on 6/6 showed posterior L frontal lobe hemorrhage w/ marked surrounding vasogenic edema with SAH, concern for metastatic lesion posterior margin of hmg and anterior L parietal lobe.    Subjective Data  Subjective RN staff asked for assistance transferring pt back to bed  Bed Mobility  Overal bed mobility Needs Assistance  Bed Mobility Rolling;Sit to Sidelying  Rolling Mod assist  Sit to sidelying Max assist;+2 for physical assistance  General bed mobility comments assist to safely lower trunk to bed and maxA to bring LEs up onto bed, pt able to assist with rolling to the R  Transfers  Overall transfer level Needs assistance  Equipment used 2 person hand held assist  Transfers Stand Pivot Transfers  Stand pivot transfers Max assist;+2 physical assistance  General transfer comment used gait belt with bed pad below hips with blocking R knee to achieve full standing x 3. pt with optimal position when PT moved to front and placed tactile cues at posterior hips to bring hips forward to achieve full upright posture  General Comments  General comments (skin integrity, edema, etc.) pt c/o "feels like blisters on my bottom" RN called into room, no blisters noted but L cheek red, barrier cream applied  PT - End of Session  Equipment Utilized During Treatment  Gait belt  Activity Tolerance Patient tolerated treatment well  Patient left in bed;with call bell/phone within reach;with nursing/sitter in room (left with OT)  PT - Assessment/Plan  PT Plan Current plan remains appropriate  PT Frequency (ACUTE ONLY) Min 4X/week  PT Time Calculation  PT Start Time (ACUTE ONLY) 1206  PT Stop Time (ACUTE ONLY) 1225  PT Time Calculation (min) (ACUTE ONLY) 19 min  PT General Charges  $$ ACUTE PT VISIT 1 Procedure  PT Treatments  $Therapeutic Activity 8-22 mins    Kittie Plater, PT, DPT Pager #: [phone] Office #: [phone]

## 2015-06-21 NOTE — Progress Notes (Signed)
Speech Language Pathology Treatment: Dysphagia  Patient Details Name: Ivan Beasley MRN: [id number] DOB: 1940/03/19 Today's Date: 06/26/2015 Time: 3845-3646 SLP Time Calculation (min) (ACUTE ONLY): 9 min  Assessment / Plan / Recommendation Clinical Impression  Pt consumed regular texture, thin liquids without oral difficulty or s/s aspiration. Texture was upgraded since previous ST session to regular. Pt and wife report he has been tolerating. Pt's attention and awareness has improved from initial assessment. He is transferring to CIR today where he will continue to work on cognitive rehabilitation although he may getting close to baseline.   HPI HPI: 75 y.o. male patient with PMH: GERD, pna, CAD, DM, MI, cancer and chronic admitted with right-sided hemi-plegia. CT of the head revealed left frontal intracerebral hemorrhage, with subarachnoid hemorrhage as well adjacent to it. Intubated approximately 24-30 hours (extubated 6/6).      SLP Plan  Continue with current plan of care     Recommendations  Diet recommendations: Regular;Thin liquid Liquids provided via: Cup;Straw Medication Administration: Whole meds with liquid Supervision: Patient able to self feed;Intermittent supervision to cue for compensatory strategies Compensations: Slow rate;Small sips/bites Postural Changes and/or Swallow Maneuvers: Seated upright 90 degrees             Oral Care Recommendations: Oral care BID Follow up Recommendations: Inpatient Rehab Plan: Continue with current plan of care     GO                Houston Siren 06/20/2015, 3:08 PM   Orbie Pyo Colvin Caroli.Ed [employer] [phone]

## 2015-06-21 NOTE — Progress Notes (Signed)
Cristina Gong, RN Rehab Admission Coordinator Signed Physical Medicine and Rehabilitation PMR Pre-admission 07/04/2015 11:23 AM  Related encounter: Admission (Current) from 06/13/2015 in [facility] [facility]   PMR Admission Coordinator Pre-Admission Assessment  Patient: Ivan Beasley is an 75 y.o., male MRN: [id number] DOB: Mar 28, 1940 Height: 6' (182.9 cm) Weight: 94.5 kg (208 lb 5.4 oz)  Insurance Information HMO: yes PPO: PCP: IPA: 80/20: OTHER: medicare replacement policy PRIMARY: [employer] Medicare Policy#: [id number] Subscriber: pt CM Name: Lance Muss. Phone#: [phone] ext [phone] Fax#: [phone] Pre-Cert#: [id number] Employer: retired approved for 7 days Benefits: Phone #: [phone] Name: 06/17/15 Eff. Date: 01/09/13 Deduct: none Out of Pocket Max: $5900 Life Max: none CIR: $295 copay per day days 1-6 then covers 100% SNF: no copay days 1-20; $160 copay days 21-100 Outpatient: $10 copay per visit Co-Pay: no visit limit Home Health: 100% Co-Pay: no visit limit DME: 80% Co-Pay: 20% Providers: in network  SECONDARY: none  Medicaid Application Date: Case Manager:  Disability Application Date: Case Worker:   Emergency Facilities manager Information    Name Relation Home Work Mobile   Rowena A 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 Presented with 06/13/2015 to [facility] with altered mental status and right-sided weakness. CT of the head revealed left frontal intracerebral hemorrhage with subarachnoid hemorrhage. Patient's eliquis was discontinued. Patient was transferred to [facility] for further evaluation. MRI of the brain showed posterior left frontal lobe complex 2.5 x 2.2 x 2 cm hemorrhagic lesion with marked surrounding vasogenic edema with breakthrough into sulci with subarachnoid hemorrhage noted. Maintained on Keppra for seizure prophylaxis. EEG mild generalized nonspecific cerebral dysfunction consistent with encephalopathy. Recent echocardiogram with ejection fraction of 50% without embolus. Lower extremity Dopplers negative. Plan to repeat MRI for further evaluation of possible metastases posterior left frontal lobe after ICH absorbed. No current plan to resume eliquis at this time. Currently on a mechanical soft thin liquid diet.    Total: 9 NIH    Past Medical History  Past Medical History  Diagnosis Date  . Coronary artery disease   . Pneumonia 2000  . Arthritis   . 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 Bruises easily   . GERD (gastroesophageal reflux disease)     takes Omeprazole daily as needed for stomach pain  . Blood transfusion 2001  . Diabetes mellitus     takes Metformin daily;  . Impaired hearing     bil hearing aide  . Macular degeneration     being watched for this but hasn't been "completely" diagnosed  . Myocardial infarction ([place]) 2001  . Cancer [facility])     Family History  family history includes Diabetes type II in his other. There is  no history of Anesthesia problems, Hypotension, Malignant hyperthermia, or Pseudochol deficiency.  Prior Rehab/Hospitalizations:  Has the patient had major surgery during 100 days prior to  admission? No  Current Medications   Current facility-administered medications:  . acetaminophen (TYLENOL) tablet 650 mg, 650 mg, Oral, Q4H PRN, 650 mg at 06/28/2015 0832 **OR** acetaminophen (TYLENOL) suppository 650 mg, 650 mg, Rectal, Q4H PRN, Ram Fuller Mandril, MD . albuterol (PROVENTIL) (2.5 MG/3ML) 0.083% nebulizer solution 2.5 mg, 2.5 mg, Nebulization, Q6H PRN, Rosalin Hawking, MD . ALPRAZolam Duanne Moron) tablet 0.25 mg, 0.25 mg, Oral, TID PRN, Rosalin Hawking, MD, 0.25 mg at 06/17/15 2024 . amiodarone (PACERONE) tablet 200 mg, 200 mg, Oral, Daily, Ram Fuller Mandril, MD, 200 mg at 07/02/2015 1042 . antiseptic oral rinse (CPC / CETYLPYRIDINIUM CHLORIDE 0.05%) solution 7 mL, 7 mL, Mouth Rinse, BID, Rosalin Hawking, MD, 7 mL at 06/20/15 2032 . atorvastatin (LIPITOR) tablet 40 mg, 40 mg, Oral, q1800, Rosalin Hawking, MD, 40 mg at 06/20/15 1854 . chlorpheniramine-HYDROcodone (TUSSIONEX) 10-8 MG/5ML suspension 5 mL, 5 mL, Oral, Q12H PRN, Grace Bushy Minor, NP, 5 mL at 06/20/15 2031 . diltiazem (CARDIZEM) tablet 60 mg, 60 mg, Oral, BID, Rosalin Hawking, MD, 60 mg at 06/17/2015 1042 . feeding supplement (ENSURE ENLIVE) (ENSURE ENLIVE) liquid 237 mL, 237 mL, Oral, TID PRN, Garvin Fila, MD . glimepiride (AMARYL) tablet 1 mg, 1 mg, Oral, QAC breakfast, Rosalin Hawking, MD, 1 mg at 06/13/2015 0828 . insulin aspart (novoLOG) injection 0-24 Units, 0-24 Units, Subcutaneous, Q4H, Ram Fuller Mandril, MD, 2 Units at 06/27/2015 0029 . labetalol (NORMODYNE,TRANDATE) injection 10-40 mg, 10-40 mg, Intravenous, Q10 min PRN, Ram Fuller Mandril, MD . lactose free nutrition (BOOST PLUS) liquid 237 mL, 237 mL, Oral, TID WC, Collene Gobble, MD, 237 mL at 07/03/2015 1042 . levETIRAcetam (KEPPRA) tablet 1,000 mg, 1,000 mg,  Oral, BID, Catha Gosselin, MD, 1,000 mg at 06/12/2015 1042 . metFORMIN (GLUCOPHAGE) tablet 1,000 mg, 1,000 mg, Oral, Q breakfast, Rosalin Hawking, MD, 1,000 mg at 06/17/2015 0828 . metFORMIN (GLUCOPHAGE) tablet 500 mg, 500 mg, Oral, Q supper, Rosalin Hawking, MD, 500 mg at 06/20/15 1854 . oxyCODONE (Oxy IR/ROXICODONE) immediate release tablet 5 mg, 5 mg, Oral, Q6H PRN, Donzetta Starch, NP, 5 mg at 06/26/2015 1154 . pantoprazole (PROTONIX) EC tablet 40 mg, 40 mg, Oral, Daily, Rosalin Hawking, MD, 40 mg at 06/13/2015 1042 . senna-docusate (Senokot-S) tablet 1 tablet, 1 tablet, Oral, BID, Ram Fuller Mandril, MD, 1 tablet at 06/12/2015 1042 . traMADol (ULTRAM) tablet 50 mg, 50 mg, Oral, Q6H PRN, Donzetta Starch, NP  Facility-Administered Medications Ordered in Other Encounters:  . 0.9 % sodium chloride infusion, , Intravenous, Continuous, Forest Gleason, MD, Stopped at 12/11/14 1034 . lidocaine-prilocaine (EMLA) cream, , Topical, Once, Evlyn Kanner, NP . sodium chloride 0.9 % injection 10 mL, 10 mL, Intracatheter, PRN, Forest Gleason, MD, 10 mL at 06/03/14 1106 . sodium chloride 0.9 % injection 10 mL, 10 mL, Intravenous, PRN, Forest Gleason, MD, 10 mL at 07/30/14 0920 . sodium chloride 0.9 % injection 10 mL, 10 mL, Intracatheter, PRN, Forest Gleason, MD, 10 mL at 12/11/14 0912 . sodium chloride flush (NS) 0.9 % injection 10 mL, 10 mL, Intravenous, PRN, Forest Gleason, MD, 10 mL at 05/05/15 0848  Patients Current Diet: Diet heart healthy/carb modified Room service appropriate?: Yes; Fluid consistency:: Thin  Precautions / Restrictions Precautions Precautions: Fall Precaution Comments: R hemi, pt on home O2 PTA for recent pneumonia (but used it PRN, not 24/7) Restrictions Weight Bearing Restrictions: No   Has the patient had 2 or more falls or a fall with injury in the past year?No  Prior  Activity Level Community (5-7x/wk): Independent and driving pta. 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 Retired Nurse, learning disability / Paramedic Devices/Equipment: None Home Equipment: None  Prior Device Use: Indicate devices/aids used by the patient prior to current illness, exacerbation or injury? None of the above  Prior Functional Level Prior Function Level of Independence: Independent Comments: Intermittently wears a brace on both knees due to pain. Pt is s/p TKR R knee  Self Care: Did the patient need help bathing, dressing, using the toilet or eating? Independent  Indoor Mobility: Did the patient need assistance with walking from room to room (with or without device)? Independent  Stairs: Did the patient need assistance with internal or external stairs (with or without device)? Independent  Functional Cognition: Did the patient need help planning regular tasks such as shopping or remembering to take medications? Independent  Current Functional Level Cognition  Arousal/Alertness: Awake/alert Overall Cognitive Status: Impaired/Different from baseline Current Attention Level: Selective Orientation Level: Oriented X4 Following Commands: Follows one step commands consistently Safety/Judgement: Decreased awareness of safety, Decreased awareness of deficits General Comments: pt demo's concentration and focus to tasks at hand but con't to have R sided neglect Attention: Sustained Sustained Attention: Impaired Sustained Attention Impairment: Functional basic Memory: ( recalled 2 items after 60 sec, needs further testing) Awareness: Impaired Awareness Impairment: Intellectual impairment, Emergent impairment, Anticipatory impairment Problem Solving: Impaired Problem Solving Impairment: Verbal basic, Functional basic Safety/Judgment: Impaired   Extremity Assessment (includes Sensation/Coordination)  Upper Extremity Assessment: Defer to OT evaluation (pt was able to flex 2-4th fingers when asked to squeeze R UE) RUE Deficits /  Details: No AROM in R UE. PROM WFL. Pt/wife reports he was able to wiggle his thumb last night and earlier today. Pt reports no changes in sensation. RUE Coordination: decreased fine motor, decreased gross motor  Lower Extremity Assessment: RLE deficits/detail RLE Deficits / Details: right leg currently with dense hemipelgia. No trace muscle activation of ankle (PF/DF), knee flex/ext, and hip flexion (ext not tested) RLE Sensation: (pt able to localize touch)    ADLs  Overall ADL's : Needs assistance/impaired Eating/Feeding: Sitting, Minimal assistance Eating/Feeding Details (indicate cue type and reason): poor appetite, assist to set up, pt with tremor interfering Grooming: Minimal assistance, Sitting, Wash/dry face Upper Body Bathing: Maximal assistance, Sitting Lower Body Bathing: Total assistance, Bed level Upper Body Dressing : Maximal assistance, Sitting Lower Body Dressing: Total assistance, Bed level Toileting- Clothing Manipulation and Hygiene: Total assistance, Bed level General ADL Comments: Performed PROM R UE in supine. Wife reports R UE flexion with yawning and thumb movement, but did not observe this visit. Pt tolerating activity well.     Mobility  Overal bed mobility: Needs Assistance Bed Mobility: Rolling, Sit to Sidelying Rolling: Mod assist Sidelying to sit: Max assist Supine to sit: Max assist, +2 for physical assistance Sit to sidelying: Max assist, +2 for physical assistance General bed mobility comments: assist to safely lower trunk to bed and maxA to bring LEs up onto bed, pt able to assist with rolling to the R    Transfers  Overall transfer level: Needs assistance Equipment used: 2 person hand held assist Transfer via Lift Equipment: [employer] Transfers: Sit to/from Stand, Risk manager Sit to Stand: Max assist, +2 physical assistance Stand pivot transfers: Max assist, +2 physical assistance General transfer comment: used gait belt with  bed pad below hips with blocking R knee to achieve full standing x 3. pt  with optimal position when PT moved to front and placed tactile cues at posterior hips to bring hips forward to achieve full upright posture    Ambulation / Gait / Stairs / Wheelchair Mobility  Ambulation/Gait General Gait Details: unable at this time.     Posture / Balance Dynamic Sitting Balance Sitting balance - Comments: when set up in optimal position pt able to maintain midline x 15 seconds prior to leaning to the R. Balance Overall balance assessment: Needs assistance Sitting-balance support: Feet supported Sitting balance-Leahy Scale: Poor Sitting balance - Comments: when set up in optimal position pt able to maintain midline x 15 seconds prior to leaning to the R. Postural control: Right lateral lean Standing balance-Leahy Scale: Zero Standing balance comment: +2 max assist to maintain standing    Special needs/care consideration Continuous Drip IV yes Skin pereanal area reddened secondary to diarhea  Bowel mgmt: inconitnent Bladder mgmt: condom catheter. Wife reports urgency Diabetic mgmt Hgb A1c 7.4 in April   Previous [place]: Spouse/significant other Lives With: Spouse Available Help at Discharge: Family, Available 24 hours/day Type of Home: [place]: One level Home Access: Stairs to enter Entrance Stairs-Rails: None Entrance Stairs-Number of Steps: 1 Bathroom Shower/Tub: Government social research officer Accessibility: No (not sure that a WC would fit into the bathroom) [place]: No  Discharge Living Setting Plans for Discharge Living Setting: Patient's home, Lives with (comment) (spouse) Type of Home at Discharge: House Discharge Home Layout: One level Discharge Home Access: Stairs to enter Entrance Stairs-Rails: None Entrance Stairs-Number of Steps: 1 Discharge Bathroom Shower/Tub:  Tub/shower unit Discharge Bathroom Toilet: Standard Discharge Bathroom Accessibility: No (not sure if w/c would fit into bathroom) Does the patient have any problems obtaining your medications?: No  Social/Family/Support Systems Patient Roles: Spouse, Parent Contact Information: Olivia Mackie, wife Anticipated Caregiver: wife and daughter Anticipated Caregiver's Contact Information: see above Caregiver Availability: 24/7 Discharge Plan Discussed with Primary Caregiver: Yes Is Caregiver In Agreement with Plan?: Yes Does Caregiver/Family have Issues with Lodging/Transportation while Pt is in Rehab?: No  Goals/Additional Needs Patient/Family Goal for Rehab: Min assist PT, OT, and SLP Expected length of stay: ELOS 18-21 days Pt/Family Agrees to Admission and willing to participate: Yes Program Orientation Provided & Reviewed with Pt/Caregiver Including Roles & Responsibilities: Yes  Decrease burden of Care through IP rehab admission: n/a  Possible need for SNF placement upon discharge: Wife likely open to SNF if pt does not reach level she can manage at home after CIR.  Patient Condition: This patient's medical and functional status has changed since the consult dated: 06/16/2015 in which the Rehabilitation Physician determined and documented that the patient's condition is appropriate for intensive rehabilitative care in an inpatient rehabilitation facility. See "History of Present Illness" (above) for medical update. Functional changes are: max assist. Patient's medical and functional status update has been discussed with the Rehabilitation physician and patient remains appropriate for inpatient rehabilitation. Will admit to inpatient rehab today.  Preadmission Screen Completed By: Cleatrice Burke, 06/11/2015 2:51 PM ______________________________________________________________________  Discussed status with Dr. Naaman Plummer on 06/20/2015 at 1450 and received telephone approval for admission  today.  Admission Coordinator: Cleatrice Burke, time 1093 Date 07/05/2015          Cosigned by: Meredith Staggers, MD at 06/26/2015 3:18 PM  Revision History     Date/Time User Provider Type Action   06/24/2015 3:18 PM Meredith Staggers, MD Physician Cosign   06/17/2015 2:51 PM  Cristina Gong, RN Rehab Admission Coordinator Sign

## 2015-06-21 NOTE — H&P (View-Only) (Signed)
Physical Medicine and Rehabilitation Admission H&P    Chief complaint: Weakness  HPI: Ivan Beasley is a 75 y.o. 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 Per chart review patient is married and independent prior to admission and driving short distances. One level home with 2 steps to entry. Presented with 06/13/2015 to [facility] with altered mental status and right-sided weakness. CT of the head revealed left frontal intracerebral hemorrhage with subarachnoid hemorrhage. Patient's eliquis was discontinued. Patient was transferred to [facility] for further evaluation. MRI of the brain showed posterior left frontal lobe complex 2.5 x 2.2 x 2 cm hemorrhagic lesion with marked surrounding vasogenic edema with breakthrough into sulci with subarachnoid hemorrhage noted. Maintained on Keppra for seizure prophylaxis. EEG mild generalized nonspecific cerebral dysfunction consistent with encephalopathy. Recent echocardiogram with ejection fraction of 50% without embolus. Lower extremity Dopplers negative. Plan to repeat MRI for further evaluation of possible metastases posterior left frontal lobe after ICH absorbed. No current plan to resume eliquis at this time. Currently on a mechanical soft thin liquid diet. Physical and Occupational therapy evaluations completed with recommendations of physical medicine rehabilitation consult. Patient was admitted for comprehensive rehabilitation program.  ROS Constitutional: Negative for fever and chills.  HENT: Positive for hearing loss.  Eyes: Negative for blurred vision and double vision.  Respiratory: Positive for cough.  Negative for shortness of breath.  Cardiovascular: Positive for palpitations and leg swelling. Negative for chest pain.  Gastrointestinal: Positive for constipation. Negative for nausea and vomiting.   GERD  Genitourinary: Positive for frequency. Negative for dysuria and hematuria.  Musculoskeletal: Positive for myalgias and back pain.  Skin: Negative for rash.  Neurological: Positive for dizziness, weakness and headaches. Negative for loss of consciousness.  All other systems reviewed and are negative   Past Medical History  Diagnosis Date  . Coronary artery disease   . Pneumonia 2000  . Arthritis   . 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 Bruises easily   . GERD (gastroesophageal reflux disease)     takes Omeprazole daily as needed for stomach pain  . Blood transfusion 2001  . Diabetes mellitus     takes Metformin daily;  . Impaired hearing     bil hearing aide  . Macular degeneration     being watched for this but hasn't been "completely" diagnosed  . Myocardial infarction ([place]) 2001  . Cancer [facility])    Past Surgical History  Procedure Laterality Date  . 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 Cardiac catheterization  2001  . Tonsillectomy      as a child  . Colonoscopy    . Cataract extraction      bilateral  . Lumbar laminectomy/decompression microdiscectomy  12/19/2010    Procedure: LUMBAR LAMINECTOMY/DECOMPRESSION MICRODISCECTOMY;  Surgeon: Elaina Hoops;  Location: [facility];  Service: Neurosurgery;  Laterality: N/A;  Lumbar three-four, four-five decompressive lumbar laminectomy  . Joint replacement  right tkr  . Eye surgery      cataracts  . Back surgery      l4 5  . Portacath placement N/A 05/20/2014    Procedure: INSERTION PORT-A-CATH;  Surgeon:  Nestor Lewandowsky, MD;  Location: [facility] ORS;  Service: General;  Laterality: N/A;   Family History  Problem Relation Age of Onset  . Anesthesia problems Neg Hx   . Hypotension Neg Hx   .  Malignant hyperthermia Neg Hx   . Pseudochol deficiency Neg Hx   . Diabetes type II Other    Social History:  reports that he quit smoking about 16 years ago. His smoking use included Cigarettes. He quit after 40 years of use. He has never used smokeless tobacco. He reports that he does not drink alcohol or use illicit drugs. Allergies: No Known Allergies Medications Prior to Admission  Medication Sig Dispense Refill  . albuterol (PROVENTIL) (2.5 MG/3ML) 0.083% nebulizer solution Take 3 mLs (2.5 mg total) by nebulization every 6 (six) hours as needed for wheezing or shortness of breath. 75 mL 2  . ALPRAZolam (XANAX) 0.25 MG tablet Take 1 tablet (0.25 mg total) by mouth 3 (three) times daily as needed for anxiety. 20 tablet 0  . amiodarone (PACERONE) 200 MG tablet Take 1 tablet (200 mg total) by mouth daily. 30 tablet 0  . amoxicillin-clavulanate (AUGMENTIN) 875-125 MG tablet Take 1 tablet by mouth 2 (two) times daily. 20 tablet 0  . apixaban (ELIQUIS) 5 MG TABS tablet Take 1 tablet (5 mg total) by mouth 2 (two) times daily. 60 tablet 0  . benzonatate (TESSALON) 100 MG capsule TAKE 1 CAPSULE (100 MG TOTAL) BY MOUTH 3 (THREE) TIMES DAILY AS NEEDED FOR COUGH. 90 capsule 3  . bismuth subsalicylate (KAOPECTATE) 262 MG/15ML suspension Take 30 mLs by mouth 3 (three) times daily as needed. 360 mL 0  . Blood Glucose Calibration (TRUETEST CONTROL LEVEL 1) LIQD Use as directed. Reported on 04/17/2015    . diltiazem (CARDIZEM) 60 MG tablet Take 60 mg by mouth 2 (two) times daily.  6  . diphenoxylate-atropine (LOMOTIL) 2.5-0.025 MG tablet TAKE 1 TABLET EVERY 4 HOURS AS NEEDED FOR DIARRHEA /LOOSE STOOL 45 tablet 1  . glimepiride (AMARYL) 1 MG tablet Take 1 mg by mouth daily.     Marland Kitchen glucose blood test strip Use as directed.    Elmore Guise Devices (TRUEDRAW LANCING DEVICE) MISC Use as directed.    . lidocaine-prilocaine (EMLA) cream Apply 1 application topically See admin instructions. 1 application to site area  topically as directed.    . magnesium oxide (MAG-OX) 400 MG tablet Take 400 mg by mouth daily.      . Melatonin 5 MG CAPS Take 1 capsule (5 mg total) by mouth at bedtime as needed.  0  . metFORMIN (GLUCOPHAGE) 500 MG tablet Take 500-1,000 mg by mouth See admin instructions. Take 1000 mg by mouth in the morning,and take 500 mg by mouth every night at bedtime.    Marland Kitchen omeprazole (PRILOSEC) 40 MG capsule Take 40 mg by mouth daily.     Marland Kitchen oxyCODONE (ROXICODONE) 5 MG immediate release tablet Take 1 tablet (5 mg total) by mouth every 6 (six) hours as needed for severe pain. 30 tablet 0  . predniSONE (DELTASONE) 10 MG tablet Take 6 tablets (60 mg total) by mouth daily with breakfast. Taper by 86m every day until complete 21 tablet 0  . SYMBICORT 160-4.5 MCG/ACT inhaler Inhale 2 puffs into the lungs 2 (two) times daily.   12  . tiotropium (SPIRIVA) 18 MCG inhalation capsule Place 1 capsule (18 mcg total) into inhaler and inhale daily. 30 capsule 0  . traMADol (ULTRAM) 50 MG tablet Take 50 mg by  mouth every 6 (six) hours as needed for moderate pain or severe pain.       Home: Home Living Family/patient expects to be discharged to:: Private residence Living Arrangements: Spouse/significant other Available Help at Discharge: Family, Available 24 hours/day Type of Home: House Home Access: Stairs to enter Technical brewer of Steps: 1 Entrance Stairs-Rails: None Home Layout: One level Bathroom Shower/Tub: Government social research officer Accessibility: No (not sure that a WC would fit into the bathroom) Home Equipment: None  Lives With: Spouse   Functional History: Prior Function Level of Independence: Independent  Functional Status:  Mobility: Bed Mobility Overal bed mobility: Needs Assistance, +2 for physical assistance Bed Mobility: Rolling, Sidelying to Sit Rolling: +2 for physical assistance, Max assist Sidelying to sit: +2 for physical assistance, Max  assist General bed mobility comments: Pt able to help pull trunk by pulling with left arm on bed rail.  Rolled to left side and pt able to help push up onto left elbow.  Pt managing his left leg independently, needes assist to progress right leg to EOB.  Assist also to weight shift to scoot once sitting.  Transfers Overall transfer level: Needs assistance Transfer via Lift Equipment: [place] transfer comment: Total lift used to get pt OOB to chair for the benefits of being upright.  Lift sling donned in sitting EOB.   Ambulation/Gait General Gait Details: unable at this time.     ADL: ADL Overall ADL's : Needs assistance/impaired Eating/Feeding: Maximal assistance, Sitting Eating/Feeding Details (indicate cue type and reason): Pt attempted to self feed with L hand; able to take small bites x3 then requesting wife to assist.  Grooming: Maximal assistance, Bed level Upper Body Bathing: Maximal assistance, Sitting Lower Body Bathing: Total assistance, Bed level Upper Body Dressing : Maximal assistance, Sitting Lower Body Dressing: Total assistance, Bed level General ADL Comments: Pt with decreased trunk control and unable to sit EOB without max assist for support and balance. Did not attempt transfers at this time. Pt tearful at times when discussing OT plan of care and d/c recommendations.  Cognition: Cognition Overall Cognitive Status: Impaired/Different from baseline Arousal/Alertness: Awake/alert Orientation Level: Oriented X4 Attention: Sustained Sustained Attention: Impaired Sustained Attention Impairment: Functional basic Memory:  ( recalled 2 items after 60  sec, needs further testing) Awareness: Impaired Awareness Impairment: Intellectual impairment, Emergent impairment, Anticipatory impairment Problem Solving: Impaired Problem Solving Impairment: Verbal basic, Functional basic Safety/Judgment: Impaired Cognition Arousal/Alertness: Awake/alert Behavior During  Therapy: WFL for tasks assessed/performed (better affect with PT) Overall Cognitive Status: Impaired/Different from baseline Area of Impairment: Awareness Current Attention Level: Focused Safety/Judgement: Decreased awareness of safety, Decreased awareness of deficits Awareness: Emergent Problem Solving: Requires verbal cues  Physical Exam: Blood pressure 127/68, pulse 88, temperature 98.4 F (36.9 C), temperature source Oral, resp. rate 18, height 6' (1.829 m), weight 94.5 kg (208 lb 5.4 oz), SpO2 98 %. Physical Exam Vitals reviewed. Gen: more alert today, sitting to right side HENT: oral mucosa moist Head: Normocephalic.  Eyes: EOM are normal.  Neck: Normal range of motion. Neck supple. No thyromegaly present.  Cardiovascular:  Cardiac rate controlled  Respiratory: Effort normal and breath sounds normal. No wheezes or rales.  GI: Soft. Bowel sounds are normal. He exhibits no distension.  Neurological: He is alert.   He is oriented to person place as well as his date of birth. Follow   Skin: Skin is warm and dry. Right central 7 1+ right finger flexors otherwise 0/5 in the right  upper extremity at the deltoid and bicep tricep and finger extensors. tr/5 in the right hip flexors and 0/5 knee extensors ankle dorsiflexor and plantar flexor.  5/5 in the left deltoid, biceps, triceps, grip, hip flexor, knee extensor, ankle dorsiflexor Sensation is absent to light touch in the right upper and right lower limb. He does sense and can differentiate a pinch in the right arm and leg. Psych: flat but cooperative  Results for orders placed or performed during the hospital encounter of 06/13/15 (from the past 48 hour(s))  Glucose, capillary     Status: Abnormal   Collection Time: 06/15/15  7:46 AM  Result Value Ref Range   Glucose-Capillary 178 (H) 65 - 99 mg/dL   Comment 1 Notify RN    Comment 2 Document in Chart   Glucose, capillary     Status: Abnormal   Collection Time: 06/15/15 11:16 AM   Result Value Ref Range   Glucose-Capillary 193 (H) 65 - 99 mg/dL   Comment 1 Notify RN    Comment 2 Document in Chart   Glucose, capillary     Status: Abnormal   Collection Time: 06/15/15  3:37 PM  Result Value Ref Range   Glucose-Capillary 260 (H) 65 - 99 mg/dL   Comment 1 Notify RN    Comment 2 Document in Chart   Glucose, capillary     Status: Abnormal   Collection Time: 06/15/15  7:30 PM  Result Value Ref Range   Glucose-Capillary 260 (H) 65 - 99 mg/dL  Glucose, capillary     Status: Abnormal   Collection Time: 06/15/15 11:07 PM  Result Value Ref Range   Glucose-Capillary 207 (H) 65 - 99 mg/dL  Glucose, capillary     Status: Abnormal   Collection Time: 06/16/15  3:25 AM  Result Value Ref Range   Glucose-Capillary 136 (H) 65 - 99 mg/dL  Procalcitonin     Status: None   Collection Time: 06/16/15  5:11 AM  Result Value Ref Range   Procalcitonin 0.12 ng/mL    Comment:        Interpretation: PCT (Procalcitonin) <= 0.5 ng/mL: Systemic infection (sepsis) is not likely. Local bacterial infection is possible. (NOTE)         ICU PCT Algorithm               Non ICU PCT Algorithm    ----------------------------     ------------------------------         PCT < 0.25 ng/mL                 PCT < 0.1 ng/mL     Stopping of antibiotics            Stopping of antibiotics       strongly encouraged.               strongly encouraged.    ----------------------------     ------------------------------       PCT level decrease by               PCT < 0.25 ng/mL       >= 80% from peak PCT       OR PCT 0.25 - 0.5 ng/mL          Stopping of antibiotics  encouraged.     Stopping of antibiotics           encouraged.    ----------------------------     ------------------------------       PCT level decrease by              PCT >= 0.25 ng/mL       < 80% from peak PCT        AND PCT >= 0.5 ng/mL            Continuin g antibiotics                                               encouraged.       Continuing antibiotics            encouraged.    ----------------------------     ------------------------------     PCT level increase compared          PCT > 0.5 ng/mL         with peak PCT AND          PCT >= 0.5 ng/mL             Escalation of antibiotics                                          strongly encouraged.      Escalation of antibiotics        strongly encouraged.   CBC     Status: Abnormal   Collection Time: 06/16/15  6:44 AM  Result Value Ref Range   WBC 11.3 (H) 4.0 - 10.5 K/uL   RBC 2.82 (L) 4.22 - 5.81 MIL/uL   Hemoglobin 7.5 (L) 13.0 - 17.0 g/dL   HCT 24.6 (L) 39.0 - 52.0 %   MCV 87.2 78.0 - 100.0 fL   MCH 26.6 26.0 - 34.0 pg   MCHC 30.5 30.0 - 36.0 g/dL   RDW 19.9 (H) 11.5 - 15.5 %   Platelets 175 150 - 400 K/uL  Basic metabolic panel     Status: Abnormal   Collection Time: 06/16/15  6:44 AM  Result Value Ref Range   Sodium 137 135 - 145 mmol/L   Potassium 4.8 3.5 - 5.1 mmol/L   Chloride 104 101 - 111 mmol/L   CO2 23 22 - 32 mmol/L   Glucose, Bld 176 (H) 65 - 99 mg/dL   BUN 17 6 - 20 mg/dL   Creatinine, Ser 1.41 (H) 0.61 - 1.24 mg/dL   Calcium 9.3 8.9 - 10.3 mg/dL   GFR calc non Af Amer 47 (L) >60 mL/min   GFR calc Af Amer 55 (L) >60 mL/min    Comment: (NOTE) The eGFR has been calculated using the CKD EPI equation. This calculation has not been validated in all clinical situations. eGFR's persistently <60 mL/min signify possible Chronic Kidney Disease.    Anion gap 10 5 - 15  Glucose, capillary     Status: Abnormal   Collection Time: 06/16/15  8:05 AM  Result Value Ref Range   Glucose-Capillary 185 (H) 65 - 99 mg/dL   Comment 1 Document in Chart   Glucose, capillary     Status: Abnormal   Collection Time: 06/16/15 12:18 PM  Result  Value Ref Range   Glucose-Capillary 329 (H) 65 - 99 mg/dL   Comment 1 Document in Chart   Glucose, capillary     Status: Abnormal   Collection Time: 06/16/15  4:02 PM  Result  Value Ref Range   Glucose-Capillary 200 (H) 65 - 99 mg/dL  Glucose, capillary     Status: Abnormal   Collection Time: 06/16/15  7:55 PM  Result Value Ref Range   Glucose-Capillary 106 (H) 65 - 99 mg/dL  Glucose, capillary     Status: Abnormal   Collection Time: 06/17/15 12:06 AM  Result Value Ref Range   Glucose-Capillary 141 (H) 65 - 99 mg/dL   Comment 1 Notify RN    Comment 2 Document in Chart   Glucose, capillary     Status: Abnormal   Collection Time: 06/17/15  3:35 AM  Result Value Ref Range   Glucose-Capillary 127 (H) 65 - 99 mg/dL   Comment 1 Notify RN    Comment 2 Document in Chart   CBC     Status: Abnormal   Collection Time: 06/17/15  4:20 AM  Result Value Ref Range   WBC 9.8 4.0 - 10.5 K/uL   RBC 2.94 (L) 4.22 - 5.81 MIL/uL   Hemoglobin 7.8 (L) 13.0 - 17.0 g/dL   HCT 25.6 (L) 39.0 - 52.0 %   MCV 87.1 78.0 - 100.0 fL   MCH 26.5 26.0 - 34.0 pg   MCHC 30.5 30.0 - 36.0 g/dL   RDW 19.9 (H) 11.5 - 15.5 %   Platelets 188 150 - 400 K/uL  Basic metabolic panel     Status: Abnormal   Collection Time: 06/17/15  4:20 AM  Result Value Ref Range   Sodium 136 135 - 145 mmol/L   Potassium 4.3 3.5 - 5.1 mmol/L   Chloride 104 101 - 111 mmol/L   CO2 23 22 - 32 mmol/L   Glucose, Bld 128 (H) 65 - 99 mg/dL   BUN 23 (H) 6 - 20 mg/dL   Creatinine, Ser 1.46 (H) 0.61 - 1.24 mg/dL   Calcium 9.4 8.9 - 10.3 mg/dL   GFR calc non Af Amer 45 (L) >60 mL/min   GFR calc Af Amer 52 (L) >60 mL/min    Comment: (NOTE) The eGFR has been calculated using the CKD EPI equation. This calculation has not been validated in all clinical situations. eGFR's persistently <60 mL/min signify possible Chronic Kidney Disease.    Anion gap 9 5 - 15   Dg Chest Port 1 View  06/16/2015  CLINICAL DATA:  75 year old male with lung cancer. No chest complaint at this time. EXAM: PORTABLE CHEST 1 VIEW COMPARISON:  Chest radiograph dated 06/15/2015 FINDINGS: There has been interval removal of the endotracheal and  enteric tube. The stable area of density is again noted in the left mid lung field. Multiple surgical clips arms in this area. No new consolidation identified. Overall there is improved aeration of the left lower lung field since the prior study. The right lung is clear. There is no pleural effusion or pneumothorax. Stable cardiac silhouette. Median sternotomy wires and CABG vascular clips noted. Right pectoral Port-A-Cath with tip in stable positioning. No acute osseous pathology. IMPRESSION: Interval removal of the endotracheal and enteric tube. Stable appearing density in the left mid lung field with overall improvement of the aeration of the left lung. Electronically Signed   By: Anner Crete M.D.   On: 06/16/2015 19:54       Medical Problem  List and Plan: 1.  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 Plan to follow-up MRI of the brain after ICH absorbed for further evaluation of possible metastases posterior left frontal lobe  -begin CIR therapies 2.  DVT Prophylaxis/Anticoagulation: SCDs. Monitor for signs of DVT 3. Pain Management: Ultram and oxycodone as needed 4. Mood: Xanax 0.25 mg 3 times daily as needed 5. Neuropsych: This patient is capable of making decisions on his own behalf. 6. Skin/Wound Care: Routine skin checks 7. Fluids/Electrolytes/Nutrition: Routine I&O's with follow-up chemistries 8. Seizure prophylaxis. Keppra now at 1000 mg twice a day---more alert. EEG displayed seizure activity 9. Atrial fibrillation. Cardiac rate control. Continue amiodarone 200 mg daily, Cardizem 60 mg twice a day.Eliquis held secondary to [place] 10. 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 As noted plan follow-up MRI after ICH absorbed for further evaluation of possible metastasis 11. Diabetes mellitus with peripheral neuropathy. Hemoglobin A1c 6.8. Glucophage 1000 mg at breakfast and 500  mg supper, Amaryl 1 mg daily. Check blood sugars before meals and at bedtime 12. Hyperlipidemia. Lipitor  Post Admission Physician Evaluation: 1. Functional deficits secondary  to left frontal ICH/SAH. 2. Patient is admitted to receive collaborative, interdisciplinary care between the physiatrist, rehab nursing staff, and therapy team. 3. Patient's level of medical complexity and substantial therapy needs in context of that medical necessity cannot be provided at a lesser intensity of care such as a SNF. 4. Patient has experienced substantial functional loss from his/her baseline which was documented above under the "Functional History" and "Functional Status" headings.  Judging by the patient's diagnosis, physical exam, and functional history, the patient has potential for functional progress which will result in measurable gains while on inpatient rehab.  These gains will be of substantial and practical use upon discharge  in facilitating mobility and self-care at the household level. 5. Physiatrist will provide 24 hour management of medical needs as well as oversight of the therapy plan/treatment and provide guidance as appropriate regarding the interaction of the two. 6. 24 hour rehab nursing will assist with bladder management, bowel management, safety, skin/wound care, disease management, medication administration, pain management and patient education  and help integrate therapy concepts, techniques,education, etc. 7. PT will assess and treat for/with: Lower extremity strength, range of motion, stamina, balance, functional mobility, safety, adaptive techniques and equipment, NMR, w/c assessment, family education, ego support.   Goals are: min to mod assist. 8. OT will assess and treat for/with: ADL's, functional mobility, safety, upper extremity strength, adaptive techniques and equipment, NMR, ego support, community reintegration, education.   Goals are: min to mod assist. Therapy may proceed with  showering this patient. 9. SLP will assess and treat for/with: speech, swallowing, cognition, communication.  Goals are: supervision to min assist. 10. Case Management and Social Worker will assess and treat for psychological issues and discharge planning. 11. Team conference will be held weekly to assess progress toward goals and to determine barriers to discharge. 12. Patient will receive at least 3 hours of therapy per day at least 5 days per week. 13. ELOS: 20-25 days       14. Prognosis:  good     Meredith Staggers, MD, [facility] 06/13/2015

## 2015-06-21 NOTE — Progress Notes (Signed)
Charlett Blake, MD Physician Signed Physical Medicine and Rehabilitation Consult Note 06/16/2015 11:07 AM  Related encounter: Admission (Current) from 06/13/2015 in [place] Collapse All        Physical Medicine and Rehabilitation Consult Reason for Consult: Left frontal ICH with adjacent [facility] Referring Physician: Dr.Xu   HPI: Ivan Beasley is a 75 y.o. 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 Per chart review patient is married and independent prior to admission and driving short distances. One level home with 2 steps to entry. Presented with 06/13/2015 to [facility] with altered mental status and right-sided weakness. CT of the head revealed left frontal intracerebral hemorrhage with subarachnoid hemorrhage. Patient's eliquis was discontinued. Patient was transferred to [facility] for further evaluation. MRI of the brain showed posterior left frontal lobe complex 2.5 x 2.2 x 2 cm hemorrhagic lesion with marked surrounding vasogenic edema with breakthrough into sulci with subarachnoid hemorrhage noted. Maintained on Keppra for seizure prophylaxis. EEG mild generalized nonspecific cerebral dysfunction consistent with encephalopathy. Recent echocardiogram with ejection fraction of 50% without embolus. Lower extremity Dopplers negative. Plan to repeat MRI for further evaluation of possible metastases posterior left frontal lobe after ICH absorbed. No current plan to resume eliquis at this time. Currently on a mechanical soft thin liquid diet. Occupational therapy evaluation completed with recommendations of  physical medicine rehabilitation consult.  Patient was up with OT today. Patient tends to lean towards the right side but is able to self-correct.  Review of Systems  Constitutional: Negative for fever and chills.  HENT: Positive for hearing loss.  Eyes: Negative for blurred vision and double vision.  Respiratory: Positive for cough. Negative for shortness of breath.  Cardiovascular: Positive for palpitations and leg swelling. Negative for chest pain.  Gastrointestinal: Positive for constipation. Negative for nausea and vomiting.   GERD  Genitourinary: Positive for frequency. Negative for dysuria and hematuria.  Musculoskeletal: Positive for myalgias and back pain.  Skin: Negative for rash.  Neurological: Positive for dizziness, weakness and headaches. Negative for loss of consciousness.  All other systems reviewed and are negative.  Past Medical History  Diagnosis Date  . Coronary artery disease   . Pneumonia 2000  . Arthritis   . 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 Bruises easily   . GERD (gastroesophageal reflux disease)     takes Omeprazole daily as needed for stomach pain  . Blood transfusion 2001  . Diabetes mellitus     takes Metformin daily;  . Impaired hearing     bil hearing aide  . Macular degeneration     being watched for this but hasn't been "completely" diagnosed  . Myocardial infarction ([place]) 2001  . Cancer [facility])    Past Surgical History  Procedure Laterality Date  . 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 Cardiac catheterization  2001  . Tonsillectomy      as a child  . Colonoscopy    . Cataract extraction      bilateral  . Lumbar laminectomy/decompression microdiscectomy  12/19/2010    Procedure: LUMBAR LAMINECTOMY/DECOMPRESSION MICRODISCECTOMY; Surgeon: Elaina Hoops;  Location: [facility]; Service: Neurosurgery; Laterality:  N/A; Lumbar three-four, four-five decompressive lumbar laminectomy  . Joint replacement  right tkr  . Eye surgery      cataracts  . Back surgery      l4 5  . Portacath placement N/A 05/20/2014    Procedure: INSERTION PORT-A-CATH; Surgeon: Nestor Lewandowsky, MD; Location: [facility] ORS; Service: General; Laterality: N/A;   Family History  Problem Relation Age of Onset  . Anesthesia problems Neg Hx   . Hypotension Neg Hx   . Malignant hyperthermia Neg Hx   . Pseudochol deficiency Neg Hx   . Diabetes type II Other    Social History:  reports that he quit smoking about 16 years ago. His smoking use included Cigarettes. He quit after 40 years of use. He has never used smokeless tobacco. He reports that he does not drink alcohol or use illicit drugs. Allergies: No Known Allergies Medications Prior to Admission  Medication Sig Dispense Refill  . albuterol (PROVENTIL) (2.5 MG/3ML) 0.083% nebulizer solution Take 3 mLs (2.5 mg total) by nebulization every 6 (six) hours as needed for wheezing or shortness of breath. 75 mL 2  . ALPRAZolam (XANAX) 0.25 MG tablet Take 1 tablet (0.25 mg total) by mouth 3 (three) times daily as needed for anxiety. 20 tablet 0  . amiodarone (PACERONE) 200 MG tablet Take 1 tablet (200 mg total) by mouth daily. 30 tablet 0  . amoxicillin-clavulanate (AUGMENTIN) 875-125 MG tablet Take 1 tablet by mouth 2 (two) times daily. 20 tablet 0  . apixaban (ELIQUIS) 5 MG TABS tablet Take 1 tablet (5 mg total) by mouth 2 (two) times daily. 60 tablet 0  . benzonatate (TESSALON) 100 MG capsule TAKE 1 CAPSULE (100 MG TOTAL) BY MOUTH 3 (THREE) TIMES DAILY AS NEEDED FOR COUGH. 90 capsule 3  . bismuth subsalicylate (KAOPECTATE) 262 MG/15ML suspension Take 30 mLs by mouth 3 (three) times daily as needed. 360 mL 0  . Blood Glucose Calibration (TRUETEST CONTROL LEVEL 1) LIQD Use as directed. Reported on  04/17/2015    . diltiazem (CARDIZEM) 60 MG tablet Take 60 mg by mouth 2 (two) times daily.  6  . diphenoxylate-atropine (LOMOTIL) 2.5-0.025 MG tablet TAKE 1 TABLET EVERY 4 HOURS AS NEEDED FOR DIARRHEA /LOOSE STOOL 45 tablet 1  . glimepiride (AMARYL) 1 MG tablet Take 1 mg by mouth daily.     Marland Kitchen glucose blood test strip Use as directed.    Elmore Guise Devices (TRUEDRAW LANCING DEVICE) MISC Use as directed.    . lidocaine-prilocaine (EMLA) cream Apply 1 application topically See admin instructions. 1 application to site area topically as directed.    . magnesium oxide (MAG-OX) 400 MG tablet Take 400 mg by mouth daily.     . Melatonin 5 MG CAPS Take 1 capsule (5 mg total) by mouth at bedtime as needed.  0  . metFORMIN (GLUCOPHAGE) 500 MG tablet Take 500-1,000 mg by mouth See admin instructions. Take 1000 mg by mouth in the morning,and take 500 mg by mouth every night at bedtime.    Marland Kitchen omeprazole (PRILOSEC) 40 MG capsule Take 40 mg by mouth daily.     Marland Kitchen oxyCODONE (ROXICODONE) 5 MG immediate release tablet Take 1 tablet (5 mg total) by mouth every 6 (six) hours as needed for severe pain. 30 tablet 0  . predniSONE (DELTASONE) 10 MG tablet Take 6 tablets (60 mg total) by mouth daily with breakfast. Taper by '10mg'$  every day until complete 21 tablet 0  .  SYMBICORT 160-4.5 MCG/ACT inhaler Inhale 2 puffs into the lungs 2 (two) times daily.   12  . tiotropium (SPIRIVA) 18 MCG inhalation capsule Place 1 capsule (18 mcg total) into inhaler and inhale daily. 30 capsule 0  . traMADol (ULTRAM) 50 MG tablet Take 50 mg by mouth every 6 (six) hours as needed for moderate pain or severe pain.       Home: Home Living Family/patient expects to be discharged to:: Private residence Living Arrangements: Spouse/significant other Available Help at Discharge: Family Type of Home: House Home Access: Stairs to enter Technical brewer of Steps:  2 [place]: One level Bathroom Shower/Tub: Chiropodist: [place]: None Lives With: Spouse  Functional History: Prior Function Level of Independence: Independent Functional Status:  Mobility: Bed Mobility Overal bed mobility: Needs Assistance, +2 for physical assistance Bed Mobility: Rolling, Sidelying to Sit Rolling: Max assist Sidelying to sit: Total assist, +2 for physical assistance General bed mobility comments: Pt able to reach for bed rail with L hand to assist with rolling. Pt able to advance L leg to EOB but required total assist for R LE, bringing LEs off EOB and at trunk for coming to upright position. Transfers General transfer comment: Not assessed at this time.      ADL: ADL Overall ADL's : Needs assistance/impaired Eating/Feeding: Maximal assistance, Sitting Eating/Feeding Details (indicate cue type and reason): Pt attempted to self feed with L hand; able to take small bites x3 then requesting wife to assist.  Grooming: Maximal assistance, Bed level Upper Body Bathing: Maximal assistance, Sitting Lower Body Bathing: Total assistance, Bed level Upper Body Dressing : Maximal assistance, Sitting Lower Body Dressing: Total assistance, Bed level General ADL Comments: Pt with decreased trunk control and unable to sit EOB without total assist for support and balance. Did not attempt transfers at this time. Pt tearful at times when discussing OT plan of care and d/c recommendations.  Cognition: Cognition Overall Cognitive Status: Impaired/Different from baseline (to be further assessed) Arousal/Alertness: Awake/alert Orientation Level: Oriented X4 Attention: Sustained Sustained Attention: Impaired Sustained Attention Impairment: Functional basic Memory: ( recalled 2 items after 60 sec, needs further testing) Awareness: Impaired Awareness Impairment: Intellectual impairment, Emergent impairment, Anticipatory  impairment Problem Solving: Impaired Problem Solving Impairment: Verbal basic, Functional basic Safety/Judgment: Impaired Cognition Arousal/Alertness: Awake/alert Behavior During Therapy: Flat affect (Tearful at times) Overall Cognitive Status: Impaired/Different from baseline (to be further assessed)  Blood pressure 110/55, pulse 95, temperature 98.1 F (36.7 C), temperature source Oral, resp. rate 20, height 6' (1.829 m), weight 94.5 kg (208 lb 5.4 oz), SpO2 99 %. Physical Exam  Vitals reviewed. HENT:  Head: Normocephalic.  Eyes: EOM are normal.  Neck: Normal range of motion. Neck supple. No thyromegaly present.  Cardiovascular:  Cardiac rate controlled  Respiratory: Effort normal and breath sounds normal.  GI: Soft. Bowel sounds are normal. He exhibits no distension.  Neurological: He is alert.  Mood is flat but appropriate. He is oriented to person place as well as H date of birth. Follow simple commands  Skin: Skin is warm and dry.  2 minus right finger flexors otherwise 0/5 in the right upper extremity at the deltoid and bicep tricep and finger extensors. 0/5 in the right hip flexors and knee extensors ankle dorsiflexor and plantar flexor. 2 minus in the left hip adductor 5/5 in the left deltoid, biceps, triceps, grip, hip flexor, knee extensor, ankle dorsiflexor Sensation is absent to light touch in the right upper and right  lower limb. He does have some grimace to finger pinch on the right side.   Lab Results Last 24 Hours    Results for orders placed or performed during the hospital encounter of 06/13/15 (from the past 24 hour(s))  Glucose, capillary Status: Abnormal   Collection Time: 06/15/15 11:16 AM  Result Value Ref Range   Glucose-Capillary 193 (H) 65 - 99 mg/dL   Comment 1 Notify RN    Comment 2 Document in Chart   Glucose, capillary Status: Abnormal   Collection Time: 06/15/15 3:37 PM  Result Value Ref Range    Glucose-Capillary 260 (H) 65 - 99 mg/dL   Comment 1 Notify RN    Comment 2 Document in Chart   Glucose, capillary Status: Abnormal   Collection Time: 06/15/15 7:30 PM  Result Value Ref Range   Glucose-Capillary 260 (H) 65 - 99 mg/dL  Glucose, capillary Status: Abnormal   Collection Time: 06/15/15 11:07 PM  Result Value Ref Range   Glucose-Capillary 207 (H) 65 - 99 mg/dL  Glucose, capillary Status: Abnormal   Collection Time: 06/16/15 3:25 AM  Result Value Ref Range   Glucose-Capillary 136 (H) 65 - 99 mg/dL  Procalcitonin Status: None   Collection Time: 06/16/15 5:11 AM  Result Value Ref Range   Procalcitonin 0.12 ng/mL  CBC Status: Abnormal   Collection Time: 06/16/15 6:44 AM  Result Value Ref Range   WBC 11.3 (H) 4.0 - 10.5 K/uL   RBC 2.82 (L) 4.22 - 5.81 MIL/uL   Hemoglobin 7.5 (L) 13.0 - 17.0 g/dL   HCT 24.6 (L) 39.0 - 52.0 %   MCV 87.2 78.0 - 100.0 fL   MCH 26.6 26.0 - 34.0 pg   MCHC 30.5 30.0 - 36.0 g/dL   RDW 19.9 (H) 11.5 - 15.5 %   Platelets 175 150 - 400 K/uL  Basic metabolic panel Status: Abnormal   Collection Time: 06/16/15 6:44 AM  Result Value Ref Range   Sodium 137 135 - 145 mmol/L   Potassium 4.8 3.5 - 5.1 mmol/L   Chloride 104 101 - 111 mmol/L   CO2 23 22 - 32 mmol/L   Glucose, Bld 176 (H) 65 - 99 mg/dL   BUN 17 6 - 20 mg/dL   Creatinine, Ser 1.41 (H) 0.61 - 1.24 mg/dL   Calcium 9.3 8.9 - 10.3 mg/dL   GFR calc non Af Amer 47 (L) >60 mL/min   GFR calc Af Amer 55 (L) >60 mL/min   Anion gap 10 5 - 15  Glucose, capillary Status: Abnormal   Collection Time: 06/16/15 8:05 AM  Result Value Ref Range   Glucose-Capillary 185 (H) 65 - 99 mg/dL   Comment 1 Document in Chart       Imaging Results (Last 48 hours)    Mr Virgel Paling Wo Contrast  06/15/2015 ADDENDUM REPORT:  06/15/2015 08:11 ADDENDUM: With the rounded contour of the enhancing component of the left frontal lobe hematoma, follow-up until clearance recommended to exclude underlying vascular abnormality. Electronically Signed By: Genia Del M.D. On: 06/15/2015 08:11  06/15/2015 CLINICAL DATA: 75 year old diabetic male with history of lung cancer. Right-sided weakness. Subsequent encounter. EXAM: MRI HEAD WITHOUT CONTRAST MRA HEAD WITHOUT CONTRAST TECHNIQUE: Multiplanar, multiecho pulse sequences of the brain and surrounding structures were obtained without intravenous contrast. Angiographic images of the head were obtained using MRA technique without contrast. COMPARISON: 06/13/2015 head CT. 07/28/2014 brain MR. FINDINGS: MRI HEAD FINDINGS Posterior left frontal lobe complex 2.5 x 2.2 x 2 cm hemorrhagic lesion  with marked surrounding vasogenic edema with breakthrough into sulci with subarachnoid hemorrhage noted. Patient's history of lung cancer in addition to 6 mm rounded area of enhanced along the posterior margin of this hemorrhage suggests that this is most likely is related to a hemorrhagic metastatic lesion which has bled. Continued MR surveillance as hemorrhage clears is recommended. There may be a a second enhancing lesion within the anterior left parietal lobe with surrounding vasogenic edema. Venous infarct can have a similar appearance. The major dural sinuses appear patent. No acute thrombotic infarct separate from the above described findings. Remote small left parietal lobe, left frontal lobe, posterior left opercular and tiny right thalamic infarct. Mild small vessel disease changes. Moderate global atrophy without hydrocephalus. Opacification left sphenoid sinus with air-fluid level suggesting acute sinusitis. Mild mucosal thickening ethmoid sinus air cells and minimal mucosal thickening frontal sinuses. Bilateral mastoid air cell and middle ear opacification greater on the left without  obstructing lesion of the eustachian tube noted. Post lens replacement without acute orbital abnormality noted. MRA HEAD FINDINGS MR angiogram does not incorporate the left frontal lobe hemorrhage. Mild narrowing supraclinoid aspect internal carotid artery bilaterally with irregularity more notable on the left. Small infundibulum on the left posterior communicating artery level. Anterior circulation without medium or large size vessel significant stenosis or occlusion. Middle cerebral artery branch vessel irregularity bilaterally. Right vertebral artery ends in a posterior inferior cerebellar artery distribution. No significant narrowing left vertebral artery. Moderate narrowing portions of the left posterior inferior cerebellar artery. Ectatic basilar artery without high-grade stenosis. Nonvisualized anterior inferior cerebellar arteries. Small left superior cerebellar artery. Posterior cerebral artery distal branch vessel narrowing bilaterally. IMPRESSION: MRI HEAD Posterior left frontal lobe complex 2.5 x 2.2 x 2 cm hemorrhagic lesion with marked surrounding vasogenic edema with breakthrough into sulci with subarachnoid hemorrhage noted. Patient's history of lung cancer in addition to 6 mm rounded area of enhanced along the posterior margin of this hemorrhage suggests that this is most likely is related to a hemorrhagic metastatic lesion which has bled. Continued MR surveillance as hemorrhage clears is recommended. There may be a a second enhancing lesion within the anterior left parietal lobe with surrounding vasogenic edema. Venous infarct can have a similar appearance. The major dural sinuses appear patent. Opacification left sphenoid sinus with air-fluid level suggesting acute sinusitis. Bilateral mastoid air cell and middle ear opacification greater on the left without obstructing lesion of the eustachian tube noted. MRA HEAD MR angiogram does not incorporate the left frontal lobe hemorrhage. Mild narrowing  supraclinoid aspect internal carotid artery bilaterally with irregularity more notable on the left. Small infundibulum on the left posterior communicating artery level. Anterior circulation without medium or large size vessel significant stenosis or occlusion. Middle cerebral artery branch vessel irregularity bilaterally. Right vertebral artery ends in a posterior inferior cerebellar artery distribution. No significant narrowing left vertebral artery. Moderate narrowing portions of the left posterior inferior cerebellar artery. Ectatic basilar artery without high-grade stenosis. Nonvisualized anterior inferior cerebellar arteries. Small left superior cerebellar artery. Posterior cerebral artery distal branch vessel narrowing bilaterally. Electronically Signed: By: Genia Del M.D. On: 06/14/2015 19:34   [name] BD Contrast  06/15/2015 ADDENDUM REPORT: 06/15/2015 08:11 ADDENDUM: With the rounded contour of the enhancing component of the left frontal lobe hematoma, follow-up until clearance recommended to exclude underlying vascular abnormality. Electronically Signed By: Genia Del M.D. On: 06/15/2015 08:11  06/15/2015 CLINICAL DATA: 75 year old diabetic male with history of lung cancer. Right-sided weakness. Subsequent encounter. EXAM: MRI HEAD  WITHOUT CONTRAST MRA HEAD WITHOUT CONTRAST TECHNIQUE: Multiplanar, multiecho pulse sequences of the brain and surrounding structures were obtained without intravenous contrast. Angiographic images of the head were obtained using MRA technique without contrast. COMPARISON: 06/13/2015 head CT. 07/28/2014 brain MR. FINDINGS: MRI HEAD FINDINGS Posterior left frontal lobe complex 2.5 x 2.2 x 2 cm hemorrhagic lesion with marked surrounding vasogenic edema with breakthrough into sulci with subarachnoid hemorrhage noted. Patient's history of lung cancer in addition to 6 mm rounded area of enhanced along the posterior margin of this hemorrhage suggests that this is  most likely is related to a hemorrhagic metastatic lesion which has bled. Continued MR surveillance as hemorrhage clears is recommended. There may be a a second enhancing lesion within the anterior left parietal lobe with surrounding vasogenic edema. Venous infarct can have a similar appearance. The major dural sinuses appear patent. No acute thrombotic infarct separate from the above described findings. Remote small left parietal lobe, left frontal lobe, posterior left opercular and tiny right thalamic infarct. Mild small vessel disease changes. Moderate global atrophy without hydrocephalus. Opacification left sphenoid sinus with air-fluid level suggesting acute sinusitis. Mild mucosal thickening ethmoid sinus air cells and minimal mucosal thickening frontal sinuses. Bilateral mastoid air cell and middle ear opacification greater on the left without obstructing lesion of the eustachian tube noted. Post lens replacement without acute orbital abnormality noted. MRA HEAD FINDINGS MR angiogram does not incorporate the left frontal lobe hemorrhage. Mild narrowing supraclinoid aspect internal carotid artery bilaterally with irregularity more notable on the left. Small infundibulum on the left posterior communicating artery level. Anterior circulation without medium or large size vessel significant stenosis or occlusion. Middle cerebral artery branch vessel irregularity bilaterally. Right vertebral artery ends in a posterior inferior cerebellar artery distribution. No significant narrowing left vertebral artery. Moderate narrowing portions of the left posterior inferior cerebellar artery. Ectatic basilar artery without high-grade stenosis. Nonvisualized anterior inferior cerebellar arteries. Small left superior cerebellar artery. Posterior cerebral artery distal branch vessel narrowing bilaterally. IMPRESSION: MRI HEAD Posterior left frontal lobe complex 2.5 x 2.2 x 2 cm hemorrhagic lesion with marked surrounding  vasogenic edema with breakthrough into sulci with subarachnoid hemorrhage noted. Patient's history of lung cancer in addition to 6 mm rounded area of enhanced along the posterior margin of this hemorrhage suggests that this is most likely is related to a hemorrhagic metastatic lesion which has bled. Continued MR surveillance as hemorrhage clears is recommended. There may be a a second enhancing lesion within the anterior left parietal lobe with surrounding vasogenic edema. Venous infarct can have a similar appearance. The major dural sinuses appear patent. Opacification left sphenoid sinus with air-fluid level suggesting acute sinusitis. Bilateral mastoid air cell and middle ear opacification greater on the left without obstructing lesion of the eustachian tube noted. MRA HEAD MR angiogram does not incorporate the left frontal lobe hemorrhage. Mild narrowing supraclinoid aspect internal carotid artery bilaterally with irregularity more notable on the left. Small infundibulum on the left posterior communicating artery level. Anterior circulation without medium or large size vessel significant stenosis or occlusion. Middle cerebral artery branch vessel irregularity bilaterally. Right vertebral artery ends in a posterior inferior cerebellar artery distribution. No significant narrowing left vertebral artery. Moderate narrowing portions of the left posterior inferior cerebellar artery. Ectatic basilar artery without high-grade stenosis. Nonvisualized anterior inferior cerebellar arteries. Small left superior cerebellar artery. Posterior cerebral artery distal branch vessel narrowing bilaterally. Electronically Signed: By: Genia Del M.D. On: 06/14/2015 19:34   Dg Chest [facility]  06/15/2015 CLINICAL DATA: Acute respiratory failure EXAM: PORTABLE CHEST 1 VIEW COMPARISON: Portable chest x-ray of 06/14/2015 FINDINGS: Aeration of the lungs has improved somewhat. The tip of the endotracheal tube appears to be  approximately 3.8 cm above the carina. Opacity in the left perihilar and left mid lung is unchanged. Cardiomegaly is stable. Right sided Port-A-Cath is unchanged in position, and right IJ central venous line is unchanged. IMPRESSION: Slightly better aeration. Endotracheal tube tip 3.8 cm above the carina. Electronically Signed By: Ivar Drape M.D. 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 Does the need for close, 24 hr/day medical supervision in concert with the patient's rehab needs make it unreasonable for this patient to be served in a less intensive setting? Yes 2. Co-Morbidities requiring supervision/potential complications: diabetes, coronary artery disease status post coronary artery bypass graft, atrial fibrillation, was on L Quist stopped due to bleed 3. Due to bladder management, bowel management, safety, skin/wound care, disease management, medication administration, pain management and patient education, does the patient require 24 hr/day rehab nursing? Yes 4. Does the patient require coordinated care of a physician, rehab nurse, PT (1-2 hrs/day, 5 days/week), OT (1-2 hrs/day, 5 days/week) and SLP (.5-1 hrs/day, 5 days/week) to address physical and functional deficits in the context of the above medical diagnosis(es)? Yes Addressing deficits in the following areas: balance, endurance, locomotion, strength, transferring, bowel/bladder control, bathing, dressing, feeding, grooming, toileting, cognition and psychosocial support 5. Can the patient actively participate in an intensive therapy program of at least 3 hrs of therapy per day at least 5 days per week? Yes 6. The potential for patient to make measurable gains while on inpatient rehab is excellent 7. Anticipated functional outcomes upon discharge from inpatient rehab are min assist with PT, min assist with OT, min assist with SLP. 8. Estimated rehab  length of stay to reach the above functional goals is: 18-21d 9. Does the patient have adequate social supports and living environment to accommodate these discharge functional goals? Yes 10. Anticipated D/C setting: Home 11. Anticipated post D/C treatments: [place] therapy 12. Overall Rehab/Functional Prognosis: good  RECOMMENDATIONS: This patient's condition is appropriate for continued rehabilitative care in the following setting: CIR Patient has agreed to participate in recommended program. Yes Note that insurance prior authorization may be required for reimbursement for recommended care.  Comment: May initially need 15/7 schedule    06/16/2015       Revision History     Date/Time User Provider Type Action   06/16/2015 1:55 PM Charlett Blake, MD Physician Sign   06/16/2015 11:27 AM Cathlyn Parsons, PA-C Physician Assistant Pend   View Details Report       Routing History     Date/Time From To Method   06/16/2015 1:55 PM Charlett Blake, MD Albina Billet, MD

## 2015-06-21 NOTE — Interval H&P Note (Signed)
Ivan Beasley was admitted today to Inpatient Rehabilitation with the diagnosis of ICH.  The patient's history has been reviewed, patient examined, and there is no change in status.  Patient continues to be appropriate for intensive inpatient rehabilitation.  I have reviewed the patient's chart and labs.  Questions were answered to the patient's satisfaction. The PAPE has been reviewed and assessment remains appropriate.  Dimitry Holsworth T 06/25/2015, 5:17 PM

## 2015-06-21 NOTE — Care Management Important Message (Signed)
Important Message  Patient Details  Name: Ivan Beasley MRN: [id number] Date of Birth: 1940/02/02   Medicare Important Message Given:  Yes    Loann Quill 07/08/2015, 1:54 PM

## 2015-06-21 NOTE — Progress Notes (Signed)
   06/18/2015 1400  OT Visit Information  Last OT Received On 06/10/2015  Assistance Needed +2  PT/OT/SLP Co-Evaluation/Treatment Yes  Reason for Co-Treatment Complexity of the patient's impairments (multi-system involvement);For patient/therapist safety  OT goals addressed during session Strengthening/ROM  History of Present Illness 75 y.o. male who presented on 6/4 with right-sided hemi-plegia. PMHx: Metastatic lung cancer, Recent pneumonia, CAD, Arithritis, Back pain, R TKA, GERD, DM, Impaired hearing, Macular degeneration, MI. CT on 6/4 + for left frontal intracerebral hemorrhage, with subarachnoid hemorrhage as well adjacent to it. MRI on 6/6 showed posterior L frontal lobe hemorrhage w/ marked surrounding vasogenic edema with SAH, concern for metastatic lesion posterior margin of hmg and anterior L parietal lobe.    Precautions  Precautions Fall  Pain Assessment  Pain Assessment Faces  Faces Pain Scale 6  Pain Location buttocks  Pain Descriptors / Indicators (feels like blisters)  Pain Intervention(s) Monitored during session;Repositioned;Premedicated before session (buttocks cleaned, barrier cream applied)  Cognition  Arousal/Alertness Awake/alert  Behavior During Therapy Flat affect  Overall Cognitive Status Impaired/Different from baseline  Area of Impairment Following commands;Awareness;Safety/judgement  Current Attention Level Selective  Following Commands Follows one step commands consistently  ADL  Eating/Feeding Sitting;Minimal assistance  Eating/Feeding Details (indicate cue type and reason) poor appetite, assist to set up, pt with tremor interfering  Toileting- Clothing Manipulation and Hygiene Total assistance;Bed level  Bed Mobility  Overal bed mobility Needs Assistance  Bed Mobility Rolling;Sit to Sidelying  Rolling Mod assist  Sit to sidelying Max assist;+2 for physical assistance  General bed mobility comments assist to safely lower trunk to bed and maxA to bring LEs  up onto bed, pt able to assist with rolling to the R  Transfers  Overall transfer level Needs assistance  Equipment used 2 person hand held assist  Transfers Sit to/from Stand;Stand Pivot Transfers  Sit to Stand Max assist;+2 physical assistance  Stand pivot transfers Max assist;+2 physical assistance  General transfer comment used gait belt with bed pad below hips with blocking R knee to achieve full standing x 3. pt with optimal position when PT moved to front and placed tactile cues at posterior hips to bring hips forward to achieve full upright posture  Exercises  Exercises Other exercises  Other Exercises  Other Exercises L UE FF, shoulder extension, elbow flexion and extension x 10 with level 2 theraband, wife instructed in assisting pt.  OT - End of Session  Equipment Utilized During Treatment Gait belt  Activity Tolerance Patient tolerated treatment well  Patient left in bed;with call bell/phone within reach;with bed alarm set;with family/visitor present  OT Assessment/Plan  OT Plan Discharge plan remains appropriate  OT Frequency (ACUTE ONLY) Min 3X/week  Follow Up Recommendations CIR;Supervision/Assistance - 24 hour  OT Goal Progression  Progress towards OT goals Progressing toward goals  Acute Rehab OT Goals  Patient Stated Goal get to rehab  Time For Goal Achievement 06/30/15  Potential to Achieve Goals Good  OT Time Calculation  OT Start Time (ACUTE ONLY) 1206  OT Stop Time (ACUTE ONLY) 1231  OT Time Calculation (min) 25 min  OT General Charges  $OT Visit 1 Procedure  OT Treatments  $Therapeutic Exercise 8-22 mins  07/05/2015 Nestor Lewandowsky, OTR/L Pager: [phone]

## 2015-06-21 NOTE — Progress Notes (Signed)
Pt for discharge to CIR today. Discharge orders received.  telemetry dcd. Wife  at bedside to assist with discharge. Staff transferred patient to 4W11 via bed at 1640.

## 2015-06-21 NOTE — Plan of Care (Signed)
Problem: RH BOWEL ELIMINATION Goal: RH STG MANAGE BOWEL WITH ASSISTANCE STG Manage Bowel with mod Assistance.  Outcome: Not Progressing Total assist at this time -loose incontinent x3

## 2015-06-21 NOTE — PMR Pre-admission (Signed)
PMR Admission Coordinator Pre-Admission Assessment  Patient: Ivan Beasley is an 75 y.o., male MRN: [id number] DOB: January 03, 1941 Height: 6' (182.9 cm) Weight: 94.5 kg (208 lb 5.4 oz)              Insurance Information HMO: yes    PPO:      PCP:      IPA:      80/20:      OTHER: medicare replacement policy PRIMARY: [employer]      Policy#: [id number]      Subscriber: pt CM Name: Lance Muss.      Phone#: [phone] ext [phone]     Fax#: [phone] Pre-Cert#: [id number]      Employer: retired approved for 7 days Benefits:  Phone #: [phone]     Name: 06/17/15 Eff. Date: 01/09/13     Deduct: none      Out of Pocket Max: $5900      Life Max: none CIR: $295 copay per day days 1-6 then covers 100%      SNF: no copay days 1-20; $160 copay days 21-100 Outpatient: $10 copay per visit     Co-Pay: no visit limit Home Health: 100%      Co-Pay: no visit limit DME: 80%     Co-Pay: 20% Providers: in network  SECONDARY: none  Medicaid Application Date:       Case Manager:  Disability Application Date:       Case Worker:   Emergency Facilities manager Information    Name Relation Home Work Mobile   [place] Daughter [phone]       Current Medical History  Patient Admitting Diagnosis: 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  Presented with 06/13/2015 to [facility] with altered mental status and right-sided weakness. CT of the head revealed left frontal intracerebral  hemorrhage with subarachnoid hemorrhage. Patient's eliquis was discontinued. Patient was transferred to [facility] for further evaluation. MRI of the brain showed posterior left frontal lobe complex 2.5 x 2.2 x 2 cm hemorrhagic lesion with marked surrounding vasogenic edema with breakthrough into sulci with subarachnoid hemorrhage noted. Maintained on Keppra for seizure prophylaxis. EEG mild generalized nonspecific cerebral dysfunction consistent with encephalopathy. Recent echocardiogram with ejection fraction of 50% without embolus. Lower extremity Dopplers negative. Plan to repeat MRI for further evaluation of possible metastases posterior left frontal lobe after ICH absorbed. No current plan to resume eliquis at this time. Currently on a mechanical soft thin liquid diet.    Total: 9 NIH    Past Medical History  Past Medical History  Diagnosis Date  . Coronary artery disease   . Pneumonia 2000  . Arthritis   . 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 Bruises easily   . GERD (gastroesophageal reflux disease)     takes Omeprazole daily as needed for stomach pain  . Blood transfusion 2001  . Diabetes mellitus     takes Metformin daily;  . Impaired hearing     bil hearing aide  . Macular degeneration  being watched for this but hasn't been "completely" diagnosed  . Myocardial infarction ([place]) 2001  . Cancer [facility])     Family History  family history includes Diabetes type II in his other. There is no history of Anesthesia problems, Hypotension, Malignant hyperthermia, or Pseudochol deficiency.  Prior Rehab/Hospitalizations:  Has the patient had major surgery during 100 days prior to admission? No  Current Medications   Current facility-administered medications:  .  acetaminophen (TYLENOL) tablet 650 mg, 650 mg, Oral, Q4H PRN, 650 mg at 06/27/2015 0832 **OR** acetaminophen (TYLENOL) suppository 650 mg, 650 mg, Rectal, Q4H PRN, Ram Fuller Mandril, MD .   albuterol (PROVENTIL) (2.5 MG/3ML) 0.083% nebulizer solution 2.5 mg, 2.5 mg, Nebulization, Q6H PRN, Rosalin Hawking, MD .  ALPRAZolam Duanne Moron) tablet 0.25 mg, 0.25 mg, Oral, TID PRN, Rosalin Hawking, MD, 0.25 mg at 06/17/15 2024 .  amiodarone (PACERONE) tablet 200 mg, 200 mg, Oral, Daily, Ram Fuller Mandril, MD, 200 mg at 06/28/2015 1042 .  antiseptic oral rinse (CPC / CETYLPYRIDINIUM CHLORIDE 0.05%) solution 7 mL, 7 mL, Mouth Rinse, BID, Rosalin Hawking, MD, 7 mL at 06/20/15 2032 .  atorvastatin (LIPITOR) tablet 40 mg, 40 mg, Oral, q1800, Rosalin Hawking, MD, 40 mg at 06/20/15 1854 .  chlorpheniramine-HYDROcodone (TUSSIONEX) 10-8 MG/5ML suspension 5 mL, 5 mL, Oral, Q12H PRN, Grace Bushy Minor, NP, 5 mL at 06/20/15 2031 .  diltiazem (CARDIZEM) tablet 60 mg, 60 mg, Oral, BID, Rosalin Hawking, MD, 60 mg at 06/22/2015 1042 .  feeding supplement (ENSURE ENLIVE) (ENSURE ENLIVE) liquid 237 mL, 237 mL, Oral, TID PRN, Garvin Fila, MD .  glimepiride (AMARYL) tablet 1 mg, 1 mg, Oral, QAC breakfast, Rosalin Hawking, MD, 1 mg at 07/07/2015 0828 .  insulin aspart (novoLOG) injection 0-24 Units, 0-24 Units, Subcutaneous, Q4H, Ram Fuller Mandril, MD, 2 Units at 07/01/2015 0029 .  labetalol (NORMODYNE,TRANDATE) injection 10-40 mg, 10-40 mg, Intravenous, Q10 min PRN, Ram Fuller Mandril, MD .  lactose free nutrition (BOOST PLUS) liquid 237 mL, 237 mL, Oral, TID WC, Collene Gobble, MD, 237 mL at 07/01/2015 1042 .  levETIRAcetam (KEPPRA) tablet 1,000 mg, 1,000 mg, Oral, BID, Catha Gosselin, MD, 1,000 mg at 06/27/2015 1042 .  metFORMIN (GLUCOPHAGE) tablet 1,000 mg, 1,000 mg, Oral, Q breakfast, Rosalin Hawking, MD, 1,000 mg at 06/16/2015 0828 .  metFORMIN (GLUCOPHAGE) tablet 500 mg, 500 mg, Oral, Q supper, Rosalin Hawking, MD, 500 mg at 06/20/15 1854 .  oxyCODONE (Oxy IR/ROXICODONE) immediate release tablet 5 mg, 5 mg, Oral, Q6H PRN, Donzetta Starch, NP, 5 mg at 06/29/2015 1154 .  pantoprazole (PROTONIX) EC tablet 40 mg, 40 mg, Oral, Daily, Rosalin Hawking, MD, 40 mg at 07/06/2015 1042 .  senna-docusate (Senokot-S) tablet 1 tablet, 1 tablet, Oral, BID, Ram Fuller Mandril, MD, 1 tablet at 07/05/2015 1042 .  traMADol (ULTRAM) tablet 50 mg, 50 mg, Oral, Q6H PRN, Donzetta Starch, NP  Facility-Administered Medications Ordered in Other Encounters:  .  0.9 %  sodium chloride infusion, , Intravenous, Continuous, Forest Gleason, MD, Stopped at 12/11/14 1034 .  lidocaine-prilocaine (EMLA) cream, , Topical, Once, Evlyn Kanner, NP .  sodium chloride 0.9 % injection 10 mL, 10 mL, Intracatheter, PRN, Forest Gleason, MD, 10 mL at 06/03/14 1106 .  sodium chloride 0.9 % injection 10 mL, 10 mL, Intravenous, PRN, Forest Gleason, MD, 10 mL at 07/30/14 0920 .  sodium chloride 0.9 % injection 10 mL, 10 mL, Intracatheter, PRN, Forest Gleason, MD, 10 mL at  12/11/14 0912 .  sodium chloride flush (NS) 0.9 % injection 10 mL, 10 mL, Intravenous, PRN, Forest Gleason, MD, 10 mL at 05/05/15 0848  Patients Current Diet: Diet heart healthy/carb modified Room service appropriate?: Yes; Fluid consistency:: Thin  Precautions / Restrictions Precautions Precautions: Fall Precaution Comments: R hemi, pt on home O2 PTA for recent pneumonia (but used it PRN, not 24/7) Restrictions Weight Bearing Restrictions: No   Has the patient had 2 or more falls or a fall with injury in the past year?No  Prior Activity Level Community (5-7x/wk): Independent and driving pta. 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 Retired Nurse, learning disability / Paramedic Devices/Equipment: None Home Equipment: None  Prior Device Use: Indicate devices/aids used by the patient prior to current illness, exacerbation or injury? None of the above  Prior Functional Level Prior Function Level of Independence: Independent Comments: Intermittently wears a brace on both knees due to pain. Pt is s/p TKR R knee  Self Care: Did the patient need help bathing, dressing,  using the toilet or eating?  Independent  Indoor Mobility: Did the patient need assistance with walking from room to room (with or without device)? Independent  Stairs: Did the patient need assistance with internal or external stairs (with or without device)? Independent  Functional Cognition: Did the patient need help planning regular tasks such as shopping or remembering to take medications? Independent  Current Functional Level Cognition  Arousal/Alertness: Awake/alert Overall Cognitive Status: Impaired/Different from baseline Current Attention Level: Selective Orientation Level: Oriented X4 Following Commands: Follows one step commands consistently Safety/Judgement: Decreased awareness of safety, Decreased awareness of deficits General Comments: pt demo's concentration and focus to tasks at hand but con't to have R sided neglect Attention: Sustained Sustained Attention: Impaired Sustained Attention Impairment: Functional basic Memory:  ( recalled 2 items after 60  sec, needs further testing) Awareness: Impaired Awareness Impairment: Intellectual impairment, Emergent impairment, Anticipatory impairment Problem Solving: Impaired Problem Solving Impairment: Verbal basic, Functional basic Safety/Judgment: Impaired    Extremity Assessment (includes Sensation/Coordination)  Upper Extremity Assessment: Defer to OT evaluation (pt was able to flex 2-4th fingers when asked to squeeze R UE) RUE Deficits / Details: No AROM in R UE. PROM WFL. Pt/wife reports he was able to wiggle his thumb last night and earlier today. Pt reports no changes in sensation. RUE Coordination: decreased fine motor, decreased gross motor  Lower Extremity Assessment: RLE deficits/detail RLE Deficits / Details: right leg currently with dense hemipelgia.  No trace muscle activation of ankle (PF/DF), knee flex/ext, and hip flexion (ext not tested) RLE Sensation:  (pt able to localize touch)    ADLs  Overall ADL's  : Needs assistance/impaired Eating/Feeding: Sitting, Minimal assistance Eating/Feeding Details (indicate cue type and reason): poor appetite, assist to set up, pt with tremor interfering Grooming: Minimal assistance, Sitting, Wash/dry face Upper Body Bathing: Maximal assistance, Sitting Lower Body Bathing: Total assistance, Bed level Upper Body Dressing : Maximal assistance, Sitting Lower Body Dressing: Total assistance, Bed level Toileting- Clothing Manipulation and Hygiene: Total assistance, Bed level General ADL Comments: Performed PROM R UE in supine. Wife reports R UE flexion with yawning and thumb movement, but did not observe this visit. Pt tolerating activity well.     Mobility  Overal bed mobility: Needs Assistance Bed Mobility: Rolling, Sit to Sidelying Rolling: Mod assist Sidelying to sit: Max assist Supine to sit: Max assist, +2 for physical assistance Sit to sidelying: Max assist, +2 for physical  assistance General bed mobility comments: assist to safely lower trunk to bed and maxA to bring LEs up onto bed, pt able to assist with rolling to the R    Transfers  Overall transfer level: Needs assistance Equipment used: 2 person hand held assist Transfer via Lift Equipment: [employer] Transfers: Sit to/from Stand, Risk manager Sit to Stand: Max assist, +2 physical assistance Stand pivot transfers: Max assist, +2 physical assistance General transfer comment: used gait belt with bed pad below hips with blocking R knee to achieve full standing x 3. pt with optimal position when PT moved to front and placed tactile cues at posterior hips to bring hips forward to achieve full upright posture    Ambulation / Gait / Stairs / Wheelchair Mobility  Ambulation/Gait General Gait Details: unable at this time.     Posture / Balance Dynamic Sitting Balance Sitting balance - Comments: when set up in optimal position pt able to maintain midline x 15 seconds prior to leaning to the  R. Balance Overall balance assessment: Needs assistance Sitting-balance support: Feet supported Sitting balance-Leahy Scale: Poor Sitting balance - Comments: when set up in optimal position pt able to maintain midline x 15 seconds prior to leaning to the R. Postural control: Right lateral lean Standing balance-Leahy Scale: Zero Standing balance comment: +2 max assist to maintain standing    Special needs/care consideration Continuous Drip IV yes Skin pereanal area reddened secondary to diarhea                               Bowel mgmt: inconitnent Bladder mgmt: condom catheter. Wife reports urgency Diabetic mgmt Hgb A1c 7.4 in April   Previous [place]: Spouse/significant other  Lives With: Spouse Available Help at Discharge: Family, Available 24 hours/day Type of Home: [place]: One level Home Access: Stairs to enter Entrance Stairs-Rails: None Entrance Stairs-Number of Steps: 1 Bathroom Shower/Tub: Government social research officer Accessibility: No (not sure that a WC would fit into the bathroom) [place]: No  Discharge Living Setting Plans for Discharge Living Setting: Patient's home, Lives with (comment) (spouse) Type of Home at Discharge: House Discharge Home Layout: One level Discharge Home Access: Stairs to enter Entrance Stairs-Rails: None Entrance Stairs-Number of Steps: 1 Discharge Bathroom Shower/Tub: Tub/shower unit Discharge Bathroom Toilet: Standard Discharge Bathroom Accessibility: No (not sure if w/c would fit into bathroom) Does the patient have any problems obtaining your medications?: No  Social/Family/Support Systems Patient Roles: Spouse, Parent Contact Information: Olivia Mackie, wife Anticipated Caregiver: wife and daughter Anticipated Caregiver's Contact Information: see above Caregiver Availability: 24/7 Discharge Plan Discussed with Primary Caregiver: Yes Is Caregiver In Agreement with  Plan?: Yes Does Caregiver/Family have Issues with Lodging/Transportation while Pt is in Rehab?: No  Goals/Additional Needs Patient/Family Goal for Rehab: Min assist PT, OT, and SLP Expected length of stay: ELOS 18-21 days Pt/Family Agrees to Admission and willing to participate: Yes Program Orientation Provided & Reviewed with Pt/Caregiver Including Roles  & Responsibilities: Yes  Decrease burden of Care through IP rehab admission: n/a  Possible need for SNF placement upon discharge: Wife likely open to SNF if pt does not reach level she can manage at home after CIR.  Patient Condition: This patient's medical and functional status has changed since the consult dated: 06/16/2015 in which the Rehabilitation Physician determined and documented that the patient's condition is appropriate for intensive rehabilitative care in an inpatient rehabilitation  facility. See "History of Present Illness" (above) for medical update. Functional changes are: max assist. Patient's medical and functional status update has been discussed with the Rehabilitation physician and patient remains appropriate for inpatient rehabilitation. Will admit to inpatient rehab today.  Preadmission Screen Completed By:  Cleatrice Burke, 06/13/2015 2:51 PM ______________________________________________________________________   Discussed status with Dr. Naaman Plummer on 06/25/2015 at  1450  and received telephone approval for admission today.  Admission Coordinator:  Cleatrice Burke, time 0932 Date 07/09/2015

## 2015-06-21 NOTE — Progress Notes (Signed)
Occupational Therapy Treatment Patient Details Name: Ivan Beasley MRN: [id number] DOB: [birth year] Today's Date: 06/16/2015    History of present illness 75 y.o. male who presented on 6/4 with right-sided hemi-plegia. PMHx: Metastatic lung cancer, Recent pneumonia, CAD, Arithritis, Back pain, R TKA, GERD, DM, Impaired hearing, Macular degeneration, MI. CT on 6/4 + for left frontal intracerebral hemorrhage, with subarachnoid hemorrhage as well adjacent to it. MRI on 6/6 showed posterior L frontal lobe hemorrhage w/ marked surrounding vasogenic edema with SAH, concern for metastatic lesion posterior margin of hmg and anterior L parietal lobe.     OT comments  Pt working and concentrating hard on sitting balance and trunk control in sitting and standing. Able to maintain sitting balance with min assist while engaged in grooming task at EOB.  Improving activity tolerance. Wife reporting increasing flexor tone in R UE. Pt continues to be an excellent inpatient rehab candidate. Will continue to follow.  Follow Up Recommendations  CIR;Supervision/Assistance - 24 hour    Equipment Recommendations       Recommendations for Other Services      Precautions / Restrictions Precautions Precautions: Fall Precaution Comments: R hemi, pt on home O2 PTA for recent pneumonia (but used it PRN, not 24/7) Restrictions Weight Bearing Restrictions: No       Mobility Bed Mobility Overal bed mobility: Needs Assistance Bed Mobility: Rolling;Supine to Sit;Sidelying to Sit     Supine to sit: Max assist;+2 for physical assistance     General bed mobility comments: pt able to initiated L LE out of bed, maxA for trunk elevation and to bring hips to EOB  Transfers Overall transfer level: Needs assistance Equipment used: 2 person hand held assist Transfers: Sit to/from [facility] Sit to Stand: Max assist;+2 physical assistance Stand pivot transfers: Max assist;+2 physical assistance        General transfer comment: used gait belt with bed pad below hips with blocking R knee to achieve full standing x 3. pt with optimal position when PT moved to front and placed tactile cues at posterior hips to bring hips forward to achieve full upright posture    Balance Overall balance assessment: Needs assistance Sitting-balance support: Feet supported Sitting balance-Leahy Scale: Poor Sitting balance - Comments: when set up in optimal position pt able to maintain midline x 15 seconds prior to leaning to the R. Postural control: Right lateral lean   Standing balance-Leahy Scale: Zero Standing balance comment: +2 max assist to maintain standing                   ADL Overall ADL's : Needs assistance/impaired     Grooming: Minimal assistance;Sitting;Wash/dry face                                 General ADL Comments: Performed PROM R UE in supine. Wife reports R UE flexion with yawning and thumb movement, but did not observe this visit. Pt tolerating activity well.       Vision                 Additional Comments: Pt locating visual targets in all planes with reaching activities at EOB.   Perception     Praxis      Cognition   Behavior During Therapy: Flat affect Overall Cognitive Status: Impaired/Different from baseline Area of Impairment: Following commands;Awareness;Safety/judgement   Current Attention Level: Selective    Following Commands: Follows  one step commands consistently   Awareness: Emergent Problem Solving: Slow processing General Comments: pt demo's concentration and focus to tasks at hand but con't to have R sided neglect    Extremity/Trunk Assessment               Exercises     Shoulder Instructions       General Comments      Pertinent Vitals/ Pain       Pain Assessment: No/denies pain  Home Living                                          Prior Functioning/Environment               Frequency Min 3X/week     Progress Toward Goals  OT Goals(current goals can now be found in the care plan section)  Progress towards OT goals: Progressing toward goals  Acute Rehab OT Goals Patient Stated Goal: get to rehab  Plan Discharge plan remains appropriate    Co-evaluation      Reason for Co-Treatment: For patient/therapist safety;Complexity of the patient's impairments (multi-system involvement) PT goals addressed during session: Mobility/safety with mobility        End of Session     Activity Tolerance Patient tolerated treatment well   Patient Left in chair;with call bell/phone within reach;with chair alarm set;with family/visitor present   Nurse Communication Mobility status        Time: 0350-0938 OT Time Calculation (min): 37 min  Charges: OT General Charges $OT Visit: 1 Procedure OT Treatments $Neuromuscular Re-education: 8-22 mins  Malka So 06/25/2015, 10:33 AM  [phone]

## 2015-06-21 NOTE — Progress Notes (Signed)
I have insurance approval and will admit pt to inpt rehab today. I met with wife and pt at bedside and they are in agreement. I will make the arrangements. I have notified Attending service, RN [place] and SW [phone]

## 2015-06-21 NOTE — Progress Notes (Signed)
Physical Therapy Treatment Patient Details Name: Ivan Beasley MRN: [id number] DOB: Apr 16, 1940 Today's Date: 06/23/2015    History of Present Illness 75 y.o. male who presented on 6/4 with right-sided hemi-plegia. PMHx: Metastatic lung cancer, Recent pneumonia, CAD, Arithritis, Back pain, R TKA, GERD, DM, Impaired hearing, Macular degeneration, MI. CT on 6/4 + for left frontal intracerebral hemorrhage, with subarachnoid hemorrhage as well adjacent to it. MRI on 6/6 showed posterior L frontal lobe hemorrhage w/ marked surrounding vasogenic edema with SAH, concern for metastatic lesion posterior margin of hmg and anterior L parietal lobe.      PT Comments    Pt with increased level of alertness, ability to focus and follow commands. Pt with improved ability to maintain EOB sitting balance with minimal assist for <15 sec. Pt sat EOB 12 min total to participate in ADLs with OT while PT providing NDT input to trunk. Pt with improved ability to WB on R LE during standing and was able to take a step with L LE towards the chair. Pt remains an excellent candidate for CIR upon d/c to maximize functional recovery for safe transition home with spouse.   Follow Up Recommendations  CIR     Equipment Recommendations  Wheelchair cushion (measurements PT);3in1 (PT);Wheelchair (measurements PT);Hospital bed    Recommendations for Other Services Rehab consult     Precautions / Restrictions Precautions Precautions: Fall Precaution Comments: R hemi, pt on home O2 PTA for recent pneumonia (but used it PRN, not 24/7) Restrictions Weight Bearing Restrictions: No    Mobility  Bed Mobility Overal bed mobility: Needs Assistance Bed Mobility: Rolling;Supine to Sit;Sidelying to Sit     Supine to sit: Max assist;+2 for physical assistance     General bed mobility comments: pt able to initiated L LE out of bed, maxA for trunk eelvation and to bring hips to EOB  Transfers Overall transfer level: Needs  assistance   Transfers: Sit to/from Stand;Stand Pivot Transfers Sit to Stand: Max assist;+2 physical assistance Stand pivot transfers: Max assist;+2 physical assistance       General transfer comment: used gait beld with bed pad below hips with PT blockign R knee to achieve full standing x 3. pt with optimal position when PT moved to front and placed tactile cues at posterior hips to bring hips forward to achieve full upright posture  Ambulation/Gait                 Stairs            Wheelchair Mobility    Modified Rankin (Stroke Patients Only) Modified Rankin (Stroke Patients Only) Pre-Morbid Rankin Score: No significant disability Modified Rankin: Severe disability     Balance Overall balance assessment: Needs assistance Sitting-balance support: Feet supported;Single extremity supported Sitting balance-Leahy Scale: Poor Sitting balance - Comments: when set up in optimal position pt able to maintain midline x 15 seconds prior to leaning to the R. Postural control: Right lateral lean   Standing balance-Leahy Scale: Zero Standing balance comment: +2 max assist to maintain standing                    Cognition Arousal/Alertness: Awake/alert Behavior During Therapy: Flat affect Overall Cognitive Status: Impaired/Different from baseline Area of Impairment: Following commands;Awareness;Safety/judgement   Current Attention Level: Selective   Following Commands: Follows one step commands consistently   Awareness: Emergent Problem Solving: Slow processing General Comments: pt demo's concentration and focus to tasks at hand but con't to have R sided neglect  Exercises      General Comments        Pertinent Vitals/Pain Pain Assessment: No/denies pain    Home Living                      Prior Function            PT Goals (current goals can now be found in the care plan section) Acute Rehab PT Goals Patient Stated Goal: get to  rehab Progress towards PT goals: Progressing toward goals    Frequency  Min 4X/week    PT Plan Current plan remains appropriate    Co-evaluation PT/OT/SLP Co-Evaluation/Treatment: Yes Reason for Co-Treatment: Complexity of the patient's impairments (multi-system involvement) PT goals addressed during session: Mobility/safety with mobility       End of Session Equipment Utilized During Treatment: Gait belt Activity Tolerance: Patient tolerated treatment well Patient left: in chair;with call bell/phone within reach;with chair alarm set;with family/visitor present     Time: 0921-0958 PT Time Calculation (min) (ACUTE ONLY): 37 min  Charges:  $Therapeutic Activity: 8-22 mins                    G Codes:      Kingsley Callander 06/17/2015, 10:15 AM   Kittie Plater, PT, DPT Pager #: [phone] Office #: [phone]

## 2015-06-21 NOTE — Progress Notes (Signed)
I contacted [employer] to clarify insurance decision. They are requesting updated therapy assessments from today to pursue further. I have contacted Caryl Pina with PT who will contact Almyra Free with OT to provide needed information this morning. I will follow up once insurance determination has been made. [phone]

## 2015-06-21 NOTE — Discharge Summary (Signed)
Stroke Discharge Summary  Patient ID: Ivan Beasley   MRN: [id number]      DOB: [birth year]  Date of Admission: 06/13/2015 Date of Discharge: 06/12/2015  Attending Physician:  Garvin Fila, MD, Stroke MD Consulting Physician(s):   Marshell Garfinkel, MD (pulmonary/intensive care), Alysia Penna, MD (Physical Medicine & Rehabtilitation)  Patient's PCP:  Albina Billet, MD  Discharge Diagnoses:  Principal Problem:   Seizures ([place]), possible Active Problems:   Diabetes ([place])   HLD (hyperlipidemia)   H/O coronary artery bypass surgery   Acute on chronic respiratory failure with hypoxia (HCC)   Paroxysmal atrial fibrillation ([place])   ICH (intracerebral hemorrhage) ([place])- L frontal ICH   Idiopathic hypotension   Lung cancer ([place])   Cough   SAH (subarachnoid hemorrhage) ([place])   Coagulopathy ([place]) - eliquis related   Brain metastases ([place]), possible   Asystole ([place])   Hypophosphatemia   Hypomagnesemia   CKD (chronic kidney disease), stage III   Anemia  Past Medical History  Diagnosis Date  . Coronary artery disease   . Pneumonia 2000  . Arthritis   . 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 Bruises easily   . GERD (gastroesophageal reflux disease)     takes Omeprazole daily as needed for stomach pain  . Blood transfusion 2001  . Diabetes mellitus     takes Metformin daily;  . Impaired hearing     bil hearing aide  . Macular degeneration     being watched for this but hasn't been "completely" diagnosed  . Myocardial infarction ([place]) 2001  . Cancer [facility])    Past Surgical History  Procedure Laterality Date  . 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 Cardiac catheterization  2001  . Tonsillectomy      as a child  . Colonoscopy    . Cataract extraction      bilateral  . Lumbar laminectomy/decompression microdiscectomy  12/19/2010    Procedure: LUMBAR LAMINECTOMY/DECOMPRESSION MICRODISCECTOMY;  Surgeon: Elaina Hoops;  Location: [place] [facility] ORS;   Service: Neurosurgery;  Laterality: N/A;  Lumbar three-four, four-five decompressive lumbar laminectomy  . Joint replacement  right tkr  . Eye surgery      cataracts  . Back surgery      l4 5  . Portacath placement N/A 05/20/2014    Procedure: INSERTION PORT-A-CATH;  Surgeon: Nestor Lewandowsky, MD;  Location: [facility] ORS;  Service: General;  Laterality: N/A;    Medications to be continued on Rehab . amiodarone  200 mg Oral Daily  . antiseptic oral rinse  7 mL Mouth Rinse BID  . atorvastatin  40 mg Oral q1800  . diltiazem  60 mg Oral BID  . glimepiride  1 mg Oral QAC breakfast  . insulin aspart  0-24 Units Subcutaneous Q4H  . lactose free nutrition  237 mL Oral TID WC  . levETIRAcetam  1,000 mg Oral BID  . metFORMIN  1,000 mg Oral Q breakfast  . metFORMIN  500 mg Oral Q supper  . pantoprazole  40 mg Oral Daily  . senna-docusate  1 tablet Oral BID    LABORATORY STUDIES CBC    Component Value Date/Time   WBC 6.6 06/20/2015 0224   WBC 8.0 04/30/2014 0938   RBC 2.98* 06/20/2015 0224   RBC 4.33* 04/30/2014 0938   HGB 8.0* 06/20/2015 0224   HGB 12.2* 04/30/2014 0938   HCT 25.8* 06/20/2015 0224  HCT 37.4* 04/30/2014 0938   PLT 155 06/20/2015 0224   PLT 158 04/30/2014 0938   MCV 86.6 06/20/2015 0224   MCV 86 04/30/2014 0938   MCH 26.8 06/20/2015 0224   MCH 28.1 04/30/2014 0938   MCHC 31.0 06/20/2015 0224   MCHC 32.6 04/30/2014 0938   RDW 19.2* 06/20/2015 0224   RDW 16.1* 04/30/2014 0938   LYMPHSABS 0.8 06/19/2015 1342   LYMPHSABS 1.5 04/30/2014 0938   MONOABS 0.5 06/19/2015 1342   MONOABS 1.1* 04/30/2014 0938   EOSABS 0.1 06/19/2015 1342   EOSABS 0.3 04/30/2014 0938   BASOSABS 0.0 06/19/2015 1342   BASOSABS 0.0 04/30/2014 0938   CMP    Component Value Date/Time   NA 133* 06/20/2015 1119   NA 134* 04/30/2014 0938   K 4.2 06/20/2015 1119   K 5.0 04/30/2014 0938   CL 103 06/20/2015 1119   CL 102 04/30/2014 0938   CO2 19* 06/20/2015 1119   CO2 25 04/30/2014 0938    GLUCOSE 159* 06/20/2015 1119   GLUCOSE 225* 04/30/2014 0938   BUN 29* 06/20/2015 1119   BUN 25* 04/30/2014 0938   CREATININE 1.44* 06/20/2015 1119   CREATININE 1.20 04/30/2014 0938   CALCIUM 9.5 06/20/2015 1119   CALCIUM 9.2 04/30/2014 0938   PROT 6.1* 06/19/2015 1342   PROT 7.5 04/30/2014 0938   ALBUMIN 2.8* 06/19/2015 1342   ALBUMIN 3.7 04/30/2014 0938   AST 38 06/19/2015 1342   AST 46* 04/30/2014 0938   ALT 29 06/19/2015 1342   ALT 40 04/30/2014 0938   ALKPHOS 125 06/19/2015 1342   ALKPHOS 104 04/30/2014 0938   BILITOT 0.3 06/19/2015 1342   BILITOT 0.7 04/30/2014 0938   GFRNONAA 46* 06/20/2015 1119   GFRNONAA 59* 04/30/2014 0938   GFRAA 53* 06/20/2015 1119   GFRAA >60 04/30/2014 0938   COAGS Lab Results  Component Value Date   INR 1.84 06/13/2015   INR 1.36 04/18/2015   INR 1.0 04/30/2014   Lipid Panel    Component Value Date/Time   CHOL 206* 06/14/2015 0215   TRIG 64 06/15/2015 0550   HDL 50 06/14/2015 0215   CHOLHDL 4.1 06/14/2015 0215   VLDL 11 06/14/2015 0215   LDLCALC 145* 06/14/2015 0215   HgbA1C  Lab Results  Component Value Date   HGBA1C 6.8* 06/14/2015   Urinalysis    Component Value Date/Time   COLORURINE STRAW* 06/13/2015 1305   COLORURINE Yellow 05/07/2013 1145   APPEARANCEUR CLEAR* 06/13/2015 1305   APPEARANCEUR Hazy 05/07/2013 1145   LABSPEC 1.010 06/13/2015 1305   LABSPEC 1.011 05/07/2013 1145   PHURINE 6.0 06/13/2015 1305   PHURINE 5.0 05/07/2013 1145   GLUCOSEU NEGATIVE 06/13/2015 1305   GLUCOSEU 50 mg/dL 05/07/2013 1145   HGBUR NEGATIVE 06/13/2015 1305   HGBUR Negative 05/07/2013 1145   BILIRUBINUR NEGATIVE 06/13/2015 1305   BILIRUBINUR Negative 05/07/2013 [place] 06/13/2015 1305   KETONESUR Negative 05/07/2013 1145   PROTEINUR NEGATIVE 06/13/2015 1305   PROTEINUR Negative 05/07/2013 1145   NITRITE NEGATIVE 06/13/2015 1305   NITRITE Negative 05/07/2013 1145   LEUKOCYTESUR NEGATIVE 06/13/2015 1305    LEUKOCYTESUR Negative 05/07/2013 1145    SIGNIFICANT DIAGNOSTIC STUDIES Ct Head Wo Contrast 06/13/2015  Left posterior frontal intra cerebral hemorrhage with subarachnoid extension as described above.   Dg Chest Port 1 View 06/18/2015 Stable left mid lung dense pulmonary opacity. Bibasilar atelectasis. 06/16/15 Interval removal of the endotracheal and enteric tube. Stable appearing density in the left mid lung  field with overall improvement of the aeration of the left lung. 06/15/2015 Slightly better aeration. Endotracheal tube tip 3.8 cm above the carina.  06/14/2015 Stable support apparatus. Persistent opacity left hilum and left perihilar region. Postradiation fibrosis in left upper lobe again noted. Left basilar atelectasis. No pulmonary edema. No pneumothorax. Status post median sternotomy.  06/13/2015 Endotracheal tube above the carina. Stable appearing postsurgical changes and persistent opacity in the left mid lung field.  06/13/2015 Stable to slightly increased left lung opacity which may represent posttreatment changes. No other significant change.   MRI brain w and w/o contrast  06/15/2015 With the rounded contour of the enhancing component of the left frontal lobe hematoma, follow-up until clearance recommended to exclude underlying vascular abnormality. Posterior left frontal lobe complex 2.5 x 2.2 x 2 cm hemorrhagic lesion with marked surrounding vasogenic edema with breakthrough into sulci with subarachnoid hemorrhage noted. Patient's history of lung cancer in addition to 6 mm rounded area of enhanced along the posterior margin of this hemorrhage suggests that this is most likely is related to a hemorrhagic metastatic lesion which has bled. Continued MR surveillance as hemorrhage clears is recommended. There may be a a second enhancing lesion within the anterior left parietal lobe with surrounding vasogenic edema. Venous infarct can have a similar appearance. The major dural sinuses  appear patent. Opacification left sphenoid sinus with air-fluid level suggesting acute sinusitis. Bilateral mastoid air cell and middle ear opacification greater on the left without obstructing lesion of the eustachian tube noted.   MRA head 06/15/2015 MR angiogram does not incorporate the left frontal lobe hemorrhage. Mild narrowing supraclinoid aspect internal carotid artery bilaterally with irregularity more notable on the left. Small infundibulum on the left posterior communicating artery level. Anterior circulation without medium or large size vessel significant stenosis or occlusion. Middle cerebral artery branch vessel irregularity bilaterally. Right vertebral artery ends in a posterior inferior cerebellar artery distribution. No significant narrowing left vertebral artery. Moderate narrowing portions of the left posterior inferior cerebellar artery. Ectatic basilar artery without high-grade stenosis. Nonvisualized anterior inferior cerebellar arteries. Small left superior cerebellar artery. Posterior cerebral artery distal branch vessel narrowing bilaterally.   CUS Right: 1-39% ICA stenosis. Left: 1-39% ICA stenosis, however, higher velocities may be obscured by significant calcific plaque at the origin of the left ICA. Bilateral vertebral artery flow is antegrade.   2D Echocardiogram  - Procedure narrative: Transthoracic echocardiography. Image quality was poor. The study was technically difficult. - Left ventricle: The cavity size was normal. Systolic function was mildly reduced. The estimated ejection fraction was in the range of 45% to 50%. Wall motion was normal; there were no regional wall motion abnormalities. - Aortic valve: Valve area (Vmax): 2.82 cm^2. - Left atrium: The atrium was mildly dilated. - Right ventricle: The cavity size was mildly dilated. Wall thickness was normal. - Right atrium: The atrium was mildly dilated. - Pericardium, extracardiac: Features were not consistent  with tamponade physiology. Impressions: There was no evidence of a vegetation.  LE venous doppler - negative for DVT  EEG - EEG Abnormalities: 1) mild generalized irregular slow activity Clinical Interpretation: This EEG is consistent with a mild generalized non-specific cerebral dysfunction(encephalopathy). This can be seen with sedative medications among other causes. There was no seizure or seizure predisposition recorded on this study. Please note that a normal EEG does not preclude the possibility of epilepsy. 6  2D Echocardiogram  - Procedure narrative: Transthoracic echocardiography. Image quality was poor. The study was technically difficult. -  Left ventricle: The cavity size was normal. Systolic function was mildly reduced. The estimated ejection fraction was in the range of 45% to 50%. Wall motion was normal; there were no regional wall motion abnormalities. - Aortic valve: Valve area (Vmax): 2.82 cm^2. - Left atrium: The atrium was mildly dilated. - Right ventricle: The cavity size was mildly dilated. Wall thickness was normal. - Right atrium: The atrium was mildly dilated. - Pericardium, extracardiac: Features were not consistent with tamponade physiology. Impressions: There was no evidence of a vegetation.  MRI brain w and w/o contrast  06/15/2015 With the rounded contour of the enhancing component of the left frontal lobe hematoma, follow-up until clearance recommended to exclude underlying vascular abnormality. Posterior left frontal lobe complex 2.5 x 2.2 x 2 cm hemorrhagic lesion with marked surrounding vasogenic edema with breakthrough into sulci with subarachnoid hemorrhage noted. Patient's history of lung cancer in addition to 6 mm rounded area of enhanced along the posterior margin of this hemorrhage suggests that this is most likely is related to a hemorrhagic metastatic lesion which has bled. Continued MR surveillance as hemorrhage clears is recommended. There may be a a  second enhancing lesion within the anterior left parietal lobe with surrounding vasogenic edema. Venous infarct can have a similar appearance. The major dural sinuses appear patent. Opacification left sphenoid sinus with air-fluid level suggesting acute sinusitis. Bilateral mastoid air cell and middle ear opacification greater on the left without obstructing lesion of the eustachian tube noted.   MRA head 06/15/2015 MR angiogram does not incorporate the left frontal lobe hemorrhage. Mild narrowing supraclinoid aspect internal carotid artery bilaterally with irregularity more notable on the left. Small infundibulum on the left posterior communicating artery level. Anterior circulation without medium or large size vessel significant stenosis or occlusion. Middle cerebral artery branch vessel irregularity bilaterally. Right vertebral artery ends in a posterior inferior cerebellar artery distribution. No significant narrowing left vertebral artery. Moderate narrowing portions of the left posterior inferior cerebellar artery. Ectatic basilar artery without high-grade stenosis. Nonvisualized anterior inferior cerebellar arteries. Small left superior cerebellar artery. Posterior cerebral artery distal branch vessel narrowing bilaterally.   CUS Right: 1-39% ICA stenosis. Left: 1-39% ICA stenosis, however, higher velocities may be obscured by significant calcific plaque at the origin of the left ICA. Bilateral vertebral artery flow is antegrade.   LE venous doppler - negative for DVT  EEG - EEG Abnormalities: 1) mild generalized irregular slow activity Clinical Interpretation: This EEG is consistent with a mild generalized non-specific cerebral dysfunction(encephalopathy). This can be seen with sedative medications among other causes. There was no seizure or seizure predisposition recorded on this study. Please note that a normal EEG does not preclude the possibility of epilepsy.      HISTORY OF PRESENT  ILLNESS Ivan Beasley is an 75 y.o. male patient who is transferred from [facility] for further neurological care. He went to bed last night normal at 9 PM. He has been making most of the night almost every hour per history. He remembers being normal roughly around 2 AM or so. He woke up again at 4 AM, noted right-sided hemi-plegia. (LKW 06/13/2015 at 0200) He was taken to [facility] emergency room, where a CT of the head revealed left frontal intracerebral hemorrhage, with subarachnoid hemorrhage as well adjacent to it. Patient is on eliquis for atrial fibrillation. He has a history of metastatic lung cancer, recent pneumonia. After the ER physician from [facility] discussed his case with me  for transfer, he was given [facility] for reversible. Decadron 10 mg one dose is also given as a differential includes possible to metastatic lesion given his metastatic lung cancer history. No history of seizures prior to this. Patient was not administered IV t-PA secondary to [place]. He was transferred to [facility] and admitted to the neuro ICU for further evaluation and treatment. NIHSS 6. ICH score 0.     HOSPITAL COURSE Ivan Beasley is a 75 y.o. male with history of stage III lung cancer after chemo and radiation therapy, recent PNA and atrial fibrillation on eliquis presenting with R sided hemiplegia. CT showed a L frontal ICH with adjacent SAH.  ICH: left frontal ICH with adjacent SAH while on Eliquis. MRI suggested hemorrhage secondary to lung cancer metastasis to brain in the setting of eliquis coagulopathy.   Reversed with Kcentra  Resultant right hemiplegia  MRI w/ & w/o Posterior L frontal lobe hemorrhage w/ marked surrounding vasogenic edema with SAH, concern for metastatic lesion posterior margin of hmg and anterior L parietal lobe.   Need to repeat MRI once blood all absorbed, in about 30 days  MRA Atherosclerosis, no high grade  stenosis  Carotid Doppler No significant stenosis  2D Echo from 04/2015 EF 45-50%. No source of embolus, no vegetation  LE venous doppler negative for DVT  LDL 145  HgbA1c 7.4 in April  Eliquis (apixaban) daily prior to admission, no antithrombotics after admission  Ongoing aggressive stroke risk factor management  Therapy recommendations: CIR   Disposition: CIR  Transient vs. Paroxsymal Atrial Fibrillation  Home anticoagulation: Eliquis (apixaban) daily   Stopped given hemorrhage  Found to have afib in the setting of respiratory failure  Following with Dr. Clayborn Bigness cardiology  Will defer to his outpt cardiologist Dr. 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  Developed a cough, repeat CXR neg  Possible seizure with asystole  Treated with ativan and forphenytoin  On Keppra  EEG no seizure  Ongoing lethargy led to decreased keppra dose to 1000 bid  Hyperlipidemia  Home meds: No statin  LDL 145, goal < 70  Add lipitor '40mg'$   Continue lipitor on discharge  Diabetes  HgbA1c 7.4 in April, goal < 7.0  Uncontrolled  On SSI  Put back on home meds  CBG under control  Other Stroke Risk Factors  Advanced age  Former Cigarette smoker, quit 2000  Overweight, Body mass index is 28.25 kg/(m^2).   Coronary artery disease, s/p CABG 2000, [place]  Other Active Problems  hypophosphatemia  Hypomagnesia  CKD stage  III - BUN 29 ; Creatinine - 1.44 - slight improvement.  Anemia - Hb 8.0; Hematocrit 25.8 - stable    DISCHARGE EXAM Blood pressure 116/57, pulse 88, temperature 97.9 F (36.6 C), temperature source Oral, resp. rate 18, height 6' (1.829 m), weight 94.5 kg (208 lb 5.4 oz), SpO2 93 %. General - Well nourished, well developed, not in acute distress Cardiovascular - Regular rate and rhythm. Pulm:  CTA Abd: Benign Extrem: No C/C/E  Neuro - awake alert, not in acute distress. Communicating well, follows commands, able to repeat and name with mild dysarthria. No visual field deficit, PERRL, EOMI, facial symmetrical, tongue in middle. Left UE 5/5 UE and 4/5 LE. RUE 0/5 and RLE 1/5 to pain. DTR diminished, and no babinski. Sensation symmetrical, coordination intact LUE and gait not tested.  Discharge Diet  Diet heart healthy/carb modified Room service appropriate?: Yes; Fluid consistency:: Thin liquids  DISCHARGE PLAN  Disposition:  Transfer to [place] for ongoing PT, OT and ST  No antithrombotics now for secondary stroke prevention given hemorrhage. Defer to Dr. Clayborn Bigness, cardiology, for future treatment..  Recommend ongoing risk factor control by Primary Care Physician at time of discharge from inpatient rehabilitation.  Repeat MRI after blood has resorbed to better look at [facility] etiology  Follow-up TATE,DENNY C, MD in 2 weeks following discharge from rehab.  Follow-up Dr. Clayborn Bigness, cardiologist, after rehab stay.  Follow-up with Dr. Antony Contras, Stroke Clinic in 2 months, office to schedule an appointment.   40 minutes were spent preparing discharge.  [place]ingdon Curran for Pager information 06/28/2015 4:54 PM  I have personally examined this patient, reviewed notes, independently viewed imaging studies, participated in medical decision making and plan of care. I have made any additions or clarifications directly to the above note.  Agree with note above.   Antony Contras, MD Medical Director [facility] Pager: [phone] 07/08/2015 6:52 PM

## 2015-06-21 NOTE — Telephone Encounter (Signed)
Spoke to pt's wife/ tracey. Discussed my concerns of mets to brain based on MRI; however inconclusive- recommend repat MR in 30 days for the hemorrhage to resolve. Discussed re: eval by med-onc/rad-onc at [facility] to discuss the options. She did NOT want [place] to mention the possibility of "cancer moving to brain"- as pt would be stressed. They are open to consultation with oncology at [facility].

## 2015-06-21 NOTE — Progress Notes (Signed)
Nutrition Follow-up  DOCUMENTATION CODES:   Non-severe (moderate) malnutrition in context of acute illness/injury  INTERVENTION:  Continue Boost Plus po TID between meals, each supplement provides 3600 kcal and 14 grams of protein Provide Ensure Enlive po TID PRN with meals, each supplement provides 350 kcal and 20 grams of protein   NUTRITION DIAGNOSIS:   Malnutrition (Moderate) related to acute illness as evidenced by mild depletion of body fat, moderate depletions of muscle mass.  Ongoing  GOAL:   Patient will meet greater than or equal to 90% of their needs  Unmet  MONITOR:   PO intake, Supplement acceptance, I & O's, Weight trends  REASON FOR ASSESSMENT:   Malnutrition Screening Tool    ASSESSMENT:   75 yo male dx with left lung squamous cell cancer April 2016 with subsequent chemo/rtx but developed abd lymph node enlargement treated with immunosuppressant therapy April 2017 and he reacted violently to therapy. He was on Eliquis for PAF and develop ight hemiplegia 6/4 and was found to have left frontal ICH and was transferred to [facility]. Became more somnolent > intubated 6/4 pm  Pt not very conversant at time of visit. Per pt's wife at bedside, pt was eating well last week, but for the past 2 days he has been eating less, about 50% of meals. She states he has been drinking Boost Plus TID. Lunch tray at bedside was untouched at time of visit; per wife, pt said he was not going to eat anything. Wife relates poor appetite to hip pain and frustrations with insurance. Pt agreeable to drinking (strawberry) Ensure replace missed nutrition from skipping lunch.   Labs: glucose ranging 94 to 159 mg/dL, low sodium, low hemoglobin  Diet Order:  Diet heart healthy/carb modified Room service appropriate?: Yes; Fluid consistency:: Thin  Skin:  Reviewed, no issues  Last BM:  6/10  Height:   Ht Readings from Last 1 Encounters:  06/13/15 6' (1.829 m)    Weight:   Wt Readings  from Last 1 Encounters:  06/13/15 208 lb 5.4 oz (94.5 kg)    Ideal Body Weight:  80.9 kg  BMI:  Body mass index is 28.25 kg/(m^2).  Estimated Nutritional Needs:   Kcal:  2200-2400  Protein:  115-130 grams  Fluid:  >/=2.2 L/day  EDUCATION NEEDS:   No education needs identified at this time  [place], LDN Inpatient Clinical Dietitian Pager: [phone] After Hours Pager: [phone]

## 2015-06-21 NOTE — Care Management Note (Signed)
Case Management Note  Patient Details  Name: Ivan Beasley MRN: [id number] Date of Birth: 1940/09/01  Subjective/Objective:                    Action/Plan: Pt discharging to CIR today. No further needs per CM.   Expected Discharge Date:                  Expected Discharge Plan:  [place]  In-House Referral:     Discharge planning Services  CM Consult  Post Acute Care Choice:    Choice offered to:     DME Arranged:    DME Agency:     HH Arranged:    [place] Agency:     Status of Service:  Completed, signed off  Medicare Important Message Given:  Yes Date Medicare IM Given:    Medicare IM give by:    Date Additional Medicare IM Given:    Additional Medicare Important Message give by:     If discussed at [place] of Stay Meetings, dates discussed:    Additional Comments:  Pollie Friar, RN 06/22/2015, 4:32 PM

## 2015-06-22 ENCOUNTER — Inpatient Hospital Stay (HOSPITAL_COMMUNITY): Payer: Medicare HMO | Admitting: Speech Pathology

## 2015-06-22 ENCOUNTER — Inpatient Hospital Stay (HOSPITAL_COMMUNITY): Payer: Medicare HMO | Admitting: Occupational Therapy

## 2015-06-22 ENCOUNTER — Inpatient Hospital Stay (HOSPITAL_COMMUNITY): Payer: Medicare HMO | Admitting: Physical Therapy

## 2015-06-22 DIAGNOSIS — R4701 Aphasia: Secondary | ICD-10-CM

## 2015-06-22 LAB — VAS US CAROTID
LCCADSYS: -85 cm/s
LCCAPDIAS: 16 cm/s
LEFT ECA DIAS: -8 cm/s
LEFT VERTEBRAL DIAS: -12 cm/s
Left CCA dist dias: -11 cm/s
Left CCA prox sys: 116 cm/s
Left ICA dist dias: -11 cm/s
Left ICA dist sys: -56 cm/s
Left ICA prox dias: -32 cm/s
Left ICA prox sys: -221 cm/s
RCCADSYS: -82 cm/s
RCCAPDIAS: 10 cm/s
RCCAPSYS: 107 cm/s
RIGHT ECA DIAS: -11 cm/s
RIGHT VERTEBRAL DIAS: 34 cm/s

## 2015-06-22 LAB — GLUCOSE, CAPILLARY
GLUCOSE-CAPILLARY: 129 mg/dL — AB (ref 65–99)
GLUCOSE-CAPILLARY: 177 mg/dL — AB (ref 65–99)
GLUCOSE-CAPILLARY: 118 mg/dL — AB (ref 65–99)
Glucose-Capillary: 168 mg/dL — ABNORMAL HIGH (ref 65–99)
Glucose-Capillary: 120 mg/dL — ABNORMAL HIGH (ref 65–99)
Glucose-Capillary: 111 mg/dL — ABNORMAL HIGH (ref 65–99)

## 2015-06-22 LAB — C DIFFICILE QUICK SCREEN W PCR REFLEX
C DIFFICLE (CDIFF) ANTIGEN: NEGATIVE
C Diff interpretation: NEGATIVE
C Diff toxin: NEGATIVE

## 2015-06-22 NOTE — Progress Notes (Signed)
Patient information reviewed and entered into eRehab system by Andre Swander, RN, CRRN, PPS Coordinator.  Information including medical coding and functional independence measure will be reviewed and updated through discharge.     Per nursing patient was given "Data Collection Information Summary for Patients in Inpatient Rehabilitation Facilities with attached "Privacy Act Statement-Health Care Records" upon admission.  

## 2015-06-22 NOTE — Evaluation (Signed)
Speech Language Pathology Assessment and Plan  Patient Details  Name: Ivan Beasley MRN: [id number] Date of Birth: [birth year]  SLP Diagnosis: Cognitive Impairments;Speech and Language deficits  Rehab Potential: Good ELOS: 24-27 days     Today's Date: 06/22/2015 SLP Individual Time: 1000-1100 SLP Individual Time Calculation (min): 60 min   Problem List:  Patient Active Problem List   Diagnosis Date Noted  . SAH (subarachnoid hemorrhage) ([place]) 07/02/2015  . Coagulopathy ([place]) - eliquis related 06/26/2015  . Brain metastases ([place]), possible 06/23/2015  . Seizures ([place]), possible 06/27/2015  . Asystole ([place]) 06/29/2015  . Hypophosphatemia 06/24/2015  . Hypomagnesemia 06/22/2015  . CKD (chronic kidney disease), stage III 06/30/2015  . Anemia 07/08/2015  . Right hemiparesis ([place]) 06/16/2015  . Cough   . Idiopathic hypotension   . Lung cancer ([place])   . ICH (intracerebral hemorrhage) ([place])- L frontal ICH 06/13/2015  . Paroxysmal atrial fibrillation ([place]) 05/13/2015  . PNA (pneumonia) 05/13/2015  . HCAP (healthcare-associated pneumonia)   . Pneumonitis   . Acute respiratory failure ([place])   . Acute on chronic respiratory failure with hypoxia ([place]) 04/17/2015  . Respiratory failure ([place]) 04/17/2015  . Cancer of lung ([place]) 05/17/2014  . COPD, mild ([place]) 08/29/2013  . Cough, persistent 08/29/2013  . Mild chronic obstructive pulmonary disease ([place]) 08/29/2013  . H/O cardiac catheterization 07/29/2013  . Breath shortness 06/23/2013  . Chest pain 06/23/2013  . BP (high blood pressure) 06/20/2013  . Diabetes ([place]) 06/20/2013  . HLD (hyperlipidemia) 06/20/2013  . H/O coronary artery bypass surgery 06/20/2013  . Diabetes mellitus ([place]) 06/20/2013  . H/O total knee replacement 06/03/2013   Past Medical History:  Past Medical History  Diagnosis Date  . Coronary artery disease   . Pneumonia 2000  . Arthritis   . 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 Bruises easily    . GERD (gastroesophageal reflux disease)     takes Omeprazole daily as needed for stomach pain  . Blood transfusion 2001  . Diabetes mellitus     takes Metformin daily;  . Impaired hearing     bil hearing aide  . Macular degeneration     being watched for this but hasn't been "completely" diagnosed  . Myocardial infarction ([place]) 2001  . Cancer [facility])    Past Surgical History:  Past Surgical History  Procedure Laterality Date  . 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 Cardiac catheterization  2001  . Tonsillectomy      as a child  . Colonoscopy    . Cataract extraction      bilateral  . Lumbar laminectomy/decompression microdiscectomy  12/19/2010    Procedure: LUMBAR LAMINECTOMY/DECOMPRESSION MICRODISCECTOMY;  Surgeon: Elaina Hoops;  Location: [facility];  Service: Neurosurgery;  Laterality: N/A;  Lumbar three-four, four-five decompressive lumbar laminectomy  . Joint replacement  right tkr  . Eye surgery      cataracts  . Back surgery      l4 5  . Portacath placement N/A 05/20/2014    Procedure: INSERTION PORT-A-CATH;  Surgeon: Nestor Lewandowsky, MD;  Location: [facility] ORS;  Service: General;  Laterality: N/A;    Assessment / Plan / Recommendation Clinical Impression [name] is a 75 y.o. 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 Per chart review patient is married and independent prior to admission and driving short distances. One level home with 2 steps to entry. Presented with 06/13/2015 to [facility] with altered mental status and right-sided weakness. CT of the head revealed left frontal intracerebral hemorrhage with subarachnoid  hemorrhage. Patient's eliquis was discontinued. Patient was transferred to [facility] for further evaluation. MRI of the brain showed posterior left frontal lobe complex 2.5 x 2.2 x 2 cm hemorrhagic lesion with marked surrounding vasogenic edema with breakthrough into sulci with subarachnoid hemorrhage noted. Maintained on Keppra for seizure prophylaxis. EEG mild generalized nonspecific cerebral dysfunction consistent with encephalopathy. Recent echocardiogram with ejection fraction of 50% without embolus. Lower extremity Dopplers negative. Plan to repeat MRI for further evaluation of possible metastases posterior left frontal lobe after ICH absorbed. No current plan to resume eliquis at this time. Physical and Occupational therapy evaluations completed with recommendations for  physical medicine rehabilitation consult.   Patient was admitted to [facility] 07/01/2015 and demonstrates moderate cognitive impairments characterized by impaired sustained attention, emergent awareness of deficits and poor recall, which impact the patient's overall safety with functional, basic self-care tasks. Patient also demonstrates a flat affect and word retrieval difficulty in conversation which results in him feeling like he is no longer a participant and that people task around him.  Patient would benefit from skilled SLP intervention in order to maximize their functional independence prior to discharge. Anticipate patient will require 24 hour supervision at home health follow up SLP services.    Skilled Therapeutic Interventions          Cognitive-linguistic evaluation completed with results and recommendations reviewed with patient and family.  SLP also initiated skilled treatment by addressing cognitive deficits with a medication management task; patient was able to recall 2/7 meds due to new learning deficits.  An external memory aid was implemented to facilitate carryover and recall with patient  and wife input.      SLP Assessment  Patient will need skilled [place] [facility] during CIR admission    Recommendations  Oral Care Recommendations: Oral care BID Patient destination: Home Follow up Recommendations: 24 hour supervision/assistance;Home Health SLP Equipment Recommended: None recommended by SLP    SLP Frequency 3 to 5 out of 7 days   SLP Duration  SLP Intensity  SLP Treatment/Interventions 24-27 days   Minumum of 1-2 x/day, 30 to 90 minutes  Cognitive remediation/compensation;Cueing hierarchy;Environmental controls;Functional tasks;Internal/external aids;Medication managment;Multimodal communication approach;Patient/family education;Speech/Language facilitation;Therapeutic Exercise    Pain 0  Prior Functioning Decline over the last month per wife's report  Function:  Cognition Comprehension Comprehension assist level: Follows basic conversation/direction with extra time/assistive device  Expression   Expression assist level: Expresses basic 90% of the time/requires cueing < 10% of the time.  Social Interaction Social Interaction assist level: Interacts appropriately 90% of the time - Needs monitoring or encouragement for participation or interaction.  Problem Solving Problem solving assist level: Solves basic 50 - 74% of the time/requires cueing 25 - 49% of the time  Memory Memory assist level: Recognizes or recalls 50 - 74% of the time/requires cueing 25 - 49% of the time   Short Term Goals: Week 1: SLP Short Term Goal 1 (Week 1): Patient will recall new daily information such an meds, functions and frequency with Min verbal cues to utilize external aids SLP Short Term Goal 2 (Week  1): Patient will solve problems realted to self-care, taking into account new deficits with Min verbal cues   SLP Short Term Goal 3 (Week 1): Patient will utilize word finding strategies during structured phrase-sentence level tasks with Mod verbal cues    Refer  to Care Plan for Long Term Goals  Recommendations for other services: None  Discharge Criteria: Patient will be discharged from SLP if patient refuses treatment 3 consecutive times without medical reason, if treatment goals not met, if there is a change in medical status, if patient makes no progress towards goals or if patient is discharged from hospital.  The above assessment, treatment plan, treatment alternatives and goals were discussed and mutually agreed upon: by patient and by family  Carmelia Roller., [place]  [place] 06/23/2015, 11:33 AM

## 2015-06-22 NOTE — Progress Notes (Signed)
   06/22/15 1734  Clinical Encounter Type  Visited With Patient and family together  Visit Type Follow-up;Spiritual support  Referral From Family  Spiritual Encounters  Spiritual Needs Prayer   Chaplain provided follow-up care. Patient seems to have had a good first day at the rehab unit. Chaplain offered prayer and support. Spiritual care services available as needed.   Jeri Lager, Chaplain 06/22/2015 5:35 PM

## 2015-06-22 NOTE — Progress Notes (Signed)
Initial Nutrition Assessment  DOCUMENTATION CODES:   Non-severe (moderate) malnutrition in context of chronic illness  INTERVENTION:  Continue Boost Plus po TID, each supplement provides 360 kcal and 14 grams of protein.   Continue Ensure Enlive po TID PRN, each supplement provides 350 kcal and 20 grams of protein.  Encourage adequate PO intake.   NUTRITION DIAGNOSIS:   Malnutrition related to chronic illness as evidenced by moderate depletions of muscle mass, moderate depletion of body fat, percent weight loss.  GOAL:   Patient will meet greater than or equal to 90% of their needs  MONITOR:   PO intake, Supplement acceptance, Weight trends, Labs, I & O's  REASON FOR ASSESSMENT:   Malnutrition Screening Tool    ASSESSMENT:   75 y.o. 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 with altered mental status and right-sided weakness. CT of the head revealed left frontal intracerebral hemorrhage with subarachnoid hemorrhage.  Meal completion has been 50-100%. Wife at bedside reports pt has been very fatigued after therapies today thus unable to eat all of his food at lunch time as it was difficult for him to stay awake. Wife reports pt usually consumes 3 meals a day at home with no other difficulties. Wife reports pt with weight loss. Per Epic weight records, pt with a 8% weight loss in 3 months. Pt currently has Boost Plus ordered and has been consuming them. Pt additionally has Ensure ordered PRN if po intake is <50% at meals. RD to continue with current orders.   Nutrition-Focused physical exam completed. Findings are mild to moderate fat depletion, moderate  muscle depletion, and mild edema.   Labs and medications reviewed.   Diet Order:  Diet heart healthy/carb modified Room service appropriate?: Yes; Fluid consistency:: Thin  Skin:  Reviewed, no issues  Last BM:  6/12  Height:   Ht Readings from Last 1 Encounters:  06/13/15 6' (1.829 m)    Weight:   Wt Readings from Last 1 Encounters:  06/22/15 204 lb 2.3 oz (92.6 kg)    Ideal Body Weight:  80.9 kg  BMI:  Body mass index is 27.68 kg/(m^2).  Estimated Nutritional Needs:   Kcal:  2150-2350  Protein:  110-120 grams  Fluid:  2.1 - 2.3 L/day  EDUCATION NEEDS:   No education needs identified at this time  Corrin Parker, MS, RD, LDN Pager # [phone] After hours/ weekend pager # [phone]

## 2015-06-22 NOTE — Evaluation (Signed)
Occupational Therapy Assessment and Plan  Patient Details  Name: Ivan Beasley MRN: [id number] Date of Birth: 27-Aug-1940  OT Diagnosis: abnormal posture, cognitive deficits, disturbance of vision, flaccid hemiplegia and hemiparesis, hemiplegia affecting dominant side and muscle weakness (generalized) Rehab Potential: Rehab Potential (ACUTE ONLY): Good ELOS: 24-27 days   Today's Date: 06/22/2015 OT Individual Time: 2751-7001 OT Individual Time Calculation (min): 63 min     Problem List:  Patient Active Problem List   Diagnosis Date Noted  . SAH (subarachnoid hemorrhage) ([place]) 06/15/2015  . Coagulopathy ([place]) - eliquis related 06/10/2015  . Brain metastases ([place]), possible 06/23/2015  . Seizures ([place]), possible 06/20/2015  . Asystole ([place]) 07/07/2015  . Hypophosphatemia 06/11/2015  . Hypomagnesemia 07/09/2015  . CKD (chronic kidney disease), stage III 06/13/2015  . Anemia 06/18/2015  . Right hemiparesis ([place]) 06/29/2015  . Cough   . Idiopathic hypotension   . Lung cancer ([place])   . ICH (intracerebral hemorrhage) ([place])- L frontal ICH 06/13/2015  . Paroxysmal atrial fibrillation ([place]) 05/13/2015  . PNA (pneumonia) 05/13/2015  . HCAP (healthcare-associated pneumonia)   . Pneumonitis   . Acute respiratory failure ([place])   . Acute on chronic respiratory failure with hypoxia ([place]) 04/17/2015  . Respiratory failure ([place]) 04/17/2015  . Cancer of lung ([place]) 05/17/2014  . COPD, mild ([place]) 08/29/2013  . Cough, persistent 08/29/2013  . Mild chronic obstructive pulmonary disease ([place]) 08/29/2013  . H/O cardiac catheterization 07/29/2013  . Breath shortness 06/23/2013  . Chest pain 06/23/2013  . BP (high blood pressure) 06/20/2013  . Diabetes ([place]) 06/20/2013  . HLD (hyperlipidemia) 06/20/2013  . H/O coronary artery bypass surgery 06/20/2013  . Diabetes mellitus ([place]) 06/20/2013  . H/O total knee replacement 06/03/2013    Past Medical History:  Past Medical History  Diagnosis  Date  . Coronary artery disease   . Pneumonia 2000  . Arthritis   . 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 Bruises easily   . GERD (gastroesophageal reflux disease)     takes Omeprazole daily as needed for stomach pain  . Blood transfusion 2001  . Diabetes mellitus     takes Metformin daily;  . Impaired hearing     bil hearing aide  . Macular degeneration     being watched for this but hasn't been "completely" diagnosed  . Myocardial infarction ([place]) 2001  . Cancer [facility])    Past Surgical History:  Past Surgical History  Procedure Laterality Date  . 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 Cardiac catheterization  2001  . Tonsillectomy      as a child  . Colonoscopy    . Cataract extraction      bilateral  . Lumbar laminectomy/decompression microdiscectomy  12/19/2010    Procedure: LUMBAR LAMINECTOMY/DECOMPRESSION MICRODISCECTOMY;  Surgeon: Elaina Hoops;  Location: [facility];  Service: Neurosurgery;  Laterality: N/A;  Lumbar three-four, four-five decompressive lumbar laminectomy  . Joint replacement  right tkr  . Eye surgery      cataracts  . Back surgery      l4 5  . Portacath placement N/A 05/20/2014    Procedure: INSERTION PORT-A-CATH;  Surgeon: Nestor Lewandowsky, MD;  Location: [facility] ORS;  Service: General;  Laterality: N/A;    Assessment & Plan Clinical Impression: Patient is a 75 y.o.  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 Per chart review patient is married and independent prior to admission and driving short distances. One level home with 2 steps to entry. Presented with 06/13/2015 to [facility] with altered mental  status and right-sided weakness. CT of the head revealed left frontal intracerebral hemorrhage with subarachnoid hemorrhage. Patient's eliquis was discontinued. Patient was transferred to [facility] for further evaluation. MRI of the brain showed posterior left frontal lobe complex 2.5 x 2.2 x 2 cm hemorrhagic lesion with marked surrounding vasogenic edema with breakthrough into sulci with subarachnoid hemorrhage noted. Maintained on Keppra for seizure prophylaxis. EEG mild generalized nonspecific cerebral dysfunction consistent with encephalopathy. Recent echocardiogram with ejection fraction of 50% without embolus. Lower extremity Dopplers negative. Plan to repeat MRI for further evaluation of possible metastases posterior left frontal lobe after ICH absorbed. No current plan to resume eliquis at this time. Currently on a mechanical soft thin liquid diet. Physical and Occupational therapy evaluations completed with recommendations of physical medicine rehabilitation consult.   Patient transferred to CIR on 07/06/2015 .    Patient currently requires total with basic self-care skills secondary to muscle weakness, abnormal tone and unbalanced muscle activation, decreased visual acuity and decreased visual perceptual skills, decreased initiation, decreased attention, decreased awareness, decreased problem solving, decreased safety awareness, decreased memory and delayed processing and decreased sitting balance, decreased standing balance, decreased postural control, hemiplegia and decreased balance strategies.  Prior to hospitalization, patient could complete ADLs with min.  Patient will benefit from skilled intervention to decrease level of assist with basic self-care skills prior to discharge home with care partner.  Anticipate patient will require 24 hour supervision and minimal physical assistance and follow up home health.  OT - End of Session Activity Tolerance: Tolerates 30+ min activity with  multiple rests Endurance Deficit: Yes Endurance Deficit Description: requires several rest breaks throughout, increased fatigue towards end of session OT Assessment Rehab Potential (ACUTE ONLY): Good OT Patient demonstrates impairments in the following area(s): Balance;Cognition;Endurance;Motor;Perception;Safety;Sensory;Skin Integrity;Vision OT Basic ADL's Functional Problem(s): Eating;Grooming;Bathing;Dressing;Toileting OT Transfers Functional Problem(s): Toilet;Tub/Shower OT Additional Impairment(s): Fuctional Use of Upper Extremity OT Plan OT Intensity: Minimum of 1-2 x/day, 45 to 90 minutes OT Frequency: 5 out of 7 days OT Duration/Estimated Length of Stay: 24-27 days OT Treatment/Interventions: Balance/vestibular training;Cognitive remediation/compensation;Discharge planning;Disease mangement/prevention;DME/adaptive equipment instruction;Functional electrical stimulation;Functional mobility training;Neuromuscular re-education;Pain management;Patient/family education;Psychosocial support;Self Care/advanced ADL retraining;Skin care/wound managment;Splinting/orthotics;Therapeutic Activities;Therapeutic Exercise;UE/LE Strength taining/ROM;UE/LE Coordination activities;Visual/perceptual remediation/compensation;Wheelchair propulsion/positioning OT Self Feeding Anticipated Outcome(s): Clinical research associate Self-Care Anticipated Outcome(s): Min assist OT Toileting Anticipated Outcome(s): Min assist OT Bathroom Transfers Anticipated Outcome(s): Min assist OT Recommendation Recommendations for Other Services: Neuropsych consult Patient destination: Home Follow Up Recommendations: Home health OT (vs SNF if pt's wife unable to provide physical assist) Equipment Recommended: 3 in 1 bedside comode;Tub/shower bench;To be determined   Skilled Therapeutic Intervention OT eval completed with discussion of rehab process, OT purpose, POC, ELOS and goals.  ADL assessment completed at sink side with focus on  trunk control in sitting and standing as well as attention to task.  Pt lethargic and falling asleep throughout session, requiring increased time to respond to cues.  Max assist sit > stand with assist to manage RUE and tactile cues and manual facilitation for anterior weight shift.  +2 for all LB bathing and dressing tasks due to decreased standing  balance and tolerance.  Returned to bed at end of session with use of Stedy and +2 to manage upright standing balance while 2nd person placed Stedy seat flaps.  Wife present throughout session and assisting in providing PLOF.  OT Evaluation Precautions/Restrictions  Precautions Precautions: Fall Precaution Comments: R hemi, pt on home O2 PTA for recent pneumonia (but used it PRN, not 24/7) Restrictions Weight Bearing Restrictions: No Pain Pain Assessment Pain Assessment: No/denies pain Pain Score: 0-No pain PAINAD (Pain Assessment in Advanced Dementia) Breathing: normal Negative Vocalization: none Facial Expression: smiling or inexpressive Body Language: relaxed Consolability: no need to console PAINAD Score: 0 Home Living/Prior Functioning Home Living Available Help at Discharge: Family, Available 24 hours/day Type of Home: House Home Access: Stairs to enter Technical brewer of Steps: 2 Entrance Stairs-Rails: None Home Layout: One level Bathroom Shower/Tub: Chiropodist: Standard Bathroom Accessibility: No (not sure that a w/c would fit in the bathroom) Additional Comments: Daughter lives nearby  Lives With: Spouse IADL History Homemaking Responsibilities: Yes Meal Prep Responsibility: No Laundry Responsibility: Secondary Cleaning Responsibility: Secondary Bill Paying/Finance Responsibility: No Shopping Responsibility: Secondary Child Care Responsibility: No Homemaking Comments: loads/unloads dishwasher Current License: Yes Mode of Transportation:  ([employer] extended cab pickup truck) Occupation:  Retired (Retired Media planner. ) Leisure and Hobbies:  (Spending time wth 7y/o grandson; Watching car racing; Wordsearches; Workshop tinkering; Environmental education officer; Horses) Prior Function Level of Independence: Independent with transfers, Independent with basic ADLs, Independent with gait, Requires assistive device for independence  Able to Take Stairs?: Yes Driving: Yes Vocation: Retired Comments: Intermittently wears a brace on both knees due to pain. Pt is s/p TKR R knee. Used "staff" for ambulation outside ADL  See Function Navigator Vision/Perception  Vision- History Baseline Vision/History: Wears glasses;Macular Degeneration Wears Glasses: At all times Patient Visual Report: Blurring of vision Vision- Assessment Vision Assessment?: Vision impaired- to be further tested in functional context  Cognition Overall Cognitive Status: Impaired/Different from baseline Arousal/Alertness: Lethargic Orientation Level: Person;Situation Year: 2017 Month: June Day of Week: Incorrect (Monday) Immediate Memory Recall: Sock;Blue;Bed Memory Recall: Bed;Blue Memory Recall Blue: Without Cue Memory Recall Bed: Without Cue Attention: Sustained Sustained Attention: Impaired Sustained Attention Impairment: Functional basic Awareness: Impaired Problem Solving: Impaired Safety/Judgment: Impaired Sensation Sensation Light Touch: Impaired Detail;Impaired by gross assessment Light Touch Impaired Details: Impaired RUE;Impaired RLE Stereognosis: Not tested Hot/Cold: Not tested Proprioception: Impaired Detail Proprioception Impaired Details: Impaired RUE;Impaired RLE Coordination Gross Motor Movements are Fluid and Coordinated: No Fine Motor Movements are Fluid and Coordinated: No Finger Nose Finger Test: unable to complete as RUE flaccid Heel Shin Test: Slow and decreased excursion LLE, NT RLE due to strength deficits/apraxia 9 Hole Peg Test: unable to complete as RUE flaccid Motor   Motor Motor: Hemiplegia;Abnormal tone;Abnormal postural alignment and control;Motor impersistence;Motor apraxia Motor - Skilled Clinical Observations: R hemiplegia/paresis, impaired midline orientation and righting reactions with R lateral lean. No RLE AROM during MMT, however noted contaction at R quads/glutes in standing Mobility  Bed Mobility Bed Mobility: Supine to Sit Supine to Sit: 2: Max assist;With rails;HOB elevated Supine to Sit Details: Verbal cues for precautions/safety;Verbal cues for technique;Manual facilitation for placement;Manual facilitation for weight shifting;Tactile cues for placement  Trunk/Postural Assessment  Cervical Assessment Cervical Assessment: Exceptions to [facility] (forward head posture, cervical flexion) Thoracic Assessment Thoracic Assessment: Exceptions to [facility] (rounded shoulders, R lateral shift) Lumbar Assessment Lumbar Assessment: Exceptions to [facility] (decreased lumbar lordosis) Postural Control Postural Control: Deficits on evaluation (consistent LOB to R side, pushing  with LUE, requires max tactile cues to reach and maintain midline)  Balance Balance Balance Assessed: Yes Static Sitting Balance Static Sitting - Balance Support: Left upper extremity supported;Feet supported Static Sitting - Level of Assistance: 4: Min assist Static Sitting - Comment/# of Minutes: able to maintain approximately 15 seconds in midline before LOB to R side Dynamic Sitting Balance Dynamic Sitting - Balance Support: Feet supported;Left upper extremity supported Dynamic Sitting - Level of Assistance: 2: Max assist Sitting balance - Comments: LOB to R side with any dynamic reaching or visual task Static Standing Balance Static Standing - Balance Support: Left upper extremity supported Static Standing - Level of Assistance: 2: Max assist Static Standing - Comment/# of Minutes: x30 sec with LUE on handrail Extremity/Trunk Assessment RUE Assessment RUE Assessment: Exceptions to  [facility] (flaccid) LUE Assessment LUE Assessment: Within Functional Limits   See Function Navigator for Current Functional Status.   Refer to Care Plan for Long Term Goals  Recommendations for other services: Neuropsych  Discharge Criteria: Patient will be discharged from OT if patient refuses treatment 3 consecutive times without medical reason, if treatment goals not met, if there is a change in medical status, if patient makes no progress towards goals or if patient is discharged from hospital.  The above assessment, treatment plan, treatment alternatives and goals were discussed and mutually agreed upon: by patient and by family  Ellwood Dense [facility] 06/22/2015, 3:23 PM

## 2015-06-22 NOTE — Progress Notes (Signed)
Occupational Therapy Session Note  Patient Details  Name: Ivan Beasley MRN: [id number] Date of Birth: [birth year]  Today's Date: 06/22/2015 OT Individual Time: 1400-1436 OT Individual Time Calculation (min): 36 min    Short Term Goals: Week 1:  OT Short Term Goal 1 (Week 1): Pt will demonstrate increased awareness to RUE by positioning appropriately to decrease shoulder subluxation OT Short Term Goal 2 (Week 1): Pt will completed squat pivot transfer to toilet with max assist of one caregiver OT Short Term Goal 3 (Week 1): Pt will complete UB dressing with mod assist OT Short Term Goal 4 (Week 1): Pt will complete LB dressing with max assist of one caregiver at sit > stand level OT Short Term Goal 5 (Week 1): Pt will complete bathing with mod assist at sit > stand level  Skilled Therapeutic Interventions/Progress Updates:  Treatment session with focus on rolling and bed positioning to increase safety and positioning of RUE during mobility and when at rest.  Provided pt with positioning handout and placed over bed to assist nursing with positioning to decrease risk of Rt shoulder subluxation.  Pt reports wet brief and shorts.  Engaged in rolling at bed level with min-mod assist when rolling to Rt with use of bed rails and +2 for positioning and care of RUE when rolling to Lt.  Therapy Documentation Precautions:  Precautions Precautions: Fall Precaution Comments: R hemi, pt on home O2 PTA for recent pneumonia (but used it PRN, not 24/7) Restrictions Weight Bearing Restrictions: No General:   Vital Signs: Therapy Vitals Temp: 98.4 F (36.9 C) Temp Source: Oral Pulse Rate: 95 Resp: 18 BP: 110/60 mmHg Patient Position (if appropriate): Lying Oxygen Therapy SpO2: 92 % O2 Device: Not Delivered Pain: Pt with no c/o pain  See Function Navigator for Current Functional Status.   Therapy/Group: Individual Therapy  [name] 06/22/2015, 3:34 PM

## 2015-06-22 NOTE — Evaluation (Signed)
Physical Therapy Assessment and Plan  Patient Details  Name: Ivan Beasley MRN: [id number] Date of Birth: [birth year]  PT Diagnosis: Abnormal posture, Abnormality of gait, Cognitive deficits, Difficulty walking, Hemiplegia dominant, Impaired cognition, Impaired sensation and Muscle weakness Rehab Potential: Good ELOS: 24-27 days   Today's Date: 06/22/2015 PT Individual Time: 0900-1000 PT Individual Time Calculation (min): 60 min    Problem List:  Patient Active Problem List   Diagnosis Date Noted  . SAH (subarachnoid hemorrhage) ([place]) 06/29/2015  . Coagulopathy ([place]) - eliquis related 06/18/2015  . Brain metastases ([place]), possible 06/10/2015  . Seizures ([place]), possible 06/28/2015  . Asystole ([place]) 06/27/2015  . Hypophosphatemia 07/03/2015  . Hypomagnesemia 06/19/2015  . CKD (chronic kidney disease), stage III 06/16/2015  . Anemia 07/07/2015  . Right hemiparesis ([place]) 07/05/2015  . Cough   . Idiopathic hypotension   . Lung cancer ([place])   . ICH (intracerebral hemorrhage) ([place])- L frontal ICH 06/13/2015  . Paroxysmal atrial fibrillation ([place]) 05/13/2015  . PNA (pneumonia) 05/13/2015  . HCAP (healthcare-associated pneumonia)   . Pneumonitis   . Acute respiratory failure ([place])   . Acute on chronic respiratory failure with hypoxia ([place]) 04/17/2015  . Respiratory failure ([place]) 04/17/2015  . Cancer of lung ([place]) 05/17/2014  . COPD, mild ([place]) 08/29/2013  . Cough, persistent 08/29/2013  . Mild chronic obstructive pulmonary disease ([place]) 08/29/2013  . H/O cardiac catheterization 07/29/2013  . Breath shortness 06/23/2013  . Chest pain 06/23/2013  . BP (high blood pressure) 06/20/2013  . Diabetes ([place]) 06/20/2013  . HLD (hyperlipidemia) 06/20/2013  . H/O coronary artery bypass surgery 06/20/2013  . Diabetes mellitus ([place]) 06/20/2013  . H/O total knee replacement 06/03/2013    Past Medical History:  Past Medical History  Diagnosis Date  . Coronary artery disease   .  Pneumonia 2000  . Arthritis   . 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 Bruises easily   . GERD (gastroesophageal reflux disease)     takes Omeprazole daily as needed for stomach pain  . Blood transfusion 2001  . Diabetes mellitus     takes Metformin daily;  . Impaired hearing     bil hearing aide  . Macular degeneration     being watched for this but hasn't been "completely" diagnosed  . Myocardial infarction ([place]) 2001  . Cancer [facility])    Past Surgical History:  Past Surgical History  Procedure Laterality Date  . 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 Cardiac catheterization  2001  . Tonsillectomy      as a child  . Colonoscopy    . Cataract extraction      bilateral  . Lumbar laminectomy/decompression microdiscectomy  12/19/2010    Procedure: LUMBAR LAMINECTOMY/DECOMPRESSION MICRODISCECTOMY;  Surgeon: Elaina Hoops;  Location: [facility];  Service: Neurosurgery;  Laterality: N/A;  Lumbar three-four, four-five decompressive lumbar laminectomy  . Joint replacement  right tkr  . Eye surgery      cataracts  . Back surgery      l4 5  . Portacath placement N/A 05/20/2014    Procedure: INSERTION PORT-A-CATH;  Surgeon: Nestor Lewandowsky, MD;  Location: [facility] ORS;  Service: General;  Laterality: N/A;    Assessment & Plan Clinical Impression: Ivan Beasley is a 75 y.o. 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 Presented with 06/13/2015 to [facility] with altered mental status and right-sided weakness. CT of the head revealed left frontal intracerebral hemorrhage with subarachnoid hemorrhage. Patient's eliquis was discontinued. Patient  was transferred to [facility] for further evaluation. MRI of the brain showed posterior left frontal lobe complex 2.5 x 2.2 x 2 cm hemorrhagic lesion with marked surrounding vasogenic edema with breakthrough into sulci with subarachnoid hemorrhage noted. Maintained on Keppra for seizure prophylaxis. EEG mild generalized nonspecific cerebral dysfunction consistent with encephalopathy. Recent echocardiogram with ejection fraction of 50% without embolus. Lower extremity Dopplers negative. Plan to repeat MRI for further evaluation of possible metastases posterior left frontal lobe after ICH absorbed. No current plan to resume eliquis at this time. Currently on a mechanical soft thin liquid diet. Patient transferred to CIR on 07/08/2015 .   Patient currently requires max with mobility secondary to muscle weakness and muscle paralysis, decreased cardiorespiratoy endurance, unbalanced muscle activation, motor apraxia and decreased coordination, decreased initiation, decreased attention, decreased awareness, decreased problem solving, decreased safety awareness and delayed processing and decreased sitting balance, decreased standing balance, decreased postural control, hemiplegia and decreased balance strategies.  Prior to hospitalization, patient was modified independent  with mobility and lived with Spouse in a House home.  Home access is 2Stairs to enter.  Patient will benefit from skilled PT intervention to maximize safe functional mobility, minimize fall risk and decrease caregiver burden for planned discharge home with 24 hour assist.  Anticipate patient will benefit from follow up [facility] at discharge.  PT - End of Session Activity Tolerance: Tolerates 30+ min activity with multiple rests Endurance Deficit: Yes Endurance Deficit Description: requires several rest breaks throughout PT Assessment Rehab Potential (ACUTE/IP ONLY): Good Barriers to Discharge: [place] home environment;Decreased  caregiver support PT Patient demonstrates impairments in the following area(s): Balance;Perception;Safety;Sensory;Endurance;Motor PT Transfers Functional Problem(s): Bed Mobility;Bed to Chair;Car;Furniture PT Locomotion Functional Problem(s): Ambulation;Wheelchair Mobility;Stairs PT Plan PT Intensity: Minimum of 1-2 x/day ,45 to 90 minutes PT Frequency: 5 out of 7 days PT Duration Estimated Length of Stay: 24-27 days PT Treatment/Interventions: Ambulation/gait training;Balance/vestibular training;Community reintegration;Cognitive remediation/compensation;Discharge planning;Disease management/prevention;DME/adaptive equipment instruction;Functional mobility training;Neuromuscular re-education;Patient/family education;Psychosocial support;Stair training;Therapeutic Activities;Therapeutic Exercise;UE/LE Strength taining/ROM;UE/LE Coordination activities;Visual/perceptual remediation/compensation;Wheelchair propulsion/positioning PT Transfers Anticipated Outcome(s): minA PT Locomotion Anticipated Outcome(s): modA gait in controlled environment; minA w/c propulsion PT Recommendation Recommendations for Other Services: Speech consult Follow Up Recommendations: Home health PT Patient destination: Home Equipment Recommended: To be determined  Skilled Therapeutic Intervention Pt received supine in bed with wife present; denies pain and agreeable to treatment. Initial PT evaluation performed and completed. Assessed all mobility as described below with maxA overall. Pt with impaired midline orientation and righting reactions with LOB consistently to R side with mild L pusher syndrome. With max cueing, pt able to achieve midline position while seated on EOB, however able to maintain approximately 15 seconds before losing balance to R side. Sit <>stand x4 trials with L rail in hall with maxA; cueing for hip extension, forward visual gaze to improve upright posture. Sit <>stand with +2A in [place] to assess best  transfer method for nursing staff; NT alerted and safety plan updated to reflect recommendations. Pt and wife educated in rehab process, goals, estimated LOS. Remained seated in w/c at completion of session with half lap tray and all needs in reach.  PT Evaluation Precautions/Restrictions Precautions Precautions:  Fall Precaution Comments: R hemi, pt on home O2 PTA for recent pneumonia (but used it PRN, not 24/7) Restrictions Weight Bearing Restrictions: No General Chart Reviewed: Yes Response to Previous Treatment: Not applicable Family/Caregiver Present: Yes  Pain Pain Assessment Pain Assessment: No/denies pain Pain Score: 0-No pain PAINAD (Pain Assessment in Advanced Dementia) Breathing: normal Negative Vocalization: none Facial Expression: smiling or inexpressive Body Language: relaxed Consolability: no need to console PAINAD Score: 0 Home Living/Prior Functioning Home Living Available Help at Discharge: Family;Available 24 hours/day Type of Home: House Home Access: Stairs to enter [employer] of Steps: 2 Entrance Stairs-Rails: None Home Layout: One level Additional Comments: Daughter lives nearby  Lives With: Spouse Prior Function Level of Independence: Independent with transfers;Independent with basic ADLs;Independent with gait;Requires assistive device for independence Vocation: Retired Comments: Intermittently wears a brace on both knees due to pain. Pt is s/p TKR R knee. Used "staff" for ambulation outside Vision/Perception    Defer to OT evaluation; not formally tested during session due to time constraints.  Cognition Overall Cognitive Status: Impaired/Different from baseline Arousal/Alertness: Lethargic Attention: Sustained Sustained Attention: Impaired Sustained Attention Impairment: Functional basic Awareness: Impaired Problem Solving: Impaired Safety/Judgment: Impaired Sensation Sensation Light Touch: Impaired by gross assessment (able to  discriminate R/L for light touch BLEs, some distinction on RLE when BLE stimulated) Stereognosis: Not tested Hot/Cold: Not tested Proprioception: Impaired Detail Proprioception Impaired Details: Impaired RUE;Impaired RLE Coordination Gross Motor Movements are Fluid and Coordinated: No Heel Shin Test: Slow and decreased excursion LLE, NT RLE due to strength deficits/apraxia Motor  Motor Motor: Hemiplegia;Abnormal tone;Abnormal postural alignment and control;Motor impersistence;Motor apraxia Motor - Skilled Clinical Observations: R hemiplegia/paresis, impaired midline orientation and righting reactions with R lateral lean. No RLE AROM during MMT, however noted contaction at R quads/glutes in standing  Mobility Bed Mobility Bed Mobility: Supine to Sit Supine to Sit: 2: Max assist;With rails;HOB elevated Supine to Sit Details: Verbal cues for precautions/safety;Verbal cues for technique;Manual facilitation for placement;Manual facilitation for weight shifting;Tactile cues for placement Transfers Transfers: Yes Squat Pivot Transfers: 2: Max assist;With armrests Squat Pivot Transfer Details: Verbal cues for technique;Verbal cues for precautions/safety;Verbal cues for sequencing;Manual facilitation for placement;Manual facilitation for weight shifting;Tactile cues for posture Squat Pivot Transfer Details (indicate cue type and reason): cues for hand placement to reduce LUE pushing, facilitate forward trunk flexion and anterior weight shift Locomotion  Ambulation Ambulation: No Gait Gait: No Stairs / Additional Locomotion Stairs: No Wheelchair Mobility Wheelchair Mobility: Yes Wheelchair Assistance: 2: Max Technical sales engineer Details: Verbal cues for technique;Verbal cues for precautions/safety;Manual facilitation for placement;Verbal cues for sequencing Wheelchair Propulsion: Right lower extremity;Right upper extremity Distance: max hand-over-fout cues for RLE assist to steer w/c  and maintain straight trajectory  Trunk/Postural Assessment  Cervical Assessment Cervical Assessment: Exceptions to [facility] (forward head posture, cervical flexion) Thoracic Assessment Thoracic Assessment: Exceptions to [facility] (rounded shoulders, R lateral shift) Lumbar Assessment Lumbar Assessment: Exceptions to [facility] (decreased lumbar lordosis) Postural Control Postural Control: Deficits on evaluation (consistent LOB to R side, pushing with LUE, requires max tactile cues to reach and maintain midline)  Balance Balance Balance Assessed: Yes Static Sitting Balance Static Sitting - Balance Support: Left upper extremity supported;Feet supported Static Sitting - Level of Assistance: 4: Min assist Static Sitting - Comment/# of Minutes: able to maintain approximately 15 seconds in midline before LOB to R side Dynamic Sitting Balance Dynamic Sitting - Balance Support: Feet supported;Left upper extremity supported Dynamic Sitting - Level of Assistance: 2: Max assist Sitting balance -  Comments: LOB to R side with any dynamic reaching or visual task Static Standing Balance Static Standing - Balance Support: Left upper extremity supported Static Standing - Level of Assistance: 2: Max assist Static Standing - Comment/# of Minutes: x30 sec with LUE on handrail Extremity Assessment  RUE Assessment RUE Assessment: Exceptions to [facility] (flaccid) LUE Assessment LUE Assessment: Within Functional Limits RLE Assessment RLE Assessment: Exceptions to [facility] (0/5 throughout on MMT likely due to apraxia; noted palpable contraction of R quads/glutes in standing and able to support body weight with modA) LLE Assessment LLE Assessment: Within Functional Limits   See Function Navigator for Current Functional Status.   Refer to Care Plan for Long Term Goals  Recommendations for other services: None  Discharge Criteria: Patient will be discharged from PT if patient refuses treatment 3 consecutive times without  medical reason, if treatment goals not met, if there is a change in medical status, if patient makes no progress towards goals or if patient is discharged from hospital.  The above assessment, treatment plan, treatment alternatives and goals were discussed and mutually agreed upon: by patient and by family  Luberta Mutter 06/22/2015, 3:50 PM

## 2015-06-22 NOTE — Progress Notes (Signed)
75 y.o. 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 Per chart review patient is married and independent prior to admission and driving short distances. One level home with 2 steps to entry. Presented with 06/13/2015 to [facility] with altered mental status and right-sided weakness. CT of the head revealed left frontal intracerebral hemorrhage with subarachnoid hemorrhage. Patient's eliquis was discontinued. Patient was transferred to [facility] for further evaluation. MRI of the brain showed posterior left frontal lobe complex 2.5 x 2.2 x 2 cm hemorrhagic lesion with marked surrounding vasogenic edema with breakthrough into sulci with subarachnoid hemorrhage noted. Maintained on Keppra for seizure prophylaxis. EEG mild generalized nonspecific cerebral dysfunction consistent with encephalopathy. Recent echocardiogram with ejection fraction of 50% without embolus. Lower extremity Dopplers negative. Plan to repeat MRI for further evaluation of possible metastases posterior left frontal lobe after ICH absorbed. No current plan to resume eliquis at this time.  Subjective/Complaints:   Objective: Vital Signs: Blood pressure 112/56, pulse 89, temperature 98.6 F (37 C), temperature source Oral, resp. rate 16, weight 92.6 kg (204 lb 2.3 oz), SpO2 93 %. Dg Chest Port 1 View  06/25/2015  CLINICAL DATA:  Cough and congestion. EXAM: PORTABLE CHEST 1 VIEW COMPARISON:  June 18, 2015 FINDINGS: There is persistent airspace consolidation in the left upper lobe with volume loss. Lungs elsewhere clear. Heart is upper normal in size with pulmonary vascularity within normal limits.  Port-A-Cath tip is in the superior vena cava near the cavoatrial junction, stable. No pneumothorax. No adenopathy evident. Patient is status post coronary artery bypass grafting. IMPRESSION: Persistent left upper lobe airspace consolidation with volume loss. No change from 3 days prior. Stable cardiac silhouette. Electronically Signed   By: Lowella Grip III M.D.   On: 06/10/2015 07:25   Results for orders placed or performed during the hospital encounter of 06/26/2015 (from the past 72 hour(s))  CBC WITH DIFFERENTIAL     Status: Abnormal   Collection Time: 07/03/2015  5:22 PM  Result Value Ref Range   WBC 7.0 4.0 - 10.5 K/uL   RBC 3.18 (L) 4.22 - 5.81 MIL/uL   Hemoglobin 8.5 (L) 13.0 - 17.0 g/dL   HCT 27.8 (L) 39.0 - 52.0 %   MCV 87.4 78.0 - 100.0 fL   MCH 26.7 26.0 - 34.0 pg   MCHC 30.6 30.0 - 36.0 g/dL   RDW 19.0 (H) 11.5 - 15.5 %   Platelets 180 150 - 400 K/uL   Neutrophils Relative % 72 %   Neutro Abs 5.1 1.7 - 7.7 K/uL   Lymphocytes Relative 14 %   Lymphs Abs 1.0 0.7 - 4.0 K/uL   Monocytes Relative 11 %   Monocytes Absolute 0.8 0.1 - 1.0 K/uL   Eosinophils Relative 3 %   Eosinophils Absolute 0.2 0.0 - 0.7 K/uL   Basophils Relative 0 %   Basophils Absolute 0.0 0.0 - 0.1 K/uL  Comprehensive metabolic panel     Status: Abnormal   Collection Time: 06/27/2015  5:22 PM  Result Value Ref Range   Sodium 135 135 - 145 mmol/L   Potassium 3.9 3.5 - 5.1 mmol/L   Chloride 106 101 - 111 mmol/L   CO2 19 (L) 22 - 32 mmol/L  Glucose, Bld 148 (H) 65 - 99 mg/dL   BUN 25 (H) 6 - 20 mg/dL   Creatinine, Ser 1.18 0.61 - 1.24 mg/dL   Calcium 9.2 8.9 - 10.3 mg/dL   Total Protein 6.7 6.5 - 8.1 g/dL   Albumin 2.9 (L) 3.5 - 5.0 g/dL   AST 21 15 - 41 U/L   ALT 25 17 - 63 U/L   Alkaline Phosphatase 137 (H) 38 - 126 U/L   Total Bilirubin 0.4 0.3 - 1.2 mg/dL   GFR calc non Af Amer 59 (L) >60 mL/min   GFR calc Af Amer >60 >60 mL/min    Comment: (NOTE) The eGFR has been calculated using the CKD EPI  equation. This calculation has not been validated in all clinical situations. eGFR's persistently <60 mL/min signify possible Chronic Kidney Disease.    Anion gap 10 5 - 15  Glucose, capillary     Status: None   Collection Time: 07/02/2015  7:58 PM  Result Value Ref Range   Glucose-Capillary 81 65 - 99 mg/dL  C difficile quick scan w PCR reflex     Status: None   Collection Time: 06/15/2015 11:13 PM  Result Value Ref Range   C Diff antigen NEGATIVE NEGATIVE   C Diff toxin NEGATIVE NEGATIVE   C Diff interpretation Negative for toxigenic C. difficile   Glucose, capillary     Status: Abnormal   Collection Time: 07/05/2015 11:50 PM  Result Value Ref Range   Glucose-Capillary 104 (H) 65 - 99 mg/dL   Comment 1 Notify RN   Glucose, capillary     Status: Abnormal   Collection Time: 06/22/15  4:03 AM  Result Value Ref Range   Glucose-Capillary 129 (H) 65 - 99 mg/dL   Comment 1 Notify RN      HEENT: normal Cardio: irregular irregular Resp: CTA B/L and unlabored GI: BS positive and nontender nondistended Extremity:  No Edema Skin:   Intact Neuro: Flat, Abnormal Sensory cannot assess secondary to aphasia, Abnormal Motor Trace finger flexors otherwise 0 in the left upper and left lower limb, Dysarthric and Aphasic Musc/Skel:  Normal Gen. no acute distress   Assessment/Plan: 1. 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 Physiatrist is providing close team supervision and 24 hour management of active medical problems listed below. Physiatrist and rehab team continue to assess barriers to discharge/monitor patient progress toward functional and medical goals. FIM:          Function - Air cabin crew transfer activity did not occur: Safety/medical concerns        Function - Comprehension Comprehension: Auditory Comprehension  assistive device: Hearing aids Comprehension assist level: Follows basic conversation/direction with no assist  Function - Expression Expression: Verbal Expression assist level: Expresses basic needs/ideas: With extra time/assistive device  Function - Social Interaction Social Interaction assist level: Interacts appropriately with others with medication or extra time (anti-anxiety, antidepressant).  Function - Problem Solving Problem solving assist level: Solves basic 75 - 89% of the time/requires cueing 10 - 24% of the time  Function - Memory Memory assist level: Recognizes or recalls 50 - 74% of the time/requires cueing 25 - 49% of the time Patient normally able to recall (first 3 days only): Current season, Staff names and faces, That he or she is in a hospital    Medical Problem List and Plan: 1.  Right hemiplegia  secondary to left frontal ICH with adjacent SAH while on Eliquis. Plan to follow-up MRI of the brain after ICH absorbed for further evaluation of possible metastases posterior left frontal lobe             -PT, OT, speech eval today 2.  DVT Prophylaxis/Anticoagulation: SCDs. Monitor for signs of DVT 3. Pain Management: Ultram and oxycodone as needed 4. Mood: Xanax 0.25 mg 3 times daily as needed 5. Neuropsych: This patient is capable of making decisions on his own behalf. 6. Skin/Wound Care: Routine skin checks 7. Fluids/Electrolytes/Nutrition: Routine I&O's with follow-up chemistries 8. Seizure prophylaxis. Keppra now at 1000 mg twice a day---more alert. EEG displayed seizure activity 9. Atrial fibrillation. Cardiac rate control. Continue amiodarone 200 mg daily, Cardizem 60 mg twice a day.Eliquis held secondary to [place] 10. 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 As noted plan follow-up MRI after ICH absorbed for further evaluation of possible metastasis 11. Diabetes mellitus with peripheral  neuropathy. Hemoglobin A1c 6.8. Glucophage 1000 mg at breakfast and 500 mg supper, Amaryl 1 mg daily. Check blood sugars before meals and at bedtime 12. Hyperlipidemia. Lipitor  LOS (Days) 1 A FACE TO FACE EVALUATION WAS PERFORMED  Ivan Beasley 06/22/2015, 8:42 AM

## 2015-06-23 ENCOUNTER — Inpatient Hospital Stay (HOSPITAL_COMMUNITY): Payer: Medicare HMO | Admitting: Physical Therapy

## 2015-06-23 ENCOUNTER — Inpatient Hospital Stay (HOSPITAL_COMMUNITY): Payer: Medicare HMO | Admitting: Occupational Therapy

## 2015-06-23 ENCOUNTER — Inpatient Hospital Stay (HOSPITAL_COMMUNITY): Payer: Medicare HMO | Admitting: Speech Pathology

## 2015-06-23 DIAGNOSIS — I610 Nontraumatic intracerebral hemorrhage in hemisphere, subcortical: Secondary | ICD-10-CM

## 2015-06-23 LAB — GLUCOSE, CAPILLARY
GLUCOSE-CAPILLARY: 175 mg/dL — AB (ref 65–99)
GLUCOSE-CAPILLARY: 130 mg/dL — AB (ref 65–99)
GLUCOSE-CAPILLARY: 95 mg/dL (ref 65–99)
GLUCOSE-CAPILLARY: 101 mg/dL — AB (ref 65–99)

## 2015-06-23 MED ORDER — INSULIN ASPART 100 UNIT/ML ~~LOC~~ SOLN
0.0000 [IU] | Freq: Three times a day (TID) | SUBCUTANEOUS | Status: DC
  Administered 2015-06-24: 8 [IU] via SUBCUTANEOUS
  Administered 2015-06-25 – 2015-06-26 (×3): 2 [IU] via SUBCUTANEOUS
  Administered 2015-06-27: 4 [IU] via SUBCUTANEOUS
  Administered 2015-06-27 – 2015-06-28 (×4): 2 [IU] via SUBCUTANEOUS
  Administered 2015-06-28: 4 [IU] via SUBCUTANEOUS
  Administered 2015-06-28: 2 [IU] via SUBCUTANEOUS
  Administered 2015-06-29 (×3): 4 [IU] via SUBCUTANEOUS
  Administered 2015-06-30 – 2015-07-07 (×21): 2 [IU] via SUBCUTANEOUS
  Administered 2015-07-08: 4 [IU] via SUBCUTANEOUS
  Administered 2015-07-09 – 2015-07-14 (×15): 2 [IU] via SUBCUTANEOUS

## 2015-06-23 NOTE — Progress Notes (Signed)
Social Work Assessment and Plan  Patient Details  Name: Ivan Beasley MRN: [id number] Date of Birth: 1940/06/15  Today's Date: 06/22/2015  Problem List:  Patient Active Problem List   Diagnosis Date Noted  . SAH (subarachnoid hemorrhage) ([place]) 06/16/2015  . Coagulopathy ([place]) - eliquis related 06/19/2015  . Brain metastases ([place]), possible 07/04/2015  . Seizures ([place]), possible 07/09/2015  . Asystole ([place]) 06/28/2015  . Hypophosphatemia 06/23/2015  . Hypomagnesemia 06/15/2015  . CKD (chronic kidney disease), stage III 06/18/2015  . Anemia 06/13/2015  . Right hemiparesis ([place]) 06/13/2015  . Cough   . Idiopathic hypotension   . Lung cancer ([place])   . ICH (intracerebral hemorrhage) ([place])- L frontal ICH 06/13/2015  . Paroxysmal atrial fibrillation ([place]) 05/13/2015  . PNA (pneumonia) 05/13/2015  . HCAP (healthcare-associated pneumonia)   . Pneumonitis   . Acute respiratory failure ([place])   . Acute on chronic respiratory failure with hypoxia ([place]) 04/17/2015  . Respiratory failure ([place]) 04/17/2015  . Cancer of lung ([place]) 05/17/2014  . COPD, mild ([place]) 08/29/2013  . Cough, persistent 08/29/2013  . Mild chronic obstructive pulmonary disease ([place]) 08/29/2013  . H/O cardiac catheterization 07/29/2013  . Breath shortness 06/23/2013  . Chest pain 06/23/2013  . BP (high blood pressure) 06/20/2013  . Diabetes ([place]) 06/20/2013  . HLD (hyperlipidemia) 06/20/2013  . H/O coronary artery bypass surgery 06/20/2013  . Diabetes mellitus ([place]) 06/20/2013  . H/O total knee replacement 06/03/2013   Past Medical History:  Past Medical History  Diagnosis Date  . Coronary artery disease   . Pneumonia 2000  . Arthritis   . 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 Bruises easily   . GERD (gastroesophageal reflux disease)     takes Omeprazole daily as needed for stomach pain  . Blood transfusion 2001  . Diabetes mellitus     takes Metformin daily;  . Impaired hearing     bil  hearing aide  . Macular degeneration     being watched for this but hasn't been "completely" diagnosed  . Myocardial infarction ([place]) 2001  . Cancer [facility])    Past Surgical History:  Past Surgical History  Procedure Laterality Date  . 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 Cardiac catheterization  2001  . Tonsillectomy      as a child  . Colonoscopy    . Cataract extraction      bilateral  . Lumbar laminectomy/decompression microdiscectomy  12/19/2010    Procedure: LUMBAR LAMINECTOMY/DECOMPRESSION MICRODISCECTOMY;  Surgeon: Elaina Hoops;  Location: [facility];  Service: Neurosurgery;  Laterality: N/A;  Lumbar three-four, four-five decompressive lumbar laminectomy  . Joint replacement  right tkr  . Eye surgery      cataracts  . Back surgery      l4 5  . Portacath placement N/A 05/20/2014    Procedure: INSERTION PORT-A-CATH;  Surgeon: Nestor Lewandowsky, MD;  Location: [facility] ORS;  Service: General;  Laterality: N/A;   Social History:  reports that he quit smoking about 16 years ago. His smoking use included Cigarettes. He quit after 40 years of use. He has never used smokeless tobacco. He reports that he does not drink alcohol or use illicit drugs.  Family / Support Systems Marital Status: Married How Long?: 24 years Patient Roles: Spouse, Production manager, Other (Comment) (grandparent) Spouse/Significant Other: Merwyn Katos - wife - [phone] (h) and  [phone] (m) Children: Adria Devon - step-dtr - [phone] Other Supports: pt has 5 dtrs, as well Anticipated Caregiver: wife and daughter Olivia Mackie) Ability/Limitations of Caregiver: no limitations Caregiver Availability: 24/7 Family Dynamics: close, supportive wife.  willing/able to provide min assist level of care.  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 Recent Psychosocial Issues: CA treatments Psychiatric History: none reported Substance Abuse History: none reported  Patient / Family Perceptions, Expectations & Goals Pt/Family understanding of illness & functional limitations: Pt/wife report a good understanding of pt's condition and do not have any unanswered questions at this time. Premorbid pt/family roles/activities: Pt enjoys his horses, his dog, his family, and being outside. Anticipated changes in roles/activities/participation: Pt hopes to resume activities as he is able. Pt/family expectations/goals: Pt wants to get stronger and more independent and get home in two weeks.  Community Resources [employer]: Other (Comment) Premorbid Home Care/DME Agencies: Other (Comment) (Pt has had [place] in the past.  He has a rolling walker, cane, handheld shower spray, and home O2 at home.) Transportation available at discharge: wife Resource referrals recommended: Neuropsychology, Support group (specify)  Discharge Planning Living Arrangements: Spouse/significant other (Pt's and step-dtr's home are separated by a breezeway.  They are on the same property.) Support Systems: Spouse/significant other, Children, Other relatives, Friends/neighbors, Home care staff Type of Residence: Private residence Insurance Resources: Multimedia programmer (specify) ([place] Medicare) Financial Resources: [place], Family Support Financial Screen Referred:  No Living Expenses: Rent Money Management: Spouse (finances are tight) Does the patient have any problems obtaining your medications?: No Home Management: Pt's son-in-law helps with the outside of the home and pt's wife can manage the inside of the home. Patient/Family Preliminary Plans: Pt plans to return home with his wife and step-dtr to provide min assist care. Barriers to Discharge: Steps Social Work Anticipated Follow Up Needs: HH/OP, Support Group Expected length of stay: 24 to 27 days  Clinical Impression CSW met with pt and his wife to introduce self and role of CSW, as well as to complete assessment.  Pt was quite tired, but would try to follow the conversation with CSW and wife.  He is motivated to get home and glad to have the chance to come to CIR.  Pt has had [facility] for HH in the past and they would like to resume that at home.  Pt has a lot of DME already at home.  Wife and dtr to provide min assist at home.  [name] shared with CSW that finances have been tight with pt's medical appts and cancer treatments.  She is trying to pay monthly on medical bills and often has to rely on her dtr to assist with living expenses.  CSW has referred pt to [facility] pt navigator to see if they are eligible for any of their financial programs.  Pt was motivated and in fairly good spirits at the time of CSW's visit, just fatigued.  CSW will continue to offer support and follow pt through CIR stay.    Emrys Mceachron,  Silvestre Mesi 06/23/2015, 2:30 PM

## 2015-06-23 NOTE — Progress Notes (Signed)
Physical Therapy Session Note  Patient Details  Name: Ivan Beasley MRN: [id number] Date of Birth: 1940/09/22  Today's Date: 06/23/2015 PT Individual Time: 1400-1510 PT Individual Time Calculation (min): 70 min   Short Term Goals: Week 1:  PT Short Term Goal 1 (Week 1): Pt will perform bed mobility maxA from flat bed using bedrails PT Short Term Goal 2 (Week 1): Pt will perform squat pivot transfer modA PT Short Term Goal 3 (Week 1): Pt will initiate gait training with LRAD PT Short Term Goal 4 (Week 1): Pt will perform w/c propulsion modA with L hemi technique PT Short Term Goal 5 (Week 1): Pt will demonstrate sitting balance x3 min with minA  Skilled Therapeutic Interventions/Progress Updates:    Pt received seated in w/c, denies pain and agreeable to treatment. Wife reports pt very fatigued from earlier session. Lateral scoot w/c >mat table with maxA; transfer to return to w/c with transfer board and modA with improved pt initiation and assistance. Seated balance activities on edge of mat table with mirror visual feedback, external cues for L weight shift, dynamic LUE reaching in all directions to facilitate righting reactions and trunk control. 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 As pt being transported to parallel bars, pt became emotional/tearful, difficult to assess why due to aphasia, but does nod head yes when asked if he is frustrated and fatigued. Educated pt and wife on gradual progress, and importance of challenging pt with various activities to facilitate neuro recovery. Pt calmed down and agreeable to attempt sit <>stand; performed x1 trial with LUE on parallel bars and minA. Returned to room and transferred w/c >bed with Stedy and +2A. Rolling R/L with bedrails and minA to perform hygiene after incontinent bowel movement. RLE placed in hooklying to assist with rolling and note activation of  hip/knee musculature during task. Remained supine in bed with RN present and all needs within reach at completion of session.   Therapy Documentation Precautions:  Precautions Precautions: Fall Precaution Comments: R hemi, pt on home O2 PTA for recent pneumonia (but used it PRN, not 24/7) Restrictions Weight Bearing Restrictions: No Pain: Pain Assessment Pain Assessment: No/denies pain   See Function Navigator for Current Functional Status.   Therapy/Group: Individual Therapy  Luberta Mutter 06/23/2015, 3:25 PM

## 2015-06-23 NOTE — Progress Notes (Signed)
Pt with condom cath on at this time; emptied this AM for 300cc, no further voids.  Scanned at 1700 for 590cc, I/O cathed for 600cc.  Monitor, continue PVR scans.

## 2015-06-23 NOTE — Progress Notes (Signed)
Occupational Therapy Session Note  Patient Details  Name: Ivan Beasley MRN: [id number] Date of Birth: 10/12/1940  Today's Date: 06/23/2015 OT Individual Time: 1045-1200 OT Individual Time Calculation (min): 75 min    Short Term Goals: Week 1:  OT Short Term Goal 1 (Week 1): Pt will demonstrate increased awareness to RUE by positioning appropriately to decrease shoulder subluxation OT Short Term Goal 2 (Week 1): Pt will completed squat pivot transfer to toilet with max assist of one caregiver OT Short Term Goal 3 (Week 1): Pt will complete UB dressing with mod assist OT Short Term Goal 4 (Week 1): Pt will complete LB dressing with max assist of one caregiver at sit > stand level OT Short Term Goal 5 (Week 1): Pt will complete bathing with mod assist at sit > stand level  Skilled Therapeutic Interventions/Progress Updates:    Pt seen for skilled OT to facilitate RUE active movement, trunk control and adaptive techniques.[name] present throughout session. Pt received in bed, wife stating he had on clean pants and was already bathed on LB.  Pt worked on rolling to L in bed with max A and then was able to sit up using L arm to push up. Worked on trunk control in sitting. Initially with a R lean and then able to maintain static sit for up to a minute with close S. Pt then needed min A. To transfer to [facility], pt needed to use L hand to support R arm as he was pushing himself to the R with L arm. +2 to facilitate forward lean and wt shift to squat pivot to w/c. UB self care at sink with mod A. Pt then transported to gym to work on [employer]. Pt used towel on table with sh flex/ext with trace movement for sh flex. Attempted to facilitate elb flex and ext with trace elbow ext. Used UE ranger for PROM with visual focus on hand. Pt with difficulty maintaining attention on hand.  Hand over hand grasp and release of cones. Pt with slight finger flexion. Provided with soft foam block to practice squeezing. Pt  taken back to room with all needs met.  Therapy Documentation Precautions:  Precautions Precautions: Fall Precaution Comments: R hemi, pt on home O2 PTA for recent pneumonia (but used it PRN, not 24/7) Restrictions Weight Bearing Restrictions: No  Pain: Pain Assessment Pain Assessment: No/denies pain ADL:   See Function Navigator for Current Functional Status.   Therapy/Group: Individual Therapy  [place] 06/23/2015, 12:14 PM

## 2015-06-23 NOTE — Progress Notes (Signed)
Speech Language Pathology Daily Session Note  Patient Details  Name: Ivan Beasley MRN: [id number] Date of Birth: 06/23/1940  Today's Date: 06/23/2015 SLP Individual Time: 1310-1400 SLP Individual Time Calculation (min): 50 min  Short Term Goals: Week 1: SLP Short Term Goal 1 (Week 1): Patient will recall new daily information such an meds, functions and frequency with Min verbal cues to utilize external aids SLP Short Term Goal 2 (Week 1): Patient will solve problems realted to self-care, taking into account new deficits with Min verbal cues   SLP Short Term Goal 3 (Week 1): Patient will utilize word finding strategies during structured phrase-sentence level tasks with Mod verbal cues   SLP Short Term Goal 4 (Week 1): Patient will susutain attention to functional tasks for 8-10 minutnes with Min verbal cue for redirection   Skilled Therapeutic Interventions:  Pt was seen for skilled ST targeting cognitive goals.  SLP facilitated the session with a medication management task targeting functional problem solving and recall of daily information.  Pt utilized a written memory aid of currently scheduled medications with supervision verbal cues following initial instruction of how to locate information.  Pt then loaded pills of varying dosages and frequencies into a pill box with overall min assist verbal cues for working memory to facilitate pt's recall of where he left off in task after becoming intermittently distracted by the television.  Pt left in wheelchair with wife at bedside, handed off to primary PT.  Continue per current plan of care.    Function:  Eating Eating                 Cognition Comprehension Comprehension assist level: Follows basic conversation/direction with extra time/assistive device  Expression   Expression assist level: Expresses basic 90% of the time/requires cueing < 10% of the time.  Social Interaction Social Interaction assist level: Interacts  appropriately 75 - 89% of the time - Needs redirection for appropriate language or to initiate interaction.  Problem Solving Problem solving assist level: Solves basic 75 - 89% of the time/requires cueing 10 - 24% of the time  Memory Memory assist level: Recognizes or recalls 50 - 74% of the time/requires cueing 25 - 49% of the time    Pain Pain Assessment Pain Assessment: No/denies pain  Therapy/Group: Individual Therapy  Ivan Beasley, [name] 06/23/2015, 4:50 PM

## 2015-06-23 NOTE — Progress Notes (Signed)
[place] Individual Statement of Services  Patient Name:  Ivan Beasley  Date:  06/23/2015  Welcome to the [facility].  Our goal is to provide you with an individualized program based on your diagnosis and situation, designed to meet your specific needs.  With this comprehensive rehabilitation program, you will be expected to participate in at least 3 hours of rehabilitation therapies Monday-Friday, with modified therapy programming on the weekends.  Your rehabilitation program will include the following services:  Physical Therapy (PT), Occupational Therapy (OT), Speech Therapy (ST), 24 hour per day rehabilitation nursing, Case Management (Social Worker), Rehabilitation Medicine, Nutrition Services and Pharmacy Services  Weekly team conferences will be held on Wednesdays to discuss your progress.  Your Social Worker will talk with you frequently to get your input and to update you on team discussions.  Team conferences with you and your family in attendance may also be held.  Expected length of stay: 24 to 27 days  Overall anticipated outcome:  Minimal assistance  Depending on your progress and recovery, your program may change. Your Social Worker will coordinate services and will keep you informed of any changes. Your Social Worker's name and contact numbers are listed  below.  The following services may also be recommended but are not provided by the [facility] will be made to provide these services after discharge if needed.  Arrangements include referral to agencies that provide these services.  Your insurance has been verified to be:  [employer] Your primary doctor is:  Dr. Brayton Layman  Pertinent information will be shared with your doctor and your insurance company.  Social Worker:  Alfonse Alpers,  LCSW  [phone] or [phone]  Information discussed with and copy given to patient by: Trey Sailors, 06/23/2015, 11:21 AM

## 2015-06-23 NOTE — Progress Notes (Signed)
Social Work Patient ID: Ivan Beasley, male   DOB: 14-Sep-1940, 75 y.o.   MRN: [id number]   CSW met with pt and pt's wife to update them on team conference discussion and targeted d/c date of 07-15-15.  [name] was so excited when pt entered the room, stating how much better pt looked and was doing today.  CSW explained that we would re-conference pt again next week and that d/c date could be changed, as needed.  She expressed understanding.  She is able and willing to provide minimal assistance to pt as needed.  CSW called pt navigator at [facility], [place], and he will call wife to see if pt is eligible for any of their financial assistance.  CSW also alerted [facility] of pt's admission and that pt would like to resume services with her upon d/c.  CSW will continue to follow and assist as needed.

## 2015-06-23 NOTE — Progress Notes (Signed)
Subjective/Complaints: Patient was very fatigued after therapy but no other issues overnight. His wife is at the bedside Review of systems aphasic limiting review Objective: Vital Signs: Blood pressure 119/49, pulse 89, temperature 97.9 F (36.6 C), temperature source Oral, resp. rate 17, weight 90.9 kg (200 lb 6.4 oz), SpO2 95 %. No results found. Results for orders placed or performed during the hospital encounter of 06/29/2015 (from the past 72 hour(s))  CBC WITH DIFFERENTIAL     Status: Abnormal   Collection Time: 06/22/2015  5:22 PM  Result Value Ref Range   WBC 7.0 4.0 - 10.5 K/uL   RBC 3.18 (L) 4.22 - 5.81 MIL/uL   Hemoglobin 8.5 (L) 13.0 - 17.0 g/dL   HCT 27.8 (L) 39.0 - 52.0 %   MCV 87.4 78.0 - 100.0 fL   MCH 26.7 26.0 - 34.0 pg   MCHC 30.6 30.0 - 36.0 g/dL   RDW 19.0 (H) 11.5 - 15.5 %   Platelets 180 150 - 400 K/uL   Neutrophils Relative % 72 %   Neutro Abs 5.1 1.7 - 7.7 K/uL   Lymphocytes Relative 14 %   Lymphs Abs 1.0 0.7 - 4.0 K/uL   Monocytes Relative 11 %   Monocytes Absolute 0.8 0.1 - 1.0 K/uL   Eosinophils Relative 3 %   Eosinophils Absolute 0.2 0.0 - 0.7 K/uL   Basophils Relative 0 %   Basophils Absolute 0.0 0.0 - 0.1 K/uL  Comprehensive metabolic panel     Status: Abnormal   Collection Time: 06/19/2015  5:22 PM  Result Value Ref Range   Sodium 135 135 - 145 mmol/L   Potassium 3.9 3.5 - 5.1 mmol/L   Chloride 106 101 - 111 mmol/L   CO2 19 (L) 22 - 32 mmol/L   Glucose, Bld 148 (H) 65 - 99 mg/dL   BUN 25 (H) 6 - 20 mg/dL   Creatinine, Ser 1.18 0.61 - 1.24 mg/dL   Calcium 9.2 8.9 - 10.3 mg/dL   Total Protein 6.7 6.5 - 8.1 g/dL   Albumin 2.9 (L) 3.5 - 5.0 g/dL   AST 21 15 - 41 U/L   ALT 25 17 - 63 U/L   Alkaline Phosphatase 137 (H) 38 - 126 U/L   Total Bilirubin 0.4 0.3 - 1.2 mg/dL   GFR calc non Af Amer 59 (L) >60 mL/min   GFR calc Af Amer >60 >60 mL/min    Comment: (NOTE) The eGFR has been calculated using the CKD EPI equation. This calculation has not  been validated in all clinical situations. eGFR's persistently <60 mL/min signify possible Chronic Kidney Disease.    Anion gap 10 5 - 15  Glucose, capillary     Status: None   Collection Time: 06/30/2015  7:58 PM  Result Value Ref Range   Glucose-Capillary 81 65 - 99 mg/dL  C difficile quick scan w PCR reflex     Status: None   Collection Time: 06/29/2015 11:13 PM  Result Value Ref Range   C Diff antigen NEGATIVE NEGATIVE   C Diff toxin NEGATIVE NEGATIVE   C Diff interpretation Negative for toxigenic C. difficile   Glucose, capillary     Status: Abnormal   Collection Time: 06/18/2015 11:50 PM  Result Value Ref Range   Glucose-Capillary 104 (H) 65 - 99 mg/dL   Comment 1 Notify RN   Glucose, capillary     Status: Abnormal   Collection Time: 06/22/15  4:03 AM  Result Value Ref Range  Glucose-Capillary 129 (H) 65 - 99 mg/dL   Comment 1 Notify RN   Glucose, capillary     Status: Abnormal   Collection Time: 06/22/15  8:54 AM  Result Value Ref Range   Glucose-Capillary 168 (H) 65 - 99 mg/dL  Glucose, capillary     Status: Abnormal   Collection Time: 06/22/15 12:11 PM  Result Value Ref Range   Glucose-Capillary 177 (H) 65 - 99 mg/dL  Glucose, capillary     Status: Abnormal   Collection Time: 06/22/15  4:27 PM  Result Value Ref Range   Glucose-Capillary 118 (H) 65 - 99 mg/dL  Glucose, capillary     Status: Abnormal   Collection Time: 06/22/15  7:55 PM  Result Value Ref Range   Glucose-Capillary 120 (H) 65 - 99 mg/dL   Comment 1 Notify RN   Glucose, capillary     Status: Abnormal   Collection Time: 06/22/15 11:27 PM  Result Value Ref Range   Glucose-Capillary 111 (H) 65 - 99 mg/dL   Comment 1 Notify RN   Glucose, capillary     Status: Abnormal   Collection Time: 06/23/15  4:16 AM  Result Value Ref Range   Glucose-Capillary 175 (H) 65 - 99 mg/dL   Comment 1 Notify RN      HEENT: normal Cardio: irregular irregular Resp: CTA B/L and unlabored GI: BS positive and nontender  nondistended Extremity:  No Edema Skin:   Intact Neuro: Flat, Abnormal Sensory cannot assess secondary to aphasia, Abnormal Motor 2 minus finger flexors, biceps and triceps , 0 at deltoid, 2 minus at the hip knee extensors as well as plantar flexors. 0 at the ankle dorsiflexors., Dysarthric and Aphasic Musc/Skel:  Normal Gen. no acute distress   Assessment/Plan: 1. 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 Physiatrist is providing close team supervision and 24 hour management of active medical problems listed below. Physiatrist and rehab team continue to assess barriers to discharge/monitor patient progress toward functional and medical goals. FIM: Function - Bathing Position: Wheelchair/chair at sink Body parts bathed by patient: Chest, Abdomen, Left upper leg Body parts bathed by helper: Right arm, Left arm, Front perineal area, Buttocks, Right lower leg, Left lower leg, Back Bathing not applicable: Right upper leg Assist Level: 2 helpers  Function- Upper Body Dressing/Undressing What is the patient wearing?: Pull over shirt/dress Pull over shirt/dress - Perfomed by patient: Thread/unthread left sleeve Pull over shirt/dress - Perfomed by helper: Thread/unthread right sleeve, Put head through opening, Pull shirt over trunk Assist Level:  (Max assist) Function - Lower Body Dressing/Undressing What is the patient wearing?: Pants, Non-skid slipper socks Position: Wheelchair/chair at sink Pants- Performed by helper: Thread/unthread right pants leg, Thread/unthread left pants leg, Pull pants up/down Non-skid slipper socks- Performed by helper: Don/doff right sock, Don/doff left sock Assist for footwear: Dependant Assist for lower body dressing: 2 Helpers  Function - Toileting Toileting activity did not occur: No continent bowel/bladder  event  Function - Air cabin crew transfer activity did not occur: Safety/medical concerns  Function - Chair/bed transfer Chair/bed transfer method: Lateral scoot Chair/bed transfer assist level: Total assist (Pt < 25%) Chair/bed transfer assistive device: Armrests Chair/bed transfer details: Verbal cues for precautions/safety, Manual facilitation for weight shifting, Manual facilitation for placement, Verbal cues for sequencing, Verbal cues for technique, Tactile cues for placement, Tactile cues for posture  Function - Locomotion: Wheelchair Will  patient use wheelchair at discharge?: Yes Type: Manual Max wheelchair distance: 75 Assist Level: Maximal assistance (Pt 25 - 49%) Assist Level: Maximal assistance (Pt 25 - 49%) Wheel 150 feet activity did not occur: Safety/medical concerns Turns around,maneuvers to table,bed, and toilet,negotiates 3% grade,maneuvers on rugs and over doorsills: No Function - Locomotion: Ambulation Ambulation activity did not occur: Safety/medical concerns  Function - Comprehension Comprehension: Auditory Comprehension assistive device: Hearing aids Comprehension assist level: Follows basic conversation/direction with extra time/assistive device  Function - Expression Expression: Verbal Expression assist level: Expresses basic 90% of the time/requires cueing < 10% of the time.  Function - Social Interaction Social Interaction assist level: Interacts appropriately 90% of the time - Needs monitoring or encouragement for participation or interaction.  Function - Problem Solving Problem solving assist level: Solves basic 90% of the time/requires cueing < 10% of the time  Function - Memory Memory assist level: Recognizes or recalls 50 - 74% of the time/requires cueing 25 - 49% of the time Patient normally able to recall (first 3 days only): Current season    Medical Problem List and Plan: 1.  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 Plan to follow-up MRI of the brain after ICH absorbed for further evaluation of possible metastases posterior left frontal lobe             -Team conference today please see physician documentation under team conference tab, met with team face-to-face to discuss problems,progress, and goals. Formulized individual treatment plan based on medical history, underlying problem and comorbidities. 2.  DVT Prophylaxis/Anticoagulation: SCDs. Monitor for signs of DVT 3. Pain Management: Ultram and oxycodone as needed 4. Mood: Xanax 0.25 mg 3 times daily as needed 5. Neuropsych: This patient is capable of making decisions on his own behalf with caregiver/staff assistance. 6. Skin/Wound Care: Routine skin checks 7. Fluids/Electrolytes/Nutrition: Routine I&O's with follow-up chemistries 8. Seizure prophylaxis. Keppra now at 1000 mg twice a day---more alert. EEG displayed seizure activity 9. Atrial fibrillation. Cardiac rate control. Continue amiodarone 200 mg daily, Cardizem 60 mg twice a day.Eliquis held secondary to [place] 10. 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 As noted plan follow-up MRI after ICH absorbed for further evaluation of possible metastasis 11. Diabetes mellitus with peripheral neuropathy. Hemoglobin A1c 6.8. Glucophage 1000 mg at breakfast and 500 mg supper, Amaryl 1 mg daily. Check blood sugars before meals and at bedtime CBG (last 3) generally controlled  Recent Labs  06/22/15 1955 06/22/15 2327 06/23/15 0416  GLUCAP 120* 111* 175*    12. Hyperlipidemia. Lipitor  LOS (Days) 2 A FACE TO FACE EVALUATION WAS PERFORMED  KIRSTEINS,ANDREW E 06/23/2015, 9:29 AM

## 2015-06-23 NOTE — Patient Care Conference (Signed)
Inpatient RehabilitationTeam Conference and Plan of Care Update Date: 06/24/2015   Time: 11:05 AM    Patient Name: Ivan Beasley      Medical Record Number: [id number]  Date of Birth: 04-11-1940 Sex: Male         Room/Bed: 4W11C/4W11C-01 Payor Info: Payor: [employer] MEDICARE / Plan: [employer] MEDICARE HMO / Product Type: *No Product type* /    Admitting Diagnosis: R-ICH  Admit Date/Time:  06/26/2015  4:45 PM Admission Comments: No comment available   Primary Diagnosis:  ICH (intracerebral hemorrhage) ([facility]) Principal Problem: ICH (intracerebral hemorrhage) ([place])  Patient Active Problem List   Diagnosis Date Noted  . SAH (subarachnoid hemorrhage) ([place]) 06/26/2015  . Coagulopathy ([place]) - eliquis related 06/30/2015  . Brain metastases ([place]), possible 06/27/2015  . Seizures ([place]), possible 06/29/2015  . Asystole ([place]) 06/17/2015  . Hypophosphatemia 06/23/2015  . Hypomagnesemia 06/22/2015  . CKD (chronic kidney disease), stage III 07/01/2015  . Anemia 06/19/2015  . Right hemiparesis ([place]) 06/20/2015  . Cough   . Idiopathic hypotension   . Lung cancer ([place])   . ICH (intracerebral hemorrhage) ([place])- L frontal ICH 06/13/2015  . Paroxysmal atrial fibrillation ([place]) 05/13/2015  . PNA (pneumonia) 05/13/2015  . HCAP (healthcare-associated pneumonia)   . Pneumonitis   . Acute respiratory failure ([place])   . Acute on chronic respiratory failure with hypoxia ([place]) 04/17/2015  . Respiratory failure ([place]) 04/17/2015  . Cancer of lung ([place]) 05/17/2014  . COPD, mild ([place]) 08/29/2013  . Cough, persistent 08/29/2013  . Mild chronic obstructive pulmonary disease ([place]) 08/29/2013  . H/O cardiac catheterization 07/29/2013  . Breath shortness 06/23/2013  . Chest pain 06/23/2013  . BP (high blood pressure) 06/20/2013  . Diabetes ([place]) 06/20/2013  . HLD (hyperlipidemia) 06/20/2013  . H/O coronary artery bypass surgery 06/20/2013  . Diabetes mellitus ([place]) 06/20/2013  . H/O total knee replacement  06/03/2013    Expected Discharge Date: Expected Discharge Date: 07/15/15  Team Members Present: Physician leading conference: Dr. Alysia Penna Social Worker Present: Alfonse Alpers, LCSW Nurse Present: Heather Roberts, RN PT Present: Kem Parkinson, PT OT Present: [name], OT SLP Present: Windell Moulding, SLP PPS Coordinator present : Daiva Nakayama, RN, CRRN     Current Status/Progress Goal Weekly Team Focus  Medical   fatigues easily may be pushing ,   maintain medical stability monitor for embolic strkike  build endurance   Bowel/Bladder   Incontinent bowel and bladder; retention with PVR cath  600cc  Continent, no retention  Timed toileting, PVR scans, assist to stand to void   Swallow/Nutrition/ Hydration             ADL's   max - total assist +2 with transfers and ADLs  Min assist overall  ADL retraining, transfers, trunk control, RUE positioning and NMR   Mobility   mod/maxA bed mobility, max-totalA transfers and sit <>stand, modA w/c propulsion  minA overall  bed mobility, transfer training, activity tolerance   Communication   Mod assist   Supervision   ed and use of word finding strategies    Safety/Cognition/ Behavioral Observations  Mod assist   Sueprvision   use of external aids    Pain   Tylenol effective for occasional headache  managed at goal 2/10  monitor   Skin   MASD to buttocks, peri area, groin. Small skin tear scrotum  Resolving at discharge; no s/s infection, further breakdown  Monitor, reposition, continue barrier cream, MGP    Rehab Goals Patient on target  to meet rehab goals: Yes Rehab Goals Revised: none - pt's first conference *See Care Plan and progress notes for long and short-term goals.  Barriers to Discharge: very weak     Possible Resolutions to Barriers:  cont rehab will need longer LOS    Discharge Planning/Teaching Needs:  Pt plans to go home with his wife who will provide min assist, as needed.  [name] is here regularly  and will participate in family education closer to d/c.   Team Discussion:  Pt with diabetes, a-fib, hx of CABG, lung CA, and now with hemorrhage infarct.  Medical team is still looking at whether it was due to the Eliquis vs. Brain mets and it will not be known until about another month when the blood has reabsorbed.  Pt's strength is increasing.  Pt fatigued quickly with OT on eval and it was requested that his schedule be spaced out more in the future.  Pt with some pushing and goals are min assist, which may be lofty.  Pt was max assist with PT and noted that pt does lean to the right, but can respond and correct with cueing.  ST is still evaluating.  Revisions to Treatment Plan:  none   Continued Need for Acute Rehabilitation Level of Care: The patient requires daily medical management by a physician with specialized training in physical medicine and rehabilitation for the following conditions: Daily direction of a multidisciplinary physical rehabilitation program to ensure safe treatment while eliciting the highest outcome that is of practical value to the patient.: Yes Daily medical management of patient stability for increased activity during participation in an intensive rehabilitation regime.: Yes Daily analysis of laboratory values and/or radiology reports with any subsequent need for medication adjustment of medical intervention for : Neurological problems  Demiah Gullickson, Silvestre Mesi 06/24/2015, 11:35 AM

## 2015-06-24 ENCOUNTER — Inpatient Hospital Stay (HOSPITAL_COMMUNITY): Payer: Medicare HMO | Admitting: Speech Pathology

## 2015-06-24 ENCOUNTER — Inpatient Hospital Stay (HOSPITAL_COMMUNITY): Payer: Medicare HMO | Admitting: Physical Therapy

## 2015-06-24 ENCOUNTER — Encounter (HOSPITAL_COMMUNITY): Payer: Medicare HMO

## 2015-06-24 ENCOUNTER — Inpatient Hospital Stay (HOSPITAL_COMMUNITY): Payer: Medicare HMO | Admitting: Occupational Therapy

## 2015-06-24 LAB — GLUCOSE, CAPILLARY
GLUCOSE-CAPILLARY: 117 mg/dL — AB (ref 65–99)
GLUCOSE-CAPILLARY: 84 mg/dL (ref 65–99)
GLUCOSE-CAPILLARY: 96 mg/dL (ref 65–99)
Glucose-Capillary: 202 mg/dL — ABNORMAL HIGH (ref 65–99)
Glucose-Capillary: 116 mg/dL — ABNORMAL HIGH (ref 65–99)

## 2015-06-24 NOTE — Progress Notes (Signed)
Physical Therapy Session Note  Patient Details  Name: Ivan Beasley MRN: [id number] Date of Birth: [birth year]  Today's Date: 06/24/2015 PT Individual Time: 1000-1100 and 1330-1410  PT Individual Time Calculation (min): 60 min and 40 min (total 100 min)  Short Term Goals: Week 1:  PT Short Term Goal 1 (Week 1): Pt will perform bed mobility maxA from flat bed using bedrails PT Short Term Goal 2 (Week 1): Pt will perform squat pivot transfer modA PT Short Term Goal 3 (Week 1): Pt will initiate gait training with LRAD PT Short Term Goal 4 (Week 1): Pt will perform w/c propulsion modA with L hemi technique PT Short Term Goal 5 (Week 1): Pt will demonstrate sitting balance x3 min with minA  Skilled Therapeutic Interventions/Progress Updates:    Tx 1: Pt received asleep in w/c, easily awoken and denies pain. Transfer w/c >mat table with transfer board maxA with increased time, multimodal cues for sequencing and technique. Repetitive L elbow side sitting x5 for improved midline orientation and comfort with leaning to L side. L forward/lateral reaching with hand in therapists hand to facilitate L weight bearing, trunk lengthening. Sit <>stand 4 sets 3 reps from elevated mat table with modA initially, decreased to minA. Cues for forward gaze, trunk/hip extension, and weight bearing through RLE. Gait with L rail 2x25' with maxA for weight shifting, RLE progression and RLE stance control with hip/knee extension. Returned to room and remained seated in w/c at completion of session, all needs within reach.   Tx 2: Pt received seated in recliner, denies pain and agreeable to treatment. Transfer recliner>w/c with stedy and +2A. Upon sitting on stedy, pt with significant R lateral lean, unable to correct with verbal/tactile cues due to strong pushing with L extremities. Tall kneeling on mat table with +2A to get onto table, and min/modA +1 for balance and cueing for posture, hip extension in tall kneeling. Only  able to tolerate approximately 10 seconds at a time before requiring rest break on forearms. Transfer w/c >bed with transfer board and modA to L side. Sit >supine modA with cues for LLE hooking under RLE to A into bed. Remained supine in bed at completion of session, alarm intact and all needs within reach.   Therapy Documentation Precautions:  Precautions Precautions: Fall Precaution Comments: R hemi, pt on home O2 PTA for recent pneumonia (but used it PRN, not 24/7) Restrictions Weight Bearing Restrictions: No Pain: Pain Assessment Pain Assessment: No/denies pain   See Function Navigator for Current Functional Status.   Therapy/Group: Individual Therapy  Luberta Mutter 06/24/2015, 2:28 PM

## 2015-06-24 NOTE — Progress Notes (Addendum)
Speech Language Pathology Daily Session Note  Patient Details  Name: Ivan Beasley MRN: [id number] Date of Birth: Oct 04, 1940  Today's Date: 06/24/2015 SLP Individual Time: 1115-1140 SLP Individual Time Calculation (min): 25 min  Short Term Goals: Week 1: SLP Short Term Goal 1 (Week 1): Patient will recall new daily information such an meds, functions and frequency with Min verbal cues to utilize external aids SLP Short Term Goal 2 (Week 1): Patient will solve problems realted to self-care, taking into account new deficits with Min verbal cues   SLP Short Term Goal 3 (Week 1): Patient will utilize word finding strategies during structured phrase-sentence level tasks with Mod verbal cues   SLP Short Term Goal 4 (Week 1): Patient will susutain attention to functional tasks for 8-10 minutnes with Min verbal cue for redirection   Skilled Therapeutic Interventions:  Pt was seen for skilled ST targeting cognitive goals.  Pt completed medication management task that was initiated during yesterday's therapy session with overall supervision for initiation and organization.  Pt required min assist verbal cues to recognize and correct errors once pills were organized into pill box.  Pt was able to recall 1 specific detail from AM therapy session with supervision question cues.  Pt able to return demonstration of use of call bell to convey needs to nursing with supervision verbal cues.  Pt left in wheelchair with wife at bedside.  Continue per current plan of care.    Function:  Eating Eating                 Cognition Comprehension Comprehension assist level: Follows basic conversation/direction with extra time/assistive device  Expression   Expression assist level: Expresses basic 90% of the time/requires cueing < 10% of the time.  Social Interaction Social Interaction assist level: Interacts appropriately 75 - 89% of the time - Needs redirection for appropriate language or to initiate  interaction.  Problem Solving Problem solving assist level: Solves basic 75 - 89% of the time/requires cueing 10 - 24% of the time  Memory Memory assist level: Recognizes or recalls 50 - 74% of the time/requires cueing 25 - 49% of the time    Pain Pain Assessment Pain Assessment: No/denies pain  Therapy/Group: Individual Therapy  Dominque Marlin, [name] 06/24/2015, 1:03 PM

## 2015-06-24 NOTE — Progress Notes (Signed)
Occupational Therapy Session Note  Patient Details  Name: Ivan Beasley MRN: [id number] Date of Birth: 08-09-1940  Today's Date: 06/24/2015 OT Individual Time: 1017-5102 OT Individual Time Calculation (min): 62 min    Short Term Goals: Week 1:  OT Short Term Goal 1 (Week 1): Pt will demonstrate increased awareness to RUE by positioning appropriately to decrease shoulder subluxation OT Short Term Goal 2 (Week 1): Pt will completed squat pivot transfer to toilet with max assist of one caregiver OT Short Term Goal 3 (Week 1): Pt will complete UB dressing with mod assist OT Short Term Goal 4 (Week 1): Pt will complete LB dressing with max assist of one caregiver at sit > stand level OT Short Term Goal 5 (Week 1): Pt will complete bathing with mod assist at sit > stand level  Skilled Therapeutic Interventions/Progress Updates:    Treatment session with focus on trunk control, sit > stand, upright standing posture, incorporation of RUE into functional tasks.  Agreeable to bathing at shower level this session.  Utilized Stedy for transfer from bed into roll-in shower chair.  Pt demonstrated ability to grasp loosely onto Stedy bar with Rt hand.  Therapist positioned to pt's Rt to assist with proper positioning of RUE as support at shoulder during sit > stand.  Pt required max assist initially for sit > stand with UE support but able to progress to mod by end of session.  +2 present for dressing with therapist providing tactile cues and support to ensure upright standing while 2nd person assisted with clothing.  Utilized mirror to provide feedback for upright standing posture with noted improvements.    Therapy Documentation Precautions:  Precautions Precautions: Fall Precaution Comments: R hemi, pt on home O2 PTA for recent pneumonia (but used it PRN, not 24/7) Restrictions Weight Bearing Restrictions: No General:   Vital Signs: Therapy Vitals BP: (!) 104/58 mmHg Pain: Pain  Assessment Pain Assessment: No/denies pain  See Function Navigator for Current Functional Status.   Therapy/Group: Individual Therapy  [name] 06/24/2015, 10:54 AM

## 2015-06-24 NOTE — IPOC Note (Signed)
Overall Plan of Care [facility]) Patient Details Name: Ivan Beasley MRN: [id number] DOB: 09/27/1940  Admitting Diagnosis: [facility] Problems: Principal Problem:   ICH (intracerebral hemorrhage) ([place])- L frontal ICH Active Problems:   Mild chronic obstructive pulmonary disease (HCC)   Seizures (HCC), possible   CKD (chronic kidney disease), stage III   Right hemiparesis ([place])     Functional Problem List: Nursing Bladder, Bowel, Edema, Endurance, Medication Management, Nutrition, Pain, Sensory  PT Balance, Perception, Safety, Sensory, Endurance, Motor  OT Balance, Cognition, Endurance, Motor, Perception, Safety, Sensory, Skin Integrity, Vision  SLP Cognition, Linguistic, Safety  TR         Basic ADL's: OT Eating, Grooming, Bathing, Dressing, Toileting     Advanced  ADL's: OT       Transfers: PT Bed Mobility, Bed to Chair, Car, Manufacturing systems engineer, Metallurgist: PT Ambulation, Emergency planning/management officer, Stairs     Additional Impairments: OT Fuctional Use of Upper Extremity  SLP Communication, Social Cognition expression Social Interaction, Problem Solving, Memory, Attention, Awareness  TR      Anticipated Outcomes Item Anticipated Outcome  Self Feeding Min assist  Swallowing      Basic self-care  Min assist  Toileting  Min assist   Bathroom Transfers Min assist  Bowel/Bladder  manage bowel and bladder with min assist  Transfers  minA  Locomotion  modA gait in controlled environment; minA w/c propulsion  Communication  Supervision   Cognition  Supervision   Pain  Pain at or below level 5 with scheduled and prn meds  Safety/Judgment  Maintain safety with level 5 supervison/cues   Therapy Plan: PT Intensity: Minimum of 1-2 x/day ,45 to 90 minutes PT Frequency: 5 out of 7 days PT Duration Estimated Length of Stay: 24-27 days OT Intensity: Minimum of 1-2 x/day, 45 to 90 minutes OT Frequency: 5 out of 7 days OT Duration/Estimated  Length of Stay: 24-27 days SLP Intensity: Minumum of 1-2 x/day, 30 to 90 minutes SLP Frequency: 3 to 5 out of 7 days SLP Duration/Estimated Length of Stay: 24-27 days        Team Interventions: Nursing Interventions Patient/Family Education, Pain Management, Dysphagia/Aspiration Precaution Training, Bladder Management, Medication Management, Discharge Planning, Bowel Management, Psychosocial Support, Disease Management/Prevention, Cognitive Remediation/Compensation  PT interventions Ambulation/gait training, Training and development officer, Community reintegration, Cognitive remediation/compensation, Discharge planning, Disease management/prevention, DME/adaptive equipment instruction, Functional mobility training, Neuromuscular re-education, Patient/family education, Psychosocial support, Stair training, Therapeutic Activities, Therapeutic Exercise, UE/LE Strength taining/ROM, UE/LE Coordination activities, Visual/perceptual remediation/compensation, Wheelchair propulsion/positioning  OT Interventions Training and development officer, Cognitive remediation/compensation, Discharge planning, Disease mangement/prevention, DME/adaptive equipment instruction, Functional electrical stimulation, Functional mobility training, Neuromuscular re-education, Pain management, Patient/family education, Psychosocial support, Self Care/advanced ADL retraining, Skin care/wound managment, Splinting/orthotics, Therapeutic Activities, Therapeutic Exercise, UE/LE Strength taining/ROM, UE/LE Coordination activities, Visual/perceptual remediation/compensation, Wheelchair propulsion/positioning  SLP Interventions Cognitive remediation/compensation, English as a second language teacher, Environmental controls, Functional tasks, Internal/external aids, Medication managment, Multimodal communication approach, Patient/family education, Speech/Language facilitation, Therapeutic Exercise  TR Interventions    SW/CM Interventions Discharge Planning, Psychosocial  Support, Patient/Family Education    Team Discharge Planning: Destination: PT-Home ,OT- Home , SLP-Home Projected Follow-up: PT-Home health PT, OT-  Home health OT (vs SNF if pt's wife unable to provide physical assist), SLP-24 hour supervision/assistance, Home Health SLP Projected Equipment Needs: PT-To be determined, OT- 3 in 1 bedside comode, Tub/shower bench, To be determined, SLP-None recommended by SLP Equipment Details: PT- , OT-  Patient/family involved in discharge planning: PT- Patient, Family member/caregiver,  OT-Patient, Family member/caregiver, SLP-Family member/caregiver  MD ELOS: 20-25d Medical Rehab Prognosis:  Good Assessment: 75 y.o. 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 Per chart review patient is married and independent prior to admission and driving short distances. One level home with 2 steps to entry. Presented with 06/13/2015 to [facility] with altered mental status and right-sided weakness. CT of the head revealed left frontal intracerebral hemorrhage with subarachnoid hemorrhage. Patient's eliquis was discontinued. Patient was transferred to [facility] for further evaluation. MRI of the brain showed posterior left frontal lobe complex 2.5 x 2.2 x 2 cm hemorrhagic lesion with marked surrounding vasogenic edema with breakthrough into sulci with subarachnoid hemorrhage noted. Maintained on Keppra for seizure prophylaxis. EEG mild generalized nonspecific cerebral dysfunction consistent with encephalopathy. Recent echocardiogram with ejection fraction of 50% without embolus. Lower extremity Dopplers negative. Plan to repeat MRI for further  evaluation of possible metastases posterior left frontal lobe after ICH    Now requiring 24/7 Rehab RN,MD, as well as CIR level PT, OT and SLP.  Treatment team will focus on ADLs and mobility with goals set at [facility]  See Team Conference Notes for weekly updates to the plan of care

## 2015-06-24 NOTE — Progress Notes (Signed)
Subjective/Complaints: atient states he walked  Well with the physical therapist today. He is tired awaiting dinner Review of systems aphasic limiting review Objective: Vital Signs: Blood pressure 112/45, pulse 89, temperature 97.5 F (36.4 C), temperature source Oral, resp. rate 18, weight 91.354 kg (201 lb 6.4 oz), SpO2 99 %. No results found. Results for orders placed or performed during the hospital encounter of 06/14/2015 (from the past 72 hour(s))  CBC WITH DIFFERENTIAL     Status: Abnormal   Collection Time: 07/01/2015  5:22 PM  Result Value Ref Range   WBC 7.0 4.0 - 10.5 K/uL   RBC 3.18 (L) 4.22 - 5.81 MIL/uL   Hemoglobin 8.5 (L) 13.0 - 17.0 g/dL   HCT 27.8 (L) 39.0 - 52.0 %   MCV 87.4 78.0 - 100.0 fL   MCH 26.7 26.0 - 34.0 pg   MCHC 30.6 30.0 - 36.0 g/dL   RDW 19.0 (H) 11.5 - 15.5 %   Platelets 180 150 - 400 K/uL   Neutrophils Relative % 72 %   Neutro Abs 5.1 1.7 - 7.7 K/uL   Lymphocytes Relative 14 %   Lymphs Abs 1.0 0.7 - 4.0 K/uL   Monocytes Relative 11 %   Monocytes Absolute 0.8 0.1 - 1.0 K/uL   Eosinophils Relative 3 %   Eosinophils Absolute 0.2 0.0 - 0.7 K/uL   Basophils Relative 0 %   Basophils Absolute 0.0 0.0 - 0.1 K/uL  Comprehensive metabolic panel     Status: Abnormal   Collection Time: 06/19/2015  5:22 PM  Result Value Ref Range   Sodium 135 135 - 145 mmol/L   Potassium 3.9 3.5 - 5.1 mmol/L   Chloride 106 101 - 111 mmol/L   CO2 19 (L) 22 - 32 mmol/L   Glucose, Bld 148 (H) 65 - 99 mg/dL   BUN 25 (H) 6 - 20 mg/dL   Creatinine, Ser 1.18 0.61 - 1.24 mg/dL   Calcium 9.2 8.9 - 10.3 mg/dL   Total Protein 6.7 6.5 - 8.1 g/dL   Albumin 2.9 (L) 3.5 - 5.0 g/dL   AST 21 15 - 41 U/L   ALT 25 17 - 63 U/L   Alkaline Phosphatase 137 (H) 38 - 126 U/L   Total Bilirubin 0.4 0.3 - 1.2 mg/dL   GFR calc non Af Amer 59 (L) >60 mL/min   GFR calc Af Amer >60 >60 mL/min    Comment: (NOTE) The eGFR has been calculated using the CKD EPI equation. This calculation has not  been validated in all clinical situations. eGFR's persistently <60 mL/min signify possible Chronic Kidney Disease.    Anion gap 10 5 - 15  Glucose, capillary     Status: None   Collection Time: 06/14/2015  7:58 PM  Result Value Ref Range   Glucose-Capillary 81 65 - 99 mg/dL  C difficile quick scan w PCR reflex     Status: None   Collection Time: 07/06/2015 11:13 PM  Result Value Ref Range   C Diff antigen NEGATIVE NEGATIVE   C Diff toxin NEGATIVE NEGATIVE   C Diff interpretation Negative for toxigenic C. difficile   Glucose, capillary     Status: Abnormal   Collection Time: 07/05/2015 11:50 PM  Result Value Ref Range   Glucose-Capillary 104 (H) 65 - 99 mg/dL   Comment 1 Notify RN   Glucose, capillary     Status: Abnormal   Collection Time: 06/22/15  4:03 AM  Result Value Ref Range   Glucose-Capillary  129 (H) 65 - 99 mg/dL   Comment 1 Notify RN   Glucose, capillary     Status: Abnormal   Collection Time: 06/22/15  8:54 AM  Result Value Ref Range   Glucose-Capillary 168 (H) 65 - 99 mg/dL  Glucose, capillary     Status: Abnormal   Collection Time: 06/22/15 12:11 PM  Result Value Ref Range   Glucose-Capillary 177 (H) 65 - 99 mg/dL  Glucose, capillary     Status: Abnormal   Collection Time: 06/22/15  4:27 PM  Result Value Ref Range   Glucose-Capillary 118 (H) 65 - 99 mg/dL  Glucose, capillary     Status: Abnormal   Collection Time: 06/22/15  7:55 PM  Result Value Ref Range   Glucose-Capillary 120 (H) 65 - 99 mg/dL   Comment 1 Notify RN   Glucose, capillary     Status: Abnormal   Collection Time: 06/22/15 11:27 PM  Result Value Ref Range   Glucose-Capillary 111 (H) 65 - 99 mg/dL   Comment 1 Notify RN   Glucose, capillary     Status: Abnormal   Collection Time: 06/23/15  4:16 AM  Result Value Ref Range   Glucose-Capillary 175 (H) 65 - 99 mg/dL   Comment 1 Notify RN   Glucose, capillary     Status: Abnormal   Collection Time: 06/23/15 12:09 PM  Result Value Ref Range    Glucose-Capillary 130 (H) 65 - 99 mg/dL  Glucose, capillary     Status: None   Collection Time: 06/23/15  4:43 PM  Result Value Ref Range   Glucose-Capillary 95 65 - 99 mg/dL  Glucose, capillary     Status: Abnormal   Collection Time: 06/23/15  8:52 PM  Result Value Ref Range   Glucose-Capillary 101 (H) 65 - 99 mg/dL  Glucose, capillary     Status: None   Collection Time: 06/23/15 11:52 PM  Result Value Ref Range   Glucose-Capillary 84 65 - 99 mg/dL  Glucose, capillary     Status: Abnormal   Collection Time: 06/24/15  6:43 AM  Result Value Ref Range   Glucose-Capillary 117 (H) 65 - 99 mg/dL  Glucose, capillary     Status: Abnormal   Collection Time: 06/24/15 11:36 AM  Result Value Ref Range   Glucose-Capillary 202 (H) 65 - 99 mg/dL     HEENT: normal Cardio: irregular irregular Resp: CTA B/L and unlabored GI: BS positive and nontender nondistended Extremity:  No Edema Skin:   Intact Neuro: Flat, Abnormal Sensory cannot assess secondary to aphasia, Abnormal Motor 2 minus finger flexors, biceps and triceps , 0 at deltoid, 2 minus at the hip knee extensors as well as plantar flexors. 0 at the ankle dorsiflexors., Dysarthric and Aphasic Musc/Skel:  Normal Gen. no acute distress   Assessment/Plan: 1. 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 Physiatrist is providing close team supervision and 24 hour management of active medical problems listed below. Physiatrist and rehab team continue to assess barriers to discharge/monitor patient progress toward functional and medical goals. FIM: Function - Bathing Position: Shower Body parts bathed by patient: Chest, Abdomen, Front perineal area, Right upper leg, Left upper leg Body parts bathed by helper: Right arm, Left arm, Buttocks, Right lower leg, Left lower leg, Back Bathing not applicable:  Right upper leg Assist Level:  (Mod assist, all completed at seated level on roll-in shower  chair)  Function- Upper Body Dressing/Undressing What is the patient wearing?: Pull over shirt/dress Pull over shirt/dress - Perfomed by patient: Put head through opening Pull over shirt/dress - Perfomed by helper: Thread/unthread right sleeve, Thread/unthread left sleeve, Pull shirt over trunk Assist Level:  (Max assist) Function - Lower Body Dressing/Undressing What is the patient wearing?: Pants, Non-skid slipper socks Position: Wheelchair/chair at sink (with use of Stedy) Pants- Performed by helper: Thread/unthread right pants leg, Thread/unthread left pants leg, Pull pants up/down Non-skid slipper socks- Performed by helper: Don/doff right sock, Don/doff left sock Assist for footwear: Dependant Assist for lower body dressing: 2 Helpers  Function - Toileting Toileting activity did not occur: No continent bowel/bladder event Assist level: Two helpers (per [employer], NT)  Function - Air cabin crew transfer activity did not occur: Safety/medical concerns Assist level to toilet: 2 helpers (per Berkley Harvey, NT) Assist level from toilet: 2 helpers  Function - Chair/bed transfer Chair/bed transfer method: Lateral scoot Chair/bed transfer assist level: Moderate assist (Pt 50 - 74%/lift or lower) Chair/bed transfer assistive device: Sliding board Mechanical lift: Stedy Chair/bed transfer details: Verbal cues for precautions/safety, Manual facilitation for weight shifting, Manual facilitation for placement, Verbal cues for sequencing, Verbal cues for technique, Tactile cues for placement, Tactile cues for posture  Function - Locomotion: Wheelchair Will patient use wheelchair at discharge?: Yes Type: Manual Max wheelchair distance: 75 Assist Level: Maximal assistance (Pt 25 - 49%) Assist Level: Maximal assistance (Pt 25 - 49%) Wheel 150 feet activity did not occur: Safety/medical  concerns Turns around,maneuvers to table,bed, and toilet,negotiates 3% grade,maneuvers on rugs and over doorsills: No Function - Locomotion: Ambulation Ambulation activity did not occur: Safety/medical concerns Assistive device: Rail in hallway Max distance: 25 Assist level: Maximal assist (Pt 25 - 49%) Assist level: Maximal assist (Pt 25 - 49%)  Function - Comprehension Comprehension: Auditory Comprehension assistive device: Hearing aids Comprehension assist level: Follows basic conversation/direction with extra time/assistive device  Function - Expression Expression: Verbal Expression assist level: Expresses basic 90% of the time/requires cueing < 10% of the time.  Function - Social Interaction Social Interaction assist level: Interacts appropriately 75 - 89% of the time - Needs redirection for appropriate language or to initiate interaction.  Function - Problem Solving Problem solving assist level: Solves basic 75 - 89% of the time/requires cueing 10 - 24% of the time  Function - Memory Memory assist level: Recognizes or recalls 50 - 74% of the time/requires cueing 25 - 49% of the time Patient normally able to recall (first 3 days only): Current season    Medical Problem List and Plan: 1.  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 Plan to follow-up MRI of the brain after ICH absorbed for further evaluation of possible metastases posterior left frontal lobe             -No issues with progressive weakness. Tolerating therapy but fatigues toward the end of the day. Continue CIR PTOT speech 2.  DVT Prophylaxis/Anticoagulation: SCDs. Monitor for signs of DVT 3. Pain Management: Ultram and oxycodone as needed 4. Mood: Xanax 0.25 mg 3 times daily as needed 5. Neuropsych: This patient is capable of making decisions on his own behalf with caregiver/staff assistance. 6. Skin/Wound Care: Routine skin checks 7. Fluids/Electrolytes/Nutrition:  Routine I&O's with follow-up chemistries 8. Seizure prophylaxis. Keppra now at 1000 mg twice a day---more alert. EEG displayed seizure activity 9. Atrial fibrillation. Cardiac rate control. Continue amiodarone 200 mg daily, Cardizem  60 mg twice a day.Eliquis held secondary to [place] Filed Vitals:   06/24/15 1016 06/24/15 1335  BP: 104/58 112/45  Pulse:  89  Temp:  97.5 F (36.4 C)  Resp:  18   10. 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 This is likely the etiology of the persistent cough As noted plan follow-up MRI after ICH absorbed for further evaluation of possible metastasis 11. Diabetes mellitus with peripheral neuropathy. Hemoglobin A1c 6.8. Glucophage 1000 mg at breakfast and 500 mg supper , Generally well controlled on the current regimen Amaryl 1 mg daily. Check blood sugars before meals and at bedtime CBG (last 3) generally controlled  Recent Labs  06/23/15 2352 06/24/15 0643 06/24/15 1136  GLUCAP 84 117* 202*    12. Hyperlipidemia. Lipitor   LOS (Days) 3 A FACE TO FACE EVALUATION WAS PERFORMED  KIRSTEINS,ANDREW E 06/24/2015, 5:16 PM

## 2015-06-25 ENCOUNTER — Inpatient Hospital Stay (HOSPITAL_COMMUNITY): Payer: Medicare HMO | Admitting: Speech Pathology

## 2015-06-25 ENCOUNTER — Encounter (HOSPITAL_COMMUNITY): Payer: Medicare HMO

## 2015-06-25 ENCOUNTER — Inpatient Hospital Stay (HOSPITAL_COMMUNITY): Payer: Medicare HMO | Admitting: Physical Therapy

## 2015-06-25 ENCOUNTER — Inpatient Hospital Stay (HOSPITAL_COMMUNITY): Payer: Medicare HMO | Admitting: Occupational Therapy

## 2015-06-25 ENCOUNTER — Inpatient Hospital Stay (HOSPITAL_COMMUNITY): Payer: Medicare HMO

## 2015-06-25 DIAGNOSIS — F4323 Adjustment disorder with mixed anxiety and depressed mood: Secondary | ICD-10-CM

## 2015-06-25 DIAGNOSIS — R05 Cough: Secondary | ICD-10-CM

## 2015-06-25 LAB — GLUCOSE, CAPILLARY
GLUCOSE-CAPILLARY: 121 mg/dL — AB (ref 65–99)
Glucose-Capillary: 143 mg/dL — ABNORMAL HIGH (ref 65–99)
Glucose-Capillary: 113 mg/dL — ABNORMAL HIGH (ref 65–99)
Glucose-Capillary: 126 mg/dL — ABNORMAL HIGH (ref 65–99)

## 2015-06-25 MED ORDER — GUAIFENESIN-DM 100-10 MG/5ML PO SYRP
5.0000 mL | ORAL_SOLUTION | ORAL | Status: DC | PRN
  Administered 2015-06-25 – 2015-07-10 (×7): 5 mL via ORAL
  Filled 2015-06-25 (×10): qty 5

## 2015-06-25 MED ORDER — MUSCLE RUB 10-15 % EX CREA
TOPICAL_CREAM | CUTANEOUS | Status: DC | PRN
  Administered 2015-06-25 – 2015-07-13 (×3): via TOPICAL
  Filled 2015-06-25: qty 85

## 2015-06-25 NOTE — Progress Notes (Signed)
Physical Therapy Session Note  Patient Details  Name: Ivan Beasley MRN: [id number] Date of Birth: 10-01-1940  Today's Date: 06/25/2015 PT Individual Time: 1000-1100 PT Individual Time Calculation (min): 60 min   Short Term Goals: Week 1:  PT Short Term Goal 1 (Week 1): Pt will perform bed mobility maxA from flat bed using bedrails PT Short Term Goal 2 (Week 1): Pt will perform squat pivot transfer modA PT Short Term Goal 3 (Week 1): Pt will initiate gait training with LRAD PT Short Term Goal 4 (Week 1): Pt will perform w/c propulsion modA with L hemi technique PT Short Term Goal 5 (Week 1): Pt will demonstrate sitting balance x3 min with minA  Skilled Therapeutic Interventions/Progress Updates:    Pt received seated in w/c; denies pain and agreeable to treatment. Transfer w/c <>mat table with transfer board and maxA to R side, modA to L side.   Sitting LUE reaching for horseshoe to L side to facilitate L weight shift, L weight bearing and trunk righting. Transitioned from reaching to reaching for sit <>stand with modA overall. Mod multimodal cues in standing for hip/trunk extension.  Gait 2x25' in hallway with L handrail and maxA for RLE progression, RLE stance control and hip/knee extension. Attempted to give verbal cues to delay L stepping in order to allow for weight bearing and stance control through R side, however pt continues to progress LLE early.   Transfer w/c>recliner with stedy and modA +1. W/c cushion placed in recliner with dycem to improve ease of sit <>stand and reduce caregiver burden for later transfers. Remained in recliner with wife present and all needs in reach.   Therapy Documentation Precautions:  Precautions Precautions: Fall Precaution Comments: R hemi, pt on home O2 PTA for recent pneumonia (but used it PRN, not 24/7) Restrictions Weight Bearing Restrictions: No Pain: Pain Assessment Pain Assessment: No/denies pain   See Function Navigator for  Current Functional Status.   Therapy/Group: Individual Therapy  Luberta Mutter 06/25/2015, 11:28 AM

## 2015-06-25 NOTE — Progress Notes (Signed)
Speech Language Pathology Daily Session Note  Patient Details  Name: Ivan Beasley MRN: [id number] Date of Birth: 08-16-1940  Today's Date: 06/25/2015 SLP Individual Time: 4196-2229 SLP Individual Time Calculation (min): 27 min  Short Term Goals: Week 1: SLP Short Term Goal 1 (Week 1): Patient will recall new daily information such an meds, functions and frequency with Min verbal cues to utilize external aids SLP Short Term Goal 2 (Week 1): Patient will solve problems realted to self-care, taking into account new deficits with Min verbal cues   SLP Short Term Goal 3 (Week 1): Patient will utilize word finding strategies during structured phrase-sentence level tasks with Mod verbal cues   SLP Short Term Goal 4 (Week 1): Patient will susutain attention to functional tasks for 8-10 minutnes with Min verbal cue for redirection   Skilled Therapeutic Interventions:  Pt was seen for skilled ST targeting cognitive goals.  SLP facilitated the session by generating a written aid to facilitate recall of therapist's names, jobs, and goals of therapy.  Following initial instruction pt was able to utilize aid to locate information with min assist verbal cues and increased time. Pt was also able to utilize daily schedule to facilitate recall of events with min assist verbal cues.  Written aids posted in pt's room to facilitate carryover of information in between therapy sessions. Pt left in wheelchair with wife at bedside and call bell within reach.  Continue per current plan of care.    Function:  Eating Eating                 Cognition Comprehension Comprehension assist level: Follows basic conversation/direction with extra time/assistive device  Expression   Expression assist level: Expresses basic 90% of the time/requires cueing < 10% of the time.  Social Interaction Social Interaction assist level: Interacts appropriately 75 - 89% of the time - Needs redirection for appropriate language or  to initiate interaction.  Problem Solving Problem solving assist level: Solves basic 75 - 89% of the time/requires cueing 10 - 24% of the time  Memory Memory assist level: Recognizes or recalls 50 - 74% of the time/requires cueing 25 - 49% of the time    Pain Pain Assessment Pain Assessment: No/denies pain  Therapy/Group: Individual Therapy  Raylin Winer, [name] 06/25/2015, 11:22 AM

## 2015-06-25 NOTE — Progress Notes (Signed)
Orthopedic Tech Progress Note Patient Details:  LYNCOLN LEDGERWOOD [birth year] [id number] Called in advanced brace order; spoke with Cletis [place], Vinita Prentiss 06/25/2015, 2:39 PM

## 2015-06-25 NOTE — Progress Notes (Signed)
Orthopedic Tech Progress Note Patient Details:  Ivan Beasley [birth year] [id number] Brace order completed by hanger vendor. Patient ID: Ivan Beasley, male   DOB: 1940-12-25, 75 y.o.   MRN: [id number]   Braulio Bosch 06/25/2015, 4:25 PM

## 2015-06-25 NOTE — Progress Notes (Signed)
Occupational Therapy Session Note  Patient Details  Name: Ivan Beasley MRN: [id number] Date of Birth: [birth year]  Today's Date: 06/25/2015 OT Individual Time: [phone] and 1350-1435 OT Individual Time Calculation (min): 63 min and 45 min   Short Term Goals: Week 1:  OT Short Term Goal 1 (Week 1): Pt will demonstrate increased awareness to RUE by positioning appropriately to decrease shoulder subluxation OT Short Term Goal 2 (Week 1): Pt will completed squat pivot transfer to toilet with max assist of one caregiver OT Short Term Goal 3 (Week 1): Pt will complete UB dressing with mod assist OT Short Term Goal 4 (Week 1): Pt will complete LB dressing with max assist of one caregiver at sit > stand level OT Short Term Goal 5 (Week 1): Pt will complete bathing with mod assist at sit > stand level  Skilled Therapeutic Interventions/Progress Updates:    1)Treatment session with focus on sitting balance, transfers, and RUE NMR.  Pt in bed upon arrival finishing breakfast.  Pt declined bathing this session but did request barrier cream to buttocks.  Completed perineal hygiene at bed level with rolling Rt and Lt. Mod assist when rolling to Lt and tactile cues to assist with positioning and min assist when rolling to Rt.  Squat pivot +2 for bed > w/c with manual facilitation for anterior weight shift and positioning of RUE and RLE.  Transferred onto therapy mat with use of slide board and max assist.  Providing tactile cues for anterior weight shift and increased time to allow pt to initiate movement.  Utilized UE Ranger in sitting with focus on activation of shoulder flexion and elbow flexion.  Therapist provided tactile cues to decrease compensatory movements and target to reach for to promote movements.  Pt demonstrating increased grasp this session but continues to have decreased release. Transferred back to w/c without use of slide board with improved initiation of weight shift and lift off of  buttocks.   2) Treatment session with focus on trunk control and RUE NMR.  Pt in bed asleep upon arrival but easily aroused to participate.  Max cues for sequencing of bed mobility to come to sitting at EOB, most likely due to fatigue.  Provided pt and pt's wife HEP handout to address Rt hand movements with focus on grasp and release, finger abduction/adduction, and finger extension.  Pt required increased wait time for initiation of movement, demonstrating increased grasp and thumb abduction with visual cues and feedback.  Required tactile cues and hand over hand to elicit full finger extension as well as abduction and adduction.  Encouraged pt's wife to assist pt with exercises to continue to increase motor recovery.  Pt returned to bed and left in sidelying position with all needs in reach.  Therapy Documentation Precautions:  Precautions Precautions: Fall Precaution Comments: R hemi, pt on home O2 PTA for recent pneumonia (but used it PRN, not 24/7) Restrictions Weight Bearing Restrictions: No General:   Vital Signs: Therapy Vitals BP: (!) 116/56 mmHg Pain:  Pt with no c/o pain  See Function Navigator for Current Functional Status.   Therapy/Group: Individual Therapy  [name] 06/25/2015, 9:39 AM

## 2015-06-25 NOTE — Progress Notes (Signed)
Subjective/Complaints: Complains of cough, no chest pains Review of systems aphasic limiting review Objective: Vital Signs: Blood pressure 116/56, pulse 88, temperature 97.7 F (36.5 C), temperature source Oral, resp. rate 18, weight 89.903 kg (198 lb 3.2 oz), SpO2 96 %. No results found. Results for orders placed or performed during the hospital encounter of 07/03/2015 (from the past 72 hour(s))  Glucose, capillary     Status: Abnormal   Collection Time: 06/22/15  8:54 AM  Result Value Ref Range   Glucose-Capillary 168 (H) 65 - 99 mg/dL  Glucose, capillary     Status: Abnormal   Collection Time: 06/22/15 12:11 PM  Result Value Ref Range   Glucose-Capillary 177 (H) 65 - 99 mg/dL  Glucose, capillary     Status: Abnormal   Collection Time: 06/22/15  4:27 PM  Result Value Ref Range   Glucose-Capillary 118 (H) 65 - 99 mg/dL  Glucose, capillary     Status: Abnormal   Collection Time: 06/22/15  7:55 PM  Result Value Ref Range   Glucose-Capillary 120 (H) 65 - 99 mg/dL   Comment 1 Notify RN   Glucose, capillary     Status: Abnormal   Collection Time: 06/22/15 11:27 PM  Result Value Ref Range   Glucose-Capillary 111 (H) 65 - 99 mg/dL   Comment 1 Notify RN   Glucose, capillary     Status: Abnormal   Collection Time: 06/23/15  4:16 AM  Result Value Ref Range   Glucose-Capillary 175 (H) 65 - 99 mg/dL   Comment 1 Notify RN   Glucose, capillary     Status: Abnormal   Collection Time: 06/23/15 12:09 PM  Result Value Ref Range   Glucose-Capillary 130 (H) 65 - 99 mg/dL  Glucose, capillary     Status: None   Collection Time: 06/23/15  4:43 PM  Result Value Ref Range   Glucose-Capillary 95 65 - 99 mg/dL  Glucose, capillary     Status: Abnormal   Collection Time: 06/23/15  8:52 PM  Result Value Ref Range   Glucose-Capillary 101 (H) 65 - 99 mg/dL  Glucose, capillary     Status: None   Collection Time: 06/23/15 11:52 PM  Result Value Ref Range   Glucose-Capillary 84 65 - 99 mg/dL   Glucose, capillary     Status: Abnormal   Collection Time: 06/24/15  6:43 AM  Result Value Ref Range   Glucose-Capillary 117 (H) 65 - 99 mg/dL  Glucose, capillary     Status: Abnormal   Collection Time: 06/24/15 11:36 AM  Result Value Ref Range   Glucose-Capillary 202 (H) 65 - 99 mg/dL  Glucose, capillary     Status: Abnormal   Collection Time: 06/24/15  4:33 PM  Result Value Ref Range   Glucose-Capillary 116 (H) 65 - 99 mg/dL  Glucose, capillary     Status: None   Collection Time: 06/24/15  8:26 PM  Result Value Ref Range   Glucose-Capillary 96 65 - 99 mg/dL  Glucose, capillary     Status: Abnormal   Collection Time: 06/25/15  6:45 AM  Result Value Ref Range   Glucose-Capillary 121 (H) 65 - 99 mg/dL     HEENT: normal Cardio: irregular irregular Resp: CTA B/L and unlabored GI: BS positive and nontender nondistended Extremity:  No Edema Skin:   Intact Neuro: Flat, Abnormal Sensory cannot assess secondary to aphasia, Abnormal Motor 2 minus finger flexors, biceps and triceps , 0 at deltoid, 2 minus at the hip knee extensors as  well as plantar flexors. 0 at the ankle dorsiflexors., Dysarthric and Aphasic Musc/Skel:  Normal Gen. no acute distress   Assessment/Plan: 1. 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 Physiatrist is providing close team supervision and 24 hour management of active medical problems listed below. Physiatrist and rehab team continue to assess barriers to discharge/monitor patient progress toward functional and medical goals. FIM: Function - Bathing Position: Shower Body parts bathed by patient: Chest, Abdomen, Front perineal area, Right upper leg, Left upper leg Body parts bathed by helper: Right arm, Left arm, Buttocks, Right lower leg, Left lower leg, Back Bathing not applicable: Right upper leg Assist Level:   (Mod assist, all completed at seated level on roll-in shower chair)  Function- Upper Body Dressing/Undressing What is the patient wearing?: Pull over shirt/dress Pull over shirt/dress - Perfomed by patient: Put head through opening Pull over shirt/dress - Perfomed by helper: Thread/unthread right sleeve, Thread/unthread left sleeve, Pull shirt over trunk Assist Level:  (Max assist) Function - Lower Body Dressing/Undressing What is the patient wearing?: Pants, Non-skid slipper socks Position: Wheelchair/chair at sink (with use of Stedy) Pants- Performed by helper: Thread/unthread right pants leg, Thread/unthread left pants leg, Pull pants up/down Non-skid slipper socks- Performed by helper: Don/doff right sock, Don/doff left sock Assist for footwear: Dependant Assist for lower body dressing: 2 Helpers  Function - Toileting Toileting activity did not occur: No continent bowel/bladder event Assist level: Two helpers (per [employer], NT)  Function - Air cabin crew transfer activity did not occur: Safety/medical concerns Assist level to toilet: 2 helpers (per Berkley Harvey, NT) Assist level from toilet: 2 helpers  Function - Chair/bed transfer Chair/bed transfer method: Lateral scoot Chair/bed transfer assist level: Moderate assist (Pt 50 - 74%/lift or lower) Chair/bed transfer assistive device: Sliding board Mechanical lift: Stedy Chair/bed transfer details: Verbal cues for precautions/safety, Manual facilitation for weight shifting, Manual facilitation for placement, Verbal cues for sequencing, Verbal cues for technique, Tactile cues for placement, Tactile cues for posture  Function - Locomotion: Wheelchair Will patient use wheelchair at discharge?: Yes Type: Manual Max wheelchair distance: 75 Assist Level: Maximal assistance (Pt 25 - 49%) Assist Level: Maximal assistance (Pt 25 - 49%) Wheel 150 feet activity did not occur: Safety/medical concerns Turns around,maneuvers  to table,bed, and toilet,negotiates 3% grade,maneuvers on rugs and over doorsills: No Function - Locomotion: Ambulation Ambulation activity did not occur: Safety/medical concerns Assistive device: Rail in hallway Max distance: 25 Assist level: Maximal assist (Pt 25 - 49%) Assist level: Maximal assist (Pt 25 - 49%)  Function - Comprehension Comprehension: Auditory Comprehension assistive device: Hearing aids Comprehension assist level: Follows basic conversation/direction with extra time/assistive device  Function - Expression Expression: Verbal Expression assist level: Expresses basic 90% of the time/requires cueing < 10% of the time.  Function - Social Interaction Social Interaction assist level: Interacts appropriately 75 - 89% of the time - Needs redirection for appropriate language or to initiate interaction.  Function - Problem Solving Problem solving assist level: Solves basic 75 - 89% of the time/requires cueing 10 - 24% of the time  Function - Memory Memory assist level: Recognizes or recalls 50 - 74% of the time/requires cueing 25 - 49% of the time Patient normally able to recall (first 3 days only): Current season    Medical Problem List and Plan: 1.  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 Plan to follow-up MRI of the brain after ICH absorbed for further evaluation of possible metastases posterior left frontal lobe             - Continue CIR PTOT speech 2.  DVT Prophylaxis/Anticoagulation: SCDs. Monitor for signs of DVT 3. Pain Management: Ultram and oxycodone as needed 4. Mood: Xanax 0.25 mg 3 times daily as needed 5. Neuropsych: This patient is capable of making decisions on his own behalf with caregiver/staff assistance. 6. Skin/Wound Care: Routine skin checks 7. Fluids/Electrolytes/Nutrition: Routine I&O's with follow-up chemistries 8. Seizure prophylaxis. Keppra now at 1000 mg twice a day---more alert. EEG displayed  seizure activity 9. Atrial fibrillation. Cardiac rate control. Continue amiodarone 200 mg daily, Cardizem 60 mg twice a day.Eliquis held secondary to [place]:   06/25/15 0511 06/25/15 0753  BP: 124/59 116/56  Pulse: 88   Temp: 97.7 F (36.5 C)   Resp: 18    10. 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 This is likely the etiology of the persistent cough, Right lung nodules, right lower lobe has some rales will recheck x-ray, Will change to Robitussin-DM cough syrup as well as uses BenGay for muscle pain related to cough As noted plan follow-up MRI after ICH absorbed for further evaluation of possible metastasis 11. Diabetes mellitus with peripheral neuropathy. Hemoglobin A1c 6.8. Glucophage 1000 mg at breakfast and 500 mg supper , Generally well controlled on the current regimen Amaryl 1 mg daily. Check blood sugars before meals and at bedtime CBG (last 3) generally controlled  Recent Labs  06/24/15 1633 06/24/15 2026 06/25/15 0645  GLUCAP 116* 96 121*    12. Hyperlipidemia. Lipitor   LOS (Days) 4 A FACE TO FACE EVALUATION WAS PERFORMED  KIRSTEINS,ANDREW E 06/25/2015, 8:15 AM

## 2015-06-26 ENCOUNTER — Inpatient Hospital Stay (HOSPITAL_COMMUNITY): Payer: Medicare HMO | Admitting: Physical Therapy

## 2015-06-26 ENCOUNTER — Inpatient Hospital Stay (HOSPITAL_COMMUNITY): Payer: Medicare HMO

## 2015-06-26 ENCOUNTER — Inpatient Hospital Stay (HOSPITAL_COMMUNITY): Payer: Medicare HMO | Admitting: Speech Pathology

## 2015-06-26 LAB — CBC
HEMATOCRIT: 26.5 % — AB (ref 39.0–52.0)
Hemoglobin: 8.1 g/dL — ABNORMAL LOW (ref 13.0–17.0)
MCH: 26.4 pg (ref 26.0–34.0)
MCHC: 30.6 g/dL (ref 30.0–36.0)
MCV: 86.3 fL (ref 78.0–100.0)
PLATELETS: 270 10*3/uL (ref 150–400)
RBC: 3.07 MIL/uL — AB (ref 4.22–5.81)
RDW: 18.2 % — ABNORMAL HIGH (ref 11.5–15.5)
WBC: 7 10*3/uL (ref 4.0–10.5)

## 2015-06-26 LAB — URINALYSIS, ROUTINE W REFLEX MICROSCOPIC
BILIRUBIN URINE: NEGATIVE
GLUCOSE, UA: NEGATIVE mg/dL
HGB URINE DIPSTICK: NEGATIVE
Ketones, ur: NEGATIVE mg/dL
Nitrite: POSITIVE — AB
Protein, ur: NEGATIVE mg/dL
SPECIFIC GRAVITY, URINE: 1.015 (ref 1.005–1.030)
pH: 5.5 (ref 5.0–8.0)

## 2015-06-26 LAB — COMPREHENSIVE METABOLIC PANEL
ALT: 19 U/L (ref 17–63)
AST: 19 U/L (ref 15–41)
Albumin: 3.2 g/dL — ABNORMAL LOW (ref 3.5–5.0)
Alkaline Phosphatase: 114 U/L (ref 38–126)
Anion gap: 10 (ref 5–15)
BILIRUBIN TOTAL: 0.3 mg/dL (ref 0.3–1.2)
BUN: 17 mg/dL (ref 6–20)
CHLORIDE: 104 mmol/L (ref 101–111)
CO2: 22 mmol/L (ref 22–32)
CREATININE: 1.26 mg/dL — AB (ref 0.61–1.24)
Calcium: 9.4 mg/dL (ref 8.9–10.3)
GFR calc Af Amer: >60 mL/min (ref >60)
GFR, EST NON AFRICAN AMERICAN: 54 mL/min — AB (ref >60)
GLUCOSE: 78 mg/dL (ref 65–99)
Potassium: 4.2 mmol/L (ref 3.5–5.1)
Sodium: 136 mmol/L (ref 135–145)
Total Protein: 6.8 g/dL (ref 6.5–8.1)

## 2015-06-26 LAB — URINE MICROSCOPIC-ADD ON

## 2015-06-26 LAB — GLUCOSE, CAPILLARY
GLUCOSE-CAPILLARY: 85 mg/dL (ref 65–99)
Glucose-Capillary: 130 mg/dL — ABNORMAL HIGH (ref 65–99)
Glucose-Capillary: 170 mg/dL — ABNORMAL HIGH (ref 65–99)
Glucose-Capillary: 86 mg/dL (ref 65–99)
Glucose-Capillary: 88 mg/dL (ref 65–99)

## 2015-06-26 LAB — PROTIME-INR
INR: 1.12 (ref 0.00–1.49)
Prothrombin Time: 14.6 seconds (ref 11.6–15.2)

## 2015-06-26 LAB — APTT: aPTT: 31 seconds (ref 24–37)

## 2015-06-26 LAB — LACTIC ACID, PLASMA: LACTIC ACID, VENOUS: 2.1 mmol/L — AB (ref 0.5–2.0)

## 2015-06-26 MED ORDER — SODIUM CHLORIDE 0.9 % IV BOLUS (SEPSIS)
1000.0000 mL | Freq: Once | INTRAVENOUS | Status: AC
  Administered 2015-06-26: 1000 mL via INTRAVENOUS

## 2015-06-26 MED ORDER — PIPERACILLIN-TAZOBACTAM 3.375 G IVPB
3.3750 g | Freq: Three times a day (TID) | INTRAVENOUS | Status: DC
  Administered 2015-06-27 – 2015-06-28 (×4): 3.375 g via INTRAVENOUS
  Filled 2015-06-26 (×6): qty 50

## 2015-06-26 MED ORDER — SODIUM CHLORIDE 0.9 % IV BOLUS (SEPSIS)
500.0000 mL | Freq: Once | INTRAVENOUS | Status: AC
  Administered 2015-06-26: 500 mL via INTRAVENOUS

## 2015-06-26 MED ORDER — SODIUM CHLORIDE 0.9 % IV SOLN
INTRAVENOUS | Status: DC
  Administered 2015-06-26 – 2015-06-27 (×2): via INTRAVENOUS

## 2015-06-26 MED ORDER — PIPERACILLIN-TAZOBACTAM 3.375 G IVPB 30 MIN
3.3750 g | Freq: Once | INTRAVENOUS | Status: AC
  Administered 2015-06-26: 3.375 g via INTRAVENOUS
  Filled 2015-06-26: qty 50

## 2015-06-26 MED ORDER — SODIUM CHLORIDE 0.9% FLUSH
10.0000 mL | INTRAVENOUS | Status: DC | PRN
  Administered 2015-06-26: 20 mL
  Administered 2015-06-27 – 2015-07-04 (×4): 10 mL
  Administered 2015-07-04: 20 mL
  Administered 2015-07-04 – 2015-07-11 (×7): 10 mL
  Filled 2015-06-26 (×13): qty 40

## 2015-06-26 MED ORDER — SODIUM CHLORIDE 0.9 % IV BOLUS (SEPSIS)
1000.0000 mL | Freq: Once | INTRAVENOUS | Status: AC
  Administered 2015-06-27: 1000 mL via INTRAVENOUS

## 2015-06-26 MED ORDER — VANCOMYCIN HCL IN DEXTROSE 1-5 GM/200ML-% IV SOLN
1000.0000 mg | Freq: Once | INTRAVENOUS | Status: DC
  Filled 2015-06-26: qty 200

## 2015-06-26 MED ORDER — VANCOMYCIN HCL 10 G IV SOLR
1500.0000 mg | INTRAVENOUS | Status: DC
  Administered 2015-06-27: 1500 mg via INTRAVENOUS
  Filled 2015-06-26 (×2): qty 1500

## 2015-06-26 MED ORDER — GADOBENATE DIMEGLUMINE 529 MG/ML IV SOLN
20.0000 mL | Freq: Once | INTRAVENOUS | Status: AC | PRN
  Administered 2015-06-26: 19 mL via INTRAVENOUS

## 2015-06-26 MED ORDER — DIPHENOXYLATE-ATROPINE 2.5-0.025 MG PO TABS: 1.0000 | ORAL_TABLET | Freq: Four times a day (QID) | ORAL | Status: DC | PRN

## 2015-06-26 MED ORDER — SODIUM CHLORIDE 0.9 % IV SOLN
1750.0000 mg | Freq: Once | INTRAVENOUS | Status: AC
  Administered 2015-06-27: 1750 mg via INTRAVENOUS
  Filled 2015-06-26: qty 1750

## 2015-06-26 NOTE — Progress Notes (Signed)
Physical Therapy Session Note  Patient Details  Name: Ivan Beasley MRN: [id number] Date of Birth: Aug 13, 1940  Today's Date: 06/26/2015 PT Individual Time: 1300-1301 PT Individual Time Calculation (min): 1 min   Short Term Goals: Week 1:  PT Short Term Goal 1 (Week 1): Pt will perform bed mobility maxA from flat bed using bedrails PT Short Term Goal 2 (Week 1): Pt will perform squat pivot transfer modA PT Short Term Goal 3 (Week 1): Pt will initiate gait training with LRAD PT Short Term Goal 4 (Week 1): Pt will perform w/c propulsion modA with L hemi technique PT Short Term Goal 5 (Week 1): Pt will demonstrate sitting balance x3 min with minA  Skilled Therapeutic Interventions/Progress Updates:    Patient received supine in bed with wife present. [name] states that he is not doing well with decline mobility and verbalization. PT noted patient to have ptosis on R side of face and unable to smile or raise R eye brow. RN notified and reports that MD has already been notified and CT scan has been ordered. PT will hold therapy at this time and re-attempt if patient is medically stable.    Therapy Documentation Precautions:  Precautions Precautions: Fall Precaution Comments: R hemi, pt on home O2 PTA for recent pneumonia (but used it PRN, not 24/7) Restrictions Weight Bearing Restrictions: No General: PT Amount of Missed Time (min): 60 Minutes PT Missed Treatment Reason: MD hold (Comment) (declined status; CT ordered) Vital Signs: Therapy Vitals Pulse Rate: 91 BP: 134/63 mmHg Oxygen Therapy SpO2: 98 % O2 Device: Not Delivered Pain: Pain Assessment Pain Assessment: No/denies pain   See Function Navigator for Current Functional Status.   Therapy/Group: Individual Therapy  Lorie Phenix 06/26/2015, 1:16 PM

## 2015-06-26 NOTE — Progress Notes (Signed)
Restless night R/t  Cough and several loose stools. Patient reports non-productive, congestive cough. Right PRAFO in place. Ivan Beasley

## 2015-06-26 NOTE — Significant Event (Signed)
Rapid Response Event Note  Overview:  Called by primary RN for patient with worsening slurred speech and facial droop.  Time Called: 1250 Arrival Time: 1255 Event Type: Neurologic  Initial Focused Assessment: Upon my arrival to patients room, RN and wife at bedside.  As per Rn patient was being cleaned up and after patient was noticed to have increased slurred speech and inability to get words out, and increased facial droop.  VSS.  Pateint is very emotional and tearful at this time. Skin is warm and dry   Interventions: Recommend calling MD and updating-suggest a repeat CT scan of head.  Plan of Care (if not transferred):  Patient to have CT scan of head and to call MD with results.  Rn to call if assistance needed   Event Summary:   at      at          Ivan Beasley

## 2015-06-26 NOTE — Progress Notes (Signed)
ANTIBIOTIC CONSULT NOTE - INITIAL  Pharmacy Consult:  Vancomycin / Zosyn Indication:  Sepsis  No Known Allergies  Patient Measurements: Weight: 199 lb 4.7 oz (90.4 kg)  Vital Signs: Temp: 97.8 F (36.6 C) (06/17 1817) Temp Source: Oral (06/17 1817) BP: 120/58 mmHg (06/17 1817) Pulse Rate: 86 (06/17 1817) Intake/Output from previous day: 06/16 0701 - 06/17 0700 In: 960 [P.O.:960] Out: -   Labs:  Recent Labs  06/26/15 1806  WBC 7.0  HGB 8.1*  PLT 270  CREATININE 1.26*   Estimated Creatinine Clearance: 55.6 mL/min (by C-G formula based on Cr of 1.26). No results for input(s): VANCOTROUGH, VANCOPEAK, VANCORANDOM, GENTTROUGH, GENTPEAK, GENTRANDOM, TOBRATROUGH, TOBRAPEAK, TOBRARND, AMIKACINPEAK, AMIKACINTROU, AMIKACIN in the last 72 hours.   Microbiology: Recent Results (from the past 720 hour(s))  MRSA PCR Screening     Status: None   Collection Time: 06/13/15  3:31 PM  Result Value Ref Range Status   MRSA by PCR NEGATIVE NEGATIVE Final    Comment:        The GeneXpert MRSA Assay (FDA approved for NASAL specimens only), is one component of a comprehensive MRSA colonization surveillance program. It is not intended to diagnose MRSA infection nor to guide or monitor treatment for MRSA infections.   Culture, blood (Routine X 2) w Reflex to ID Panel     Status: None   Collection Time: 06/14/15 11:13 AM  Result Value Ref Range Status   Specimen Description BLOOD LEFT HAND  Final   Special Requests BOTTLES DRAWN AEROBIC AND ANAEROBIC 5CC  Final   Culture NO GROWTH 5 DAYS  Final   Report Status 06/19/2015 FINAL  Final  Culture, blood (Routine X 2) w Reflex to ID Panel     Status: None   Collection Time: 06/14/15  1:10 PM  Result Value Ref Range Status   Specimen Description BLOOD RIGHT HAND  Final   Special Requests BOTTLES DRAWN AEROBIC AND ANAEROBIC 5CC  Final   Culture NO GROWTH 5 DAYS  Final   Report Status 06/19/2015 FINAL  Final  Urine culture     Status:  None   Collection Time: 06/14/15  2:30 PM  Result Value Ref Range Status   Specimen Description URINE, CATHETERIZED  Final   Special Requests NONE  Final   Culture NO GROWTH  Final   Report Status 06/15/2015 FINAL  Final  C difficile quick scan w PCR reflex     Status: None   Collection Time: 07/05/2015 11:13 PM  Result Value Ref Range Status   C Diff antigen NEGATIVE NEGATIVE Final   C Diff toxin NEGATIVE NEGATIVE Final   C Diff interpretation Negative for toxigenic C. difficile  Final    Medical History: Past Medical History  Diagnosis Date  . Coronary artery disease   . Pneumonia 2000  . Arthritis   . 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 Bruises easily   . GERD (gastroesophageal reflux disease)     takes Omeprazole daily as needed for stomach pain  . Blood transfusion 2001  . Diabetes mellitus     takes Metformin daily;  . Impaired hearing     bil hearing aide  . Macular degeneration     being watched for this but hasn't been "completely" diagnosed  . Myocardial infarction ([place]) 2001  . Cancer [facility])       Assessment: 85 YOM with recent ICH/SAH while on Eliquis.  He also has a history of lung  cancer s/p chemotherapy and XRT.  Now with sepsis and Pharmacy consulted to initiate vancomycin and Zosyn.  Baseline labs reviewed.   Goal of Therapy:  Vancomycin trough level 15-20 mcg/ml   Plan:  - Change vanc to '1750mg'$  IV x 1, then '1500mg'$  IV Q24H - Zosyn 3.375gm IV Q8H, 4 hr infusion - Monitor renal fxn, clinical progress, vanc trough as indicated   Anastasija Anfinson D. Mina Marble, PharmD, BCPS Pager:  [phone] 06/26/2015, 8:15 PM

## 2015-06-26 NOTE — Progress Notes (Signed)
Ivan Beasley is a 75 y.o. male Feb 19, 1940 [id number]  Subjective: C/o chronic cough. 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   Objective: Vital signs in last 24 hours: Temp:  [97.3 F (36.3 C)-97.8 F (36.6 C)] 97.8 F (36.6 C) (06/17 0417) Pulse Rate:  [89-94] 91 (06/17 1257) Resp:  [17-18] 17 (06/17 0417) BP: (112-134)/(44-63) 134/63 mmHg (06/17 1257) SpO2:  [96 %-98 %] 98 % (06/17 1257) Weight:  [199 lb 4.7 oz (90.4 kg)] 199 lb 4.7 oz (90.4 kg) (06/17 0417) Weight change: 1 lb 1.5 oz (0.497 kg) Last BM Date: 06/25/15  Intake/Output from previous day: 06/16 0701 - 06/17 0700 In: 39 [P.O.:960] Out: -  Last cbgs: CBG (last 3)   Recent Labs  06/25/15 2137 06/26/15 0654 06/26/15 1154  GLUCAP 126* 130* 170*     Physical Exam General: No apparent distress  Appears chronically ill HEENT: not dry Lungs: Normal effort. Lungs w/coarse BS, no crackles. Cardiovascular: Regular rate and rhythm, no LE edema Abdomen: S/NT/ND; BS(+) Musculoskeletal:  unchanged Neurological: No new neurological deficits Wounds: N/A    Skin: clear  Aging changes Mental state: Alert,  cooperative    Lab Results: BMET    Component Value Date/Time   NA 135 07/06/2015 1722   NA 134* 04/30/2014 0938   K 3.9 06/14/2015 1722   K 5.0 04/30/2014 0938   CL 106 07/03/2015 1722   CL 102 04/30/2014 0938   CO2 19* 07/05/2015 1722   CO2 25 04/30/2014 0938   GLUCOSE 148* 06/14/2015 1722   GLUCOSE 225* 04/30/2014 0938   BUN 25* 06/26/2015 1722   BUN 25* 04/30/2014 0938   CREATININE 1.18 07/06/2015 1722   CREATININE 1.20 04/30/2014 0938   CALCIUM 9.2 07/01/2015 1722   CALCIUM 9.2 04/30/2014 0938   GFRNONAA 59* 06/20/2015 1722   GFRNONAA 59* 04/30/2014 0938   GFRAA >60 06/15/2015 1722   GFRAA >60 04/30/2014 0938   CBC    Component Value Date/Time   WBC 7.0 06/16/2015 1722   WBC 8.0 04/30/2014 0938   RBC 3.18* 06/20/2015 1722   RBC 4.33* 04/30/2014  0938   HGB 8.5* 06/23/2015 1722   HGB 12.2* 04/30/2014 0938   HCT 27.8* 06/30/2015 1722   HCT 37.4* 04/30/2014 0938   PLT 180 07/04/2015 1722   PLT 158 04/30/2014 0938   MCV 87.4 07/06/2015 1722   MCV 86 04/30/2014 0938   MCH 26.7 06/16/2015 1722   MCH 28.1 04/30/2014 0938   MCHC 30.6 06/26/2015 1722   MCHC 32.6 04/30/2014 0938   RDW 19.0* 06/14/2015 1722   RDW 16.1* 04/30/2014 0938   LYMPHSABS 1.0 06/28/2015 1722   LYMPHSABS 1.5 04/30/2014 0938   MONOABS 0.8 06/28/2015 1722   MONOABS 1.1* 04/30/2014 0938   EOSABS 0.2 07/07/2015 1722   EOSABS 0.3 04/30/2014 0938   BASOSABS 0.0 07/06/2015 1722   BASOSABS 0.0 04/30/2014 0938    Studies/Results: Dg Chest 2 View  06/25/2015  CLINICAL DATA:  Cough, left-sided weakness EXAM: CHEST  2 VIEW COMPARISON:  06/18/2015 FINDINGS: Cardiomediastinal silhouette is stable. Right IJ Port-A-Cath is unchanged in position. Persistent left upper lobe consolidation. Bony thorax is unremarkable. IMPRESSION: No significant change. Persistent left upper lobe consolidation. Stable right IJ Port-A-Cath position. Electronically Signed   By: Lahoma Crocker M.D.   On: 06/25/2015 16:54    Medications: I have reviewed the patient's current medications.  Assessment/Plan:  1. 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 Plan to follow-up MRI of the brain after ICH absorbed for further evaluation of possible metastases posterior left frontal lobe - Continue CIR PTOT speech 2. DVT Prophylaxis/Anticoagulation: SCDs.  3. Pain Management: Ultram and oxycodone as needed 4. Mood: Xanax 0.25 mg 3 times daily as needed 5. Neuropsych: This patient is capable of making decisions on his own behalf with caregiver/staff assistance. 6. Skin/Wound Care: Routine skin checks 7. Fluids/Electrolytes/Nutrition: Routine I&O's with follow-up chemistries 8. Seizure prophylaxis. Keppra now at 1000 mg twice a day---more alert. EEG  displayed seizure activity 9. Atrial fibrillation. Cardiac rate control. Continue amiodarone 200 mg daily, Cardizem 60 mg twice a day.Eliquis held secondary to [place] 10. 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 This is likely the etiology of the persistent cough, Right lung nodules, right lower lobe has some rales will recheck x-ray, Will change to Robitussin-DM cough syrup as well as uses BenGay for muscle pain related to cough. 11. Diabetes mellitus with peripheral neuropathy. Hemoglobin A1c 6.8. Glucophage 1000 mg at breakfast and 500 mg supper: generally well controlled on the current regimen Amaryl 1 mg daily. Check blood sugars before meals and at bedtime 12. Hyperlipidemia. Lipitor    13. Incontinence to stool: treat diarrhea     Length of stay, days: 5  Walker Kehr , MD 06/26/2015, 2:01 PM

## 2015-06-26 NOTE — Progress Notes (Signed)
Patient vitals stable this morning. Patient ate breakfast and took all pills at once whole with water. About 1230 nurse called to room, patient had incontinent episode. Wife at bedside and assisted changing patient. Patient had a large incontinent loose stool and incontinent urine. After cleaning up patient noted to be very emotional/tearful but denied pain. Patient speech more garbled/slurred and Rt side facial droop more noticeable. Rapid Response called and MD Swords notified. CT of head done. Results called to MD. Continued to monitor. Nurse attempted to give patient a pill in the afternoon but patient would hold pill in mouth and unable to drink out of the straw. MD re notified about symptoms. New orders received. IV placed and fluids given. Vital signs stable this evening. Patient speech appears slightly better at shift change but not at baseline from am. Report given to night RN.

## 2015-06-26 NOTE — Progress Notes (Signed)
Speech Language Pathology Daily Session Note  Patient Details  Name: Ivan Beasley MRN: [id number] Date of Birth: 07-29-1940  Today's Date: 06/26/2015 SLP Individual Time: 4010-2725 SLP Individual Time Calculation (min): 30 min  Short Term Goals: Week 1: SLP Short Term Goal 1 (Week 1): Patient will recall new daily information such an meds, functions and frequency with Min verbal cues to utilize external aids SLP Short Term Goal 2 (Week 1): Patient will solve problems realted to self-care, taking into account new deficits with Min verbal cues   SLP Short Term Goal 3 (Week 1): Patient will utilize word finding strategies during structured phrase-sentence level tasks with Mod verbal cues   SLP Short Term Goal 4 (Week 1): Patient will susutain attention to functional tasks for 8-10 minutnes with Min verbal cue for redirection   Skilled Therapeutic Interventions: Skilled treatment session focused on cognition goals. Pt eating breakfast when SLP arrived. [name] feeding pt. SLP facilitated session by providing supervisional cues for pt to problem solve self feeding task. With supervision cues, pt able to self feed himself. When pt's wife was feeding him, he exhibited cough during some consumption (wife states that he has had cough for greater than a year) however when he feed himself, he didn't cough. Education provided to allow pt to provide as much care for himself as possible. Pt required Mod A verbal cues to refer to schedule for information about therapy and goals. After pt located schedule, he answered mildly complex Ila questions with Min A verbal cues. Pt able to sustain attention to task for ~15 minutes with only supervision cues. Pt left in bed with wife present and all needs within reach. Continue current all of care.   Function:  Eating Eating   Modified Consistency Diet: No Eating Assist Level: Assistive Device;More than reasonable amount of time;Set up assist for   Eating Set Up  Assist For: Opening containers;Cutting food;Applying device (includes dentures)       Cognition Comprehension Comprehension assist level: Follows complex conversation/direction with no assist  Expression   Expression assist level: Expresses basic 90% of the time/requires cueing < 10% of the time.  Social Interaction Social Interaction assist level: Interacts appropriately 75 - 89% of the time - Needs redirection for appropriate language or to initiate interaction.  Problem Solving Problem solving assist level: Solves basic 75 - 89% of the time/requires cueing 10 - 24% of the time  Memory Memory assist level: Recognizes or recalls 50 - 74% of the time/requires cueing 25 - 49% of the time    Pain    Therapy/Group: Individual Therapy  Meleni Delahunt 06/26/2015, 10:35 AM

## 2015-06-27 ENCOUNTER — Inpatient Hospital Stay (HOSPITAL_COMMUNITY): Payer: Medicare HMO | Admitting: Physical Therapy

## 2015-06-27 ENCOUNTER — Inpatient Hospital Stay (HOSPITAL_COMMUNITY): Payer: Medicare HMO | Admitting: Occupational Therapy

## 2015-06-27 DIAGNOSIS — N390 Urinary tract infection, site not specified: Secondary | ICD-10-CM

## 2015-06-27 DIAGNOSIS — T83518A Infection and inflammatory reaction due to other urinary catheter, initial encounter: Secondary | ICD-10-CM

## 2015-06-27 LAB — GLUCOSE, CAPILLARY
GLUCOSE-CAPILLARY: 116 mg/dL — AB (ref 65–99)
GLUCOSE-CAPILLARY: 127 mg/dL — AB (ref 65–99)
GLUCOSE-CAPILLARY: 131 mg/dL — AB (ref 65–99)
GLUCOSE-CAPILLARY: 152 mg/dL — AB (ref 65–99)
Glucose-Capillary: 104 mg/dL — ABNORMAL HIGH (ref 65–99)
Glucose-Capillary: 174 mg/dL — ABNORMAL HIGH (ref 65–99)

## 2015-06-27 LAB — LACTIC ACID, PLASMA: Lactic Acid, Venous: 1 mmol/L (ref 0.5–2.0)

## 2015-06-27 MED ORDER — KCL IN DEXTROSE-NACL 20-5-0.45 MEQ/L-%-% IV SOLN
INTRAVENOUS | Status: DC
  Administered 2015-06-27 – 2015-07-06 (×19): via INTRAVENOUS
  Filled 2015-06-27 (×31): qty 1000

## 2015-06-27 MED ORDER — LEVETIRACETAM 100 MG/ML PO SOLN
1000.0000 mg | Freq: Two times a day (BID) | ORAL | Status: DC
  Administered 2015-06-27: 1000 mg via ORAL
  Filled 2015-06-27: qty 10

## 2015-06-27 MED ORDER — SODIUM CHLORIDE 0.9 % IV SOLN
1000.0000 mg | Freq: Two times a day (BID) | INTRAVENOUS | Status: DC
  Administered 2015-06-27 – 2015-07-14 (×33): 1000 mg via INTRAVENOUS
  Filled 2015-06-27 (×37): qty 10

## 2015-06-27 NOTE — Plan of Care (Signed)
Problem: RH BLADDER ELIMINATION Goal: RH STG MANAGE BLADDER WITH ASSISTANCE STG Manage Bladder With mod Assistance  Outcome: Not Progressing Foley at this time

## 2015-06-27 NOTE — Progress Notes (Signed)
Scheduled Cardizem and Keppra held per Dr. Leanne Chang. Will keep patient NPO until evaluated by speech therapy. Continues with congested, wet sounding cough. Occasionally coughs up brown sputum. Rhonchi throughout. Speech garbled.  Foley placed, because of acute urinary retention. MRI results called to Dr.Swords. Porta cath accessed by IV team. Will continue to monitor. Ivan Beasley A

## 2015-06-27 NOTE — Progress Notes (Signed)
Patient speech still slurred/garbled, Rt side facial droop. MD Plotnikov spoke with patient's wife this morning about the test results. Patient now NPO, IV fluids continuous. Patient denies pain.

## 2015-06-27 NOTE — Progress Notes (Signed)
Physical Therapy Note  Patient Details  Name: Ivan Beasley MRN: [id number] Date of Birth: 25-Jan-1940 Today's Date: 06/27/2015    Pt missed two PT sessions (total 105 min) due to MD order for bedrest. Will continue to follow per POC once orders removed.    Benjiman Core Tygielski 06/27/2015, 9:09 AM

## 2015-06-27 NOTE — Progress Notes (Signed)
Occupational Therapy Session Note  Patient Details  Name: Ivan Beasley MRN: [id number] Date of Birth: [birth year]  Today's Date: 06/27/2015  Pt missed two OT  sessions (total 90 min) due to MD order for bedrest. Will continue to follow per POC once orders removed  Short Term Goals: Week 1:  OT Short Term Goal 1 (Week 1): Pt will demonstrate increased awareness to RUE by positioning appropriately to decrease shoulder subluxation OT Short Term Goal 2 (Week 1): Pt will completed squat pivot transfer to toilet with max assist of one caregiver OT Short Term Goal 3 (Week 1): Pt will complete UB dressing with mod assist OT Short Term Goal 4 (Week 1): Pt will complete LB dressing with max assist of one caregiver at sit > stand level OT Short Term Goal 5 (Week 1): Pt will complete bathing with mod assist at sit > stand level          Therapy/Group: Individual Therapy  Lisa Roca 06/27/2015, 8:03 AM

## 2015-06-27 NOTE — Significant Event (Signed)
CRITICAL VALUE ALERT  Critical value received:  Lactic acid-2.1  Date of notification:  06/26/15  Time of notification:  2000  Critical value read back:Yes.    Nurse who received alert:  Roselyn Reef, RN  MD notified (1st page):  Dr. Leanne Chang  Time of first page:  2005  MD notified (2nd page):  Time of second page:  Responding MD:  Dr. Leanne Chang   Time MD responded:  2005

## 2015-06-27 NOTE — Progress Notes (Signed)
Ivan Beasley is a 75 y.o. male 05-Dec-1940 [id number]  Subjective: The pt had an episode of slurred speech and MS changes around lunch time on 6/17. Labs/head CT and head MRI were done. The pt was given IVF and IV Abx. He is better today...  No change in chronic cough. F/u diarrhea and incontinence to stool and urine w/coughing (after a CVA) - no relapse.   Objective: Vital signs in last 24 hours: Temp:  [97.7 F (36.5 C)-98.5 F (36.9 C)] 98.5 F (36.9 C) (06/18 0414) Pulse Rate:  [85-92] 92 (06/18 0414) Resp:  [16-20] 18 (06/18 0414) BP: (118-144)/(44-63) 136/59 mmHg (06/18 0414) SpO2:  [93 %-98 %] 93 % (06/18 0414) Weight:  [206 lb 8 oz (93.668 kg)] 206 lb 8 oz (93.668 kg) (06/18 0414) Weight change: 7 lb 3.3 oz (3.268 kg) Last BM Date: 06/26/15  Intake/Output from previous day: 06/17 0701 - 06/18 0700 In: 480 [P.O.:480] Out: 2625 [Urine:2625] Last cbgs: CBG (last 3)   Recent Labs  06/26/15 2001 06/26/15 2340 06/27/15 0411  GLUCAP 88 86 104*     Physical Exam General: No apparent distress  Appears chronically ill HEENT: not dry Lungs: Normal effort. Lungs w/coarse BS, no crackles. Cardiovascular: Regular rate and rhythm, no LE edema Abdomen: S/NT/ND; BS(+) Musculoskeletal:  unchanged Neurological: No new neurological deficits Wounds: N/A    Skin: clear  Aging changes Mental state: Alert,  Cooperative  Spoke w/wife    Lab Results: BMET    Component Value Date/Time   NA 136 06/26/2015 1806   NA 134* 04/30/2014 0938   K 4.2 06/26/2015 1806   K 5.0 04/30/2014 0938   CL 104 06/26/2015 1806   CL 102 04/30/2014 0938   CO2 22 06/26/2015 1806   CO2 25 04/30/2014 0938   GLUCOSE 78 06/26/2015 1806   GLUCOSE 225* 04/30/2014 0938   BUN 17 06/26/2015 1806   BUN 25* 04/30/2014 0938   CREATININE 1.26* 06/26/2015 1806   CREATININE 1.20 04/30/2014 0938   CALCIUM 9.4 06/26/2015 1806   CALCIUM 9.2 04/30/2014 0938   GFRNONAA 54* 06/26/2015 1806   GFRNONAA  59* 04/30/2014 0938   GFRAA >60 06/26/2015 1806   GFRAA >60 04/30/2014 0938   CBC    Component Value Date/Time   WBC 7.0 06/26/2015 1806   WBC 8.0 04/30/2014 0938   RBC 3.07* 06/26/2015 1806   RBC 4.33* 04/30/2014 0938   HGB 8.1* 06/26/2015 1806   HGB 12.2* 04/30/2014 0938   HCT 26.5* 06/26/2015 1806   HCT 37.4* 04/30/2014 0938   PLT 270 06/26/2015 1806   PLT 158 04/30/2014 0938   MCV 86.3 06/26/2015 1806   MCV 86 04/30/2014 0938   MCH 26.4 06/26/2015 1806   MCH 28.1 04/30/2014 0938   MCHC 30.6 06/26/2015 1806   MCHC 32.6 04/30/2014 0938   RDW 18.2* 06/26/2015 1806   RDW 16.1* 04/30/2014 0938   LYMPHSABS 1.0 06/29/2015 1722   LYMPHSABS 1.5 04/30/2014 0938   MONOABS 0.8 06/24/2015 1722   MONOABS 1.1* 04/30/2014 0938   EOSABS 0.2 06/17/2015 1722   EOSABS 0.3 04/30/2014 0938   BASOSABS 0.0 07/05/2015 1722   BASOSABS 0.0 04/30/2014 0938    Studies/Results: Dg Chest 2 View  06/26/2015  CLINICAL DATA:  C/o chronic cough. 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 Rales heard in right lower lung. 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 EXAM: CHEST  2 VIEW COMPARISON:  06/25/2015 FINDINGS: There stable changes  from prior CABG surgery. Cardiac silhouette is mildly enlarged. Focal irregular opacity in the posterior perihilar region of the left lung is stable. There are no new areas of lung opacification. No pleural effusion or pneumothorax. Right anterior chest wall Port-A-Cath is stable. Bony thorax is intact. IMPRESSION: 1. No acute cardiopulmonary disease. 2. No change from the prior study. Persistent focal opacity in the perihilar region of the left lung consistent with the reported squamous cell lung carcinoma. Electronically Signed   By: Lajean Manes M.D.   On: 06/26/2015 16:36   Dg Chest 2 View  06/25/2015  CLINICAL DATA:  Cough, left-sided weakness EXAM: CHEST  2 VIEW COMPARISON:  06/15/2015 FINDINGS: Cardiomediastinal silhouette is  stable. Right IJ Port-A-Cath is unchanged in position. Persistent left upper lobe consolidation. Bony thorax is unremarkable. IMPRESSION: No significant change. Persistent left upper lobe consolidation. Stable right IJ Port-A-Cath position. Electronically Signed   By: Lahoma Crocker M.D.   On: 06/25/2015 16:54   Ct Head Wo Contrast  06/26/2015  CLINICAL DATA:  Slurred speech. EXAM: CT HEAD WITHOUT CONTRAST TECHNIQUE: Contiguous axial images were obtained from the base of the skull through the vertex without intravenous contrast. COMPARISON:  MRI of June 14, 2015.  CT scan of June 13, 2015. FINDINGS: Bony calvarium appears intact.  Sphenoid sinusitis is noted. Mild diffuse cortical atrophy is noted. There is continued presence of left superior parietal intraparenchymal hemorrhage which now measures 21 x 11 mm and is slightly decreased in size compared to prior exam. Subarachnoid hemorrhage has resolved. There is continued surrounding vasogenic edema. No significant midline shift is noted. Ventricular size is unremarkable. IMPRESSION: Mild diffuse cortical atrophy. Continued presence of left parietal intraparenchymal hemorrhage with surrounding vasogenic edema. This is improved compared to prior exam. Subarachnoid hemorrhage is no longer present. Electronically Signed   By: Marijo Conception, M.D.   On: 06/26/2015 14:29   Ivan Beasley QP Contrast  06/26/2015  CLINICAL DATA:  Initial evaluation for progressively garbled and slurred speech, with increased right-sided facial droop. EXAM: MRI HEAD WITHOUT AND WITH CONTRAST TECHNIQUE: Multiplanar, multiecho pulse sequences of the brain and surrounding structures were obtained without and with intravenous contrast. CONTRAST:  62m MULTIHANCE GADOBENATE DIMEGLUMINE 529 MG/ML IV SOLN COMPARISON:  Prior CT from earlier the same day as well as previous MRI from 06/14/2015. FINDINGS: Generalized cerebral atrophy with minimal chronic small vessel ischemic changes again noted, stable.  Small remote lacunar infarct within the right thalamus. Previously identified posterior left frontal lobe complex hemorrhagic lesion again seen, overall likely decreased in size from previous. Lesion measures 13 x 21 x 16 mm on T2 weighted sequences. Surrounding vasogenic edema is similar. Breakthrough subarachnoid hemorrhage within the adjacent cortical sulci again seen, improved. There is increased peripheral enhancement about the margin of the hemorrhagic lesion, likely related to the evolving hematoma. Previously noted small hemorrhagic nodule is not as clearly delineated on this exam. Note again made of a second possible enhancing lesion within the anterior left parietal lobe along the dura, measuring approximately 9 x 11 x 11 mm. Surrounding vasogenic edema. No other new enhancing lesions. Immediately lateral and inferior to the dominant hemorrhagic lesion. There is a focus of abnormal restricted diffusion involving the cortical gray matter and underlying white matter of the posterior left frontal region, compatible with acute ischemic infarct (series 4, image 35). Associated signal loss on ADC map. This appears to be more vascular distribution rather than seizure related. Additional small sub cm focus of restricted  diffusion more posteriorly within the left centrum semi ovale. No significant mass effect. Area of infarction closely approximates the hemorrhagic lesion can subarachnoid hemorrhage, but does not appear to be hemorrhagic in of itself. No other acute infarct. Gray-white matter differentiation otherwise maintained. Major intracranial vascular flow voids are preserved. No midline shift. No hydrocephalus. No extra-axial fluid collection. Again, major dural sinuses appear to be grossly patent. Craniocervical junction within normal limits. Visualized upper cervical spine unremarkable. Pituitary gland within normal limits. No acute abnormality about the globes and orbits. Patient is status post lens  extraction bilaterally. Mucosal thickening with fluid level within the left sphenoid sinus, similar to prior. Mild scattered opacity within the anterior ethmoidal air cells. Bilateral mastoid effusions present. Visualize bone marrow within normal limits. Scalp soft tissues demonstrate no acute abnormality. IMPRESSION: 1. Acute ischemic left MCA territory infarct adjacent to the posterior left frontal hemorrhagic lesion. Vasospasm as an underlying etiology is suspected given the adjacent complex hemorrhagic lesion and subarachnoid hemorrhage. 2. Continued interval evolution of complex left frontal lobe hemorrhagic lesion with similar vasogenic edema. Associated breakthrough subarachnoid hemorrhage is improved. This lesion now demonstrates peripheral enhancement, which would be expected with an evolving hematoma. Previously noted small nodular focus of enhancement not well delineated on this exam. Again, continued Ivan surveillance at the hemorrhage clears is recommended to ensure no underlying lesion is present. 3. Additional 9 x 11 x 11 mm enhancing lesion within the anterior left parietal lobe, similar to prior. Attention at follow-up recommended. 4. Acute left sphenoid sinus disease with bilateral mastoid effusions, similar to prior. Electronically Signed   By: Jeannine Boga M.D.   On: 06/26/2015 23:30    Medications: I have reviewed the patient's current medications.  Assessment/Plan:  1. 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 6/17 follow-up MRI of the brain -  IMPRESSION: 1. Acute ischemic left MCA territory infarct adjacent to the posterior left frontal hemorrhagic lesion. Vasospasm as an underlying etiology is suspected given the adjacent complex hemorrhagic lesion and subarachnoid hemorrhage. 2. Continued interval evolution of complex left frontal lobe hemorrhagic lesion with similar vasogenic edema. Associated breakthrough subarachnoid  hemorrhage is improved. This lesion now demonstrates peripheral enhancement, which would be expected with an evolving hematoma. Previously noted small nodular focus of enhancement not well delineated on this exam. Again, continued Ivan surveillance at the hemorrhage clears is recommended to ensure no underlying lesion is present. 3. Additional 9 x 11 x 11 mm enhancing lesion within the anterior left parietal lobe, similar to prior. Attention at follow-up recommended. 4. Acute left sphenoid sinus disease with bilateral mastoid effusions, similar to prior.   Electronically Signed  By: Jeannine Boga M.D.  On: 06/26/2015 23:30   - Continue CIR PTOT speech             - IVF 2. DVT Prophylaxis/Anticoagulation: SCDs.  3. Pain Management: Ultram and oxycodone as needed 4. Mood: Xanax 0.25 mg 3 times daily as needed 5. Neuropsych: This patient is capable of making decisions on his own behalf with caregiver/staff assistance. 6. Skin/Wound Care: Routine skin checks 7. Fluids/Electrolytes/Nutrition: Routine I&O's with follow-up chemistries 8. Seizure prophylaxis. Keppra now at 1000 mg twice a day---more alert. EEG displayed seizure activity 9. Atrial fibrillation. Cardiac rate control. Continue amiodarone 200 mg daily, Cardizem 60 mg twice a day.Eliquis held secondary to [place] 10. 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 This is likely the etiology of  the persistent cough, Right lung nodules, right lower lobe has some rales will recheck x-ray, Will change to Robitussin-DM cough syrup as well as uses BenGay for muscle pain related to cough. 11. Diabetes mellitus with peripheral neuropathy. Hemoglobin A1c 6.8. Glucophage 1000 mg at breakfast and 500 mg supper: generally well controlled on the current regimen Amaryl 1 mg daily. Check blood sugars before meals and at bedtime 12. Hyperlipidemia. Lipitor     13. Incontinence to stool: treat diarrhea - better 14. UTI - on IV Vanc and Zosyn starting 6/17 15. Slurred speech - improved. CT/MRI results were reviewed w/pt and wife: Acute ischemic left MCA territory infarct adjacent to the posterior left frontal hemorrhagic lesion. 16. Sphenoid sinusitis on the MRI - on IV abx now      Length of stay, days: [place] , MD 06/27/2015, 8:44 AM

## 2015-06-28 ENCOUNTER — Inpatient Hospital Stay (HOSPITAL_COMMUNITY): Payer: Medicare HMO | Admitting: Occupational Therapy

## 2015-06-28 ENCOUNTER — Inpatient Hospital Stay (HOSPITAL_COMMUNITY): Payer: Medicare HMO | Admitting: Speech Pathology

## 2015-06-28 ENCOUNTER — Inpatient Hospital Stay (HOSPITAL_COMMUNITY): Payer: Medicare HMO | Admitting: *Deleted

## 2015-06-28 ENCOUNTER — Inpatient Hospital Stay (HOSPITAL_COMMUNITY): Payer: Medicare HMO | Admitting: Physical Therapy

## 2015-06-28 DIAGNOSIS — I63512 Cerebral infarction due to unspecified occlusion or stenosis of left middle cerebral artery: Secondary | ICD-10-CM

## 2015-06-28 DIAGNOSIS — I63412 Cerebral infarction due to embolism of left middle cerebral artery: Secondary | ICD-10-CM | POA: Insufficient documentation

## 2015-06-28 LAB — URINE CULTURE

## 2015-06-28 LAB — GLUCOSE, CAPILLARY
GLUCOSE-CAPILLARY: 147 mg/dL — AB (ref 65–99)
GLUCOSE-CAPILLARY: 154 mg/dL — AB (ref 65–99)
Glucose-Capillary: 154 mg/dL — ABNORMAL HIGH (ref 65–99)
Glucose-Capillary: 146 mg/dL — ABNORMAL HIGH (ref 65–99)
Glucose-Capillary: 167 mg/dL — ABNORMAL HIGH (ref 65–99)

## 2015-06-28 LAB — HEPARIN LEVEL (UNFRACTIONATED): Heparin Unfractionated: 0.21 IU/mL — ABNORMAL LOW (ref 0.30–0.70)

## 2015-06-28 MED ORDER — DEXTROSE 5 % IV SOLN
1.0000 g | INTRAVENOUS | Status: DC
  Administered 2015-06-28 – 2015-07-04 (×7): 1 g via INTRAVENOUS
  Filled 2015-06-28 (×8): qty 10

## 2015-06-28 MED ORDER — HEPARIN (PORCINE) IN NACL 100-0.45 UNIT/ML-% IJ SOLN
1350.0000 [IU]/h | INTRAMUSCULAR | Status: DC
  Administered 2015-06-28: 1300 [IU]/h via INTRAVENOUS
  Administered 2015-06-29: 1400 [IU]/h via INTRAVENOUS
  Administered 2015-06-30 – 2015-07-01 (×2): 1350 [IU]/h via INTRAVENOUS
  Administered 2015-07-04: 1250 [IU]/h via INTRAVENOUS
  Filled 2015-06-28 (×8): qty 250

## 2015-06-28 NOTE — Progress Notes (Signed)
Physical Therapy Session Note  Patient Details  Name: Ivan Beasley MRN: [id number] Date of Birth: Jul 31, 1940  Today's Date: 06/28/2015 PT Individual Time: 1100-1200 and 1445-1515 PT Individual Time Calculation (min): 60 min and 30 min (total 90 min)   Short Term Goals: Week 1:  PT Short Term Goal 1 (Week 1): Pt will perform bed mobility maxA from flat bed using bedrails PT Short Term Goal 2 (Week 1): Pt will perform squat pivot transfer modA PT Short Term Goal 3 (Week 1): Pt will initiate gait training with LRAD PT Short Term Goal 4 (Week 1): Pt will perform w/c propulsion modA with L hemi technique PT Short Term Goal 5 (Week 1): Pt will demonstrate sitting balance x3 min with minA  Skilled Therapeutic Interventions/Progress Updates:    Tx 1: Pt received supine in bed, denies pain and agreeable to treatment. Pt with decline in functional status due to secondary CVA over the weekend. Supine>sit maxA with bedrails and HOB elevated. Once on EOB, unable to maintain sitting balance without max/totalA with significant posterior lean, LUE pushing on L knee in posterior direction, and requires max multimodal cues for anterior weight shift. Lateral scoot w/c <>bed/mat table with transfer board x3 during session, +2A with pt initiating board placement however ultimately requiring assist for accurate/safe placement. Sitting balance on mat table with anterior weight shifts, LUE dynamic reaching for horseshoes with tossing toward target. Initially requiring max/totalA for balance, improved to minA/min guard by end of session following repetitive LUE anterior/L lateral reaching. Note delay in initiation/attention to R side when cued to reach for items on R, unable to determine if due to field cut, inattention, poor initiation. Will continue to assess. Remained seated in w/c with towel roll in lumbar spine, RUE supported on half lap tray and pillow, all needs in reach with wife present.   Tx 2: Pt received  asleep in bed, wife declines therapy for pt reporting "he just got in bed 4 minutes ago, he's not getting up". Educated wife on change in transfer method from [facility] to [place] due to decreased mobility status and poor sitting balance. Due to wife's concerns and frustrations after recent transfer with [facility], discussed use of maximove, how sling is placed and used to ease wife's anxiety about new transfer method. Wife reports pt had been getting frustrated that nursing staff had not yet arrived and had "thrown his arm off the tray and was ready to get up by himself". Tilt-in-space w/c placed in room and plan to assess fit tomorrow when pt more alert; educated wife re: benefits for pressure relief, head/neck support, allowing pt to rest more comfortably in chair if nursing staff not readily available to help pt back to bed. Alerted nursing staff and updated safety plan to recommendation for maxi move, hold use of tilt in space w/c until tomorrow. Wife also questioned therapist regarding opinion of blood thinners and risk for recurrent stroke, pt strong dislike of Eliqus; deferred questioning for PA/MD. Pt in bed in R sidelying, alarm intact and all needs in reach at completion of session.   Therapy Documentation Precautions:  Precautions Precautions: Fall Precaution Comments: R hemi, pt on home O2 PTA for recent pneumonia (but used it PRN, not 24/7) Restrictions Weight Bearing Restrictions: No General: PT Amount of Missed Time (min): 15 Minutes PT Missed Treatment Reason: Patient fatigue Pain: Pain Assessment Pain Assessment: No/denies pain   See Function Navigator for Current Functional Status.   Therapy/Group: Individual Therapy  Luberta Mutter  06/28/2015, 12:02 PM

## 2015-06-28 NOTE — Progress Notes (Signed)
STROKE TEAM PROGRESS NOTE   SUBJECTIVE (INTERVAL HISTORY) Wife at bedside. Pt had event on 06/26/15.  "As per Rn patient was being cleaned up and after patient was noticed to have increased slurred speech and inability to get words out, and increased facial droop. VSS. Pateint is very emotional and tearful at this time." His CT showed evolving left frontal ICH, and MRI brain with and without contrast, showed left MCA small infarct adjacent to [place]. He has afib and his eliquis was on hold due to [place]. Repeat MRI did not confirm brain metastasis but still not able to rule out at this time. Will still need later repeat MRI with and without contrast.   OBJECTIVE Temp:  [98.2 F (36.8 C)] 98.2 F (36.8 C) (06/19 0450) Pulse Rate:  [82] 82 (06/19 0450) Cardiac Rhythm:  [-]  Resp:  [18] 18 (06/19 0450) BP: (122-136)/(54-70) 122/70 mmHg (06/19 1239) SpO2:  [97 %] 97 % (06/19 0450)  CBC:   Recent Labs Lab 06/28/2015 1722 06/26/15 1806  WBC 7.0 7.0  NEUTROABS 5.1  --   HGB 8.5* 8.1*  HCT 27.8* 26.5*  MCV 87.4 86.3  PLT 180 449    Basic Metabolic Panel:   Recent Labs Lab 07/02/2015 1722 06/26/15 1806  NA 135 136  K 3.9 4.2  CL 106 104  CO2 19* 22  GLUCOSE 148* 78  BUN 25* 17  CREATININE 1.18 1.26*  CALCIUM 9.2 9.4    Lipid Panel:     Component Value Date/Time   CHOL 206* 06/14/2015 0215   TRIG 64 06/15/2015 0550   HDL 50 06/14/2015 0215   CHOLHDL 4.1 06/14/2015 0215   VLDL 11 06/14/2015 0215   LDLCALC 145* 06/14/2015 0215   HgbA1c:  Lab Results  Component Value Date   HGBA1C 6.8* 06/14/2015   Urine Drug Screen: No results found for: LABOPIA, COCAINSCRNUR, LABBENZ, AMPHETMU, THCU, LABBARB    IMAGING I have personally reviewed the radiological images below and agree with the radiology interpretations.  Ct Head Wo Contrast 06/13/2015   Left posterior frontal intra cerebral hemorrhage with subarachnoid extension as described above.   Dg Chest Port 1 View 06/15/2015    Slightly better aeration. Endotracheal tube tip 3.8 cm above the carina.  06/14/2015  Stable support apparatus. Persistent opacity left hilum and left perihilar region. Postradiation fibrosis in left upper lobe again noted. Left basilar atelectasis. No pulmonary edema. No pneumothorax. Status post median sternotomy.  06/13/2015  Endotracheal tube above the carina. Stable appearing postsurgical changes and persistent opacity in the left mid lung field.  06/13/2015   Stable to slightly increased left lung opacity which may represent posttreatment changes. No other significant change.  06/16/15 - Interval removal of the endotracheal and enteric tube. Stable appearing density in the left mid lung field with overall improvement of the aeration of the left lung.  2D Echocardiogram  - Procedure narrative: Transthoracic echocardiography. Image quality was poor. The study was technically difficult. - Left ventricle: The cavity size was normal. Systolic function was mildly reduced. The estimated ejection fraction was in the range of 45% to 50%. Wall motion was normal; there were no regional wall motion abnormalities. - Aortic valve: Valve area (Vmax): 2.82 cm^2. - Left atrium: The atrium was mildly dilated. - Right ventricle: The cavity size was mildly dilated. Wall thickness was normal. - Right atrium: The atrium was mildly dilated. - Pericardium, extracardiac: Features were not consistent with tamponade physiology. Impressions:  There was no evidence of a  vegetation.  MRI brain w and w/o contrast  06/15/2015   With the rounded contour of the enhancing component of the left frontal lobe hematoma, follow-up until clearance recommended to exclude underlying vascular abnormality. Posterior left frontal lobe complex 2.5 x 2.2 x 2 cm hemorrhagic lesion with marked surrounding vasogenic edema with breakthrough into sulci with subarachnoid hemorrhage noted. Patient's history of lung cancer in addition to 6 mm rounded area of  enhanced along the posterior margin of this hemorrhage suggests that this is most likely is related to a hemorrhagic metastatic lesion which has bled. Continued MR surveillance as hemorrhage clears is recommended. There may be a a second enhancing lesion within the anterior left parietal lobe with surrounding vasogenic edema. Venous infarct can have a similar appearance. The major dural sinuses appear patent. Opacification left sphenoid sinus with air-fluid level suggesting acute sinusitis. Bilateral mastoid air cell and middle ear opacification greater on the left without obstructing lesion of the eustachian tube noted.   MRA head 06/15/2015  MR angiogram does not incorporate the left frontal lobe hemorrhage. Mild narrowing supraclinoid aspect internal carotid artery bilaterally with irregularity more notable on the left. Small infundibulum on the left posterior communicating artery level. Anterior circulation without medium or large size vessel significant stenosis or occlusion. Middle cerebral artery branch vessel irregularity bilaterally. Right vertebral artery ends in a posterior inferior cerebellar artery distribution. No significant narrowing left vertebral artery. Moderate narrowing portions of the left posterior inferior cerebellar artery. Ectatic basilar artery without high-grade stenosis. Nonvisualized anterior inferior cerebellar arteries. Small left superior cerebellar artery. Posterior cerebral artery distal branch vessel narrowing bilaterally.   CUS Right: 1-39% ICA stenosis. Left: 1-39% ICA stenosis, however, higher velocities may be obscured by significant calcific plaque at the origin of the left ICA. Bilateral vertebral artery flow is antegrade.   LE venous doppler - negative for DVT  EEG - EEG Abnormalities: 1) mild generalized irregular slow activity Clinical Interpretation: This EEG is consistent with a mild generalized non-specific cerebral dysfunction(encephalopathy). This can be  seen with sedative medications among other causes. There was no seizure or seizure predisposition recorded on this study. Please note that a normal EEG does not preclude the possibility of epilepsy.   Dg Chest 2 View  06/26/2015  IMPRESSION: 1. No acute cardiopulmonary disease. 2. No change from the prior study. Persistent focal opacity in the perihilar region of the left lung consistent with the reported squamous cell lung carcinoma.  Ct Head Wo Contrast  06/26/2015  IMPRESSION: Mild diffuse cortical atrophy. Continued presence of left parietal intraparenchymal hemorrhage with surrounding vasogenic edema. This is improved compared to prior exam. Subarachnoid hemorrhage is no longer present.   [name] Wo Contrast  06/26/2015  IMPRESSION: 1. Acute ischemic left MCA territory infarct adjacent to the posterior left frontal hemorrhagic lesion. Vasospasm as an underlying etiology is suspected given the adjacent complex hemorrhagic lesion and subarachnoid hemorrhage. 2. Continued interval evolution of complex left frontal lobe hemorrhagic lesion with similar vasogenic edema. Associated breakthrough subarachnoid hemorrhage is improved. This lesion now demonstrates peripheral enhancement, which would be expected with an evolving hematoma. Previously noted small nodular focus of enhancement not well delineated on this exam. Again, continued MR surveillance at the hemorrhage clears is recommended to ensure no underlying lesion is present. 3. Additional 9 x 11 x 11 mm enhancing lesion within the anterior left parietal lobe, similar to prior. Attention at follow-up recommended. 4. Acute left sphenoid sinus disease with bilateral mastoid effusions, similar to  prior.     PHYSICAL EXAM General - Well nourished, well developed, lethargic.   Ophthalmologic - Fundi not visualized due to continued coughing.  Cardiovascular - Regular rate and rhythm.  Neuro - lethargic, sleepy with intermittent cough, follows  simple central and peripheral commands, moderate dysarthria, able to repeat and name with dysarthria. Blinking to visual threat b/l, PERRL, EOMI, right facial droop with drooling, tongue in middle. Left UE 5/5 UE and 4/5 LE. RUE 0/5 and RLE 1/5 to pain. DTR diminished, and no babinski. Sensation symmetrical, coordination intact LUE and gait not tested.   ASSESSMENT/PLAN Mr. MARTI ACEBO is a 75 y.o. male with history of stage III lung cancer after chemo and radiation therapy, recent PNA and atrial fibrillation on eliquis presenting with R sided hemiplegia. CT showed a L frontal ICH with adjacent SAH.   Stroke: left MCA infarct adjacent to left frontal ICH, likely due to afib with hold off anticoagulation due to [place]. Although hypercoagulable state due to advance malignancy is still in DDx.   Resultant - aphasia, worsening dysarthria   MRI - left MCA territory infarct  Due to near resolution of ICH as well as has been 2 weeks, and high risk for recurrent embolic stroke off anticoagulation and relative small size of infarct, we recommend to start heparin drip for stroke prevention. Recommend no heparin bolus and low intensity heparin levels (0.3-0.5)  Will consider to transition back to DOACs if pt stable on heparin drip in about 5 days. Wife stated that pt would like want to be put back on eliquis. If so, may consider Xarelto.  Stat CT head without contrast if any neuro changes.  Continue PT/OT/speech  ICH:  left frontal ICH with adjacent SAH while on Eliquis. MRI questionable hemorrhage secondary to lung cancer metastasis to brain in the setting of eliquis use.   Reversed with Kcentra  Resultant right hemiplegia  MRI w/ & w/o  Posterior L frontal lobe hemorrhage w/ marked surrounding vasogenic edema with SAH, concern for metastatic lesion posterior margin of hmg and anterior L parietal lobe.   Repeat MRI with and without contrast 06/26/15 can not confirm or rule out metastasis at this  time.  Still need to repeat MRI with and without contrast after blood all absorbed.  MRA  Atherosclerosis, no high grade stenosis  Carotid Doppler  No significant stenosis  2D Echo from 04/2015 EF 45-50%. No source of embolus, no vegetation  LE venous doppler negative for DVT  LDL 145  HgbA1c 6.8  SCDs for VTE prophylaxis Diet NPO time specified  Eliquis (apixaban) daily prior to admission, no antithrombotics after admission  Ongoing aggressive stroke risk factor management  Transient vs. Paroxsymal Atrial Fibrillation  Home anticoagulation:  Eliquis (apixaban) daily   Stopped given ICH, but due to left MCA infarct, will start heparin as above   Found to have afib in the setting of respiratory failure  Following with Dr. 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, per oncology note 5/21  MRI brain questionable mets posterior L frontal lobe hemorrhage margin and anterior L parietal lobe - still need to repeat MRI brain after ICH absorbed.  Oncology appointment on 7/13 for PET and 7/14 for Dr. Oliva Bustard   Possible seizure with asystole  Treated with ativan and  forphenytoin  on Keppra '1000mg'$  bid  EEG no seizure  Continue keppra  Hyperlipidemia  Home meds:  No statin   LDL 145, goal < 70  On lipitor '40mg'$   Diabetes  HgbA1c 6.8, goal < 7.0  On SSI  On home DM meds  CBG under control   Other Stroke Risk Factors  Advanced age  Former Cigarette smoker, quit 2000  Overweight, Body mass index is 28 kg/(m^2).   Coronary artery disease, s/p CABG 2000, MI  Other Active Problems  CKD stage III  Hospital day # 7   Rosalin Hawking, MD PhD Stroke Neurology 06/28/2015 2:32 PM    To contact Stroke Continuity provider, please refer  to [email or web address] After hours, contact General Neurology

## 2015-06-28 NOTE — Evaluation (Signed)
Speech Language Pathology Clinical Bedside Swallow Re-Assessment and Plan  Patient Details  Name: Ivan Beasley MRN: [id number] Date of Birth: 1940/10/13  SLP Diagnosis: Dysphagia  Rehab Potential: Good ELOS: 24-27 days     Today's Date: 06/28/2015 SLP Individual Time: 0160-1093 SLP Individual Time Calculation (min): 46 min   Problem List:  Patient Active Problem List   Diagnosis Date Noted  . SAH (subarachnoid hemorrhage) ([place]) 06/22/2015  . Coagulopathy ([place]) - eliquis related 07/04/2015  . Brain metastases ([place]), possible 06/26/2015  . Seizures ([place]), possible 07/04/2015  . Asystole ([place]) 07/03/2015  . Hypophosphatemia 06/16/2015  . Hypomagnesemia 06/28/2015  . CKD (chronic kidney disease), stage III 07/09/2015  . Anemia 06/20/2015  . Right hemiparesis ([place]) 06/29/2015  . Cough   . Idiopathic hypotension   . Lung cancer ([place])   . ICH (intracerebral hemorrhage) ([place])- L frontal ICH 06/13/2015  . Paroxysmal atrial fibrillation ([place]) 05/13/2015  . PNA (pneumonia) 05/13/2015  . HCAP (healthcare-associated pneumonia)   . Pneumonitis   . Acute respiratory failure ([place])   . Acute on chronic respiratory failure with hypoxia ([place]) 04/17/2015  . Respiratory failure ([place]) 04/17/2015  . Cancer of lung ([place]) 05/17/2014  . 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 Cough, persistent 08/29/2013  . Mild chronic obstructive pulmonary disease ([place]) 08/29/2013  . H/O cardiac catheterization 07/29/2013  . Breath shortness 06/23/2013  . Chest pain 06/23/2013  . BP (high blood pressure) 06/20/2013  . Diabetes ([place]) 06/20/2013  . HLD (hyperlipidemia) 06/20/2013  . H/O coronary artery bypass surgery 06/20/2013  . Diabetes mellitus ([place]) 06/20/2013  . H/O total knee replacement 06/03/2013   Past Medical History:  Past Medical History  Diagnosis Date  . Coronary artery disease   . Pneumonia 2000  . Arthritis   . 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 Bruises easily   . GERD  (gastroesophageal reflux disease)     takes Omeprazole daily as needed for stomach pain  . Blood transfusion 2001  . Diabetes mellitus     takes Metformin daily;  . Impaired hearing     bil hearing aide  . Macular degeneration     being watched for this but hasn't been "completely" diagnosed  . Myocardial infarction ([place]) 2001  . Cancer [facility])    Past Surgical History:  Past Surgical History  Procedure Laterality Date  . 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 Cardiac catheterization  2001  . Tonsillectomy      as a child  . Colonoscopy    . Cataract extraction      bilateral  . Lumbar laminectomy/decompression microdiscectomy  12/19/2010    Procedure: LUMBAR LAMINECTOMY/DECOMPRESSION MICRODISCECTOMY;  Surgeon: Elaina Hoops;  Location: [place] NEURO ORS;  Service: Neurosurgery;  Laterality: N/A;  Lumbar three-four, four-five decompressive lumbar laminectomy  . Joint replacement  right tkr  . Eye surgery      cataracts  . Back surgery      l4 5  . Portacath placement N/A 05/20/2014    Procedure: INSERTION PORT-A-CATH;  Surgeon: Nestor Lewandowsky, MD;  Location: [facility] ORS;  Service: General;  Laterality: N/A;    Assessment / Plan / Recommendation Clinical Impression   Ivan Beasley is a 75 y.o. right handed male with history of diabetes mellitus, coronary artery disease with CABG, atrial fibrillation on Eliquis and recent diagnosis of left lung squamous cell cancer April 2016 with subsequent chemotherapy radiation but developed abd  lymph node enlargement treated with immunosuppressant therapy April 2017 to which he reacted violently. Presented with 06/13/2015 to [facility] with altered mental status and right-sided weakness. CT of the head revealed left frontal intracerebral hemorrhage with subarachnoid hemorrhage.  MRI of the brain showed posterior left frontal lobe complex 2.5 x 2.2 x 2 cm hemorrhagic lesion with marked surrounding vasogenic edema with breakthrough  into sulci with subarachnoid hemorrhage noted. Plan to repeat MRI for further evaluation of possible metastases posterior left frontal lobe after ICH absorbed. Pt admitted to CIR and was progressing therapies. Re-evaluation for dysphagia ordered after new L MCA hemorrhage.  Bedside swallow evaluation completed with the following results:    Pt presents with s/s of a moderate oropharyngeal dysphagia.  Oral phase impairments are characterized by worsened right sided oral motor weakness which impact oral containment and transit of all consistencies assessed.  Pharyngeally, pt presents with what appears to be delayed swallow initiation and multiple swallows with all boluses presented regardless of viscosity.  Cough response was only elicited with mixed solid and liquid consistencies; however, given other clinical concerns mentioned above pt is at an increased risk of silent aspiration (decreased pharyngeal sensation/limited gag reflex (Lt>Rt) also noted during suctioning of oral cavity and into upper portion of pharynx to remove large amounts of thick, formed, white and brown secretions).  Given the abovementioned deficits, recommend that pt remain NPO overnight with plans for objective swallow assessment tomorrow at earliest available appointment. Pt may have small sips of water only following oral care in the mean time.    Skilled Therapeutic Interventions          Bedside swallow evaluation completed with results and recommendations reviewed with family.     SLP Assessment  Patient will need skilled [facility] during CIR admission    Recommendations  SLP Diet Recommendations: NPO;Free water protocol after oral care Oral Care Recommendations: Oral care QID Patient destination: Home Follow up Recommendations: 24 hour supervision/assistance;Home Health SLP Equipment Recommended: To be determined    SLP Frequency 3 to 5 out of 7 days   SLP Duration  SLP Intensity  SLP  Treatment/Interventions 24-27 days   Minumum of 1-2 x/day, 30 to 90 minutes  Cognitive remediation/compensation;Cueing hierarchy;Environmental controls;Functional tasks;Internal/external aids;Medication managment;Multimodal communication approach;Patient/family education;Speech/Language facilitation;Therapeutic Exercise    Pain Pain Assessment Pain Assessment: No/denies pain  Prior Functioning Cognitive/Linguistic Baseline: Within functional limits Type of Home: House  Lives With: Spouse Available Help at Discharge: Family;Available 24 hours/day Education: 8th grade Vocation: Retired  Function:  Eating Eating   Modified Consistency Diet:  (NPO with IV fluids; trials of POs with SLP ) Eating Assist Level: Help managing cup/glass;Help with picking up utensils           Cognition Comprehension Comprehension assist level: Understands basic 75 - 89% of the time/ requires cueing 10 - 24% of the time  Expression   Expression assist level: Expresses basic 25 - 49% of the time/requires cueing 50 - 75% of the time. Uses single words/gestures.  Social Interaction Social Interaction assist level: Interacts appropriately 25 - 49% of time - Needs frequent redirection.  Problem Solving Problem solving assist level: Solves basic 25 - 49% of the time - needs direction more than half the time to initiate, plan or complete simple activities  Memory Memory assist level: Recognizes or recalls 25 - 49% of the time/requires cueing 50 - 75% of the time   Short Term Goals: Week 1: SLP Short Term  Goal 1 (Week 1): Patient will recall new daily information such an meds, functions and frequency with Min verbal cues to utilize external aids SLP Short Term Goal 2 (Week 1): Patient will solve problems realted to self-care, taking into account new deficits with Min verbal cues   SLP Short Term Goal 3 (Week 1): Patient will utilize word finding strategies during structured phrase-sentence level tasks with Mod  verbal cues   SLP Short Term Goal 4 (Week 1): Patient will susutain attention to functional tasks for 8-10 minutnes with Min verbal cue for redirection   Refer to Care Plan for Long Term Goals  Recommendations for other services: None  Discharge Criteria: Patient will be discharged from SLP if patient refuses treatment 3 consecutive times without medical reason, if treatment goals not met, if there is a change in medical status, if patient makes no progress towards goals or if patient is discharged from hospital.  The above assessment, treatment plan, treatment alternatives and goals were discussed and mutually agreed upon: by patient and by family  Jehieli Brassell, [name] 06/28/2015, 2:28 PM

## 2015-06-28 NOTE — Progress Notes (Addendum)
ANTICOAGULATION CONSULT NOTE - Initial Consult  Pharmacy Consult for heparin Indication: atrial fibrillation  No Known Allergies  Patient Measurements: Weight: 206 lb 8 oz (93.668 kg) Heparin Dosing Weight: 93.7 kg  Vital Signs: Temp: 98.2 F (36.8 C) (06/19 0450) Temp Source: Oral (06/19 0450) BP: 122/70 mmHg (06/19 1239) Pulse Rate: 82 (06/19 0450)  Labs:  Recent Labs  06/26/15 1806 06/26/15 2029  HGB 8.1*  --   HCT 26.5*  --   PLT 270  --   APTT  --  31  LABPROT  --  14.6  INR  --  1.12  CREATININE 1.26*  --     Estimated Creatinine Clearance: 60.2 mL/min (by C-G formula based on Cr of 1.26).   Medical History: Past Medical History  Diagnosis Date  . Coronary artery disease   . Pneumonia 2000  . Arthritis   . 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 Bruises easily   . GERD (gastroesophageal reflux disease)     takes Omeprazole daily as needed for stomach pain  . Blood transfusion 2001  . Diabetes mellitus     takes Metformin daily;  . Impaired hearing     bil hearing aide  . Macular degeneration     being watched for this but hasn't been "completely" diagnosed  . Myocardial infarction ([place]) 2001  . Cancer ([facility])     Medications:  Scheduled:  . amiodarone  200 mg Oral Daily  . antiseptic oral rinse  7 mL Mouth Rinse BID  . atorvastatin  40 mg Oral q1800  . cefTRIAXone (ROCEPHIN)  IV  1 g Intravenous Q24H  . diltiazem  60 mg Oral BID  . glimepiride  1 mg Oral QAC breakfast  . insulin aspart  0-24 Units Subcutaneous TID AC & HS  . lactose free nutrition  237 mL Oral TID WC  . levETIRAcetam  1,000 mg Intravenous Q12H  . metFORMIN  1,000 mg Oral Q breakfast  . metFORMIN  500 mg Oral Q supper  . pantoprazole  40 mg Oral Daily   Infusions:  . dextrose 5 % and 0.45 % NaCl with KCl 20 mEq/L 125 mL/hr at 06/27/15 1857    Assessment: 75 yo male with afib will be started on heparin therapy.  Patient had recent ICH/SAH while on Eliquis; the  dose of eliquis was few weeks ago.  Neuro is ok to start heparin but no bolus.  Hgb 8.1 and Plt 270 K on 06/17  Goal of Therapy:   Heparin level 0.3-0.5 units/ml Monitor platelets by anticoagulation protocol: Yes   Plan:  - heparin 1300 units/hr. No bolus - 8 hr heparin level - daily heparin level and CBC - watch for signs of bleeding  Ohn Bostic, Tsz-Yin 06/28/2015,2:14 PM

## 2015-06-28 NOTE — Progress Notes (Signed)
Neurology consulted to review MRI of the brain and await appropriate recommendations of acute left MCA distribution infarct

## 2015-06-28 NOTE — Progress Notes (Signed)
Subjective/Complaints: Events of weekend noted. Speech is more slurred. Patient is emotionally labile. Review of systems aphasic limiting review Objective: Vital Signs: Blood pressure 136/54, pulse 82, temperature 98.2 F (36.8 C), temperature source Oral, resp. rate 18, weight 93.668 kg (206 lb 8 oz), SpO2 97 %. Dg Chest 2 View  06/26/2015  CLINICAL DATA:  C/o chronic cough. 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 Rales heard in right lower lung. 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 EXAM: CHEST  2 VIEW COMPARISON:  06/25/2015 FINDINGS: There stable changes from prior CABG surgery. Cardiac silhouette is mildly enlarged. Focal irregular opacity in the posterior perihilar region of the left lung is stable. There are no new areas of lung opacification. No pleural effusion or pneumothorax. Right anterior chest wall Port-A-Cath is stable. Bony thorax is intact. IMPRESSION: 1. No acute cardiopulmonary disease. 2. No change from the prior study. Persistent focal opacity in the perihilar region of the left lung consistent with the reported squamous cell lung carcinoma. Electronically Signed   By: Lajean Manes M.D.   On: 06/26/2015 16:36   Ct Head Wo Contrast  06/26/2015  CLINICAL DATA:  Slurred speech. EXAM: CT HEAD WITHOUT CONTRAST TECHNIQUE: Contiguous axial images were obtained from the base of the skull through the vertex without intravenous contrast. COMPARISON:  MRI of June 14, 2015.  CT scan of June 13, 2015. FINDINGS: Bony calvarium appears intact.  Sphenoid sinusitis is noted. Mild diffuse cortical atrophy is noted. There is continued presence of left superior parietal intraparenchymal hemorrhage which now measures 21 x 11 mm and is slightly decreased in size compared to prior exam. Subarachnoid hemorrhage has resolved. There is continued surrounding vasogenic edema. No significant midline shift is noted. Ventricular size is unremarkable. IMPRESSION:  Mild diffuse cortical atrophy. Continued presence of left parietal intraparenchymal hemorrhage with surrounding vasogenic edema. This is improved compared to prior exam. Subarachnoid hemorrhage is no longer present. Electronically Signed   By: Marijo Conception, M.D.   On: 06/26/2015 14:29   [name] FH Contrast  06/26/2015  CLINICAL DATA:  Initial evaluation for progressively garbled and slurred speech, with increased right-sided facial droop. EXAM: MRI HEAD WITHOUT AND WITH CONTRAST TECHNIQUE: Multiplanar, multiecho pulse sequences of the brain and surrounding structures were obtained without and with intravenous contrast. CONTRAST:  62m MULTIHANCE GADOBENATE DIMEGLUMINE 529 MG/ML IV SOLN COMPARISON:  Prior CT from earlier the same day as well as previous MRI from 06/14/2015. FINDINGS: Generalized cerebral atrophy with minimal chronic small vessel ischemic changes again noted, stable. Small remote lacunar infarct within the right thalamus. Previously identified posterior left frontal lobe complex hemorrhagic lesion again seen, overall likely decreased in size from previous. Lesion measures 13 x 21 x 16 mm on T2 weighted sequences. Surrounding vasogenic edema is similar. Breakthrough subarachnoid hemorrhage within the adjacent cortical sulci again seen, improved. There is increased peripheral enhancement about the margin of the hemorrhagic lesion, likely related to the evolving hematoma. Previously noted small hemorrhagic nodule is not as clearly delineated on this exam. Note again made of a second possible enhancing lesion within the anterior left parietal lobe along the dura, measuring approximately 9 x 11 x 11 mm. Surrounding vasogenic edema. No other new enhancing lesions. Immediately lateral and inferior to the dominant hemorrhagic lesion. There is a focus of abnormal restricted diffusion involving the cortical gray matter and underlying white matter of the posterior left frontal region, compatible with  acute ischemic infarct (  series 4, image 35). Associated signal loss on ADC map. This appears to be more vascular distribution rather than seizure related. Additional small sub cm focus of restricted diffusion more posteriorly within the left centrum semi ovale. No significant mass effect. Area of infarction closely approximates the hemorrhagic lesion can subarachnoid hemorrhage, but does not appear to be hemorrhagic in of itself. No other acute infarct. Gray-white matter differentiation otherwise maintained. Major intracranial vascular flow voids are preserved. No midline shift. No hydrocephalus. No extra-axial fluid collection. Again, major dural sinuses appear to be grossly patent. Craniocervical junction within normal limits. Visualized upper cervical spine unremarkable. Pituitary gland within normal limits. No acute abnormality about the globes and orbits. Patient is status post lens extraction bilaterally. Mucosal thickening with fluid level within the left sphenoid sinus, similar to prior. Mild scattered opacity within the anterior ethmoidal air cells. Bilateral mastoid effusions present. Visualize bone marrow within normal limits. Scalp soft tissues demonstrate no acute abnormality. IMPRESSION: 1. Acute ischemic left MCA territory infarct adjacent to the posterior left frontal hemorrhagic lesion. Vasospasm as an underlying etiology is suspected given the adjacent complex hemorrhagic lesion and subarachnoid hemorrhage. 2. Continued interval evolution of complex left frontal lobe hemorrhagic lesion with similar vasogenic edema. Associated breakthrough subarachnoid hemorrhage is improved. This lesion now demonstrates peripheral enhancement, which would be expected with an evolving hematoma. Previously noted small nodular focus of enhancement not well delineated on this exam. Again, continued MR surveillance at the hemorrhage clears is recommended to ensure no underlying lesion is present. 3. Additional 9 x 11 x  11 mm enhancing lesion within the anterior left parietal lobe, similar to prior. Attention at follow-up recommended. 4. Acute left sphenoid sinus disease with bilateral mastoid effusions, similar to prior. Electronically Signed   By: Jeannine Boga M.D.   On: 06/26/2015 23:30   Results for orders placed or performed during the hospital encounter of 06/25/2015 (from the past 72 hour(s))  Glucose, capillary     Status: Abnormal   Collection Time: 06/25/15 12:09 PM  Result Value Ref Range   Glucose-Capillary 143 (H) 65 - 99 mg/dL  Glucose, capillary     Status: Abnormal   Collection Time: 06/25/15  4:26 PM  Result Value Ref Range   Glucose-Capillary 113 (H) 65 - 99 mg/dL  Glucose, capillary     Status: Abnormal   Collection Time: 06/25/15  9:37 PM  Result Value Ref Range   Glucose-Capillary 126 (H) 65 - 99 mg/dL  Glucose, capillary     Status: Abnormal   Collection Time: 06/26/15  6:54 AM  Result Value Ref Range   Glucose-Capillary 130 (H) 65 - 99 mg/dL  Glucose, capillary     Status: Abnormal   Collection Time: 06/26/15 11:54 AM  Result Value Ref Range   Glucose-Capillary 170 (H) 65 - 99 mg/dL   Comment 1 Notify RN   Glucose, capillary     Status: None   Collection Time: 06/26/15  4:40 PM  Result Value Ref Range   Glucose-Capillary 85 65 - 99 mg/dL   Comment 1 Notify RN   Urinalysis, Routine w reflex microscopic (not at [facility])     Status: Abnormal   Collection Time: 06/26/15  5:17 PM  Result Value Ref Range   Color, Urine YELLOW YELLOW   APPearance CLOUDY (A) CLEAR   Specific Gravity, Urine 1.015 1.005 - 1.030   pH 5.5 5.0 - 8.0   Glucose, UA NEGATIVE NEGATIVE mg/dL   Hgb urine dipstick NEGATIVE NEGATIVE  Bilirubin Urine NEGATIVE NEGATIVE   Ketones, ur NEGATIVE NEGATIVE mg/dL   Protein, ur NEGATIVE NEGATIVE mg/dL   Nitrite POSITIVE (A) NEGATIVE   Leukocytes, UA LARGE (A) NEGATIVE  Culture, Urine     Status: Abnormal   Collection Time: 06/26/15  5:17 PM  Result Value Ref  Range   Specimen Description URINE, CATHETERIZED    Special Requests NONE    Culture >=100,000 COLONIES/mL ENTEROBACTER CLOACAE (A)    Report Status 06/28/2015 FINAL    Organism ID, Bacteria ENTEROBACTER CLOACAE (A)       Susceptibility   Enterobacter cloacae - MIC*    CEFAZOLIN >=64 RESISTANT Resistant     CEFTRIAXONE 8 SENSITIVE Sensitive     CIPROFLOXACIN <=0.25 SENSITIVE Sensitive     GENTAMICIN <=1 SENSITIVE Sensitive     IMIPENEM <=0.25 SENSITIVE Sensitive     NITROFURANTOIN <=16 SENSITIVE Sensitive     TRIMETH/SULFA <=20 SENSITIVE Sensitive     PIP/TAZO 32 INTERMEDIATE Intermediate     * >=100,000 COLONIES/mL ENTEROBACTER CLOACAE  Urine microscopic-add on     Status: Abnormal   Collection Time: 06/26/15  5:17 PM  Result Value Ref Range   Squamous Epithelial / LPF 0-5 (A) NONE SEEN   WBC, UA TOO NUMEROUS TO COUNT 0 - 5 WBC/hpf   RBC / HPF 0-5 0 - 5 RBC/hpf   Bacteria, UA MANY (A) NONE SEEN  CBC     Status: Abnormal   Collection Time: 06/26/15  6:06 PM  Result Value Ref Range   WBC 7.0 4.0 - 10.5 K/uL   RBC 3.07 (L) 4.22 - 5.81 MIL/uL   Hemoglobin 8.1 (L) 13.0 - 17.0 g/dL   HCT 26.5 (L) 39.0 - 52.0 %   MCV 86.3 78.0 - 100.0 fL   MCH 26.4 26.0 - 34.0 pg   MCHC 30.6 30.0 - 36.0 g/dL   RDW 18.2 (H) 11.5 - 15.5 %   Platelets 270 150 - 400 K/uL  Comprehensive metabolic panel     Status: Abnormal   Collection Time: 06/26/15  6:06 PM  Result Value Ref Range   Sodium 136 135 - 145 mmol/L   Potassium 4.2 3.5 - 5.1 mmol/L   Chloride 104 101 - 111 mmol/L   CO2 22 22 - 32 mmol/L   Glucose, Bld 78 65 - 99 mg/dL   BUN 17 6 - 20 mg/dL   Creatinine, Ser 1.26 (H) 0.61 - 1.24 mg/dL   Calcium 9.4 8.9 - 10.3 mg/dL   Total Protein 6.8 6.5 - 8.1 g/dL   Albumin 3.2 (L) 3.5 - 5.0 g/dL   AST 19 15 - 41 U/L   ALT 19 17 - 63 U/L   Alkaline Phosphatase 114 38 - 126 U/L   Total Bilirubin 0.3 0.3 - 1.2 mg/dL   GFR calc non Af Amer 54 (L) >60 mL/min   GFR calc Af Amer >60 >60 mL/min     Comment: (NOTE) The eGFR has been calculated using the CKD EPI equation. This calculation has not been validated in all clinical situations. eGFR's persistently <60 mL/min signify possible Chronic Kidney Disease.    Anion gap 10 5 - 15  Culture, blood (routine x 2)     Status: None (Preliminary result)   Collection Time: 06/26/15  6:06 PM  Result Value Ref Range   Specimen Description BLOOD LEFT ANTECUBITAL    Special Requests BOTTLES DRAWN AEROBIC AND ANAEROBIC 5CC    Culture NO GROWTH < 24 HOURS    Report  Status PENDING   Culture, blood (routine x 2)     Status: None (Preliminary result)   Collection Time: 06/26/15  6:41 PM  Result Value Ref Range   Specimen Description BLOOD LEFT HAND    Special Requests BOTTLES DRAWN AEROBIC AND ANAEROBIC 5CC    Culture NO GROWTH < 24 HOURS    Report Status PENDING   Lactic acid, plasma     Status: Abnormal   Collection Time: 06/26/15  7:11 PM  Result Value Ref Range   Lactic Acid, Venous 2.1 (HH) 0.5 - 2.0 mmol/L    Comment: CRITICAL RESULT CALLED TO, READ BACK BY AND VERIFIED WITH: MURRAY,J RN 06/26/15 1948 WOOTEN,K   Glucose, capillary     Status: None   Collection Time: 06/26/15  8:01 PM  Result Value Ref Range   Glucose-Capillary 88 65 - 99 mg/dL  Protime-INR     Status: None   Collection Time: 06/26/15  8:29 PM  Result Value Ref Range   Prothrombin Time 14.6 11.6 - 15.2 seconds   INR 1.12 0.00 - 1.49  APTT     Status: None   Collection Time: 06/26/15  8:29 PM  Result Value Ref Range   aPTT 31 24 - 37 seconds  Glucose, capillary     Status: None   Collection Time: 06/26/15 11:40 PM  Result Value Ref Range   Glucose-Capillary 86 65 - 99 mg/dL  Lactic acid, plasma     Status: None   Collection Time: 06/26/15 11:45 PM  Result Value Ref Range   Lactic Acid, Venous 1.0 0.5 - 2.0 mmol/L  Glucose, capillary     Status: Abnormal   Collection Time: 06/27/15  4:11 AM  Result Value Ref Range   Glucose-Capillary 104 (H) 65 - 99 mg/dL   Glucose, capillary     Status: Abnormal   Collection Time: 06/27/15 10:27 AM  Result Value Ref Range   Glucose-Capillary 127 (H) 65 - 99 mg/dL  Glucose, capillary     Status: Abnormal   Collection Time: 06/27/15 11:32 AM  Result Value Ref Range   Glucose-Capillary 152 (H) 65 - 99 mg/dL  Glucose, capillary     Status: Abnormal   Collection Time: 06/27/15  4:25 PM  Result Value Ref Range   Glucose-Capillary 174 (H) 65 - 99 mg/dL  Glucose, capillary     Status: Abnormal   Collection Time: 06/27/15  8:28 PM  Result Value Ref Range   Glucose-Capillary 131 (H) 65 - 99 mg/dL  Glucose, capillary     Status: Abnormal   Collection Time: 06/27/15 11:54 PM  Result Value Ref Range   Glucose-Capillary 116 (H) 65 - 99 mg/dL  Glucose, capillary     Status: Abnormal   Collection Time: 06/28/15  4:12 AM  Result Value Ref Range   Glucose-Capillary 154 (H) 65 - 99 mg/dL     HEENT: normal Cardio: irregular irregular Resp: CTA B/L and unlabored GI: BS positive and nontender nondistended Extremity:  No Edema Skin:   Intact Neuro: Flat, Abnormal Sensory cannot assess secondary to aphasia, Abnormal Motor 2 minus finger flexors, biceps and triceps , 0 at deltoid, 2 minus at the hip knee extensors as well as plantar flexors. 0 at the ankle dorsiflexors., Dysarthric and Aphasic, increased facial droop Musc/Skel:  Normal Gen. no acute distress   Assessment/Plan: 1. 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 Physiatrist is providing close team supervision and 24 hour management of active medical problems listed below. Physiatrist and rehab team continue to assess barriers to discharge/monitor patient progress toward functional and medical goals. FIM: Function - Bathing Position: Shower Body parts bathed by patient: Chest, Abdomen, Front perineal area, Right  upper leg, Left upper leg Body parts bathed by helper: Right arm, Left arm, Buttocks, Right lower leg, Left lower leg, Back Bathing not applicable: Right upper leg Assist Level:  (Mod assist, all completed at seated level on roll-in shower chair)  Function- Upper Body Dressing/Undressing What is the patient wearing?: Pull over shirt/dress Pull over shirt/dress - Perfomed by patient: Put head through opening, Thread/unthread right sleeve Pull over shirt/dress - Perfomed by helper: Thread/unthread left sleeve, Pull shirt over trunk Assist Level:  (Mod assist) Function - Lower Body Dressing/Undressing What is the patient wearing?: Pants, Non-skid slipper socks Position: Bed Pants- Performed by helper: Thread/unthread right pants leg, Thread/unthread left pants leg, Pull pants up/down Non-skid slipper socks- Performed by helper: Don/doff right sock, Don/doff left sock Assist for footwear: Dependant Assist for lower body dressing:  (Total assist)  Function - Toileting Toileting activity did not occur: No continent bowel/bladder event Assist level: Two helpers (per [employer], NT)  Function - Air cabin crew transfer activity did not occur: Safety/medical concerns Assist level to toilet: 2 helpers (per Berkley Harvey, NT) Assist level from toilet: 2 helpers  Function - Chair/bed transfer Chair/bed transfer activity did not occur: Safety/medical concerns Chair/bed transfer method: Lateral scoot Chair/bed transfer assist level: Moderate assist (Pt 50 - 74%/lift or lower) Chair/bed transfer assistive device: Sliding board Mechanical lift: Stedy Chair/bed transfer details: Verbal cues for precautions/safety, Manual facilitation for weight shifting, Manual facilitation for placement, Verbal cues for sequencing, Verbal cues for technique, Tactile cues for placement, Tactile cues for posture  Function - Locomotion: Wheelchair Will patient use wheelchair at discharge?: Yes Type:  Manual Max wheelchair distance: 75 Assist Level: Maximal assistance (Pt 25 - 49%) Assist Level: Maximal assistance (Pt 25 - 49%) Wheel 150 feet activity did not occur: Safety/medical concerns Turns around,maneuvers to table,bed, and toilet,negotiates 3% grade,maneuvers on rugs and over doorsills: No Function - Locomotion: Ambulation Ambulation activity did not occur: Safety/medical concerns Assistive device: Rail in hallway Max distance: 25 Assist level: Maximal assist (Pt 25 - 49%) Assist level: Maximal assist (Pt 25 - 49%)  Function - Comprehension Comprehension: Auditory Comprehension assistive device: Hearing aids Comprehension assist level: Follows complex conversation/direction with no assist  Function - Expression Expression: Verbal Expression assist level: Expresses basic 90% of the time/requires cueing < 10% of the time.  Function - Social Interaction Social Interaction assist level: Interacts appropriately 75 - 89% of the time - Needs redirection for appropriate language or to initiate interaction.  Function - Problem Solving Problem solving assist level: Solves basic 75 - 89% of the time/requires cueing 10 - 24% of the time  Function - Memory Memory assist level: Recognizes or recalls 50 - 74% of the time/requires cueing 25 - 49% of the time Patient normally able to recall (first 3 days only): Current season    Medical Problem List and Plan: 1.  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 Plan to follow-up MRI of the brain after ICH absorbed for further evaluation of possible metastases posterior left frontal lobe             - Now with acute left MCA distribution infarct. Will ask  neurology to reevaluate. Unfortunately he has atrial fibrillation and has been off anticoagulation due to intracranial hemorrhage.Repeat MRI still not definite whether there is underlying metastasis. 2.  DVT Prophylaxis/Anticoagulation: SCDs. Monitor  for signs of DVT 3. Pain Management: Ultram and oxycodone as needed 4. Mood: Xanax 0.25 mg 3 times daily as needed 5. Neuropsych: This patient is capable of making decisions on his own behalf with caregiver/staff assistance. 6. Skin/Wound Care: Routine skin checks 7. Fluids/Electrolytes/Nutrition: Routine I&O's with follow-up chemistries, Speech to reevaluate swallowing may need NG tube if nothing by mouth status is needed. He is nothing by mouth for now. 8. Seizure prophylaxis. Keppra now at 1000 mg twice a day---more alert. EEG displayed seizure activity 9. Atrial fibrillation. Cardiac rate control. Continue amiodarone 200 mg daily, Cardizem 60 mg twice a day.Eliquis held secondary to [place]:   06/27/15 1428 06/28/15 0450  BP: 126/59 136/54  Pulse: 87 82  Temp: 98.6 F (37 C) 98.2 F (36.8 C)  Resp: 18 18   10. 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 This is likely the etiology of the persistent cough, Right lung nodules,Repeat chest x-ray shows right upper lobe mass which was noted on previous x-rays.As noted plan follow-up MRI after ICH absorbed for further evaluation of possible metastasis 11. Diabetes mellitus with peripheral neuropathy. Hemoglobin A1c 6.8. Glucophage 1000 mg at breakfast and 500 mg supper , Generally well controlled on the current regimen Amaryl 1 mg daily. Check blood sugars before meals and at bedtime CBG (last 3) generally controlled  Recent Labs  06/27/15 2028 06/27/15 2354 06/28/15 0412  GLUCAP 131* 116* 154*    12. Hyperlipidemia. Lipitor 13. UTI enterobacter cloacae- Sensitive to ceftriaxone. We'll start 1 g every 24 hours.  Will discontinue vancomycin and Zosyn  LOS (Days) 7 A FACE TO FACE EVALUATION WAS PERFORMED  Corby Vandenberghe E 06/28/2015, 8:45 AM

## 2015-06-28 NOTE — Progress Notes (Signed)
Occupational Therapy Session Note  Patient Details  Name: Ivan Beasley MRN: [id number] Date of Birth: 10/03/1940  Today's Date: 06/28/2015 OT Individual Time: 5945-8592 OT Individual Time Calculation (min): 55 min    Short Term Goals: Week 1:  OT Short Term Goal 1 (Week 1): Pt will demonstrate increased awareness to RUE by positioning appropriately to decrease shoulder subluxation OT Short Term Goal 2 (Week 1): Pt will completed squat pivot transfer to toilet with max assist of one caregiver OT Short Term Goal 3 (Week 1): Pt will complete UB dressing with mod assist OT Short Term Goal 4 (Week 1): Pt will complete LB dressing with max assist of one caregiver at sit > stand level OT Short Term Goal 5 (Week 1): Pt will complete bathing with mod assist at sit > stand level  Skilled Therapeutic Interventions/Progress Updates:    Treatment session with focus on bed mobility, static sitting balance, trunk control, and RUE NMR.  Pt in bed upon arrival, noted increased slurring of speech as compared to Friday's session.  Completed rolling at bed level with increased cues for sequencing and hand over hand assist for positioning approx 50% of time.  Donned shorts at bed level due to requiring increased cues and assist.  Attempted sitting at EOB with pt requiring max assist to maintain upright sitting balance, noted pt to be pulling with LUE on bed rail due to fearfulness in sitting and posterior pelvic tilt.  Unable to maintain sitting balance at EOB without increased assist, with pt tending to lean back toward bed either posteriorly or to Lt.  Returned to bed with +2 assist to scoot up in bed.  Pt with no grasp in RUE this session and with no bicep/tricep activation (as compared to trace-minimal during Friday session). Therapist applied dentures after pt attempted to apply but placing them turned 90 degrees to Lt with decreased awareness.  Left semi-reclined in bed with all needs in reach.  Therapy  Documentation Precautions:  Precautions Precautions: Fall Precaution Comments: R hemi, pt on home O2 PTA for recent pneumonia (but used it PRN, not 24/7) Restrictions Weight Bearing Restrictions: No Pain: Pain Assessment Pain Assessment: No/denies pain Pain Score: 0-No pain  See Function Navigator for Current Functional Status.   Therapy/Group: Individual Therapy  [name] 06/28/2015, 10:13 AM

## 2015-06-29 ENCOUNTER — Inpatient Hospital Stay (HOSPITAL_COMMUNITY): Payer: Medicare HMO | Admitting: Physical Therapy

## 2015-06-29 ENCOUNTER — Inpatient Hospital Stay (HOSPITAL_COMMUNITY): Payer: Medicare HMO

## 2015-06-29 ENCOUNTER — Inpatient Hospital Stay (HOSPITAL_COMMUNITY): Payer: Medicare HMO | Admitting: Occupational Therapy

## 2015-06-29 ENCOUNTER — Inpatient Hospital Stay (HOSPITAL_COMMUNITY): Payer: Medicare HMO | Admitting: Speech Pathology

## 2015-06-29 DIAGNOSIS — Z7901 Long term (current) use of anticoagulants: Secondary | ICD-10-CM | POA: Insufficient documentation

## 2015-06-29 DIAGNOSIS — C3492 Malignant neoplasm of unspecified part of left bronchus or lung: Secondary | ICD-10-CM | POA: Insufficient documentation

## 2015-06-29 DIAGNOSIS — I63412 Cerebral infarction due to embolism of left middle cerebral artery: Secondary | ICD-10-CM

## 2015-06-29 LAB — GLUCOSE, CAPILLARY
GLUCOSE-CAPILLARY: 163 mg/dL — AB (ref 65–99)
GLUCOSE-CAPILLARY: 189 mg/dL — AB (ref 65–99)
Glucose-Capillary: 117 mg/dL — ABNORMAL HIGH (ref 65–99)
Glucose-Capillary: 140 mg/dL — ABNORMAL HIGH (ref 65–99)
Glucose-Capillary: 153 mg/dL — ABNORMAL HIGH (ref 65–99)
Glucose-Capillary: 194 mg/dL — ABNORMAL HIGH (ref 65–99)

## 2015-06-29 LAB — CBC
HEMATOCRIT: 25.7 % — AB (ref 39.0–52.0)
HEMOGLOBIN: 8.1 g/dL — AB (ref 13.0–17.0)
MCH: 27.2 pg (ref 26.0–34.0)
MCHC: 31.5 g/dL (ref 30.0–36.0)
MCV: 86.2 fL (ref 78.0–100.0)
Platelets: 243 10*3/uL (ref 150–400)
RBC: 2.98 MIL/uL — AB (ref 4.22–5.81)
RDW: 17.9 % — AB (ref 11.5–15.5)
WBC: 5.3 10*3/uL (ref 4.0–10.5)

## 2015-06-29 LAB — BASIC METABOLIC PANEL
ANION GAP: 9 (ref 5–15)
BUN: <5 mg/dL — ABNORMAL LOW (ref 6–20)
CALCIUM: 9.1 mg/dL (ref 8.9–10.3)
CHLORIDE: 104 mmol/L (ref 101–111)
CO2: 22 mmol/L (ref 22–32)
Creatinine, Ser: 1.09 mg/dL (ref 0.61–1.24)
GFR calc non Af Amer: >60 mL/min (ref >60)
GLUCOSE: 173 mg/dL — AB (ref 65–99)
POTASSIUM: 3.8 mmol/L (ref 3.5–5.1)
Sodium: 135 mmol/L (ref 135–145)

## 2015-06-29 LAB — HEPARIN LEVEL (UNFRACTIONATED)
HEPARIN UNFRACTIONATED: 0.58 [IU]/mL (ref 0.30–0.70)
Heparin Unfractionated: 0.51 IU/mL (ref 0.30–0.70)

## 2015-06-29 NOTE — Progress Notes (Signed)
Subjective/Complaints: Appreciate excellent neurology note Appreciate speech therapy reevaluation of swallowing Review of systems aphasic limiting review Objective: Vital Signs: Blood pressure 127/53, pulse 92, temperature 98.1 F (36.7 C), temperature source Axillary, resp. rate 18, weight 93.668 kg (206 lb 8 oz), SpO2 99 %. No results found. Results for orders placed or performed during the hospital encounter of 07/08/2015 (from the past 72 hour(s))  Glucose, capillary     Status: Abnormal   Collection Time: 06/26/15 11:54 AM  Result Value Ref Range   Glucose-Capillary 170 (H) 65 - 99 mg/dL   Comment 1 Notify RN   Glucose, capillary     Status: None   Collection Time: 06/26/15  4:40 PM  Result Value Ref Range   Glucose-Capillary 85 65 - 99 mg/dL   Comment 1 Notify RN   Urinalysis, Routine w reflex microscopic (not at [facility])     Status: Abnormal   Collection Time: 06/26/15  5:17 PM  Result Value Ref Range   Color, Urine YELLOW YELLOW   APPearance CLOUDY (A) CLEAR   Specific Gravity, Urine 1.015 1.005 - 1.030   pH 5.5 5.0 - 8.0   Glucose, UA NEGATIVE NEGATIVE mg/dL   Hgb urine dipstick NEGATIVE NEGATIVE   Bilirubin Urine NEGATIVE NEGATIVE   Ketones, ur NEGATIVE NEGATIVE mg/dL   Protein, ur NEGATIVE NEGATIVE mg/dL   Nitrite POSITIVE (A) NEGATIVE   Leukocytes, UA LARGE (A) NEGATIVE  Culture, Urine     Status: Abnormal   Collection Time: 06/26/15  5:17 PM  Result Value Ref Range   Specimen Description URINE, CATHETERIZED    Special Requests NONE    Culture >=100,000 COLONIES/mL ENTEROBACTER CLOACAE (A)    Report Status 06/28/2015 FINAL    Organism ID, Bacteria ENTEROBACTER CLOACAE (A)       Susceptibility   Enterobacter cloacae - MIC*    CEFAZOLIN >=64 RESISTANT Resistant     CEFTRIAXONE 8 SENSITIVE Sensitive     CIPROFLOXACIN <=0.25 SENSITIVE Sensitive     GENTAMICIN <=1 SENSITIVE Sensitive     IMIPENEM <=0.25 SENSITIVE Sensitive     NITROFURANTOIN <=16 SENSITIVE  Sensitive     TRIMETH/SULFA <=20 SENSITIVE Sensitive     PIP/TAZO 32 INTERMEDIATE Intermediate     * >=100,000 COLONIES/mL ENTEROBACTER CLOACAE  Urine microscopic-add on     Status: Abnormal   Collection Time: 06/26/15  5:17 PM  Result Value Ref Range   Squamous Epithelial / LPF 0-5 (A) NONE SEEN   WBC, UA TOO NUMEROUS TO COUNT 0 - 5 WBC/hpf   RBC / HPF 0-5 0 - 5 RBC/hpf   Bacteria, UA MANY (A) NONE SEEN  CBC     Status: Abnormal   Collection Time: 06/26/15  6:06 PM  Result Value Ref Range   WBC 7.0 4.0 - 10.5 K/uL   RBC 3.07 (L) 4.22 - 5.81 MIL/uL   Hemoglobin 8.1 (L) 13.0 - 17.0 g/dL   HCT 26.5 (L) 39.0 - 52.0 %   MCV 86.3 78.0 - 100.0 fL   MCH 26.4 26.0 - 34.0 pg   MCHC 30.6 30.0 - 36.0 g/dL   RDW 18.2 (H) 11.5 - 15.5 %   Platelets 270 150 - 400 K/uL  Comprehensive metabolic panel     Status: Abnormal   Collection Time: 06/26/15  6:06 PM  Result Value Ref Range   Sodium 136 135 - 145 mmol/L   Potassium 4.2 3.5 - 5.1 mmol/L   Chloride 104 101 - 111 mmol/L   CO2 22  22 - 32 mmol/L   Glucose, Bld 78 65 - 99 mg/dL   BUN 17 6 - 20 mg/dL   Creatinine, Ser 1.26 (H) 0.61 - 1.24 mg/dL   Calcium 9.4 8.9 - 10.3 mg/dL   Total Protein 6.8 6.5 - 8.1 g/dL   Albumin 3.2 (L) 3.5 - 5.0 g/dL   AST 19 15 - 41 U/L   ALT 19 17 - 63 U/L   Alkaline Phosphatase 114 38 - 126 U/L   Total Bilirubin 0.3 0.3 - 1.2 mg/dL   GFR calc non Af Amer 54 (L) >60 mL/min   GFR calc Af Amer >60 >60 mL/min    Comment: (NOTE) The eGFR has been calculated using the CKD EPI equation. This calculation has not been validated in all clinical situations. eGFR's persistently <60 mL/min signify possible Chronic Kidney Disease.    Anion gap 10 5 - 15  Culture, blood (routine x 2)     Status: None (Preliminary result)   Collection Time: 06/26/15  6:06 PM  Result Value Ref Range   Specimen Description BLOOD LEFT ANTECUBITAL    Special Requests BOTTLES DRAWN AEROBIC AND ANAEROBIC 5CC    Culture NO GROWTH 2 DAYS     Report Status PENDING   Culture, blood (routine x 2)     Status: None (Preliminary result)   Collection Time: 06/26/15  6:41 PM  Result Value Ref Range   Specimen Description BLOOD LEFT HAND    Special Requests BOTTLES DRAWN AEROBIC AND ANAEROBIC 5CC    Culture NO GROWTH 2 DAYS    Report Status PENDING   Lactic acid, plasma     Status: Abnormal   Collection Time: 06/26/15  7:11 PM  Result Value Ref Range   Lactic Acid, Venous 2.1 (HH) 0.5 - 2.0 mmol/L    Comment: CRITICAL RESULT CALLED TO, READ BACK BY AND VERIFIED WITH: MURRAY,J RN 06/26/15 1948 WOOTEN,K   Glucose, capillary     Status: None   Collection Time: 06/26/15  8:01 PM  Result Value Ref Range   Glucose-Capillary 88 65 - 99 mg/dL  Protime-INR     Status: None   Collection Time: 06/26/15  8:29 PM  Result Value Ref Range   Prothrombin Time 14.6 11.6 - 15.2 seconds   INR 1.12 0.00 - 1.49  APTT     Status: None   Collection Time: 06/26/15  8:29 PM  Result Value Ref Range   aPTT 31 24 - 37 seconds  Glucose, capillary     Status: None   Collection Time: 06/26/15 11:40 PM  Result Value Ref Range   Glucose-Capillary 86 65 - 99 mg/dL  Lactic acid, plasma     Status: None   Collection Time: 06/26/15 11:45 PM  Result Value Ref Range   Lactic Acid, Venous 1.0 0.5 - 2.0 mmol/L  Glucose, capillary     Status: Abnormal   Collection Time: 06/27/15  4:11 AM  Result Value Ref Range   Glucose-Capillary 104 (H) 65 - 99 mg/dL  Glucose, capillary     Status: Abnormal   Collection Time: 06/27/15 10:27 AM  Result Value Ref Range   Glucose-Capillary 127 (H) 65 - 99 mg/dL  Glucose, capillary     Status: Abnormal   Collection Time: 06/27/15 11:32 AM  Result Value Ref Range   Glucose-Capillary 152 (H) 65 - 99 mg/dL  Glucose, capillary     Status: Abnormal   Collection Time: 06/27/15  4:25 PM  Result Value Ref Range  Glucose-Capillary 174 (H) 65 - 99 mg/dL  Glucose, capillary     Status: Abnormal   Collection Time: 06/27/15  8:28 PM   Result Value Ref Range   Glucose-Capillary 131 (H) 65 - 99 mg/dL  Glucose, capillary     Status: Abnormal   Collection Time: 06/27/15 11:54 PM  Result Value Ref Range   Glucose-Capillary 116 (H) 65 - 99 mg/dL  Glucose, capillary     Status: Abnormal   Collection Time: 06/28/15  4:12 AM  Result Value Ref Range   Glucose-Capillary 154 (H) 65 - 99 mg/dL  Glucose, capillary     Status: Abnormal   Collection Time: 06/28/15  9:14 AM  Result Value Ref Range   Glucose-Capillary 146 (H) 65 - 99 mg/dL  Glucose, capillary     Status: Abnormal   Collection Time: 06/28/15 11:57 AM  Result Value Ref Range   Glucose-Capillary 147 (H) 65 - 99 mg/dL  Glucose, capillary     Status: Abnormal   Collection Time: 06/28/15  4:34 PM  Result Value Ref Range   Glucose-Capillary 167 (H) 65 - 99 mg/dL  Glucose, capillary     Status: Abnormal   Collection Time: 06/28/15  8:10 PM  Result Value Ref Range   Glucose-Capillary 154 (H) 65 - 99 mg/dL  Heparin level (unfractionated)     Status: Abnormal   Collection Time: 06/28/15 11:18 PM  Result Value Ref Range   Heparin Unfractionated 0.21 (L) 0.30 - 0.70 IU/mL    Comment:        IF HEPARIN RESULTS ARE BELOW EXPECTED VALUES, AND PATIENT DOSAGE HAS BEEN CONFIRMED, SUGGEST FOLLOW UP TESTING OF ANTITHROMBIN III LEVELS.   Glucose, capillary     Status: Abnormal   Collection Time: 06/29/15 12:06 AM  Result Value Ref Range   Glucose-Capillary 117 (H) 65 - 99 mg/dL  Glucose, capillary     Status: Abnormal   Collection Time: 06/29/15  4:02 AM  Result Value Ref Range   Glucose-Capillary 153 (H) 65 - 99 mg/dL  CBC     Status: Abnormal   Collection Time: 06/29/15  5:30 AM  Result Value Ref Range   WBC 5.3 4.0 - 10.5 K/uL   RBC 2.98 (L) 4.22 - 5.81 MIL/uL   Hemoglobin 8.1 (L) 13.0 - 17.0 g/dL   HCT 25.7 (L) 39.0 - 52.0 %   MCV 86.2 78.0 - 100.0 fL   MCH 27.2 26.0 - 34.0 pg   MCHC 31.5 30.0 - 36.0 g/dL   RDW 17.9 (H) 11.5 - 15.5 %   Platelets 243 150 -  400 K/uL  Basic metabolic panel     Status: Abnormal   Collection Time: 06/29/15  5:30 AM  Result Value Ref Range   Sodium 135 135 - 145 mmol/L   Potassium 3.8 3.5 - 5.1 mmol/L   Chloride 104 101 - 111 mmol/L   CO2 22 22 - 32 mmol/L   Glucose, Bld 173 (H) 65 - 99 mg/dL   BUN <5 (L) 6 - 20 mg/dL   Creatinine, Ser 1.09 0.61 - 1.24 mg/dL   Calcium 9.1 8.9 - 10.3 mg/dL   GFR calc non Af Amer >60 >60 mL/min   GFR calc Af Amer >60 >60 mL/min    Comment: (NOTE) The eGFR has been calculated using the CKD EPI equation. This calculation has not been validated in all clinical situations. eGFR's persistently <60 mL/min signify possible Chronic Kidney Disease.    Anion gap 9 5 - 15  Glucose, capillary     Status: Abnormal   Collection Time: 06/29/15  7:28 AM  Result Value Ref Range   Glucose-Capillary 140 (H) 65 - 99 mg/dL  Heparin level (unfractionated)     Status: None   Collection Time: 06/29/15  8:15 AM  Result Value Ref Range   Heparin Unfractionated 0.58 0.30 - 0.70 IU/mL    Comment:        IF HEPARIN RESULTS ARE BELOW EXPECTED VALUES, AND PATIENT DOSAGE HAS BEEN CONFIRMED, SUGGEST FOLLOW UP TESTING OF ANTITHROMBIN III LEVELS.      HEENT: normal Cardio: irregular irregular Resp: CTA B/L and unlabored GI: BS positive and nontender nondistended Extremity:  No Edema Skin:   Intact Neuro: Flat, Abnormal Sensory cannot assess secondary to aphasia, Abnormal Motor 2 minus finger flexors, biceps and triceps , 0 at deltoid, 2 minus at the hip knee extensors as well as plantar flexors. 0 at the ankle dorsiflexors., Dysarthric and Aphasic, increased facial droop Musc/Skel:  Normal Gen. no acute distress   Assessment/Plan: 1. 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 Physiatrist is providing close team supervision and 24 hour management of  active medical problems listed below. Physiatrist and rehab team continue to assess barriers to discharge/monitor patient progress toward functional and medical goals. FIM: Function - Bathing Position: Shower Body parts bathed by patient: Chest, Abdomen, Front perineal area, Right upper leg, Left upper leg Body parts bathed by helper: Right arm, Left arm, Buttocks, Right lower leg, Left lower leg, Back Bathing not applicable: Right upper leg Assist Level:  (Mod assist, all completed at seated level on roll-in shower chair)  Function- Upper Body Dressing/Undressing What is the patient wearing?: Pull over shirt/dress Pull over shirt/dress - Perfomed by patient: Put head through opening, Thread/unthread right sleeve Pull over shirt/dress - Perfomed by helper: Thread/unthread left sleeve, Pull shirt over trunk Assist Level:  (Mod assist) Function - Lower Body Dressing/Undressing What is the patient wearing?: Pants, Non-skid slipper socks Position: Bed Pants- Performed by helper: Thread/unthread right pants leg, Thread/unthread left pants leg, Pull pants up/down Non-skid slipper socks- Performed by helper: Don/doff right sock, Don/doff left sock Assist for footwear: Dependant Assist for lower body dressing:  (Total assist)  Function - Toileting Toileting activity did not occur: No continent bowel/bladder event Assist level: Two helpers (per [employer], NT)  Function - Air cabin crew transfer activity did not occur: Safety/medical concerns Assist level to toilet: 2 helpers (per Berkley Harvey, NT) Assist level from toilet: 2 helpers  Function - Chair/bed transfer Chair/bed transfer activity did not occur: Safety/medical concerns Chair/bed transfer method: Lateral scoot Chair/bed transfer assist level: 2 helpers Chair/bed transfer assistive device: Sliding board, Armrests Mechanical lift: Stedy Chair/bed transfer details: Verbal cues for precautions/safety, Manual facilitation  for weight shifting, Manual facilitation for placement, Verbal cues for sequencing, Verbal cues for technique, Tactile cues for placement, Tactile cues for posture  Function - Locomotion: Wheelchair Will patient use wheelchair at discharge?: Yes Type: Manual Max wheelchair distance: 75 Assist Level: Maximal assistance (Pt 25 - 49%) Assist Level: Maximal assistance (Pt 25 - 49%) Wheel 150 feet activity did not occur: Safety/medical concerns Turns around,maneuvers to table,bed, and toilet,negotiates 3% grade,maneuvers on rugs and over doorsills: No Function - Locomotion: Ambulation Ambulation activity did not occur: Safety/medical concerns Assistive device: Rail in hallway Max distance: 25 Assist level: Maximal assist (Pt 25 -  49%) Assist level: Maximal assist (Pt 25 - 49%)  Function - Comprehension Comprehension: Auditory Comprehension assistive device: Hearing aids Comprehension assist level: Understands basic 75 - 89% of the time/ requires cueing 10 - 24% of the time  Function - Expression Expression: Verbal Expression assist level: Expresses basic 25 - 49% of the time/requires cueing 50 - 75% of the time. Uses single words/gestures.  Function - Social Interaction Social Interaction assist level: Interacts appropriately 25 - 49% of time - Needs frequent redirection.  Function - Problem Solving Problem solving assist level: Solves basic 25 - 49% of the time - needs direction more than half the time to initiate, plan or complete simple activities  Function - Memory Memory assist level: Recognizes or recalls 25 - 49% of the time/requires cueing 50 - 75% of the time Patient normally able to recall (first 3 days only): Current season    Medical Problem List and Plan: 1.  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 Will need repeat MRI in about a week or 2 2.  DVT Prophylaxis/Anticoagulation: SCDs. Monitor for signs of DVT 3. Pain Management: Ultram and oxycodone as needed 4. Mood: Xanax 0.25 mg 3 times daily as needed 5. Neuropsych: This patient is capable of making decisions on his own behalf with caregiver/staff assistance. 6. Skin/Wound Care: Routine skin checks 7. Fluids/Electrolytes/Nutrition: Routine I&O's with follow-up chemistries, Speech to reevaluate swallowingusing objective measure today, continue nothing by mouth status for now8. Seizure prophylaxis. Keppra now at 1000 mg twice a day---more alert. EEG displayed seizure activity 9. Atrial fibrillation. Cardiac rate control. Continue amiodarone 200 mg daily, Cardizem 60 mg twice a day.Eliquis held secondary to [place]:   06/28/15 1526 06/29/15 0556  BP: 139/79 127/53  Pulse: 98 92  Temp: 97.5 F (36.4 C) 98.1 F (36.7 C)  Resp: 18 18   10. 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 This is likely the etiology of the persistent cough, Right lung nodules,As noted plan follow-up MRI after ICH absorbed for further evaluation of possible metastasis 11. Diabetes mellitus with peripheral neuropathy. Hemoglobin A1c 6.8. Glucophage 1000 mg at breakfast and 500 mg supper , Generally well controlled on the current regimen Amaryl 1 mg daily. Check blood sugars before meals and at bedtime CBG (last 3) generally controlled  Recent Labs  06/29/15 0006 06/29/15 0402 06/29/15 0728  GLUCAP 117* 153* 140*    12. Hyperlipidemia. Lipitor 13. UTI enterobacter cloacae- Sensitive to ceftriaxone. We'll start 1 g every 24 hours.  Afebrile LOS (Days) 8 A FACE TO FACE EVALUATION WAS PERFORMED  Cleophus Mendonsa E 06/29/2015, 9:15 AM

## 2015-06-29 NOTE — Progress Notes (Signed)
Speech Language Pathology Note  Patient Details  Name: Ivan Beasley MRN: [id number] Date of Birth: [birth year] Today's Date: 06/29/2015  MBSS complete. Full report located under chart review in imaging section.    Ivan Beasley, [name] 06/29/2015, 3:33 PM

## 2015-06-29 NOTE — Progress Notes (Signed)
ANTICOAGULATION CONSULT NOTE - Follow-up Consult  Pharmacy Consult for heparin Indication: atrial fibrillation  No Known Allergies  Patient Measurements: Weight: 206 lb 8 oz (93.668 kg) Heparin Dosing Weight: 93.7 kg  Vital Signs: Temp: 97.5 F (36.4 C) (06/19 1526) Temp Source: Oral (06/19 1526) BP: 139/79 mmHg (06/19 1526) Pulse Rate: 98 (06/19 1526)  Labs:  Recent Labs  06/26/15 1806 06/26/15 2029 06/28/15 2318  HGB 8.1*  --   --   HCT 26.5*  --   --   PLT 270  --   --   APTT  --  31  --   LABPROT  --  14.6  --   INR  --  1.12  --   HEPARINUNFRC  --   --  0.21*  CREATININE 1.26*  --   --     Estimated Creatinine Clearance: 60.2 mL/min (by C-G formula based on Cr of 1.26).   Assessment: 75 yo male with afib will be started on heparin therapy.  Patient had recent ICH/SAH while on Eliquis; last dose of eliquis was few weeks ago. Per neuro note "Due to near resolution of ICH as well as has been 2 weeks, and high risk for recurrent embolic stroke off anticoagulation and relative small size of infarct, we recommend to start heparin drip for stroke prevention. Recommend no heparin bolus and low intensity heparin levels (0.3-0.5)". Heparin level subtherapeutic on 1300 units/hr. Hgb 8.1 (low but stable), plt ok. No issues with line or bleeding reported per RN.  Goal of Therapy:   Heparin level 0.3-0.5 units/ml Monitor platelets by anticoagulation protocol: Yes   Plan:  Increase heparin to 1500 units/hr F/u 8 hr heparin level  Sherlon Handing, PharmD, BCPS Clinical pharmacist, pager [phone] 06/29/2015,12:26 AM

## 2015-06-29 NOTE — Progress Notes (Signed)
Nutrition Follow-up  DOCUMENTATION CODES:   Non-severe (moderate) malnutrition in context of chronic illness  INTERVENTION:  Continue Boost Plus po TID (thickened to nectar thick liquids), each supplement provides 360 kcal and 14 grams of protein.   Continue Ensure Enlive po TID PRN (thickened to nectar thick liquids), each supplement provides 350 kcal and 20 grams of protein.  Encourage adequate PO intake.   NUTRITION DIAGNOSIS:   Malnutrition related to chronic illness as evidenced by moderate depletions of muscle mass, moderate depletion of body fat, percent weight loss; ongoing  GOAL:   Patient will meet greater than or equal to 90% of their needs; progressing  MONITOR:   PO intake, Supplement acceptance, Weight trends, Labs, I & O's  REASON FOR ASSESSMENT:   Malnutrition Screening Tool    ASSESSMENT:   75 y.o. right handed male with history of diabetes mellitus, coronary artery disease with CABG, atrial fibrillation on Eliquis and recent diagnosis of left lung squamous cell cancer April 2016 with subsequent chemotherapy radiation but developed abd lymph node enlargement treated with immunosuppressant therapy April 2017  Presented with 06/13/2015 to [facility] with altered mental status and right-sided weakness. CT of the head revealed left frontal intracerebral hemorrhage with subarachnoid hemorrhage. 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  MBS done today. Pt with moderate oropharyngeal dysphagia. Diet has been downgraded to a dysphagia 1 diet with nectar thick liquids. Pt with full supervision at meals. Will continue with Boost/Ensure to aid in caloric and protein needs. RD to continue to monitor.   Labs and medications reviewed.   Diet Order:  DIET - DYS 1 Room service appropriate?: Yes; Fluid consistency:: Nectar Thick  Skin:  Reviewed, no issues  Last BM:  6/17  Height:   Ht Readings from Last 1 Encounters:  06/13/15 6' (1.829 m)    Weight:   Wt  Readings from Last 1 Encounters:  06/27/15 206 lb 8 oz (93.668 kg)    Ideal Body Weight:  80.9 kg  BMI:  Body mass index is 28 kg/(m^2).  Estimated Nutritional Needs:   Kcal:  2150-2350  Protein:  110-120 grams  Fluid:  2.1 - 2.3 L/day  EDUCATION NEEDS:   No education needs identified at this time  Corrin Parker, MS, RD, LDN Pager # [phone] After hours/ weekend pager # [phone]

## 2015-06-29 NOTE — Progress Notes (Signed)
STROKE TEAM PROGRESS NOTE   SUBJECTIVE (INTERVAL HISTORY) Wife at bedside. Pt awake alert and more interactive. Still has right hemiplegia. But passed swallow. On heparin drip without neuro changes.    OBJECTIVE Temp:  [98.1 F (36.7 C)-98.7 F (37.1 C)] 98.7 F (37.1 C) (06/20 1609) Pulse Rate:  [92-93] 93 (06/20 1609) Cardiac Rhythm:  [-]  Resp:  [18] 18 (06/20 1609) BP: (127-130)/(53-59) 130/59 mmHg (06/20 1609) SpO2:  [97 %-99 %] 97 % (06/20 1609)  CBC:   Recent Labs Lab 06/26/15 1806 06/29/15 0530  WBC 7.0 5.3  HGB 8.1* 8.1*  HCT 26.5* 25.7*  MCV 86.3 86.2  PLT 270 751    Basic Metabolic Panel:   Recent Labs Lab 06/26/15 1806 06/29/15 0530  NA 136 135  K 4.2 3.8  CL 104 104  CO2 22 22  GLUCOSE 78 173*  BUN 17 <5*  CREATININE 1.26* 1.09  CALCIUM 9.4 9.1    Lipid Panel:     Component Value Date/Time   CHOL 206* 06/14/2015 0215   TRIG 64 06/15/2015 0550   HDL 50 06/14/2015 0215   CHOLHDL 4.1 06/14/2015 0215   VLDL 11 06/14/2015 0215   LDLCALC 145* 06/14/2015 0215   HgbA1c:  Lab Results  Component Value Date   HGBA1C 6.8* 06/14/2015   Urine Drug Screen: No results found for: LABOPIA, COCAINSCRNUR, LABBENZ, AMPHETMU, THCU, LABBARB    IMAGING I have personally reviewed the radiological images below and agree with the radiology interpretations.  Ct Head Wo Contrast 06/13/2015   Left posterior frontal intra cerebral hemorrhage with subarachnoid extension as described above.   Dg Chest Port 1 View 06/15/2015   Slightly better aeration. Endotracheal tube tip 3.8 cm above the carina.  06/14/2015  Stable support apparatus. Persistent opacity left hilum and left perihilar region. Postradiation fibrosis in left upper lobe again noted. Left basilar atelectasis. No pulmonary edema. No pneumothorax. Status post median sternotomy.  06/13/2015  Endotracheal tube above the carina. Stable appearing postsurgical changes and persistent opacity in the left mid lung  field.  06/13/2015   Stable to slightly increased left lung opacity which may represent posttreatment changes. No other significant change.  06/16/15 - Interval removal of the endotracheal and enteric tube. Stable appearing density in the left mid lung field with overall improvement of the aeration of the left lung.  2D Echocardiogram  - Procedure narrative: Transthoracic echocardiography. Image quality was poor. The study was technically difficult. - Left ventricle: The cavity size was normal. Systolic function was mildly reduced. The estimated ejection fraction was in the range of 45% to 50%. Wall motion was normal; there were no regional wall motion abnormalities. - Aortic valve: Valve area (Vmax): 2.82 cm^2. - Left atrium: The atrium was mildly dilated. - Right ventricle: The cavity size was mildly dilated. Wall thickness was normal. - Right atrium: The atrium was mildly dilated. - Pericardium, extracardiac: Features were not consistent with tamponade physiology. Impressions:  There was no evidence of a vegetation.  MRI brain w and w/o contrast  06/15/2015   With the rounded contour of the enhancing component of the left frontal lobe hematoma, follow-up until clearance recommended to exclude underlying vascular abnormality. Posterior left frontal lobe complex 2.5 x 2.2 x 2 cm hemorrhagic lesion with marked surrounding vasogenic edema with breakthrough into sulci with subarachnoid hemorrhage noted. Patient's history of lung cancer in addition to 6 mm rounded area of enhanced along the posterior margin of this hemorrhage suggests that this is most  likely is related to a hemorrhagic metastatic lesion which has bled. Continued MR surveillance as hemorrhage clears is recommended. There may be a a second enhancing lesion within the anterior left parietal lobe with surrounding vasogenic edema. Venous infarct can have a similar appearance. The major dural sinuses appear patent. Opacification left sphenoid sinus  with air-fluid level suggesting acute sinusitis. Bilateral mastoid air cell and middle ear opacification greater on the left without obstructing lesion of the eustachian tube noted.   MRA head 06/15/2015  MR angiogram does not incorporate the left frontal lobe hemorrhage. Mild narrowing supraclinoid aspect internal carotid artery bilaterally with irregularity more notable on the left. Small infundibulum on the left posterior communicating artery level. Anterior circulation without medium or large size vessel significant stenosis or occlusion. Middle cerebral artery branch vessel irregularity bilaterally. Right vertebral artery ends in a posterior inferior cerebellar artery distribution. No significant narrowing left vertebral artery. Moderate narrowing portions of the left posterior inferior cerebellar artery. Ectatic basilar artery without high-grade stenosis. Nonvisualized anterior inferior cerebellar arteries. Small left superior cerebellar artery. Posterior cerebral artery distal branch vessel narrowing bilaterally.   CUS Right: 1-39% ICA stenosis. Left: 1-39% ICA stenosis, however, higher velocities may be obscured by significant calcific plaque at the origin of the left ICA. Bilateral vertebral artery flow is antegrade.   LE venous doppler - negative for DVT  EEG - EEG Abnormalities: 1) mild generalized irregular slow activity Clinical Interpretation: This EEG is consistent with a mild generalized non-specific cerebral dysfunction(encephalopathy). This can be seen with sedative medications among other causes. There was no seizure or seizure predisposition recorded on this study. Please note that a normal EEG does not preclude the possibility of epilepsy.   Dg Chest 2 View  06/26/2015  IMPRESSION: 1. No acute cardiopulmonary disease. 2. No change from the prior study. Persistent focal opacity in the perihilar region of the left lung consistent with the reported squamous cell lung carcinoma.  Ct  Head Wo Contrast  06/26/2015  IMPRESSION: Mild diffuse cortical atrophy. Continued presence of left parietal intraparenchymal hemorrhage with surrounding vasogenic edema. This is improved compared to prior exam. Subarachnoid hemorrhage is no longer present.   [name] Wo Contrast  06/26/2015  IMPRESSION: 1. Acute ischemic left MCA territory infarct adjacent to the posterior left frontal hemorrhagic lesion. Vasospasm as an underlying etiology is suspected given the adjacent complex hemorrhagic lesion and subarachnoid hemorrhage. 2. Continued interval evolution of complex left frontal lobe hemorrhagic lesion with similar vasogenic edema. Associated breakthrough subarachnoid hemorrhage is improved. This lesion now demonstrates peripheral enhancement, which would be expected with an evolving hematoma. Previously noted small nodular focus of enhancement not well delineated on this exam. Again, continued MR surveillance at the hemorrhage clears is recommended to ensure no underlying lesion is present. 3. Additional 9 x 11 x 11 mm enhancing lesion within the anterior left parietal lobe, similar to prior. Attention at follow-up recommended. 4. Acute left sphenoid sinus disease with bilateral mastoid effusions, similar to prior.     PHYSICAL EXAM General - Well nourished, well developed, not in acute distress   Ophthalmologic - Fundi not visualized due to continued coughing.  Cardiovascular - Regular rate and rhythm.  Neuro - awake alert and interactive, no coughing during the encounter, follows simple central and peripheral commands, moderate dysarthria, able to repeat and name with dysarthria. Blinking to visual threat b/l, PERRL, EOMI, right facial droop with drooling, tongue in middle. Left UE 5/5 UE and 4/5 LE. RUE 0/5 and  RLE 1/5 to pain. DTR diminished, and no babinski. Sensation symmetrical, coordination intact LUE and gait not tested.   ASSESSMENT/PLAN Ivan Beasley is a 75 y.o. male  with history of stage III lung cancer after chemo and radiation therapy, recent PNA and atrial fibrillation on eliquis presenting with R sided hemiplegia. CT showed a L frontal ICH with adjacent SAH.   Stroke: left MCA infarct adjacent to left frontal ICH, likely due to afib with hold off anticoagulation due to [place], although hypercoagulable state due to advance malignancy is still in DDx.   Resultant - aphasia, worsening dysarthria   MRI - left MCA territory infarct  Due to near resolution of ICH as well as has been 2 weeks, and high risk for recurrent embolic stroke off anticoagulation and relative small size of infarct, we recommend to start heparin drip for stroke prevention. Recommend no heparin bolus and low intensity heparin levels (0.3-0.5)  Will consider to transition back to DOACs if pt stable on heparin drip in about 5 days. Wife stated that pt would not want to be put back on eliquis. If so, may consider Xarelto.  Stat CT head without contrast if any neuro changes.  Continue PT/OT/speech  ICH:  left frontal ICH with adjacent SAH while on Eliquis. MRI questionable hemorrhage secondary to lung cancer metastasis to brain in the setting of eliquis use.   Reversed with Kcentra  Resultant right hemiplegia  MRI w/ & w/o  Posterior L frontal lobe hemorrhage w/ marked surrounding vasogenic edema with SAH, concern for metastatic lesion posterior margin of hmg and anterior L parietal lobe.   Repeat MRI with and without contrast 06/26/15 can not confirm or rule out metastasis at this time.  Still need to repeat MRI with and without contrast after blood all absorbed.  MRA  Atherosclerosis, no high grade stenosis  Carotid Doppler  No significant stenosis  2D Echo from 04/2015 EF 45-50%. No source of embolus, no vegetation  LE venous doppler negative for DVT  LDL 145  HgbA1c 6.8  SCDs for VTE prophylaxis DIET - DYS 1 Room service appropriate?: Yes; Fluid consistency:: Nectar  Thick  Eliquis (apixaban) daily prior to admission, no antithrombotics after admission  Ongoing aggressive stroke risk factor management  Transient vs. Paroxsymal Atrial Fibrillation  Home anticoagulation:  Eliquis (apixaban) daily   Stopped given ICH, but due to left MCA infarct, on heparin IV as above   Found to have afib in the setting of respiratory failure  Following with Dr. 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  Oncology appointment on 7/13 for PET and 7/14 for Dr. Oliva Bustard   Possible seizure with asystole  Treated with ativan and forphenytoin  on Keppra '1000mg'$  bid  EEG no seizure  Continue keppra  Hyperlipidemia  Home meds:  No statin   LDL 145, goal < 70  On lipitor '40mg'$   Diabetes  HgbA1c 6.8, goal < 7.0  On SSI  On home DM meds  CBG under control   Other Stroke Risk Factors  Advanced age  Former Cigarette smoker, quit 2000  Overweight, Body mass index  is 28 kg/(m^2).   Coronary artery disease, s/p CABG 2000, [place]  Other Active Problems  CKD stage III  Hospital day # 8  Neurology will sign off. Please call with questions. Pt will follow up with Dr. Leonie Man at [facility] in about 2 months (will be scheduled according to the discharge summary on 07/07/2015). Thanks for the consult.   Rosalin Hawking, MD PhD Stroke Neurology 06/29/2015 5:30 PM    To contact Stroke Continuity provider, please refer to [email or web address] After hours, contact General Neurology

## 2015-06-29 NOTE — Progress Notes (Signed)
Physical Therapy Session Note  Patient Details  Name: Ivan Beasley MRN: [id number] Date of Birth: [birth year]  Today's Date: 06/29/2015 PT Individual Time: 1000-1100 and 6203-5597 PT Individual Time Calculation (min): 60 min and 30 min (total 90 min)   Short Term Goals: Week 1:  PT Short Term Goal 1 (Week 1): Pt will perform bed mobility maxA from flat bed using bedrails PT Short Term Goal 2 (Week 1): Pt will perform squat pivot transfer modA PT Short Term Goal 3 (Week 1): Pt will initiate gait training with LRAD PT Short Term Goal 4 (Week 1): Pt will perform w/c propulsion modA with L hemi technique PT Short Term Goal 5 (Week 1): Pt will demonstrate sitting balance x3 min with minA  Skilled Therapeutic Interventions/Progress Updates:    Tx 1: Pt received supine in bed, denies pain and agreeable to treatment. Supine>sit with bedrails and HOB elevated maxA. Sit <>stand from elevated bed to [facility] with +2A to clear hips and allow seat to be lowered. Transferred bed <>w/c at beginning/end of session with stedy totalA. Transfer w/c <>mat table with transfer board and maxA +2. Sitting balance with LUE dynamic reaching in anterior/L lateral direction and ball hits to facilitate anterior weight shift and engage core musculature. Pt with consistent R/posterior lean in sitting and requires variable min>max assist to return to midline; cueing for trunk extension, increased lumbar lordosis. Pt reports he needs to use the restroom. Returned to room and into bed as above, sit >supine +2A. Nurse tech alerted to pt request to be changed. Remained in bed with all needs within reach and wife present.   Tx 2: Pt received supine in bed with handoff from OT; denies pain however very fatigued from earlier sessions. Rolling R/L x3 each direction with S for rolling R, modA for rolling L with cues for hand placement, RLE in hooklying. No palpable contraction of hip extensors on R side which had been intact prior to  second CVA. L sidelying >sit with maxA initially, following 3 reps, pt able to perform with minA. Sitting balance on EOB with mod/maxA; repetitive cues for L hand in lap to reduce pushing with L extremities. Eccentric sit up x5 reps with therapist assisting; note improving activation of core musculature, however not sufficient for maintaining midline sitting balance. Returned to supine with maxA log roll technique. Remained in L sidelying at completion of session, all needs in reach and wife present.   Therapy Documentation Precautions:  Precautions Precautions: Fall Precaution Comments: R hemi, pt on home O2 PTA for recent pneumonia (but used it PRN, not 24/7) Restrictions Weight Bearing Restrictions: No Pain: Pain Assessment Pain Assessment: No/denies pain   See Function Navigator for Current Functional Status.   Therapy/Group: Individual Therapy  Luberta Mutter 06/29/2015, 3:45 PM

## 2015-06-29 NOTE — Progress Notes (Signed)
Speech Language Pathology Weekly Progress and Session Note  Patient Details  Name: Ivan Beasley MRN: [id number] Date of Birth: Apr 15, 1940  Beginning of progress report period: June 21, 2015   End of progress report period: June 29, 2015   Today's Date: 06/29/2015 SLP Individual Time: 1638-4536 SLP Individual Time Calculation (min): 26 min  Short Term Goals: Week 1: SLP Short Term Goal 1 (Week 1): Patient will recall new daily information such an meds, functions and frequency with Min verbal cues to utilize external aids SLP Short Term Goal 1 - Progress (Week 1): Not met SLP Short Term Goal 2 (Week 1): Patient will solve problems realted to self-care, taking into account new deficits with Min verbal cues   SLP Short Term Goal 2 - Progress (Week 1): Not met SLP Short Term Goal 3 (Week 1): Patient will utilize word finding strategies during structured phrase-sentence level tasks with Mod verbal cues   SLP Short Term Goal 3 - Progress (Week 1): Not met SLP Short Term Goal 4 (Week 1): Patient will susutain attention to functional tasks for 8-10 minutnes with Min verbal cue for redirection  SLP Short Term Goal 4 - Progress (Week 1): Not met    New Short Term Goals: Week 2: SLP Short Term Goal 1 (Week 2): Pt will be intelligible in phrases with mod assist verbal cues for overarticulation, increased vocal intensity,  SLP Short Term Goal 2 (Week 2): Pt will consume Dys 1 textures and nectar thick liquids with min assist verbal cues for use of swallowing precautions and minimal overt s/s of aspiration.  SLP Short Term Goal 3 (Week 2): Pt will consume therapeutic trials of thin liquids via teaspoon with min assist verbal cues for use of swallowing precautions and minimal overt s/s of aspiration.  SLP Short Term Goal 4 (Week 2): Pt will return demonstration of pharyngeal strengthening exercises for 25 repetitions each with min assist verbal cues.   SLP Short Term Goal 5 (Week 2): Pt will sustain  his attention to basic tasks for 2-3 minute intervals with min verbal cues for redirection.    Weekly Progress Updates:  Pt with decline in function this reporting period due to new L MCA CVA and has met 0 out of 4 short term goals.  Pt now presents with a moderate oropharyngeal dysphagia and has been downgraded to dys 1 textures and nectar thick liquids with full supervision for use of swallowing precautions.  Pt also with significantly decreased speech intelligibility due to right sided oral motor weakness.  Pt's cognition appears worse since new CVA and now presents with moderate deficits characterized by impulsivity and decreased sustained attention to tasks.  Pt would continue to benefit from skilled ST while inpatient in order to maximize functional independence and reduce burden of care prior to discharge.  Pt and family education is ongoing.     Intensity: Minumum of 1-2 x/day, 30 to 90 minutes Frequency: 3 to 5 out of 7 days Duration/Length of Stay: 24-27 days  Treatment/Interventions: Cognitive remediation/compensation;Cueing hierarchy;Environmental controls;Functional tasks;Internal/external aids;Medication managment;Multimodal communication approach;Patient/family education;Speech/Language facilitation;Therapeutic Exercise   Daily Session  Skilled Therapeutic Interventions: Pt was seen for skilled ST targeting goals for dysphagia and family education.  [name] was at bedside and was educated regarding results of MBS and rationale behind currently prescribed diet and swallowing precautions.  [name] returned demonstration of cuing techniques to maximize pt's swallowing safety during lunch meal with supervision instructional cues to cue pt for extra swallows and  intermittent throat clear.  Pt with intermittently wet vocal quality and congested coughing with increased rate of self feeding which were mitigated with mod assist verbal and tactile cues for rate and portion control.  All  questions were answered to pt's and wife's satisfaction at this time.  Pt left in bed with wife at bedside.  Goals updated on this date to reflect current progress and plan of care.       Function:   Eating Eating   Modified Consistency Diet: Yes Eating Assist Level: Help with picking up utensils;Help managing cup/glass           Cognition Comprehension Comprehension assist level: Understands basic 75 - 89% of the time/ requires cueing 10 - 24% of the time  Expression   Expression assist level: Expresses basic 50 - 74% of the time/requires cueing 25 - 49% of the time. Needs to repeat parts of sentences.  Social Interaction Social Interaction assist level: Interacts appropriately 50 - 74% of the time - May be physically or verbally inappropriate.  Problem Solving Problem solving assist level: Solves basic 50 - 74% of the time/requires cueing 25 - 49% of the time  Memory Memory assist level: Recognizes or recalls 25 - 49% of the time/requires cueing 50 - 75% of the time   General    Pain Pain Assessment Pain Assessment: No/denies pain  Therapy/Group: Individual Therapy  Norvell Caswell, [name] 06/29/2015, 3:58 PM

## 2015-06-29 NOTE — Progress Notes (Signed)
ANTICOAGULATION CONSULT NOTE - Follow Up Consult  Pharmacy Consult for heparin Indication: atrial fibrillation  No Known Allergies  Patient Measurements: Weight: 206 lb 8 oz (93.668 kg) Heparin Dosing Weight: 93.7 kg  Vital Signs: Temp: 98.1 F (36.7 C) (06/20 0556) Temp Source: Axillary (06/20 0556) BP: 127/53 mmHg (06/20 0556) Pulse Rate: 92 (06/20 0556)  Labs:  Recent Labs  06/26/15 1806 06/26/15 2029 06/28/15 2318 06/29/15 0530  HGB 8.1*  --   --  8.1*  HCT 26.5*  --   --  25.7*  PLT 270  --   --  243  APTT  --  31  --   --   LABPROT  --  14.6  --   --   INR  --  1.12  --   --   HEPARINUNFRC  --   --  0.21*  --   CREATININE 1.26*  --   --  1.09    Estimated Creatinine Clearance: 69.6 mL/min (by C-G formula based on Cr of 1.09).   Medications:  Scheduled:  . amiodarone  200 mg Oral Daily  . antiseptic oral rinse  7 mL Mouth Rinse BID  . atorvastatin  40 mg Oral q1800  . cefTRIAXone (ROCEPHIN)  IV  1 g Intravenous Q24H  . diltiazem  60 mg Oral BID  . glimepiride  1 mg Oral QAC breakfast  . insulin aspart  0-24 Units Subcutaneous TID AC & HS  . lactose free nutrition  237 mL Oral TID WC  . levETIRAcetam  1,000 mg Intravenous Q12H  . metFORMIN  1,000 mg Oral Q breakfast  . metFORMIN  500 mg Oral Q supper  . pantoprazole  40 mg Oral Daily   Infusions:  . dextrose 5 % and 0.45 % NaCl with KCl 20 mEq/L 125 mL/hr at 06/29/15 0112  . heparin 1,500 Units/hr (06/29/15 0042)    Assessment: 75 yo male with afib is currently on supratherapeutic heparin.  Heparin level is 0.58.  Goal of Therapy:  Heparin level 0.3-0.5 units/ml Monitor platelets by anticoagulation protocol: Yes   Plan:  - Reduce heparin to 1400 units/hr - 8 hr heparin level - daily heparin level and CBC  Equan Cogbill, Tsz-Yin 06/29/2015,8:03 AM

## 2015-06-29 NOTE — Progress Notes (Signed)
ANTICOAGULATION CONSULT NOTE - Follow Up Consult  Pharmacy Consult for heparin Indication: atrial fibrillation  No Known Allergies  Patient Measurements: Weight: 206 lb 8 oz (93.668 kg) Heparin Dosing Weight: 93.7 kg  Vital Signs: Temp: 98.7 F (37.1 C) (06/20 1609) Temp Source: Oral (06/20 1609) BP: 130/59 mmHg (06/20 1609) Pulse Rate: 93 (06/20 1609)  Labs:  Recent Labs  06/26/15 2029 06/28/15 2318 06/29/15 0530 06/29/15 0815 06/29/15 1825  HGB  --   --  8.1*  --   --   HCT  --   --  25.7*  --   --   PLT  --   --  243  --   --   APTT 31  --   --   --   --   LABPROT 14.6  --   --   --   --   INR 1.12  --   --   --   --   HEPARINUNFRC  --  0.21*  --  0.58 0.51  CREATININE  --   --  1.09  --   --     Estimated Creatinine Clearance: 69.6 mL/min (by C-G formula based on Cr of 1.09).  Assessment: 75 yo male with afib on heparin.  Heparin level just above goal at 0.51 units/mL this evening. Hgb 8.1, plts 243- no bleeding noted.  Goal of Therapy:  Heparin level 0.3-0.5 units/ml Monitor platelets by anticoagulation protocol: Yes   Plan:  - Reduce heparin to 1350 units/hr - Next HL with AM labs - Daily heparin level and CBC  Jaclyn Andy D. Shallyn Constancio, PharmD, BCPS Clinical Pharmacist Pager: [phone] 06/29/2015 7:29 PM

## 2015-06-29 NOTE — Progress Notes (Signed)
Occupational Therapy Session Note  Patient Details  Name: Ivan Beasley MRN: [id number] Date of Birth: [birth year]  Today's Date: 06/29/2015 OT Individual Time: 1400-1500 OT Individual Time Calculation (min): 60 min    Short Term Goals: Week 1:  OT Short Term Goal 1 (Week 1): Pt will demonstrate increased awareness to RUE by positioning appropriately to decrease shoulder subluxation OT Short Term Goal 2 (Week 1): Pt will completed squat pivot transfer to toilet with max assist of one caregiver OT Short Term Goal 3 (Week 1): Pt will complete UB dressing with mod assist OT Short Term Goal 4 (Week 1): Pt will complete LB dressing with max assist of one caregiver at sit > stand level OT Short Term Goal 5 (Week 1): Pt will complete bathing with mod assist at sit > stand level  Skilled Therapeutic Interventions/Progress Updates:    Treatment session with focus on static sitting balance and transfers.  Pt in bed upon arrival being cleaned from incontinent BM by nurse tech.  Completed bed mobility with max assist and max cues for hand placement to assist with rolling into sidelying prior to coming up to sitting.  Completed scoot/squat pivot transfer to w/c with total +2 due to pushing with LUE and decreased anterior weight shift.  Pt requires increased verbal and tactile cues for weight shifting and to follow directions.  Upon arrival into therapy gym, pt reports having had a BM.  Returned to room and utilized SARA plus for sit > stand and transfer back to bed to complete hygiene.  Cues for upright standing posture and therapist on Rt to assist with weight shift in SARA and maintain positioning of RUE.  Completed hygiene in sidelying with pt incontinent of BM x2.  Therapy Documentation Precautions:  Precautions Precautions: Fall Precaution Comments: R hemi, pt on home O2 PTA for recent pneumonia (but used it PRN, not 24/7) Restrictions Weight Bearing Restrictions: No Pain: Pain Assessment Pain  Assessment: No/denies pain  See Function Navigator for Current Functional Status.   Therapy/Group: Individual Therapy  [name] 06/29/2015, 3:34 PM

## 2015-06-30 ENCOUNTER — Inpatient Hospital Stay (HOSPITAL_COMMUNITY): Payer: Medicare HMO | Admitting: Speech Pathology

## 2015-06-30 ENCOUNTER — Inpatient Hospital Stay (HOSPITAL_COMMUNITY): Payer: Medicare HMO | Admitting: Physical Therapy

## 2015-06-30 ENCOUNTER — Inpatient Hospital Stay (HOSPITAL_COMMUNITY): Payer: Medicare HMO | Admitting: Occupational Therapy

## 2015-06-30 DIAGNOSIS — Z7901 Long term (current) use of anticoagulants: Secondary | ICD-10-CM

## 2015-06-30 DIAGNOSIS — C3412 Malignant neoplasm of upper lobe, left bronchus or lung: Secondary | ICD-10-CM

## 2015-06-30 LAB — GLUCOSE, CAPILLARY
GLUCOSE-CAPILLARY: 157 mg/dL — AB (ref 65–99)
GLUCOSE-CAPILLARY: 158 mg/dL — AB (ref 65–99)
GLUCOSE-CAPILLARY: 113 mg/dL — AB (ref 65–99)
Glucose-Capillary: 141 mg/dL — ABNORMAL HIGH (ref 65–99)
Glucose-Capillary: 147 mg/dL — ABNORMAL HIGH (ref 65–99)
Glucose-Capillary: 169 mg/dL — ABNORMAL HIGH (ref 65–99)
Glucose-Capillary: 151 mg/dL — ABNORMAL HIGH (ref 65–99)

## 2015-06-30 LAB — CBC
HCT: 25.9 % — ABNORMAL LOW (ref 39.0–52.0)
HEMOGLOBIN: 8 g/dL — AB (ref 13.0–17.0)
MCH: 26.6 pg (ref 26.0–34.0)
MCHC: 30.9 g/dL (ref 30.0–36.0)
MCV: 86 fL (ref 78.0–100.0)
Platelets: 231 10*3/uL (ref 150–400)
RBC: 3.01 MIL/uL — ABNORMAL LOW (ref 4.22–5.81)
RDW: 17.9 % — AB (ref 11.5–15.5)
WBC: 4.8 10*3/uL (ref 4.0–10.5)

## 2015-06-30 LAB — BASIC METABOLIC PANEL
Anion gap: 9 (ref 5–15)
CALCIUM: 8.8 mg/dL — AB (ref 8.9–10.3)
CHLORIDE: 105 mmol/L (ref 101–111)
CO2: 20 mmol/L — ABNORMAL LOW (ref 22–32)
CREATININE: 1.2 mg/dL (ref 0.61–1.24)
GFR calc non Af Amer: 57 mL/min — ABNORMAL LOW (ref >60)
Glucose, Bld: 157 mg/dL — ABNORMAL HIGH (ref 65–99)
Potassium: 4.4 mmol/L (ref 3.5–5.1)
SODIUM: 134 mmol/L — AB (ref 135–145)

## 2015-06-30 LAB — HEPARIN LEVEL (UNFRACTIONATED): HEPARIN UNFRACTIONATED: 0.4 [IU]/mL (ref 0.30–0.70)

## 2015-06-30 MED ORDER — MIRTAZAPINE 15 MG PO TABS
7.5000 mg | ORAL_TABLET | Freq: Every day | ORAL | Status: DC
  Administered 2015-06-30 – 2015-07-02 (×3): 7.5 mg via ORAL
  Filled 2015-06-30 (×3): qty 1

## 2015-06-30 MED ORDER — RESOURCE THICKENUP CLEAR PO POWD
ORAL | Status: DC | PRN
  Filled 2015-06-30: qty 125

## 2015-06-30 NOTE — Progress Notes (Signed)
Speech Language Pathology Daily Session Note  Patient Details  Name: Ivan Beasley MRN: [id number] Date of Birth: 09/13/1940  Today's Date: 06/30/2015 SLP Individual Time: 1135-1205 SLP Individual Time Calculation (min): 30 min  Short Term Goals: Week 2: SLP Short Term Goal 1 (Week 2): Pt will be intelligible in phrases with mod assist verbal cues for overarticulation, increased vocal intensity,  SLP Short Term Goal 2 (Week 2): Pt will consume Dys 1 textures and nectar thick liquids with min assist verbal cues for use of swallowing precautions and minimal overt s/s of aspiration.  SLP Short Term Goal 3 (Week 2): Pt will consume therapeutic trials of thin liquids via teaspoon with min assist verbal cues for use of swallowing precautions and minimal overt s/s of aspiration.  SLP Short Term Goal 4 (Week 2): Pt will return demonstration of pharyngeal strengthening exercises for 25 repetitions each with min assist verbal cues.   SLP Short Term Goal 5 (Week 2): Pt will sustain his attention to basic tasks for 2-3 minute intervals with min verbal cues for redirection.    Skilled Therapeutic Interventions:  Pt was seen for skilled ST targeting goals for dysphagia and cognition.  Pt consumed dys 1 textures and nectar thick liquids with mod assist verbal and tactile cues for rate and portion control and extra swallows.  Pt with congested cough before PO intake which did not appear to increased with POs.  Wife also reports adequate toleration of diet at breakfast meal today.  Pt remains at risk of penetration/aspiration due to respiratory status and deconditioning in addition to oropharyngeal deficits mentioned on MBS.  Pt able to communicate needs and wants to therapist at the phrase level with mod assist verbal cues for pacing, overarticulation to achieve intelligibility and mod assist semantic cues to facilitate word finding.  Pt was left in bed with wife at bedside providing appropriate full supervision  during meal.  Continue per current plan of care.    Function:  Eating Eating Eating activity did not occur: N/A Modified Consistency Diet: Yes Eating Assist Level: Help with picking up utensils;Help managing cup/glass;Set up assist for   Eating Set Up Assist For: Opening containers       Cognition Comprehension Comprehension assist level: Understands basic 75 - 89% of the time/ requires cueing 10 - 24% of the time  Expression   Expression assist level: Expresses basic 50 - 74% of the time/requires cueing 25 - 49% of the time. Needs to repeat parts of sentences.  Social Interaction Social Interaction assist level: Interacts appropriately 50 - 74% of the time - May be physically or verbally inappropriate.  Problem Solving Problem solving assist level: Solves basic 50 - 74% of the time/requires cueing 25 - 49% of the time  Memory Memory assist level: Recognizes or recalls 50 - 74% of the time/requires cueing 25 - 49% of the time    Pain Pain Assessment Pain Assessment: Faces Faces Pain Scale: Hurts little more Pain Type: Acute pain Pain Location: Neck Pain Orientation: Right Pain Descriptors / Indicators: Aching Pain Intervention(s): RN made aware;Heat applied  Therapy/Group: Individual Therapy  Kami Kube, [name] 06/30/2015, 12:24 PM

## 2015-06-30 NOTE — Plan of Care (Signed)
Problem: RH BLADDER ELIMINATION Goal: RH STG MANAGE BLADDER WITH ASSISTANCE STG Manage Bladder With mod Assistance  Outcome: Not Progressing Patient foley removed 06/29/15 1800; requiring I&O cath at this time as pt unable to void on own.

## 2015-06-30 NOTE — Progress Notes (Signed)
Occupational Therapy Weekly Progress Note  Patient Details  Name: Ivan Beasley MRN: [id number] Date of Birth: 1940/03/06  Beginning of progress report period: June 22, 2015 End of progress report period: June 30, 2015  Today's Date: 06/30/2015 OT Individual Time: 3536-1443 OT Individual Time Calculation (min): 58 min    Patient has met 0 of 5 short term goals.  Pt had begun making some progress towards goals with ability to complete squat pivot and slide board tranfers with mod-max assist of one caregiver and was beginning to develop trace movements in Rt bicep/tricep and loose gross grasp.  However pt had new CVA over the weekend which has set pt back.  Pt currently requires +2 assist with all ADLs (bathing and dressing) at bed level and transfers due to increase in pushing tendency and decreased trunk control.  RUE had returned to flaccid with impaired sensation and proprioception.  Pt is demonstrating increased lability throughout sessions.  Patient continues to demonstrate the following deficits: impaired sitting balance, trunk control, flaccid RUE, impaired sensation and proprioception, lability, cognitive deficits and therefore will continue to benefit from skilled OT intervention to enhance overall performance with BADL and Reduce care partner burden.  Patient is not progressing towards goals due to new CVA impacting pt sitting balance and increased burden of care.  Continue plan of care.  OT Short Term Goals Week 1:  OT Short Term Goal 1 (Week 1): Pt will demonstrate increased awareness to RUE by positioning appropriately to decrease shoulder subluxation OT Short Term Goal 1 - Progress (Week 1): Not met OT Short Term Goal 2 (Week 1): Pt will completed squat pivot transfer to toilet with max assist of one caregiver OT Short Term Goal 2 - Progress (Week 1): Not met OT Short Term Goal 3 (Week 1): Pt will complete UB dressing with mod assist OT Short Term Goal 3 - Progress (Week 1):  Not met OT Short Term Goal 4 (Week 1): Pt will complete LB dressing with max assist of one caregiver at sit > stand level OT Short Term Goal 4 - Progress (Week 1): Not met OT Short Term Goal 5 (Week 1): Pt will complete bathing with mod assist at sit > stand level OT Short Term Goal 5 - Progress (Week 1): Not met Week 2:  OT Short Term Goal 1 (Week 2): Pt will demonstrate increased awareness to RUE by positioning appropriately to decrease shoulder subluxation OT Short Term Goal 2 (Week 2): Pt will complete squat pivot transfer to toilet with max assist of one caregiver OT Short Term Goal 3 (Week 2): Pt will complete UB dressing with mod assist in unsupported sitting OT Short Term Goal 4 (Week 2): Pt will complete LB dressing with max assist of one caregiver at bed level OT Short Term Goal 5 (Week 2): Pt will complete bathing with mod assist at bed level  Skilled Therapeutic Interventions/Progress Updates:    Treatment session with focus on self-care retraining with bed mobility, sitting balance, positioning of RUE, and transfers.  Pt in bed reporting he did not sleep well last night.  Pt very tearful throughout session, therapist provided therapeutic touch and consoling throughout as well as encouragement.  Completed perineal hygiene at bed level with +2 to assist when rolling to Lt to assist with positioning of RUE and RLE.  Donned shorts at bed level with pt assisting with bridging Lt hip and attempting to pull pants over hip, requiring assist to roll to fully adjust shorts on  Rt side.  Sidelying to sitting with mod assist and increased carryover of positioning and technique from previous sessions.  Engaged in [place] bathing and dressing seated at EOB with pt requiring min-mod assist for sitting balance due to tendency to lose balance posteriorly and pushing tendencies with LUE.  Lateral scoot/squat pivot to Rt with pt demonstrating improved anterior weight shift with placing hand on therapist's waist to  further promote weight shift.  +2 for scoot/squat pivot for safety and to further facilitate weight shift and lift off of buttocks.  Educated on positioning in w/c as pt tends to push to Rt with LUE and demonstrates shortening of Rt trunk.  Left seated in reclining w/c with wife to assist with breakfast.  Therapy Documentation Precautions:  Precautions Precautions: Fall Precaution Comments: R hemi, pt on home O2 PTA for recent pneumonia (but used it PRN, not 24/7) Restrictions Weight Bearing Restrictions: No Pain:   Pt with no c/o pain  See Function Navigator for Current Functional Status.   Therapy/Group: Individual Therapy  Danniela Mcbrearty, [place] 06/30/2015, 10:00 AM

## 2015-06-30 NOTE — Progress Notes (Signed)
ANTICOAGULATION CONSULT NOTE - Follow Up Consult  Pharmacy Consult for heparin Indication: atrial fibrillation  No Known Allergies  Patient Measurements: Weight: 206 lb 8 oz (93.668 kg) Heparin Dosing Weight: 93.7 kg  Vital Signs: Temp: 97.6 F (36.4 C) (06/21 0408) Temp Source: Oral (06/21 0408) BP: 115/56 mmHg (06/21 0408) Pulse Rate: 88 (06/21 0408)  Labs:  Recent Labs  06/29/15 0530 06/29/15 0815 06/29/15 1825 06/30/15 0706  HGB 8.1*  --   --  8.0*  HCT 25.7*  --   --  25.9*  PLT 243  --   --  231  HEPARINUNFRC  --  0.58 0.51 0.40  CREATININE 1.09  --   --   --     Estimated Creatinine Clearance: 69.6 mL/min (by C-G formula based on Cr of 1.09).   Medications:  Scheduled:  . amiodarone  200 mg Oral Daily  . antiseptic oral rinse  7 mL Mouth Rinse BID  . atorvastatin  40 mg Oral q1800  . cefTRIAXone (ROCEPHIN)  IV  1 g Intravenous Q24H  . diltiazem  60 mg Oral BID  . glimepiride  1 mg Oral QAC breakfast  . insulin aspart  0-24 Units Subcutaneous TID AC & HS  . lactose free nutrition  237 mL Oral TID WC  . levETIRAcetam  1,000 mg Intravenous Q12H  . metFORMIN  1,000 mg Oral Q breakfast  . metFORMIN  500 mg Oral Q supper  . pantoprazole  40 mg Oral Daily   Infusions:  . dextrose 5 % and 0.45 % NaCl with KCl 20 mEq/L 125 mL/hr at 06/30/15 0757  . heparin 1,350 Units/hr (06/30/15 0748)    Assessment: 75 yo male with afib with hx of recent ICH/SAH is currently on therapeutic heparin.  Heparin level is 0.4.  Goal of Therapy:  Heparin level 0.3-0.5 units/ml Monitor platelets by anticoagulation protocol: Yes   Plan:  - Continue heparin at 1350 units/hr - daily heparin level and CBC - watch for signs of bleeding  Vito Beg, Tsz-Yin 06/30/2015,8:39 AM

## 2015-06-30 NOTE — Progress Notes (Signed)
Physical Therapy Weekly Progress Note  Patient Details  Name: Ivan Beasley MRN: [id number] Date of Birth: 05/12/1940  Beginning of progress report period: June 22, 2015 End of progress report period: June 30, 2015  Today's Date: 06/30/2015 PT Individual Time: 1000-1100 and 1505-1530  PT Individual Time Calculation (min): 60 min and 25 min (total 85 min)  Patient has met 1 of 5 short term goals.  Pt has met bed mobility goal, performing with variable min>modA. Pt had reduced assist during transfers to maxA +1, and had initiated gait training prior to second CVA which has resulted in functional decline. Pt with poor sitting balance, min guard initially however posterior LOBs and poor recovery strategies. Gait training not initiated at this time due to poor balance and absent AROM RLE.   Patient continues to demonstrate the following deficits:  activity tolerance, balance, postural control, ability to compensate for deficits, functional use of  right upper extremity and right lower extremity, attention, awareness and coordination and therefore will continue to benefit from skilled PT intervention to enhance overall performance with bed mobility, transfers, gait, home and community access.   Patient progressing slowly towards LTGs following second CVA while in rehab. .  Continue plan of care.  PT Short Term Goals Week 1:  PT Short Term Goal 1 (Week 1): Pt will perform bed mobility maxA from flat bed using bedrails PT Short Term Goal 1 - Progress (Week 1): Met PT Short Term Goal 2 (Week 1): Pt will perform squat pivot transfer modA PT Short Term Goal 2 - Progress (Week 1): Not met PT Short Term Goal 3 (Week 1): Pt will initiate gait training with LRAD PT Short Term Goal 3 - Progress (Week 1): Not met PT Short Term Goal 4 (Week 1): Pt will perform w/c propulsion modA with L hemi technique PT Short Term Goal 4 - Progress (Week 1): Not met PT Short Term Goal 5 (Week 1): Pt will demonstrate  sitting balance x3 min with minA PT Short Term Goal 5 - Progress (Week 1): Not met Week 2:  PT Short Term Goal 1 (Week 2): Pt will perform supine<>sit with modA PT Short Term Goal 2 (Week 2): Pt will perform transfer w/c <>bed with maxA +1 PT Short Term Goal 3 (Week 2): Pt will perform sit <>stand maxA PT Short Term Goal 4 (Week 2): Pt will demonstrate sitting balance x3 min with minA PT Short Term Goal 5 (Week 2): Pt will initiate gait training  Skilled Therapeutic Interventions/Progress Updates:    Tx 1: Pt received seated in w/c, denies pain and agreeable to treatment. Demonstrated to wife how to recliner w/c for improved head support, pressure relief. Transfer w/c <>mat table/bed x3 during session with transfer board and maxA +2. Seated balance with dynamic LUE reaching while RUE supported on bench for weight bearing and tactile input. Performed matching task with bean bags after retrieving from rehab tech, held in L anterolateral direction to reduce R/posterior lean. Pt reports he needs to use the restroom; returned to room totalA and transferred into bed as above. Rolling R with S, L with maxA and bedrails to perform brief change and hygiene totalA. Pt very emotional towards end of session, crying and difficult to console. Offered emotional support and pt remained supine in bed to rest at end of session, all needs within reach.   Tx 2: Pt received supine in bed, denies pain and agreeable to treatment. Rolling R/L with minA and bridging with modA to  remove pants, and perform hygiene and changing brief after incontinent bowel movement. Rolling L to prepare for sidelying>sit with min guard and bedrails. Supine<>sit x4 reps with modA faded to minA with improving pt initiation with LUE for pushing up, and understanding of LLE hooking RLE to move BLEs off EOB. Seated balance min guard overall, one posterior LOB requiring maxA to recover. Returned to supine with modA. Scooting toward HOB with LUE on  bedrail and LEs in hooklying to assist, +2 A overall. Remained supine in bed at completion of session, all needs within reach. NT alerted to pt report that he needed his brief changed.   Therapy Documentation Precautions:  Precautions Precautions: Fall Precaution Comments: R hemi, pt on home O2 PTA for recent pneumonia (but used it PRN, not 24/7) Restrictions Weight Bearing Restrictions: No Pain: Pain Assessment Pain Assessment: No/denies pain Pain Score: 0-No pain   See Function Navigator for Current Functional Status.  Therapy/Group: Individual Therapy  Luberta Mutter 06/30/2015, 3:56 PM

## 2015-06-30 NOTE — Progress Notes (Signed)
Subjective/Complaints: Patient remains emotionally labile, poor sleep at night. Past swallow evaluation taking by mouth with dysphagia diet Review of systems aphasic limiting review Objective: Vital Signs: Blood pressure 115/56, pulse 88, temperature 97.6 F (36.4 C), temperature source Oral, resp. rate 18, weight 93.668 kg (206 lb 8 oz), SpO2 99 %. Dg Swallowing Func-speech Pathology  06/29/2015  Objective Swallowing Evaluation: Type of Study: MBS-Modified Barium Swallow Study Patient Details Name: Ivan Beasley MRN: [id number] Date of Birth: 03/15/1940 Today's Date: 06/29/2015 Time: SLP Start Time (ACUTE ONLY): 0910-SLP Stop Time (ACUTE ONLY): 0930 SLP Time Calculation (min) (ACUTE ONLY): 20 min Past Medical History: Past Medical History Diagnosis Date . Coronary artery disease  . Pneumonia 2000 . Arthritis  . 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 Bruises easily  . GERD (gastroesophageal reflux disease)    takes Omeprazole daily as needed for stomach pain . Blood transfusion 2001 . Diabetes mellitus    takes Metformin daily; . Impaired hearing    bil hearing aide . Macular degeneration    being watched for this but hasn't been "completely" diagnosed . Myocardial infarction ([place]) 2001 . Cancer [facility])  Past Surgical History: Past Surgical History Procedure Laterality Date . Coronary artery bypass graft  2001   4 vessels . Cardiac catheterization  2001 . Tonsillectomy     as a child . Colonoscopy   . Cataract extraction     bilateral . Lumbar laminectomy/decompression microdiscectomy  12/19/2010   Procedure: LUMBAR LAMINECTOMY/DECOMPRESSION MICRODISCECTOMY;  Surgeon: Elaina Hoops;  Location: [facility];  Service: Neurosurgery;  Laterality: N/A;  Lumbar three-four, four-five decompressive lumbar laminectomy . Joint replacement  right tkr . Eye surgery     cataracts . Back surgery     l4 5 . Portacath placement N/A 05/20/2014   Procedure: INSERTION PORT-A-CATH;  Surgeon: Nestor Lewandowsky, MD;  Location:  [facility] ORS;  Service: General;  Laterality: N/A; HPI: 75 y.o. male patient with PMH: GERD, pna, CAD, DM, MI, cancer and chronic admitted with right-sided hemi-plegia. CT of the head revealed left frontal intracerebral hemorrhage, with subarachnoid hemorrhage as well adjacent to it. Intubated approximately 24-30 hours (extubated 6/6). 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  Objective swallow study ordered to determine safest diet consistencies due to change in function.  No Data Recorded Assessment / Plan / Recommendation CHL IP CLINICAL IMPRESSIONS 06/29/2015 Therapy Diagnosis Moderate oral dysphagia, Moderate pharyngeal dysphagia  Clinical Impression Pt presents with a moderate oropharyngeal dysphagia with both sensory and motor components.  As mentioned on bedside swallow evaluation, pt presents with right sided labial, lingual, and buccal weakness which impacts his ability to contain and transit boluses effectively.  This in combination with decreased pharyngeal sensation result in premature spillage of materials into the pharynx with swallow response most consistently triggered at the pyriforms.  Delay in swallow response resulted in aspiration of thin liquids with delayed sensation.  Congested cough which occurred several seconds after aspiration event was ineffective for clearing aspirates from the airway.  Deep penetration was noted x1 over multiple boluses of nectar thick liquids which pt was able to sense and clear from the airway before materials passed below the vocal cords.  Poor base of tongue strength and decreased hyolaryngeal excursion also contributed to moderate pharyngeal residuals post swallow (vallecular residue > pyriforms).  Residue cleared to minimal-mild with cues for extra swallows. Recommend that pt initiate a Dys 1  diet with nectar thick liquids and full supervision for use of swallowing precautions.   Prognosis for advancement good  with SLP intervention for pharyngeal strengthening exercises and management of safe diet progression.   Impact on safety and function Moderate aspiration risk     Prognosis 06/29/2015 Prognosis for Safe Diet Advancement Good Barriers to Reach Goals -- Barriers/Prognosis Comment -- CHL IP DIET RECOMMENDATION 06/29/2015 SLP Diet Recommendations Dysphagia 1 (Puree) solids;Nectar thick liquid Liquid Administration via Cup Medication Administration Crushed with puree Compensations Slow rate;Small sips/bites;Clear throat intermittently;Multiple dry swallows after each bite/sip Postural Changes --   No flowsheet data found.         CHL IP ORAL PHASE 06/29/2015 Oral Phase Impaired Oral - Pudding Teaspoon -- Oral - Pudding Cup -- Oral - Honey Teaspoon -- Oral - Honey Cup -- Oral - Nectar Teaspoon Weak lingual manipulation;Right anterior bolus loss;Reduced posterior propulsion;Lingual/palatal residue;Delayed oral transit;Decreased bolus cohesion;Premature spillage Oral - Nectar Cup Right anterior bolus loss;Weak lingual manipulation;Lingual/palatal residue;Delayed oral transit;Decreased bolus cohesion;Premature spillage;Reduced posterior propulsion Oral - Nectar Straw Weak lingual manipulation;Lingual/palatal residue;Decreased bolus cohesion;Premature spillage Oral - Thin Teaspoon Right anterior bolus loss;Weak lingual manipulation;Reduced posterior propulsion;Lingual/palatal residue;Premature spillage;Delayed oral transit;Decreased bolus cohesion Oral - Thin Cup Weak lingual manipulation;Premature spillage;Lingual/palatal residue;Reduced posterior propulsion;Right anterior bolus loss;Delayed oral transit;Decreased bolus cohesion Oral - Thin Straw -- Oral - Puree Right anterior bolus loss;Weak lingual manipulation;Reduced posterior propulsion;Lingual/palatal residue;Delayed oral transit;Decreased bolus cohesion;Premature spillage Oral - Mech Soft -- Oral - Regular -- Oral - Multi-Consistency -- Oral - Pill -- Oral Phase -  Comment --  CHL IP PHARYNGEAL PHASE 06/29/2015 Pharyngeal Phase Impaired Pharyngeal- Pudding Teaspoon -- Pharyngeal -- Pharyngeal- Pudding Cup -- Pharyngeal -- Pharyngeal- Honey Teaspoon -- Pharyngeal -- Pharyngeal- Honey Cup -- Pharyngeal -- Pharyngeal- Nectar Teaspoon Delayed swallow initiation-vallecula;Reduced anterior laryngeal mobility;Reduced laryngeal elevation;Reduced tongue base retraction;Reduced airway/laryngeal closure;Pharyngeal residue - posterior pharnyx;Pharyngeal residue - valleculae Pharyngeal -- Pharyngeal- Nectar Cup Delayed swallow initiation-pyriform sinuses;Reduced anterior laryngeal mobility;Reduced laryngeal elevation;Reduced airway/laryngeal closure;Reduced tongue base retraction;Pharyngeal residue - valleculae;Pharyngeal residue - posterior pharnyx;Penetration/Aspiration during swallow Pharyngeal Material enters airway, CONTACTS cords and then ejected out Pharyngeal- Nectar Straw Reduced anterior laryngeal mobility;Reduced laryngeal elevation;Reduced airway/laryngeal closure;Reduced tongue base retraction;Pharyngeal residue - valleculae;Pharyngeal residue - posterior pharnyx;Pharyngeal residue - pyriform Pharyngeal -- Pharyngeal- Thin Teaspoon Delayed swallow initiation-vallecula;Reduced anterior laryngeal mobility;Reduced laryngeal elevation;Reduced airway/laryngeal closure;Reduced tongue base retraction;Pharyngeal residue - valleculae;Pharyngeal residue - pyriform;Penetration/Aspiration during swallow Pharyngeal Material enters airway, CONTACTS cords and then ejected out Pharyngeal- Thin Cup Delayed swallow initiation-pyriform sinuses;Reduced anterior laryngeal mobility;Reduced laryngeal elevation;Reduced airway/laryngeal closure;Reduced tongue base retraction;Penetration/Aspiration during swallow;Pharyngeal residue - valleculae;Pharyngeal residue - pyriform Pharyngeal Material enters airway, passes BELOW cords without attempt by patient to eject out (silent aspiration) Pharyngeal- Thin  Straw -- Pharyngeal -- Pharyngeal- Puree Delayed swallow initiation-vallecula;Reduced anterior laryngeal mobility;Reduced laryngeal elevation;Reduced airway/laryngeal closure;Reduced tongue base retraction;Pharyngeal residue - valleculae;Pharyngeal residue - pyriform Pharyngeal -- Pharyngeal- Mechanical Soft -- Pharyngeal -- Pharyngeal- Regular -- Pharyngeal -- Pharyngeal- Multi-consistency -- Pharyngeal -- Pharyngeal- Pill -- Pharyngeal -- Pharyngeal Comment --  No flowsheet data found. No flowsheet data found. Page, [name] 06/29/2015, 3:21 PM              Results for orders placed or performed during the hospital encounter of 07/08/2015 (from the past 72 hour(s))  Glucose, capillary     Status: Abnormal   Collection Time: 06/27/15 10:27 AM  Result Value Ref Range   Glucose-Capillary 127 (H) 65 - 99 mg/dL  Glucose, capillary  Status: Abnormal   Collection Time: 06/27/15 11:32 AM  Result Value Ref Range   Glucose-Capillary 152 (H) 65 - 99 mg/dL  Glucose, capillary     Status: Abnormal   Collection Time: 06/27/15  4:25 PM  Result Value Ref Range   Glucose-Capillary 174 (H) 65 - 99 mg/dL  Glucose, capillary     Status: Abnormal   Collection Time: 06/27/15  8:28 PM  Result Value Ref Range   Glucose-Capillary 131 (H) 65 - 99 mg/dL  Glucose, capillary     Status: Abnormal   Collection Time: 06/27/15 11:54 PM  Result Value Ref Range   Glucose-Capillary 116 (H) 65 - 99 mg/dL  Glucose, capillary     Status: Abnormal   Collection Time: 06/28/15  4:12 AM  Result Value Ref Range   Glucose-Capillary 154 (H) 65 - 99 mg/dL  Glucose, capillary     Status: Abnormal   Collection Time: 06/28/15  9:14 AM  Result Value Ref Range   Glucose-Capillary 146 (H) 65 - 99 mg/dL  Glucose, capillary     Status: Abnormal   Collection Time: 06/28/15 11:57 AM  Result Value Ref Range   Glucose-Capillary 147 (H) 65 - 99 mg/dL  Glucose, capillary     Status: Abnormal   Collection Time: 06/28/15  4:34 PM  Result  Value Ref Range   Glucose-Capillary 167 (H) 65 - 99 mg/dL  Glucose, capillary     Status: Abnormal   Collection Time: 06/28/15  8:10 PM  Result Value Ref Range   Glucose-Capillary 154 (H) 65 - 99 mg/dL  Heparin level (unfractionated)     Status: Abnormal   Collection Time: 06/28/15 11:18 PM  Result Value Ref Range   Heparin Unfractionated 0.21 (L) 0.30 - 0.70 IU/mL    Comment:        IF HEPARIN RESULTS ARE BELOW EXPECTED VALUES, AND PATIENT DOSAGE HAS BEEN CONFIRMED, SUGGEST FOLLOW UP TESTING OF ANTITHROMBIN III LEVELS.   Glucose, capillary     Status: Abnormal   Collection Time: 06/29/15 12:06 AM  Result Value Ref Range   Glucose-Capillary 117 (H) 65 - 99 mg/dL  Glucose, capillary     Status: Abnormal   Collection Time: 06/29/15  4:02 AM  Result Value Ref Range   Glucose-Capillary 153 (H) 65 - 99 mg/dL  CBC     Status: Abnormal   Collection Time: 06/29/15  5:30 AM  Result Value Ref Range   WBC 5.3 4.0 - 10.5 K/uL   RBC 2.98 (L) 4.22 - 5.81 MIL/uL   Hemoglobin 8.1 (L) 13.0 - 17.0 g/dL   HCT 25.7 (L) 39.0 - 52.0 %   MCV 86.2 78.0 - 100.0 fL   MCH 27.2 26.0 - 34.0 pg   MCHC 31.5 30.0 - 36.0 g/dL   RDW 17.9 (H) 11.5 - 15.5 %   Platelets 243 150 - 400 K/uL  Basic metabolic panel     Status: Abnormal   Collection Time: 06/29/15  5:30 AM  Result Value Ref Range   Sodium 135 135 - 145 mmol/L   Potassium 3.8 3.5 - 5.1 mmol/L   Chloride 104 101 - 111 mmol/L   CO2 22 22 - 32 mmol/L   Glucose, Bld 173 (H) 65 - 99 mg/dL   BUN <5 (L) 6 - 20 mg/dL   Creatinine, Ser 1.09 0.61 - 1.24 mg/dL   Calcium 9.1 8.9 - 10.3 mg/dL   GFR calc non Af Amer >60 >60 mL/min   GFR calc Af Amer >  60 >60 mL/min    Comment: (NOTE) The eGFR has been calculated using the CKD EPI equation. This calculation has not been validated in all clinical situations. eGFR's persistently <60 mL/min signify possible Chronic Kidney Disease.    Anion gap 9 5 - 15  Glucose, capillary     Status: Abnormal    Collection Time: 06/29/15  7:28 AM  Result Value Ref Range   Glucose-Capillary 140 (H) 65 - 99 mg/dL  Heparin level (unfractionated)     Status: None   Collection Time: 06/29/15  8:15 AM  Result Value Ref Range   Heparin Unfractionated 0.58 0.30 - 0.70 IU/mL    Comment:        IF HEPARIN RESULTS ARE BELOW EXPECTED VALUES, AND PATIENT DOSAGE HAS BEEN CONFIRMED, SUGGEST FOLLOW UP TESTING OF ANTITHROMBIN III LEVELS.   Glucose, capillary     Status: Abnormal   Collection Time: 06/29/15 11:35 AM  Result Value Ref Range   Glucose-Capillary 163 (H) 65 - 99 mg/dL  Glucose, capillary     Status: Abnormal   Collection Time: 06/29/15  4:43 PM  Result Value Ref Range   Glucose-Capillary 194 (H) 65 - 99 mg/dL   Comment 1 Notify RN   Heparin level (unfractionated)     Status: None   Collection Time: 06/29/15  6:25 PM  Result Value Ref Range   Heparin Unfractionated 0.51 0.30 - 0.70 IU/mL    Comment:        IF HEPARIN RESULTS ARE BELOW EXPECTED VALUES, AND PATIENT DOSAGE HAS BEEN CONFIRMED, SUGGEST FOLLOW UP TESTING OF ANTITHROMBIN III LEVELS.   Glucose, capillary     Status: Abnormal   Collection Time: 06/29/15 10:01 PM  Result Value Ref Range   Glucose-Capillary 189 (H) 65 - 99 mg/dL  Glucose, capillary     Status: Abnormal   Collection Time: 06/30/15  1:00 AM  Result Value Ref Range   Glucose-Capillary 141 (H) 65 - 99 mg/dL  Glucose, capillary     Status: Abnormal   Collection Time: 06/30/15  5:02 AM  Result Value Ref Range   Glucose-Capillary 147 (H) 65 - 99 mg/dL  CBC     Status: Abnormal   Collection Time: 06/30/15  7:06 AM  Result Value Ref Range   WBC 4.8 4.0 - 10.5 K/uL   RBC 3.01 (L) 4.22 - 5.81 MIL/uL   Hemoglobin 8.0 (L) 13.0 - 17.0 g/dL   HCT 25.9 (L) 39.0 - 52.0 %   MCV 86.0 78.0 - 100.0 fL   MCH 26.6 26.0 - 34.0 pg   MCHC 30.9 30.0 - 36.0 g/dL   RDW 17.9 (H) 11.5 - 15.5 %   Platelets 231 150 - 400 K/uL  Basic metabolic panel     Status: Abnormal   Collection  Time: 06/30/15  7:06 AM  Result Value Ref Range   Sodium 134 (L) 135 - 145 mmol/L   Potassium 4.4 3.5 - 5.1 mmol/L   Chloride 105 101 - 111 mmol/L   CO2 20 (L) 22 - 32 mmol/L   Glucose, Bld 157 (H) 65 - 99 mg/dL   BUN <5 (L) 6 - 20 mg/dL   Creatinine, Ser 1.20 0.61 - 1.24 mg/dL   Calcium 8.8 (L) 8.9 - 10.3 mg/dL   GFR calc non Af Amer 57 (L) >60 mL/min   GFR calc Af Amer >60 >60 mL/min    Comment: (NOTE) The eGFR has been calculated using the CKD EPI equation. This calculation has not been  validated in all clinical situations. eGFR's persistently <60 mL/min signify possible Chronic Kidney Disease.    Anion gap 9 5 - 15  Heparin level (unfractionated)     Status: None   Collection Time: 06/30/15  7:06 AM  Result Value Ref Range   Heparin Unfractionated 0.40 0.30 - 0.70 IU/mL    Comment:        IF HEPARIN RESULTS ARE BELOW EXPECTED VALUES, AND PATIENT DOSAGE HAS BEEN CONFIRMED, SUGGEST FOLLOW UP TESTING OF ANTITHROMBIN III LEVELS.   Glucose, capillary     Status: Abnormal   Collection Time: 06/30/15  7:14 AM  Result Value Ref Range   Glucose-Capillary 169 (H) 65 - 99 mg/dL  Glucose, capillary     Status: Abnormal   Collection Time: 06/30/15  8:04 AM  Result Value Ref Range   Glucose-Capillary 157 (H) 65 - 99 mg/dL     HEENT: normal Cardio: irregular irregular Resp: CTA B/L and unlabored GI: BS positive and nontender nondistended Extremity:  No Edema Skin:   Intact Neuro: Flat, Abnormal Sensory cannot assess secondary to aphasia, Abnormal Motor 2 minus finger flexors, biceps and triceps , 0 at deltoid, 2 minus at the hip knee extensors as well as plantar flexors. 0 at the ankle dorsiflexors., Dysarthric and Aphasic, increased facial droop Musc/Skel:  Normal Gen. no acute distress   Assessment/Plan: 1. 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 Physiatrist is providing close team supervision and 24 hour management of active medical problems listed below. Physiatrist and rehab team continue to assess barriers to discharge/monitor patient progress toward functional and medical goals. FIM: Function - Bathing Position: Shower Body parts bathed by patient: Chest, Abdomen, Front perineal area, Right upper leg, Left upper leg Body parts bathed by helper: Right arm, Left arm, Buttocks, Right lower leg, Left lower leg, Back Bathing not applicable: Right upper leg Assist Level:  (Mod assist, all completed at seated level on roll-in shower chair)  Function- Upper Body Dressing/Undressing What is the patient wearing?: Pull over shirt/dress Pull over shirt/dress - Perfomed by patient: Put head through opening, Thread/unthread right sleeve Pull over shirt/dress - Perfomed by helper: Thread/unthread left sleeve, Pull shirt over trunk Assist Level:  (Mod assist) Function - Lower Body Dressing/Undressing What is the patient wearing?: Pants, Non-skid slipper socks Position: Bed Pants- Performed by helper: Thread/unthread right pants leg, Thread/unthread left pants leg, Pull pants up/down Non-skid slipper socks- Performed by helper: Don/doff right sock, Don/doff left sock Assist for footwear: Dependant Assist for lower body dressing:  (Total assist)  Function - Toileting Toileting activity did not occur: No continent bowel/bladder event Assist level: Two helpers (per [employer], NT)  Function - Air cabin crew transfer activity did not occur: Safety/medical concerns Assist level to toilet: 2 helpers (per Berkley Harvey, NT) Assist level from toilet: 2 helpers  Function - Chair/bed transfer Chair/bed transfer activity did not occur: Safety/medical concerns Chair/bed transfer method: Lateral scoot Chair/bed transfer assist level: 2 helpers Chair/bed transfer assistive device: Sliding board,  Armrests Mechanical lift: Stedy Chair/bed transfer details: Verbal cues for precautions/safety, Manual facilitation for weight shifting, Manual facilitation for placement, Verbal cues for sequencing, Verbal cues for technique, Tactile cues for placement, Tactile cues for posture  Function - Locomotion: Wheelchair Will patient use wheelchair at discharge?: Yes Type: Manual Max wheelchair distance: 75 Assist Level: Maximal assistance (Pt 25 - 49%) Assist Level:  Maximal assistance (Pt 25 - 49%) Wheel 150 feet activity did not occur: Safety/medical concerns Turns around,maneuvers to table,bed, and toilet,negotiates 3% grade,maneuvers on rugs and over doorsills: No Function - Locomotion: Ambulation Ambulation activity did not occur: Safety/medical concerns Assistive device: Rail in hallway Max distance: 25 Assist level: Maximal assist (Pt 25 - 49%) Assist level: Maximal assist (Pt 25 - 49%)  Function - Comprehension Comprehension: Auditory Comprehension assistive device: Hearing aids Comprehension assist level: Understands basic 75 - 89% of the time/ requires cueing 10 - 24% of the time  Function - Expression Expression: Verbal Expression assist level: Expresses basic 50 - 74% of the time/requires cueing 25 - 49% of the time. Needs to repeat parts of sentences.  Function - Social Interaction Social Interaction assist level: Interacts appropriately 50 - 74% of the time - May be physically or verbally inappropriate.  Function - Problem Solving Problem solving assist level: Solves basic 50 - 74% of the time/requires cueing 25 - 49% of the time  Function - Memory Memory assist level: Recognizes or recalls 25 - 49% of the time/requires cueing 50 - 75% of the time Patient normally able to recall (first 3 days only): Current season    Medical Problem List and Plan: 1.  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 Plan to follow-up MRI of the brain  after ICH absorbed for further evaluation of possible metastases posterior left frontal lobe             - Team conference today please see physician documentation under team conference tab, met with team face-to-face to discuss problems,progress, and goals. Formulized individual treatment plan based on medical history, underlying problem and comorbidities. 2.  DVT Prophylaxis/Anticoagulation: SCDs. Monitor for signs of DVT 3. Pain Management: Ultram and oxycodone as needed 4. Mood: Xanax 0.25 mg 3 times daily as needed 5. Neuropsych: This patient is capable of making decisions on his own behalf with caregiver/staff assistance.Emotional lability and poor sleep will initiate Remeron 6. Skin/Wound Care: Routine skin checks 7. Fluids/Electrolytes/Nutrition: Routine I&O's with follow-up chemistries, Resumed oral feedings8. Seizure prophylaxis. Keppra now at 1000 mg twice a day---more alert. EEG displayed seizure activity 9. Atrial fibrillation. Cardiac rate control. Continue amiodarone 200 mg daily, Cardizem 60 mg twice a day.Eliquis held secondary to [place]:   06/29/15 2217 06/30/15 0408  BP: 135/60 115/56  Pulse:  88  Temp:  97.6 F (36.4 C)  Resp:  18   10. 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 This is likely the etiology of the persistent cough, Right lung nodules,As noted plan follow-up MRI after ICH absorbed for further evaluation of possible metastasis 11. Diabetes mellitus with peripheral neuropathy. Hemoglobin A1c 6.8. Glucophage 1000 mg at breakfast and 500 mg supper , Generally well controlled on the current regimen Amaryl 1 mg daily. Check blood sugars before meals and at bedtime CBG (last 3) generally controlled  Recent Labs  06/30/15 0502 06/30/15 0714 06/30/15 0804  GLUCAP 147* 169* 157*    12. Hyperlipidemia. Lipitor 13. UTI enterobacter cloacae- Sensitive to ceftriaxone. We'll start 1 g  every 24 hours.  Afebrile , 7 day course                    LOS (Days) 9 A FACE TO FACE EVALUATION WAS PERFORMED  KIRSTEINS,ANDREW E 06/30/2015, 9:52 AM

## 2015-06-30 NOTE — Progress Notes (Signed)
Speech Language Pathology Daily Session Note  Patient Details  Name: Ivan Beasley MRN: [id number] Date of Birth: 1940-08-25  Today's Date: 06/30/2015 SLP Individual Time: 7841-2820 SLP Individual Time Calculation (min): 42 min  Short Term Goals: Week 2: SLP Short Term Goal 1 (Week 2): Pt will be intelligible in phrases with mod assist verbal cues for overarticulation, increased vocal intensity,  SLP Short Term Goal 2 (Week 2): Pt will consume Dys 1 textures and nectar thick liquids with min assist verbal cues for use of swallowing precautions and minimal overt s/s of aspiration.  SLP Short Term Goal 3 (Week 2): Pt will consume therapeutic trials of thin liquids via teaspoon with min assist verbal cues for use of swallowing precautions and minimal overt s/s of aspiration.  SLP Short Term Goal 4 (Week 2): Pt will return demonstration of pharyngeal strengthening exercises for 25 repetitions each with min assist verbal cues.   SLP Short Term Goal 5 (Week 2): Pt will sustain his attention to basic tasks for 2-3 minute intervals with min verbal cues for redirection.    Skilled Therapeutic Interventions:  Pt was seen for skilled ST targeting goals for communication.  Pt was bright and alert upon arrival.  Wife reports good toleration of diet at lunch, other than magic cup ice cream which she reported "got stuck" during swallow.  Wife reported that after this happened x3, she removed ice cream from lunch tray.  SLP initiated skilled instruction regarding compensatory intelligibility strategies.  Handout provided to facilitate carryover in between therapy session.  Therapist facilitated the session with a verbal picture description task targeting intelligibility in phrases.  Pt required mod assist verbal and visual cues for overarticulation, increased vocal intensity, and slow rate to achieve intelligibility in phrases.  Pt was left in bed at the end of today's therapy session with wife at bedside.  Continue per current plan of care.     Function:  Eating Eating              Cognition Comprehension Comprehension assist level: Understands basic 75 - 89% of the time/ requires cueing 10 - 24% of the time  Expression   Expression assist level: Expresses basic 50 - 74% of the time/requires cueing 25 - 49% of the time. Needs to repeat parts of sentences.  Social Interaction Social Interaction assist level: Interacts appropriately 50 - 74% of the time - May be physically or verbally inappropriate.  Problem Solving Problem solving assist level: Solves basic 50 - 74% of the time/requires cueing 25 - 49% of the time  Memory Memory assist level: Recognizes or recalls 50 - 74% of the time/requires cueing 25 - 49% of the time    Pain Pain Assessment Pain Assessment: No/denies pain   Therapy/Group: Individual Therapy  Cinque Begley, [name] 06/30/2015, 3:50 PM

## 2015-07-01 ENCOUNTER — Inpatient Hospital Stay (HOSPITAL_COMMUNITY): Payer: Medicare HMO | Admitting: Speech Pathology

## 2015-07-01 ENCOUNTER — Inpatient Hospital Stay (HOSPITAL_COMMUNITY): Payer: Medicare HMO | Admitting: Physical Therapy

## 2015-07-01 ENCOUNTER — Inpatient Hospital Stay (HOSPITAL_COMMUNITY): Payer: Medicare HMO | Admitting: Occupational Therapy

## 2015-07-01 ENCOUNTER — Encounter (HOSPITAL_COMMUNITY): Payer: Medicare HMO

## 2015-07-01 DIAGNOSIS — F4323 Adjustment disorder with mixed anxiety and depressed mood: Secondary | ICD-10-CM | POA: Insufficient documentation

## 2015-07-01 DIAGNOSIS — I619 Nontraumatic intracerebral hemorrhage, unspecified: Secondary | ICD-10-CM

## 2015-07-01 LAB — BASIC METABOLIC PANEL
Anion gap: 8 (ref 5–15)
BUN: <5 mg/dL — ABNORMAL LOW (ref 6–20)
CALCIUM: 9.3 mg/dL (ref 8.9–10.3)
CHLORIDE: 106 mmol/L (ref 101–111)
CO2: 21 mmol/L — AB (ref 22–32)
Creatinine, Ser: 1.14 mg/dL (ref 0.61–1.24)
GFR calc Af Amer: >60 mL/min (ref >60)
GFR calc non Af Amer: >60 mL/min (ref >60)
Glucose, Bld: 146 mg/dL — ABNORMAL HIGH (ref 65–99)
POTASSIUM: 4.7 mmol/L (ref 3.5–5.1)
SODIUM: 135 mmol/L (ref 135–145)

## 2015-07-01 LAB — CULTURE, BLOOD (ROUTINE X 2)
Culture: NO GROWTH
Culture: NO GROWTH

## 2015-07-01 LAB — CBC
HCT: 27.1 % — ABNORMAL LOW (ref 39.0–52.0)
Hemoglobin: 8.2 g/dL — ABNORMAL LOW (ref 13.0–17.0)
MCH: 26.6 pg (ref 26.0–34.0)
MCHC: 30.3 g/dL (ref 30.0–36.0)
MCV: 88 fL (ref 78.0–100.0)
PLATELETS: 222 10*3/uL (ref 150–400)
RBC: 3.08 MIL/uL — AB (ref 4.22–5.81)
RDW: 17.8 % — ABNORMAL HIGH (ref 11.5–15.5)
WBC: 5 10*3/uL (ref 4.0–10.5)

## 2015-07-01 LAB — GLUCOSE, CAPILLARY
GLUCOSE-CAPILLARY: 137 mg/dL — AB (ref 65–99)
GLUCOSE-CAPILLARY: 141 mg/dL — AB (ref 65–99)
Glucose-Capillary: 138 mg/dL — ABNORMAL HIGH (ref 65–99)
Glucose-Capillary: 133 mg/dL — ABNORMAL HIGH (ref 65–99)

## 2015-07-01 LAB — HEPARIN LEVEL (UNFRACTIONATED): Heparin Unfractionated: 0.35 IU/mL (ref 0.30–0.70)

## 2015-07-01 MED ORDER — PANTOPRAZOLE SODIUM 40 MG PO PACK
40.0000 mg | PACK | Freq: Every day | ORAL | Status: DC
  Administered 2015-07-01 – 2015-07-13 (×13): 40 mg via ORAL
  Filled 2015-07-01 (×5): qty 20

## 2015-07-01 MED ORDER — DM-GUAIFENESIN ER 30-600 MG PO TB12
1.0000 | ORAL_TABLET | Freq: Two times a day (BID) | ORAL | Status: DC
  Filled 2015-07-01: qty 1

## 2015-07-01 MED ORDER — STARCH (THICKENING) PO POWD
ORAL | Status: DC | PRN
  Filled 2015-07-01: qty 227

## 2015-07-01 NOTE — Progress Notes (Signed)
Speech Language Pathology Daily Session Note  Patient Details  Name: Ivan Beasley MRN: [id number] Date of Birth: 1940-08-23  Today's Date: 07/01/2015 SLP Individual Time: 1505-1530 SLP Individual Time Calculation (min): 25 min  Short Term Goals: Week 2: SLP Short Term Goal 1 (Week 2): Pt will be intelligible in phrases with mod assist verbal cues for overarticulation, increased vocal intensity,  SLP Short Term Goal 2 (Week 2): Pt will consume Dys 1 textures and nectar thick liquids with min assist verbal cues for use of swallowing precautions and minimal overt s/s of aspiration.  SLP Short Term Goal 3 (Week 2): Pt will consume therapeutic trials of thin liquids via teaspoon with min assist verbal cues for use of swallowing precautions and minimal overt s/s of aspiration.  SLP Short Term Goal 4 (Week 2): Pt will return demonstration of pharyngeal strengthening exercises for 25 repetitions each with min assist verbal cues.   SLP Short Term Goal 5 (Week 2): Pt will sustain his attention to basic tasks for 2-3 minute intervals with min verbal cues for redirection.    Skilled Therapeutic Interventions:  Pt was seen for skilled ST targeting goals for communication and ongoing family education re: dysphagia. Upon arrival, pt with reports of "pain all over."  Pt initially labile when instructed to call for his own medications stating "I'll mess it up."  However, after multiple repetitions for practice, pt was able to utilize call bell and convey needs to nursing with mod assist verbal cues for increased vocal intensity to achieve intelligibility.  SLP also continued ongoing discussion with pt's wife regarding pt's risk of aspiration given respiratory compromise from premorbid medical conditions.  [name] verbalized understanding of risk and expressed desire to continue with PO diet to prevent further deconditioning of swallow.  Therapist also provided skilled education regarding how to complete oral  hygiene with suction toothette.  All questions were answered to pt's and wife's satisfaction at this time.  Continue per current plan of care.    Function:  Eating Eating                 Cognition Comprehension Comprehension assist level: Understands basic 75 - 89% of the time/ requires cueing 10 - 24% of the time  Expression   Expression assist level: Expresses basic 50 - 74% of the time/requires cueing 25 - 49% of the time. Needs to repeat parts of sentences.  Social Interaction Social Interaction assist level: Interacts appropriately 50 - 74% of the time - May be physically or verbally inappropriate.  Problem Solving Problem solving assist level: Solves basic 50 - 74% of the time/requires cueing 25 - 49% of the time  Memory Memory assist level: Recognizes or recalls 50 - 74% of the time/requires cueing 25 - 49% of the time    Pain Pain Assessment Pain Assessment: Faces Faces Pain Scale: Hurts little more Pain Type: Acute pain Pain Location: Generalized Pain Descriptors / Indicators: Aching Pain Intervention(s): RN made aware  Therapy/Group: Individual Therapy  Celese Banner, [name] 07/01/2015, 4:29 PM

## 2015-07-01 NOTE — Progress Notes (Signed)
Physical Therapy Session Note  Patient Details  Name: Ivan Beasley MRN: [id number] Date of Birth: [birth year]  Today's Date: 07/01/2015 PT Individual Time: 1000-1100 PT Individual Time Calculation (min): 60 min   Short Term Goals: Week 2:  PT Short Term Goal 1 (Week 2): Pt will perform supine<>sit with modA PT Short Term Goal 2 (Week 2): Pt will perform transfer w/c <>bed with maxA +1 PT Short Term Goal 3 (Week 2): Pt will perform sit <>stand maxA PT Short Term Goal 4 (Week 2): Pt will demonstrate sitting balance x3 min with minA PT Short Term Goal 5 (Week 2): Pt will initiate gait training  Skilled Therapeutic Interventions/Progress Updates:    Pt received supine in bed, denies pain and agreeable to treatment. Rolling L with modA, L sidelying >sit with modA and poor recall of cues for LE management and LUE coordination to get LEs off bed and sit up. Dressing performed EOB with maxA for shirt due to line management and threading RUE, +2A for lower body with sit <>Stand to pull up pants. Lateral scoot transfer with transfer board to w/c +2A, poor response to cues for L leaning to unweigh hips. Sit <>stand in parallel bars x7 total trials with +2A, LUE on parallel bars and max cueing for hip/trunk extension. Wife positioned in front of pt to A with forward gaze and upright posture. Demonstrates LLE preference with maxA needed at R knee to encourage knee extension and weight bearing. Headrest adjusted on new tilt in space w/c to allow for better head support and alignment while resting. Remained reclined in w/c at completion of session, wife present and all needs within reach.   Therapy Documentation Precautions:  Precautions Precautions: Fall Precaution Comments: R hemi, pt on home O2 PTA for recent pneumonia (but used it PRN, not 24/7) Restrictions Weight Bearing Restrictions: No Pain: Pain Assessment Pain Assessment: No/denies pain   See Function Navigator for Current Functional  Status.   Therapy/Group: Individual Therapy  Luberta Mutter 07/01/2015, 11:06 AM

## 2015-07-01 NOTE — Consult Note (Signed)
NEUROCOGNITIVE [place]   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    Emotional Functioning:  [name] acknowledged worry about whether or not he will survive his current medical ordeal.  He said that when he has thoughts like that, he tries not to think about them, which has been helpful for him.  Of note, [name] was highly fatigued during the current session and was falling asleep as he was talking to the neuropsychologist.  He said that he did not want to talk about his stressors at the current time and would prefer time to rest.  Therefore, the session was cut short.  Prior to the session ending, he was able to complete two self-report measures of mood symptoms.  His responses to those measures were suggestive of the presence of mild depressed mood and severe anxiety at this time.    Cognitive Functioning:  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  This score includes one additional point for lower education.  [name] lost points on the mental status measure for inability to repeat a series of 3 digits backward, inability to successfully perform a task of serial subtraction, inability to repeat either of two sentences, trouble generating words according to a letter prompt, poor verbal abstract reasoning, and inability to freely recall any of 5 previously studied words after a brief delay.  He was, however, oriented to date and location.    IMPRESSION:  [name] demonstrated significant cognitive disruption at the level of a Major Neurocognitive Disorder (i.e.  dementia).  Although he was oriented, most of his other cognitive skills were significantly impaired.  From an emotional standpoint, he seems to be experiencing severe anxiety as well as mild depressed mood.  However, he was not able to engage in enough of a clinical conversation to determine the nature and extent of his mood disruption.  Therefore, a diagnosis of adjustment disorder with mixed anxiety and depression will be offered at this time.  A follow-up with the neuropsychologist for continued assistance with mood management and/or to re-evaluate his mental status prior to discharge can be scheduled, as desired by the treatment team.    DIAGNOSES:   Intracerebral hemorrhage Adjustment disorder with mixed anxiety and depressed mood  Marlane Hatcher, Psy.D.  Clinical Neuropsychologist

## 2015-07-01 NOTE — Progress Notes (Signed)
ANTICOAGULATION CONSULT NOTE - Follow Up Consult  Pharmacy Consult for heparin Indication: atrial fibrillation  No Known Allergies  Patient Measurements: Weight: 206 lb 8 oz (93.668 kg) Heparin Dosing Weight: 93.7 kg  Vital Signs: Temp: 97.7 F (36.5 C) (06/22 0553) Temp Source: Oral (06/22 0553) BP: 130/65 mmHg (06/22 0553) Pulse Rate: 87 (06/22 0553)  Labs:  Recent Labs  06/29/15 0530  06/29/15 1825 06/30/15 0706 07/01/15 0456 07/01/15 0501  HGB 8.1*  --   --  8.0*  --  8.2*  HCT 25.7*  --   --  25.9*  --  27.1*  PLT 243  --   --  231  --  222  HEPARINUNFRC  --   < > 0.51 0.40 0.35  --   CREATININE 1.09  --   --  1.20  --  1.14  < > = values in this interval not displayed.  Estimated Creatinine Clearance: 66.5 mL/min (by C-G formula based on Cr of 1.14).   Medications:  Scheduled:  . amiodarone  200 mg Oral Daily  . antiseptic oral rinse  7 mL Mouth Rinse BID  . atorvastatin  40 mg Oral q1800  . cefTRIAXone (ROCEPHIN)  IV  1 g Intravenous Q24H  . diltiazem  60 mg Oral BID  . glimepiride  1 mg Oral QAC breakfast  . insulin aspart  0-24 Units Subcutaneous TID AC & HS  . lactose free nutrition  237 mL Oral TID WC  . levETIRAcetam  1,000 mg Intravenous Q12H  . metFORMIN  1,000 mg Oral Q breakfast  . metFORMIN  500 mg Oral Q supper  . mirtazapine  7.5 mg Oral QHS  . pantoprazole  40 mg Oral Daily   Infusions:  . dextrose 5 % and 0.45 % NaCl with KCl 20 mEq/L 125 mL/hr at 07/01/15 0108  . heparin 1,350 Units/hr (07/01/15 0123)    Assessment: 75 yo male with afib is currently on therapeutic heparin.  Heparin level is 0.35.  Goal of Therapy:  Heparin level 0.3-0.5 units/ml Monitor platelets by anticoagulation protocol: Yes   Plan:  - Continue heparin at 1350 units/hr - daily heparin level and CBC - watch for signs of bleeding  Falisha Osment, Tsz-Yin 07/01/2015,8:25 AM

## 2015-07-01 NOTE — Progress Notes (Signed)
Speech Language Pathology Daily Session Note  Patient Details  Name: Ivan Beasley MRN: [id number] Date of Birth: [birth year]  Today's Date: 07/01/2015 SLP Individual Time: 4103-0131 SLP Individual Time Calculation (min): 43 min  Short Term Goals: Week 2: SLP Short Term Goal 1 (Week 2): Pt will be intelligible in phrases with mod assist verbal cues for overarticulation, increased vocal intensity,  SLP Short Term Goal 2 (Week 2): Pt will consume Dys 1 textures and nectar thick liquids with min assist verbal cues for use of swallowing precautions and minimal overt s/s of aspiration.  SLP Short Term Goal 3 (Week 2): Pt will consume therapeutic trials of thin liquids via teaspoon with min assist verbal cues for use of swallowing precautions and minimal overt s/s of aspiration.  SLP Short Term Goal 4 (Week 2): Pt will return demonstration of pharyngeal strengthening exercises for 25 repetitions each with min assist verbal cues.   SLP Short Term Goal 5 (Week 2): Pt will sustain his attention to basic tasks for 2-3 minute intervals with min verbal cues for redirection.    Skilled Therapeutic Interventions:  Pt was seen for skilled ST targeting goals for dysphagia.  Pt continues to have congested cough outside of PO intake which did not increase in frequency or intensity with cup sips of nectar thick liquids.  SLP provided skilled education regarding pharyngeal strengthening exercises (effortful swallow and chin tuck against resistance) to continue to work towards improved swallowing function for diet advancement.  Pt was able to return demonstration of exercises with min assist verbal and tactile cues.  Wife was also educated during today's therapy session regarding dys 1 textures.  Handout was provided to maximize the variety of pt's choices when ordering meals given that pt and wife report poor PO intake of current diet.  All questions were answered to pt's and wife's satisfaction at this time.   Continue per current plan of care.   Function:  Eating Eating     Eating Assist Level: Help with picking up utensils;Help managing cup/glass;Supervision or verbal cues           Cognition Comprehension Comprehension assist level: Understands basic 75 - 89% of the time/ requires cueing 10 - 24% of the time  Expression   Expression assist level: Expresses basic 50 - 74% of the time/requires cueing 25 - 49% of the time. Needs to repeat parts of sentences.  Social Interaction Social Interaction assist level: Interacts appropriately 50 - 74% of the time - May be physically or verbally inappropriate.  Problem Solving Problem solving assist level: Solves basic 50 - 74% of the time/requires cueing 25 - 49% of the time  Memory Memory assist level: Recognizes or recalls 50 - 74% of the time/requires cueing 25 - 49% of the time    Pain Pain Assessment Pain Assessment: No/denies pain  Therapy/Group: Individual Therapy  Ivan Beasley, Ivan Beasley 07/01/2015, 12:29 PM

## 2015-07-01 NOTE — Progress Notes (Signed)
Subjective/Complaints: Less labile but appetite worse today, d/w SLP Review of systems aphasic limiting review Objective: Vital Signs: Blood pressure 143/68, pulse 86, temperature 97.6 F (36.4 C), temperature source Oral, resp. rate 18, weight 93.668 kg (206 lb 8 oz), SpO2 93 %. No results found. Results for orders placed or performed during the hospital encounter of 06/18/2015 (from the past 72 hour(s))  Glucose, capillary     Status: Abnormal   Collection Time: 06/28/15  8:10 PM  Result Value Ref Range   Glucose-Capillary 154 (H) 65 - 99 mg/dL  Heparin level (unfractionated)     Status: Abnormal   Collection Time: 06/28/15 11:18 PM  Result Value Ref Range   Heparin Unfractionated 0.21 (L) 0.30 - 0.70 IU/mL    Comment:        IF HEPARIN RESULTS ARE BELOW EXPECTED VALUES, AND PATIENT DOSAGE HAS BEEN CONFIRMED, SUGGEST FOLLOW UP TESTING OF ANTITHROMBIN III LEVELS.   Glucose, capillary     Status: Abnormal   Collection Time: 06/29/15 12:06 AM  Result Value Ref Range   Glucose-Capillary 117 (H) 65 - 99 mg/dL  Glucose, capillary     Status: Abnormal   Collection Time: 06/29/15  4:02 AM  Result Value Ref Range   Glucose-Capillary 153 (H) 65 - 99 mg/dL  CBC     Status: Abnormal   Collection Time: 06/29/15  5:30 AM  Result Value Ref Range   WBC 5.3 4.0 - 10.5 K/uL   RBC 2.98 (L) 4.22 - 5.81 MIL/uL   Hemoglobin 8.1 (L) 13.0 - 17.0 g/dL   HCT 25.7 (L) 39.0 - 52.0 %   MCV 86.2 78.0 - 100.0 fL   MCH 27.2 26.0 - 34.0 pg   MCHC 31.5 30.0 - 36.0 g/dL   RDW 17.9 (H) 11.5 - 15.5 %   Platelets 243 150 - 400 K/uL  Basic metabolic panel     Status: Abnormal   Collection Time: 06/29/15  5:30 AM  Result Value Ref Range   Sodium 135 135 - 145 mmol/L   Potassium 3.8 3.5 - 5.1 mmol/L   Chloride 104 101 - 111 mmol/L   CO2 22 22 - 32 mmol/L   Glucose, Bld 173 (H) 65 - 99 mg/dL   BUN <5 (L) 6 - 20 mg/dL   Creatinine, Ser 1.09 0.61 - 1.24 mg/dL   Calcium 9.1 8.9 - 10.3 mg/dL   GFR calc  non Af Amer >60 >60 mL/min   GFR calc Af Amer >60 >60 mL/min    Comment: (NOTE) The eGFR has been calculated using the CKD EPI equation. This calculation has not been validated in all clinical situations. eGFR's persistently <60 mL/min signify possible Chronic Kidney Disease.    Anion gap 9 5 - 15  Glucose, capillary     Status: Abnormal   Collection Time: 06/29/15  7:28 AM  Result Value Ref Range   Glucose-Capillary 140 (H) 65 - 99 mg/dL  Heparin level (unfractionated)     Status: None   Collection Time: 06/29/15  8:15 AM  Result Value Ref Range   Heparin Unfractionated 0.58 0.30 - 0.70 IU/mL    Comment:        IF HEPARIN RESULTS ARE BELOW EXPECTED VALUES, AND PATIENT DOSAGE HAS BEEN CONFIRMED, SUGGEST FOLLOW UP TESTING OF ANTITHROMBIN III LEVELS.   Glucose, capillary     Status: Abnormal   Collection Time: 06/29/15 11:35 AM  Result Value Ref Range   Glucose-Capillary 163 (H) 65 - 99 mg/dL  Glucose, capillary     Status: Abnormal   Collection Time: 06/29/15  4:43 PM  Result Value Ref Range   Glucose-Capillary 194 (H) 65 - 99 mg/dL   Comment 1 Notify RN   Heparin level (unfractionated)     Status: None   Collection Time: 06/29/15  6:25 PM  Result Value Ref Range   Heparin Unfractionated 0.51 0.30 - 0.70 IU/mL    Comment:        IF HEPARIN RESULTS ARE BELOW EXPECTED VALUES, AND PATIENT DOSAGE HAS BEEN CONFIRMED, SUGGEST FOLLOW UP TESTING OF ANTITHROMBIN III LEVELS.   Glucose, capillary     Status: Abnormal   Collection Time: 06/29/15 10:01 PM  Result Value Ref Range   Glucose-Capillary 189 (H) 65 - 99 mg/dL  Glucose, capillary     Status: Abnormal   Collection Time: 06/30/15  1:00 AM  Result Value Ref Range   Glucose-Capillary 141 (H) 65 - 99 mg/dL  Glucose, capillary     Status: Abnormal   Collection Time: 06/30/15  5:02 AM  Result Value Ref Range   Glucose-Capillary 147 (H) 65 - 99 mg/dL  CBC     Status: Abnormal   Collection Time: 06/30/15  7:06 AM  Result  Value Ref Range   WBC 4.8 4.0 - 10.5 K/uL   RBC 3.01 (L) 4.22 - 5.81 MIL/uL   Hemoglobin 8.0 (L) 13.0 - 17.0 g/dL   HCT 25.9 (L) 39.0 - 52.0 %   MCV 86.0 78.0 - 100.0 fL   MCH 26.6 26.0 - 34.0 pg   MCHC 30.9 30.0 - 36.0 g/dL   RDW 17.9 (H) 11.5 - 15.5 %   Platelets 231 150 - 400 K/uL  Basic metabolic panel     Status: Abnormal   Collection Time: 06/30/15  7:06 AM  Result Value Ref Range   Sodium 134 (L) 135 - 145 mmol/L   Potassium 4.4 3.5 - 5.1 mmol/L   Chloride 105 101 - 111 mmol/L   CO2 20 (L) 22 - 32 mmol/L   Glucose, Bld 157 (H) 65 - 99 mg/dL   BUN <5 (L) 6 - 20 mg/dL   Creatinine, Ser 1.20 0.61 - 1.24 mg/dL   Calcium 8.8 (L) 8.9 - 10.3 mg/dL   GFR calc non Af Amer 57 (L) >60 mL/min   GFR calc Af Amer >60 >60 mL/min    Comment: (NOTE) The eGFR has been calculated using the CKD EPI equation. This calculation has not been validated in all clinical situations. eGFR's persistently <60 mL/min signify possible Chronic Kidney Disease.    Anion gap 9 5 - 15  Heparin level (unfractionated)     Status: None   Collection Time: 06/30/15  7:06 AM  Result Value Ref Range   Heparin Unfractionated 0.40 0.30 - 0.70 IU/mL    Comment:        IF HEPARIN RESULTS ARE BELOW EXPECTED VALUES, AND PATIENT DOSAGE HAS BEEN CONFIRMED, SUGGEST FOLLOW UP TESTING OF ANTITHROMBIN III LEVELS.   Glucose, capillary     Status: Abnormal   Collection Time: 06/30/15  7:14 AM  Result Value Ref Range   Glucose-Capillary 169 (H) 65 - 99 mg/dL  Glucose, capillary     Status: Abnormal   Collection Time: 06/30/15  8:04 AM  Result Value Ref Range   Glucose-Capillary 157 (H) 65 - 99 mg/dL  Glucose, capillary     Status: Abnormal   Collection Time: 06/30/15 11:48 AM  Result Value Ref Range   Glucose-Capillary  158 (H) 65 - 99 mg/dL  Glucose, capillary     Status: Abnormal   Collection Time: 06/30/15  4:34 PM  Result Value Ref Range   Glucose-Capillary 113 (H) 65 - 99 mg/dL  Glucose, capillary      Status: Abnormal   Collection Time: 06/30/15  8:11 PM  Result Value Ref Range   Glucose-Capillary 151 (H) 65 - 99 mg/dL  Heparin level (unfractionated)     Status: None   Collection Time: 07/01/15  4:56 AM  Result Value Ref Range   Heparin Unfractionated 0.35 0.30 - 0.70 IU/mL    Comment:        IF HEPARIN RESULTS ARE BELOW EXPECTED VALUES, AND PATIENT DOSAGE HAS BEEN CONFIRMED, SUGGEST FOLLOW UP TESTING OF ANTITHROMBIN III LEVELS.   CBC     Status: Abnormal   Collection Time: 07/01/15  5:01 AM  Result Value Ref Range   WBC 5.0 4.0 - 10.5 K/uL   RBC 3.08 (L) 4.22 - 5.81 MIL/uL   Hemoglobin 8.2 (L) 13.0 - 17.0 g/dL   HCT 27.1 (L) 39.0 - 52.0 %   MCV 88.0 78.0 - 100.0 fL   MCH 26.6 26.0 - 34.0 pg   MCHC 30.3 30.0 - 36.0 g/dL   RDW 17.8 (H) 11.5 - 15.5 %   Platelets 222 150 - 400 K/uL  Basic metabolic panel     Status: Abnormal   Collection Time: 07/01/15  5:01 AM  Result Value Ref Range   Sodium 135 135 - 145 mmol/L   Potassium 4.7 3.5 - 5.1 mmol/L   Chloride 106 101 - 111 mmol/L   CO2 21 (L) 22 - 32 mmol/L   Glucose, Bld 146 (H) 65 - 99 mg/dL   BUN <5 (L) 6 - 20 mg/dL   Creatinine, Ser 1.14 0.61 - 1.24 mg/dL   Calcium 9.3 8.9 - 10.3 mg/dL   GFR calc non Af Amer >60 >60 mL/min   GFR calc Af Amer >60 >60 mL/min    Comment: (NOTE) The eGFR has been calculated using the CKD EPI equation. This calculation has not been validated in all clinical situations. eGFR's persistently <60 mL/min signify possible Chronic Kidney Disease.    Anion gap 8 5 - 15  Glucose, capillary     Status: Abnormal   Collection Time: 07/01/15  6:34 AM  Result Value Ref Range   Glucose-Capillary 137 (H) 65 - 99 mg/dL  Glucose, capillary     Status: Abnormal   Collection Time: 07/01/15 11:25 AM  Result Value Ref Range   Glucose-Capillary 141 (H) 65 - 99 mg/dL   Comment 1 Notify RN   Glucose, capillary     Status: Abnormal   Collection Time: 07/01/15  3:35 PM  Result Value Ref Range    Glucose-Capillary 138 (H) 65 - 99 mg/dL   Comment 1 Notify RN    Comment 2 Document in Chart      HEENT: normal Cardio: irregular irregular Resp: CTA B/L and unlabored GI: BS positive and nontender nondistended Extremity:  No Edema Skin:   Intact Neuro: Flat, Abnormal Sensory cannot assess secondary to aphasia, Abnormal Motor 2 minus finger flexors, biceps and triceps , 0 at deltoid, 2 minus at the hip knee extensors as well as plantar flexors. 0 at the ankle dorsiflexors., Dysarthric and Aphasic, increased facial droop Musc/Skel:  Normal Gen. no acute distress   Assessment/Plan: 1. 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 Physiatrist is providing close team supervision and 24 hour management of active medical problems listed below. Physiatrist and rehab team continue to assess barriers to discharge/monitor patient progress toward functional and medical goals. FIM: Function - Bathing Position: Bed (bed level for LB, sitting EOB for UB) Body parts bathed by patient: Chest, Abdomen Body parts bathed by helper: Right arm, Left arm, Front perineal area, Buttocks, Right upper leg, Left upper leg, Right lower leg, Left lower leg, Back Bathing not applicable: Right upper leg Assist Level: 2 helpers (Total assist)  Function- Upper Body Dressing/Undressing What is the patient wearing?: Pull over shirt/dress Pull over shirt/dress - Perfomed by patient: Thread/unthread left sleeve Pull over shirt/dress - Perfomed by helper: Thread/unthread right sleeve, Put head through opening, Pull shirt over trunk Assist Level: 2 helpers (Max assist, +2 for sitting balance) Function - Lower Body Dressing/Undressing What is the patient wearing?: Pants, Non-skid slipper socks Position: Bed Pants- Performed by helper: Thread/unthread right pants leg,  Thread/unthread left pants leg, Pull pants up/down Non-skid slipper socks- Performed by helper: Don/doff right sock, Don/doff left sock Assist for footwear: Dependant Assist for lower body dressing:  (Total assist)  Function - Toileting Toileting activity did not occur: No continent bowel/bladder event Assist level: Two helpers (per [employer], NT)  Function - Air cabin crew transfer activity did not occur: Safety/medical concerns Assist level to toilet: 2 helpers (per Berkley Harvey, NT) Assist level from toilet: 2 helpers  Function - Chair/bed transfer Chair/bed transfer activity did not occur: Safety/medical concerns Chair/bed transfer method: Lateral scoot Chair/bed transfer assist level: 2 helpers Chair/bed transfer assistive device: Armrests Mechanical lift: Stedy Chair/bed transfer details: Verbal cues for precautions/safety, Manual facilitation for weight shifting, Manual facilitation for placement, Verbal cues for sequencing, Verbal cues for technique, Tactile cues for placement, Tactile cues for posture, Tactile cues for weight shifting  Function - Locomotion: Wheelchair Will patient use wheelchair at discharge?: Yes Type: Manual Max wheelchair distance: 75 Assist Level: Maximal assistance (Pt 25 - 49%) Assist Level: Maximal assistance (Pt 25 - 49%) Wheel 150 feet activity did not occur: Safety/medical concerns Turns around,maneuvers to table,bed, and toilet,negotiates 3% grade,maneuvers on rugs and over doorsills: No Function - Locomotion: Ambulation Ambulation activity did not occur: Safety/medical concerns Assistive device: Rail in hallway Max distance: 25 Assist level: Maximal assist (Pt 25 - 49%) Assist level: Maximal assist (Pt 25 - 49%)  Function - Comprehension Comprehension: Auditory Comprehension assistive device: Hearing aids Comprehension assist level: Understands basic 75 - 89% of the time/ requires cueing 10 - 24% of the time  Function -  Expression Expression: Verbal Expression assist level: Expresses basic 50 - 74% of the time/requires cueing 25 - 49% of the time. Needs to repeat parts of sentences.  Function - Social Interaction Social Interaction assist level: Interacts appropriately 50 - 74% of the time - May be physically or verbally inappropriate.  Function - Problem Solving Problem solving assist level: Solves basic 50 - 74% of the time/requires cueing 25 - 49% of the time  Function - Memory Memory assist level: Recognizes or recalls 50 - 74% of the time/requires cueing 25 - 49% of the time Patient normally able to recall (first 3 days only): Current season    Medical Problem List and Plan: 1.  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 Plan to follow-up MRI of the brain after ICH absorbed for further evaluation  of possible metastases posterior left frontal lobe             - resume DOAC in 1-2 d if no neuro changes, Cont CIR PT, OT 2.  DVT Prophylaxis/Anticoagulation: SCDs. Monitor for signs of DVT 3. Pain Management: Ultram and oxycodone as needed 4. Mood: Xanax 0.25 mg 3 times daily as needed 5. Neuropsych: This patient is capable of making decisions on his own behalf with caregiver/staff assistance.Emotional lability and poor sleep will trial Remeron 6. Skin/Wound Care: Routine skin checks 7. Fluids/Electrolytes/Nutrition: Routine I&O's with follow-up chemistries, Resumed oral feedings, speech will work on intake as well   8. Seizure prophylaxis. Keppra now at 1000 mg twice a day---more alert. EEG displayed seizure activity 9. Atrial fibrillation. Cardiac rate control. Continue amiodarone 200 mg daily, Cardizem 60 mg twice a day.Eliquis held secondary to [place]:   07/01/15 0553 07/01/15 1422  BP: 130/65 143/68  Pulse: 87 86  Temp: 97.7 F (36.5 C) 97.6 F (36.4 C)  Resp: 18 18   10. 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 This is likely the etiology of the persistent cough, Right lung nodules,As noted plan follow-up MRI after ICH absorbed for further evaluation of possible metastasis 11. Diabetes mellitus with peripheral neuropathy. Hemoglobin A1c 6.8. Glucophage 1000 mg at breakfast and 500 mg supper , Generally well controlled on the current regimen Amaryl 1 mg daily. Check blood sugars before meals and at bedtime CBG (last 3) generally controlled  Recent Labs  07/01/15 0634 07/01/15 1125 07/01/15 1535  GLUCAP 137* 141* 138*    12. Hyperlipidemia. Lipitor 13. UTI enterobacter cloacae- Sensitive to ceftriaxone. We'll start 1 g every 24 hours.  Afebrile , 7 day course                    LOS (Days) 10 A FACE TO FACE EVALUATION WAS PERFORMED  Lajuane Leatham E 07/01/2015, 4:36 PM

## 2015-07-01 NOTE — Progress Notes (Signed)
Occupational Therapy Session Note  Patient Details  Name: Ivan Beasley MRN: [id number] Date of Birth: 1940/11/09  Today's Date: 07/01/2015 OT Individual Time: 1301-1401 OT Individual Time Calculation (min): 60 min    Short Term Goals: Week 2:  OT Short Term Goal 1 (Week 2): Pt will demonstrate increased awareness to RUE by positioning appropriately to decrease shoulder subluxation OT Short Term Goal 2 (Week 2): Pt will complete squat pivot transfer to toilet with max assist of one caregiver OT Short Term Goal 3 (Week 2): Pt will complete UB dressing with mod assist in unsupported sitting OT Short Term Goal 4 (Week 2): Pt will complete LB dressing with max assist of one caregiver at bed level OT Short Term Goal 5 (Week 2): Pt will complete bathing with mod assist at bed level  Skilled Therapeutic Interventions/Progress Updates:    Treatment session with focus on bed mobility, sitting balance, and trunk control.  Pt in bed upon arrival being cleaned of incontinent BM by nursing staff.  Completed bed mobility with mod-max assist and use of bed rails for rolling while 2nd person completed hygiene and clothing management.  Engaged in sidelying to sitting with focus on proper technique, requiring cues and redirection due to pushing tendencies with LUE.  In sitting pt pushing to Rt with LUE with shoulder hike, requiring tactile cues and encouragement to weight shift back down to Lt elbow to promote Lt trunk shortening and Rt elongation.  Completed scoot/squat pivot transfer to w/c with +2 for assist for body positioning, weight shifting, and safety.  Engaged in trunk control and sitting balance on therapy mat with use of mirror for visual feedback for midline sitting balance and to come back to center with each activity.  Therapist started out seated behind pt then moved to Lt side to further facilitate weight shift to Lt during reaching activity.  Pt required tactile cues and increased wait time to  scan to Rt to match cards to cards on Rt.  Pt attempting to incorporate RUE into matching activity, therefore therapist assisted with total assist for hand over hand to grasp card and place on pile.  Squat/scoot pivot transfer back to w/c with improved head/hips relationship, still requiring +2 assist.  Left semi-reclined in reclining w/c with wife present.  Therapy Documentation Precautions:  Precautions Precautions: Fall Precaution Comments: R hemi, pt on home O2 PTA for recent pneumonia (but used it PRN, not 24/7) Restrictions Weight Bearing Restrictions: No General:   Vital Signs: Therapy Vitals Temp: 97.6 F (36.4 C) Temp Source: Oral Pulse Rate: 86 Resp: 18 BP: (!) 143/68 mmHg Patient Position (if appropriate): Sitting Oxygen Therapy SpO2: 93 % O2 Device: Not Delivered Pain: Pain Assessment Pain Assessment: No/denies pain  See Function Navigator for Current Functional Status.   Therapy/Group: Individual Therapy  [name] 07/01/2015, 3:14 PM

## 2015-07-02 ENCOUNTER — Inpatient Hospital Stay (HOSPITAL_COMMUNITY): Payer: Medicare HMO | Admitting: Physical Therapy

## 2015-07-02 ENCOUNTER — Inpatient Hospital Stay (HOSPITAL_COMMUNITY): Payer: Medicare HMO | Admitting: Speech Pathology

## 2015-07-02 ENCOUNTER — Inpatient Hospital Stay (HOSPITAL_COMMUNITY): Payer: Medicare HMO

## 2015-07-02 ENCOUNTER — Inpatient Hospital Stay (HOSPITAL_COMMUNITY): Payer: Medicare HMO | Admitting: Occupational Therapy

## 2015-07-02 LAB — CBC
HCT: 25.5 % — ABNORMAL LOW (ref 39.0–52.0)
HEMOGLOBIN: 8.1 g/dL — AB (ref 13.0–17.0)
MCH: 27.8 pg (ref 26.0–34.0)
MCHC: 31.8 g/dL (ref 30.0–36.0)
MCV: 87.6 fL (ref 78.0–100.0)
Platelets: 236 10*3/uL (ref 150–400)
RBC: 2.91 MIL/uL — AB (ref 4.22–5.81)
RDW: 17.7 % — ABNORMAL HIGH (ref 11.5–15.5)
WBC: 5.7 10*3/uL (ref 4.0–10.5)

## 2015-07-02 LAB — GLUCOSE, CAPILLARY
GLUCOSE-CAPILLARY: 134 mg/dL — AB (ref 65–99)
GLUCOSE-CAPILLARY: 149 mg/dL — AB (ref 65–99)
GLUCOSE-CAPILLARY: 103 mg/dL — AB (ref 65–99)
GLUCOSE-CAPILLARY: 135 mg/dL — AB (ref 65–99)

## 2015-07-02 LAB — HEPARIN LEVEL (UNFRACTIONATED): HEPARIN UNFRACTIONATED: 0.3 [IU]/mL (ref 0.30–0.70)

## 2015-07-02 MED ORDER — GUAIFENESIN 100 MG/5ML PO SOLN: 5.0000 mL | ORAL | Status: DC | PRN

## 2015-07-02 NOTE — Progress Notes (Signed)
Subjective/Complaints: Eating breakfast with CNA supervision. Coughing quite a bit this morning. Posture poor slumping toward the right side. Review of systems aphasic limiting review Objective: Vital Signs: Blood pressure 122/56, pulse 95, temperature 98.1 F (36.7 C), temperature source Oral, resp. rate 18, weight 93.668 kg (206 lb 8 oz), SpO2 100 %. No results found. Results for orders placed or performed during the hospital encounter of 07/04/2015 (from the past 72 hour(s))  Glucose, capillary     Status: Abnormal   Collection Time: 06/29/15 11:35 AM  Result Value Ref Range   Glucose-Capillary 163 (H) 65 - 99 mg/dL  Glucose, capillary     Status: Abnormal   Collection Time: 06/29/15  4:43 PM  Result Value Ref Range   Glucose-Capillary 194 (H) 65 - 99 mg/dL   Comment 1 Notify RN   Heparin level (unfractionated)     Status: None   Collection Time: 06/29/15  6:25 PM  Result Value Ref Range   Heparin Unfractionated 0.51 0.30 - 0.70 IU/mL    Comment:        IF HEPARIN RESULTS ARE BELOW EXPECTED VALUES, AND PATIENT DOSAGE HAS BEEN CONFIRMED, SUGGEST FOLLOW UP TESTING OF ANTITHROMBIN III LEVELS.   Glucose, capillary     Status: Abnormal   Collection Time: 06/29/15 10:01 PM  Result Value Ref Range   Glucose-Capillary 189 (H) 65 - 99 mg/dL  Glucose, capillary     Status: Abnormal   Collection Time: 06/30/15  1:00 AM  Result Value Ref Range   Glucose-Capillary 141 (H) 65 - 99 mg/dL  Glucose, capillary     Status: Abnormal   Collection Time: 06/30/15  5:02 AM  Result Value Ref Range   Glucose-Capillary 147 (H) 65 - 99 mg/dL  CBC     Status: Abnormal   Collection Time: 06/30/15  7:06 AM  Result Value Ref Range   WBC 4.8 4.0 - 10.5 K/uL   RBC 3.01 (L) 4.22 - 5.81 MIL/uL   Hemoglobin 8.0 (L) 13.0 - 17.0 g/dL   HCT 25.9 (L) 39.0 - 52.0 %   MCV 86.0 78.0 - 100.0 fL   MCH 26.6 26.0 - 34.0 pg   MCHC 30.9 30.0 - 36.0 g/dL   RDW 17.9 (H) 11.5 - 15.5 %   Platelets 231 150 - 400  K/uL  Basic metabolic panel     Status: Abnormal   Collection Time: 06/30/15  7:06 AM  Result Value Ref Range   Sodium 134 (L) 135 - 145 mmol/L   Potassium 4.4 3.5 - 5.1 mmol/L   Chloride 105 101 - 111 mmol/L   CO2 20 (L) 22 - 32 mmol/L   Glucose, Bld 157 (H) 65 - 99 mg/dL   BUN <5 (L) 6 - 20 mg/dL   Creatinine, Ser 1.20 0.61 - 1.24 mg/dL   Calcium 8.8 (L) 8.9 - 10.3 mg/dL   GFR calc non Af Amer 57 (L) >60 mL/min   GFR calc Af Amer >60 >60 mL/min    Comment: (NOTE) The eGFR has been calculated using the CKD EPI equation. This calculation has not been validated in all clinical situations. eGFR's persistently <60 mL/min signify possible Chronic Kidney Disease.    Anion gap 9 5 - 15  Heparin level (unfractionated)     Status: None   Collection Time: 06/30/15  7:06 AM  Result Value Ref Range   Heparin Unfractionated 0.40 0.30 - 0.70 IU/mL    Comment:        IF HEPARIN  RESULTS ARE BELOW EXPECTED VALUES, AND PATIENT DOSAGE HAS BEEN CONFIRMED, SUGGEST FOLLOW UP TESTING OF ANTITHROMBIN III LEVELS.   Glucose, capillary     Status: Abnormal   Collection Time: 06/30/15  7:14 AM  Result Value Ref Range   Glucose-Capillary 169 (H) 65 - 99 mg/dL  Glucose, capillary     Status: Abnormal   Collection Time: 06/30/15  8:04 AM  Result Value Ref Range   Glucose-Capillary 157 (H) 65 - 99 mg/dL  Glucose, capillary     Status: Abnormal   Collection Time: 06/30/15 11:48 AM  Result Value Ref Range   Glucose-Capillary 158 (H) 65 - 99 mg/dL  Glucose, capillary     Status: Abnormal   Collection Time: 06/30/15  4:34 PM  Result Value Ref Range   Glucose-Capillary 113 (H) 65 - 99 mg/dL  Glucose, capillary     Status: Abnormal   Collection Time: 06/30/15  8:11 PM  Result Value Ref Range   Glucose-Capillary 151 (H) 65 - 99 mg/dL  Heparin level (unfractionated)     Status: None   Collection Time: 07/01/15  4:56 AM  Result Value Ref Range   Heparin Unfractionated 0.35 0.30 - 0.70 IU/mL     Comment:        IF HEPARIN RESULTS ARE BELOW EXPECTED VALUES, AND PATIENT DOSAGE HAS BEEN CONFIRMED, SUGGEST FOLLOW UP TESTING OF ANTITHROMBIN III LEVELS.   CBC     Status: Abnormal   Collection Time: 07/01/15  5:01 AM  Result Value Ref Range   WBC 5.0 4.0 - 10.5 K/uL   RBC 3.08 (L) 4.22 - 5.81 MIL/uL   Hemoglobin 8.2 (L) 13.0 - 17.0 g/dL   HCT 27.1 (L) 39.0 - 52.0 %   MCV 88.0 78.0 - 100.0 fL   MCH 26.6 26.0 - 34.0 pg   MCHC 30.3 30.0 - 36.0 g/dL   RDW 17.8 (H) 11.5 - 15.5 %   Platelets 222 150 - 400 K/uL  Basic metabolic panel     Status: Abnormal   Collection Time: 07/01/15  5:01 AM  Result Value Ref Range   Sodium 135 135 - 145 mmol/L   Potassium 4.7 3.5 - 5.1 mmol/L   Chloride 106 101 - 111 mmol/L   CO2 21 (L) 22 - 32 mmol/L   Glucose, Bld 146 (H) 65 - 99 mg/dL   BUN <5 (L) 6 - 20 mg/dL   Creatinine, Ser 1.14 0.61 - 1.24 mg/dL   Calcium 9.3 8.9 - 10.3 mg/dL   GFR calc non Af Amer >60 >60 mL/min   GFR calc Af Amer >60 >60 mL/min    Comment: (NOTE) The eGFR has been calculated using the CKD EPI equation. This calculation has not been validated in all clinical situations. eGFR's persistently <60 mL/min signify possible Chronic Kidney Disease.    Anion gap 8 5 - 15  Glucose, capillary     Status: Abnormal   Collection Time: 07/01/15  6:34 AM  Result Value Ref Range   Glucose-Capillary 137 (H) 65 - 99 mg/dL  Glucose, capillary     Status: Abnormal   Collection Time: 07/01/15 11:25 AM  Result Value Ref Range   Glucose-Capillary 141 (H) 65 - 99 mg/dL   Comment 1 Notify RN   Glucose, capillary     Status: Abnormal   Collection Time: 07/01/15  3:35 PM  Result Value Ref Range   Glucose-Capillary 138 (H) 65 - 99 mg/dL   Comment 1 Notify RN    Comment  2 Document in Chart   Glucose, capillary     Status: Abnormal   Collection Time: 07/01/15  9:52 PM  Result Value Ref Range   Glucose-Capillary 133 (H) 65 - 99 mg/dL  CBC     Status: Abnormal   Collection Time:  07/02/15  6:45 AM  Result Value Ref Range   WBC 5.7 4.0 - 10.5 K/uL   RBC 2.91 (L) 4.22 - 5.81 MIL/uL   Hemoglobin 8.1 (L) 13.0 - 17.0 g/dL   HCT 25.5 (L) 39.0 - 52.0 %   MCV 87.6 78.0 - 100.0 fL   MCH 27.8 26.0 - 34.0 pg   MCHC 31.8 30.0 - 36.0 g/dL   RDW 17.7 (H) 11.5 - 15.5 %   Platelets 236 150 - 400 K/uL  Heparin level (unfractionated)     Status: None   Collection Time: 07/02/15  6:45 AM  Result Value Ref Range   Heparin Unfractionated 0.30 0.30 - 0.70 IU/mL    Comment:        IF HEPARIN RESULTS ARE BELOW EXPECTED VALUES, AND PATIENT DOSAGE HAS BEEN CONFIRMED, SUGGEST FOLLOW UP TESTING OF ANTITHROMBIN III LEVELS.   Glucose, capillary     Status: Abnormal   Collection Time: 07/02/15  6:49 AM  Result Value Ref Range   Glucose-Capillary 134 (H) 65 - 99 mg/dL     HEENT: normal Cardio: irregular irregular Resp: CTA B/L and unlabored GI: BS positive and nontender nondistended Extremity:  No Edema Skin:   Intact Neuro: Flat, Abnormal Sensory cannot assess secondary to aphasia, Abnormal Motor 2 minus finger flexors, biceps and triceps , 0 at deltoid, 2 minus at the hip knee extensors as well as plantar flexors. 0 at the ankle dorsiflexors., Dysarthric and Aphasic, increased facial droop Musc/Skel:  Normal Gen. no acute distress   Assessment/Plan: 1. 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 Physiatrist is providing close team supervision and 24 hour management of active medical problems listed below. Physiatrist and rehab team continue to assess barriers to discharge/monitor patient progress toward functional and medical goals. FIM: Function - Bathing Position: Bed (bed level for LB, sitting EOB for UB) Body parts bathed by patient: Chest, Abdomen Body parts bathed by helper: Right arm, Left arm, Front perineal area, Buttocks,  Right upper leg, Left upper leg, Right lower leg, Left lower leg, Back Bathing not applicable: Right upper leg Assist Level: 2 helpers (Total assist)  Function- Upper Body Dressing/Undressing What is the patient wearing?: Pull over shirt/dress Pull over shirt/dress - Perfomed by patient: Thread/unthread left sleeve Pull over shirt/dress - Perfomed by helper: Thread/unthread right sleeve, Put head through opening, Pull shirt over trunk Assist Level: 2 helpers (Max assist, +2 for sitting balance) Function - Lower Body Dressing/Undressing What is the patient wearing?: Pants, Non-skid slipper socks Position: Bed Pants- Performed by helper: Thread/unthread right pants leg, Thread/unthread left pants leg, Pull pants up/down Non-skid slipper socks- Performed by helper: Don/doff right sock, Don/doff left sock Assist for footwear: Dependant Assist for lower body dressing:  (Total assist)  Function - Toileting Toileting activity did not occur: No continent bowel/bladder event Assist level: Two helpers (per [employer], NT)  Function - Air cabin crew transfer activity did not occur: Safety/medical concerns Assist level to toilet: 2 helpers (per Berkley Harvey, NT) Assist level from toilet: 2 helpers  Function - Chair/bed transfer Chair/bed transfer activity did not  occur: Safety/medical concerns Chair/bed transfer method: Lateral scoot Chair/bed transfer assist level: 2 helpers Chair/bed transfer assistive device: Armrests Mechanical lift: Stedy Chair/bed transfer details: Verbal cues for precautions/safety, Manual facilitation for weight shifting, Manual facilitation for placement, Verbal cues for sequencing, Verbal cues for technique, Tactile cues for placement, Tactile cues for posture, Tactile cues for weight shifting  Function - Locomotion: Wheelchair Will patient use wheelchair at discharge?: Yes Type: Manual Max wheelchair distance: 75 Assist Level: Maximal assistance (Pt 25  - 49%) Assist Level: Maximal assistance (Pt 25 - 49%) Wheel 150 feet activity did not occur: Safety/medical concerns Turns around,maneuvers to table,bed, and toilet,negotiates 3% grade,maneuvers on rugs and over doorsills: No Function - Locomotion: Ambulation Ambulation activity did not occur: Safety/medical concerns Assistive device: Rail in hallway Max distance: 25 Assist level: Maximal assist (Pt 25 - 49%) Assist level: Maximal assist (Pt 25 - 49%)  Function - Comprehension Comprehension: Auditory Comprehension assistive device: Hearing aids Comprehension assist level: Understands basic 75 - 89% of the time/ requires cueing 10 - 24% of the time  Function - Expression Expression: Verbal Expression assist level: Expresses basic 50 - 74% of the time/requires cueing 25 - 49% of the time. Needs to repeat parts of sentences.  Function - Social Interaction Social Interaction assist level: Interacts appropriately 50 - 74% of the time - May be physically or verbally inappropriate.  Function - Problem Solving Problem solving assist level: Solves basic 50 - 74% of the time/requires cueing 25 - 49% of the time  Function - Memory Memory assist level: Recognizes or recalls 50 - 74% of the time/requires cueing 25 - 49% of the time Patient normally able to recall (first 3 days only): Current season    Medical Problem List and Plan: 1.  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 Plan to follow-up MRI of the brain after ICH absorbed for further evaluation of possible metastases posterior left frontal lobe             -we'll continue heparin for now, consider Xarelto on Monday if patient remained stable, monitor for neurologic decline, as per neuro would get stat CT head if this occurs. Cont CIR PT, OT, speech therapy 2.  DVT Prophylaxis/Anticoagulation: SCDs. Monitor for signs of DVT 3. Pain Management: Ultram and oxycodone as needed 4. Mood: Xanax 0.25  mg 3 times daily as needed 5. Neuropsych: This patient is capable of making decisions on his own behalf with caregiver/staff assistance.Emotional lability and poor sleep improving Remeron 6. Skin/Wound Care: Routine skin checks 7. Fluids/Electrolytes/Nutrition: Routine I&O's with follow-up chemistries,severe swallowing difficulties, increased cough. He does have a chronic cough and his positioning may be the issue during his breakfast. Slump toward the right. Will check chest x-ray. At this point I do not think it is a sign of neurologic decline 8. Seizure prophylaxis. Keppra now at 1000 mg twice a day---more alert. EEG displayed seizure activity 9. Atrial fibrillation. Cardiac rate control. Continue amiodarone 200 mg daily, Cardizem 60 mg twice a day.Eliquis held secondary to [place], family does not want to resume, would be comfortable with Xarelto  Filed Vitals:   07/01/15 1422 07/02/15 0507  BP: 143/68 122/56  Pulse: 86 95  Temp: 97.6 F (36.4 C) 98.1 F (36.7 C)  Resp: 18 18   10. 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 This is likely the etiology of the persistent cough, Right lung nodules,As noted plan  follow-up MRI after ICH absorbed for further evaluation of possible metastasis 11. Diabetes mellitus with peripheral neuropathy. Hemoglobin A1c 6.8. Glucophage 1000 mg at breakfast and 500 mg supper , Generally well controlled on the current regimen Amaryl 1 mg daily. Check blood sugars before meals and at bedtime CBG (last 3) generally controlled  Recent Labs  07/01/15 1535 07/01/15 2152 07/02/15 0649  GLUCAP 138* 133* 134*    12. Hyperlipidemia. Lipitor 13. UTI enterobacter cloacae- Sensitive to ceftriaxone. We'll start 1 g every 24 hours.  Afebrile , 7 day course    We'll complete on Monday                LOS (Days) 11 A FACE TO FACE EVALUATION WAS PERFORMED  Bulah Lurie E 07/02/2015, 8:46 AM

## 2015-07-02 NOTE — Progress Notes (Signed)
ANTICOAGULATION CONSULT NOTE - Follow Up Consult  Pharmacy Consult for heparin Indication: atrial fibrillation  No Known Allergies  Patient Measurements: Weight: 206 lb 8 oz (93.668 kg) Heparin Dosing Weight: 93.7 kg  Vital Signs: Temp: 98.1 F (36.7 C) (06/23 0507) Temp Source: Oral (06/23 0507) BP: 122/56 mmHg (06/23 0507) Pulse Rate: 95 (06/23 0507)  Labs:  Recent Labs  06/30/15 0706 07/01/15 0456 07/01/15 0501 07/02/15 0645  HGB 8.0*  --  8.2* 8.1*  HCT 25.9*  --  27.1* 25.5*  PLT 231  --  222 236  HEPARINUNFRC 0.40 0.35  --  0.30  CREATININE 1.20  --  1.14  --     Estimated Creatinine Clearance: 66.5 mL/min (by C-G formula based on Cr of 1.14).   Medications:  Scheduled:  . amiodarone  200 mg Oral Daily  . antiseptic oral rinse  7 mL Mouth Rinse BID  . atorvastatin  40 mg Oral q1800  . cefTRIAXone (ROCEPHIN)  IV  1 g Intravenous Q24H  . dextromethorphan-guaiFENesin  1 tablet Oral BID  . diltiazem  60 mg Oral BID  . glimepiride  1 mg Oral QAC breakfast  . insulin aspart  0-24 Units Subcutaneous TID AC & HS  . lactose free nutrition  237 mL Oral TID WC  . levETIRAcetam  1,000 mg Intravenous Q12H  . metFORMIN  1,000 mg Oral Q breakfast  . metFORMIN  500 mg Oral Q supper  . mirtazapine  7.5 mg Oral QHS  . pantoprazole sodium  40 mg Oral Daily   Infusions:  . dextrose 5 % and 0.45 % NaCl with KCl 20 mEq/L 125 mL/hr at 07/02/15 0502  . heparin 1,350 Units/hr (07/01/15 2114)    Assessment: 75 yo male with afib is currently on therapeutic heparin.  Heparin level is 0.3.  Goal of Therapy:  Heparin level 0.3-0.5 units/ml Monitor platelets by anticoagulation protocol: Yes   Plan:  - Continue heparin at 1350 units/hr - daily heparin level and CBC - watch for signs of bleeding - f/u restarting of DOAC in 1-2 day  Avanthika Dehnert, Tsz-Yin 07/02/2015,8:39 AM

## 2015-07-02 NOTE — Progress Notes (Signed)
Speech Language Pathology Daily Session Note  Patient Details  Name: Ivan Beasley MRN: [id number] Date of Birth: 02-12-1940  Today's Date: 07/02/2015 SLP Individual Time: 1005-1050 SLP Individual Time Calculation (min): 45 min  Short Term Goals: Week 2: SLP Short Term Goal 1 (Week 2): Pt will be intelligible in phrases with mod assist verbal cues for overarticulation, increased vocal intensity,  SLP Short Term Goal 2 (Week 2): Pt will consume Dys 1 textures and nectar thick liquids with min assist verbal cues for use of swallowing precautions and minimal overt s/s of aspiration.  SLP Short Term Goal 3 (Week 2): Pt will consume therapeutic trials of thin liquids via teaspoon with min assist verbal cues for use of swallowing precautions and minimal overt s/s of aspiration.  SLP Short Term Goal 4 (Week 2): Pt will return demonstration of pharyngeal strengthening exercises for 25 repetitions each with min assist verbal cues.   SLP Short Term Goal 5 (Week 2): Pt will sustain his attention to basic tasks for 2-3 minute intervals with min verbal cues for redirection.   SLP Short Term Goal 6 (Week 2): Pt will return demonstration of IMST and RMST devices with supervision verbal cues at 9 cm H2O and 15 cm H2O respectively with effort level rated <7/10 over three consecutive sessions.   Skilled Therapeutic Interventions:  Pt was seen for skilled ST targeting goals for dysphagia. Pt continues to present with congested cough outside of PO intake; discussed results of chest x ray with P.A.  WBC were WFL on most recent labs.  Pt consumed ~3 oz of nectar thick sprite without overt s/s of aspiration.  SLP also evaluated pt for implementation of RMT program to address improved respiratory function for speech and swallowing.  Verbal consent obtained from P.A.  Pt's peak maximum inspiratory pressure was 15 cm H2O which is below lower limit of normal given age (50.75 cm H2O).  Pt's peak maximum expiratory pressure  was 30 cm H2O which is also below lower limit of normal given age (54.75 cm H2O).  As a a result, therapist implemented both inspiratory (IMST) and expiratory (EMST) muscle strength training program with devices.  IMST and EMST devices were set a 9 cm and 15 cm H20 respectively.  Pt able to complete 25 repetitions with devices with a self rated effort level of 5/10.  [name] educated regarding device use.  Pt left in bed with call bell within reach and wife at bedside.  Continue per current plan of care.       Function:  Eating Eating   Modified Consistency Diet: Yes Eating Assist Level: Help managing cup/glass;Supervision or verbal cues           Cognition Comprehension Comprehension assist level: Understands basic 75 - 89% of the time/ requires cueing 10 - 24% of the time  Expression   Expression assist level: Expresses basic 50 - 74% of the time/requires cueing 25 - 49% of the time. Needs to repeat parts of sentences.  Social Interaction Social Interaction assist level: Interacts appropriately 50 - 74% of the time - May be physically or verbally inappropriate.  Problem Solving Problem solving assist level: Solves basic 50 - 74% of the time/requires cueing 25 - 49% of the time  Memory Memory assist level: Recognizes or recalls 50 - 74% of the time/requires cueing 25 - 49% of the time    Pain Pain Assessment Pain Assessment: No/denies pain  Therapy/Group: Individual Therapy  Abrie Egloff, [name] 07/02/2015, 3:46  PM   

## 2015-07-02 NOTE — Patient Care Conference (Signed)
Inpatient RehabilitationTeam Conference and Plan of Care Update Date: 06/30/2015   Time: 11:15 AM    Patient Name: Ivan Beasley      Medical Record Number: [id number]  Date of Birth: October 01, 1940 Sex: Male         Room/Bed: 4W11C/4W11C-01 Payor Info: Payor: [employer] MEDICARE / Plan: [employer] MEDICARE HMO / Product Type: *No Product type* /    Admitting Diagnosis: R-ICH  Admit Date/Time:  06/24/2015  4:45 PM Admission Comments: No comment available   Primary Diagnosis:  ICH (intracerebral hemorrhage) (HCC) Principal Problem: ICH (intracerebral hemorrhage) ([place])  Patient Active Problem List   Diagnosis Date Noted  . Adjustment disorder with mixed anxiety and depressed mood   . Chronic anticoagulation   . Malignant neoplasm of left lung ([place])   . Cerebrovascular accident (CVA) due to embolism of left middle cerebral artery ([place])   . SAH (subarachnoid hemorrhage) ([place]) 06/11/2015  . Coagulopathy ([place]) - eliquis related 07/09/2015  . Brain metastases ([place]), possible 07/01/2015  . Seizures ([place]), possible 06/22/2015  . Asystole ([place]) 07/09/2015  . Hypophosphatemia 06/18/2015  . Hypomagnesemia 07/03/2015  . CKD (chronic kidney disease), stage III 06/23/2015  . Anemia 07/08/2015  . Right hemiparesis ([place]) 06/12/2015  . Cough   . Idiopathic hypotension   . Lung cancer ([place])   . ICH (intracerebral hemorrhage) ([place])- L frontal ICH 06/13/2015  . Paroxysmal atrial fibrillation ([place]) 05/13/2015  . PNA (pneumonia) 05/13/2015  . HCAP (healthcare-associated pneumonia)   . Pneumonitis   . Acute respiratory failure ([place])   . Acute on chronic respiratory failure with hypoxia ([place]) 04/17/2015  . Respiratory failure ([place]) 04/17/2015  . Cancer of lung ([place]) 05/17/2014  . COPD, mild ([place]) 08/29/2013  . Cough, persistent 08/29/2013  . Mild chronic obstructive pulmonary disease ([place]) 08/29/2013  . H/O cardiac catheterization 07/29/2013  . Breath shortness 06/23/2013  . Chest pain 06/23/2013  .  BP (high blood pressure) 06/20/2013  . Diabetes ([place]) 06/20/2013  . HLD (hyperlipidemia) 06/20/2013  . H/O coronary artery bypass surgery 06/20/2013  . Diabetes mellitus ([place]) 06/20/2013  . H/O total knee replacement 06/03/2013    Expected Discharge Date: Expected Discharge Date: 07/15/15  Team Members Present: Physician leading conference: Dr. Alysia Penna Social Worker Present: Alfonse Alpers, LCSW Nurse Present: Dorien Chihuahua, RN PT Present: Kem Parkinson, PT OT Present: [name], OT SLP Present: Windell Moulding, SLP PPS Coordinator present : Daiva Nakayama, RN, CRRN     Current Status/Progress Goal Weekly Team Focus  Medical   Extension of stroke, ischemic, on heparin, plan to transition to oral anticoagulants after 5 days.  Avoid further ischemic strokes but also avoid  recurrent bleed  See above, Also will treat diarrhea   Bowel/Bladder   Incont x2; foley removed 6/20 1800; not able to void at this time, I&O cath; [facility] 6/21  Patient cont with timed toilet  Timed toileting, I&O cath with PVR scan as able   Swallow/Nutrition/ Hydration   Decline in functional status due to new CVA; diet downgraded from regular, thin liquids to dys 1, nectar thick liquids   supervision for least restrictive diet   diet toleration and trials of advanced textures as appropriate    ADL's   had made progress to mod-max assist with transfers and sit > stand, however since new CVA over weekend pt is heavy +2 with all self-care tasks and transfers.  Incontinent of BM impacting participation in treatment sessions  Min assist overall  sitting balance, transfers, trunk control, ADL  retraining, RUE positioning and NMR   Mobility   maxA bed mobility, +2 slideboard transfer, +2 sit <>stand with Stedy. Max sitting balance with posterior lean  minA overall  bed mobility, sitting balance, transfers   Communication   decline in functional status due to new CVA, severe dysarthria and word finding impairment    min assist, downgraded   intelligibility at the phrase/sentence level, word finding for basic information    Safety/Cognition/ Behavioral Observations  decline in functional status due to new CVA, impulsive, decreased sustained attention to tasks, poor awareness   min assist, downgraded   attention to tasks, awareness of deficits, safety awareness    Pain   No complaints of pain   < 2 on 0-10 pain scale  Continue to monitor pain as therapy continues   Skin   MASD to buttocks/peri area.   Resolving at discharge, no further breakdown   Monitor skin qshift     Rehab Goals Patient on target to meet rehab goals: Yes (likely downgraded goals) Rehab Goals Revised: some of pt's goals will need to be downgraded due to pt with new stroke *See Care Plan and progress notes for long and short-term goals.  Barriers to Discharge: Extension of stroke caused increased weakness on the right side and functional levels have fallen from a moderate assistant to a maximum assistance    Possible Resolutions to Barriers:  Continue rehabilitation however may need SNF post discharge    Discharge Planning/Teaching Needs:  Pt plans to go home with his wife who will provide min assist, as needed.  Pt may need more than min assist at d/c and team will help determine if pt's wife can provide that level of care.  [name] is here regularly and will participate in family education closer to d/c.   Team Discussion:  Pt with a stroke over the weekend and lost strength on right side with swallowing and speech were also affected.  Pt will need to follow up later to find the ideology of the bleed.  MD asked team to watch for mental status changes and left side going out, as pt has started heparin and he is at greater risk for further bleed.  Pt is also starting Remeron to help with sleep and emotional lability.  Pt will likely switch to Xarelto after 5 days of heparin is complete.  Pt ate well with ST on D1 diet with nectar  thick liquids.  Will continue to try to progress diet.  Pt is +2 with therapists and pt cannot use stedy right now.  Will try sara.  Pt have some incontinence of soft stool when he coughs.  Revisions to Treatment Plan:  none   Continued Need for Acute Rehabilitation Level of Care: The patient requires daily medical management by a physician with specialized training in physical medicine and rehabilitation for the following conditions: Daily direction of a multidisciplinary physical rehabilitation program to ensure safe treatment while eliciting the highest outcome that is of practical value to the patient.: Yes Daily medical management of patient stability for increased activity during participation in an intensive rehabilitation regime.: Yes Daily analysis of laboratory values and/or radiology reports with any subsequent need for medication adjustment of medical intervention for : Neurological problems;Pulmonary problems  Ronel Rodeheaver, Silvestre Mesi 07/02/2015, 1:26 PM

## 2015-07-02 NOTE — Progress Notes (Signed)
Social Work Patient ID: Ivan Beasley, male   DOB: 08-07-1940, 75 y.o.   MRN: [id number]   CSW met with pt and his wife 06-30-15 to update them on team conference discussion.  Pt frustrated by new stroke, but still smiled and laughed with CSW and is trying to make the best of the situation.  [name] sees the larger picture, but remains hopeful for pt's recovery and is positive in her language.  CSW explained that no changes have been made to his d/c date of 07-15-15 at this time until we see how he progresses, but we will continue to monitor this throughout the week and discuss again at next week's conference.  CSW offered support and will continue to follow and assist as needed.

## 2015-07-02 NOTE — Progress Notes (Signed)
Occupational Therapy Session Note  Patient Details  Name: Ivan Beasley MRN: [id number] Date of Birth: Nov 20, 1940  Today's Date: 07/02/2015 OT Individual Time: 1102-1205 OT Individual Time Calculation (min): 63 min    Short Term Goals: Week 2:  OT Short Term Goal 1 (Week 2): Pt will demonstrate increased awareness to RUE by positioning appropriately to decrease shoulder subluxation OT Short Term Goal 2 (Week 2): Pt will complete squat pivot transfer to toilet with max assist of one caregiver OT Short Term Goal 3 (Week 2): Pt will complete UB dressing with mod assist in unsupported sitting OT Short Term Goal 4 (Week 2): Pt will complete LB dressing with max assist of one caregiver at bed level OT Short Term Goal 5 (Week 2): Pt will complete bathing with mod assist at bed level  Skilled Therapeutic Interventions/Progress Updates:    Treatment session with focus on bed mobility, sitting balance, and trunk control.  Pt in bed upon arrival, engaged in LB dressing at bed level with pt demonstrating increase ability to bridge to attempt to pull pants over Lt hip.  Required assist to roll to Rt and Lt (mod assist) to complete LB dressing.  Engaged in sidelying to sitting with focus on proper technique, pt demonstrating improved carryover and ability to push up to sitting with mod assist.  Slide board transfer bed > w/c with +2 for safety and assist for anterior weight shift and lift off of buttocks.  Engaged in sitting balance on therapy mat with focus on trunk control, weight shifting, and obtaining and maintaining midline sitting balance.  Engaged in leisure activity of "poker" hand with focus on weight shifting and trunk control, utilized mirror to provide visual feedback for midline sitting balance.  Pt brighter affect during familiar, enjoyable task.  Pt required max assist for sitting balance with pt able to maintain for 20-30 seconds without assist but then requiring max-total to correct and  return to upright sitting balance.  Returned to w/c via slide board again with +2, improved anterior weight shift and cues for hand placement to decrease pushing tendencies.  Therapy Documentation Precautions:  Precautions Precautions: Fall Precaution Comments: R hemi, pt on home O2 PTA for recent pneumonia (but used it PRN, not 24/7) Restrictions Weight Bearing Restrictions: No Pain:  Pt with no c/o pain  See Function Navigator for Current Functional Status.   Therapy/Group: Individual Therapy  [name] 07/02/2015, 12:09 PM

## 2015-07-02 NOTE — Progress Notes (Signed)
Physical Therapy Session Note  Patient Details  Name: Ivan Beasley MRN: [id number] Date of Birth: Oct 17, 1940  Today's Date: 07/02/2015 PT Individual Time: 1345-1500 PT Individual Time Calculation (min): 75 min   Short Term Goals: Week 2:  PT Short Term Goal 1 (Week 2): Pt will perform supine<>sit with modA PT Short Term Goal 2 (Week 2): Pt will perform transfer w/c <>bed with maxA +1 PT Short Term Goal 3 (Week 2): Pt will perform sit <>stand maxA PT Short Term Goal 4 (Week 2): Pt will demonstrate sitting balance x3 min with minA PT Short Term Goal 5 (Week 2): Pt will initiate gait training  Skilled Therapeutic Interventions/Progress Updates:    Pt received supine in bed, denies pain and agreeable to treatment. Supine>sit with mod cues for LUE use and technique. Lateral scoot with transfer board x2 bed>w/c, as pt was about to get into w/c therapist noted cushion was wet so pt returned to bed. Seated balance on EOB x5 min while rehab tech retrieved clean cushion. Repetitive L sidelying >sitting to improve bed mobility; modA initially decreased to min guard. Transfer w/c <>mat table lateral scoot +2A with transfer board. Sitting balance on edge of mat table with LUE ball hits. Consistent posterior/L lateral LOBs requiring maxA to recover. Supine soft tissue massage to B upper traps, PROM R rotation and L lateral flexion for pain management and increased ROM/comfort in sitting due to pt tendency towards R sidebending/L lateral rotation. Returned to room in w/c totalA; remained semi-reclined in w/c with wife present and all needs in reach at completion of session.    Therapy Documentation Precautions:  Precautions Precautions: Fall Precaution Comments: R hemi, pt on home O2 PTA for recent pneumonia (but used it PRN, not 24/7) Restrictions Weight Bearing Restrictions: No Pain: Pain Assessment Pain Assessment: No/denies pain   See Function Navigator for Current Functional  Status.   Therapy/Group: Individual Therapy  Luberta Mutter 07/02/2015, 3:31 PM

## 2015-07-03 ENCOUNTER — Inpatient Hospital Stay (HOSPITAL_COMMUNITY): Payer: Medicare HMO | Admitting: Physical Therapy

## 2015-07-03 LAB — CBC
HCT: 25.1 % — ABNORMAL LOW (ref 39.0–52.0)
HEMOGLOBIN: 7.7 g/dL — AB (ref 13.0–17.0)
MCH: 26.5 pg (ref 26.0–34.0)
MCHC: 30.7 g/dL (ref 30.0–36.0)
MCV: 86.3 fL (ref 78.0–100.0)
PLATELETS: 214 10*3/uL (ref 150–400)
RBC: 2.91 MIL/uL — AB (ref 4.22–5.81)
RDW: 17.8 % — ABNORMAL HIGH (ref 11.5–15.5)
WBC: 3.9 10*3/uL — ABNORMAL LOW (ref 4.0–10.5)

## 2015-07-03 LAB — HEPARIN LEVEL (UNFRACTIONATED)
HEPARIN UNFRACTIONATED: 0.28 [IU]/mL — AB (ref 0.30–0.70)
Heparin Unfractionated: 0.68 IU/mL (ref 0.30–0.70)

## 2015-07-03 LAB — GLUCOSE, CAPILLARY
GLUCOSE-CAPILLARY: 149 mg/dL — AB (ref 65–99)
GLUCOSE-CAPILLARY: 118 mg/dL — AB (ref 65–99)
Glucose-Capillary: 151 mg/dL — ABNORMAL HIGH (ref 65–99)
Glucose-Capillary: 131 mg/dL — ABNORMAL HIGH (ref 65–99)

## 2015-07-03 NOTE — Progress Notes (Signed)
Physical Therapy Session Note  Patient Details  Name: Ivan Beasley MRN: [id number] Date of Birth: March 01, 1940  Today's Date: 07/03/2015 PT Individual Time: 0900-0958 PT Individual Time Calculation (min): 58 min   Short Term Goals: Week 1:  PT Short Term Goal 1 (Week 1): Pt will perform bed mobility maxA from flat bed using bedrails PT Short Term Goal 1 - Progress (Week 1): Met PT Short Term Goal 2 (Week 1): Pt will perform squat pivot transfer modA PT Short Term Goal 2 - Progress (Week 1): Not met PT Short Term Goal 3 (Week 1): Pt will initiate gait training with LRAD PT Short Term Goal 3 - Progress (Week 1): Not met PT Short Term Goal 4 (Week 1): Pt will perform w/c propulsion modA with L hemi technique PT Short Term Goal 4 - Progress (Week 1): Not met PT Short Term Goal 5 (Week 1): Pt will demonstrate sitting balance x3 min with minA PT Short Term Goal 5 - Progress (Week 1): Not met Week 2:  PT Short Term Goal 1 (Week 2): Pt will perform supine<>sit with modA PT Short Term Goal 2 (Week 2): Pt will perform transfer w/c <>bed with maxA +1 PT Short Term Goal 3 (Week 2): Pt will perform sit <>stand maxA PT Short Term Goal 4 (Week 2): Pt will demonstrate sitting balance x3 min with minA PT Short Term Goal 5 (Week 2): Pt will initiate gait training Week 3:     Skilled Therapeutic Interventions/Progress Updates:    Patient received supine in bed and noted to have had an incontinent bladder episode. Patient performed Roll R and L x 3 each direction for cleaning from PT and dressing with mod A- max A for multimodal cues for improved LE and UE position. PT instructed patient in L sidelying to sit with mod A from PT instructed patient in SB transfer to Ivan Beasley with +2 max A from bed Patient transported to rehab gym with total A. SB transfer performed to and from mat table with max A +2 with max cues for improved push through BLE and LUE for increased success with scooting.  PT instructed patient in  sitting balance on Edge of mat table for 20 minutes with up to 3 minutes without support from PT. PT provided constant cues for proper UE placement to prevent push to R side and improved trunk position and activation.   Patient returned to room and left in Ivan Beasley with wife present and all needs met.   Therapy Documentation Precautions:  Precautions Precautions: Fall Precaution Comments: R hemi, pt on home O2 PTA for recent pneumonia (but used it PRN, not 24/7) Restrictions Weight Bearing Restrictions: No General:   Vital Signs: Therapy Vitals Temp: 98.2 F (36.8 C) Temp Source: Oral Pulse Rate: 91 Resp: 18 BP: (!) 140/55 mmHg Patient Position (if appropriate): Lying Oxygen Therapy SpO2: 95 % O2 Device: Not Delivered   See Function Navigator for Current Functional Status.   Therapy/Group: Individual Therapy  Ivan Beasley 07/03/2015, 10:02 AM

## 2015-07-03 NOTE — Progress Notes (Signed)
ANTICOAGULATION CONSULT NOTE - Follow Up Consult  Pharmacy Consult for heparin Indication: atrial fibrillation  No Known Allergies  Patient Measurements: Weight: 206 lb 8 oz (93.668 kg) Heparin Dosing Weight: 93.7 kg  Vital Signs: Temp: 97.5 F (36.4 C) (06/24 1428) Temp Source: Oral (06/24 1428) BP: 124/60 mmHg (06/24 1428) Pulse Rate: 95 (06/24 1428)  Labs:  Recent Labs  07/01/15 0501 07/02/15 0645 07/03/15 0420 07/03/15 1758  HGB 8.2* 8.1* 7.7*  --   HCT 27.1* 25.5* 25.1*  --   PLT 222 236 214  --   HEPARINUNFRC  --  0.30 0.68 0.28*  CREATININE 1.14  --   --   --     Estimated Creatinine Clearance: 66.5 mL/min (by C-G formula based on Cr of 1.14).   Medications:  Scheduled:  . amiodarone  200 mg Oral Daily  . antiseptic oral rinse  7 mL Mouth Rinse BID  . atorvastatin  40 mg Oral q1800  . cefTRIAXone (ROCEPHIN)  IV  1 g Intravenous Q24H  . diltiazem  60 mg Oral BID  . glimepiride  1 mg Oral QAC breakfast  . insulin aspart  0-24 Units Subcutaneous TID AC & HS  . lactose free nutrition  237 mL Oral TID WC  . levETIRAcetam  1,000 mg Intravenous Q12H  . metFORMIN  1,000 mg Oral Q breakfast  . metFORMIN  500 mg Oral Q supper  . pantoprazole sodium  40 mg Oral Daily   Infusions:  . dextrose 5 % and 0.45 % NaCl with KCl 20 mEq/L 125 mL/hr at 07/03/15 0435  . heparin 1,350 Units/hr (07/01/15 2114)    Assessment: 75 yo male with with Hx of recent ICH/SAH on heparin for afib. Neuro recommends low intensity HL of 0.3-0.5. HL this evening is sub therapeutic after most recent dose change to 1200 units/hr. No issues with infusion after discussion with RN.  Goal of Therapy:  Heparin level 0.3-0.5 units/ml Monitor platelets by anticoagulation protocol: Yes   Plan:  -Increase heparin to 1250 units/hr -HL and CBC in am -Monitor s/sx of bleeding -f/u restart of DOAC   Vincenza Hews, PharmD, BCPS 07/03/2015, 6:46 PM Pager: [phone]

## 2015-07-03 NOTE — Progress Notes (Signed)
ANTICOAGULATION CONSULT NOTE - Follow Up Consult  Pharmacy Consult for heparin Indication: atrial fibrillation  No Known Allergies  Patient Measurements: Weight: 206 lb 8 oz (93.668 kg) Heparin Dosing Weight: 93.7 kg  Vital Signs: Temp: 98.2 F (36.8 C) (06/24 0648) Temp Source: Oral (06/24 0648) BP: 140/55 mmHg (06/24 0648) Pulse Rate: 91 (06/24 0648)  Labs:  Recent Labs  07/01/15 0456  07/01/15 0501 07/02/15 0645 07/03/15 0420  HGB  --   < > 8.2* 8.1* 7.7*  HCT  --   --  27.1* 25.5* 25.1*  PLT  --   --  222 236 214  HEPARINUNFRC 0.35  --   --  0.30 0.68  CREATININE  --   --  1.14  --   --   < > = values in this interval not displayed.  Estimated Creatinine Clearance: 66.5 mL/min (by C-G formula based on Cr of 1.14).   Medications:  Scheduled:  . amiodarone  200 mg Oral Daily  . antiseptic oral rinse  7 mL Mouth Rinse BID  . atorvastatin  40 mg Oral q1800  . cefTRIAXone (ROCEPHIN)  IV  1 g Intravenous Q24H  . diltiazem  60 mg Oral BID  . glimepiride  1 mg Oral QAC breakfast  . insulin aspart  0-24 Units Subcutaneous TID AC & HS  . lactose free nutrition  237 mL Oral TID WC  . levETIRAcetam  1,000 mg Intravenous Q12H  . metFORMIN  1,000 mg Oral Q breakfast  . metFORMIN  500 mg Oral Q supper  . mirtazapine  7.5 mg Oral QHS  . pantoprazole sodium  40 mg Oral Daily   Infusions:  . dextrose 5 % and 0.45 % NaCl with KCl 20 mEq/L 125 mL/hr at 07/03/15 0435  . heparin 1,350 Units/hr (07/01/15 2114)    Assessment: 75 yo male with with Hx of recent ICH/SAH on heparin for afib. Neuro recommends low intensity HL of 0.3-0.5. HL now SUPRAtherapeutic today at 0.68. Pt had been within therapeutic range for past 4 days on current rate. No BMET since 6/22; unsure if increase due to decreased renal function. CBC relatively stable - H/H 7.7/25.1, plt 214. No overt bleeding noted.   Goal of Therapy:  Heparin level 0.3-0.5 units/ml Monitor platelets by anticoagulation  protocol: Yes   Plan:  -Decrease heparin to 1200 units/hr -Daily HL and CBC -8hr HL -Monitor s/sx of bleeding -f/u restart of DOAC   Judieth Keens 07/03/2015,7:55 AM

## 2015-07-03 NOTE — Progress Notes (Signed)
Subjective/Complaints: Patient appears brighter today wife at bedside,, finished therapy. Wife asking about chest x-ray reviewed results Review of systems aphasic limiting review Objective: Vital Signs: Blood pressure 140/55, pulse 91, temperature 98.2 F (36.8 C), temperature source Oral, resp. rate 18, weight 93.668 kg (206 lb 8 oz), SpO2 95 %. Dg Chest 2 View  07/02/2015  CLINICAL DATA:  Persistent cough, COPD, lung malignancy. Patient cyst has sustained an intracranial hemorrhage with right hemi [place] is EXAM: CHEST  2 VIEW COMPARISON:  Chest x-ray of June 26, 2015 FINDINGS: The right lung is adequately inflated and exhibits increased density in the infrahilar region. There is some overlap here of the Port-A-Cath reservoir. On the left there is persistent increased density in the perihilar region extending toward the lateral pleural surface. The heart is top-normal in size. There are post CABG changes. Three upper sternal wires are chronically broken. There is calcification in the wall of the aortic arch. There is no pleural effusion or pneumothorax. The Port-A-Cath appliance tip projects over the midportion of the SVC. IMPRESSION: 1. Subtle increased density at the right lung base suggests subsegmental atelectasis. Stable parenchymal density in the left mid lung and left perihilar region. 2. Stable post CABG changes.  No evidence of CHF. Electronically Signed   By: David  Martinique M.D.   On: 07/02/2015 09:40   Results for orders placed or performed during the hospital encounter of 06/23/2015 (from the past 72 hour(s))  Glucose, capillary     Status: Abnormal   Collection Time: 06/30/15 11:48 AM  Result Value Ref Range   Glucose-Capillary 158 (H) 65 - 99 mg/dL  Glucose, capillary     Status: Abnormal   Collection Time: 06/30/15  4:34 PM  Result Value Ref Range   Glucose-Capillary 113 (H) 65 - 99 mg/dL  Glucose, capillary     Status: Abnormal   Collection Time: 06/30/15  8:11 PM  Result Value  Ref Range   Glucose-Capillary 151 (H) 65 - 99 mg/dL  Heparin level (unfractionated)     Status: None   Collection Time: 07/01/15  4:56 AM  Result Value Ref Range   Heparin Unfractionated 0.35 0.30 - 0.70 IU/mL    Comment:        IF HEPARIN RESULTS ARE BELOW EXPECTED VALUES, AND PATIENT DOSAGE HAS BEEN CONFIRMED, SUGGEST FOLLOW UP TESTING OF ANTITHROMBIN III LEVELS.   CBC     Status: Abnormal   Collection Time: 07/01/15  5:01 AM  Result Value Ref Range   WBC 5.0 4.0 - 10.5 K/uL   RBC 3.08 (L) 4.22 - 5.81 MIL/uL   Hemoglobin 8.2 (L) 13.0 - 17.0 g/dL   HCT 27.1 (L) 39.0 - 52.0 %   MCV 88.0 78.0 - 100.0 fL   MCH 26.6 26.0 - 34.0 pg   MCHC 30.3 30.0 - 36.0 g/dL   RDW 17.8 (H) 11.5 - 15.5 %   Platelets 222 150 - 400 K/uL  Basic metabolic panel     Status: Abnormal   Collection Time: 07/01/15  5:01 AM  Result Value Ref Range   Sodium 135 135 - 145 mmol/L   Potassium 4.7 3.5 - 5.1 mmol/L   Chloride 106 101 - 111 mmol/L   CO2 21 (L) 22 - 32 mmol/L   Glucose, Bld 146 (H) 65 - 99 mg/dL   BUN <5 (L) 6 - 20 mg/dL   Creatinine, Ser 1.14 0.61 - 1.24 mg/dL   Calcium 9.3 8.9 - 10.3 mg/dL   GFR calc  non Af Amer >60 >60 mL/min   GFR calc Af Amer >60 >60 mL/min    Comment: (NOTE) The eGFR has been calculated using the CKD EPI equation. This calculation has not been validated in all clinical situations. eGFR's persistently <60 mL/min signify possible Chronic Kidney Disease.    Anion gap 8 5 - 15  Glucose, capillary     Status: Abnormal   Collection Time: 07/01/15  6:34 AM  Result Value Ref Range   Glucose-Capillary 137 (H) 65 - 99 mg/dL  Glucose, capillary     Status: Abnormal   Collection Time: 07/01/15 11:25 AM  Result Value Ref Range   Glucose-Capillary 141 (H) 65 - 99 mg/dL   Comment 1 Notify RN   Glucose, capillary     Status: Abnormal   Collection Time: 07/01/15  3:35 PM  Result Value Ref Range   Glucose-Capillary 138 (H) 65 - 99 mg/dL   Comment 1 Notify RN    Comment 2  Document in Chart   Glucose, capillary     Status: Abnormal   Collection Time: 07/01/15  9:52 PM  Result Value Ref Range   Glucose-Capillary 133 (H) 65 - 99 mg/dL  CBC     Status: Abnormal   Collection Time: 07/02/15  6:45 AM  Result Value Ref Range   WBC 5.7 4.0 - 10.5 K/uL   RBC 2.91 (L) 4.22 - 5.81 MIL/uL   Hemoglobin 8.1 (L) 13.0 - 17.0 g/dL   HCT 25.5 (L) 39.0 - 52.0 %   MCV 87.6 78.0 - 100.0 fL   MCH 27.8 26.0 - 34.0 pg   MCHC 31.8 30.0 - 36.0 g/dL   RDW 17.7 (H) 11.5 - 15.5 %   Platelets 236 150 - 400 K/uL  Heparin level (unfractionated)     Status: None   Collection Time: 07/02/15  6:45 AM  Result Value Ref Range   Heparin Unfractionated 0.30 0.30 - 0.70 IU/mL    Comment:        IF HEPARIN RESULTS ARE BELOW EXPECTED VALUES, AND PATIENT DOSAGE HAS BEEN CONFIRMED, SUGGEST FOLLOW UP TESTING OF ANTITHROMBIN III LEVELS.   Glucose, capillary     Status: Abnormal   Collection Time: 07/02/15  6:49 AM  Result Value Ref Range   Glucose-Capillary 134 (H) 65 - 99 mg/dL  Glucose, capillary     Status: Abnormal   Collection Time: 07/02/15 12:06 PM  Result Value Ref Range   Glucose-Capillary 149 (H) 65 - 99 mg/dL  Glucose, capillary     Status: Abnormal   Collection Time: 07/02/15  4:52 PM  Result Value Ref Range   Glucose-Capillary 103 (H) 65 - 99 mg/dL  Glucose, capillary     Status: Abnormal   Collection Time: 07/02/15  9:11 PM  Result Value Ref Range   Glucose-Capillary 135 (H) 65 - 99 mg/dL  CBC     Status: Abnormal   Collection Time: 07/03/15  4:20 AM  Result Value Ref Range   WBC 3.9 (L) 4.0 - 10.5 K/uL   RBC 2.91 (L) 4.22 - 5.81 MIL/uL   Hemoglobin 7.7 (L) 13.0 - 17.0 g/dL   HCT 25.1 (L) 39.0 - 52.0 %   MCV 86.3 78.0 - 100.0 fL   MCH 26.5 26.0 - 34.0 pg   MCHC 30.7 30.0 - 36.0 g/dL   RDW 17.8 (H) 11.5 - 15.5 %   Platelets 214 150 - 400 K/uL  Heparin level (unfractionated)     Status: None   Collection  Time: 07/03/15  4:20 AM  Result Value Ref Range    Heparin Unfractionated 0.68 0.30 - 0.70 IU/mL    Comment:        IF HEPARIN RESULTS ARE BELOW EXPECTED VALUES, AND PATIENT DOSAGE HAS BEEN CONFIRMED, SUGGEST FOLLOW UP TESTING OF ANTITHROMBIN III LEVELS.   Glucose, capillary     Status: Abnormal   Collection Time: 07/03/15  6:58 AM  Result Value Ref Range   Glucose-Capillary 149 (H) 65 - 99 mg/dL     HEENT: normal Cardio: irregular irregular Resp: CTA B/L and unlabored GI: BS positive and nontender nondistended Extremity:  No Edema Skin:   Intact Neuro: Flat, Abnormal Sensory cannot assess secondary to aphasia, Abnormal Motor 2 minus finger flexors, biceps and triceps , 0 at deltoid, 2 minus at the hip knee extensors as well as plantar flexors. 0 at the ankle dorsiflexors., Dysarthric and Aphasic, increased facial droop Musc/Skel:  Normal Gen. no acute distress   Assessment/Plan: 1. 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 Physiatrist is providing close team supervision and 24 hour management of active medical problems listed below. Physiatrist and rehab team continue to assess barriers to discharge/monitor patient progress toward functional and medical goals. FIM: Function - Bathing Position: Bed (bed level for LB, sitting EOB for UB) Body parts bathed by patient: Chest, Abdomen Body parts bathed by helper: Right arm, Left arm, Front perineal area, Buttocks, Right upper leg, Left upper leg, Right lower leg, Left lower leg, Back Bathing not applicable: Right upper leg Assist Level: 2 helpers (Total assist)  Function- Upper Body Dressing/Undressing What is the patient wearing?: Pull over shirt/dress Pull over shirt/dress - Perfomed by patient: Thread/unthread left sleeve Pull over shirt/dress - Perfomed by helper: Thread/unthread right sleeve, Put head through opening, Pull shirt  over trunk Assist Level: 2 helpers (Max assist, +2 for sitting balance) Function - Lower Body Dressing/Undressing What is the patient wearing?: Pants, Non-skid slipper socks Position: Bed Pants- Performed by helper: Thread/unthread right pants leg, Thread/unthread left pants leg, Pull pants up/down Non-skid slipper socks- Performed by helper: Don/doff right sock, Don/doff left sock Assist for footwear: Dependant Assist for lower body dressing:  (Total assist)  Function - Toileting Toileting activity did not occur: No continent bowel/bladder event Assist level: Two helpers (per [employer], NT)  Function - Air cabin crew transfer activity did not occur: Safety/medical concerns Assist level to toilet: 2 helpers (per Berkley Harvey, NT) Assist level from toilet: 2 helpers  Function - Chair/bed transfer Chair/bed transfer activity did not occur: Safety/medical concerns Chair/bed transfer method: Lateral scoot Chair/bed transfer assist level: 2 helpers Chair/bed transfer assistive device: Armrests, Sliding board Mechanical lift: Stedy Chair/bed transfer details: Verbal cues for precautions/safety, Manual facilitation for weight shifting, Manual facilitation for placement, Verbal cues for sequencing, Verbal cues for technique, Tactile cues for placement, Tactile cues for posture, Tactile cues for weight shifting  Function - Locomotion: Wheelchair Will patient use wheelchair at discharge?: Yes Type: Manual Max wheelchair distance: 75 Assist Level: Maximal assistance (Pt 25 - 49%) Assist Level: Maximal assistance (Pt 25 - 49%) Wheel 150 feet activity did not occur: Safety/medical concerns Turns around,maneuvers to table,bed, and toilet,negotiates 3% grade,maneuvers on rugs and over doorsills: No Function - Locomotion: Ambulation Ambulation activity did not occur: Safety/medical concerns Assistive device: Rail in hallway Max distance: 25 Assist level: Maximal assist (Pt 25 -  49%)  Assist level: Maximal assist (Pt 25 - 49%)  Function - Comprehension Comprehension: Auditory Comprehension assistive device: Hearing aids Comprehension assist level: Understands basic 75 - 89% of the time/ requires cueing 10 - 24% of the time  Function - Expression Expression: Verbal Expression assist level: Expresses basic 50 - 74% of the time/requires cueing 25 - 49% of the time. Needs to repeat parts of sentences.  Function - Social Interaction Social Interaction assist level: Interacts appropriately 50 - 74% of the time - May be physically or verbally inappropriate.  Function - Problem Solving Problem solving assist level: Solves basic 50 - 74% of the time/requires cueing 25 - 49% of the time  Function - Memory Memory assist level: Recognizes or recalls 50 - 74% of the time/requires cueing 25 - 49% of the time Patient normally able to recall (first 3 days only): Current season    Medical Problem List and Plan: 1.  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 Plan to follow-up MRI of the brain after ICH absorbed for further evaluation of possible metastases posterior left frontal lobe             -we'll continue heparin for now, consider Xarelto on Monday if patient remained stable, monitor for neurologic decline, as per neuro would get stat CT head if this occurs. Cont CIR PT, OT, speech therapy 2.  DVT Prophylaxis/Anticoagulation: SCDs. Monitor for signs of DVT 3. Pain Management: Ultram and oxycodone as needed 4. Mood: Xanax 0.25 mg 3 times daily as needed 5. Neuropsych: This patient is capable of making decisions on his own behalf with caregiver/staff assistance.Emotional lability and poor sleep improving Remeron wife is concerned about history of not tolerating this medicine and causing some side effects. We will stop for now. It may have been he took a larger dose in the past seemed to tolerate it however will stop and give Xanax at  night for sleep 6. Skin/Wound Care: Routine skin checks 7. Fluids/Electrolytes/Nutrition: Routine I&O's with follow-up chemistries,severe swallowing difficulties, increased cough. He does have a chronic cough and his positioning may be the issue during his breakfast. Slump toward the right. Will check chest x-ray. At this point I do not think it is a sign of neurologic decline 8. Seizure prophylaxis. Keppra now at 1000 mg twice a day---more alert. EEG displayed seizure activity 9. Atrial fibrillation. Cardiac rate control. Continue amiodarone 200 mg daily, Cardizem 60 mg twice a day.Eliquis held secondary to [place], family does not want to resume, would be comfortable with Xarelto  Filed Vitals:   07/02/15 2134 07/03/15 0648  BP: 137/65 140/55  Pulse:  91  Temp:  98.2 F (36.8 C)  Resp:  18   10. 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 This is likely the etiology of the persistent cough, Right lung nodules,As noted plan follow-up MRI after ICH absorbed for further evaluation of possible metastasis 11. Diabetes mellitus with peripheral neuropathy. Hemoglobin A1c 6.8. Glucophage 1000 mg at breakfast and 500 mg supper , Generally well controlled on the current regimen Amaryl 1 mg daily. Check blood sugars before meals and at bedtime CBG (last 3) generally controlled  Recent Labs  07/02/15 1652 07/02/15 2111 07/03/15 0658  GLUCAP 103* 135* 149*    12. Hyperlipidemia. Lipitor 13. UTI enterobacter cloacae- Sensitive to ceftriaxone. We'll start 1 g every 24 hours.  Afebrile , 7 day course    We'll complete on  Monday           4. Anemia we'll check stool guaiac, monitor hemoglobin. No transfusion for now      LOS (Days) 12 A FACE TO FACE EVALUATION WAS PERFORMED  Ivan Beasley E 07/03/2015, 11:02 AM

## 2015-07-04 ENCOUNTER — Inpatient Hospital Stay (HOSPITAL_COMMUNITY): Payer: Medicare HMO

## 2015-07-04 ENCOUNTER — Inpatient Hospital Stay (HOSPITAL_COMMUNITY): Payer: Medicare HMO | Admitting: Physical Therapy

## 2015-07-04 DIAGNOSIS — D62 Acute posthemorrhagic anemia: Secondary | ICD-10-CM

## 2015-07-04 DIAGNOSIS — D649 Anemia, unspecified: Secondary | ICD-10-CM

## 2015-07-04 LAB — CBC
HCT: 26.4 % — ABNORMAL LOW (ref 39.0–52.0)
HEMATOCRIT: 24.3 % — AB (ref 39.0–52.0)
Hemoglobin: 7.3 g/dL — ABNORMAL LOW (ref 13.0–17.0)
Hemoglobin: 8.2 g/dL — ABNORMAL LOW (ref 13.0–17.0)
MCH: 26.2 pg (ref 26.0–34.0)
MCH: 27.2 pg (ref 26.0–34.0)
MCHC: 30 g/dL (ref 30.0–36.0)
MCHC: 31.1 g/dL (ref 30.0–36.0)
MCV: 87.1 fL (ref 78.0–100.0)
MCV: 87.7 fL (ref 78.0–100.0)
PLATELETS: 201 10*3/uL (ref 150–400)
PLATELETS: 199 10*3/uL (ref 150–400)
RBC: 2.79 MIL/uL — ABNORMAL LOW (ref 4.22–5.81)
RBC: 3.01 MIL/uL — ABNORMAL LOW (ref 4.22–5.81)
RDW: 17.9 % — AB (ref 11.5–15.5)
RDW: 17.6 % — ABNORMAL HIGH (ref 11.5–15.5)
WBC: 4.2 10*3/uL (ref 4.0–10.5)
WBC: 4.1 10*3/uL (ref 4.0–10.5)

## 2015-07-04 LAB — IRON AND TIBC
Iron: 26 ug/dL — ABNORMAL LOW (ref 45–182)
Saturation Ratios: 8 % — ABNORMAL LOW (ref 17.9–39.5)
TIBC: 340 ug/dL (ref 250–450)
UIBC: 314 ug/dL

## 2015-07-04 LAB — PREPARE RBC (CROSSMATCH)

## 2015-07-04 LAB — VITAMIN B12: VITAMIN B 12: 267 pg/mL (ref 180–914)

## 2015-07-04 LAB — ABO/RH: ABO/RH(D): O NEG

## 2015-07-04 LAB — HEMOGLOBIN AND HEMATOCRIT, BLOOD
HEMATOCRIT: 28.9 % — AB (ref 39.0–52.0)
HEMOGLOBIN: 9 g/dL — AB (ref 13.0–17.0)

## 2015-07-04 LAB — GLUCOSE, CAPILLARY
GLUCOSE-CAPILLARY: 104 mg/dL — AB (ref 65–99)
GLUCOSE-CAPILLARY: 120 mg/dL — AB (ref 65–99)
Glucose-Capillary: 122 mg/dL — ABNORMAL HIGH (ref 65–99)
Glucose-Capillary: 87 mg/dL (ref 65–99)

## 2015-07-04 LAB — FERRITIN: Ferritin: 21 ng/mL — ABNORMAL LOW (ref 24–336)

## 2015-07-04 LAB — HEPARIN LEVEL (UNFRACTIONATED): Heparin Unfractionated: 0.2 IU/mL — ABNORMAL LOW (ref 0.30–0.70)

## 2015-07-04 LAB — OCCULT BLOOD X 1 CARD TO LAB, STOOL: FECAL OCCULT BLD: NEGATIVE

## 2015-07-04 MED ORDER — SODIUM CHLORIDE 0.9 % IV SOLN
Freq: Once | INTRAVENOUS | Status: AC
  Administered 2015-07-04: 16:00:00 via INTRAVENOUS

## 2015-07-04 NOTE — Progress Notes (Signed)
Physical Therapy Session Note  Patient Details  Name: Ivan Beasley MRN: [id number] Date of Birth: January 01, 1941  Today's Date: 07/04/2015 PT Individual Time: 0901-1001 PT Individual Time Calculation (min): 60 min   Short Term Goals: Week 2:  PT Short Term Goal 1 (Week 2): Pt will perform supine<>sit with modA PT Short Term Goal 2 (Week 2): Pt will perform transfer w/c <>bed with maxA +1 PT Short Term Goal 3 (Week 2): Pt will perform sit <>stand maxA PT Short Term Goal 4 (Week 2): Pt will demonstrate sitting balance x3 min with minA PT Short Term Goal 5 (Week 2): Pt will initiate gait training  Skilled Therapeutic Interventions/Progress Updates:    Pt received in bed & agreeable to PT, denying c/o pain. Pt voiced dislike of R PRAFO boot and PT educated pt on need to continue wearing it at night/while in bed to help prevent contractures & pt agreeable. Pt reported incontinent void & required Mod/max A for rolling L<>R but no incontinence noted. Pt required max A to transfer supine>sit with use of bed features due to pt's poor trunk control & core strength. Pt required max A to achieve neutral sitting balance as pt attempted to push himself posterior/right with LUE. Once pt achieved static sitting balance with maximum cuing for LUE in lap pt able to maintain static sitting balance for a total of 18 minutes with close supervision/steady A. Pt did require frequent verbal cues to look up instead of at floor. Pt did become tearful during session on one occasion when talking about family. At end of session pt required mod a for BLE to transfer sit>supine. PT donned R PRAFO boot & pt left in bed with all needs within reach & wife present.   Therapy Documentation Precautions:  Precautions Precautions: Fall Precaution Comments: R hemi, pt on home O2 PTA for recent pneumonia (but used it PRN, not 24/7) Restrictions Weight Bearing Restrictions: No  Pain: Pain Assessment Pain Assessment: No/denies  pain   See Function Navigator for Current Functional Status.   Therapy/Group: Individual Therapy  Waunita Schooner 07/04/2015, 7:57 AM

## 2015-07-04 NOTE — Consult Note (Signed)
[facility] Consult Note  Ivan Beasley:[id number] DOB: 1940-07-19 DOA: 07/03/2015  Referring physician: DR Lynford Citizen PCP: Albina Billet, MD   Chief Complaint: anemia  HPI: Kindly asked by Dr Read Drivers to evaluate and assist in medical mgmt on Mr.  Ivan Beasley is a 75 y.o. 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  Pt is mainly bedbound currently, full assist.  Wife is at bedside to give me history. Per wife, his mentation is slowly improving, and his RLE strength is improving as well.  She has not noted any melana, hematochezia, hematemesis, dysuria or hematuria.  Per wife, she has been feeding him a lot of carrots. Lately, and his stools looked more orange.  He does have chronic respiratory issues and has a hard time expectorating sputum; he has a suction at bedside.    Patient currently lying comfortably in bed without any complaints, his speech is slurred. Denied pain.    Review of Systems:  No acute complaints, limited ROS given pt's mentation.   Past Medical History  Diagnosis Date  . Coronary artery disease   . Pneumonia 2000  . Arthritis   . 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 Bruises easily   . GERD (gastroesophageal reflux disease)     takes Omeprazole daily as needed for stomach pain  . Blood transfusion 2001  . Diabetes mellitus     takes Metformin daily;  . Impaired hearing     bil hearing aide  . Macular degeneration     being watched for this but hasn't been "completely" diagnosed  .  Myocardial infarction ([place]) 2001  . [facility])    Past Surgical History  Procedure Laterality Date  . 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 Cardiac catheterization  2001  . Tonsillectomy      as a child  . Colonoscopy    . Cataract extraction      bilateral  . Lumbar laminectomy/decompression microdiscectomy  12/19/2010    Procedure: LUMBAR LAMINECTOMY/DECOMPRESSION MICRODISCECTOMY;  Surgeon: Elaina Hoops;  Location: [facility];  Service: Neurosurgery;  Laterality: N/A;  Lumbar three-four, four-five decompressive lumbar laminectomy  . Joint replacement  right tkr  . Eye surgery      cataracts  . Back surgery      l4 5  . Portacath placement N/A 05/20/2014    Procedure: INSERTION PORT-A-CATH;  Surgeon: Nestor Lewandowsky, MD;  Location: [facility];  Service: General;  Laterality: N/A;   Social History:  reports that he quit smoking about 16 years ago. His smoking use included Cigarettes. He quit after 40 years of use. He has never used smokeless tobacco. He reports that he does not drink alcohol or use illicit drugs.  No Known Allergies  Family History  Problem Relation Age of Onset  . Anesthesia problems Neg Hx   . Hypotension Neg Hx   . Malignant hyperthermia Neg Hx   . Pseudochol deficiency Neg Hx   . Diabetes type II Other     Prior to Admission medications   Medication Sig Start Date End Date Taking? Authorizing  Provider  albuterol (PROVENTIL) (2.5 MG/3ML) 0.083% nebulizer solution Take 3 mLs (2.5 mg total) by nebulization every 6 (six) hours as needed for wheezing or shortness of breath. 04/23/15   Demetrios Loll, MD  ALPRAZolam Duanne Moron) 0.25 MG tablet Take 1 tablet (0.25 mg total) by mouth 3 (three) times daily as needed for anxiety. 04/23/15   Demetrios Loll, MD  amiodarone (PACERONE) 200 MG tablet Take 1 tablet (200 mg total) by mouth daily. 04/23/15   Demetrios Loll, MD  amoxicillin-clavulanate (AUGMENTIN) 875-125 MG tablet Take 1 tablet by mouth 2 (two) times daily. 05/31/15    Evlyn Kanner, NP  apixaban (ELIQUIS) 5 MG TABS tablet Take 1 tablet (5 mg total) by mouth 2 (two) times daily. 04/23/15   Demetrios Loll, MD  benzonatate (TESSALON) 100 MG capsule TAKE 1 CAPSULE (100 MG TOTAL) BY MOUTH 3 (THREE) TIMES DAILY AS NEEDED FOR COUGH. 01/12/15   Forest Gleason, MD  bismuth subsalicylate (KAOPECTATE) 262 MG/15ML suspension Take 30 mLs by mouth 3 (three) times daily as needed. 10/28/14   Forest Gleason, MD  Blood Glucose Calibration (TRUETEST CONTROL LEVEL 1) LIQD Use as directed. Reported on 04/17/2015 11/10/13   Historical Provider, MD  diltiazem (CARDIZEM) 60 MG tablet Take 60 mg by mouth 2 (two) times daily. 05/21/15   Historical Provider, MD  diphenoxylate-atropine (LOMOTIL) 2.5-0.025 MG tablet TAKE 1 TABLET EVERY 4 HOURS AS NEEDED FOR DIARRHEA /LOOSE STOOL 04/16/15   Lloyd Huger, MD  glimepiride (AMARYL) 1 MG tablet Take 1 mg by mouth daily.  02/06/14   Historical Provider, MD  glucose blood test strip Use as directed. 11/10/13   Historical Provider, MD  Lancet Devices (TRUEDRAW LANCING DEVICE) MISC Use as directed. 11/10/13   Historical Provider, MD  lidocaine-prilocaine (EMLA) cream Apply 1 application topically See admin instructions. 1 application to site area topically as directed. 05/21/14   Historical Provider, MD  magnesium oxide (MAG-OX) 400 MG tablet Take 400 mg by mouth daily.      Historical Provider, MD  Melatonin 5 MG CAPS Take 1 capsule (5 mg total) by mouth at bedtime as needed. 06/03/14   Forest Gleason, MD  metFORMIN (GLUCOPHAGE) 500 MG tablet Take 500-1,000 mg by mouth See admin instructions. Take 1000 mg by mouth in the morning,and take 500 mg by mouth every night at bedtime.    Historical Provider, MD  omeprazole (PRILOSEC) 40 MG capsule Take 40 mg by mouth daily.     Historical Provider, MD  oxyCODONE (ROXICODONE) 5 MG immediate release tablet Take 1 tablet (5 mg total) by mouth every 6 (six) hours as needed for severe pain. 10/14/14   Forest Gleason, MD    predniSONE (DELTASONE) 10 MG tablet Take 6 tablets (60 mg total) by mouth daily with breakfast. Taper by '10mg'$  every day until complete 05/31/15   Evlyn Kanner, NP  SYMBICORT 160-4.5 MCG/ACT inhaler Inhale 2 puffs into the lungs 2 (two) times daily.  04/21/14   Historical Provider, MD  tiotropium (SPIRIVA) 18 MCG inhalation capsule Place 1 capsule (18 mcg total) into inhaler and inhale daily. 04/23/15   Demetrios Loll, MD  traMADol (ULTRAM) 50 MG tablet Take 50 mg by mouth every 6 (six) hours as needed for moderate pain or severe pain.  03/30/15   Historical Provider, MD   Physical Exam: Filed Vitals:   07/03/15 9563 07/03/15 1428 07/03/15 2103 07/04/15 0611  BP: 140/55 124/60 143/64 115/49  Pulse: 91 95  86  Temp: 98.2 F (36.8 C) 97.5  F (36.4 C)  99.3 F (37.4 C)  TempSrc: Oral Oral  Oral  Resp: '18 18  18  '$ Weight:      SpO2: 95% 99%  95%    Wt Readings from Last 3 Encounters:  06/27/15 93.668 kg (206 lb 8 oz)  06/13/15 94.5 kg (208 lb 5.4 oz)  06/13/15 97.1 kg (214 lb 1.1 oz)    General:  Appears calm and comfortable, pleasant, NAD, alert to self/wife, slurred speech, mainly dysarthric/aphasic. Eyes: PERRL, normal lids, irises & conjunctiva ENT: grossly normal hearing, lips & tongue, Dry mmm Neck: no LAD Cardiovascular: irreg irreg. No m/g/r.  No LE edema. Respiratory: diminished diffusely, no w/cNormal respiratory effort. Abdomen: soft, ntnd +rectal exam: good tone; hemoccult grossly w/ Orange tinged stool. Skin: no rash or induration seen on limited exam Musculoskeletal: grossly normal tone BUE/BLE Psychiatric: dysarthric, aphasic Neurologic: grossly non-focal.          Labs on Admission:  Basic Metabolic Panel:  Recent Labs Lab 06/29/15 0530 06/30/15 0706 07/01/15 0501  NA 135 134* 135  K 3.8 4.4 4.7  CL 104 105 106  CO2 22 20* 21*  GLUCOSE 173* 157* 146*  BUN <5* <5* <5*  CREATININE 1.09 1.20 1.14  CALCIUM 9.1 8.8* 9.3   Liver Function Tests: No results  for input(s): AST, ALT, ALKPHOS, BILITOT, PROT, ALBUMIN in the last 168 hours. No results for input(s): LIPASE, AMYLASE in the last 168 hours. No results for input(s): AMMONIA in the last 168 hours. CBC:  Recent Labs Lab 06/30/15 0706 07/01/15 0501 07/02/15 0645 07/03/15 0420 07/04/15 0504  WBC 4.8 5.0 5.7 3.9* 4.2  HGB 8.0* 8.2* 8.1* 7.7* 7.3*  HCT 25.9* 27.1* 25.5* 25.1* 24.3*  MCV 86.0 88.0 87.6 86.3 87.1  PLT 231 222 236 214 201   Cardiac Enzymes: No results for input(s): CKTOTAL, CKMB, CKMBINDEX, TROPONINI in the last 168 hours.  BNP (last 3 results) No results for input(s): BNP in the last 8760 hours.  ProBNP (last 3 results) No results for input(s): PROBNP in the last 8760 hours.  CBG:  Recent Labs Lab 07/03/15 0658 07/03/15 1129 07/03/15 1647 07/03/15 2121 07/04/15 0635  GLUCAP 149* 151* 118* 131* 122*    Radiological Exams on Admission: No results found.  EKG:  06/14/15 ekg / sb 50 bpm  FOBT = Pending / sent to labs.   06/14/15 eeg; This EEG is consistent with a mild generalized non-specific cerebral dysfunction(encephalopathy). This can be seen with sedative medications among other causes. There was no seizure or seizure predisposition recorded on this study. Please note that a normal EEG does not preclude the possibility of epilepsy.    Assessment/Plan Principal Problem:   ICH (intracerebral hemorrhage) ([place])- L frontal ICH Active Problems:   Mild chronic obstructive pulmonary disease (HCC)   Seizures (HCC), possible   CKD (chronic kidney disease), stage III   Anemia   Right hemiparesis (HCC)   Cerebrovascular accident (CVA) due to embolism of left middle cerebral artery (HCC)   Chronic anticoagulation   Malignant neoplasm of left lung (HCC)   Adjustment disorder with mixed anxiety and depressed mood   1. Normocytic Anemia - source unclear., downtrending steadily per lab review last few days. ?iatrogenic from frequent blood draws, vs chronic  disease/other deficiencies such as iron/b12/folate - Patient is neurologically intact,  - CT  abdomen and pelvis is pending to rule out retroperitoneal bleed -  guaiaced  grossly normal. Fecal occult blood testing was sent to lab and  is pending at this time - orthostatic negative currently. - Type and screen the patient - would recd holding Heparin drip in light of concerns for bleed. - check iron studies pre- blood trx. - given pt's CAD hx, would recd transfusion 1p rbc, for goal hb >8. - check post transfusion h/h.  2.  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  - defer to rehab  3. Atrial fib. - sounds PAF., reg rate on my exam.  - Will get ekg. - recd holding hep gtt at this time until r/o cause of anemia.  - CHADsVASc score at least 6, very high risk for recurrent strokes, will need to weigh risk/benefits of resuming anticoagulation after r/o bleeding risk.  4. htn - stable,   5. Dm 2 w/ neuropathy - controlled  6. Cad, sp cabg - in light of cardiac hx, will trx for Hb >8 at this time  7.  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 Discussed w/ Dr. Read Drivers.  On behalf of [facility], we appreciate consult and will be following the patient closely with you. Thank you  Code Status: Full DVT Prophylaxis: scds Family Communication:   dw pt and wife at bedside Disposition Plan: defer to rehab  Time spent: 62mns   DMaren ReamerMD., MBA/MHA [facility] Pager [phone]

## 2015-07-04 NOTE — Progress Notes (Signed)
ANTICOAGULATION CONSULT NOTE - Follow Up Consult  Pharmacy Consult for heparin Indication: atrial fibrillation  No Known Allergies  Patient Measurements: Weight: 206 lb 8 oz (93.668 kg) Heparin Dosing Weight: 93.7 kg  Vital Signs: Temp: 99.3 F (37.4 C) (06/25 0611) Temp Source: Oral (06/25 0611) BP: 115/49 mmHg (06/25 0611) Pulse Rate: 86 (06/25 0611)  Labs:  Recent Labs  07/02/15 0645 07/03/15 0420 07/03/15 1758 07/04/15 0504  HGB 8.1* 7.7*  --  7.3*  HCT 25.5* 25.1*  --  24.3*  PLT 236 214  --  201  HEPARINUNFRC 0.30 0.68 0.28* 0.20*    Estimated Creatinine Clearance: 66.5 mL/min (by C-G formula based on Cr of 1.14).   Medications:  Scheduled:  . amiodarone  200 mg Oral Daily  . antiseptic oral rinse  7 mL Mouth Rinse BID  . atorvastatin  40 mg Oral q1800  . cefTRIAXone (ROCEPHIN)  IV  1 g Intravenous Q24H  . diltiazem  60 mg Oral BID  . glimepiride  1 mg Oral QAC breakfast  . insulin aspart  0-24 Units Subcutaneous TID AC & HS  . lactose free nutrition  237 mL Oral TID WC  . levETIRAcetam  1,000 mg Intravenous Q12H  . metFORMIN  1,000 mg Oral Q breakfast  . metFORMIN  500 mg Oral Q supper  . pantoprazole sodium  40 mg Oral Daily   Infusions:  . dextrose 5 % and 0.45 % NaCl with KCl 20 mEq/L 125 mL/hr at 07/04/15 0629  . heparin 1,250 Units/hr (07/04/15 0623)    Assessment: 75 yo male with with Hx of recent ICH/SAH on heparin for afib. Neuro recommends low intensity HL of 0.3-0.5. HL remains SUBtherapeutic today at 0.20 after rate increase overnight. CBC trending down - Hgb 8.1>7.7>7.3; Plt 236>214>201. No overt bleeding noted.    Goal of Therapy:  Heparin level 0.3-0.5 units/ml Monitor platelets by anticoagulation protocol: Yes   Plan:  -Increase heparin to 1350 units/hr -8hr HL -Daily HL and CBC -Monitor s/sx of bleeding -f/u restart of Tira, PharmD Clinical Pharmacy Resident 7:28 AM, 07/04/2015

## 2015-07-04 NOTE — Progress Notes (Addendum)
Subjective/Complaints: Wife feels like facial droop is improving No abdominal pain, nausea, vomiting. Tolerating by mouth with modified diet Review of systems aphasic limiting review Objective: Vital Signs: Blood pressure 115/49, pulse 86, temperature 99.3 F (37.4 C), temperature source Oral, resp. rate 18, weight 93.668 kg (206 lb 8 oz), SpO2 95 %. No results found. Results for orders placed or performed during the hospital encounter of 07/01/2015 (from the past 72 hour(s))  Glucose, capillary     Status: Abnormal   Collection Time: 07/01/15 11:25 AM  Result Value Ref Range   Glucose-Capillary 141 (H) 65 - 99 mg/dL   Comment 1 Notify RN   Glucose, capillary     Status: Abnormal   Collection Time: 07/01/15  3:35 PM  Result Value Ref Range   Glucose-Capillary 138 (H) 65 - 99 mg/dL   Comment 1 Notify RN    Comment 2 Document in Chart   Glucose, capillary     Status: Abnormal   Collection Time: 07/01/15  9:52 PM  Result Value Ref Range   Glucose-Capillary 133 (H) 65 - 99 mg/dL  CBC     Status: Abnormal   Collection Time: 07/02/15  6:45 AM  Result Value Ref Range   WBC 5.7 4.0 - 10.5 K/uL   RBC 2.91 (L) 4.22 - 5.81 MIL/uL   Hemoglobin 8.1 (L) 13.0 - 17.0 g/dL   HCT 25.5 (L) 39.0 - 52.0 %   MCV 87.6 78.0 - 100.0 fL   MCH 27.8 26.0 - 34.0 pg   MCHC 31.8 30.0 - 36.0 g/dL   RDW 17.7 (H) 11.5 - 15.5 %   Platelets 236 150 - 400 K/uL  Heparin level (unfractionated)     Status: None   Collection Time: 07/02/15  6:45 AM  Result Value Ref Range   Heparin Unfractionated 0.30 0.30 - 0.70 IU/mL    Comment:        IF HEPARIN RESULTS ARE BELOW EXPECTED VALUES, AND PATIENT DOSAGE HAS BEEN CONFIRMED, SUGGEST FOLLOW UP TESTING OF ANTITHROMBIN III LEVELS.   Glucose, capillary     Status: Abnormal   Collection Time: 07/02/15  6:49 AM  Result Value Ref Range   Glucose-Capillary 134 (H) 65 - 99 mg/dL  Glucose, capillary     Status: Abnormal   Collection Time: 07/02/15 12:06 PM  Result  Value Ref Range   Glucose-Capillary 149 (H) 65 - 99 mg/dL  Glucose, capillary     Status: Abnormal   Collection Time: 07/02/15  4:52 PM  Result Value Ref Range   Glucose-Capillary 103 (H) 65 - 99 mg/dL  Glucose, capillary     Status: Abnormal   Collection Time: 07/02/15  9:11 PM  Result Value Ref Range   Glucose-Capillary 135 (H) 65 - 99 mg/dL  CBC     Status: Abnormal   Collection Time: 07/03/15  4:20 AM  Result Value Ref Range   WBC 3.9 (L) 4.0 - 10.5 K/uL   RBC 2.91 (L) 4.22 - 5.81 MIL/uL   Hemoglobin 7.7 (L) 13.0 - 17.0 g/dL   HCT 25.1 (L) 39.0 - 52.0 %   MCV 86.3 78.0 - 100.0 fL   MCH 26.5 26.0 - 34.0 pg   MCHC 30.7 30.0 - 36.0 g/dL   RDW 17.8 (H) 11.5 - 15.5 %   Platelets 214 150 - 400 K/uL  Heparin level (unfractionated)     Status: None   Collection Time: 07/03/15  4:20 AM  Result Value Ref Range   Heparin Unfractionated  0.68 0.30 - 0.70 IU/mL    Comment:        IF HEPARIN RESULTS ARE BELOW EXPECTED VALUES, AND PATIENT DOSAGE HAS BEEN CONFIRMED, SUGGEST FOLLOW UP TESTING OF ANTITHROMBIN III LEVELS.   Glucose, capillary     Status: Abnormal   Collection Time: 07/03/15  6:58 AM  Result Value Ref Range   Glucose-Capillary 149 (H) 65 - 99 mg/dL  Glucose, capillary     Status: Abnormal   Collection Time: 07/03/15 11:29 AM  Result Value Ref Range   Glucose-Capillary 151 (H) 65 - 99 mg/dL  Glucose, capillary     Status: Abnormal   Collection Time: 07/03/15  4:47 PM  Result Value Ref Range   Glucose-Capillary 118 (H) 65 - 99 mg/dL  Heparin level (unfractionated)     Status: Abnormal   Collection Time: 07/03/15  5:58 PM  Result Value Ref Range   Heparin Unfractionated 0.28 (L) 0.30 - 0.70 IU/mL    Comment:        IF HEPARIN RESULTS ARE BELOW EXPECTED VALUES, AND PATIENT DOSAGE HAS BEEN CONFIRMED, SUGGEST FOLLOW UP TESTING OF ANTITHROMBIN III LEVELS.   Glucose, capillary     Status: Abnormal   Collection Time: 07/03/15  9:21 PM  Result Value Ref Range    Glucose-Capillary 131 (H) 65 - 99 mg/dL  CBC     Status: Abnormal   Collection Time: 07/04/15  5:04 AM  Result Value Ref Range   WBC 4.2 4.0 - 10.5 K/uL   RBC 2.79 (L) 4.22 - 5.81 MIL/uL   Hemoglobin 7.3 (L) 13.0 - 17.0 g/dL   HCT 24.3 (L) 39.0 - 52.0 %   MCV 87.1 78.0 - 100.0 fL   MCH 26.2 26.0 - 34.0 pg   MCHC 30.0 30.0 - 36.0 g/dL   RDW 17.9 (H) 11.5 - 15.5 %   Platelets 201 150 - 400 K/uL  Heparin level (unfractionated)     Status: Abnormal   Collection Time: 07/04/15  5:04 AM  Result Value Ref Range   Heparin Unfractionated 0.20 (L) 0.30 - 0.70 IU/mL    Comment:        IF HEPARIN RESULTS ARE BELOW EXPECTED VALUES, AND PATIENT DOSAGE HAS BEEN CONFIRMED, SUGGEST FOLLOW UP TESTING OF ANTITHROMBIN III LEVELS.   Glucose, capillary     Status: Abnormal   Collection Time: 07/04/15  6:35 AM  Result Value Ref Range   Glucose-Capillary 122 (H) 65 - 99 mg/dL     HEENT: normal Cardio: irregular irregular Resp: CTA B/L and unlabored GI: BS positive and nontender nondistended Extremity:  No Edema Skin:   Intact Neuro: Flat, Abnormal Sensory cannot assess secondary to aphasia, Abnormal Motor 2 minus finger flexors, biceps and triceps , 0 at deltoid, 3 minus at the hip knee extensors as well as plantar flexors. 0 at the ankle dorsiflexors., Dysarthric and Aphasic, increased facial droop Musc/Skel:  Normal Gen. no acute distress   Assessment/Plan: 1. 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 Physiatrist is providing close team supervision and 24 hour management of active medical problems listed below. Physiatrist and rehab team continue to assess barriers to discharge/monitor patient progress toward functional and medical goals. FIM: Function - Bathing Position: Bed (bed level for LB, sitting EOB for UB) Body parts bathed by patient:  Chest, Abdomen Body parts bathed by helper: Right arm, Left arm, Front perineal area,  Buttocks, Right upper leg, Left upper leg, Right lower leg, Left lower leg, Back Bathing not applicable: Right upper leg Assist Level: 2 helpers (Total assist)  Function- Upper Body Dressing/Undressing What is the patient wearing?: Pull over shirt/dress Pull over shirt/dress - Perfomed by patient: Thread/unthread left sleeve Pull over shirt/dress - Perfomed by helper: Thread/unthread right sleeve, Put head through opening, Pull shirt over trunk Assist Level: 2 helpers (Max assist, +2 for sitting balance) Function - Lower Body Dressing/Undressing What is the patient wearing?: Pants, Non-skid slipper socks Position: Bed Pants- Performed by helper: Thread/unthread right pants leg, Thread/unthread left pants leg, Pull pants up/down Non-skid slipper socks- Performed by helper: Don/doff right sock, Don/doff left sock Assist for footwear: Dependant Assist for lower body dressing:  (Total assist)  Function - Toileting Toileting activity did not occur: No continent bowel/bladder event Assist level: Two helpers (per [employer], NT)  Function - Air cabin crew transfer activity did not occur: Safety/medical concerns Assist level to toilet: 2 helpers (per [place], NT) Assist level from toilet: 2 helpers  Function - Chair/bed transfer Chair/bed transfer activity did not occur: Safety/medical concerns Chair/bed transfer method: Lateral scoot Chair/bed transfer assist level: 2 helpers Chair/bed transfer assistive device: Armrests, Sliding board Mechanical lift: Stedy Chair/bed transfer details: Verbal cues for precautions/safety, Manual facilitation for weight shifting, Manual facilitation for placement, Verbal cues for sequencing, Verbal cues for technique, Tactile cues for placement, Tactile cues for posture, Tactile cues for weight shifting  Function - Locomotion: Wheelchair Will patient use  wheelchair at discharge?: Yes Type: Manual Max wheelchair distance: 75 Assist Level: Maximal assistance (Pt 25 - 49%) Assist Level: Maximal assistance (Pt 25 - 49%) Wheel 150 feet activity did not occur: Safety/medical concerns Turns around,maneuvers to table,bed, and toilet,negotiates 3% grade,maneuvers on rugs and over doorsills: No Function - Locomotion: Ambulation Ambulation activity did not occur: Safety/medical concerns Assistive device: Rail in hallway Max distance: 25 Assist level: Maximal assist (Pt 25 - 49%) Assist level: Maximal assist (Pt 25 - 49%)  Function - Comprehension Comprehension: Auditory Comprehension assistive device: Hearing aids Comprehension assist level: Understands basic 75 - 89% of the time/ requires cueing 10 - 24% of the time  Function - Expression Expression: Verbal Expression assist level: Expresses basic 50 - 74% of the time/requires cueing 25 - 49% of the time. Needs to repeat parts of sentences.  Function - Social Interaction Social Interaction assist level: Interacts appropriately 50 - 74% of the time - May be physically or verbally inappropriate.  Function - Problem Solving Problem solving assist level: Solves basic 50 - 74% of the time/requires cueing 25 - 49% of the time  Function - Memory Memory assist level: Recognizes or recalls 50 - 74% of the time/requires cueing 25 - 49% of the time Patient normally able to recall (first 3 days only): Current season    Medical Problem List and Plan: 1.  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 Plan to follow-up MRI of the brain after ICH absorbed for further evaluation of possible metastases posterior left frontal lobe             -we'll discontinue heparin for now,  Evaluate source of blood loss,stat CT abdomen and pelvis, stool guaiac, do not think it is a rebleed into the brain given note change of neurologic status in fact he looks better in terms of lower  extremity strength I discussed the pros and cons of discontinuing  the heparin.this includes  an ischemic stroke. I will also consult [facility]ists If no obvious source is identified, may consider transfusion with restarting heparin  Will ask hospitalist to weigh in on this as well. 2.  DVT Prophylaxis/Anticoagulation: SCDs. Monitor for signs of DVT 3. Pain Management: Ultram and oxycodone as needed 4. Mood: Xanax 0.25 mg 3 times daily as needed 5. Neuropsych: This patient is not capable of making decisions on his own behalf with caregiver/staff assistance. 6. Skin/Wound Care: Routine skin checks 7. Fluids/Electrolytes/Nutrition: Routine I&O's with follow-up chemistries,severe swallowing difficulties, increased cough.  At this point I do not think it is a sign of neurologic decline 8. Seizure prophylaxis. Keppra now at 1000 mg twice a day---more alert. EEG displayed seizure activity 9. Atrial fibrillation. Cardiac rate control. Continue amiodarone 200 mg daily, Cardizem 60 mg twice a day.Eliquis held secondary to [place], family does not want to resume, would be comfortable with Xarelto , will not start until cause of anemia is identified Filed Vitals:   07/03/15 2103 07/04/15 0611  BP: 143/64 115/49  Pulse:  86  Temp:  99.3 F (37.4 C)  Resp:  18   10. 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 This is likely the etiology of the persistent cough, Right lung nodules,As noted plan follow-up MRI after ICH absorbed for further evaluation of possible metastasis 11. Diabetes mellitus with peripheral neuropathy. Hemoglobin A1c 6.8. Glucophage 1000 mg at breakfast and 500 mg supper , Generally well controlled on the current regimen Amaryl 1 mg daily. Check blood sugars before meals and at bedtime CBG (last 3) generally controlled  Recent Labs  07/03/15 1647 07/03/15 2121 07/04/15 0635  GLUCAP 118* 131* 122*    12.  Hyperlipidemia. Lipitor 13. UTI enterobacter cloacae- Sensitive to ceftriaxone. We'll start 1 g every 24 hours.  Afebrile , 7 day course    We'll complete on Monday           4. Anemia we'll check stool guaiac, monitor hemoglobin. No transfusion for now, no obvious cause but will check CT abd and pelvis to r/o retroperitoneal hemmorhage or other GI blood loss.      LOS (Days) 13 A FACE TO FACE EVALUATION WAS PERFORMED  KIRSTEINS,ANDREW E 07/04/2015, 11:08 AM

## 2015-07-05 ENCOUNTER — Inpatient Hospital Stay (HOSPITAL_COMMUNITY): Payer: Medicare HMO | Admitting: Physical Therapy

## 2015-07-05 ENCOUNTER — Inpatient Hospital Stay (HOSPITAL_COMMUNITY): Payer: Medicare HMO | Admitting: Occupational Therapy

## 2015-07-05 ENCOUNTER — Inpatient Hospital Stay (HOSPITAL_COMMUNITY): Payer: Medicare HMO | Admitting: Speech Pathology

## 2015-07-05 DIAGNOSIS — D508 Other iron deficiency anemias: Secondary | ICD-10-CM

## 2015-07-05 LAB — BASIC METABOLIC PANEL
Anion gap: 7 (ref 5–15)
CO2: 22 mmol/L (ref 22–32)
CREATININE: 1.14 mg/dL (ref 0.61–1.24)
Calcium: 9.6 mg/dL (ref 8.9–10.3)
Chloride: 105 mmol/L (ref 101–111)
GFR calc Af Amer: >60 mL/min (ref >60)
Glucose, Bld: 128 mg/dL — ABNORMAL HIGH (ref 65–99)
POTASSIUM: 4.5 mmol/L (ref 3.5–5.1)
SODIUM: 134 mmol/L — AB (ref 135–145)

## 2015-07-05 LAB — TYPE AND SCREEN
ABO/RH(D): O NEG
ANTIBODY SCREEN: NEGATIVE
Unit division: 0

## 2015-07-05 LAB — CBC WITH DIFFERENTIAL/PLATELET
BASOS PCT: 0 %
Basophils Absolute: 0 10*3/uL (ref 0.0–0.1)
EOS ABS: 0.2 10*3/uL (ref 0.0–0.7)
EOS PCT: 4 %
HCT: 28.9 % — ABNORMAL LOW (ref 39.0–52.0)
Hemoglobin: 8.9 g/dL — ABNORMAL LOW (ref 13.0–17.0)
LYMPHS ABS: 0.7 10*3/uL (ref 0.7–4.0)
Lymphocytes Relative: 13 %
MCH: 26.3 pg (ref 26.0–34.0)
MCHC: 30.8 g/dL (ref 30.0–36.0)
MCV: 85.5 fL (ref 78.0–100.0)
Monocytes Absolute: 0.7 10*3/uL (ref 0.1–1.0)
Monocytes Relative: 12 %
Neutro Abs: 3.8 10*3/uL (ref 1.7–7.7)
Neutrophils Relative %: 71 %
PLATELETS: 193 10*3/uL (ref 150–400)
RBC: 3.38 MIL/uL — AB (ref 4.22–5.81)
RDW: 17.8 % — ABNORMAL HIGH (ref 11.5–15.5)
WBC: 5.4 10*3/uL (ref 4.0–10.5)

## 2015-07-05 LAB — FOLATE RBC
FOLATE, HEMOLYSATE: 273.8 ng/mL
Folate, RBC: 1136 ng/mL (ref >498)
HEMATOCRIT: 24.1 % — AB (ref 37.5–51.0)

## 2015-07-05 LAB — GLUCOSE, CAPILLARY
GLUCOSE-CAPILLARY: 120 mg/dL — AB (ref 65–99)
Glucose-Capillary: 120 mg/dL — ABNORMAL HIGH (ref 65–99)
Glucose-Capillary: 146 mg/dL — ABNORMAL HIGH (ref 65–99)
Glucose-Capillary: 132 mg/dL — ABNORMAL HIGH (ref 65–99)

## 2015-07-05 MED ORDER — HEPARIN SODIUM (PORCINE) 5000 UNIT/ML IJ SOLN
5000.0000 [IU] | Freq: Three times a day (TID) | INTRAMUSCULAR | Status: DC
  Administered 2015-07-05 – 2015-07-06 (×3): 5000 [IU] via SUBCUTANEOUS
  Filled 2015-07-05 (×3): qty 1

## 2015-07-05 MED ORDER — VITAMIN B-12 1000 MCG PO TABS
1000.0000 ug | ORAL_TABLET | Freq: Every day | ORAL | Status: DC
  Administered 2015-07-05 – 2015-07-13 (×9): 1000 ug via ORAL
  Filled 2015-07-05 (×10): qty 1

## 2015-07-05 NOTE — Progress Notes (Signed)
Occupational Therapy Session Note  Patient Details  Name: Ivan Beasley MRN: [id number] Date of Birth: [birth year]  Today's Date: 07/05/2015 OT Individual Time: 1015-1105 OT Individual Time Calculation (min): 50 min (missed 10 min due to EKG and MD visit)    Short Term Goals: Week 1:  OT Short Term Goal 1 (Week 1): Pt will demonstrate increased awareness to RUE by positioning appropriately to decrease shoulder subluxation OT Short Term Goal 1 - Progress (Week 1): Not met OT Short Term Goal 2 (Week 1): Pt will completed squat pivot transfer to toilet with max assist of one caregiver OT Short Term Goal 2 - Progress (Week 1): Not met OT Short Term Goal 3 (Week 1): Pt will complete UB dressing with mod assist OT Short Term Goal 3 - Progress (Week 1): Not met OT Short Term Goal 4 (Week 1): Pt will complete LB dressing with max assist of one caregiver at sit > stand level OT Short Term Goal 4 - Progress (Week 1): Not met OT Short Term Goal 5 (Week 1): Pt will complete bathing with mod assist at sit > stand level OT Short Term Goal 5 - Progress (Week 1): Not met Week 2:  OT Short Term Goal 1 (Week 2): Pt will demonstrate increased awareness to RUE by positioning appropriately to decrease shoulder subluxation OT Short Term Goal 2 (Week 2): Pt will complete squat pivot transfer to toilet with max assist of one caregiver OT Short Term Goal 3 (Week 2): Pt will complete UB dressing with mod assist in unsupported sitting OT Short Term Goal 4 (Week 2): Pt will complete LB dressing with max assist of one caregiver at bed level OT Short Term Goal 5 (Week 2): Pt will complete bathing with mod assist at bed level      Skilled Therapeutic Interventions/Progress Updates:    Pt seen for skilled OT to facilitate attention to RUE, management of RUE and activity tolerance. Pt very fatigued with difficulty keeping eyes open.  [name] present and stated "he had a lot going on over weekend and is very tired".  Both agreeable to bathing/ dressing bed level. Pt continues to need max A with bed mobility and self care. To facilitate attention to RUE, encouraged pt to use L arm to hold R arm with rolling and initiate movement with head and trunk.  Pt continually closing eyes during treatment due to fatigue. Worked on [employer] with PNF patterns and tapping, no active movement present but pt did attend to arm well during treatment.  Educated wife on PROM to scapula with demonstration.  Pt resting in bed with limbs positioned with pillows. Wife in room with patient.  Therapy Documentation Precautions:  Precautions Precautions: Fall Precaution Comments: R hemi, pt on home O2 PTA for recent pneumonia (but used it PRN, not 24/7) Restrictions Weight Bearing Restrictions: No  Pain: Pain Assessment Pain Assessment: No/denies pain ADL:    See Function Navigator for Current Functional Status.   Therapy/Group: Individual Therapy  Doris Mcgilvery 07/05/2015, 12:40 PM

## 2015-07-05 NOTE — Progress Notes (Signed)
Physical Therapy Session Note  Patient Details  Name: Ivan Beasley MRN: [id number] Date of Birth: November 11, 1940  Today's Date: 07/05/2015 PT Individual Time: 1300-1415 PT Individual Time Calculation (min): 75 min   Short Term Goals: Week 2:  PT Short Term Goal 1 (Week 2): Pt will perform supine<>sit with modA PT Short Term Goal 2 (Week 2): Pt will perform transfer w/c <>bed with maxA +1 PT Short Term Goal 3 (Week 2): Pt will perform sit <>stand maxA PT Short Term Goal 4 (Week 2): Pt will demonstrate sitting balance x3 min with minA PT Short Term Goal 5 (Week 2): Pt will initiate gait training  Skilled Therapeutic Interventions/Progress Updates:    Pt received supine in bed, denies pain and agreeable to treatment. Supine>sit modA with bedrails and HOB elevated. Sit <>stand in stedy with maxA; poor postural control with R lateral lean and max cueing/facilitation for hip extension to allow for seat to be lowered behind pt. Seated in stedy, attempted to use mirror for trunk righting, pt able to achieve midline however cannot maintain >10 seconds. Sit <>stand x5 trials in parallel bars with maxA for cueing/facilitating trunk/hip extension, and poor weight bearing/stance control on R side. Pt unable to correct to cues for midline orientation when preparing to stand with R shoulder leaning on parallel bars. Lateral scoot w/c <>mat table with maxA and transfer board. Seated balance while engaged in boxing on heavy bag, and ball hits with LUE. Consistent LOB to R side with poor weight shift and weight bearing through L hip. Repetitive L sidelying on elbow to midline sitting to facilitate L weight shift, R trunk lengthening. Returned to room totalA; remained semi-reclined in w/c at completion of session with RUE supported, wife present and all needs in reach.   Therapy Documentation Precautions:  Precautions Precautions: Fall Precaution Comments: R hemi, pt on home O2 PTA for recent pneumonia (but used  it PRN, not 24/7) Restrictions Weight Bearing Restrictions: No Pain: Pain Assessment Pain Assessment: No/denies pain   See Function Navigator for Current Functional Status.   Therapy/Group: Individual Therapy  Luberta Mutter 07/05/2015, 3:59 PM

## 2015-07-05 NOTE — Progress Notes (Signed)
Speech Language Pathology Daily Session Note  Patient Details  Name: Ivan Beasley MRN: [id number] Date of Birth: 06-14-1940  Today's Date: 07/05/2015 SLP Individual Time: 3254-9826 SLP Individual Time Calculation (min): 45 min  Short Term Goals: Week 2: SLP Short Term Goal 1 (Week 2): Pt will be intelligible in phrases with mod assist verbal cues for overarticulation, increased vocal intensity,  SLP Short Term Goal 2 (Week 2): Pt will consume Dys 1 textures and nectar thick liquids with min assist verbal cues for use of swallowing precautions and minimal overt s/s of aspiration.  SLP Short Term Goal 3 (Week 2): Pt will consume therapeutic trials of thin liquids via teaspoon with min assist verbal cues for use of swallowing precautions and minimal overt s/s of aspiration.  SLP Short Term Goal 4 (Week 2): Pt will return demonstration of pharyngeal strengthening exercises for 25 repetitions each with min assist verbal cues.   SLP Short Term Goal 5 (Week 2): Pt will sustain his attention to basic tasks for 2-3 minute intervals with min verbal cues for redirection.   SLP Short Term Goal 6 (Week 2): Pt will return demonstration of IMST and RMST devices with supervision verbal cues at 9 cm H2O and 15 cm H2O respectively with effort level rated <7/10 over three consecutive sessions.   Skilled Therapeutic Interventions: Pt was seen for skilled ST targeting goals for speech intelligibility.  Pt was able to tolerate increased resistance on IMST device up to 13cm H2O with a self perceived effort rating of <5/10 over 25 repetitions.  Despite improvements in use of IMST device, pt demonstrated significantly greater difficulty returning demonstration of EMST device, requiring decrease in resistance to 11 cm H2O and max assist multimodal cues to achieve labial seal around device to achieve 25 repetitions.  After reviewing speech intelligibility strategies targeted in previous therapy sessions, pt was able to  utilize overarticulation, loud voice, and slow rate of speech to achieve intelligibility at the phrase level during a verbal picture description task.  Pt was left in bed with call bell within reach and wife at bedside.  Continue per current plan of care.    Function:  Eating Eating                 Cognition Comprehension Comprehension assist level: Understands basic 75 - 89% of the time/ requires cueing 10 - 24% of the time  Expression   Expression assist level: Expresses basic 50 - 74% of the time/requires cueing 25 - 49% of the time. Needs to repeat parts of sentences.  Social Interaction Social Interaction assist level: Interacts appropriately 50 - 74% of the time - May be physically or verbally inappropriate.  Problem Solving Problem solving assist level: Solves basic 50 - 74% of the time/requires cueing 25 - 49% of the time  Memory Memory assist level: Recognizes or recalls 50 - 74% of the time/requires cueing 25 - 49% of the time    Pain Pain Assessment Pain Assessment: No/denies pain  Therapy/Group: Individual Therapy  Jarika Robben, [name] 07/05/2015, 12:52 PM

## 2015-07-05 NOTE — Progress Notes (Signed)
Recreational Therapy Session Note  Patient Details  Name: Ivan Beasley MRN: [id number] Date of Birth: October 29, 1940 Today's Date: 07/05/2015  Pt participated in animal assisted activity/therapy seated EOM with close supervision-max assist for balance, wife present and participatory.   Ivan Beasley 07/05/2015, 3:53 PM

## 2015-07-05 NOTE — Progress Notes (Signed)
Subjective/Complaints: Wife feels like facial droop is improving No abdominal pain, nausea, vomiting. Tolerating by mouth with modified diet Review of systems aphasic limiting review Objective: Vital Signs: Blood pressure 160/65, pulse 88, temperature 97.8 F (36.6 C), temperature source Oral, resp. rate 18, weight 93.668 kg (206 lb 8 oz), SpO2 97 %. Ct Abdomen Pelvis Wo Contrast  07/04/2015  CLINICAL DATA:  Anemia, history coronary disease post MI, diabetes mellitus, former smoker, stage III chronic kidney disease, LEFT lung cancer, atrial fibrillation EXAM: CT ABDOMEN AND PELVIS WITHOUT CONTRAST TECHNIQUE: Multidetector CT imaging of the abdomen and pelvis was performed following the standard protocol without IV contrast. Sagittal and coronal MPR images reconstructed from axial data set. No oral contrast was administered. COMPARISON:  None. CT from PET-CT exam 05/03/2015 FINDINGS: Lower chest:  Bibasilar atelectasis. Hepatobiliary: Cholelithiasis. Beam hardening artifacts from patient's arms traverse liver, no gross hepatic abnormality seen. Pancreas: Atrophic, otherwise unremarkable Spleen: Traversing artifacts, grossly normal appearance Adrenals/Urinary Tract: Normal appearing adrenal glands. Cyst at inferior pole LEFT kidney again identified 3.4 x 3.6 cm image 49. Kidneys otherwise unremarkable without additional mass or hydronephrosis. Single nondilated ureters bilaterally. Normal appearing well distended urinary bladder. Stomach/Bowel: Normal appendix. Retained dense contrast within colon from recent swallowing study. Stomach and bowel loops otherwise normal appearance for technique. Vascular/Lymphatic: Atherosclerotic calcifications aorta, iliac, and femoral arteries. Few coronary arterial calcifications. Low-attenuation of circulating blood consistent with history of anemia. No adenopathy. Reproductive: N/A Other: Small BILATERAL inguinal hernias containing fat. No free air or free fluid.  Musculoskeletal: Degenerative disc disease changes L4-L5. No acute osseous findings. IMPRESSION: LEFT renal cyst. Cholelithiasis. Diffuse colonic diverticulosis without gross evidence acute diverticulitis. Small BILATERAL inguinal hernias containing fat. Aortic atherosclerosis. Electronically Signed   By: Lavonia Dana M.D.   On: 07/04/2015 12:55   Results for orders placed or performed during the hospital encounter of 06/16/2015 (from the past 72 hour(s))  Glucose, capillary     Status: Abnormal   Collection Time: 07/02/15  9:11 PM  Result Value Ref Range   Glucose-Capillary 135 (H) 65 - 99 mg/dL  CBC     Status: Abnormal   Collection Time: 07/03/15  4:20 AM  Result Value Ref Range   WBC 3.9 (L) 4.0 - 10.5 K/uL   RBC 2.91 (L) 4.22 - 5.81 MIL/uL   Hemoglobin 7.7 (L) 13.0 - 17.0 g/dL   HCT 25.1 (L) 39.0 - 52.0 %   MCV 86.3 78.0 - 100.0 fL   MCH 26.5 26.0 - 34.0 pg   MCHC 30.7 30.0 - 36.0 g/dL   RDW 17.8 (H) 11.5 - 15.5 %   Platelets 214 150 - 400 K/uL  Heparin level (unfractionated)     Status: None   Collection Time: 07/03/15  4:20 AM  Result Value Ref Range   Heparin Unfractionated 0.68 0.30 - 0.70 IU/mL    Comment:        IF HEPARIN RESULTS ARE BELOW EXPECTED VALUES, AND PATIENT DOSAGE HAS BEEN CONFIRMED, SUGGEST FOLLOW UP TESTING OF ANTITHROMBIN III LEVELS.   Glucose, capillary     Status: Abnormal   Collection Time: 07/03/15  6:58 AM  Result Value Ref Range   Glucose-Capillary 149 (H) 65 - 99 mg/dL  Glucose, capillary     Status: Abnormal   Collection Time: 07/03/15 11:29 AM  Result Value Ref Range   Glucose-Capillary 151 (H) 65 - 99 mg/dL  Glucose, capillary     Status: Abnormal   Collection Time: 07/03/15  4:47 PM  Result Value Ref Range   Glucose-Capillary 118 (H) 65 - 99 mg/dL  Heparin level (unfractionated)     Status: Abnormal   Collection Time: 07/03/15  5:58 PM  Result Value Ref Range   Heparin Unfractionated 0.28 (L) 0.30 - 0.70 IU/mL    Comment:        IF  HEPARIN RESULTS ARE BELOW EXPECTED VALUES, AND PATIENT DOSAGE HAS BEEN CONFIRMED, SUGGEST FOLLOW UP TESTING OF ANTITHROMBIN III LEVELS.   Glucose, capillary     Status: Abnormal   Collection Time: 07/03/15  9:21 PM  Result Value Ref Range   Glucose-Capillary 131 (H) 65 - 99 mg/dL  CBC     Status: Abnormal   Collection Time: 07/04/15  5:04 AM  Result Value Ref Range   WBC 4.2 4.0 - 10.5 K/uL   RBC 2.79 (L) 4.22 - 5.81 MIL/uL   Hemoglobin 7.3 (L) 13.0 - 17.0 g/dL   HCT 24.3 (L) 39.0 - 52.0 %   MCV 87.1 78.0 - 100.0 fL   MCH 26.2 26.0 - 34.0 pg   MCHC 30.0 30.0 - 36.0 g/dL   RDW 17.9 (H) 11.5 - 15.5 %   Platelets 201 150 - 400 K/uL  Heparin level (unfractionated)     Status: Abnormal   Collection Time: 07/04/15  5:04 AM  Result Value Ref Range   Heparin Unfractionated 0.20 (L) 0.30 - 0.70 IU/mL    Comment:        IF HEPARIN RESULTS ARE BELOW EXPECTED VALUES, AND PATIENT DOSAGE HAS BEEN CONFIRMED, SUGGEST FOLLOW UP TESTING OF ANTITHROMBIN III LEVELS.   Glucose, capillary     Status: Abnormal   Collection Time: 07/04/15  6:35 AM  Result Value Ref Range   Glucose-Capillary 122 (H) 65 - 99 mg/dL  Glucose, capillary     Status: Abnormal   Collection Time: 07/04/15 11:55 AM  Result Value Ref Range   Glucose-Capillary 104 (H) 65 - 99 mg/dL  Occult blood card to lab, stool Provider will collect     Status: None   Collection Time: 07/04/15 12:14 PM  Result Value Ref Range   Fecal Occult Bld NEGATIVE NEGATIVE  Prepare RBC     Status: None   Collection Time: 07/04/15 12:55 PM  Result Value Ref Range   Order Confirmation ORDER PROCESSED BY BLOOD BANK   Type and screen [place]     Status: None   Collection Time: 07/04/15  2:15 PM  Result Value Ref Range   ABO/RH(D) O NEG    Antibody Screen NEG    Sample Expiration 07/07/2015    Unit Number [id number]    Blood Component Type RED CELLS,LR    Unit division 00    Status of Unit ISSUED,FINAL     Transfusion Status OK TO TRANSFUSE    Crossmatch Result Compatible   Folate RBC     Status: Abnormal   Collection Time: 07/04/15  2:15 PM  Result Value Ref Range   Folate, Hemolysate 273.8 Not Estab. ng/mL   Hematocrit 24.1 (L) 37.5 - 51.0 %   Folate, RBC 1136 >498 ng/mL    Comment: (NOTE) Performed At: [name] [address] [place], [place] [id number] Lindon Romp MD VH:[id number]   ABO/Rh     Status: None   Collection Time: 07/04/15  2:15 PM  Result Value Ref Range   ABO/RH(D) O NEG   Glucose, capillary     Status: Abnormal   Collection Time: 07/04/15  4:26 PM  Result Value Ref Range   Glucose-Capillary 120 (H) 65 - 99 mg/dL  CBC     Status: Abnormal   Collection Time: 07/04/15  5:25 PM  Result Value Ref Range   WBC 4.1 4.0 - 10.5 K/uL   RBC 3.01 (L) 4.22 - 5.81 MIL/uL   Hemoglobin 8.2 (L) 13.0 - 17.0 g/dL   HCT 26.4 (L) 39.0 - 52.0 %   MCV 87.7 78.0 - 100.0 fL   MCH 27.2 26.0 - 34.0 pg   MCHC 31.1 30.0 - 36.0 g/dL   RDW 17.6 (H) 11.5 - 15.5 %   Platelets 199 150 - 400 K/uL  Iron and TIBC     Status: Abnormal   Collection Time: 07/04/15  5:25 PM  Result Value Ref Range   Iron 26 (L) 45 - 182 ug/dL   TIBC 340 250 - 450 ug/dL   Saturation Ratios 8 (L) 17.9 - 39.5 %   UIBC 314 ug/dL  Ferritin     Status: Abnormal   Collection Time: 07/04/15  5:25 PM  Result Value Ref Range   Ferritin 21 (L) 24 - 336 ng/mL  Vitamin B12     Status: None   Collection Time: 07/04/15  5:25 PM  Result Value Ref Range   Vitamin B-12 267 180 - 914 pg/mL    Comment: (NOTE) This assay is not validated for testing neonatal or myeloproliferative syndrome specimens for Vitamin B12 levels.   Glucose, capillary     Status: None   Collection Time: 07/04/15  8:43 PM  Result Value Ref Range   Glucose-Capillary 87 65 - 99 mg/dL  Hemoglobin and hematocrit, blood     Status: Abnormal   Collection Time: 07/04/15  9:00 PM  Result Value Ref Range   Hemoglobin 9.0 (L) 13.0 - 17.0  g/dL   HCT 28.9 (L) 39.0 - 52.0 %  CBC with Differential/Platelet     Status: Abnormal   Collection Time: 07/05/15  4:43 AM  Result Value Ref Range   WBC 5.4 4.0 - 10.5 K/uL   RBC 3.38 (L) 4.22 - 5.81 MIL/uL   Hemoglobin 8.9 (L) 13.0 - 17.0 g/dL   HCT 28.9 (L) 39.0 - 52.0 %   MCV 85.5 78.0 - 100.0 fL   MCH 26.3 26.0 - 34.0 pg   MCHC 30.8 30.0 - 36.0 g/dL   RDW 17.8 (H) 11.5 - 15.5 %   Platelets 193 150 - 400 K/uL   Neutrophils Relative % 71 %   Neutro Abs 3.8 1.7 - 7.7 K/uL   Lymphocytes Relative 13 %   Lymphs Abs 0.7 0.7 - 4.0 K/uL   Monocytes Relative 12 %   Monocytes Absolute 0.7 0.1 - 1.0 K/uL   Eosinophils Relative 4 %   Eosinophils Absolute 0.2 0.0 - 0.7 K/uL   Basophils Relative 0 %   Basophils Absolute 0.0 0.0 - 0.1 K/uL  Basic metabolic panel     Status: Abnormal   Collection Time: 07/05/15  4:43 AM  Result Value Ref Range   Sodium 134 (L) 135 - 145 mmol/L   Potassium 4.5 3.5 - 5.1 mmol/L   Chloride 105 101 - 111 mmol/L   CO2 22 22 - 32 mmol/L   Glucose, Bld 128 (H) 65 - 99 mg/dL   BUN <5 (L) 6 - 20 mg/dL   Creatinine, Ser 1.14 0.61 - 1.24 mg/dL   Calcium 9.6 8.9 - 10.3 mg/dL   GFR calc non Af Amer >60 >60 mL/min  GFR calc Af Amer >60 >60 mL/min    Comment: (NOTE) The eGFR has been calculated using the CKD EPI equation. This calculation has not been validated in all clinical situations. eGFR's persistently <60 mL/min signify possible Chronic Kidney Disease.    Anion gap 7 5 - 15  Glucose, capillary     Status: Abnormal   Collection Time: 07/05/15  6:21 AM  Result Value Ref Range   Glucose-Capillary 120 (H) 65 - 99 mg/dL  Glucose, capillary     Status: Abnormal   Collection Time: 07/05/15 11:59 AM  Result Value Ref Range   Glucose-Capillary 120 (H) 65 - 99 mg/dL     HEENT: normal Cardio: irregular irregular Resp: CTA B/L and unlabored GI: BS positive and nontender nondistended Extremity:  No Edema Skin:   Intact Neuro: Flat, Abnormal Sensory cannot  assess secondary to aphasia, Abnormal Motor 2 minus finger flexors, biceps and triceps , 0 at deltoid, 3 minus at the hip knee extensors as well as plantar flexors. 0 at the ankle dorsiflexors., Dysarthric and Aphasic, increased facial droop Musc/Skel:  Normal Gen. no acute distress   Assessment/Plan: 1. 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 Physiatrist is providing close team supervision and 24 hour management of active medical problems listed below. Physiatrist and rehab team continue to assess barriers to discharge/monitor patient progress toward functional and medical goals. FIM: Function - Bathing Position: Bed Body parts bathed by patient: Chest, Abdomen, Right arm, Front perineal area Body parts bathed by helper: Back, Buttocks Bathing not applicable: Right upper leg, Left upper leg, Right lower leg, Left lower leg Assist Level: 2 helpers (Total assist)  Function- Upper Body Dressing/Undressing What is the patient wearing?: Pull over shirt/dress Pull over shirt/dress - Perfomed by patient: Thread/unthread left sleeve, Put head through opening Pull over shirt/dress - Perfomed by helper: Pull shirt over trunk, Thread/unthread right sleeve Assist Level: 2 helpers (Max assist, +2 for sitting balance) Function - Lower Body Dressing/Undressing What is the patient wearing?: Pants, Non-skid slipper socks Position: Bed Pants- Performed by helper: Thread/unthread right pants leg, Thread/unthread left pants leg, Pull pants up/down Non-skid slipper socks- Performed by helper: Don/doff right sock, Don/doff left sock Assist for footwear: Dependant Assist for lower body dressing:  (Total assist)  Function - Toileting Toileting activity did not occur: No continent bowel/bladder event Assist level: Two helpers (per [employer], NT)  Function -  Air cabin crew transfer activity did not occur: Safety/medical concerns Assist level to toilet: 2 helpers (per Berkley Harvey, NT) Assist level from toilet: 2 helpers  Function - Chair/bed transfer Chair/bed transfer activity did not occur: Safety/medical concerns Chair/bed transfer method: Lateral scoot Chair/bed transfer assist level: Maximal assist (Pt 25 - 49%/lift and lower) Chair/bed transfer assistive device: Armrests, Sliding board Mechanical lift: Stedy Chair/bed transfer details: Verbal cues for precautions/safety, Manual facilitation for weight shifting, Manual facilitation for placement, Verbal cues for sequencing, Verbal cues for technique, Tactile cues for placement, Tactile cues for posture, Tactile cues for weight shifting  Function - Locomotion: Wheelchair Will patient use wheelchair at discharge?: Yes Type: Manual Max wheelchair distance: 75 Assist Level: Maximal assistance (Pt 25 - 49%) Assist Level: Maximal assistance (Pt 25 - 49%) Wheel 150 feet activity did not occur: Safety/medical concerns Turns around,maneuvers to table,bed, and toilet,negotiates 3% grade,maneuvers on rugs and over doorsills: No Function - Locomotion: Ambulation Ambulation activity did  not occur: Safety/medical concerns Assistive device: Rail in hallway Max distance: 25 Assist level: Maximal assist (Pt 25 - 49%) Assist level: Maximal assist (Pt 25 - 49%)  Function - Comprehension Comprehension: Auditory Comprehension assistive device: Hearing aids Comprehension assist level: Understands basic 75 - 89% of the time/ requires cueing 10 - 24% of the time  Function - Expression Expression: Verbal Expression assist level: Expresses basic 50 - 74% of the time/requires cueing 25 - 49% of the time. Needs to repeat parts of sentences.  Function - Social Interaction Social Interaction assist level: Interacts appropriately 50 - 74% of the time - May be physically or verbally  inappropriate.  Function - Problem Solving Problem solving assist level: Solves basic 50 - 74% of the time/requires cueing 25 - 49% of the time  Function - Memory Memory assist level: Recognizes or recalls 50 - 74% of the time/requires cueing 25 - 49% of the time Patient normally able to recall (first 3 days only): Current season    Medical Problem List and Plan: 1.  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 Plan to follow-up MRI of the brain after ICH absorbed for further evaluation of possible metastases posterior left frontal lobe Plan to repeat MRI next week prior to discharge with neurology to follow up             -we'll discontinueIV heparin for now, 2.  DVT Prophylaxis/Anticoagulation: SCDs. Monitor for signs of DVT, subcutaneous heparin 3. Pain Management: Ultram and oxycodone as needed 4. Mood: Xanax 0.25 mg 3 times daily as needed 5. Neuropsych: This patient is not capable of making decisions on his own behalf with caregiver/staff assistance. 6. Skin/Wound Care: Routine skin checks 7. Fluids/Electrolytes/Nutrition: Routine I&O's with follow-up chemistries,severe swallowing difficulties, increased cough.  At this point I do not think it is a sign of neurologic decline 8. Seizure prophylaxis. Keppra now at 1000 mg twice a day---more alert. EEG displayed seizure activity 9. Atrial fibrillation. Cardiac rate control. Continue amiodarone 200 mg daily, Cardizem 60 mg twice a day.Eliquis held secondary to [place], family does not want to resume, would be comfortable with Xarelto ,Will ask pharmacy to initiate low-dose tomorrow if hemoglobin remains stable Filed Vitals:   07/04/15 1943 07/05/15 0300  BP: 137/68 160/65  Pulse: 94 88  Temp: 98.5 F (36.9 C) 97.8 F (36.6 C)  Resp: 16 18   10. 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 This is likely the etiology of the persistent  cough, Right lung nodules,As noted plan follow-up MRI after ICH absorbed for further evaluation of possible metastasis 11. Diabetes mellitus with peripheral neuropathy. Hemoglobin A1c 6.8. Glucophage 1000 mg at breakfast and 500 mg supper , Generally well controlled on the current regimen Amaryl 1 mg daily. Check blood sugars before meals and at bedtime CBG (last 3) generally controlled  Recent Labs  07/04/15 2043 07/05/15 0621 07/05/15 1159  GLUCAP 87 120* 120*    12. Hyperlipidemia. Lipitor 13. UTI enterobacter cloacae- Sensitive to ceftriaxone. We'll start 1 g every 24 hours.  Afebrile , 7 day course    We'll complete on Monday           4. Anemia stool guaiac negative, thus far looks like normocytic anemia of chronic disease exacerbated by frequent blood draws while on IV heparin. Will monitor for stability repeat CBC in a.m.    LOS (Days) 14 A FACE TO FACE EVALUATION  WAS PERFORMED  Charlett Blake 07/05/2015, 5:08 PM

## 2015-07-05 NOTE — Progress Notes (Addendum)
[facility] PROGRESS NOTE  MICKEL SCHREUR QJJ:[id number] DOB: Jun 12, 1940 DOA: 06/16/2015   PCP: Albina Billet, MD     Assessment/Plan: Principal Problem:   ICH (intracerebral hemorrhage) ([place])- L frontal ICH Active Problems:   Mild chronic obstructive pulmonary disease (HCC)   Seizures ([place]), possible   CKD (chronic kidney disease), stage III   Anemia   Right hemiparesis ([place])   Cerebrovascular accident (CVA) due to embolism of left middle cerebral artery (HCC)   Chronic anticoagulation   Malignant neoplasm of left lung (HCC)   Adjustment disorder with mixed anxiety and depressed mood   Ivan Beasley is a 75 y.o. 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 Consultation requested for anemia   Assessment and plan   Normocytic Anemia - anemia panel consistent with anemia of chronic disease, baseline hemoglobin has been around 8.2 ,?suspect iatrogenic from frequent blood draws, vs chronic disease/other deficiencies such as iron/b12/folate, hg improving , would limit blood draws to q 48 hrs  - CT abdomen and pelvis negative for  retroperitoneal bleed   Fecal occult blood negative  - orthostatic negative currently. -cont  holding Heparin drip , may need to resume xarelto if no obvious source of bleeding is found  In the interim would recommend heparin or Lovenox for DVT prophylaxis - given pt's CAD hx, would recd transfusion 1p rbc, for goal hb >8.  Status post 1 unit of packed red blood cells on 6/25, hemoglobin now  8.9 Vitamin B-12 is 267, marginally low, therefore start vitamin B-12 sublingually   2. Recent Right hemiplegia secondary to left MCA infarct adjacent to left frontal ICH with adjacent subarachnoid hemorrhage while on Eliquis for Atrial fib.  - Due to near resolution of ICH as well as has been >2 weeks, and high risk for recurrent embolic stroke off anticoagulation and relative small size of infarct, neurology Dr. Erlinda Hong  recommended on 6/20, to consider transition back to [place], wife okay with Xarelto, this can be started in the next couple of days if hemoglobin remains stable    MRI with and without contrast 06/26/15 can not confirm or rule out metastasis at this time. As per neurology Still need to repeat MRI with and without contrast after blood all absorbed.    3. Atrial fib. - sounds PAF., Currently normal sinus rhythm EKG  shows normal sinus rhythm - recd holding hep gtt for now - CHADsVASc score at least 6, very high risk for recurrent strokes, will need to weigh risk/benefits of resuming anticoagulation after r/o bleeding risk.   4. htn - stable,   5. Dm 2 w/ neuropathy - controlled  6. Cad, sp cabg - in light of cardiac hx, will trx for Hb >8 at this time  7. 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 UTI enterobacter cloacae- Sensitive to ceftriaxone. We'll start 1 g every 24 hours. Afebrile , 7 day course We'll complete on  6/26   DVT prophylaxsis : Heparin  Code Status:  Full code       Family Communication: Discussed in detail with the [name],  all imaging results, lab results explained to the patient   Disposition Plan:  As above      Consultants:  [facility]  Procedures:   None  Antibiotics: Anti-infectives    Start     Dose/Rate Route Frequency Ordered Stop   06/28/15 1000  cefTRIAXone (ROCEPHIN) 1 g in dextrose 5 % 50 mL IVPB  Status:  Discontinued     1  g 100 mL/hr over 30 Minutes Intravenous Every 24 hours 06/28/15 0847 07/05/15 0834   06/27/15 2100  vancomycin (VANCOCIN) 1,500 mg in sodium chloride 0.9 % 500 mL IVPB  Status:  Discontinued     1,500 mg 250 mL/hr over 120 Minutes Intravenous Every 24 hours 06/26/15 2018 06/28/15 0843      HPI/Subjective: Has a nonproductive cough, no chest pain, no worsening focal weakness  Objective: Filed Vitals:   07/04/15 1629 07/04/15 1644 07/04/15 1943 07/05/15 0300  BP: 132/53 140/62 137/68 160/65  Pulse: 91 88 94 88  Temp: 98.4 F (36.9 C) 98.4 F (36.9 C) 98.5 F (36.9 C) 97.8 F (36.6 C)  TempSrc: Oral Oral Oral Oral  Resp: '18 18 16 18  '$ Weight:      SpO2: 98% 100% 97%     Intake/Output Summary (Last 24 hours) at 07/05/15 0914 Last data filed at 07/04/15 1943  Gross per 24 hour  Intake    395 ml  Output      0 ml  Net    395 ml    Exam:  Examination:  General exam: Appears calm and comfortable  Respiratory system: Clear to auscultation. Respiratory effort normal. Cardiovascular system: S1 & S2 heard, RRR. No JVD, murmurs, rubs, gallops or clicks. No pedal edema. Gastrointestinal system: Abdomen is nondistended, soft and nontender. No organomegaly or masses felt. Normal bowel sounds heard. Central nervous system: Alert and oriented. Right upper extremity weakness Extremities: Symmetric 5 x 5 power. Skin: No rashes, lesions or ulcers Psychiatry: Judgement and insight appear normal. Mood & affect appropriate.     Data Reviewed: I have personally reviewed following labs and imaging studies  Micro Results Recent Results (from the past 240 hour(s))  Culture, Urine     Status: Abnormal   Collection Time: 06/26/15  5:17 PM  Result Value Ref Range Status   Specimen Description URINE, CATHETERIZED  Final   Special Requests NONE  Final   Culture >=100,000 COLONIES/mL ENTEROBACTER CLOACAE (A)  Final   Report Status 06/28/2015 FINAL  Final   Organism ID, Bacteria  ENTEROBACTER CLOACAE (A)  Final      Susceptibility   Enterobacter cloacae - MIC*    CEFAZOLIN >=64 RESISTANT Resistant     CEFTRIAXONE 8 SENSITIVE Sensitive     CIPROFLOXACIN <=0.25 SENSITIVE Sensitive     GENTAMICIN <=1 SENSITIVE Sensitive     IMIPENEM <=0.25 SENSITIVE Sensitive     NITROFURANTOIN <=16 SENSITIVE Sensitive     TRIMETH/SULFA <=20 SENSITIVE Sensitive     PIP/TAZO 32 INTERMEDIATE Intermediate     * >=100,000 COLONIES/mL ENTEROBACTER CLOACAE  Culture, blood (routine x 2)     Status: None   Collection Time: 06/26/15  6:06 PM  Result Value Ref Range Status   Specimen Description BLOOD LEFT ANTECUBITAL  Final   Special Requests BOTTLES DRAWN AEROBIC AND ANAEROBIC 5CC  Final   Culture NO GROWTH 5 DAYS  Final   Report Status 07/01/2015 FINAL  Final  Culture, blood (routine x 2)     Status: None   Collection Time: 06/26/15  6:41 PM  Result Value Ref Range Status   Specimen Description BLOOD LEFT HAND  Final   Special Requests BOTTLES DRAWN AEROBIC AND ANAEROBIC 5CC  Final   Culture NO GROWTH 5 DAYS  Final   Report Status 07/01/2015 FINAL  Final    Radiology Reports Ct Abdomen Pelvis Wo Contrast  07/04/2015  CLINICAL DATA:  Anemia, history coronary disease post MI, diabetes mellitus, former smoker, stage III chronic kidney disease, LEFT lung cancer, atrial fibrillation EXAM: CT ABDOMEN AND PELVIS WITHOUT CONTRAST TECHNIQUE: Multidetector CT imaging of the abdomen and pelvis was performed following the standard protocol without IV contrast. Sagittal and coronal MPR images reconstructed from axial data set. No oral contrast was administered. COMPARISON:  None. CT from PET-CT exam 05/03/2015 FINDINGS: Lower chest:  Bibasilar atelectasis. Hepatobiliary: Cholelithiasis. Beam hardening artifacts from patient's arms traverse liver, no gross hepatic abnormality seen. Pancreas: Atrophic, otherwise unremarkable Spleen: Traversing artifacts, grossly normal appearance Adrenals/Urinary Tract:  Normal appearing adrenal glands. Cyst at inferior pole LEFT kidney again identified 3.4 x 3.6 cm image 49. Kidneys otherwise unremarkable without additional mass or hydronephrosis. Single nondilated ureters bilaterally. Normal appearing well distended urinary bladder. Stomach/Bowel: Normal appendix. Retained dense contrast within colon from recent swallowing study. Stomach and bowel loops otherwise normal appearance for technique. Vascular/Lymphatic: Atherosclerotic calcifications aorta, iliac, and femoral arteries. Few coronary arterial calcifications. Low-attenuation of circulating blood consistent with history of anemia. No adenopathy. Reproductive: N/A Other: Small BILATERAL inguinal hernias containing fat. No free air or free fluid. Musculoskeletal: Degenerative disc disease changes L4-L5. No acute osseous findings. IMPRESSION: LEFT renal cyst. Cholelithiasis. Diffuse colonic diverticulosis without gross evidence acute diverticulitis. Small BILATERAL inguinal hernias containing fat. Aortic atherosclerosis. Electronically Signed   By: Lavonia Dana M.D.   On: 07/04/2015 12:55   Dg Chest 2 View  07/02/2015  CLINICAL DATA:  Persistent cough, COPD, lung malignancy. Patient cyst has sustained an intracranial hemorrhage with right hemi [place] is EXAM: CHEST  2 VIEW COMPARISON:  Chest x-ray of June 26, 2015 FINDINGS: The right lung is adequately inflated and exhibits increased density in the infrahilar region. There is some overlap here of the Port-A-Cath reservoir. On the left there is persistent increased density in the perihilar region extending toward the lateral pleural surface. The heart is top-normal in size. There are post CABG changes. Three upper sternal wires are chronically broken. There is calcification in the wall of the aortic arch. There is no pleural effusion or pneumothorax. The Port-A-Cath appliance tip projects over the midportion of the SVC. IMPRESSION: 1. Subtle increased density at the right  lung base suggests subsegmental atelectasis. Stable parenchymal density in the left mid lung and left perihilar region. 2. Stable post CABG changes.  No evidence of CHF. Electronically Signed   By: David  Martinique M.D.   On: 07/02/2015 09:40   Dg Chest 2 View  06/26/2015  CLINICAL DATA:  C/o chronic cough. 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 Rales heard in right lower lung. 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 EXAM: CHEST  2 VIEW COMPARISON:  06/25/2015 FINDINGS: There stable changes from prior CABG surgery. Cardiac silhouette is mildly enlarged. Focal irregular opacity in the posterior perihilar region of the left lung is stable. There are no new areas of lung opacification. No pleural effusion or pneumothorax. Right anterior chest wall Port-A-Cath is stable. Bony thorax is intact. IMPRESSION: 1. No acute cardiopulmonary disease. 2. No change from the prior study. Persistent focal opacity  in the perihilar region of the left lung consistent with the reported squamous cell lung carcinoma. Electronically Signed   By: Lajean Manes M.D.   On: 06/26/2015 16:36   Dg Chest 2 View  06/25/2015  CLINICAL DATA:  Cough, left-sided weakness EXAM: CHEST  2 VIEW COMPARISON:  06/27/2015 FINDINGS: Cardiomediastinal silhouette is stable. Right IJ Port-A-Cath is unchanged in position. Persistent left upper lobe consolidation. Bony thorax is unremarkable. IMPRESSION: No significant change. Persistent left upper lobe consolidation. Stable right IJ Port-A-Cath position. Electronically Signed   By: Lahoma Crocker M.D.   On: 06/25/2015 16:54   Ct Head Wo Contrast  06/26/2015  CLINICAL DATA:  Slurred speech. EXAM: CT HEAD WITHOUT CONTRAST TECHNIQUE: Contiguous axial images were obtained from the base of the skull through the vertex without intravenous contrast. COMPARISON:  MRI of June 14, 2015.  CT scan of June 13, 2015. FINDINGS: Bony calvarium appears intact.  Sphenoid sinusitis is  noted. Mild diffuse cortical atrophy is noted. There is continued presence of left superior parietal intraparenchymal hemorrhage which now measures 21 x 11 mm and is slightly decreased in size compared to prior exam. Subarachnoid hemorrhage has resolved. There is continued surrounding vasogenic edema. No significant midline shift is noted. Ventricular size is unremarkable. IMPRESSION: Mild diffuse cortical atrophy. Continued presence of left parietal intraparenchymal hemorrhage with surrounding vasogenic edema. This is improved compared to prior exam. Subarachnoid hemorrhage is no longer present. Electronically Signed   By: Marijo Conception, M.D.   On: 06/26/2015 14:29   Ct Head Wo Contrast  06/13/2015  CLINICAL DATA:  The woke at 5 a.m. with right-sided weakness. EXAM: CT HEAD WITHOUT CONTRAST TECHNIQUE: Contiguous axial images were obtained from the base of the skull through the vertex without intravenous contrast. COMPARISON:  None. FINDINGS: There is intra cerebral hemorrhage noted in the posterior left frontal lobe. Subarachnoid extension. Hemorrhage measures 2.4 x 2.2 x 1.6 cm for a volume of 4.2 mL. Mild surrounding vasogenic edema. No significant mass effect. No midline shift. No hydrocephalus. No acute calvarial abnormality. Air-fluid level in the left sphenoid sinus. Mastoid air cells are clear. IMPRESSION: Left posterior frontal intra cerebral hemorrhage with subarachnoid extension as described above. Critical Value/emergent results were called by telephone at the time of interpretation on 06/13/2015 at 10:02 am to Dr. Loura Pardon , who verbally acknowledged these results. Electronically Signed   By: Rolm Baptise M.D.   On: 06/13/2015 10:04   Mr Jodene Nam Head Wo Contrast  06/15/2015  ADDENDUM REPORT: 06/15/2015 08:11 ADDENDUM: With the rounded contour of the enhancing component of the left frontal lobe hematoma, follow-up until clearance recommended to exclude underlying vascular abnormality. Electronically  Signed   By: Genia Del M.D.   On: 06/15/2015 08:11  06/15/2015  CLINICAL DATA:  75 year old diabetic male with history of lung cancer. Right-sided weakness. Subsequent encounter. EXAM: MRI HEAD WITHOUT CONTRAST MRA HEAD WITHOUT CONTRAST TECHNIQUE: Multiplanar, multiecho pulse sequences of the brain and surrounding structures were obtained without intravenous contrast. Angiographic images of the head were obtained using MRA technique without contrast. COMPARISON:  06/13/2015 head CT.  07/28/2014 brain MR. FINDINGS: MRI HEAD FINDINGS Posterior left frontal lobe complex 2.5 x 2.2 x 2 cm hemorrhagic lesion with marked surrounding vasogenic edema with breakthrough into sulci with subarachnoid hemorrhage noted. Patient's history of lung cancer in addition to 6 mm rounded area of enhanced along the posterior margin of this hemorrhage suggests that this is most likely is related to a hemorrhagic metastatic lesion  which has bled. Continued MR surveillance as hemorrhage clears is recommended. There may be a a second enhancing lesion within the anterior left parietal lobe with surrounding vasogenic edema. Venous infarct can have a similar appearance. The major dural sinuses appear patent. No acute thrombotic infarct separate from the above described findings. Remote small left parietal lobe, left frontal lobe, posterior left opercular and tiny right thalamic infarct. Mild small vessel disease changes. Moderate global atrophy without hydrocephalus. Opacification left sphenoid sinus with air-fluid level suggesting acute sinusitis. Mild mucosal thickening ethmoid sinus air cells and minimal mucosal thickening frontal sinuses. Bilateral mastoid air cell and middle ear opacification greater on the left without obstructing lesion of the eustachian tube noted. Post lens replacement without acute orbital abnormality noted. MRA HEAD FINDINGS MR angiogram does not incorporate the left frontal lobe hemorrhage. Mild narrowing  supraclinoid aspect internal carotid artery bilaterally with irregularity more notable on the left. Small infundibulum on the left posterior communicating artery level. Anterior circulation without medium or large size vessel significant stenosis or occlusion. Middle cerebral artery branch vessel irregularity bilaterally. Right vertebral artery ends in a posterior inferior cerebellar artery distribution. No significant narrowing left vertebral artery. Moderate narrowing portions of the left posterior inferior cerebellar artery. Ectatic basilar artery without high-grade stenosis. Nonvisualized anterior inferior cerebellar arteries. Small left superior cerebellar artery. Posterior cerebral artery distal branch vessel narrowing bilaterally. IMPRESSION: MRI HEAD Posterior left frontal lobe complex 2.5 x 2.2 x 2 cm hemorrhagic lesion with marked surrounding vasogenic edema with breakthrough into sulci with subarachnoid hemorrhage noted. Patient's history of lung cancer in addition to 6 mm rounded area of enhanced along the posterior margin of this hemorrhage suggests that this is most likely is related to a hemorrhagic metastatic lesion which has bled. Continued MR surveillance as hemorrhage clears is recommended. There may be a a second enhancing lesion within the anterior left parietal lobe with surrounding vasogenic edema. Venous infarct can have a similar appearance. The major dural sinuses appear patent. Opacification left sphenoid sinus with air-fluid level suggesting acute sinusitis. Bilateral mastoid air cell and middle ear opacification greater on the left without obstructing lesion of the eustachian tube noted. MRA HEAD MR angiogram does not incorporate the left frontal lobe hemorrhage. Mild narrowing supraclinoid aspect internal carotid artery bilaterally with irregularity more notable on the left. Small infundibulum on the left posterior communicating artery level. Anterior circulation without medium or large  size vessel significant stenosis or occlusion. Middle cerebral artery branch vessel irregularity bilaterally. Right vertebral artery ends in a posterior inferior cerebellar artery distribution. No significant narrowing left vertebral artery. Moderate narrowing portions of the left posterior inferior cerebellar artery. Ectatic basilar artery without high-grade stenosis. Nonvisualized anterior inferior cerebellar arteries. Small left superior cerebellar artery. Posterior cerebral artery distal branch vessel narrowing bilaterally. Electronically Signed: By: Genia Del M.D. On: 06/14/2015 19:34   [name] OI Contrast  06/26/2015  CLINICAL DATA:  Initial evaluation for progressively garbled and slurred speech, with increased right-sided facial droop. EXAM: MRI HEAD WITHOUT AND WITH CONTRAST TECHNIQUE: Multiplanar, multiecho pulse sequences of the brain and surrounding structures were obtained without and with intravenous contrast. CONTRAST:  11m MULTIHANCE GADOBENATE DIMEGLUMINE 529 MG/ML IV SOLN COMPARISON:  Prior CT from earlier the same day as well as previous MRI from 06/14/2015. FINDINGS: Generalized cerebral atrophy with minimal chronic small vessel ischemic changes again noted, stable. Small remote lacunar infarct within the right thalamus. Previously identified posterior left frontal lobe complex hemorrhagic lesion again seen, overall  likely decreased in size from previous. Lesion measures 13 x 21 x 16 mm on T2 weighted sequences. Surrounding vasogenic edema is similar. Breakthrough subarachnoid hemorrhage within the adjacent cortical sulci again seen, improved. There is increased peripheral enhancement about the margin of the hemorrhagic lesion, likely related to the evolving hematoma. Previously noted small hemorrhagic nodule is not as clearly delineated on this exam. Note again made of a second possible enhancing lesion within the anterior left parietal lobe along the dura, measuring approximately 9 x  11 x 11 mm. Surrounding vasogenic edema. No other new enhancing lesions. Immediately lateral and inferior to the dominant hemorrhagic lesion. There is a focus of abnormal restricted diffusion involving the cortical gray matter and underlying white matter of the posterior left frontal region, compatible with acute ischemic infarct (series 4, image 35). Associated signal loss on ADC map. This appears to be more vascular distribution rather than seizure related. Additional small sub cm focus of restricted diffusion more posteriorly within the left centrum semi ovale. No significant mass effect. Area of infarction closely approximates the hemorrhagic lesion can subarachnoid hemorrhage, but does not appear to be hemorrhagic in of itself. No other acute infarct. Gray-white matter differentiation otherwise maintained. Major intracranial vascular flow voids are preserved. No midline shift. No hydrocephalus. No extra-axial fluid collection. Again, major dural sinuses appear to be grossly patent. Craniocervical junction within normal limits. Visualized upper cervical spine unremarkable. Pituitary gland within normal limits. No acute abnormality about the globes and orbits. Patient is status post lens extraction bilaterally. Mucosal thickening with fluid level within the left sphenoid sinus, similar to prior. Mild scattered opacity within the anterior ethmoidal air cells. Bilateral mastoid effusions present. Visualize bone marrow within normal limits. Scalp soft tissues demonstrate no acute abnormality. IMPRESSION: 1. Acute ischemic left MCA territory infarct adjacent to the posterior left frontal hemorrhagic lesion. Vasospasm as an underlying etiology is suspected given the adjacent complex hemorrhagic lesion and subarachnoid hemorrhage. 2. Continued interval evolution of complex left frontal lobe hemorrhagic lesion with similar vasogenic edema. Associated breakthrough subarachnoid hemorrhage is improved. This lesion now  demonstrates peripheral enhancement, which would be expected with an evolving hematoma. Previously noted small nodular focus of enhancement not well delineated on this exam. Again, continued MR surveillance at the hemorrhage clears is recommended to ensure no underlying lesion is present. 3. Additional 9 x 11 x 11 mm enhancing lesion within the anterior left parietal lobe, similar to prior. Attention at follow-up recommended. 4. Acute left sphenoid sinus disease with bilateral mastoid effusions, similar to prior. Electronically Signed   By: Jeannine Boga M.D.   On: 06/26/2015 23:30   [name] [place] Contrast  06/15/2015  ADDENDUM REPORT: 06/15/2015 08:11 ADDENDUM: With the rounded contour of the enhancing component of the left frontal lobe hematoma, follow-up until clearance recommended to exclude underlying vascular abnormality. Electronically Signed   By: Genia Del M.D.   On: 06/15/2015 08:11  06/15/2015  CLINICAL DATA:  75 year old diabetic male with history of lung cancer. Right-sided weakness. Subsequent encounter. EXAM: MRI HEAD WITHOUT CONTRAST MRA HEAD WITHOUT CONTRAST TECHNIQUE: Multiplanar, multiecho pulse sequences of the brain and surrounding structures were obtained without intravenous contrast. Angiographic images of the head were obtained using MRA technique without contrast. COMPARISON:  06/13/2015 head CT.  07/28/2014 brain MR. FINDINGS: MRI HEAD FINDINGS Posterior left frontal lobe complex 2.5 x 2.2 x 2 cm hemorrhagic lesion with marked surrounding vasogenic edema with breakthrough into sulci with subarachnoid hemorrhage noted. Patient's history of lung  cancer in addition to 6 mm rounded area of enhanced along the posterior margin of this hemorrhage suggests that this is most likely is related to a hemorrhagic metastatic lesion which has bled. Continued MR surveillance as hemorrhage clears is recommended. There may be a a second enhancing lesion within the anterior left parietal lobe  with surrounding vasogenic edema. Venous infarct can have a similar appearance. The major dural sinuses appear patent. No acute thrombotic infarct separate from the above described findings. Remote small left parietal lobe, left frontal lobe, posterior left opercular and tiny right thalamic infarct. Mild small vessel disease changes. Moderate global atrophy without hydrocephalus. Opacification left sphenoid sinus with air-fluid level suggesting acute sinusitis. Mild mucosal thickening ethmoid sinus air cells and minimal mucosal thickening frontal sinuses. Bilateral mastoid air cell and middle ear opacification greater on the left without obstructing lesion of the eustachian tube noted. Post lens replacement without acute orbital abnormality noted. MRA HEAD FINDINGS MR angiogram does not incorporate the left frontal lobe hemorrhage. Mild narrowing supraclinoid aspect internal carotid artery bilaterally with irregularity more notable on the left. Small infundibulum on the left posterior communicating artery level. Anterior circulation without medium or large size vessel significant stenosis or occlusion. Middle cerebral artery branch vessel irregularity bilaterally. Right vertebral artery ends in a posterior inferior cerebellar artery distribution. No significant narrowing left vertebral artery. Moderate narrowing portions of the left posterior inferior cerebellar artery. Ectatic basilar artery without high-grade stenosis. Nonvisualized anterior inferior cerebellar arteries. Small left superior cerebellar artery. Posterior cerebral artery distal branch vessel narrowing bilaterally. IMPRESSION: MRI HEAD Posterior left frontal lobe complex 2.5 x 2.2 x 2 cm hemorrhagic lesion with marked surrounding vasogenic edema with breakthrough into sulci with subarachnoid hemorrhage noted. Patient's history of lung cancer in addition to 6 mm rounded area of enhanced along the posterior margin of this hemorrhage suggests that this  is most likely is related to a hemorrhagic metastatic lesion which has bled. Continued MR surveillance as hemorrhage clears is recommended. There may be a a second enhancing lesion within the anterior left parietal lobe with surrounding vasogenic edema. Venous infarct can have a similar appearance. The major dural sinuses appear patent. Opacification left sphenoid sinus with air-fluid level suggesting acute sinusitis. Bilateral mastoid air cell and middle ear opacification greater on the left without obstructing lesion of the eustachian tube noted. MRA HEAD MR angiogram does not incorporate the left frontal lobe hemorrhage. Mild narrowing supraclinoid aspect internal carotid artery bilaterally with irregularity more notable on the left. Small infundibulum on the left posterior communicating artery level. Anterior circulation without medium or large size vessel significant stenosis or occlusion. Middle cerebral artery branch vessel irregularity bilaterally. Right vertebral artery ends in a posterior inferior cerebellar artery distribution. No significant narrowing left vertebral artery. Moderate narrowing portions of the left posterior inferior cerebellar artery. Ectatic basilar artery without high-grade stenosis. Nonvisualized anterior inferior cerebellar arteries. Small left superior cerebellar artery. Posterior cerebral artery distal branch vessel narrowing bilaterally. Electronically Signed: By: Genia Del M.D. On: 06/14/2015 19:34   Dg Chest Port 1 View  06/25/2015  CLINICAL DATA:  Cough and congestion. EXAM: PORTABLE CHEST 1 VIEW COMPARISON:  June 18, 2015 FINDINGS: There is persistent airspace consolidation in the left upper lobe with volume loss. Lungs elsewhere clear. Heart is upper normal in size with pulmonary vascularity within normal limits. Port-A-Cath tip is in the superior vena cava near the cavoatrial junction, stable. No pneumothorax. No adenopathy evident. Patient is status post coronary  artery bypass  grafting. IMPRESSION: Persistent left upper lobe airspace consolidation with volume loss. No change from 3 days prior. Stable cardiac silhouette. Electronically Signed   By: Lowella Grip III M.D.   On: 07/07/2015 07:25   Dg Chest Port 1 View  06/18/2015  CLINICAL DATA:  Cough EXAM: PORTABLE CHEST 1 VIEW COMPARISON:  06/16/2015 FINDINGS: Stable right jugular Port-A-Cath. Low volumes with bibasilar atelectasis. Left mid lung dense pulmonary opacity is stable. No pneumothorax. IMPRESSION: Stable left mid lung dense pulmonary opacity. Bibasilar atelectasis. Electronically Signed   By: Marybelle Killings M.D.   On: 06/18/2015 15:16   Dg Chest Port 1 View  06/16/2015  CLINICAL DATA:  75 year old male with lung cancer. No chest complaint at this time. EXAM: PORTABLE CHEST 1 VIEW COMPARISON:  Chest radiograph dated 06/15/2015 FINDINGS: There has been interval removal of the endotracheal and enteric tube. The stable area of density is again noted in the left mid lung field. Multiple surgical clips arms in this area. No new consolidation identified. Overall there is improved aeration of the left lower lung field since the prior study. The right lung is clear. There is no pleural effusion or pneumothorax. Stable cardiac silhouette. Median sternotomy wires and CABG vascular clips noted. Right pectoral Port-A-Cath with tip in stable positioning. No acute osseous pathology. IMPRESSION: Interval removal of the endotracheal and enteric tube. Stable appearing density in the left mid lung field with overall improvement of the aeration of the left lung. Electronically Signed   By: Anner Crete M.D.   On: 06/16/2015 19:54   Dg Chest Port 1 View  06/15/2015  CLINICAL DATA:  Acute respiratory failure EXAM: PORTABLE CHEST 1 VIEW COMPARISON:  Portable chest x-ray of 06/14/2015 FINDINGS: Aeration of the lungs has improved somewhat. The tip of the endotracheal tube appears to be approximately 3.8 cm above the carina.  Opacity in the left perihilar and left mid lung is unchanged. Cardiomegaly is stable. Right sided Port-A-Cath is unchanged in position, and right IJ central venous line is unchanged. IMPRESSION: Slightly better aeration. Endotracheal tube tip 3.8 cm above the carina. Electronically Signed   By: Ivar Drape M.D.   On: 06/15/2015 08:05   Dg Chest Port 1 View  06/14/2015  CLINICAL DATA:  Respiratory failure, history of coronary artery disease, GERD, diabetes, lung cancer EXAM: PORTABLE CHEST 1 VIEW COMPARISON:  06/13/2015 FINDINGS: Cardiomediastinal silhouette is stable. Status post median sternotomy. Persistent opacity left hilum and left perihilar region. Post radiation fibrosis left upper lobe again noted. Left basilar atelectasis. Stable endotracheal and NG tube position. Right IJ Port-A-Cath is unchanged in position. Stable right IJ central line. No pneumothorax. No pulmonary edema. IMPRESSION: Stable support apparatus. Persistent opacity left hilum and left perihilar region. Postradiation fibrosis in left upper lobe again noted. Left basilar atelectasis. No pulmonary edema. No pneumothorax. Status post median sternotomy. Electronically Signed   By: Lahoma Crocker M.D.   On: 06/14/2015 08:15   Dg Chest Port 1 View  06/13/2015  CLINICAL DATA:  75 year old male with endotracheal tube placement. EXAM: PORTABLE CHEST 1 VIEW COMPARISON:  Radiograph dated 06/13/2015 FINDINGS: Endotracheal tube with tip approximately 4 cm above the carina. An enteric tube is partially visualized coursing the left hemi abdomen with tip beyond the inferior margin of the image. There is a right IJ central line with tip over the mediastinum. Right pectoral infusion catheter is also noted. There is persistent opacity in the left mid lung field. Multiple surgical clips in the left lower lung field similar  to prior study. There is no pneumothorax. The right costophrenic angle has been excluded from the image. The cardiac border is silhouetted.  No acute osseous pathology identified. IMPRESSION: Endotracheal tube above the carina. Stable appearing postsurgical changes and persistent opacity in the left mid lung field. Electronically Signed   By: Anner Crete M.D.   On: 06/13/2015 21:05   Dg Chest Port 1 View  06/13/2015  CLINICAL DATA:  Respiratory failure and cough. History of left lung squamous carcinoma. EXAM: PORTABLE CHEST 1 VIEW COMPARISON:  05/27/2015 and prior radiographs.  05/03/2015 PET CT FINDINGS: Cardiomegaly, CABG changes and right Port-A-Cath with tip overlying the lower SVC again noted. Left mid lung opacity is stable to slightly increased in size There is no evidence of pneumothorax or pleural effusion. No other changes identified. IMPRESSION: Stable to slightly increased left lung opacity which may represent posttreatment changes. No other significant change. Electronically Signed   By: Margarette Canada M.D.   On: 06/13/2015 18:10   Dg Abd Portable 1v  06/13/2015  CLINICAL DATA:  Orogastric tube placement EXAM: PORTABLE ABDOMEN - 1 VIEW COMPARISON:  PET-CT May 03, 2015 FINDINGS: Orogastric tube tip and side port are in the stomach. Bowel gas pattern is unremarkable. No bowel dilatation or air-fluid level suggesting obstruction. No free air. Moderate stool is noted in the colon. IMPRESSION: Orogastric tube tip and side port in stomach. Bowel gas pattern unremarkable. No free air evident. Electronically Signed   By: Lowella Grip III M.D.   On: 06/13/2015 20:05   Dg Swallowing Func-speech Pathology  06/29/2015  Objective Swallowing Evaluation: Type of Study: MBS-Modified Barium Swallow Study Patient Details Name: BRAYDIN ALOI MRN: [id number] Date of Birth: July 06, 1940 Today's Date: 06/29/2015 Time: SLP Start Time (ACUTE ONLY): 0910-SLP Stop Time (ACUTE ONLY): 0930 SLP Time Calculation (min) (ACUTE ONLY): 20 min Past Medical History: Past Medical History Diagnosis Date . Coronary artery disease  . Pneumonia 2000 . Arthritis  .  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 Bruises easily  . GERD (gastroesophageal reflux disease)    takes Omeprazole daily as needed for stomach pain . Blood transfusion 2001 . Diabetes mellitus    takes Metformin daily; . Impaired hearing    bil hearing aide . Macular degeneration    being watched for this but hasn't been "completely" diagnosed . Myocardial infarction ([place]) 2001 . Cancer [facility])  Past Surgical History: Past Surgical History Procedure Laterality Date . Coronary artery bypass graft  2001   4 vessels . Cardiac catheterization  2001 . Tonsillectomy     as a child . Colonoscopy   . Cataract extraction     bilateral . Lumbar laminectomy/decompression microdiscectomy  12/19/2010   Procedure: LUMBAR LAMINECTOMY/DECOMPRESSION MICRODISCECTOMY;  Surgeon: Elaina Hoops;  Location: [place] [facility] OR[facility];  Service: Neurosurgery;  Laterality: N/A;  Lumbar three-four, four-five decompressive lumbar laminectomy . Joint replacement  right tkr . Eye surgery     cataracts . Back surgery     l4 5 . Portacath placement N/A 05/20/2014   Procedure: INSERTION PORT-A-CATH;  Surgeon: Nestor Lewandowsky, MD;  Location: [facility] ORS;  Service: General;  Laterality: N/A; HPI: 75 y.o. male patient with PMH: GERD, pna, CAD, DM, MI, cancer and chronic admitted with right-sided hemi-plegia. CT of the head revealed left frontal intracerebral hemorrhage, with subarachnoid hemorrhage as well adjacent to it. Intubated approximately 24-30 hours (extubated 6/6). 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  Objective swallow study ordered to determine safest diet consistencies due to change in function.  No Data Recorded Assessment / Plan / Recommendation CHL IP CLINICAL IMPRESSIONS 06/29/2015 Therapy Diagnosis Moderate oral dysphagia, Moderate pharyngeal dysphagia  Clinical Impression Pt presents with a moderate oropharyngeal dysphagia with both sensory and motor components.  As mentioned on bedside  swallow evaluation, pt presents with right sided labial, lingual, and buccal weakness which impacts his ability to contain and transit boluses effectively.  This in combination with decreased pharyngeal sensation result in premature spillage of materials into the pharynx with swallow response most consistently triggered at the pyriforms.  Delay in swallow response resulted in aspiration of thin liquids with delayed sensation.  Congested cough which occurred several seconds after aspiration event was ineffective for clearing aspirates from the airway.  Deep penetration was noted x1 over multiple boluses of nectar thick liquids which pt was able to sense and clear from the airway before materials passed below the vocal cords.  Poor base of tongue strength and decreased hyolaryngeal excursion also contributed to moderate pharyngeal residuals post swallow (vallecular residue > pyriforms).  Residue cleared to minimal-mild with cues for extra swallows. Recommend that pt initiate a Dys 1 diet with nectar thick liquids and full supervision for use of swallowing precautions.   Prognosis for advancement good with SLP intervention for pharyngeal strengthening exercises and management of safe diet progression.   Impact on safety and function Moderate aspiration risk     Prognosis 06/29/2015 Prognosis for Safe Diet Advancement Good Barriers to Reach Goals -- Barriers/Prognosis Comment -- CHL IP DIET RECOMMENDATION 06/29/2015 SLP Diet Recommendations Dysphagia 1 (Puree) solids;Nectar thick liquid Liquid Administration via Cup Medication Administration Crushed with puree Compensations Slow rate;Small sips/bites;Clear throat intermittently;Multiple dry swallows after each bite/sip Postural Changes --   No flowsheet data found.         CHL IP ORAL PHASE 06/29/2015 Oral Phase Impaired Oral - Pudding Teaspoon -- Oral - Pudding Cup -- Oral - Honey Teaspoon -- Oral - Honey Cup -- Oral - Nectar Teaspoon Weak lingual manipulation;Right  anterior bolus loss;Reduced posterior propulsion;Lingual/palatal residue;Delayed oral transit;Decreased bolus cohesion;Premature spillage Oral - Nectar Cup Right anterior bolus loss;Weak lingual manipulation;Lingual/palatal residue;Delayed oral transit;Decreased bolus cohesion;Premature spillage;Reduced posterior propulsion Oral - Nectar Straw Weak lingual manipulation;Lingual/palatal residue;Decreased bolus cohesion;Premature spillage Oral - Thin Teaspoon Right anterior bolus loss;Weak lingual manipulation;Reduced posterior propulsion;Lingual/palatal residue;Premature spillage;Delayed oral transit;Decreased bolus cohesion Oral - Thin Cup Weak lingual manipulation;Premature spillage;Lingual/palatal residue;Reduced posterior propulsion;Right anterior bolus loss;Delayed oral transit;Decreased bolus cohesion Oral - Thin Straw -- Oral - Puree Right anterior bolus loss;Weak lingual manipulation;Reduced posterior propulsion;Lingual/palatal residue;Delayed oral transit;Decreased bolus cohesion;Premature spillage Oral - Mech Soft -- Oral - Regular -- Oral - Multi-Consistency -- Oral - Pill -- Oral Phase - Comment --  CHL IP PHARYNGEAL PHASE 06/29/2015 Pharyngeal Phase Impaired Pharyngeal- Pudding Teaspoon -- Pharyngeal -- Pharyngeal- Pudding Cup -- Pharyngeal -- Pharyngeal- Honey Teaspoon -- Pharyngeal -- Pharyngeal- Honey Cup -- Pharyngeal -- Pharyngeal- Nectar Teaspoon Delayed swallow initiation-vallecula;Reduced anterior laryngeal mobility;Reduced laryngeal elevation;Reduced tongue base retraction;Reduced airway/laryngeal closure;Pharyngeal residue - posterior pharnyx;Pharyngeal residue - valleculae Pharyngeal -- Pharyngeal- Nectar Cup Delayed swallow initiation-pyriform sinuses;Reduced anterior laryngeal mobility;Reduced laryngeal elevation;Reduced airway/laryngeal closure;Reduced tongue base retraction;Pharyngeal residue - valleculae;Pharyngeal residue - posterior pharnyx;Penetration/Aspiration during swallow Pharyngeal  Material enters airway, CONTACTS cords and then ejected out Pharyngeal- Nectar Straw Reduced anterior laryngeal mobility;Reduced laryngeal elevation;Reduced airway/laryngeal closure;Reduced tongue base retraction;Pharyngeal residue - valleculae;Pharyngeal residue - posterior pharnyx;Pharyngeal residue - pyriform Pharyngeal --  Pharyngeal- Thin Teaspoon Delayed swallow initiation-vallecula;Reduced anterior laryngeal mobility;Reduced laryngeal elevation;Reduced airway/laryngeal closure;Reduced tongue base retraction;Pharyngeal residue - valleculae;Pharyngeal residue - pyriform;Penetration/Aspiration during swallow Pharyngeal Material enters airway, CONTACTS cords and then ejected out Pharyngeal- Thin Cup Delayed swallow initiation-pyriform sinuses;Reduced anterior laryngeal mobility;Reduced laryngeal elevation;Reduced airway/laryngeal closure;Reduced tongue base retraction;Penetration/Aspiration during swallow;Pharyngeal residue - valleculae;Pharyngeal residue - pyriform Pharyngeal Material enters airway, passes BELOW cords without attempt by patient to eject out (silent aspiration) Pharyngeal- Thin Straw -- Pharyngeal -- Pharyngeal- Puree Delayed swallow initiation-vallecula;Reduced anterior laryngeal mobility;Reduced laryngeal elevation;Reduced airway/laryngeal closure;Reduced tongue base retraction;Pharyngeal residue - valleculae;Pharyngeal residue - pyriform Pharyngeal -- Pharyngeal- Mechanical Soft -- Pharyngeal -- Pharyngeal- Regular -- Pharyngeal -- Pharyngeal- Multi-consistency -- Pharyngeal -- Pharyngeal- Pill -- Pharyngeal -- Pharyngeal Comment --  No flowsheet data found. No flowsheet data found. Page, [name] 06/29/2015, 3:21 PM                CBC  Recent Labs Lab 07/02/15 0645 07/03/15 0420 07/04/15 0504 07/04/15 1725 07/04/15 2100 07/05/15 0443  WBC 5.7 3.9* 4.2 4.1  --  5.4  HGB 8.1* 7.7* 7.3* 8.2* 9.0* 8.9*  HCT 25.5* 25.1* 24.3* 26.4* 28.9* 28.9*  PLT 236 214 201 199  --  193  MCV 87.6  86.3 87.1 87.7  --  85.5  MCH 27.8 26.5 26.2 27.2  --  26.3  MCHC 31.8 30.7 30.0 31.1  --  30.8  RDW 17.7* 17.8* 17.9* 17.6*  --  17.8*  LYMPHSABS  --   --   --   --   --  0.7  MONOABS  --   --   --   --   --  0.7  EOSABS  --   --   --   --   --  0.2  BASOSABS  --   --   --   --   --  0.0    Chemistries   Recent Labs Lab 06/29/15 0530 06/30/15 0706 07/01/15 0501 07/05/15 0443  NA 135 134* 135 134*  K 3.8 4.4 4.7 4.5  CL 104 105 106 105  CO2 22 20* 21* 22  GLUCOSE 173* 157* 146* 128*  BUN <5* <5* <5* <5*  CREATININE 1.09 1.20 1.14 1.14  CALCIUM 9.1 8.8* 9.3 9.6   ------------------------------------------------------------------------------------------------------------------ estimated creatinine clearance is 66.5 mL/min (by C-G formula based on Cr of 1.14). ------------------------------------------------------------------------------------------------------------------ No results for input(s): HGBA1C in the last 72 hours. ------------------------------------------------------------------------------------------------------------------ No results for input(s): CHOL, HDL, LDLCALC, TRIG, CHOLHDL, LDLDIRECT in the last 72 hours. ------------------------------------------------------------------------------------------------------------------ No results for input(s): TSH, T4TOTAL, T3FREE, THYROIDAB in the last 72 hours.  Invalid input(s): FREET3 ------------------------------------------------------------------------------------------------------------------  Recent Labs  07/04/15 1725  VITAMINB12 267  FERRITIN 21*  TIBC 340  IRON 26*    Coagulation profile No results for input(s): INR, PROTIME in the last 168 hours.  No results for input(s): DDIMER in the last 72 hours.  Cardiac Enzymes No results for input(s): CKMB, TROPONINI, MYOGLOBIN in the last 168 hours.  Invalid input(s):  CK ------------------------------------------------------------------------------------------------------------------ Invalid input(s): POCBNP   CBG:  Recent Labs Lab 07/04/15 0635 07/04/15 1155 07/04/15 1626 07/04/15 2043 07/05/15 0621  GLUCAP 122* 104* 120* 87 120*       Studies: Ct Abdomen Pelvis Wo Contrast  07/04/2015  CLINICAL DATA:  Anemia, history coronary disease post MI, diabetes mellitus, former smoker, stage III chronic kidney disease, LEFT lung cancer, atrial fibrillation EXAM: CT ABDOMEN AND PELVIS WITHOUT CONTRAST TECHNIQUE: Multidetector CT imaging of the abdomen and pelvis was  performed following the standard protocol without IV contrast. Sagittal and coronal MPR images reconstructed from axial data set. No oral contrast was administered. COMPARISON:  None. CT from PET-CT exam 05/03/2015 FINDINGS: Lower chest:  Bibasilar atelectasis. Hepatobiliary: Cholelithiasis. Beam hardening artifacts from patient's arms traverse liver, no gross hepatic abnormality seen. Pancreas: Atrophic, otherwise unremarkable Spleen: Traversing artifacts, grossly normal appearance Adrenals/Urinary Tract: Normal appearing adrenal glands. Cyst at inferior pole LEFT kidney again identified 3.4 x 3.6 cm image 49. Kidneys otherwise unremarkable without additional mass or hydronephrosis. Single nondilated ureters bilaterally. Normal appearing well distended urinary bladder. Stomach/Bowel: Normal appendix. Retained dense contrast within colon from recent swallowing study. Stomach and bowel loops otherwise normal appearance for technique. Vascular/Lymphatic: Atherosclerotic calcifications aorta, iliac, and femoral arteries. Few coronary arterial calcifications. Low-attenuation of circulating blood consistent with history of anemia. No adenopathy. Reproductive: N/A Other: Small BILATERAL inguinal hernias containing fat. No free air or free fluid. Musculoskeletal: Degenerative disc disease changes L4-L5. No acute  osseous findings. IMPRESSION: LEFT renal cyst. Cholelithiasis. Diffuse colonic diverticulosis without gross evidence acute diverticulitis. Small BILATERAL inguinal hernias containing fat. Aortic atherosclerosis. Electronically Signed   By: Lavonia Dana M.D.   On: 07/04/2015 12:55      Lab Results  Component Value Date   HGBA1C 6.8* 06/14/2015   HGBA1C 7.4* 04/17/2015   HGBA1C 8.0* 05/22/2013   Lab Results  Component Value Date   LDLCALC 145* 06/14/2015   CREATININE 1.14 07/05/2015       Scheduled Meds: . amiodarone  200 mg Oral Daily  . antiseptic oral rinse  7 mL Mouth Rinse BID  . atorvastatin  40 mg Oral q1800  . diltiazem  60 mg Oral BID  . glimepiride  1 mg Oral QAC breakfast  . insulin aspart  0-24 Units Subcutaneous TID AC & HS  . lactose free nutrition  237 mL Oral TID WC  . levETIRAcetam  1,000 mg Intravenous Q12H  . metFORMIN  1,000 mg Oral Q breakfast  . metFORMIN  500 mg Oral Q supper  . pantoprazole sodium  40 mg Oral Daily   Continuous Infusions: . dextrose 5 % and 0.45 % NaCl with KCl 20 mEq/L 125 mL/hr at 07/05/15 0410     LOS: 14 days    Time spent: >30 MINS    [facility]  [facility] Pager [phone]. If 7PM-7AM, please contact night-coverage at [email or web address], password [facility] 07/05/2015, 9:14 AM  LOS: 14 days

## 2015-07-05 NOTE — Progress Notes (Signed)
Nutrition Follow-up  DOCUMENTATION CODES:   Non-severe (moderate) malnutrition in context of chronic illness  INTERVENTION:  Continue Boost Plus po TID (thickened to nectar thick liquids), each supplement provides 360 kcal and 14 grams of protein.   Continue Ensure Enlive po TID PRN (thickened to nectar thick liquids), each supplement provides 350 kcal and 20 grams of protein.  Encourage adequate PO intake.   NUTRITION DIAGNOSIS:   Malnutrition related to chronic illness as evidenced by moderate depletions of muscle mass, moderate depletion of body fat, percent weight loss; ongoing  GOAL:   Patient will meet greater than or equal to 90% of their needs; met  MONITOR:   PO intake, Supplement acceptance, Weight trends, Labs, I & O's  REASON FOR ASSESSMENT:   Malnutrition Screening Tool    ASSESSMENT:   75 y.o. 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 CT of the head revealed left frontal intracerebral hemorrhage with subarachnoid hemorrhage. 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  Meal completion has been varied from 30-100% with most intake at 100%. Wife at bedside reports intake and swallowing has been improving. Pt currently has Boost ordered with varied intake. Wife reports thickening the supplements may be difficult at times. Wife reports she has been practicing on nectar thick consistencies. RD to continue to monitor.   Diet Order:  DIET - DYS 1 Room service appropriate?: Yes; Fluid consistency:: Nectar Thick  Skin:  Reviewed, no issues  Last BM:  6/23  Height:   Ht Readings from Last 1 Encounters:   06/13/15 6' (1.829 m)    Weight:   Wt Readings from Last 1 Encounters:  06/27/15 206 lb 8 oz (93.668 kg)    Ideal Body Weight:  80.9 kg  BMI:  Body mass index is 28 kg/(m^2).  Estimated Nutritional Needs:   Kcal:  2150-2350  Protein:  110-120 grams  Fluid:  2.1 - 2.3 L/day  EDUCATION NEEDS:   No education needs identified at this time  Corrin Parker, MS, RD, LDN Pager # [phone] After hours/ weekend pager # [phone]

## 2015-07-06 ENCOUNTER — Inpatient Hospital Stay (HOSPITAL_COMMUNITY): Payer: Medicare HMO | Admitting: Physical Therapy

## 2015-07-06 ENCOUNTER — Inpatient Hospital Stay (HOSPITAL_COMMUNITY): Payer: Medicare HMO | Admitting: Occupational Therapy

## 2015-07-06 ENCOUNTER — Inpatient Hospital Stay (HOSPITAL_COMMUNITY): Payer: Medicare HMO

## 2015-07-06 ENCOUNTER — Inpatient Hospital Stay (HOSPITAL_COMMUNITY): Payer: Medicare HMO | Admitting: Speech Pathology

## 2015-07-06 LAB — GLUCOSE, CAPILLARY
GLUCOSE-CAPILLARY: 146 mg/dL — AB (ref 65–99)
Glucose-Capillary: 157 mg/dL — ABNORMAL HIGH (ref 65–99)
Glucose-Capillary: 116 mg/dL — ABNORMAL HIGH (ref 65–99)
Glucose-Capillary: 117 mg/dL — ABNORMAL HIGH (ref 65–99)

## 2015-07-06 LAB — CBC
HEMATOCRIT: 28.4 % — AB (ref 39.0–52.0)
HEMOGLOBIN: 8.9 g/dL — AB (ref 13.0–17.0)
MCH: 26.7 pg (ref 26.0–34.0)
MCHC: 31.3 g/dL (ref 30.0–36.0)
MCV: 85.3 fL (ref 78.0–100.0)
Platelets: 193 10*3/uL (ref 150–400)
RBC: 3.33 MIL/uL — ABNORMAL LOW (ref 4.22–5.81)
RDW: 17.3 % — ABNORMAL HIGH (ref 11.5–15.5)
WBC: 5.7 10*3/uL (ref 4.0–10.5)

## 2015-07-06 MED ORDER — RIVAROXABAN 20 MG PO TABS: 20.0000 mg | ORAL_TABLET | Freq: Every day | ORAL | Status: DC

## 2015-07-06 MED ORDER — RIVAROXABAN 15 MG PO TABS
15.0000 mg | ORAL_TABLET | Freq: Every day | ORAL | Status: DC
  Administered 2015-07-06 – 2015-07-13 (×8): 15 mg via ORAL
  Filled 2015-07-06 (×8): qty 1

## 2015-07-06 MED ORDER — POLYSACCHARIDE IRON COMPLEX 150 MG PO CAPS
150.0000 mg | ORAL_CAPSULE | Freq: Every day | ORAL | Status: DC
  Administered 2015-07-06 – 2015-07-13 (×8): 150 mg via ORAL
  Filled 2015-07-06 (×9): qty 1

## 2015-07-06 NOTE — Progress Notes (Signed)
ANTICOAGULATION CONSULT NOTE - Follow Up Consult  Pharmacy Consult for xarelto Indication: atrial fibrillation  No Known Allergies  Patient Measurements: Weight: 206 lb 8 oz (93.668 kg)  Vital Signs: Temp: 97.9 F (36.6 C) (06/27 0624) Temp Source: Oral (06/27 0624) BP: 142/62 mmHg (06/27 0624) Pulse Rate: 94 (06/27 0624)  Labs:  Recent Labs  07/03/15 1758  07/04/15 0504  07/04/15 1725 07/04/15 2100 07/05/15 0443 07/06/15 0457  HGB  --   < > 7.3*  --  8.2* 9.0* 8.9* 8.9*  HCT  --   < > 24.3*  < > 26.4* 28.9* 28.9* 28.4*  PLT  --   < > 201  --  199  --  193 193  HEPARINUNFRC 0.28*  --  0.20*  --   --   --   --   --   CREATININE  --   --   --   --   --   --  1.14  --   < > = values in this interval not displayed.  Estimated Creatinine Clearance: 66.5 mL/min (by C-G formula based on Cr of 1.14).   Assessment: 63 YOM with recent ICH/SAH while on Eliquis, restarted anticoagulation with IV heparin on 6/19, since the ICH is nearly resolved. Heparin has been on hold since 6/25 d/t dropping hgb. Now hgb is stable at 8.9k. Pharmacy is consulted to start [place]. Current scr Provides a system wide list of pharmacist names for copying to other smart texts. Current scr 1.14, but pt with CKD-III, baseline scr 1.2-1.5? Reviewed previous labs, est. crcl mostly < 50 ml/min. Given recent ICH, and poor renal function will start on reduced dose 15 mg daily.    Goal of Therapy:  Monitor platelets by anticoagulation protocol: Yes   Plan:  - Xarelto 15 mg po daily with supper, first dose now. - Monitor cbc, s/sx of bleeding   Maryanna Shape, PharmD, BCPS  Clinical Pharmacist  Pager: [phone]   07/06/2015,12:13 PM

## 2015-07-06 NOTE — Progress Notes (Addendum)
Ivan Beasley  Ivan Beasley:[id number] DOB: 1940-03-08 DOA: 06/25/2015   PCP: Albina Billet, MD     Assessment/Plan: Principal Problem:   ICH (intracerebral hemorrhage) ([place])- L frontal ICH Active Problems:   Mild chronic obstructive pulmonary disease (HCC)   Seizures ([place]), possible   CKD (chronic kidney disease), stage III   Anemia   Right hemiparesis ([place])   Cerebrovascular accident (CVA) due to embolism of left middle cerebral artery (HCC)   Chronic anticoagulation   Malignant neoplasm of left lung (HCC)   Adjustment disorder with mixed anxiety and depressed mood   Ivan Beasley is a 75 y.o. 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 Consultation requested for anemia   Assessment and plan   Normocytic Anemia - anemia panel consistent with anemia of chronic disease, baseline hemoglobin has been around 8.2 ,?suspect iatrogenic from frequent blood draws, vs chronic disease/other deficiencies such as iron/b12/folate, hg improving , would limit blood draws to q 48 hrs  - CT abdomen and pelvis negative for  retroperitoneal bleed   Fecal occult blood negative  - orthostatic negative currently. -  Heparin drip has been discontinued due to acute drop in hemoglobin on 6/25,   resume xarelto  per pharmacy Discontinue heparin   for DVT prophylaxis once on Xarelto - given pt's CAD hx, would recd transfusion 1p rbc, for goal hb >8.  Status post 1 unit of packed red blood cells on 6/25, hemoglobin now 8.9 2  days, no active bleeding Vitamin B-12 is 267, marginally low, therefore started vitamin B-12 sublingually     2. Recent Right hemiplegia secondary to left MCA infarct adjacent to left frontal ICH with adjacent subarachnoid hemorrhage while on Eliquis for Atrial fib.  - Due to near resolution of ICH as well as has been >2 weeks, and high risk for recurrent embolic stroke off anticoagulation and relative small size of infarct, neurology Dr. Erlinda Hong  recommended on 6/20, to consider transition back to [name], wife okay with Xarelto, this can be started , continue to monitor hemoglobin  MRI with and without contrast 06/26/15 can not confirm or rule out metastasis at this time. As per neurology Still need to repeat MRI with and without contrast after blood all absorbed.    3. Atrial fib. - sounds PAF., Currently normal sinus rhythm EKG  shows normal sinus rhythm - recd holding hep gtt for now - CHADsVASc score at least 6, very high risk for recurrent strokes, will need to weigh risk/benefits of resuming anticoagulation after r/o bleeding risk. This was reviewed with the patient's wife was agreeable to start patient on Xarelto   4. htn - stable,   5. Dm 2 w/ neuropathy - controlled  6. Cad, sp cabg - in light of cardiac hx, will trx for Hb >8 at this time  7. 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 UTI enterobacter cloacae- Sensitive to ceftriaxone. Completed 7 day course We'll complete on  6/26  9.  Chronic nonproductive cough-last chest x-ray was on 6/23 with focal increased density in the right lung base,  will repeat chest x-ray, doubt pneumonia, continue incentive spirometry, aspiration precautions, reassess swallowing if needed. Also just completed treatment with vancomycin and Rocephin   DVT prophylaxsis : Heparin> Xarelto  Code Status:  Full code       Family Communication: Discussed in  detail with the [name], all imaging results, lab results explained to the patient   Disposition Plan: As per Charlett Blake, MD     Consultants:  [facility]  Procedures:   None  Antibiotics: Anti-infectives    Start     Dose/Rate Route Frequency Ordered Stop   06/28/15 1000  cefTRIAXone (ROCEPHIN) 1 g in dextrose 5 % 50 mL IVPB  Status:  Discontinued     1 g 100 mL/hr over 30 Minutes Intravenous Every 24 hours 06/28/15 0847 07/05/15 0834   06/27/15 2100  vancomycin (VANCOCIN) 1,500 mg in sodium chloride 0.9 % 500 mL IVPB  Status:  Discontinued     1,500 mg 250 mL/hr over 120 Minutes Intravenous Every 24 hours 06/26/15 2018 06/28/15 0843      HPI/Subjective: Continues to have a nonproductive cough, otherwise stable, afebrile  Objective: Filed Vitals:   07/04/15 1943 07/05/15 0300 07/05/15 2136 07/06/15 0624  BP: 137/68 160/65 139/68 142/62  Pulse: 94 88  94  Temp: 98.5 F (36.9 C) 97.8 F (36.6 C)  97.9 F (36.6 C)  TempSrc: Oral Oral  Oral  Resp: '16 18  18  '$ Weight:      SpO2: 97%   97%    Intake/Output Summary (Last 24 hours) at 07/06/15 0951 Last data filed at 07/06/15 0456  Gross per 24 hour  Intake     10 ml  Output      0 ml  Net     10 ml    Exam:  Examination:  General exam: Appears calm and comfortable  Respiratory system: Clear to auscultation. Respiratory effort normal. Cardiovascular system: S1 & S2 heard, RRR. No JVD, murmurs, rubs, gallops or clicks. No pedal edema. Gastrointestinal system: Abdomen is nondistended, soft and nontender. No organomegaly or masses felt. Normal bowel sounds heard. Central nervous system: Alert and oriented. Right upper extremity weakness Extremities: Symmetric 5 x 5 power. Skin: No rashes, lesions or ulcers Psychiatry: Judgement and insight appear normal. Mood & affect appropriate.     Data Reviewed: I have personally reviewed following labs and imaging studies  Micro Results Recent Results (from the  past 240 hour(s))  Culture, Urine     Status: Abnormal   Collection Time: 06/26/15  5:17 PM  Result Value Ref Range Status   Specimen Description URINE, CATHETERIZED  Final   Special Requests NONE  Final   Culture >=100,000 COLONIES/mL ENTEROBACTER CLOACAE (A)  Final   Report Status 06/28/2015 FINAL  Final   Organism ID, Bacteria ENTEROBACTER CLOACAE (A)  Final      Susceptibility   Enterobacter cloacae - MIC*    CEFAZOLIN >=64 RESISTANT Resistant     CEFTRIAXONE 8 SENSITIVE Sensitive     CIPROFLOXACIN <=0.25 SENSITIVE Sensitive     GENTAMICIN <=1 SENSITIVE Sensitive     IMIPENEM <=0.25 SENSITIVE Sensitive     NITROFURANTOIN <=16 SENSITIVE Sensitive     TRIMETH/SULFA <=20 SENSITIVE Sensitive     PIP/TAZO 32 INTERMEDIATE Intermediate     * >=100,000 COLONIES/mL ENTEROBACTER CLOACAE  Culture, blood (routine x 2)     Status: None   Collection Time: 06/26/15  6:06 PM  Result Value Ref Range Status   Specimen Description BLOOD  LEFT ANTECUBITAL  Final   Special Requests BOTTLES DRAWN AEROBIC AND ANAEROBIC 5CC  Final   Culture NO GROWTH 5 DAYS  Final   Report Status 07/01/2015 FINAL  Final  Culture, blood (routine x 2)     Status: None   Collection Time: 06/26/15  6:41 PM  Result Value Ref Range Status   Specimen Description BLOOD LEFT HAND  Final   Special Requests BOTTLES DRAWN AEROBIC AND ANAEROBIC 5CC  Final   Culture NO GROWTH 5 DAYS  Final   Report Status 07/01/2015 FINAL  Final    Radiology Reports Ct Abdomen Pelvis Wo Contrast  07/04/2015  CLINICAL DATA:  Anemia, history coronary disease post MI, diabetes mellitus, former smoker, stage III chronic kidney disease, LEFT lung cancer, atrial fibrillation EXAM: CT ABDOMEN AND PELVIS WITHOUT CONTRAST TECHNIQUE: Multidetector CT imaging of the abdomen and pelvis was performed following the standard protocol without IV contrast. Sagittal and coronal MPR images reconstructed from axial data set. No oral contrast was administered.  COMPARISON:  None. CT from PET-CT exam 05/03/2015 FINDINGS: Lower chest:  Bibasilar atelectasis. Hepatobiliary: Cholelithiasis. Beam hardening artifacts from patient's arms traverse liver, no gross hepatic abnormality seen. Pancreas: Atrophic, otherwise unremarkable Spleen: Traversing artifacts, grossly normal appearance Adrenals/Urinary Tract: Normal appearing adrenal glands. Cyst at inferior pole LEFT kidney again identified 3.4 x 3.6 cm image 49. Kidneys otherwise unremarkable without additional mass or hydronephrosis. Single nondilated ureters bilaterally. Normal appearing well distended urinary bladder. Stomach/Bowel: Normal appendix. Retained dense contrast within colon from recent swallowing study. Stomach and bowel loops otherwise normal appearance for technique. Vascular/Lymphatic: Atherosclerotic calcifications aorta, iliac, and femoral arteries. Few coronary arterial calcifications. Low-attenuation of circulating blood consistent with history of anemia. No adenopathy. Reproductive: N/A Other: Small BILATERAL inguinal hernias containing fat. No free air or free fluid. Musculoskeletal: Degenerative disc disease changes L4-L5. No acute osseous findings. IMPRESSION: LEFT renal cyst. Cholelithiasis. Diffuse colonic diverticulosis without gross evidence acute diverticulitis. Small BILATERAL inguinal hernias containing fat. Aortic atherosclerosis. Electronically Signed   By: Lavonia Dana M.D.   On: 07/04/2015 12:55   Dg Chest 2 View  07/02/2015  CLINICAL DATA:  Persistent cough, COPD, lung malignancy. Patient cyst has sustained an intracranial hemorrhage with right hemi [place] is EXAM: CHEST  2 VIEW COMPARISON:  Chest x-ray of June 26, 2015 FINDINGS: The right lung is adequately inflated and exhibits increased density in the infrahilar region. There is some overlap here of the Port-A-Cath reservoir. On the left there is persistent increased density in the perihilar region extending toward the lateral pleural  surface. The heart is top-normal in size. There are post CABG changes. Three upper sternal wires are chronically broken. There is calcification in the wall of the aortic arch. There is no pleural effusion or pneumothorax. The Port-A-Cath appliance tip projects over the midportion of the SVC. IMPRESSION: 1. Subtle increased density at the right lung base suggests subsegmental atelectasis. Stable parenchymal density in the left mid lung and left perihilar region. 2. Stable post CABG changes.  No evidence of CHF. Electronically Signed   By: David  Martinique M.D.   On: 07/02/2015 09:40   Dg Chest 2 View  06/26/2015  CLINICAL DATA:  C/o chronic cough. 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 Rales heard in right lower lung. 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 EXAM: CHEST  2 VIEW COMPARISON:  06/25/2015 FINDINGS: There stable changes from prior CABG surgery. Cardiac silhouette is mildly enlarged. Focal irregular opacity  in the posterior perihilar region of the left lung is stable. There are no new areas of lung opacification. No pleural effusion or pneumothorax. Right anterior chest wall Port-A-Cath is stable. Bony thorax is intact. IMPRESSION: 1. No acute cardiopulmonary disease. 2. No change from the prior study. Persistent focal opacity in the perihilar region of the left lung consistent with the reported squamous cell lung carcinoma. Electronically Signed   By: Lajean Manes M.D.   On: 06/26/2015 16:36   Dg Chest 2 View  06/25/2015  CLINICAL DATA:  Cough, left-sided weakness EXAM: CHEST  2 VIEW COMPARISON:  07/05/2015 FINDINGS: Cardiomediastinal silhouette is stable. Right IJ Port-A-Cath is unchanged in position. Persistent left upper lobe consolidation. Bony thorax is unremarkable. IMPRESSION: No significant change. Persistent left upper lobe consolidation. Stable right IJ Port-A-Cath position. Electronically Signed   By: Lahoma Crocker M.D.   On: 06/25/2015 16:54    Ct Head Wo Contrast  06/26/2015  CLINICAL DATA:  Slurred speech. EXAM: CT HEAD WITHOUT CONTRAST TECHNIQUE: Contiguous axial images were obtained from the base of the skull through the vertex without intravenous contrast. COMPARISON:  MRI of June 14, 2015.  CT scan of June 13, 2015. FINDINGS: Bony calvarium appears intact.  Sphenoid sinusitis is noted. Mild diffuse cortical atrophy is noted. There is continued presence of left superior parietal intraparenchymal hemorrhage which now measures 21 x 11 mm and is slightly decreased in size compared to prior exam. Subarachnoid hemorrhage has resolved. There is continued surrounding vasogenic edema. No significant midline shift is noted. Ventricular size is unremarkable. IMPRESSION: Mild diffuse cortical atrophy. Continued presence of left parietal intraparenchymal hemorrhage with surrounding vasogenic edema. This is improved compared to prior exam. Subarachnoid hemorrhage is no longer present. Electronically Signed   By: Marijo Conception, M.D.   On: 06/26/2015 14:29   Ct Head Wo Contrast  06/13/2015  CLINICAL DATA:  The woke at 5 a.m. with right-sided weakness. EXAM: CT HEAD WITHOUT CONTRAST TECHNIQUE: Contiguous axial images were obtained from the base of the skull through the vertex without intravenous contrast. COMPARISON:  None. FINDINGS: There is intra cerebral hemorrhage noted in the posterior left frontal lobe. Subarachnoid extension. Hemorrhage measures 2.4 x 2.2 x 1.6 cm for a volume of 4.2 mL. Mild surrounding vasogenic edema. No significant mass effect. No midline shift. No hydrocephalus. No acute calvarial abnormality. Air-fluid level in the left sphenoid sinus. Mastoid air cells are clear. IMPRESSION: Left posterior frontal intra cerebral hemorrhage with subarachnoid extension as described above. Critical Value/emergent results were called by telephone at the time of interpretation on 06/13/2015 at 10:02 am to Dr. Loura Pardon , who verbally acknowledged  these results. Electronically Signed   By: Rolm Baptise M.D.   On: 06/13/2015 10:04   Mr Jodene Nam Head Wo Contrast  06/15/2015  ADDENDUM REPORT: 06/15/2015 08:11 ADDENDUM: With the rounded contour of the enhancing component of the left frontal lobe hematoma, follow-up until clearance recommended to exclude underlying vascular abnormality. Electronically Signed   By: Genia Del M.D.   On: 06/15/2015 08:11  06/15/2015  CLINICAL DATA:  75 year old diabetic male with history of lung cancer. Right-sided weakness. Subsequent encounter. EXAM: MRI HEAD WITHOUT CONTRAST MRA HEAD WITHOUT CONTRAST TECHNIQUE: Multiplanar, multiecho pulse sequences of the brain and surrounding structures were obtained without intravenous contrast. Angiographic images of the head were obtained using MRA technique without contrast. COMPARISON:  06/13/2015 head CT.  07/28/2014 brain MR. FINDINGS: MRI HEAD FINDINGS Posterior left frontal lobe complex 2.5 x 2.2 x  2 cm hemorrhagic lesion with marked surrounding vasogenic edema with breakthrough into sulci with subarachnoid hemorrhage noted. Patient's history of lung cancer in addition to 6 mm rounded area of enhanced along the posterior margin of this hemorrhage suggests that this is most likely is related to a hemorrhagic metastatic lesion which has bled. Continued MR surveillance as hemorrhage clears is recommended. There may be a a second enhancing lesion within the anterior left parietal lobe with surrounding vasogenic edema. Venous infarct can have a similar appearance. The major dural sinuses appear patent. No acute thrombotic infarct separate from the above described findings. Remote small left parietal lobe, left frontal lobe, posterior left opercular and tiny right thalamic infarct. Mild small vessel disease changes. Moderate global atrophy without hydrocephalus. Opacification left sphenoid sinus with air-fluid level suggesting acute sinusitis. Mild mucosal thickening ethmoid sinus air cells  and minimal mucosal thickening frontal sinuses. Bilateral mastoid air cell and middle ear opacification greater on the left without obstructing lesion of the eustachian tube noted. Post lens replacement without acute orbital abnormality noted. MRA HEAD FINDINGS MR angiogram does not incorporate the left frontal lobe hemorrhage. Mild narrowing supraclinoid aspect internal carotid artery bilaterally with irregularity more notable on the left. Small infundibulum on the left posterior communicating artery level. Anterior circulation without medium or large size vessel significant stenosis or occlusion. Middle cerebral artery branch vessel irregularity bilaterally. Right vertebral artery ends in a posterior inferior cerebellar artery distribution. No significant narrowing left vertebral artery. Moderate narrowing portions of the left posterior inferior cerebellar artery. Ectatic basilar artery without high-grade stenosis. Nonvisualized anterior inferior cerebellar arteries. Small left superior cerebellar artery. Posterior cerebral artery distal branch vessel narrowing bilaterally. IMPRESSION: MRI HEAD Posterior left frontal lobe complex 2.5 x 2.2 x 2 cm hemorrhagic lesion with marked surrounding vasogenic edema with breakthrough into sulci with subarachnoid hemorrhage noted. Patient's history of lung cancer in addition to 6 mm rounded area of enhanced along the posterior margin of this hemorrhage suggests that this is most likely is related to a hemorrhagic metastatic lesion which has bled. Continued MR surveillance as hemorrhage clears is recommended. There may be a a second enhancing lesion within the anterior left parietal lobe with surrounding vasogenic edema. Venous infarct can have a similar appearance. The major dural sinuses appear patent. Opacification left sphenoid sinus with air-fluid level suggesting acute sinusitis. Bilateral mastoid air cell and middle ear opacification greater on the left without  obstructing lesion of the eustachian tube noted. MRA HEAD MR angiogram does not incorporate the left frontal lobe hemorrhage. Mild narrowing supraclinoid aspect internal carotid artery bilaterally with irregularity more notable on the left. Small infundibulum on the left posterior communicating artery level. Anterior circulation without medium or large size vessel significant stenosis or occlusion. Middle cerebral artery branch vessel irregularity bilaterally. Right vertebral artery ends in a posterior inferior cerebellar artery distribution. No significant narrowing left vertebral artery. Moderate narrowing portions of the left posterior inferior cerebellar artery. Ectatic basilar artery without high-grade stenosis. Nonvisualized anterior inferior cerebellar arteries. Small left superior cerebellar artery. Posterior cerebral artery distal branch vessel narrowing bilaterally. Electronically Signed: By: Genia Del M.D. On: 06/14/2015 19:34   [name] WP Contrast  06/26/2015  CLINICAL DATA:  Initial evaluation for progressively garbled and slurred speech, with increased right-sided facial droop. EXAM: MRI HEAD WITHOUT AND WITH CONTRAST TECHNIQUE: Multiplanar, multiecho pulse sequences of the brain and surrounding structures were obtained without and with intravenous contrast. CONTRAST:  51m MULTIHANCE GADOBENATE DIMEGLUMINE 529 MG/ML IV  SOLN COMPARISON:  Prior CT from earlier the same day as well as previous MRI from 06/14/2015. FINDINGS: Generalized cerebral atrophy with minimal chronic small vessel ischemic changes again noted, stable. Small remote lacunar infarct within the right thalamus. Previously identified posterior left frontal lobe complex hemorrhagic lesion again seen, overall likely decreased in size from previous. Lesion measures 13 x 21 x 16 mm on T2 weighted sequences. Surrounding vasogenic edema is similar. Breakthrough subarachnoid hemorrhage within the adjacent cortical sulci again seen,  improved. There is increased peripheral enhancement about the margin of the hemorrhagic lesion, likely related to the evolving hematoma. Previously noted small hemorrhagic nodule is not as clearly delineated on this exam. Beasley again made of a second possible enhancing lesion within the anterior left parietal lobe along the dura, measuring approximately 9 x 11 x 11 mm. Surrounding vasogenic edema. No other new enhancing lesions. Immediately lateral and inferior to the dominant hemorrhagic lesion. There is a focus of abnormal restricted diffusion involving the cortical gray matter and underlying white matter of the posterior left frontal region, compatible with acute ischemic infarct (series 4, image 35). Associated signal loss on ADC map. This appears to be more vascular distribution rather than seizure related. Additional small sub cm focus of restricted diffusion more posteriorly within the left centrum semi ovale. No significant mass effect. Area of infarction closely approximates the hemorrhagic lesion can subarachnoid hemorrhage, but does not appear to be hemorrhagic in of itself. No other acute infarct. Gray-white matter differentiation otherwise maintained. Major intracranial vascular flow voids are preserved. No midline shift. No hydrocephalus. No extra-axial fluid collection. Again, major dural sinuses appear to be grossly patent. Craniocervical junction within normal limits. Visualized upper cervical spine unremarkable. Pituitary gland within normal limits. No acute abnormality about the globes and orbits. Patient is status post lens extraction bilaterally. Mucosal thickening with fluid level within the left sphenoid sinus, similar to prior. Mild scattered opacity within the anterior ethmoidal air cells. Bilateral mastoid effusions present. Visualize bone marrow within normal limits. Scalp soft tissues demonstrate no acute abnormality. IMPRESSION: 1. Acute ischemic left MCA territory infarct adjacent to the  posterior left frontal hemorrhagic lesion. Vasospasm as an underlying etiology is suspected given the adjacent complex hemorrhagic lesion and subarachnoid hemorrhage. 2. Continued interval evolution of complex left frontal lobe hemorrhagic lesion with similar vasogenic edema. Associated breakthrough subarachnoid hemorrhage is improved. This lesion now demonstrates peripheral enhancement, which would be expected with an evolving hematoma. Previously noted small nodular focus of enhancement not well delineated on this exam. Again, continued MR surveillance at the hemorrhage clears is recommended to ensure no underlying lesion is present. 3. Additional 9 x 11 x 11 mm enhancing lesion within the anterior left parietal lobe, similar to prior. Attention at follow-up recommended. 4. Acute left sphenoid sinus disease with bilateral mastoid effusions, similar to prior. Electronically Signed   By: Jeannine Boga M.D.   On: 06/26/2015 23:30   [name] XL Contrast  06/15/2015  ADDENDUM REPORT: 06/15/2015 08:11 ADDENDUM: With the rounded contour of the enhancing component of the left frontal lobe hematoma, follow-up until clearance recommended to exclude underlying vascular abnormality. Electronically Signed   By: Genia Del M.D.   On: 06/15/2015 08:11  06/15/2015  CLINICAL DATA:  75 year old diabetic male with history of lung cancer. Right-sided weakness. Subsequent encounter. EXAM: MRI HEAD WITHOUT CONTRAST MRA HEAD WITHOUT CONTRAST TECHNIQUE: Multiplanar, multiecho pulse sequences of the brain and surrounding structures were obtained without intravenous contrast. Angiographic images of the  head were obtained using MRA technique without contrast. COMPARISON:  06/13/2015 head CT.  07/28/2014 brain MR. FINDINGS: MRI HEAD FINDINGS Posterior left frontal lobe complex 2.5 x 2.2 x 2 cm hemorrhagic lesion with marked surrounding vasogenic edema with breakthrough into sulci with subarachnoid hemorrhage noted. Patient's  history of lung cancer in addition to 6 mm rounded area of enhanced along the posterior margin of this hemorrhage suggests that this is most likely is related to a hemorrhagic metastatic lesion which has bled. Continued MR surveillance as hemorrhage clears is recommended. There may be a a second enhancing lesion within the anterior left parietal lobe with surrounding vasogenic edema. Venous infarct can have a similar appearance. The major dural sinuses appear patent. No acute thrombotic infarct separate from the above described findings. Remote small left parietal lobe, left frontal lobe, posterior left opercular and tiny right thalamic infarct. Mild small vessel disease changes. Moderate global atrophy without hydrocephalus. Opacification left sphenoid sinus with air-fluid level suggesting acute sinusitis. Mild mucosal thickening ethmoid sinus air cells and minimal mucosal thickening frontal sinuses. Bilateral mastoid air cell and middle ear opacification greater on the left without obstructing lesion of the eustachian tube noted. Post lens replacement without acute orbital abnormality noted. MRA HEAD FINDINGS MR angiogram does not incorporate the left frontal lobe hemorrhage. Mild narrowing supraclinoid aspect internal carotid artery bilaterally with irregularity more notable on the left. Small infundibulum on the left posterior communicating artery level. Anterior circulation without medium or large size vessel significant stenosis or occlusion. Middle cerebral artery branch vessel irregularity bilaterally. Right vertebral artery ends in a posterior inferior cerebellar artery distribution. No significant narrowing left vertebral artery. Moderate narrowing portions of the left posterior inferior cerebellar artery. Ectatic basilar artery without high-grade stenosis. Nonvisualized anterior inferior cerebellar arteries. Small left superior cerebellar artery. Posterior cerebral artery distal branch vessel narrowing  bilaterally. IMPRESSION: MRI HEAD Posterior left frontal lobe complex 2.5 x 2.2 x 2 cm hemorrhagic lesion with marked surrounding vasogenic edema with breakthrough into sulci with subarachnoid hemorrhage noted. Patient's history of lung cancer in addition to 6 mm rounded area of enhanced along the posterior margin of this hemorrhage suggests that this is most likely is related to a hemorrhagic metastatic lesion which has bled. Continued MR surveillance as hemorrhage clears is recommended. There may be a a second enhancing lesion within the anterior left parietal lobe with surrounding vasogenic edema. Venous infarct can have a similar appearance. The major dural sinuses appear patent. Opacification left sphenoid sinus with air-fluid level suggesting acute sinusitis. Bilateral mastoid air cell and middle ear opacification greater on the left without obstructing lesion of the eustachian tube noted. MRA HEAD MR angiogram does not incorporate the left frontal lobe hemorrhage. Mild narrowing supraclinoid aspect internal carotid artery bilaterally with irregularity more notable on the left. Small infundibulum on the left posterior communicating artery level. Anterior circulation without medium or large size vessel significant stenosis or occlusion. Middle cerebral artery branch vessel irregularity bilaterally. Right vertebral artery ends in a posterior inferior cerebellar artery distribution. No significant narrowing left vertebral artery. Moderate narrowing portions of the left posterior inferior cerebellar artery. Ectatic basilar artery without high-grade stenosis. Nonvisualized anterior inferior cerebellar arteries. Small left superior cerebellar artery. Posterior cerebral artery distal branch vessel narrowing bilaterally. Electronically Signed: By: Genia Del M.D. On: 06/14/2015 19:34   Dg Chest Port 1 View  06/26/2015  CLINICAL DATA:  Cough and congestion. EXAM: PORTABLE CHEST 1 VIEW COMPARISON:  June 18, 2015  FINDINGS: There is  persistent airspace consolidation in the left upper lobe with volume loss. Lungs elsewhere clear. Heart is upper normal in size with pulmonary vascularity within normal limits. Port-A-Cath tip is in the superior vena cava near the cavoatrial junction, stable. No pneumothorax. No adenopathy evident. Patient is status post coronary artery bypass grafting. IMPRESSION: Persistent left upper lobe airspace consolidation with volume loss. No change from 3 days prior. Stable cardiac silhouette. Electronically Signed   By: Lowella Grip III M.D.   On: 06/24/2015 07:25   Dg Chest Port 1 View  06/18/2015  CLINICAL DATA:  Cough EXAM: PORTABLE CHEST 1 VIEW COMPARISON:  06/16/2015 FINDINGS: Stable right jugular Port-A-Cath. Low volumes with bibasilar atelectasis. Left mid lung dense pulmonary opacity is stable. No pneumothorax. IMPRESSION: Stable left mid lung dense pulmonary opacity. Bibasilar atelectasis. Electronically Signed   By: Marybelle Killings M.D.   On: 06/18/2015 15:16   Dg Chest Port 1 View  06/16/2015  CLINICAL DATA:  75 year old male with lung cancer. No chest complaint at this time. EXAM: PORTABLE CHEST 1 VIEW COMPARISON:  Chest radiograph dated 06/15/2015 FINDINGS: There has been interval removal of the endotracheal and enteric tube. The stable area of density is again noted in the left mid lung field. Multiple surgical clips arms in this area. No new consolidation identified. Overall there is improved aeration of the left lower lung field since the prior study. The right lung is clear. There is no pleural effusion or pneumothorax. Stable cardiac silhouette. Median sternotomy wires and CABG vascular clips noted. Right pectoral Port-A-Cath with tip in stable positioning. No acute osseous pathology. IMPRESSION: Interval removal of the endotracheal and enteric tube. Stable appearing density in the left mid lung field with overall improvement of the aeration of the left lung. Electronically  Signed   By: Anner Crete M.D.   On: 06/16/2015 19:54   Dg Chest Port 1 View  06/15/2015  CLINICAL DATA:  Acute respiratory failure EXAM: PORTABLE CHEST 1 VIEW COMPARISON:  Portable chest x-ray of 06/14/2015 FINDINGS: Aeration of the lungs has improved somewhat. The tip of the endotracheal tube appears to be approximately 3.8 cm above the carina. Opacity in the left perihilar and left mid lung is unchanged. Cardiomegaly is stable. Right sided Port-A-Cath is unchanged in position, and right IJ central venous line is unchanged. IMPRESSION: Slightly better aeration. Endotracheal tube tip 3.8 cm above the carina. Electronically Signed   By: Ivar Drape M.D.   On: 06/15/2015 08:05   Dg Chest Port 1 View  06/14/2015  CLINICAL DATA:  Respiratory failure, history of coronary artery disease, GERD, diabetes, lung cancer EXAM: PORTABLE CHEST 1 VIEW COMPARISON:  06/13/2015 FINDINGS: Cardiomediastinal silhouette is stable. Status post median sternotomy. Persistent opacity left hilum and left perihilar region. Post radiation fibrosis left upper lobe again noted. Left basilar atelectasis. Stable endotracheal and NG tube position. Right IJ Port-A-Cath is unchanged in position. Stable right IJ central line. No pneumothorax. No pulmonary edema. IMPRESSION: Stable support apparatus. Persistent opacity left hilum and left perihilar region. Postradiation fibrosis in left upper lobe again noted. Left basilar atelectasis. No pulmonary edema. No pneumothorax. Status post median sternotomy. Electronically Signed   By: Lahoma Crocker M.D.   On: 06/14/2015 08:15   Dg Chest Port 1 View  06/13/2015  CLINICAL DATA:  74 year old male with endotracheal tube placement. EXAM: PORTABLE CHEST 1 VIEW COMPARISON:  Radiograph dated 06/13/2015 FINDINGS: Endotracheal tube with tip approximately 4 cm above the carina. An enteric tube is partially visualized coursing the  left hemi abdomen with tip beyond the inferior margin of the image. There is a  right IJ central line with tip over the mediastinum. Right pectoral infusion catheter is also noted. There is persistent opacity in the left mid lung field. Multiple surgical clips in the left lower lung field similar to prior study. There is no pneumothorax. The right costophrenic angle has been excluded from the image. The cardiac border is silhouetted. No acute osseous pathology identified. IMPRESSION: Endotracheal tube above the carina. Stable appearing postsurgical changes and persistent opacity in the left mid lung field. Electronically Signed   By: Anner Crete M.D.   On: 06/13/2015 21:05   Dg Chest Port 1 View  06/13/2015  CLINICAL DATA:  Respiratory failure and cough. History of left lung squamous carcinoma. EXAM: PORTABLE CHEST 1 VIEW COMPARISON:  05/27/2015 and prior radiographs.  05/03/2015 PET CT FINDINGS: Cardiomegaly, CABG changes and right Port-A-Cath with tip overlying the lower SVC again noted. Left mid lung opacity is stable to slightly increased in size There is no evidence of pneumothorax or pleural effusion. No other changes identified. IMPRESSION: Stable to slightly increased left lung opacity which may represent posttreatment changes. No other significant change. Electronically Signed   By: Margarette Canada M.D.   On: 06/13/2015 18:10   Dg Abd Portable 1v  06/13/2015  CLINICAL DATA:  Orogastric tube placement EXAM: PORTABLE ABDOMEN - 1 VIEW COMPARISON:  PET-CT May 03, 2015 FINDINGS: Orogastric tube tip and side port are in the stomach. Bowel gas pattern is unremarkable. No bowel dilatation or air-fluid level suggesting obstruction. No free air. Moderate stool is noted in the colon. IMPRESSION: Orogastric tube tip and side port in stomach. Bowel gas pattern unremarkable. No free air evident. Electronically Signed   By: Lowella Grip III M.D.   On: 06/13/2015 20:05   Dg Swallowing Func-speech Pathology  06/29/2015  Objective Swallowing Evaluation: Type of Study: MBS-Modified Barium  Swallow Study Patient Details Name: Ivan Beasley MRN: [id number] Date of Birth: 02-21-1940 Today's Date: 06/29/2015 Time: SLP Start Time (ACUTE ONLY): 0910-SLP Stop Time (ACUTE ONLY): 0930 SLP Time Calculation (min) (ACUTE ONLY): 20 min Past Medical History: Past Medical History Diagnosis Date . Coronary artery disease  . Pneumonia 2000 . Arthritis  . 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 Bruises easily  . GERD (gastroesophageal reflux disease)    takes Omeprazole daily as needed for stomach pain . Blood transfusion 2001 . Diabetes mellitus    takes Metformin daily; . Impaired hearing    bil hearing aide . Macular degeneration    being watched for this but hasn't been "completely" diagnosed . Myocardial infarction ([place]) 2001 . Cancer [facility])  Past Surgical History: Past Surgical History Procedure Laterality Date . Coronary artery bypass graft  2001   4 vessels . Cardiac catheterization  2001 . Tonsillectomy     as a child . Colonoscopy   . Cataract extraction     bilateral . Lumbar laminectomy/decompression microdiscectomy  12/19/2010   Procedure: LUMBAR LAMINECTOMY/DECOMPRESSION MICRODISCECTOMY;  Surgeon: Elaina Hoops;  Location: [facility];  Service: Neurosurgery;  Laterality: N/A;  Lumbar three-four, four-five decompressive lumbar laminectomy . Joint replacement  right tkr . Eye surgery     cataracts . Back surgery     l4 5 . Portacath placement N/A 05/20/2014   Procedure: INSERTION PORT-A-CATH;  Surgeon: Nestor Lewandowsky, MD;  Location: [facility] ORS;  Service: General;  Laterality: N/A; HPI: 75 y.o. male patient with PMH:  GERD, pna, CAD, DM, MI, cancer and chronic admitted with right-sided hemi-plegia. CT of the head revealed left frontal intracerebral hemorrhage, with subarachnoid hemorrhage as well adjacent to it. Intubated approximately 24-30 hours (extubated 6/6). 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  Objective swallow study ordered to determine  safest diet consistencies due to change in function.  No Data Recorded Assessment / Plan / Recommendation CHL IP CLINICAL IMPRESSIONS 06/29/2015 Therapy Diagnosis Moderate oral dysphagia, Moderate pharyngeal dysphagia  Clinical Impression Pt presents with a moderate oropharyngeal dysphagia with both sensory and motor components.  As mentioned on bedside swallow evaluation, pt presents with right sided labial, lingual, and buccal weakness which impacts his ability to contain and transit boluses effectively.  This in combination with decreased pharyngeal sensation result in premature spillage of materials into the pharynx with swallow response most consistently triggered at the pyriforms.  Delay in swallow response resulted in aspiration of thin liquids with delayed sensation.  Congested cough which occurred several seconds after aspiration event was ineffective for clearing aspirates from the airway.  Deep penetration was noted x1 over multiple boluses of nectar thick liquids which pt was able to sense and clear from the airway before materials passed below the vocal cords.  Poor base of tongue strength and decreased hyolaryngeal excursion also contributed to moderate pharyngeal residuals post swallow (vallecular residue > pyriforms).  Residue cleared to minimal-mild with cues for extra swallows. Recommend that pt initiate a Dys 1 diet with nectar thick liquids and full supervision for use of swallowing precautions.   Prognosis for advancement good with SLP intervention for pharyngeal strengthening exercises and management of safe diet progression.   Impact on safety and function Moderate aspiration risk     Prognosis 06/29/2015 Prognosis for Safe Diet Advancement Good Barriers to Reach Goals -- Barriers/Prognosis Comment -- CHL IP DIET RECOMMENDATION 06/29/2015 SLP Diet Recommendations Dysphagia 1 (Puree) solids;Nectar thick liquid Liquid Administration via Cup Medication Administration Crushed with puree Compensations  Slow rate;Small sips/bites;Clear throat intermittently;Multiple dry swallows after each bite/sip Postural Changes --   No flowsheet data found.         CHL IP ORAL PHASE 06/29/2015 Oral Phase Impaired Oral - Pudding Teaspoon -- Oral - Pudding Cup -- Oral - Honey Teaspoon -- Oral - Honey Cup -- Oral - Nectar Teaspoon Weak lingual manipulation;Right anterior bolus loss;Reduced posterior propulsion;Lingual/palatal residue;Delayed oral transit;Decreased bolus cohesion;Premature spillage Oral - Nectar Cup Right anterior bolus loss;Weak lingual manipulation;Lingual/palatal residue;Delayed oral transit;Decreased bolus cohesion;Premature spillage;Reduced posterior propulsion Oral - Nectar Straw Weak lingual manipulation;Lingual/palatal residue;Decreased bolus cohesion;Premature spillage Oral - Thin Teaspoon Right anterior bolus loss;Weak lingual manipulation;Reduced posterior propulsion;Lingual/palatal residue;Premature spillage;Delayed oral transit;Decreased bolus cohesion Oral - Thin Cup Weak lingual manipulation;Premature spillage;Lingual/palatal residue;Reduced posterior propulsion;Right anterior bolus loss;Delayed oral transit;Decreased bolus cohesion Oral - Thin Straw -- Oral - Puree Right anterior bolus loss;Weak lingual manipulation;Reduced posterior propulsion;Lingual/palatal residue;Delayed oral transit;Decreased bolus cohesion;Premature spillage Oral - Mech Soft -- Oral - Regular -- Oral - Multi-Consistency -- Oral - Pill -- Oral Phase - Comment --  CHL IP PHARYNGEAL PHASE 06/29/2015 Pharyngeal Phase Impaired Pharyngeal- Pudding Teaspoon -- Pharyngeal -- Pharyngeal- Pudding Cup -- Pharyngeal -- Pharyngeal- Honey Teaspoon -- Pharyngeal -- Pharyngeal- Honey Cup -- Pharyngeal -- Pharyngeal- Nectar Teaspoon Delayed swallow initiation-vallecula;Reduced anterior laryngeal mobility;Reduced laryngeal elevation;Reduced tongue base retraction;Reduced airway/laryngeal closure;Pharyngeal residue - posterior pharnyx;Pharyngeal  residue - valleculae Pharyngeal -- Pharyngeal- Nectar Cup Delayed swallow initiation-pyriform sinuses;Reduced anterior laryngeal mobility;Reduced  laryngeal elevation;Reduced airway/laryngeal closure;Reduced tongue base retraction;Pharyngeal residue - valleculae;Pharyngeal residue - posterior pharnyx;Penetration/Aspiration during swallow Pharyngeal Material enters airway, CONTACTS cords and then ejected out Pharyngeal- Nectar Straw Reduced anterior laryngeal mobility;Reduced laryngeal elevation;Reduced airway/laryngeal closure;Reduced tongue base retraction;Pharyngeal residue - valleculae;Pharyngeal residue - posterior pharnyx;Pharyngeal residue - pyriform Pharyngeal -- Pharyngeal- Thin Teaspoon Delayed swallow initiation-vallecula;Reduced anterior laryngeal mobility;Reduced laryngeal elevation;Reduced airway/laryngeal closure;Reduced tongue base retraction;Pharyngeal residue - valleculae;Pharyngeal residue - pyriform;Penetration/Aspiration during swallow Pharyngeal Material enters airway, CONTACTS cords and then ejected out Pharyngeal- Thin Cup Delayed swallow initiation-pyriform sinuses;Reduced anterior laryngeal mobility;Reduced laryngeal elevation;Reduced airway/laryngeal closure;Reduced tongue base retraction;Penetration/Aspiration during swallow;Pharyngeal residue - valleculae;Pharyngeal residue - pyriform Pharyngeal Material enters airway, passes BELOW cords without attempt by patient to eject out (silent aspiration) Pharyngeal- Thin Straw -- Pharyngeal -- Pharyngeal- Puree Delayed swallow initiation-vallecula;Reduced anterior laryngeal mobility;Reduced laryngeal elevation;Reduced airway/laryngeal closure;Reduced tongue base retraction;Pharyngeal residue - valleculae;Pharyngeal residue - pyriform Pharyngeal -- Pharyngeal- Mechanical Soft -- Pharyngeal -- Pharyngeal- Regular -- Pharyngeal -- Pharyngeal- Multi-consistency -- Pharyngeal -- Pharyngeal- Pill -- Pharyngeal -- Pharyngeal Comment --  No flowsheet data  found. No flowsheet data found. Page, [name] 06/29/2015, 3:21 PM                CBC  Recent Labs Lab 07/03/15 0420 07/04/15 0504 07/04/15 1415 07/04/15 1725 07/04/15 2100 07/05/15 0443 07/06/15 0457  WBC 3.9* 4.2  --  4.1  --  5.4 5.7  HGB 7.7* 7.3*  --  8.2* 9.0* 8.9* 8.9*  HCT 25.1* 24.3* 24.1* 26.4* 28.9* 28.9* 28.4*  PLT 214 201  --  199  --  193 193  MCV 86.3 87.1  --  87.7  --  85.5 85.3  MCH 26.5 26.2  --  27.2  --  26.3 26.7  MCHC 30.7 30.0  --  31.1  --  30.8 31.3  RDW 17.8* 17.9*  --  17.6*  --  17.8* 17.3*  LYMPHSABS  --   --   --   --   --  0.7  --   MONOABS  --   --   --   --   --  0.7  --   EOSABS  --   --   --   --   --  0.2  --   BASOSABS  --   --   --   --   --  0.0  --     Chemistries   Recent Labs Lab 06/30/15 0706 07/01/15 0501 07/05/15 0443  NA 134* 135 134*  K 4.4 4.7 4.5  CL 105 106 105  CO2 20* 21* 22  GLUCOSE 157* 146* 128*  BUN <5* <5* <5*  CREATININE 1.20 1.14 1.14  CALCIUM 8.8* 9.3 9.6   ------------------------------------------------------------------------------------------------------------------ estimated creatinine clearance is 66.5 mL/min (by C-G formula based on Cr of 1.14). ------------------------------------------------------------------------------------------------------------------ No results for input(s): HGBA1C in the last 72 hours. ------------------------------------------------------------------------------------------------------------------ No results for input(s): CHOL, HDL, LDLCALC, TRIG, CHOLHDL, LDLDIRECT in the last 72 hours. ------------------------------------------------------------------------------------------------------------------ No results for input(s): TSH, T4TOTAL, T3FREE, THYROIDAB in the last 72 hours.  Invalid input(s): FREET3 ------------------------------------------------------------------------------------------------------------------  Recent Labs  07/04/15 1725  VITAMINB12 267   FERRITIN 21*  TIBC 340  IRON 26*    Coagulation profile No results for input(s): INR, PROTIME in the last 168 hours.  No results for input(s): DDIMER in the last 72 hours.  Cardiac Enzymes No results for input(s): CKMB, TROPONINI, MYOGLOBIN in the last 168 hours.  Invalid input(s): CK ------------------------------------------------------------------------------------------------------------------ Invalid input(s): POCBNP  CBG:  Recent Labs Lab 07/05/15 0621 07/05/15 1159 07/05/15 1720 07/05/15 2117 07/06/15 0603  GLUCAP 120* 120* 146* 132* 157*       Studies: Ct Abdomen Pelvis Wo Contrast  07/04/2015  CLINICAL DATA:  Anemia, history coronary disease post MI, diabetes mellitus, former smoker, stage III chronic kidney disease, LEFT lung cancer, atrial fibrillation EXAM: CT ABDOMEN AND PELVIS WITHOUT CONTRAST TECHNIQUE: Multidetector CT imaging of the abdomen and pelvis was performed following the standard protocol without IV contrast. Sagittal and coronal MPR images reconstructed from axial data set. No oral contrast was administered. COMPARISON:  None. CT from PET-CT exam 05/03/2015 FINDINGS: Lower chest:  Bibasilar atelectasis. Hepatobiliary: Cholelithiasis. Beam hardening artifacts from patient's arms traverse liver, no gross hepatic abnormality seen. Pancreas: Atrophic, otherwise unremarkable Spleen: Traversing artifacts, grossly normal appearance Adrenals/Urinary Tract: Normal appearing adrenal glands. Cyst at inferior pole LEFT kidney again identified 3.4 x 3.6 cm image 49. Kidneys otherwise unremarkable without additional mass or hydronephrosis. Single nondilated ureters bilaterally. Normal appearing well distended urinary bladder. Stomach/Bowel: Normal appendix. Retained dense contrast within colon from recent swallowing study. Stomach and bowel loops otherwise normal appearance for technique. Vascular/Lymphatic: Atherosclerotic calcifications aorta, iliac, and femoral  arteries. Few coronary arterial calcifications. Low-attenuation of circulating blood consistent with history of anemia. No adenopathy. Reproductive: N/A Other: Small BILATERAL inguinal hernias containing fat. No free air or free fluid. Musculoskeletal: Degenerative disc disease changes L4-L5. No acute osseous findings. IMPRESSION: LEFT renal cyst. Cholelithiasis. Diffuse colonic diverticulosis without gross evidence acute diverticulitis. Small BILATERAL inguinal hernias containing fat. Aortic atherosclerosis. Electronically Signed   By: Lavonia Dana M.D.   On: 07/04/2015 12:55      Lab Results  Component Value Date   HGBA1C 6.8* 06/14/2015   HGBA1C 7.4* 04/17/2015   HGBA1C 8.0* 05/22/2013   Lab Results  Component Value Date   LDLCALC 145* 06/14/2015   CREATININE 1.14 07/05/2015       Scheduled Meds: . amiodarone  200 mg Oral Daily  . antiseptic oral rinse  7 mL Mouth Rinse BID  . atorvastatin  40 mg Oral q1800  . diltiazem  60 mg Oral BID  . glimepiride  1 mg Oral QAC breakfast  . heparin subcutaneous  5,000 Units Subcutaneous Q8H  . insulin aspart  0-24 Units Subcutaneous TID AC & HS  . iron polysaccharides  150 mg Oral Daily  . lactose free nutrition  237 mL Oral TID WC  . levETIRAcetam  1,000 mg Intravenous Q12H  . metFORMIN  1,000 mg Oral Q breakfast  . metFORMIN  500 mg Oral Q supper  . pantoprazole sodium  40 mg Oral Daily  . vitamin B-12  1,000 mcg Oral Daily   Continuous Infusions: . dextrose 5 % and 0.45 % NaCl with KCl 20 mEq/L 125 mL/hr at 07/06/15 0719     LOS: 15 days    Time spent: >30 MINS    [facility]  [facility] Pager [phone]. If 7PM-7AM, please contact night-coverage at [email or web address], password [facility] 07/06/2015, 9:51 AM  LOS: 15 days

## 2015-07-06 NOTE — Progress Notes (Signed)
Speech Language Pathology Weekly Progress and Session Note  Patient Details  Name: Ivan Beasley MRN: [id number] Date of Birth: Oct 27, 1940  Beginning of progress report period: June 29, 2015  End of progress report period:  July 06, 2015   Today's Date: 07/06/2015 SLP Individual Time: 1345-1440 SLP Individual Time Calculation (min): 55 min  Short Term Goals: Week 2: SLP Short Term Goal 1 (Week 2): Pt will be intelligible in phrases with mod assist verbal cues for overarticulation, increased vocal intensity,  SLP Short Term Goal 1 - Progress (Week 2): Met SLP Short Term Goal 2 (Week 2): Pt will consume Dys 1 textures and nectar thick liquids with min assist verbal cues for use of swallowing precautions and minimal overt s/s of aspiration.  SLP Short Term Goal 2 - Progress (Week 2): Met SLP Short Term Goal 3 (Week 2): Pt will consume therapeutic trials of thin liquids via teaspoon with min assist verbal cues for use of swallowing precautions and minimal overt s/s of aspiration.  SLP Short Term Goal 3 - Progress (Week 2): Other (comment) (not addressed for pt safety ) SLP Short Term Goal 4 (Week 2): Pt will return demonstration of pharyngeal strengthening exercises for 25 repetitions each with min assist verbal cues.   SLP Short Term Goal 4 - Progress (Week 2): Progressing toward goal SLP Short Term Goal 5 (Week 2): Pt will sustain his attention to basic tasks for 2-3 minute intervals with min verbal cues for redirection.   SLP Short Term Goal 5 - Progress (Week 2): Met SLP Short Term Goal 6 (Week 2): Pt will return demonstration of IMST and RMST devices with supervision verbal cues at 9 cm H2O and 15 cm H2O respectively with effort level rated <7/10 over three consecutive sessions.  SLP Short Term Goal 6 - Progress (Week 2): Progressing toward goal    New Short Term Goals: Week 3: SLP Short Term Goal 1 (Week 3): Pt will be intelligible in phrases with min assist verbal cues for  overarticulation, increased vocal intensity,  SLP Short Term Goal 2 (Week 3): Pt will consume Dys 1 textures and nectar thick liquids with supervision verbal cues for use of swallowing precautions and minimal overt s/s of aspiration.  SLP Short Term Goal 3 (Week 3): Pt will consume therapeutic trials of thin liquids via teaspoon with min assist verbal cues for use of swallowing precautions and minimal overt s/s of aspiration.  SLP Short Term Goal 4 (Week 3): Pt will return demonstration of pharyngeal strengthening exercises for 25 repetitions each with min assist verbal cues.   SLP Short Term Goal 5 (Week 3): Pt will sustain his attention to basic tasks for 5 minute intervals with min verbal cues for redirection.   SLP Short Term Goal 6 (Week 3): Pt will return demonstration of IMST and RMST devices with supervision verbal cues at 9 cm H2O and 15 cm H2O respectively with effort level rated <7/10 over three consecutive sessions.   Weekly Progress Updates:  Pt has made slow functional gains this reporting period and has met 3 out of 6 short term goals.  Pt is consuming dys 1 textures and nectar thick liquids with min assist use of swallowing precautions.  Pt has also demonstrated improved speech intelligibility over the course of this reporting period, although intelligibility tends to fluctuate with fatigue.  Pt remains limited by significant debility and impairment s/p multiple CVAs.  Therapist has implemented respiratory muscle training program to facilitate improved cough strength,  breath support for speech, and overall endurance.  Pt would continue to benefit from skilled ST while inpatient in order to maximize functional independence and reduce burden of care prior to discharge.  Recommend 24/7 supervision at discharge in addition to [place] follow up at next level of care.  Pt and family education is ongoing.     Intensity: Minumum of 1-2 x/day, 30 to 90 minutes Frequency: 3 to 5 out of 7  days Duration/Length of Stay: 24-27 days  Treatment/Interventions: Cognitive remediation/compensation;Cueing hierarchy;Environmental controls;Functional tasks;Internal/external aids;Medication managment;Multimodal communication approach;Patient/family education;Speech/Language facilitation;Therapeutic Exercise   Daily Session  Skilled Therapeutic Interventions: Pt was seen for skilled ST targeting goals for dysphagia and cognition.  Pt consumed nectar thick sprite with min assist verbal cues for use of swallowing precautions.  No overt s/s of aspiration evident with cup sips of liquids.  Therapist facilitated the session with a basic, novel table top game targeting sustained attention to task.  Pt required max assist verbal cues for recall of task goals and procedures; however, he was able to attend to task for ~2 minute intervals with min verbal cues for redirection.  Pt was able to return demonstration of IMST trainer set at 14 cm H2O with min verbal and tactile cues for 25 repetitions; however, progressive fatigue prevented pt from being able to return demonstration of EMST trainer, even when set at 8 cm H2O and with max assist multimodal cues for sequencing and labial seal.  Pt was left in wheelchair at the end of today's therapy session with call bell within reach.  Wife at bedside.  Goals updated on this date to reflect current progress and plan of care.         Function:   Eating Eating   Modified Consistency Diet: Yes Eating Assist Level: Help managing cup/glass;Supervision or verbal cues           Cognition Comprehension Comprehension assist level: Understands basic 75 - 89% of the time/ requires cueing 10 - 24% of the time  Expression   Expression assist level: Expresses basic 50 - 74% of the time/requires cueing 25 - 49% of the time. Needs to repeat parts of sentences.  Social Interaction Social Interaction assist level: Interacts appropriately 50 - 74% of the time - May be  physically or verbally inappropriate.  Problem Solving Problem solving assist level: Solves basic 25 - 49% of the time - needs direction more than half the time to initiate, plan or complete simple activities  Memory Memory assist level: Recognizes or recalls 25 - 49% of the time/requires cueing 50 - 75% of the time   General    Pain Pain Assessment Pain Assessment: No/denies pain  Therapy/Group: Individual Therapy  Sagar Tengan, [name] 07/06/2015, 4:44 PM

## 2015-07-06 NOTE — Progress Notes (Signed)
Subjective/Complaints: Appreciate internal medicine note Discussed workup with patient and wife today. Participating in Therapy Review of systems aphasic limiting review Objective: Vital Signs: Blood pressure 117/64, pulse 99, temperature 97.7 F (36.5 C), temperature source Oral, resp. rate 18, weight 93.668 kg (206 lb 8 oz), SpO2 99 %. Dg Chest Port 1 View  07/06/2015  CLINICAL DATA:  COUGH.HX MI,CA EXAM: PORTABLE CHEST 1 VIEW COMPARISON:  07/02/2015 FINDINGS: Heart size is enlarged. Right-sided Port-A-Cath tip to the level of the lower superior vena cava. There is patchy density in the left upper lobe, stable compared with prior study. Mild interstitial edema is noted. IMPRESSION: 1. Persistent left upper lobe density. 2. Mild interstitial edema. Electronically Signed   By: Nolon Nations M.D.   On: 07/06/2015 14:09   Results for orders placed or performed during the hospital encounter of 06/16/2015 (from the past 72 hour(s))  Heparin level (unfractionated)     Status: Abnormal   Collection Time: 07/03/15  5:58 PM  Result Value Ref Range   Heparin Unfractionated 0.28 (L) 0.30 - 0.70 IU/mL    Comment:        IF HEPARIN RESULTS ARE BELOW EXPECTED VALUES, AND PATIENT DOSAGE HAS BEEN CONFIRMED, SUGGEST FOLLOW UP TESTING OF ANTITHROMBIN III LEVELS.   Glucose, capillary     Status: Abnormal   Collection Time: 07/03/15  9:21 PM  Result Value Ref Range   Glucose-Capillary 131 (H) 65 - 99 mg/dL  CBC     Status: Abnormal   Collection Time: 07/04/15  5:04 AM  Result Value Ref Range   WBC 4.2 4.0 - 10.5 K/uL   RBC 2.79 (L) 4.22 - 5.81 MIL/uL   Hemoglobin 7.3 (L) 13.0 - 17.0 g/dL   HCT 24.3 (L) 39.0 - 52.0 %   MCV 87.1 78.0 - 100.0 fL   MCH 26.2 26.0 - 34.0 pg   MCHC 30.0 30.0 - 36.0 g/dL   RDW 17.9 (H) 11.5 - 15.5 %   Platelets 201 150 - 400 K/uL  Heparin level (unfractionated)     Status: Abnormal   Collection Time: 07/04/15  5:04 AM  Result Value Ref Range   Heparin  Unfractionated 0.20 (L) 0.30 - 0.70 IU/mL    Comment:        IF HEPARIN RESULTS ARE BELOW EXPECTED VALUES, AND PATIENT DOSAGE HAS BEEN CONFIRMED, SUGGEST FOLLOW UP TESTING OF ANTITHROMBIN III LEVELS.   Glucose, capillary     Status: Abnormal   Collection Time: 07/04/15  6:35 AM  Result Value Ref Range   Glucose-Capillary 122 (H) 65 - 99 mg/dL  Glucose, capillary     Status: Abnormal   Collection Time: 07/04/15 11:55 AM  Result Value Ref Range   Glucose-Capillary 104 (H) 65 - 99 mg/dL  Occult blood card to lab, stool Provider will collect     Status: None   Collection Time: 07/04/15 12:14 PM  Result Value Ref Range   Fecal Occult Bld NEGATIVE NEGATIVE  Prepare RBC     Status: None   Collection Time: 07/04/15 12:55 PM  Result Value Ref Range   Order Confirmation ORDER PROCESSED BY BLOOD BANK   Type and screen [place]     Status: None   Collection Time: 07/04/15  2:15 PM  Result Value Ref Range   ABO/RH(D) O NEG    Antibody Screen NEG    Sample Expiration 07/07/2015    Unit Number [id number]    Blood Component Type RED CELLS,LR  Unit division 00    Status of Unit ISSUED,FINAL    Transfusion Status OK TO TRANSFUSE    Crossmatch Result Compatible   Folate RBC     Status: Abnormal   Collection Time: 07/04/15  2:15 PM  Result Value Ref Range   Folate, Hemolysate 273.8 Not Estab. ng/mL   Hematocrit 24.1 (L) 37.5 - 51.0 %   Folate, RBC 1136 >498 ng/mL    Comment: (NOTE) Performed At: [facility] [place], [place] [id number] Lindon Romp MD NU:[id number]   ABO/Rh     Status: None   Collection Time: 07/04/15  2:15 PM  Result Value Ref Range   ABO/RH(D) O NEG   Glucose, capillary     Status: Abnormal   Collection Time: 07/04/15  4:26 PM  Result Value Ref Range   Glucose-Capillary 120 (H) 65 - 99 mg/dL  CBC     Status: Abnormal   Collection Time: 07/04/15  5:25 PM  Result Value Ref Range   WBC 4.1 4.0 - 10.5 K/uL   RBC  3.01 (L) 4.22 - 5.81 MIL/uL   Hemoglobin 8.2 (L) 13.0 - 17.0 g/dL   HCT 26.4 (L) 39.0 - 52.0 %   MCV 87.7 78.0 - 100.0 fL   MCH 27.2 26.0 - 34.0 pg   MCHC 31.1 30.0 - 36.0 g/dL   RDW 17.6 (H) 11.5 - 15.5 %   Platelets 199 150 - 400 K/uL  Iron and TIBC     Status: Abnormal   Collection Time: 07/04/15  5:25 PM  Result Value Ref Range   Iron 26 (L) 45 - 182 ug/dL   TIBC 340 250 - 450 ug/dL   Saturation Ratios 8 (L) 17.9 - 39.5 %   UIBC 314 ug/dL  Ferritin     Status: Abnormal   Collection Time: 07/04/15  5:25 PM  Result Value Ref Range   Ferritin 21 (L) 24 - 336 ng/mL  Vitamin B12     Status: None   Collection Time: 07/04/15  5:25 PM  Result Value Ref Range   Vitamin B-12 267 180 - 914 pg/mL    Comment: (NOTE) This assay is not validated for testing neonatal or myeloproliferative syndrome specimens for Vitamin B12 levels.   Glucose, capillary     Status: None   Collection Time: 07/04/15  8:43 PM  Result Value Ref Range   Glucose-Capillary 87 65 - 99 mg/dL  Hemoglobin and hematocrit, blood     Status: Abnormal   Collection Time: 07/04/15  9:00 PM  Result Value Ref Range   Hemoglobin 9.0 (L) 13.0 - 17.0 g/dL   HCT 28.9 (L) 39.0 - 52.0 %  CBC with Differential/Platelet     Status: Abnormal   Collection Time: 07/05/15  4:43 AM  Result Value Ref Range   WBC 5.4 4.0 - 10.5 K/uL   RBC 3.38 (L) 4.22 - 5.81 MIL/uL   Hemoglobin 8.9 (L) 13.0 - 17.0 g/dL   HCT 28.9 (L) 39.0 - 52.0 %   MCV 85.5 78.0 - 100.0 fL   MCH 26.3 26.0 - 34.0 pg   MCHC 30.8 30.0 - 36.0 g/dL   RDW 17.8 (H) 11.5 - 15.5 %   Platelets 193 150 - 400 K/uL   Neutrophils Relative % 71 %   Neutro Abs 3.8 1.7 - 7.7 K/uL   Lymphocytes Relative 13 %   Lymphs Abs 0.7 0.7 - 4.0 K/uL   Monocytes Relative 12 %   Monocytes Absolute 0.7  0.1 - 1.0 K/uL   Eosinophils Relative 4 %   Eosinophils Absolute 0.2 0.0 - 0.7 K/uL   Basophils Relative 0 %   Basophils Absolute 0.0 0.0 - 0.1 K/uL  Basic metabolic panel     Status:  Abnormal   Collection Time: 07/05/15  4:43 AM  Result Value Ref Range   Sodium 134 (L) 135 - 145 mmol/L   Potassium 4.5 3.5 - 5.1 mmol/L   Chloride 105 101 - 111 mmol/L   CO2 22 22 - 32 mmol/L   Glucose, Bld 128 (H) 65 - 99 mg/dL   BUN <5 (L) 6 - 20 mg/dL   Creatinine, Ser 1.14 0.61 - 1.24 mg/dL   Calcium 9.6 8.9 - 10.3 mg/dL   GFR calc non Af Amer >60 >60 mL/min   GFR calc Af Amer >60 >60 mL/min    Comment: (NOTE) The eGFR has been calculated using the CKD EPI equation. This calculation has not been validated in all clinical situations. eGFR's persistently <60 mL/min signify possible Chronic Kidney Disease.    Anion gap 7 5 - 15  Glucose, capillary     Status: Abnormal   Collection Time: 07/05/15  6:21 AM  Result Value Ref Range   Glucose-Capillary 120 (H) 65 - 99 mg/dL  Glucose, capillary     Status: Abnormal   Collection Time: 07/05/15 11:59 AM  Result Value Ref Range   Glucose-Capillary 120 (H) 65 - 99 mg/dL  Glucose, capillary     Status: Abnormal   Collection Time: 07/05/15  5:20 PM  Result Value Ref Range   Glucose-Capillary 146 (H) 65 - 99 mg/dL  Glucose, capillary     Status: Abnormal   Collection Time: 07/05/15  9:17 PM  Result Value Ref Range   Glucose-Capillary 132 (H) 65 - 99 mg/dL  CBC     Status: Abnormal   Collection Time: 07/06/15  4:57 AM  Result Value Ref Range   WBC 5.7 4.0 - 10.5 K/uL   RBC 3.33 (L) 4.22 - 5.81 MIL/uL   Hemoglobin 8.9 (L) 13.0 - 17.0 g/dL   HCT 28.4 (L) 39.0 - 52.0 %   MCV 85.3 78.0 - 100.0 fL   MCH 26.7 26.0 - 34.0 pg   MCHC 31.3 30.0 - 36.0 g/dL   RDW 17.3 (H) 11.5 - 15.5 %   Platelets 193 150 - 400 K/uL  Glucose, capillary     Status: Abnormal   Collection Time: 07/06/15  6:03 AM  Result Value Ref Range   Glucose-Capillary 157 (H) 65 - 99 mg/dL  Glucose, capillary     Status: Abnormal   Collection Time: 07/06/15 12:18 PM  Result Value Ref Range   Glucose-Capillary 146 (H) 65 - 99 mg/dL  Glucose, capillary     Status:  Abnormal   Collection Time: 07/06/15  4:35 PM  Result Value Ref Range   Glucose-Capillary 116 (H) 65 - 99 mg/dL     HEENT: normal Cardio: irregular irregular Resp: CTA B/L and unlabored GI: BS positive and nontender nondistended Extremity:  No Edema Skin:   Intact Neuro: Flat, Abnormal Sensory cannot assess secondary to aphasia, Abnormal Motor 2 minus finger flexors, biceps and triceps , 0 at deltoid, 3 minus at the hip knee extensors as well as plantar flexors. 0 at the ankle dorsiflexors., Dysarthric and Aphasic, Mild facial droop Musc/Skel:  Normal Gen. no acute distress   Assessment/Plan: 1. 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 Physiatrist is providing close team supervision and 24 hour management of active medical problems listed below. Physiatrist and rehab team continue to assess barriers to discharge/monitor patient progress toward functional and medical goals. FIM: Function - Bathing Position: Bed Body parts bathed by patient: Chest, Abdomen, Right arm, Front perineal area Body parts bathed by helper: Back, Buttocks Bathing not applicable: Right upper leg, Left upper leg, Right lower leg, Left lower leg Assist Level: 2 helpers (Total assist)  Function- Upper Body Dressing/Undressing What is the patient wearing?: Pull over shirt/dress Pull over shirt/dress - Perfomed by patient: Thread/unthread left sleeve, Put head through opening Pull over shirt/dress - Perfomed by helper: Pull shirt over trunk, Thread/unthread right sleeve Assist Level: 2 helpers (Max assist, +2 for sitting balance) Function - Lower Body Dressing/Undressing What is the patient wearing?: Pants, Non-skid slipper socks Position: Bed Pants- Performed by helper: Thread/unthread right pants leg, Thread/unthread left pants leg, Pull pants up/down Non-skid  slipper socks- Performed by helper: Don/doff right sock, Don/doff left sock Assist for footwear: Dependant Assist for lower body dressing:  (Total assist)  Function - Toileting Toileting activity did not occur: No continent bowel/bladder event Assist level: Two helpers (per [employer], NT)  Function - Air cabin crew transfer activity did not occur: Safety/medical concerns Assist level to toilet: 2 helpers (per Berkley Harvey, NT) Assist level from toilet: 2 helpers  Function - Chair/bed transfer Chair/bed transfer activity did not occur: Safety/medical concerns Chair/bed transfer method: Lateral scoot Chair/bed transfer assist level: Maximal assist (Pt 25 - 49%/lift and lower) Chair/bed transfer assistive device: Armrests, Sliding board Mechanical lift: Stedy Chair/bed transfer details: Verbal cues for precautions/safety, Manual facilitation for weight shifting, Manual facilitation for placement, Verbal cues for sequencing, Verbal cues for technique, Tactile cues for placement, Tactile cues for posture, Tactile cues for weight shifting  Function - Locomotion: Wheelchair Will patient use wheelchair at discharge?: Yes Type: Manual Max wheelchair distance: 75 Assist Level: Maximal assistance (Pt 25 - 49%) Assist Level: Maximal assistance (Pt 25 - 49%) Wheel 150 feet activity did not occur: Safety/medical concerns Turns around,maneuvers to table,bed, and toilet,negotiates 3% grade,maneuvers on rugs and over doorsills: No Function - Locomotion: Ambulation Ambulation activity did not occur: Safety/medical concerns Assistive device: Other (comment) (three muskateers) Max distance: 25 Assist level: 2 helpers Assist level: 2 helpers  Function - Comprehension Comprehension: Auditory Comprehension assistive device: Hearing aids Comprehension assist level: Understands basic 75 - 89% of the time/ requires cueing 10 - 24% of the time  Function - Expression Expression:  Verbal Expression assist level: Expresses basic 50 - 74% of the time/requires cueing 25 - 49% of the time. Needs to repeat parts of sentences.  Function - Social Interaction Social Interaction assist level: Interacts appropriately 50 - 74% of the time - May be physically or verbally inappropriate.  Function - Problem Solving Problem solving assist level: Solves basic 25 - 49% of the time - needs direction more than half the time to initiate, plan or complete simple activities  Function - Memory Memory assist level: Recognizes or recalls 25 - 49% of the time/requires cueing 50 - 75% of the time Patient normally able to recall (first 3 days only): Current season    Medical Problem List and Plan: 1.  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 Plan to follow-up MRI of the brain after ICH absorbed for further evaluation of possible  metastases posterior left frontal lobe. Team conference in the morning Plan to repeat MRI next week prior to discharge with neurology to follow up, Xarelto for stroke prophylaxis           2.  DVT Prophylaxis/Anticoagulation: SCDs. Monitor for signs of DVT, Xarelto should help with this as well 3. Pain Management: Ultram and oxycodone as needed 4. Mood: Xanax 0.25 mg 3 times daily as needed 5. Neuropsych: This patient is not capable of making decisions on his own behalf with caregiver/staff assistance. 6. Skin/Wound Care: Routine skin checks 7. Fluids/Electrolytes/Nutrition: Routine I&O's with follow-up chemistries,severe swallowing difficulties, increased cough.  At this point I do not think it is a sign of neurologic decline 8. Seizure prophylaxis. Keppra now at 1000 mg twice a day---more alert. EEG displayed seizure activity 9. Atrial fibrillation. Cardiac rate control. Continue amiodarone 200 mg daily, Cardizem 60 mg twice a day.Eliquis held secondary to [place], family does not want to resume, would be comfortable with Xarelto  ,Will ask pharmacy to initiate low-dose tomorrow if hemoglobin remains stable Filed Vitals:   07/06/15 0624 07/06/15 1454  BP: 142/62 117/64  Pulse: 94 99  Temp: 97.9 F (36.6 C) 97.7 F (36.5 C)  Resp: 18 18   10. 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 This is likely the etiology of the persistent cough, Right lung nodules,As noted plan follow-up MRI after ICH absorbed for further evaluation of possible metastasis 11. Diabetes mellitus with peripheral neuropathy. Hemoglobin A1c 6.8. Glucophage 1000 mg at breakfast and 500 mg supper , Generally well controlled on the current regimen Amaryl 1 mg daily. Check blood sugars before meals and at bedtime CBG (last 3) generally controlled  Recent Labs  07/06/15 0603 07/06/15 1218 07/06/15 1635  GLUCAP 157* 146* 116*    12. Hyperlipidemia. Lipitor 13. UTI enterobacter cloacae- Sensitive to ceftriaxone. We'll start 1 g every 24 hours.  Afebrile , 7 day course    We'll complete on Monday           4. Anemia stool guaiac negative, thus far looks like normocytic anemia of chronic disease exacerbated by frequent blood draws while on IV heparin.started pt on Iron supplement .  As per internal medicine now on Xarelto. This was also Recommended by neurology.Will monitor for stability repeat CBC in a.m.    LOS (Days) 15 A FACE TO FACE EVALUATION WAS PERFORMED  KIRSTEINS,ANDREW E 07/06/2015, 5:28 PM

## 2015-07-06 NOTE — Progress Notes (Signed)
Physical Therapy Session Note  Patient Details  Name: Ivan Beasley MRN: [id number] Date of Birth: June 28, 1940  Today's Date: 07/06/2015 PT Individual Time: 1500-1530 PT Individual Time Calculation (min): 30 min    Skilled Therapeutic Interventions/Progress Updates:   Patient in [place] wheelchair upon arrival with wife present, reporting fatigue. Session focused on slide board transfer wheelchair > bed with max A and max multimodal cues for sequencing and technique, static sitting balance EOB with close supervision-mod A and max verbal/tactile cues for midline orientation and upright posture, sit <> lateral lean to WB through L forearm to facilitate weight shift to L, bed mobility re-training with mod A to lift BLE on to bed, and rolling with max A to place bed pan due to bowel urgency. Patient left on bedpan with wife present and needs in reach.    Therapy Documentation Precautions:  Precautions Precautions: Fall Precaution Comments: R hemi, pt on home O2 PTA for recent pneumonia (but used it PRN, not 24/7) Restrictions Weight Bearing Restrictions: No Pain: Pain Assessment Pain Assessment: No/denies pain   See Function Navigator for Current Functional Status.   Therapy/Group: Individual Therapy  Laretta Alstrom 07/06/2015, 4:20 PM

## 2015-07-06 NOTE — Progress Notes (Signed)
Occupational Therapy Session Note  Patient Details  Name: Ivan Beasley MRN: [id number] Date of Birth: 10/05/1940  Today's Date: 07/06/2015 OT Individual Time: 1100-1200 OT Individual Time Calculation (min): 60 min    Short Term Goals: Week 1:  OT Short Term Goal 1 (Week 1): Pt will demonstrate increased awareness to RUE by positioning appropriately to decrease shoulder subluxation OT Short Term Goal 1 - Progress (Week 1): Not met OT Short Term Goal 2 (Week 1): Pt will completed squat pivot transfer to toilet with max assist of one caregiver OT Short Term Goal 2 - Progress (Week 1): Not met OT Short Term Goal 3 (Week 1): Pt will complete UB dressing with mod assist OT Short Term Goal 3 - Progress (Week 1): Not met OT Short Term Goal 4 (Week 1): Pt will complete LB dressing with max assist of one caregiver at sit > stand level OT Short Term Goal 4 - Progress (Week 1): Not met OT Short Term Goal 5 (Week 1): Pt will complete bathing with mod assist at sit > stand level OT Short Term Goal 5 - Progress (Week 1): Not met Week 2:  OT Short Term Goal 1 (Week 2): Pt will demonstrate increased awareness to RUE by positioning appropriately to decrease shoulder subluxation OT Short Term Goal 2 (Week 2): Pt will complete squat pivot transfer to toilet with max assist of one caregiver OT Short Term Goal 3 (Week 2): Pt will complete UB dressing with mod assist in unsupported sitting OT Short Term Goal 4 (Week 2): Pt will complete LB dressing with max assist of one caregiver at bed level OT Short Term Goal 5 (Week 2): Pt will complete bathing with mod assist at bed level     Skilled Therapeutic Interventions/Progress Updates:    Pt seen for skilled OT to facilitate dynamic sitting balance, trunk control, and RUE facilitation. Pt was already dressed and ready for the day. Pt taken to gym to use slide board to mat with max A (+2 for safety).  On mat worked on [place] wt bearing with R leans, LUE dynamic  reach to left to facilitate trunk activation.  Pt needed several rest breaks, but after 30 min of working he achieved full upright trunk extension with head lift maintaining static sitting with S.  After 45 min, pt very fatigued and transferred back to w/c.  RUE facilitation of holding cup. Only slight tone in fingers, no active movement observed in hand today. Pt taken back to room and pt's wife with spouse.   Therapy Documentation Precautions:  Precautions Precautions: Fall Precaution Comments: R hemi, pt on home O2 PTA for recent pneumonia (but used it PRN, not 24/7) Restrictions Weight Bearing Restrictions: No      Pain: Pain Assessment Pain Assessment: No/denies pain ADL:  See Function Navigator for Current Functional Status.   Therapy/Group: Individual Therapy  SAGUIER,JULIA 07/06/2015, 12:28 PM

## 2015-07-06 NOTE — Progress Notes (Signed)
Physical Therapy Session Note  Patient Details  Name: Ivan Beasley MRN: [id number] Date of Birth: 1940-08-23  Today's Date: 07/06/2015 PT Individual Time: 0930-1015 PT Individual Time Calculation (min): 45 min   Short Term Goals: Week 2:  PT Short Term Goal 1 (Week 2): Pt will perform supine<>sit with modA PT Short Term Goal 2 (Week 2): Pt will perform transfer w/c <>bed with maxA +1 PT Short Term Goal 3 (Week 2): Pt will perform sit <>stand maxA PT Short Term Goal 4 (Week 2): Pt will demonstrate sitting balance x3 min with minA PT Short Term Goal 5 (Week 2): Pt will initiate gait training  Skilled Therapeutic Interventions/Progress Updates:    Pt received supine in bed, denies pain and agreeable to treatment. Dons pants in supine with maxA and bridging to pull pants over hips. Supine>sit modA with bedrail. Seated EOB, minA for seated balance while pt dons shirt maxA. Lateral scoot to w/c with transfer board and maxA. Sit <>stand maxA with LUE on rail; poor standing posture and balance once standing. Sit <>stand with three muskateers maxA. Gait x25' and 15' with +2A; totalA for RLE progression, weight bearing and stance control. Returned to room in w/c totalA. Remained semi-reclined in w/c with all needs in reach and wife present.   Therapy Documentation Precautions:  Precautions Precautions: Fall Precaution Comments: R hemi, pt on home O2 PTA for recent pneumonia (but used it PRN, not 24/7) Restrictions Weight Bearing Restrictions: No Pain: Pain Assessment Pain Assessment: No/denies pain   See Function Navigator for Current Functional Status.   Therapy/Group: Individual Therapy  Luberta Mutter 07/06/2015, 1:10 PM

## 2015-07-07 ENCOUNTER — Inpatient Hospital Stay (HOSPITAL_COMMUNITY): Payer: Medicare HMO | Admitting: Physical Therapy

## 2015-07-07 ENCOUNTER — Inpatient Hospital Stay (HOSPITAL_COMMUNITY): Payer: Medicare HMO | Admitting: Occupational Therapy

## 2015-07-07 ENCOUNTER — Inpatient Hospital Stay (HOSPITAL_COMMUNITY): Payer: Medicare HMO | Admitting: Speech Pathology

## 2015-07-07 DIAGNOSIS — D538 Other specified nutritional anemias: Secondary | ICD-10-CM

## 2015-07-07 LAB — CBC
HCT: 28.5 % — ABNORMAL LOW (ref 39.0–52.0)
Hemoglobin: 8.8 g/dL — ABNORMAL LOW (ref 13.0–17.0)
MCH: 26.3 pg (ref 26.0–34.0)
MCHC: 30.9 g/dL (ref 30.0–36.0)
MCV: 85.3 fL (ref 78.0–100.0)
Platelets: 183 10*3/uL (ref 150–400)
RBC: 3.34 MIL/uL — ABNORMAL LOW (ref 4.22–5.81)
RDW: 17.4 % — ABNORMAL HIGH (ref 11.5–15.5)
WBC: 5.6 10*3/uL (ref 4.0–10.5)

## 2015-07-07 LAB — COMPREHENSIVE METABOLIC PANEL
ALT: 16 U/L — ABNORMAL LOW (ref 17–63)
AST: 20 U/L (ref 15–41)
Albumin: 3.3 g/dL — ABNORMAL LOW (ref 3.5–5.0)
Alkaline Phosphatase: 102 U/L (ref 38–126)
Anion gap: 9 (ref 5–15)
BUN: 10 mg/dL (ref 6–20)
CO2: 22 mmol/L (ref 22–32)
Calcium: 9.8 mg/dL (ref 8.9–10.3)
Chloride: 105 mmol/L (ref 101–111)
Creatinine, Ser: 1.51 mg/dL — ABNORMAL HIGH (ref 0.61–1.24)
GFR calc Af Amer: 50 mL/min — ABNORMAL LOW (ref >60)
GFR calc non Af Amer: 43 mL/min — ABNORMAL LOW (ref >60)
Glucose, Bld: 92 mg/dL (ref 65–99)
Potassium: 5.1 mmol/L (ref 3.5–5.1)
Sodium: 136 mmol/L (ref 135–145)
Total Bilirubin: 0.5 mg/dL (ref 0.3–1.2)
Total Protein: 7 g/dL (ref 6.5–8.1)

## 2015-07-07 LAB — GLUCOSE, CAPILLARY
Glucose-Capillary: 136 mg/dL — ABNORMAL HIGH (ref 65–99)
Glucose-Capillary: 143 mg/dL — ABNORMAL HIGH (ref 65–99)
Glucose-Capillary: 106 mg/dL — ABNORMAL HIGH (ref 65–99)
Glucose-Capillary: 96 mg/dL (ref 65–99)

## 2015-07-07 LAB — OCCULT BLOOD X 1 CARD TO LAB, STOOL: Fecal Occult Bld: NEGATIVE

## 2015-07-07 NOTE — Progress Notes (Signed)
Social Work Patient ID: Ivan Beasley, male   DOB: [birth year], 75 y.o.   MRN: [id number]   CSW met with pt and pt's wife to update them on the team conference discussion, including the team's concern that pt may require more care than pt's wife can provide at home. CSW presented the option of pt going to a SNF prior to returning home.  [name] was clearly disappointed with the idea that pt may need SNF instead of just going straight home from rehab.it   CSW encouraged them to think about it and that we would discuss it again tomorrow.  Support given to pt/wife and CSW will continue to follow.

## 2015-07-07 NOTE — Progress Notes (Signed)
Physical Therapy Weekly Progress Note  Patient Details  Name: DAMARCO KEYSOR MRN: [id number] Date of Birth: [birth year]  Beginning of progress report period: June 30, 2015 End of progress report period: July 07, 2015  Today's Date: 07/07/2015 PT Individual Time: 1000-1100 PT Individual Time Calculation (min): 60 min   Patient has met 3 of 5 short term goals.  Pt has made minimal progress towards long term goals. 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 Requires maxA to maxA +2 for transfers w/c <>bed, and modA for bed mobility fluctuating to maxA depending on fatigue level. Wife is present for nearly every session, highly encouraging and supportive, and pt is motivated and hard working during sessions however limited by excessive fatigue, poor sustained attention, limited carryover between sessions.    Patient continues to demonstrate the following deficits:  activity tolerance, balance, postural control, ability to compensate for deficits, functional use of  right upper extremity and right lower extremity, attention, awareness and coordination and therefore will continue to benefit from skilled PT intervention to enhance overall performance with bed mobility, transfers, gait, home access.   Patient not progressing toward long term goals.  See goal revision..  Plan of care revisions: D/c car transfer, w/c propulsion, furniture transfer goals. Downgraded transfer goal to maxA +1, sitting balance and bed mobility minA. Marland Kitchen  PT Short Term Goals Week 2:  PT Short Term Goal 1 (Week 2): Pt will perform supine<>sit with modA PT Short Term Goal 1 - Progress (Week 2): Met PT Short Term Goal 2 (Week 2): Pt will perform transfer w/c <>bed with maxA +1 PT Short Term Goal 2 - Progress (Week 2): Met PT Short Term Goal 3 (Week 2): Pt will perform sit <>stand  maxA PT Short Term Goal 3 - Progress (Week 2): Not met PT Short Term Goal 4 (Week 2): Pt will demonstrate sitting balance x3 min with minA PT Short Term Goal 4 - Progress (Week 2): Not met PT Short Term Goal 5 (Week 2): Pt will initiate gait training PT Short Term Goal 5 - Progress (Week 2): Met Week 3:  PT Short Term Goal 1 (Week 3): =LTG due to estimated length of stay  Skilled Therapeutic Interventions/Progress Updates:    Pt received supine in bed, denies pain and agreeable to treatment. Supine >sit modA with max cues for sequencing and hand placement. Sitting balance with mod multimodal cues for weight shifting R/L to achieve/maintain midline, reduce pushing with LUE, and demonstrate trunk/cervical extension and upright posture. Lateral scoot bed>w/c maxA with transfer board, cues for anterior weight shift and assist with L extremities. Dynamic sitting reaching activities with LUE in L anterolateral direction. Transfer w/c >mat table maxA squat pivot, transfer board maxA to return to w/c at end of session due to fatigue. Scooting R with +2A for anterior weight shift and elevated mat table to improve pushing with BLEs to elevate hips off table. Repetitive L sidesitting on L elbow transitioned to midline sitting with RUE on bench.   Therapy Documentation Precautions:  Precautions Precautions: Fall Precaution Comments: R hemi, pt on home O2 PTA for recent pneumonia (but used it PRN, not 24/7) Restrictions Weight Bearing Restrictions: No   See Function Navigator for Current Functional Status.  Therapy/Group: Individual Therapy  Luberta Mutter 07/07/2015, 11:57 AM

## 2015-07-07 NOTE — Progress Notes (Signed)
[facility] PROGRESS NOTE  Ivan Beasley MGN:[id number] DOB: 1940-03-07 DOA: 06/13/2015   PCP: Albina Billet, MD     Assessment/Plan: Principal Problem:   ICH (intracerebral hemorrhage) ([place])- L frontal ICH Active Problems:   Mild chronic obstructive pulmonary disease (HCC)   Seizures ([place]), possible   CKD (chronic kidney disease), stage III   Anemia   Right hemiparesis ([place])   Cerebrovascular accident (CVA) due to embolism of left middle cerebral artery (HCC)   Chronic anticoagulation   Malignant neoplasm of left lung (HCC)   Adjustment disorder with mixed anxiety and depressed mood   Ivan Beasley is a 75 y.o. 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 Consultation requested for anemia   Assessment and plan  1. Anemia - H/H is stable. Patient is back to Xarelto. CT abdomen is negative for retroperitoneal bleed.   2. Recent Right hemiplegia secondary to left MCA infarct adjacent to left frontal ICH with adjacent subarachnoid hemorrhage while on Eliquis for Atrial fib.  - Due to near resolution of ICH as well as has been >2 weeks, and high risk for recurrent embolic stroke off anticoagulation and relative small size of infarct, neurology Dr. Erlinda Hong  recommended on 6/20, to consider transition back to [place], wife okay with Xarelto, this can be started , continue to monitor hemoglobin  MRI with and without contrast 06/26/15 can not confirm or rule out metastasis at this time. As per neurology Still need to  repeat MRI with and without contrast after blood all absorbed.  3. Atrial fib. - sounds PAF., Currently normal sinus rhythm EKG  shows normal sinus rhythm  CHADsVASc score at least 6, very high risk for recurrent strokes, will need to weigh risk/benefits of resuming anticoagulation after r/o bleeding risk. Patient is back to Xarelto. 4. htn - stable. Optimize.  5. Dm 2 w/ neuropathy - Optimize. 6. Cad, sp cabg - in light of cardiac hx, will trx for Hb >8 at this time 7. 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 UTI enterobacter cloacae- Sensitive to ceftriaxone. Completed 7 day course of antibiotics. 9.  Chronic nonproductive cough-last chest x-ray was on 6/23 with focal increased density in the right lung base, will repeat chest x-ray, doubt pneumonia, continue incentive spirometry, aspiration precautions    DVT prophylaxsis : On Xarelto  Code Status:  Full code       Family Communication: Discussed in detail with the [name], all imaging results, lab results explained to the patient   Disposition Plan: As per Charlett Blake, MD     Consultants:  [facility]  Procedures:   None  Antibiotics: Anti-infectives    Start     Dose/Rate Route Frequency Ordered Stop   06/28/15 1000  cefTRIAXone (ROCEPHIN) 1 g in dextrose 5 % 50 mL IVPB  Status:  Discontinued     1 g 100 mL/hr over 30 Minutes Intravenous Every 24 hours 06/28/15 0847 07/05/15 0834   06/27/15 2100  vancomycin (VANCOCIN) 1,500 mg in sodium chloride 0.9 % 500 mL IVPB  Status:  Discontinued     1,500  mg 250 mL/hr over 120 Minutes Intravenous Every 24 hours 06/26/15 2018 06/28/15 0843      HPI/Subjective: Continues to have a nonproductive cough, otherwise stable, afebrile  Objective: Filed Vitals:   07/06/15 0624 07/06/15 1454 07/07/15 0500 07/07/15 0716  BP: 142/62 117/64 135/58   Pulse: 94 99 87   Temp: 97.9 F (36.6 C) 97.7 F  (36.5 C) 97.8 F (36.6 C)   TempSrc: Oral Oral Oral   Resp: '18 18 19   '$ Weight:    92.126 kg (203 lb 1.6 oz)  SpO2: 97% 99% 99%     Intake/Output Summary (Last 24 hours) at 07/07/15 1004 Last data filed at 07/07/15 0819  Gross per 24 hour  Intake    240 ml  Output      0 ml  Net    240 ml    Exam:  Examination:  General exam: Appears calm and comfortable  Respiratory system: Clear to auscultation. Respiratory effort normal. Cardiovascular system: S1 & S2 heard, RRR. No JVD, murmurs, rubs, gallops or clicks. No pedal edema. Gastrointestinal system: Abdomen is nondistended, soft and nontender. No organomegaly or masses felt. Normal bowel sounds heard. Central nervous system: Alert and oriented. Right upper extremity weakness Extremities: Symmetric 5 x 5 power. Skin: No rashes, lesions or ulcers Psychiatry: Judgement and insight appear normal. Mood & affect appropriate.     Data Reviewed: I have personally reviewed following labs and imaging studies  Micro Results No results found for this or any previous visit (from the past 240 hour(s)).  Radiology Reports Ct Abdomen Pelvis Wo Contrast  07/04/2015  CLINICAL DATA:  Anemia, history coronary disease post MI, diabetes mellitus, former smoker, stage III chronic kidney disease, LEFT lung cancer, atrial fibrillation EXAM: CT ABDOMEN AND PELVIS WITHOUT CONTRAST TECHNIQUE: Multidetector CT imaging of the abdomen and pelvis was performed following the standard protocol without IV contrast. Sagittal and coronal MPR images reconstructed from axial data set. No oral contrast was administered. COMPARISON:  None. CT from PET-CT exam 05/03/2015 FINDINGS: Lower chest:  Bibasilar atelectasis. Hepatobiliary: Cholelithiasis. Beam hardening artifacts from patient's arms traverse liver, no gross hepatic abnormality seen. Pancreas: Atrophic, otherwise unremarkable Spleen: Traversing artifacts, grossly normal appearance Adrenals/Urinary Tract: Normal  appearing adrenal glands. Cyst at inferior pole LEFT kidney again identified 3.4 x 3.6 cm image 49. Kidneys otherwise unremarkable without additional mass or hydronephrosis. Single nondilated ureters bilaterally. Normal appearing well distended urinary bladder. Stomach/Bowel: Normal appendix. Retained dense contrast within colon from recent swallowing study. Stomach and bowel loops otherwise normal appearance for technique. Vascular/Lymphatic: Atherosclerotic calcifications aorta, iliac, and femoral arteries. Few coronary arterial calcifications. Low-attenuation of circulating blood consistent with history of anemia. No adenopathy. Reproductive: N/A Other: Small BILATERAL inguinal hernias containing fat. No free air or free fluid. Musculoskeletal: Degenerative disc disease changes L4-L5. No acute osseous findings. IMPRESSION: LEFT renal cyst. Cholelithiasis. Diffuse colonic diverticulosis without gross evidence acute diverticulitis. Small BILATERAL inguinal hernias containing fat. Aortic atherosclerosis. Electronically Signed   By: Lavonia Dana M.D.   On: 07/04/2015 12:55   Dg Chest 2 View  07/02/2015  CLINICAL DATA:  Persistent cough, COPD, lung malignancy. Patient cyst has sustained an intracranial hemorrhage with right hemi [place] is EXAM: CHEST  2 VIEW COMPARISON:  Chest x-ray of June 26, 2015 FINDINGS: The right lung is adequately inflated and exhibits increased density in the infrahilar region. There is some overlap here of the Port-A-Cath reservoir. On the left there is persistent increased density in the perihilar region extending  toward the lateral pleural surface. The heart is top-normal in size. There are post CABG changes. Three upper sternal wires are chronically broken. There is calcification in the wall of the aortic arch. There is no pleural effusion or pneumothorax. The Port-A-Cath appliance tip projects over the midportion of the SVC. IMPRESSION: 1. Subtle increased density at the right lung base  suggests subsegmental atelectasis. Stable parenchymal density in the left mid lung and left perihilar region. 2. Stable post CABG changes.  No evidence of CHF. Electronically Signed   By: David  Martinique M.D.   On: 07/02/2015 09:40   Dg Chest 2 View  06/26/2015  CLINICAL DATA:  C/o chronic cough. 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 Rales heard in right lower lung. 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 EXAM: CHEST  2 VIEW COMPARISON:  06/25/2015 FINDINGS: There stable changes from prior CABG surgery. Cardiac silhouette is mildly enlarged. Focal irregular opacity in the posterior perihilar region of the left lung is stable. There are no new areas of lung opacification. No pleural effusion or pneumothorax. Right anterior chest wall Port-A-Cath is stable. Bony thorax is intact. IMPRESSION: 1. No acute cardiopulmonary disease. 2. No change from the prior study. Persistent focal opacity in the perihilar region of the left lung consistent with the reported squamous cell lung carcinoma. Electronically Signed   By: Lajean Manes M.D.   On: 06/26/2015 16:36   Dg Chest 2 View  06/25/2015  CLINICAL DATA:  Cough, left-sided weakness EXAM: CHEST  2 VIEW COMPARISON:  06/13/2015 FINDINGS: Cardiomediastinal silhouette is stable. Right IJ Port-A-Cath is unchanged in position. Persistent left upper lobe consolidation. Bony thorax is unremarkable. IMPRESSION: No significant change. Persistent left upper lobe consolidation. Stable right IJ Port-A-Cath position. Electronically Signed   By: Lahoma Crocker M.D.   On: 06/25/2015 16:54   Ct Head Wo Contrast  06/26/2015  CLINICAL DATA:  Slurred speech. EXAM: CT HEAD WITHOUT CONTRAST TECHNIQUE: Contiguous axial images were obtained from the base of the skull through the vertex without intravenous contrast. COMPARISON:  MRI of June 14, 2015.  CT scan of June 13, 2015. FINDINGS: Bony calvarium appears intact.  Sphenoid sinusitis is noted.  Mild diffuse cortical atrophy is noted. There is continued presence of left superior parietal intraparenchymal hemorrhage which now measures 21 x 11 mm and is slightly decreased in size compared to prior exam. Subarachnoid hemorrhage has resolved. There is continued surrounding vasogenic edema. No significant midline shift is noted. Ventricular size is unremarkable. IMPRESSION: Mild diffuse cortical atrophy. Continued presence of left parietal intraparenchymal hemorrhage with surrounding vasogenic edema. This is improved compared to prior exam. Subarachnoid hemorrhage is no longer present. Electronically Signed   By: Marijo Conception, M.D.   On: 06/26/2015 14:29   Ct Head Wo Contrast  06/13/2015  CLINICAL DATA:  The woke at 5 a.m. with right-sided weakness. EXAM: CT HEAD WITHOUT CONTRAST TECHNIQUE: Contiguous axial images were obtained from the base of the skull through the vertex without intravenous contrast. COMPARISON:  None. FINDINGS: There is intra cerebral hemorrhage noted in the posterior left frontal lobe. Subarachnoid extension. Hemorrhage measures 2.4 x 2.2 x 1.6 cm for a volume of 4.2 mL. Mild surrounding vasogenic edema. No significant mass effect. No midline shift. No hydrocephalus. No acute calvarial abnormality. Air-fluid level in the left sphenoid sinus. Mastoid air cells are clear. IMPRESSION: Left posterior frontal intra cerebral hemorrhage with subarachnoid extension as described above. Critical Value/emergent results were called  by telephone at the time of interpretation on 06/13/2015 at 10:02 am to Dr. Loura Pardon , who verbally acknowledged these results. Electronically Signed   By: Rolm Baptise M.D.   On: 06/13/2015 10:04   Mr Jodene Nam Head Wo Contrast  06/15/2015  ADDENDUM REPORT: 06/15/2015 08:11 ADDENDUM: With the rounded contour of the enhancing component of the left frontal lobe hematoma, follow-up until clearance recommended to exclude underlying vascular abnormality. Electronically Signed    By: Genia Del M.D.   On: 06/15/2015 08:11  06/15/2015  CLINICAL DATA:  75 year old diabetic male with history of lung cancer. Right-sided weakness. Subsequent encounter. EXAM: MRI HEAD WITHOUT CONTRAST MRA HEAD WITHOUT CONTRAST TECHNIQUE: Multiplanar, multiecho pulse sequences of the brain and surrounding structures were obtained without intravenous contrast. Angiographic images of the head were obtained using MRA technique without contrast. COMPARISON:  06/13/2015 head CT.  07/28/2014 brain MR. FINDINGS: MRI HEAD FINDINGS Posterior left frontal lobe complex 2.5 x 2.2 x 2 cm hemorrhagic lesion with marked surrounding vasogenic edema with breakthrough into sulci with subarachnoid hemorrhage noted. Patient's history of lung cancer in addition to 6 mm rounded area of enhanced along the posterior margin of this hemorrhage suggests that this is most likely is related to a hemorrhagic metastatic lesion which has bled. Continued MR surveillance as hemorrhage clears is recommended. There may be a a second enhancing lesion within the anterior left parietal lobe with surrounding vasogenic edema. Venous infarct can have a similar appearance. The major dural sinuses appear patent. No acute thrombotic infarct separate from the above described findings. Remote small left parietal lobe, left frontal lobe, posterior left opercular and tiny right thalamic infarct. Mild small vessel disease changes. Moderate global atrophy without hydrocephalus. Opacification left sphenoid sinus with air-fluid level suggesting acute sinusitis. Mild mucosal thickening ethmoid sinus air cells and minimal mucosal thickening frontal sinuses. Bilateral mastoid air cell and middle ear opacification greater on the left without obstructing lesion of the eustachian tube noted. Post lens replacement without acute orbital abnormality noted. MRA HEAD FINDINGS MR angiogram does not incorporate the left frontal lobe hemorrhage. Mild narrowing supraclinoid  aspect internal carotid artery bilaterally with irregularity more notable on the left. Small infundibulum on the left posterior communicating artery level. Anterior circulation without medium or large size vessel significant stenosis or occlusion. Middle cerebral artery branch vessel irregularity bilaterally. Right vertebral artery ends in a posterior inferior cerebellar artery distribution. No significant narrowing left vertebral artery. Moderate narrowing portions of the left posterior inferior cerebellar artery. Ectatic basilar artery without high-grade stenosis. Nonvisualized anterior inferior cerebellar arteries. Small left superior cerebellar artery. Posterior cerebral artery distal branch vessel narrowing bilaterally. IMPRESSION: MRI HEAD Posterior left frontal lobe complex 2.5 x 2.2 x 2 cm hemorrhagic lesion with marked surrounding vasogenic edema with breakthrough into sulci with subarachnoid hemorrhage noted. Patient's history of lung cancer in addition to 6 mm rounded area of enhanced along the posterior margin of this hemorrhage suggests that this is most likely is related to a hemorrhagic metastatic lesion which has bled. Continued MR surveillance as hemorrhage clears is recommended. There may be a a second enhancing lesion within the anterior left parietal lobe with surrounding vasogenic edema. Venous infarct can have a similar appearance. The major dural sinuses appear patent. Opacification left sphenoid sinus with air-fluid level suggesting acute sinusitis. Bilateral mastoid air cell and middle ear opacification greater on the left without obstructing lesion of the eustachian tube noted. MRA HEAD MR angiogram does not incorporate the left frontal lobe  hemorrhage. Mild narrowing supraclinoid aspect internal carotid artery bilaterally with irregularity more notable on the left. Small infundibulum on the left posterior communicating artery level. Anterior circulation without medium or large size vessel  significant stenosis or occlusion. Middle cerebral artery branch vessel irregularity bilaterally. Right vertebral artery ends in a posterior inferior cerebellar artery distribution. No significant narrowing left vertebral artery. Moderate narrowing portions of the left posterior inferior cerebellar artery. Ectatic basilar artery without high-grade stenosis. Nonvisualized anterior inferior cerebellar arteries. Small left superior cerebellar artery. Posterior cerebral artery distal branch vessel narrowing bilaterally. Electronically Signed: By: Genia Del M.D. On: 06/14/2015 19:34   Mr Ivan Beasley BJ Contrast  06/26/2015  CLINICAL DATA:  Initial evaluation for progressively garbled and slurred speech, with increased right-sided facial droop. EXAM: MRI HEAD WITHOUT AND WITH CONTRAST TECHNIQUE: Multiplanar, multiecho pulse sequences of the brain and surrounding structures were obtained without and with intravenous contrast. CONTRAST:  10m MULTIHANCE GADOBENATE DIMEGLUMINE 529 MG/ML IV SOLN COMPARISON:  Prior CT from earlier the same day as well as previous MRI from 06/14/2015. FINDINGS: Generalized cerebral atrophy with minimal chronic small vessel ischemic changes again noted, stable. Small remote lacunar infarct within the right thalamus. Previously identified posterior left frontal lobe complex hemorrhagic lesion again seen, overall likely decreased in size from previous. Lesion measures 13 x 21 x 16 mm on T2 weighted sequences. Surrounding vasogenic edema is similar. Breakthrough subarachnoid hemorrhage within the adjacent cortical sulci again seen, improved. There is increased peripheral enhancement about the margin of the hemorrhagic lesion, likely related to the evolving hematoma. Previously noted small hemorrhagic nodule is not as clearly delineated on this exam. Note again made of a second possible enhancing lesion within the anterior left parietal lobe along the dura, measuring approximately 9 x 11 x 11 mm.  Surrounding vasogenic edema. No other new enhancing lesions. Immediately lateral and inferior to the dominant hemorrhagic lesion. There is a focus of abnormal restricted diffusion involving the cortical gray matter and underlying white matter of the posterior left frontal region, compatible with acute ischemic infarct (series 4, image 35). Associated signal loss on ADC map. This appears to be more vascular distribution rather than seizure related. Additional small sub cm focus of restricted diffusion more posteriorly within the left centrum semi ovale. No significant mass effect. Area of infarction closely approximates the hemorrhagic lesion can subarachnoid hemorrhage, but does not appear to be hemorrhagic in of itself. No other acute infarct. Gray-white matter differentiation otherwise maintained. Major intracranial vascular flow voids are preserved. No midline shift. No hydrocephalus. No extra-axial fluid collection. Again, major dural sinuses appear to be grossly patent. Craniocervical junction within normal limits. Visualized upper cervical spine unremarkable. Pituitary gland within normal limits. No acute abnormality about the globes and orbits. Patient is status post lens extraction bilaterally. Mucosal thickening with fluid level within the left sphenoid sinus, similar to prior. Mild scattered opacity within the anterior ethmoidal air cells. Bilateral mastoid effusions present. Visualize bone marrow within normal limits. Scalp soft tissues demonstrate no acute abnormality. IMPRESSION: 1. Acute ischemic left MCA territory infarct adjacent to the posterior left frontal hemorrhagic lesion. Vasospasm as an underlying etiology is suspected given the adjacent complex hemorrhagic lesion and subarachnoid hemorrhage. 2. Continued interval evolution of complex left frontal lobe hemorrhagic lesion with similar vasogenic edema. Associated breakthrough subarachnoid hemorrhage is improved. This lesion now demonstrates  peripheral enhancement, which would be expected with an evolving hematoma. Previously noted small nodular focus of enhancement not well delineated on this exam.  Again, continued MR surveillance at the hemorrhage clears is recommended to ensure no underlying lesion is present. 3. Additional 9 x 11 x 11 mm enhancing lesion within the anterior left parietal lobe, similar to prior. Attention at follow-up recommended. 4. Acute left sphenoid sinus disease with bilateral mastoid effusions, similar to prior. Electronically Signed   By: Jeannine Boga M.D.   On: 06/26/2015 23:30   Mr Ivan Beasley BD Contrast  06/15/2015  ADDENDUM REPORT: 06/15/2015 08:11 ADDENDUM: With the rounded contour of the enhancing component of the left frontal lobe hematoma, follow-up until clearance recommended to exclude underlying vascular abnormality. Electronically Signed   By: Genia Del M.D.   On: 06/15/2015 08:11  06/15/2015  CLINICAL DATA:  75 year old diabetic male with history of lung cancer. Right-sided weakness. Subsequent encounter. EXAM: MRI HEAD WITHOUT CONTRAST MRA HEAD WITHOUT CONTRAST TECHNIQUE: Multiplanar, multiecho pulse sequences of the brain and surrounding structures were obtained without intravenous contrast. Angiographic images of the head were obtained using MRA technique without contrast. COMPARISON:  06/13/2015 head CT.  07/28/2014 brain MR. FINDINGS: MRI HEAD FINDINGS Posterior left frontal lobe complex 2.5 x 2.2 x 2 cm hemorrhagic lesion with marked surrounding vasogenic edema with breakthrough into sulci with subarachnoid hemorrhage noted. Patient's history of lung cancer in addition to 6 mm rounded area of enhanced along the posterior margin of this hemorrhage suggests that this is most likely is related to a hemorrhagic metastatic lesion which has bled. Continued MR surveillance as hemorrhage clears is recommended. There may be a a second enhancing lesion within the anterior left parietal lobe with surrounding  vasogenic edema. Venous infarct can have a similar appearance. The major dural sinuses appear patent. No acute thrombotic infarct separate from the above described findings. Remote small left parietal lobe, left frontal lobe, posterior left opercular and tiny right thalamic infarct. Mild small vessel disease changes. Moderate global atrophy without hydrocephalus. Opacification left sphenoid sinus with air-fluid level suggesting acute sinusitis. Mild mucosal thickening ethmoid sinus air cells and minimal mucosal thickening frontal sinuses. Bilateral mastoid air cell and middle ear opacification greater on the left without obstructing lesion of the eustachian tube noted. Post lens replacement without acute orbital abnormality noted. MRA HEAD FINDINGS MR angiogram does not incorporate the left frontal lobe hemorrhage. Mild narrowing supraclinoid aspect internal carotid artery bilaterally with irregularity more notable on the left. Small infundibulum on the left posterior communicating artery level. Anterior circulation without medium or large size vessel significant stenosis or occlusion. Middle cerebral artery branch vessel irregularity bilaterally. Right vertebral artery ends in a posterior inferior cerebellar artery distribution. No significant narrowing left vertebral artery. Moderate narrowing portions of the left posterior inferior cerebellar artery. Ectatic basilar artery without high-grade stenosis. Nonvisualized anterior inferior cerebellar arteries. Small left superior cerebellar artery. Posterior cerebral artery distal branch vessel narrowing bilaterally. IMPRESSION: MRI HEAD Posterior left frontal lobe complex 2.5 x 2.2 x 2 cm hemorrhagic lesion with marked surrounding vasogenic edema with breakthrough into sulci with subarachnoid hemorrhage noted. Patient's history of lung cancer in addition to 6 mm rounded area of enhanced along the posterior margin of this hemorrhage suggests that this is most likely is  related to a hemorrhagic metastatic lesion which has bled. Continued MR surveillance as hemorrhage clears is recommended. There may be a a second enhancing lesion within the anterior left parietal lobe with surrounding vasogenic edema. Venous infarct can have a similar appearance. The major dural sinuses appear patent. Opacification left sphenoid sinus with air-fluid level suggesting acute sinusitis.  Bilateral mastoid air cell and middle ear opacification greater on the left without obstructing lesion of the eustachian tube noted. MRA HEAD MR angiogram does not incorporate the left frontal lobe hemorrhage. Mild narrowing supraclinoid aspect internal carotid artery bilaterally with irregularity more notable on the left. Small infundibulum on the left posterior communicating artery level. Anterior circulation without medium or large size vessel significant stenosis or occlusion. Middle cerebral artery branch vessel irregularity bilaterally. Right vertebral artery ends in a posterior inferior cerebellar artery distribution. No significant narrowing left vertebral artery. Moderate narrowing portions of the left posterior inferior cerebellar artery. Ectatic basilar artery without high-grade stenosis. Nonvisualized anterior inferior cerebellar arteries. Small left superior cerebellar artery. Posterior cerebral artery distal branch vessel narrowing bilaterally. Electronically Signed: By: Genia Del M.D. On: 06/14/2015 19:34   Dg Chest Port 1 View  07/06/2015  CLINICAL DATA:  COUGH.HX MI,CA EXAM: PORTABLE CHEST 1 VIEW COMPARISON:  07/02/2015 FINDINGS: Heart size is enlarged. Right-sided Port-A-Cath tip to the level of the lower superior vena cava. There is patchy density in the left upper lobe, stable compared with prior study. Mild interstitial edema is noted. IMPRESSION: 1. Persistent left upper lobe density. 2. Mild interstitial edema. Electronically Signed   By: Nolon Nations M.D.   On: 07/06/2015 14:09   Dg  Chest Port 1 View  07/08/2015  CLINICAL DATA:  Cough and congestion. EXAM: PORTABLE CHEST 1 VIEW COMPARISON:  June 18, 2015 FINDINGS: There is persistent airspace consolidation in the left upper lobe with volume loss. Lungs elsewhere clear. Heart is upper normal in size with pulmonary vascularity within normal limits. Port-A-Cath tip is in the superior vena cava near the cavoatrial junction, stable. No pneumothorax. No adenopathy evident. Patient is status post coronary artery bypass grafting. IMPRESSION: Persistent left upper lobe airspace consolidation with volume loss. No change from 3 days prior. Stable cardiac silhouette. Electronically Signed   By: Lowella Grip III M.D.   On: 06/15/2015 07:25   Dg Chest Port 1 View  06/18/2015  CLINICAL DATA:  Cough EXAM: PORTABLE CHEST 1 VIEW COMPARISON:  06/16/2015 FINDINGS: Stable right jugular Port-A-Cath. Low volumes with bibasilar atelectasis. Left mid lung dense pulmonary opacity is stable. No pneumothorax. IMPRESSION: Stable left mid lung dense pulmonary opacity. Bibasilar atelectasis. Electronically Signed   By: Marybelle Killings M.D.   On: 06/18/2015 15:16   Dg Chest Port 1 View  06/16/2015  CLINICAL DATA:  75 year old male with lung cancer. No chest complaint at this time. EXAM: PORTABLE CHEST 1 VIEW COMPARISON:  Chest radiograph dated 06/15/2015 FINDINGS: There has been interval removal of the endotracheal and enteric tube. The stable area of density is again noted in the left mid lung field. Multiple surgical clips arms in this area. No new consolidation identified. Overall there is improved aeration of the left lower lung field since the prior study. The right lung is clear. There is no pleural effusion or pneumothorax. Stable cardiac silhouette. Median sternotomy wires and CABG vascular clips noted. Right pectoral Port-A-Cath with tip in stable positioning. No acute osseous pathology. IMPRESSION: Interval removal of the endotracheal and enteric tube. Stable  appearing density in the left mid lung field with overall improvement of the aeration of the left lung. Electronically Signed   By: Anner Crete M.D.   On: 06/16/2015 19:54   Dg Chest Port 1 View  06/15/2015  CLINICAL DATA:  Acute respiratory failure EXAM: PORTABLE CHEST 1 VIEW COMPARISON:  Portable chest x-ray of 06/14/2015 FINDINGS: Aeration of the lungs  has improved somewhat. The tip of the endotracheal tube appears to be approximately 3.8 cm above the carina. Opacity in the left perihilar and left mid lung is unchanged. Cardiomegaly is stable. Right sided Port-A-Cath is unchanged in position, and right IJ central venous line is unchanged. IMPRESSION: Slightly better aeration. Endotracheal tube tip 3.8 cm above the carina. Electronically Signed   By: Ivar Drape M.D.   On: 06/15/2015 08:05   Dg Chest Port 1 View  06/14/2015  CLINICAL DATA:  Respiratory failure, history of coronary artery disease, GERD, diabetes, lung cancer EXAM: PORTABLE CHEST 1 VIEW COMPARISON:  06/13/2015 FINDINGS: Cardiomediastinal silhouette is stable. Status post median sternotomy. Persistent opacity left hilum and left perihilar region. Post radiation fibrosis left upper lobe again noted. Left basilar atelectasis. Stable endotracheal and NG tube position. Right IJ Port-A-Cath is unchanged in position. Stable right IJ central line. No pneumothorax. No pulmonary edema. IMPRESSION: Stable support apparatus. Persistent opacity left hilum and left perihilar region. Postradiation fibrosis in left upper lobe again noted. Left basilar atelectasis. No pulmonary edema. No pneumothorax. Status post median sternotomy. Electronically Signed   By: Lahoma Crocker M.D.   On: 06/14/2015 08:15   Dg Chest Port 1 View  06/13/2015  CLINICAL DATA:  75 year old male with endotracheal tube placement. EXAM: PORTABLE CHEST 1 VIEW COMPARISON:  Radiograph dated 06/13/2015 FINDINGS: Endotracheal tube with tip approximately 4 cm above the carina. An enteric tube  is partially visualized coursing the left hemi abdomen with tip beyond the inferior margin of the image. There is a right IJ central line with tip over the mediastinum. Right pectoral infusion catheter is also noted. There is persistent opacity in the left mid lung field. Multiple surgical clips in the left lower lung field similar to prior study. There is no pneumothorax. The right costophrenic angle has been excluded from the image. The cardiac border is silhouetted. No acute osseous pathology identified. IMPRESSION: Endotracheal tube above the carina. Stable appearing postsurgical changes and persistent opacity in the left mid lung field. Electronically Signed   By: Anner Crete M.D.   On: 06/13/2015 21:05   Dg Chest Port 1 View  06/13/2015  CLINICAL DATA:  Respiratory failure and cough. History of left lung squamous carcinoma. EXAM: PORTABLE CHEST 1 VIEW COMPARISON:  05/27/2015 and prior radiographs.  05/03/2015 PET CT FINDINGS: Cardiomegaly, CABG changes and right Port-A-Cath with tip overlying the lower SVC again noted. Left mid lung opacity is stable to slightly increased in size There is no evidence of pneumothorax or pleural effusion. No other changes identified. IMPRESSION: Stable to slightly increased left lung opacity which may represent posttreatment changes. No other significant change. Electronically Signed   By: Margarette Canada M.D.   On: 06/13/2015 18:10   Dg Abd Portable 1v  06/13/2015  CLINICAL DATA:  Orogastric tube placement EXAM: PORTABLE ABDOMEN - 1 VIEW COMPARISON:  PET-CT May 03, 2015 FINDINGS: Orogastric tube tip and side port are in the stomach. Bowel gas pattern is unremarkable. No bowel dilatation or air-fluid level suggesting obstruction. No free air. Moderate stool is noted in the colon. IMPRESSION: Orogastric tube tip and side port in stomach. Bowel gas pattern unremarkable. No free air evident. Electronically Signed   By: Lowella Grip III M.D.   On: 06/13/2015 20:05   Dg  Swallowing Func-speech Pathology  06/29/2015  Objective Swallowing Evaluation: Type of Study: MBS-Modified Barium Swallow Study Patient Details Name: Ivan Beasley MRN: [id number] Date of Birth: 16-Aug-1940 Today's Date: 06/29/2015 Time: SLP  Start Time (ACUTE ONLY): 0910-SLP Stop Time (ACUTE ONLY): 0930 SLP Time Calculation (min) (ACUTE ONLY): 20 min Past Medical History: Past Medical History Diagnosis Date . Coronary artery disease  . Pneumonia 2000 . Arthritis  . 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 Bruises easily  . GERD (gastroesophageal reflux disease)    takes Omeprazole daily as needed for stomach pain . Blood transfusion 2001 . Diabetes mellitus    takes Metformin daily; . Impaired hearing    bil hearing aide . Macular degeneration    being watched for this but hasn't been "completely" diagnosed . Myocardial infarction ([place]) 2001 . Cancer [facility])  Past Surgical History: Past Surgical History Procedure Laterality Date . Coronary artery bypass graft  2001   4 vessels . Cardiac catheterization  2001 . Tonsillectomy     as a child . Colonoscopy   . Cataract extraction     bilateral . Lumbar laminectomy/decompression microdiscectomy  12/19/2010   Procedure: LUMBAR LAMINECTOMY/DECOMPRESSION MICRODISCECTOMY;  Surgeon: Elaina Hoops;  Location: [facility];  Service: Neurosurgery;  Laterality: N/A;  Lumbar three-four, four-five decompressive lumbar laminectomy . Joint replacement  right tkr . Eye surgery     cataracts . Back surgery     l4 5 . Portacath placement N/A 05/20/2014   Procedure: INSERTION PORT-A-CATH;  Surgeon: Nestor Lewandowsky, MD;  Location: [facility] ORS;  Service: General;  Laterality: N/A; HPI: 75 y.o. male patient with PMH: GERD, pna, CAD, DM, MI, cancer and chronic admitted with right-sided hemi-plegia. CT of the head revealed left frontal intracerebral hemorrhage, with subarachnoid hemorrhage as well adjacent to it. Intubated approximately 24-30 hours (extubated 6/6). 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  Objective swallow study ordered to determine safest diet consistencies due to change in function.  No Data Recorded Assessment / Plan / Recommendation CHL IP CLINICAL IMPRESSIONS 06/29/2015 Therapy Diagnosis Moderate oral dysphagia, Moderate pharyngeal dysphagia  Clinical Impression Pt presents with a moderate oropharyngeal dysphagia with both sensory and motor components.  As mentioned on bedside swallow evaluation, pt presents with right sided labial, lingual, and buccal weakness which impacts his ability to contain and transit boluses effectively.  This in combination with decreased pharyngeal sensation result in premature spillage of materials into the pharynx with swallow response most consistently triggered at the pyriforms.  Delay in swallow response resulted in aspiration of thin liquids with delayed sensation.  Congested cough which occurred several seconds after aspiration event was ineffective for clearing aspirates from the airway.  Deep penetration was noted x1 over multiple boluses of nectar thick liquids which pt was able to sense and clear from the airway before materials passed below the vocal cords.  Poor base of tongue strength and decreased hyolaryngeal excursion also contributed to moderate pharyngeal residuals post swallow (vallecular residue > pyriforms).  Residue cleared to minimal-mild with cues for extra swallows. Recommend that pt initiate a Dys 1 diet with nectar thick liquids and full supervision for use of swallowing precautions.   Prognosis for advancement good with SLP intervention for pharyngeal strengthening exercises and management of safe diet progression.   Impact on safety and function Moderate aspiration risk     Prognosis 06/29/2015 Prognosis for Safe Diet Advancement Good Barriers to Reach Goals -- Barriers/Prognosis Comment -- CHL IP DIET RECOMMENDATION 06/29/2015 SLP Diet Recommendations Dysphagia 1 (Puree)  solids;Nectar thick liquid Liquid Administration via Cup Medication Administration Crushed with puree Compensations Slow  rate;Small sips/bites;Clear throat intermittently;Multiple dry swallows after each bite/sip Postural Changes --   No flowsheet data found.         CHL IP ORAL PHASE 06/29/2015 Oral Phase Impaired Oral - Pudding Teaspoon -- Oral - Pudding Cup -- Oral - Honey Teaspoon -- Oral - Honey Cup -- Oral - Nectar Teaspoon Weak lingual manipulation;Right anterior bolus loss;Reduced posterior propulsion;Lingual/palatal residue;Delayed oral transit;Decreased bolus cohesion;Premature spillage Oral - Nectar Cup Right anterior bolus loss;Weak lingual manipulation;Lingual/palatal residue;Delayed oral transit;Decreased bolus cohesion;Premature spillage;Reduced posterior propulsion Oral - Nectar Straw Weak lingual manipulation;Lingual/palatal residue;Decreased bolus cohesion;Premature spillage Oral - Thin Teaspoon Right anterior bolus loss;Weak lingual manipulation;Reduced posterior propulsion;Lingual/palatal residue;Premature spillage;Delayed oral transit;Decreased bolus cohesion Oral - Thin Cup Weak lingual manipulation;Premature spillage;Lingual/palatal residue;Reduced posterior propulsion;Right anterior bolus loss;Delayed oral transit;Decreased bolus cohesion Oral - Thin Straw -- Oral - Puree Right anterior bolus loss;Weak lingual manipulation;Reduced posterior propulsion;Lingual/palatal residue;Delayed oral transit;Decreased bolus cohesion;Premature spillage Oral - Mech Soft -- Oral - Regular -- Oral - Multi-Consistency -- Oral - Pill -- Oral Phase - Comment --  CHL IP PHARYNGEAL PHASE 06/29/2015 Pharyngeal Phase Impaired Pharyngeal- Pudding Teaspoon -- Pharyngeal -- Pharyngeal- Pudding Cup -- Pharyngeal -- Pharyngeal- Honey Teaspoon -- Pharyngeal -- Pharyngeal- Honey Cup -- Pharyngeal -- Pharyngeal- Nectar Teaspoon Delayed swallow initiation-vallecula;Reduced anterior laryngeal mobility;Reduced laryngeal  elevation;Reduced tongue base retraction;Reduced airway/laryngeal closure;Pharyngeal residue - posterior pharnyx;Pharyngeal residue - valleculae Pharyngeal -- Pharyngeal- Nectar Cup Delayed swallow initiation-pyriform sinuses;Reduced anterior laryngeal mobility;Reduced laryngeal elevation;Reduced airway/laryngeal closure;Reduced tongue base retraction;Pharyngeal residue - valleculae;Pharyngeal residue - posterior pharnyx;Penetration/Aspiration during swallow Pharyngeal Material enters airway, CONTACTS cords and then ejected out Pharyngeal- Nectar Straw Reduced anterior laryngeal mobility;Reduced laryngeal elevation;Reduced airway/laryngeal closure;Reduced tongue base retraction;Pharyngeal residue - valleculae;Pharyngeal residue - posterior pharnyx;Pharyngeal residue - pyriform Pharyngeal -- Pharyngeal- Thin Teaspoon Delayed swallow initiation-vallecula;Reduced anterior laryngeal mobility;Reduced laryngeal elevation;Reduced airway/laryngeal closure;Reduced tongue base retraction;Pharyngeal residue - valleculae;Pharyngeal residue - pyriform;Penetration/Aspiration during swallow Pharyngeal Material enters airway, CONTACTS cords and then ejected out Pharyngeal- Thin Cup Delayed swallow initiation-pyriform sinuses;Reduced anterior laryngeal mobility;Reduced laryngeal elevation;Reduced airway/laryngeal closure;Reduced tongue base retraction;Penetration/Aspiration during swallow;Pharyngeal residue - valleculae;Pharyngeal residue - pyriform Pharyngeal Material enters airway, passes BELOW cords without attempt by patient to eject out (silent aspiration) Pharyngeal- Thin Straw -- Pharyngeal -- Pharyngeal- Puree Delayed swallow initiation-vallecula;Reduced anterior laryngeal mobility;Reduced laryngeal elevation;Reduced airway/laryngeal closure;Reduced tongue base retraction;Pharyngeal residue - valleculae;Pharyngeal residue - pyriform Pharyngeal -- Pharyngeal- Mechanical Soft -- Pharyngeal -- Pharyngeal- Regular -- Pharyngeal  -- Pharyngeal- Multi-consistency -- Pharyngeal -- Pharyngeal- Pill -- Pharyngeal -- Pharyngeal Comment --  No flowsheet data found. No flowsheet data found. Page, [name] 06/29/2015, 3:21 PM                CBC  Recent Labs Lab 07/04/15 0504  07/04/15 1725 07/04/15 2100 07/05/15 0443 07/06/15 0457 07/07/15 0530  WBC 4.2  --  4.1  --  5.4 5.7 5.6  HGB 7.3*  --  8.2* 9.0* 8.9* 8.9* 8.8*  HCT 24.3*  < > 26.4* 28.9* 28.9* 28.4* 28.5*  PLT 201  --  199  --  193 193 183  MCV 87.1  --  87.7  --  85.5 85.3 85.3  MCH 26.2  --  27.2  --  26.3 26.7 26.3  MCHC 30.0  --  31.1  --  30.8 31.3 30.9  RDW 17.9*  --  17.6*  --  17.8* 17.3* 17.4*  LYMPHSABS  --   --   --   --  0.7  --   --  MONOABS  --   --   --   --  0.7  --   --   EOSABS  --   --   --   --  0.2  --   --   BASOSABS  --   --   --   --  0.0  --   --   < > = values in this interval not displayed.  Chemistries   Recent Labs Lab 07/01/15 0501 07/05/15 0443  NA 135 134*  K 4.7 4.5  CL 106 105  CO2 21* 22  GLUCOSE 146* 128*  BUN <5* <5*  CREATININE 1.14 1.14  CALCIUM 9.3 9.6   ------------------------------------------------------------------------------------------------------------------ estimated creatinine clearance is 61.5 mL/min (by C-G formula based on Cr of 1.14). ------------------------------------------------------------------------------------------------------------------ No results for input(s): HGBA1C in the last 72 hours. ------------------------------------------------------------------------------------------------------------------ No results for input(s): CHOL, HDL, LDLCALC, TRIG, CHOLHDL, LDLDIRECT in the last 72 hours. ------------------------------------------------------------------------------------------------------------------ No results for input(s): TSH, T4TOTAL, T3FREE, THYROIDAB in the last 72 hours.  Invalid input(s):  FREET3 ------------------------------------------------------------------------------------------------------------------  Recent Labs  07/04/15 1725  VITAMINB12 267  FERRITIN 21*  TIBC 340  IRON 26*    Coagulation profile No results for input(s): INR, PROTIME in the last 168 hours.  No results for input(s): DDIMER in the last 72 hours.  Cardiac Enzymes No results for input(s): CKMB, TROPONINI, MYOGLOBIN in the last 168 hours.  Invalid input(s): CK ------------------------------------------------------------------------------------------------------------------ Invalid input(s): POCBNP   CBG:  Recent Labs Lab 07/06/15 0603 07/06/15 1218 07/06/15 1635 07/06/15 2100 07/07/15 0647  GLUCAP 157* 146* 116* 117* 136*       Studies: Dg Chest Port 1 View  07/06/2015  CLINICAL DATA:  COUGH.HX MI,CA EXAM: PORTABLE CHEST 1 VIEW COMPARISON:  07/02/2015 FINDINGS: Heart size is enlarged. Right-sided Port-A-Cath tip to the level of the lower superior vena cava. There is patchy density in the left upper lobe, stable compared with prior study. Mild interstitial edema is noted. IMPRESSION: 1. Persistent left upper lobe density. 2. Mild interstitial edema. Electronically Signed   By: Nolon Nations M.D.   On: 07/06/2015 14:09      Lab Results  Component Value Date   HGBA1C 6.8* 06/14/2015   HGBA1C 7.4* 04/17/2015   HGBA1C 8.0* 05/22/2013   Lab Results  Component Value Date   LDLCALC 145* 06/14/2015   CREATININE 1.14 07/05/2015       Scheduled Meds: . amiodarone  200 mg Oral Daily  . antiseptic oral rinse  7 mL Mouth Rinse BID  . atorvastatin  40 mg Oral q1800  . diltiazem  60 mg Oral BID  . glimepiride  1 mg Oral QAC breakfast  . insulin aspart  0-24 Units Subcutaneous TID AC & HS  . iron polysaccharides  150 mg Oral Daily  . lactose free nutrition  237 mL Oral TID WC  . levETIRAcetam  1,000 mg Intravenous Q12H  . metFORMIN  1,000 mg Oral Q breakfast  . metFORMIN   500 mg Oral Q supper  . pantoprazole sodium  40 mg Oral Daily  . rivaroxaban  15 mg Oral Q supper  . vitamin B-12  1,000 mcg Oral Daily   Continuous Infusions:     LOS: 16 days    Time spent: >30 [facility] Pager [phone]. If 7PM-7AM, please contact night-coverage at [email or web address], password [email or web address] 07/07/2015, 10:04 AM  LOS: 16 days

## 2015-07-07 NOTE — Plan of Care (Signed)
Problem: RH Ambulation Goal: LTG Patient will ambulate in controlled environment (PT) LTG: Patient will ambulate in a controlled environment, # of feet with assistance (PT).  Downgraded d/t slow progress  Problem: RH Wheelchair Mobility Goal: LTG Patient will propel w/c in controlled environment (PT) LTG: Patient will propel wheelchair in controlled environment, # of feet with assist (PT)  Outcome: Not Applicable Date Met:  42/10/31 D/c; pt in tilt-in-space w/c, unable to propel

## 2015-07-07 NOTE — Patient Care Conference (Signed)
Inpatient RehabilitationTeam Conference and Plan of Care Update Date: 07/07/2015   Time: 11:05 AM    Patient Name: Ivan Beasley      Medical Record Number: [id number]  Date of Birth: 1940/03/03 Sex: Male         Room/Bed: 4W11C/4W11C-01 Payor Info: Payor: [employer] MEDICARE / Plan: [employer] MEDICARE HMO / Product Type: *No Product type* /    Admitting Diagnosis: R-ICH  Admit Date/Time:  07/05/2015  4:45 PM Admission Comments: No comment available   Primary Diagnosis:  ICH (intracerebral hemorrhage) (HCC) Principal Problem: ICH (intracerebral hemorrhage) ([place])  Patient Active Problem List   Diagnosis Date Noted  . Adjustment disorder with mixed anxiety and depressed mood   . Chronic anticoagulation   . Malignant neoplasm of left lung ([place])   . Cerebrovascular accident (CVA) due to embolism of left middle cerebral artery ([place])   . SAH (subarachnoid hemorrhage) ([place]) 06/19/2015  . Coagulopathy ([place]) - eliquis related 06/23/2015  . Brain metastases ([place]), possible 06/29/2015  . Seizures ([place]), possible 06/15/2015  . Asystole ([place]) 06/16/2015  . Hypophosphatemia 06/10/2015  . Hypomagnesemia 07/08/2015  . CKD (chronic kidney disease), stage III 06/17/2015  . Anemia 06/18/2015  . Right hemiparesis ([place]) 06/17/2015  . Cough   . Idiopathic hypotension   . Lung cancer ([place])   . ICH (intracerebral hemorrhage) ([place])- L frontal ICH 06/13/2015  . Paroxysmal atrial fibrillation ([place]) 05/13/2015  . PNA (pneumonia) 05/13/2015  . HCAP (healthcare-associated pneumonia)   . Pneumonitis   . Acute respiratory failure ([place])   . Acute on chronic respiratory failure with hypoxia ([place]) 04/17/2015  . Respiratory failure ([place]) 04/17/2015  . Cancer of lung ([place]) 05/17/2014  . COPD, mild ([place]) 08/29/2013  . Cough, persistent 08/29/2013  . Mild chronic obstructive pulmonary disease ([place]) 08/29/2013  . H/O cardiac catheterization 07/29/2013  . Breath shortness 06/23/2013  . Chest pain 06/23/2013  .  BP (high blood pressure) 06/20/2013  . Diabetes ([place]) 06/20/2013  . HLD (hyperlipidemia) 06/20/2013  . H/O coronary artery bypass surgery 06/20/2013  . Diabetes mellitus ([place]) 06/20/2013  . H/O total knee replacement 06/03/2013    Expected Discharge Date: Expected Discharge Date: 07/15/15  Team Members Present: Physician leading conference: Dr. Delice Lesch Social Worker Present: Alfonse Alpers, LCSW Nurse Present: Junius Creamer, RN PT Present: Kem Parkinson, PT OT Present: [name], OT SLP Present: Windell Moulding, SLP PPS Coordinator present : Daiva Nakayama, RN, CRRN     Current Status/Progress Goal Weekly Team Focus  Medical   Some improvement in swallowing as well as lower extremity strength. Now on Xarelto no evidence of hemorrhage. Anemia is stable  Avoid further ischemic strokes but also avoid recurrent bleeds,  avoid anemia worsening  Continue intensive monitoring of hemoglobin   Bowel/Bladder   incontinent of bowel & bladder  less episodes of incontinence  timed toileting, I & O cath w/ PVR   Swallow/Nutrition/ Hydration   Dys 1, nectar thick liquids; full supervision   supervision for least restrictive diet   continue to address diet toleration    ADL's   continues to need +2 a with self care and transfers. Max to total A overall, LUE flaccid with subluxation  Min assist overall  sitting balance, transfers, trunk control, ADL retraining, RUE NMR and positioning   Mobility   modA bed mobility, +2 slideboard transfer, +2 gait, modA sitting balance  minA overall  bed mobility, sitting balance, transfers, gait   Communication   mod assist for intelligibility at the  phrase level   min assist in sentences   continue to address intelligibility    Safety/Cognition/ Behavioral Observations  mod-max assist depending on fatigue  min assist   basic cognition    Pain   c/o back pain last night  <2 pain scale score  continue to assess & treat pain prn   Skin   MASD to perineal  area & buttocks   resolving prior to discharge, no new skin breakdown  continue to assess Q shift    Rehab Goals Patient on target to meet rehab goals: Yes Rehab Goals Revised: some of pt's goals will be downgraded *See Care Plan and progress notes for long and short-term goals.  Barriers to Discharge:  however will need to avoid excessive blood drawsStroke extension    Possible Resolutions to Barriers:  , see above    Discharge Planning/Teaching Needs:  Pt's level of care has been increased to max to +2 assistance and pt's wife may not be able to provide this level of care and team is concerned that pt may need to go to a SNF.  [name] is here regularly and will participate in family education closer to d/c, if plan for pt to go home remains.   Team Discussion:  Pt is doing slightly better and has been switched to oral coagulation.  His hemoglobin is stable and he has no signs of a GI bleed.  Pt to f/u with Oncology and Neurology.  Pt is max assist to 2 person assist with therapies.  Pt does best with 30-45 ST sessions.  He really tries hard, but fatigues at the end.    Revisions to Treatment Plan:  Pt's schedule to be changed to 15 hours over 7 days.   Continued Need for Acute Rehabilitation Level of Care: The patient requires daily medical management by a physician with specialized training in physical medicine and rehabilitation for the following conditions: Daily medical management of patient stability for increased activity during participation in an intensive rehabilitation regime.: Yes Daily analysis of laboratory values and/or radiology reports with any subsequent need for medication adjustment of medical intervention for : Cardiac problems;Neurological problems;Other  Ifeanyi Mickelson, Silvestre Mesi 07/07/2015, 11:21 PM

## 2015-07-07 NOTE — Progress Notes (Signed)
Occupational Therapy Weekly Progress Note  Patient Details  Name: Ivan Beasley MRN: [id number] Date of Birth: [birth year]  Beginning of progress report period: June 30, 2015 End of progress report period: July 07, 2015  Today's Date: 07/07/2015 OT Individual Time: 1130-1200 OT Individual Time Calculation (min): 30 min    Patient has met 0 of 5 short term goals.  Pt is making gradual progress with his activity tolerance, head/ neck control and sitting balance, but continues to need total A with most mobility, self care and max A of 2 with slide board transfers.   Patient continues to demonstrate the following deficits: dense R hemiplegia, neck pain, very impaired activity tolerance, decreased attention, decreased trunk control and therefore will continue to benefit from skilled OT intervention to enhance overall performance with BADL.  Patient not progressing toward long term goals.  See goal revision..  Plan of care revisions:  Problem: RH Balance Goal: LTG: Patient will maintain dynamic sitting balance (OT) LTG: Patient will maintain dynamic sitting balance with assistance during activities of daily living (OT)  LTG downgraded from S to min A due to limited progress.  Goal: LTG Patient will maintain dynamic standing with ADLs (OT) LTG: Patient will maintain dynamic standing balance with assist during activities of daily living (OT)  LTG downgraded from min A to max A due to limited progress.  Problem: RH Bathing Goal: LTG Patient will bathe with assist, cues/equipment (OT) LTG: Patient will bathe specified number of body parts with assist with/without cues using equipment (position) (OT)  LTG downgraded from min A to max A due to limited progress.  Problem: RH Dressing Goal: LTG Patient will perform upper body dressing (OT) LTG Patient will perform upper body dressing with assist, with/without cues (OT).  LTG downgraded from min A to mod A due to limited progress. Goal:  LTG Patient will perform lower body dressing w/assist (OT) LTG: Patient will perform lower body dressing with assist, with/without cues in positioning using equipment (OT)  LTG downgraded from min A to max A due to limited progress.  Problem: RH Toileting Goal: LTG Patient will perform toileting w/assist, cues/equip (OT) LTG: Patient will perform toiletiing (clothes management/hygiene) with assist, with/without cues using equipment (OT)  LTG discontinued due to limited progress.  Problem: RH Functional Use of Upper Extremity Goal: LTG Patient will use RT/LT upper extremity as a (OT) LTG: Patient will use right/left upper extremity as a stabilizer/gross assist/diminished/nondominant/dominant level with assist, with/without cues during functional activity (OT)  LTG downgraded from S to mod A due to limited progress.  Problem: RH Toilet Transfers Goal: LTG Patient will perform toilet transfers w/assist (OT) LTG: Patient will perform toilet transfers with assist, with/without cues using equipment (OT)  LTG discontinued due to limited progress.  Problem: RH Tub/Shower Transfers Goal: LTG Patient will perform tub/shower transfers w/assist (OT) LTG: Patient will perform tub/shower transfers with assist, with/without cues using equipment (OT)  LTG discontinued due to limited progress.  OT Short Term Goals Week 1:  OT Short Term Goal 1 (Week 1): Pt will demonstrate increased awareness to RUE by positioning appropriately to decrease shoulder subluxation OT Short Term Goal 1 - Progress (Week 1): Not met OT Short Term Goal 2 (Week 1): Pt will completed squat pivot transfer to toilet with max assist of one caregiver OT Short Term Goal 2 - Progress (Week 1): Not met OT Short Term Goal 3 (Week 1): Pt will complete UB dressing with mod assist OT Short Term Goal 3 -  Progress (Week 1): Not met OT Short Term Goal 4 (Week 1): Pt will complete LB dressing with max assist of one caregiver at sit >  stand level OT Short Term Goal 4 - Progress (Week 1): Not met OT Short Term Goal 5 (Week 1): Pt will complete bathing with mod assist at sit > stand level OT Short Term Goal 5 - Progress (Week 1): Not met Week 2:  OT Short Term Goal 1 (Week 2): Pt will demonstrate increased awareness to RUE by positioning appropriately to decrease shoulder subluxation OT Short Term Goal 1 - Progress (Week 2): Progressing toward goal OT Short Term Goal 2 (Week 2): Pt will complete squat pivot transfer to toilet with max assist of one caregiver OT Short Term Goal 2 - Progress (Week 2): Not progressing OT Short Term Goal 3 (Week 2): Pt will complete UB dressing with mod assist in unsupported sitting OT Short Term Goal 3 - Progress (Week 2): Progressing toward goal OT Short Term Goal 4 (Week 2): Pt will complete LB dressing with max assist of one caregiver at bed level OT Short Term Goal 4 - Progress (Week 2): Progressing toward goal OT Short Term Goal 5 (Week 2): Pt will complete bathing with mod assist at bed level OT Short Term Goal 5 - Progress (Week 2): Progressing toward goal Week 3:  OT Short Term Goal 1 (Week 3): STGs = LTGs based on modified LTGs  Skilled Therapeutic Interventions/Progress Updates:    Pt seen this session to work on sit to stand skills using Stedy with A from rehab tech. Total A to stand and pt tolerated 20 sec the first time, 30 seconds the 2nd time.  Pt very fatigued. From w/c, worked on R visual scanning, head/ neck extension to neutral,  RUE PROM and attempts to facilitate movement with hand over hand A to hold cup in R hand to drink thickened juice. Pt resting in chair with spouse in room at end of session.  Therapy Documentation Precautions:  Precautions Precautions: Fall Precaution Comments: R hemi, pt on home O2 PTA for recent pneumonia (but used it PRN, not 24/7) Restrictions Weight Bearing Restrictions: No      Pain: c/o fatigue and neck pain   ADL:   See Function  Navigator for Current Functional Status.   Therapy/Group: Individual Therapy  Briante Loveall 07/07/2015, 1:13 PM

## 2015-07-07 NOTE — Progress Notes (Signed)
Physical Therapy Session Note  Patient Details  Name: Ivan Beasley MRN: [id number] Date of Birth: 01-30-1940  Today's Date: 07/07/2015 PT Individual Time: 1520-1620 PT Individual Time Calculation (min): 60 min   Short Term Goals: Week 2:  PT Short Term Goal 1 (Week 2): Pt will perform supine<>sit with modA PT Short Term Goal 2 (Week 2): Pt will perform transfer w/c <>bed with maxA +1 PT Short Term Goal 3 (Week 2): Pt will perform sit <>stand maxA PT Short Term Goal 4 (Week 2): Pt will demonstrate sitting balance x3 min with minA PT Short Term Goal 5 (Week 2): Pt will initiate gait training  Skilled Therapeutic Interventions/Progress Updates:   Session focused on discharge planning with CSW, patient, and patient's wife, bed mobility training with max A overall and mod multimodal cues for sequencing and technique, min-mod A for sitting balance EOB with assist for L hand placement to decrease pushing, slide board transfers with +2A for safety, transfers via maxi move with +2A from wheelchair <> cardiac tilt table, and neuro re-ed via cardiac tilt table to address midline re-orientation, decreasing pushing tendencies, weightbearing through R LE, postural control, and gross trunk, hip, and knee extension to neutral. Patient required max verbal cues for upright head and forward gaze and tolerated tilt table up to 60 degrees x 25 minutes. Patient requested to return to bed at end of session, left semi reclined with RUE supported on pillow and needs in reach.   Therapy Documentation Precautions:  Precautions Precautions: Fall Precaution Comments: R hemi, pt on home O2 PTA for recent pneumonia (but used it PRN, not 24/7) Restrictions Weight Bearing Restrictions: No Pain: Pain Assessment Pain Assessment: Faces Faces Pain Scale: Hurts even more Pain Type: Acute pain Pain Location: Knee Pain Orientation: Left Pain Descriptors / Indicators: Aching;Grimacing Pain Onset: On-going Pain  Intervention(s): Repositioned;Other (Comment) (brace applied)   See Function Navigator for Current Functional Status.   Therapy/Group: Individual Therapy  Laretta Alstrom 07/07/2015, 4:35 PM

## 2015-07-07 NOTE — Plan of Care (Signed)
Problem: RH Balance Goal: LTG Patient will maintain dynamic sitting balance (PT) LTG: Patient will maintain dynamic sitting balance with assistance during mobility activities (PT)  Downgraded d/t slow progress Goal: LTG Patient will maintain dynamic standing balance (PT) LTG: Patient will maintain dynamic standing balance with assistance during mobility activities (PT)  Outcome: Not Applicable Date Met:  97/33/12 D/c; pt to d/c at w/c level  Problem: RH Bed to Chair Transfers Goal: LTG Patient will perform bed/chair transfers w/assist (PT) LTG: Patient will perform bed/chair transfers with assistance, with/without cues (PT).  Downgraded d/t slow progress  Problem: RH Car Transfers Goal: LTG Patient will perform car transfers with assist (PT) LTG: Patient will perform car transfers with assistance (PT).  Outcome: Not Applicable Date Met:  50/87/19 D/c due to slow progress  Problem: RH Furniture Transfers Goal: LTG Patient will perform furniture transfers w/assist (OT/PT LTG: Patient will perform furniture transfers with assistance (OT/PT).  Outcome: Not Applicable Date Met:  94/12/90 D/c d/t pt at w/c level

## 2015-07-07 NOTE — Progress Notes (Signed)
Subjective/Complaints: Appreciate internal medicine note Discussed workup with patient and wife today. Participating in Therapy Review of systems aphasic limiting review Objective: Vital Signs: Blood pressure 135/58, pulse 87, temperature 97.8 F (36.6 C), temperature source Oral, resp. rate 19, weight 92.126 kg (203 lb 1.6 oz), SpO2 99 %. Dg Chest Port 1 View  07/06/2015  CLINICAL DATA:  COUGH.HX MI,CA EXAM: PORTABLE CHEST 1 VIEW COMPARISON:  07/02/2015 FINDINGS: Heart size is enlarged. Right-sided Port-A-Cath tip to the level of the lower superior vena cava. There is patchy density in the left upper lobe, stable compared with prior study. Mild interstitial edema is noted. IMPRESSION: 1. Persistent left upper lobe density. 2. Mild interstitial edema. Electronically Signed   By: Nolon Nations M.D.   On: 07/06/2015 14:09   Results for orders placed or performed during the hospital encounter of 07/03/2015 (from the past 72 hour(s))  Glucose, capillary     Status: Abnormal   Collection Time: 07/04/15 11:55 AM  Result Value Ref Range   Glucose-Capillary 104 (H) 65 - 99 mg/dL  Occult blood card to lab, stool Provider will collect     Status: None   Collection Time: 07/04/15 12:14 PM  Result Value Ref Range   Fecal Occult Bld NEGATIVE NEGATIVE  Prepare RBC     Status: None   Collection Time: 07/04/15 12:55 PM  Result Value Ref Range   Order Confirmation ORDER PROCESSED BY BLOOD BANK   Type and screen [place]     Status: None   Collection Time: 07/04/15  2:15 PM  Result Value Ref Range   ABO/RH(D) O NEG    Antibody Screen NEG    Sample Expiration 07/07/2015    Unit Number [id number]    Blood Component Type RED CELLS,LR    Unit division 00    Status of Unit ISSUED,FINAL    Transfusion Status OK TO TRANSFUSE    Crossmatch Result Compatible   Folate RBC     Status: Abnormal   Collection Time: 07/04/15  2:15 PM  Result Value Ref Range   Folate, Hemolysate  273.8 Not Estab. ng/mL   Hematocrit 24.1 (L) 37.5 - 51.0 %   Folate, RBC 1136 >498 ng/mL    Comment: (NOTE) Performed At: [facility] [place], [place] [id number] Lindon Romp MD KC:[id number]   ABO/Rh     Status: None   Collection Time: 07/04/15  2:15 PM  Result Value Ref Range   ABO/RH(D) O NEG   Glucose, capillary     Status: Abnormal   Collection Time: 07/04/15  4:26 PM  Result Value Ref Range   Glucose-Capillary 120 (H) 65 - 99 mg/dL  CBC     Status: Abnormal   Collection Time: 07/04/15  5:25 PM  Result Value Ref Range   WBC 4.1 4.0 - 10.5 K/uL   RBC 3.01 (L) 4.22 - 5.81 MIL/uL   Hemoglobin 8.2 (L) 13.0 - 17.0 g/dL   HCT 26.4 (L) 39.0 - 52.0 %   MCV 87.7 78.0 - 100.0 fL   MCH 27.2 26.0 - 34.0 pg   MCHC 31.1 30.0 - 36.0 g/dL   RDW 17.6 (H) 11.5 - 15.5 %   Platelets 199 150 - 400 K/uL  Iron and TIBC     Status: Abnormal   Collection Time: 07/04/15  5:25 PM  Result Value Ref Range   Iron 26 (L) 45 - 182 ug/dL   TIBC 340 250 - 450 ug/dL  Saturation Ratios 8 (L) 17.9 - 39.5 %   UIBC 314 ug/dL  Ferritin     Status: Abnormal   Collection Time: 07/04/15  5:25 PM  Result Value Ref Range   Ferritin 21 (L) 24 - 336 ng/mL  Vitamin B12     Status: None   Collection Time: 07/04/15  5:25 PM  Result Value Ref Range   Vitamin B-12 267 180 - 914 pg/mL    Comment: (NOTE) This assay is not validated for testing neonatal or myeloproliferative syndrome specimens for Vitamin B12 levels.   Glucose, capillary     Status: None   Collection Time: 07/04/15  8:43 PM  Result Value Ref Range   Glucose-Capillary 87 65 - 99 mg/dL  Hemoglobin and hematocrit, blood     Status: Abnormal   Collection Time: 07/04/15  9:00 PM  Result Value Ref Range   Hemoglobin 9.0 (L) 13.0 - 17.0 g/dL   HCT 28.9 (L) 39.0 - 52.0 %  CBC with Differential/Platelet     Status: Abnormal   Collection Time: 07/05/15  4:43 AM  Result Value Ref Range   WBC 5.4 4.0 - 10.5 K/uL   RBC 3.38  (L) 4.22 - 5.81 MIL/uL   Hemoglobin 8.9 (L) 13.0 - 17.0 g/dL   HCT 28.9 (L) 39.0 - 52.0 %   MCV 85.5 78.0 - 100.0 fL   MCH 26.3 26.0 - 34.0 pg   MCHC 30.8 30.0 - 36.0 g/dL   RDW 17.8 (H) 11.5 - 15.5 %   Platelets 193 150 - 400 K/uL   Neutrophils Relative % 71 %   Neutro Abs 3.8 1.7 - 7.7 K/uL   Lymphocytes Relative 13 %   Lymphs Abs 0.7 0.7 - 4.0 K/uL   Monocytes Relative 12 %   Monocytes Absolute 0.7 0.1 - 1.0 K/uL   Eosinophils Relative 4 %   Eosinophils Absolute 0.2 0.0 - 0.7 K/uL   Basophils Relative 0 %   Basophils Absolute 0.0 0.0 - 0.1 K/uL  Basic metabolic panel     Status: Abnormal   Collection Time: 07/05/15  4:43 AM  Result Value Ref Range   Sodium 134 (L) 135 - 145 mmol/L   Potassium 4.5 3.5 - 5.1 mmol/L   Chloride 105 101 - 111 mmol/L   CO2 22 22 - 32 mmol/L   Glucose, Bld 128 (H) 65 - 99 mg/dL   BUN <5 (L) 6 - 20 mg/dL   Creatinine, Ser 1.14 0.61 - 1.24 mg/dL   Calcium 9.6 8.9 - 10.3 mg/dL   GFR calc non Af Amer >60 >60 mL/min   GFR calc Af Amer >60 >60 mL/min    Comment: (NOTE) The eGFR has been calculated using the CKD EPI equation. This calculation has not been validated in all clinical situations. eGFR's persistently <60 mL/min signify possible Chronic Kidney Disease.    Anion gap 7 5 - 15  Glucose, capillary     Status: Abnormal   Collection Time: 07/05/15  6:21 AM  Result Value Ref Range   Glucose-Capillary 120 (H) 65 - 99 mg/dL  Glucose, capillary     Status: Abnormal   Collection Time: 07/05/15 11:59 AM  Result Value Ref Range   Glucose-Capillary 120 (H) 65 - 99 mg/dL  Glucose, capillary     Status: Abnormal   Collection Time: 07/05/15  5:20 PM  Result Value Ref Range   Glucose-Capillary 146 (H) 65 - 99 mg/dL  Glucose, capillary     Status: Abnormal  Collection Time: 07/05/15  9:17 PM  Result Value Ref Range   Glucose-Capillary 132 (H) 65 - 99 mg/dL  CBC     Status: Abnormal   Collection Time: 07/06/15  4:57 AM  Result Value Ref Range    WBC 5.7 4.0 - 10.5 K/uL   RBC 3.33 (L) 4.22 - 5.81 MIL/uL   Hemoglobin 8.9 (L) 13.0 - 17.0 g/dL   HCT 28.4 (L) 39.0 - 52.0 %   MCV 85.3 78.0 - 100.0 fL   MCH 26.7 26.0 - 34.0 pg   MCHC 31.3 30.0 - 36.0 g/dL   RDW 17.3 (H) 11.5 - 15.5 %   Platelets 193 150 - 400 K/uL  Glucose, capillary     Status: Abnormal   Collection Time: 07/06/15  6:03 AM  Result Value Ref Range   Glucose-Capillary 157 (H) 65 - 99 mg/dL  Glucose, capillary     Status: Abnormal   Collection Time: 07/06/15 12:18 PM  Result Value Ref Range   Glucose-Capillary 146 (H) 65 - 99 mg/dL  Glucose, capillary     Status: Abnormal   Collection Time: 07/06/15  4:35 PM  Result Value Ref Range   Glucose-Capillary 116 (H) 65 - 99 mg/dL  Glucose, capillary     Status: Abnormal   Collection Time: 07/06/15  9:00 PM  Result Value Ref Range   Glucose-Capillary 117 (H) 65 - 99 mg/dL  Occult blood card to lab, stool RN will collect     Status: None   Collection Time: 07/07/15  2:37 AM  Result Value Ref Range   Fecal Occult Bld NEGATIVE NEGATIVE  CBC     Status: Abnormal   Collection Time: 07/07/15  5:30 AM  Result Value Ref Range   WBC 5.6 4.0 - 10.5 K/uL   RBC 3.34 (L) 4.22 - 5.81 MIL/uL   Hemoglobin 8.8 (L) 13.0 - 17.0 g/dL   HCT 28.5 (L) 39.0 - 52.0 %   MCV 85.3 78.0 - 100.0 fL   MCH 26.3 26.0 - 34.0 pg   MCHC 30.9 30.0 - 36.0 g/dL   RDW 17.4 (H) 11.5 - 15.5 %   Platelets 183 150 - 400 K/uL  Glucose, capillary     Status: Abnormal   Collection Time: 07/07/15  6:47 AM  Result Value Ref Range   Glucose-Capillary 136 (H) 65 - 99 mg/dL     HEENT: normal Cardio: irregular irregular Resp: CTA B/L and unlabored GI: BS positive and nontender nondistended Extremity:  No Edema Skin:   Intact Neuro: Flat, Abnormal Sensory cannot assess secondary to aphasia, Abnormal Motor 2 minus finger flexors, biceps and triceps , 0 at deltoid, 3 minus at the hip knee extensors as well as plantar flexors. 0 at the ankle dorsiflexors.,  Dysarthric and Aphasic, Mild facial droop Musc/Skel:  Normal Gen. no acute distress   Assessment/Plan: 1. 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 Physiatrist is providing close team supervision and 24 hour management of active medical problems listed below. Physiatrist and rehab team continue to assess barriers to discharge/monitor patient progress toward functional and medical goals. FIM: Function - Bathing Position: Bed Body parts bathed by patient: Chest, Abdomen, Right arm, Front perineal area Body parts bathed by helper: Back, Buttocks Bathing not applicable: Right upper leg, Left upper leg, Right lower leg, Left lower leg Assist Level: 2 helpers (Total assist)  Function- Upper Body  Dressing/Undressing What is the patient wearing?: Pull over shirt/dress Pull over shirt/dress - Perfomed by patient: Thread/unthread left sleeve, Put head through opening Pull over shirt/dress - Perfomed by helper: Pull shirt over trunk, Thread/unthread right sleeve Assist Level: 2 helpers (Max assist, +2 for sitting balance) Function - Lower Body Dressing/Undressing What is the patient wearing?: Pants, Non-skid slipper socks Position: Bed Pants- Performed by helper: Thread/unthread right pants leg, Thread/unthread left pants leg, Pull pants up/down Non-skid slipper socks- Performed by helper: Don/doff right sock, Don/doff left sock Assist for footwear: Dependant Assist for lower body dressing:  (Total assist)  Function - Toileting Toileting activity did not occur: No continent bowel/bladder event Assist level: Two helpers (per [employer], NT)  Function - Air cabin crew transfer activity did not occur: Safety/medical concerns Assist level to toilet: 2 helpers (per Berkley Harvey, NT) Assist level from toilet: 2 helpers  Function -  Chair/bed transfer Chair/bed transfer activity did not occur: Safety/medical concerns Chair/bed transfer method: Lateral scoot Chair/bed transfer assist level: Maximal assist (Pt 25 - 49%/lift and lower) Chair/bed transfer assistive device: Armrests, Sliding board Mechanical lift: Stedy Chair/bed transfer details: Verbal cues for precautions/safety, Manual facilitation for weight shifting, Manual facilitation for placement, Verbal cues for sequencing, Verbal cues for technique, Tactile cues for placement, Tactile cues for posture, Tactile cues for weight shifting  Function - Locomotion: Wheelchair Will patient use wheelchair at discharge?: Yes Type: Manual Max wheelchair distance: 75 Assist Level: Maximal assistance (Pt 25 - 49%) Assist Level: Maximal assistance (Pt 25 - 49%) Wheel 150 feet activity did not occur: Safety/medical concerns Turns around,maneuvers to table,bed, and toilet,negotiates 3% grade,maneuvers on rugs and over doorsills: No Function - Locomotion: Ambulation Ambulation activity did not occur: Safety/medical concerns Assistive device: Other (comment) (three muskateers) Max distance: 25 Assist level: 2 helpers Assist level: 2 helpers  Function - Comprehension Comprehension: Auditory Comprehension assistive device: Hearing aids Comprehension assist level: Understands basic 75 - 89% of the time/ requires cueing 10 - 24% of the time  Function - Expression Expression: Nonverbal Expression assist level: Expresses basic 50 - 74% of the time/requires cueing 25 - 49% of the time. Needs to repeat parts of sentences.  Function - Social Interaction Social Interaction assist level: Interacts appropriately 50 - 74% of the time - May be physically or verbally inappropriate.  Function - Problem Solving Problem solving assist level: Solves basic 25 - 49% of the time - needs direction more than half the time to initiate, plan or complete simple activities  Function -  Memory Memory assist level: Recognizes or recalls 25 - 49% of the time/requires cueing 50 - 75% of the time Patient normally able to recall (first 3 days only): Current season    Medical Problem List and Plan: 1.  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 Plan to follow-up MRI of the brain after ICH absorbed for further evaluation of possible metastases posterior left frontal lobe. Team conference today please see physician documentation under team conference tab, met with team face-to-face to discuss problems,progress, and goals. Formulized individual treatment plan based on medical history, underlying problem and comorbidities. Plan to repeat MRI next week prior to discharge with neurology to follow up, Xarelto for stroke prophylaxis           2.  DVT Prophylaxis/Anticoagulation: SCDs. Monitor for signs of DVT, Xarelto should help with this as well 3. Pain Management: Ultram and oxycodone as needed 4. Mood: Xanax 0.25 mg 3  times daily as needed 5. Neuropsych: This patient is not capable of making decisions on his own behalf with caregiver/staff assistance. 6. Skin/Wound Care: Routine skin checks 7. Fluids/Electrolytes/Nutrition: Routine I&O's with follow-up chemistries,severe swallowing difficulties, meal intake is 75%  8. Seizure prophylaxis. Keppra now at 1000 mg twice a day---more alert. EEG displayed seizure activity 9. Atrial fibrillation. Cardiac rate control. Continue amiodarone 200 mg daily, Cardizem 60 mg twice a day.Eliquis held secondary to [place], family does not want to resume, would be comfortable with Xarelto ,Will ask pharmacy to initiate low-dose tomorrow if hemoglobin remains stable Filed Vitals:   07/06/15 1454 07/07/15 0500  BP: 117/64 135/58  Pulse: 99 87  Temp: 97.7 F (36.5 C) 97.8 F (36.6 C)  Resp: 18 19   10. 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 This  is likely the etiology of the persistent cough, Right lung nodules,As noted plan follow-up MRI after ICH absorbed for further evaluation of possible metastasis 11. Diabetes mellitus with peripheral neuropathy. Hemoglobin A1c 6.8. Glucophage 1000 mg at breakfast and 500 mg supper , Generally well controlled on the current regimen Amaryl 1 mg daily. Check blood sugars before meals and at bedtime CBG (last 3) generally controlled  Recent Labs  07/06/15 1635 07/06/15 2100 07/07/15 0647  GLUCAP 116* 117* 136*    12. Hyperlipidemia. Lipitor 13. UTI enterobacter cloacae- Sensitive to ceftriaxone. We'll start 1 g every 24 hours.  Afebrile , 7 day course    We'll complete on Monday           14. Anemia stool guaiac negative X 2, thus far looks like normocytic anemia of chronic disease exacerbated by frequent blood draws while on IV heparin.started pt on Iron supplement .  As per internal medicine now on Xarelto. This was also Recommended by neurology.hemoglobin stable at 8.9.    LOS (Days) Saline  Allona Gondek E 07/07/2015, 9:26 AM

## 2015-07-07 NOTE — Plan of Care (Signed)
Problem: RH Balance Goal: LTG: Patient will maintain dynamic sitting balance (OT) LTG: Patient will maintain dynamic sitting balance with assistance during activities of daily living (OT)  LTG downgraded from S to min A due to limited progress.  Goal: LTG Patient will maintain dynamic standing with ADLs (OT) LTG: Patient will maintain dynamic standing balance with assist during activities of daily living (OT)  LTG downgraded from min A to max A  due to limited progress.  Problem: RH Bathing Goal: LTG Patient will bathe with assist, cues/equipment (OT) LTG: Patient will bathe specified number of body parts with assist with/without cues using equipment (position) (OT)  LTG downgraded from min A to max A due to limited progress.  Problem: RH Dressing Goal: LTG Patient will perform upper body dressing (OT) LTG Patient will perform upper body dressing with assist, with/without cues (OT).  LTG downgraded from min A to mod A due to limited progress. Goal: LTG Patient will perform lower body dressing w/assist (OT) LTG: Patient will perform lower body dressing with assist, with/without cues in positioning using equipment (OT)  LTG downgraded from min A to max A due to limited progress.  Problem: RH Toileting Goal: LTG Patient will perform toileting w/assist, cues/equip (OT) LTG: Patient will perform toiletiing (clothes management/hygiene) with assist, with/without cues using equipment (OT)  LTG discontinued due to limited progress.  Problem: RH Functional Use of Upper Extremity Goal: LTG Patient will use RT/LT upper extremity as a (OT) LTG: Patient will use right/left upper extremity as a stabilizer/gross assist/diminished/nondominant/dominant level with assist, with/without cues during functional activity (OT)  LTG downgraded from S to mod A due to limited progress.  Problem: RH Toilet Transfers Goal: LTG Patient will perform toilet transfers w/assist (OT) LTG: Patient will perform toilet  transfers with assist, with/without cues using equipment (OT)  LTG discontinued due to limited progress.  Problem: RH Tub/Shower Transfers Goal: LTG Patient will perform tub/shower transfers w/assist (OT) LTG: Patient will perform tub/shower transfers with assist, with/without cues using equipment (OT)  LTG discontinued due to limited progress.

## 2015-07-07 NOTE — Progress Notes (Signed)
Speech Language Pathology Daily Session Note  Patient Details  Name: Ivan Beasley MRN: [id number] Date of Birth: [birth year]  Today's Date: 07/07/2015 SLP Individual Time: 4034-7425 SLP Individual Time Calculation (min): 42 min  Short Term Goals: Week 3: SLP Short Term Goal 1 (Week 3): Pt will be intelligible in phrases with min assist verbal cues for overarticulation, increased vocal intensity,  SLP Short Term Goal 2 (Week 3): Pt will consume Dys 1 textures and nectar thick liquids with supervision verbal cues for use of swallowing precautions and minimal overt s/s of aspiration.  SLP Short Term Goal 3 (Week 3): Pt will consume therapeutic trials of thin liquids via teaspoon with min assist verbal cues for use of swallowing precautions and minimal overt s/s of aspiration.  SLP Short Term Goal 4 (Week 3): Pt will return demonstration of pharyngeal strengthening exercises for 25 repetitions each with min assist verbal cues.   SLP Short Term Goal 5 (Week 3): Pt will sustain his attention to basic tasks for 5 minute intervals with min verbal cues for redirection.   SLP Short Term Goal 6 (Week 3): Pt will return demonstration of IMST and RMST devices with supervision verbal cues at 9 cm H2O and 15 cm H2O respectively with effort level rated <7/10 over three consecutive sessions.   Skilled Therapeutic Interventions:  Pt was seen for skilled ST targeting goals for communication and dysphagia.  Pt was asleep upon arrival but easily awakened to voice and light touch.  Once awakened, pt was noted to be much brighter in comparison to yesterday's therapy session.  SLP facilitated the session with a trial snack of dys 2 textures to continue working towards textures progression.  Pt demonstrated increased cough with advanced solids; no overt s/s of aspiration with nectar thick liquids.  Pt required mod assist multimodal cues to strip solids from spoon given decreased labial seal and mild residuals were left  in the oral cavity post swallow due to weakness.  Do not recommend advancement at this time.  Pt was noted with improved initiation of functional communication and participation in social exchanges with therapist throughout therapy session.  Max assist multimodal cues were needed for slow rate, increased vocal intensity, and overarticulation to achieve intelligibility at the phrase level.  Pt was left in bed with call bell within reach and wife at bedside.  Continue per current plan of care.    Function:  Eating Eating   Modified Consistency Diet: Yes Eating Assist Level: Help managing cup/glass;Supervision or verbal cues;Set up assist for   Eating Set Up Assist For: Opening containers       Cognition Comprehension Comprehension assist level: Understands basic 75 - 89% of the time/ requires cueing 10 - 24% of the time  Expression   Expression assist level: Expresses basic 50 - 74% of the time/requires cueing 25 - 49% of the time. Needs to repeat parts of sentences.  Social Interaction Social Interaction assist level: Interacts appropriately 50 - 74% of the time - May be physically or verbally inappropriate.  Problem Solving Problem solving assist level: Solves basic 50 - 74% of the time/requires cueing 25 - 49% of the time  Memory Memory assist level: Recognizes or recalls 50 - 74% of the time/requires cueing 25 - 49% of the time    Pain Pain Assessment Pain Assessment: No/denies pain  Therapy/Group: Individual Therapy  Brenen Beigel, [name] 07/07/2015, 4:28 PM

## 2015-07-07 NOTE — Consult Note (Signed)
Neuropsychology Psychosocial - Confidential [place] [facility] _____________________________________________________________________________   [name] is a 75 year old man, who was seen for follow-up today to continue to provide assistance with adjustment to illness in the setting of intracranial hemorrhage.  He previously underwent a psychodiagnostic evaluation with this provider, which revealed the presence of marked anxiety as well as possible mild clinical depression.  That evaluation also indicated the presence of marked cognitive disruption, at the level of dementia.    During today's session, [name] wife was present most of the time.  He reported feeling a little down sometimes, but generally less worried than he was previously.  Of note, his responses to self-report measures of mood symptoms are consistent with his self-assessment, as he endorsed only a borderline clinical level of anxiety today, but continued mild depressive symptoms.  [name] said that he stays tired most of the time and is not sleeping well at night.  He also said that his appetite is poor.  [name] endorsed significant motivation to participate in various therapies offered, as he wants to work toward recovery.   Significant time was spent during today's session processing [name] fears about death.  Afterward, he clarified that he feels better when his wife is around.  Therefore, we switched gears to work toward determining the best ways for him to feel good when his wife cannot be around.  He was agreeable to reading a [employer] and he requested that his wife bring pictures of the two of them to help lift his spirits when she cannot be there in person.  Continued follow-up with the neuropsychologist prior to discharge could be requested, as indicated.    DIAGNOSES: Intracranial Hemorrhage Adjustment disorder with mixed anxiety and depressed mood  Greater than  50% of this visit was spent educating the patient about the possible diagnosis, prognosis, management plan, and in coordination of care.   Ivan Hatcher, PsyD Clinical Neuropsychologist

## 2015-07-08 ENCOUNTER — Inpatient Hospital Stay (HOSPITAL_COMMUNITY): Payer: Medicare HMO | Admitting: Occupational Therapy

## 2015-07-08 ENCOUNTER — Inpatient Hospital Stay (HOSPITAL_COMMUNITY): Payer: Medicare HMO | Admitting: Speech Pathology

## 2015-07-08 ENCOUNTER — Inpatient Hospital Stay (HOSPITAL_COMMUNITY): Payer: Medicare HMO | Admitting: Physical Therapy

## 2015-07-08 ENCOUNTER — Inpatient Hospital Stay (HOSPITAL_COMMUNITY): Payer: Medicare HMO

## 2015-07-08 DIAGNOSIS — R410 Disorientation, unspecified: Secondary | ICD-10-CM | POA: Insufficient documentation

## 2015-07-08 DIAGNOSIS — E1142 Type 2 diabetes mellitus with diabetic polyneuropathy: Secondary | ICD-10-CM | POA: Insufficient documentation

## 2015-07-08 LAB — GLUCOSE, CAPILLARY
GLUCOSE-CAPILLARY: 174 mg/dL — AB (ref 65–99)
GLUCOSE-CAPILLARY: 80 mg/dL (ref 65–99)
GLUCOSE-CAPILLARY: 77 mg/dL (ref 65–99)
Glucose-Capillary: 112 mg/dL — ABNORMAL HIGH (ref 65–99)

## 2015-07-08 LAB — HEMOGLOBIN AND HEMATOCRIT, BLOOD
HEMATOCRIT: 28.7 % — AB (ref 39.0–52.0)
Hemoglobin: 8.9 g/dL — ABNORMAL LOW (ref 13.0–17.0)

## 2015-07-08 MED ORDER — DICLOFENAC SODIUM 1 % TD GEL
2.0000 g | Freq: Four times a day (QID) | TRANSDERMAL | Status: DC
  Administered 2015-07-08 – 2015-07-13 (×19): 2 g via TOPICAL
  Filled 2015-07-08: qty 100

## 2015-07-08 NOTE — Progress Notes (Signed)
Physical Therapy Session Note  Patient Details  Name: Ivan Beasley MRN: [id number] Date of Birth: Jul 08, 1940  Today's Date: 07/08/2015 PT Individual Time: 0900-1000 PT Individual Time Calculation (min): 60 min   Short Term Goals: Week 3:  PT Short Term Goal 1 (Week 3): =LTG due to estimated length of stay  Skilled Therapeutic Interventions/Progress Updates:    Pt received supine in bed, denies pain and agreeable to treatment. Does c/o L knee pain during bridging/bed mobility but does not rate and improved when resting. Discussed with wife potential plan to d/c to SNF; explained that pt is progressing slowly but not at a rate that the team feels he would be ready to go home with only wife providing assist in the next week. Wife adamant that she wants to try hands-on training, reports she has been at almost every session and has been observing and beginning to assist with changing brief, feeding.   Pants donned maxA with wife's assistance supine in bed, unable to bridge enough to pull pants over hips and requires assist to roll to L to pull pants up. Supine>sit with L logroll and modA with cues for hand placement and sequencing. Transfer to L side, lateral scoot with transfer board with therapist demonstrating first two scoots; and allowing wife to perform remaining scoots to w/c. Transfer w/c >bed with wife providing maxA and therapist providing mod verbal/tactile cues for sequencing, body mechanics. Sit >supine with wife providing modA to lift BLEs into bed; pt leaned backwards after coming down onto elbow, nearly hitting head on bedrail. Discussed importance of coming down onto L shoulder before lifting BLEs into bed for safety. Wife again performed supine>sit and transfer >w/c with maxA and reduced cues from therapist for [place] and setup. Wife reports no discomfort/back pain etc during transfer. Discussed need to continue practicing in different scenarios, varying levels of pt fatigue to  improve independence. Pt remained seated in w/c at completion of session, all needs within reach.   Therapy Documentation Precautions:  Precautions Precautions: Fall Precaution Comments: R hemi, pt on home O2 PTA for recent pneumonia (but used it PRN, not 24/7) Restrictions Weight Bearing Restrictions: No Pain: Pain Assessment Pain Assessment: No/denies pain   See Function Navigator for Current Functional Status.   Therapy/Group: Individual Therapy  Luberta Mutter 07/08/2015, 10:08 AM

## 2015-07-08 NOTE — Progress Notes (Signed)
Occupational Therapy Session Note  Patient Details  Name: Ivan Beasley MRN: [id number] Date of Birth: [birth year]  Today's Date: 07/08/2015 OT Individual Time: 1100-1200 OT Individual Time Calculation (min): 60 min    Short Term Goals: Week 1:  OT Short Term Goal 1 (Week 1): Pt will demonstrate increased awareness to RUE by positioning appropriately to decrease shoulder subluxation OT Short Term Goal 1 - Progress (Week 1): Not met OT Short Term Goal 2 (Week 1): Pt will completed squat pivot transfer to toilet with max assist of one caregiver OT Short Term Goal 2 - Progress (Week 1): Not met OT Short Term Goal 3 (Week 1): Pt will complete UB dressing with mod assist OT Short Term Goal 3 - Progress (Week 1): Not met OT Short Term Goal 4 (Week 1): Pt will complete LB dressing with max assist of one caregiver at sit > stand level OT Short Term Goal 4 - Progress (Week 1): Not met OT Short Term Goal 5 (Week 1): Pt will complete bathing with mod assist at sit > stand level OT Short Term Goal 5 - Progress (Week 1): Not met Week 2:  OT Short Term Goal 1 (Week 2): Pt will demonstrate increased awareness to RUE by positioning appropriately to decrease shoulder subluxation OT Short Term Goal 1 - Progress (Week 2): Progressing toward goal OT Short Term Goal 2 (Week 2): Pt will complete squat pivot transfer to toilet with max assist of one caregiver OT Short Term Goal 2 - Progress (Week 2): Not progressing OT Short Term Goal 3 (Week 2): Pt will complete UB dressing with mod assist in unsupported sitting OT Short Term Goal 3 - Progress (Week 2): Progressing toward goal OT Short Term Goal 4 (Week 2): Pt will complete LB dressing with max assist of one caregiver at bed level OT Short Term Goal 4 - Progress (Week 2): Progressing toward goal OT Short Term Goal 5 (Week 2): Pt will complete bathing with mod assist at bed level OT Short Term Goal 5 - Progress (Week 2): Progressing toward goal Week 3:  OT  Short Term Goal 1 (Week 3): STGs = LTGs based on modified LTGs  Skilled Therapeutic Interventions/Progress Updates:    Pt seen for skilled OT to facilitate trunk control, balance, RUE NMR. At start of session, pt was bathed and dressed and in chair. Wife expressed her desire to practice sliding board transfers with the hopes she may be able to take him home versus SNF.  Discussed logistics/ adaptive strategies to use at home for self care.  Due to incontinence, they will not try BSC transfers at this time. They will continue to use the briefs and urinals.  To prepare for the potential of him using one in the future, spouse was provided with photos of padded open seat BSC with drop arms and a padded open seat tub bench as these work well with a sliding board.  Discussed sponge baths in bed only.  When pt is ready for a BSC, discussed strategy of doffing pants in bed, using a bed pad on slide board to [facility], then transferring back to bed to don pants to avoid challenge of clothing management from [facility]. Both, pt and spouse in agreement with those strategies.  Pt in w/c feeling very fatigued, used board to transfer to bed but wife did need to assist therapist as pt leaning heavily to R due to fatigue. Once on bed, pt worked on sitting balance, head/trunk extension, RUE wt  bearing for 40 min.  He is now able to sit statically EOB without UE A for at least 5 min. Worked on dynamic reaching to right and head lifting. Pt participated well. At end of session, moved into supine and attempted to elicit tricep reflex with hand drop exercise but no reflex or tone felt. Pt in bed with all needs met.  Therapy Documentation Precautions:  Precautions Precautions: Fall Precaution Comments: R hemi, pt on home O2 PTA for recent pneumonia (but used it PRN, not 24/7) Restrictions Weight Bearing Restrictions: No   Pain: Pain Assessment Pain Assessment: No/denies pain ADL:  See Function Navigator for Current Functional  Status.   Therapy/Group: Individual Therapy  SAGUIER,JULIA 07/08/2015, 12:33 PM

## 2015-07-08 NOTE — Progress Notes (Signed)
[facility] PROGRESS NOTE  Ivan Beasley:[id number] DOB: 17-Dec-1940 DOA: 06/27/2015   PCP: Ivan Billet, MD     Assessment/Plan: Principal Problem:   ICH (intracerebral hemorrhage) ([place])- L frontal ICH Active Problems:   Mild chronic obstructive pulmonary disease (HCC)   Seizures ([place]), possible   CKD (chronic kidney disease), stage III   Anemia   Right hemiparesis ([place])   Cerebrovascular accident (CVA) due to embolism of left middle cerebral artery (HCC)   Chronic anticoagulation   Malignant neoplasm of left lung (HCC)   Adjustment disorder with mixed anxiety and depressed mood   Ivan Beasley is a 75 y.o. 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 Consultation requested for anemia   Assessment and plan  1. Anemia - H/H remains stable. Patient is back to Xarelto. CT abdomen is negative for retroperitoneal bleed.   2. Recent Right hemiplegia secondary to left MCA infarct adjacent to left frontal ICH with adjacent subarachnoid hemorrhage while on Eliquis for Atrial fib.  - Due to near resolution of ICH as well as has been >2 weeks, and high risk for recurrent embolic stroke off anticoagulation and relative small size of infarct, neurology Dr. Erlinda Beasley  recommended on 6/20, to consider transition back to [place], wife okay with Xarelto, this can be started , continue to monitor hemoglobin  MRI with and without contrast 06/26/15 can not confirm or rule out metastasis at this time. As per neurology Still need to  repeat MRI with and without contrast after blood all absorbed.  3. Atrial fib. - sounds PAF., Currently normal sinus rhythm EKG  shows normal sinus rhythm  CHADsVASc score at least 6, very high risk for recurrent strokes, will need to weigh risk/benefits of resuming anticoagulation after r/o bleeding risk. Patient is back to Xarelto. 4. htn - stable. Optimize.  5. Dm 2 w/ neuropathy - Optimize. 6. Cad, sp cabg - in light of cardiac hx, will trx for Hb >8 at this time 7. 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 UTI enterobacter cloacae- Sensitive to ceftriaxone. Completed 7 day course of antibiotics. Repeat UA, urine culture and CXR (brief episode of confusion reported last night) 9.  Chronic nonproductive cough-last chest x-ray was on 6/23 with focal increased density in the right lung base, will repeat chest x-ray, doubt pneumonia, continue incentive spirometry, aspiration precautions    DVT prophylaxsis : On Xarelto  Code Status:  Full code       Family Communication: Discussed in detail with the [name], all imaging results, lab results explained to the patient   Disposition Plan: As per Ivan Blake, MD     Consultants:  Ivan Beasley  Procedures:   None  Antibiotics: Anti-infectives    Start     Dose/Rate Route Frequency Ordered Stop   06/28/15 1000  cefTRIAXone (ROCEPHIN) 1 g in dextrose 5 % 50 mL IVPB  Status:  Discontinued     1 g 100 mL/hr over 30 Minutes Intravenous Every 24 hours 06/28/15 0847 07/05/15 0834   06/27/15 2100  vancomycin (VANCOCIN) 1,500 mg in sodium chloride 0.9 %  500 mL IVPB  Status:  Discontinued     1,500 mg 250 mL/hr over 120 Minutes Intravenous Every 24 hours 06/26/15 2018 06/28/15 0843      HPI/Subjective: Continues to have a nonproductive cough, otherwise stable, afebrile  Objective: Filed Vitals:   07/07/15 0500 07/07/15 0716 07/07/15 1418 07/08/15 0540   BP: 135/58  117/53 123/62  Pulse: 87  98 100  Temp: 97.8 F (36.6 C)  98.4 F (36.9 C) 97.6 F (36.4 C)  TempSrc: Oral  Oral Oral  Resp: '19  18 16  '$ Weight:  92.126 kg (203 lb 1.6 oz)    SpO2: 99%  94% 92%    Intake/Output Summary (Last 24 hours) at 07/08/15 0753 Last data filed at 07/07/15 1829  Gross per 24 hour  Intake    360 ml  Output      0 ml  Net    360 ml    Exam:  Examination:  General exam: Appears calm and comfortable  Respiratory system: Clear to auscultation. Respiratory effort normal. Cardiovascular system: S1 & S2 heard, RRR. No JVD, murmurs, rubs, gallops or clicks. No pedal edema. Gastrointestinal system: Abdomen is nondistended, soft and nontender. No organomegaly or masses felt. Normal bowel sounds heard. Central nervous system: Alert and oriented. Right upper extremity weakness Extremities: Symmetric 5 x 5 power. Skin: No rashes, lesions or ulcers Psychiatry: Judgement and insight appear normal. Mood & affect appropriate.     Data Reviewed: I have personally reviewed following labs and imaging studies  Micro Results No results found for this or any previous visit (from the past 240 hour(s)).  Radiology Reports Ct Abdomen Pelvis Wo Contrast  07/04/2015  CLINICAL DATA:  Anemia, history coronary disease post MI, diabetes mellitus, former smoker, stage III chronic kidney disease, LEFT lung cancer, atrial fibrillation EXAM: CT ABDOMEN AND PELVIS WITHOUT CONTRAST TECHNIQUE: Multidetector CT imaging of the abdomen and pelvis was performed following the standard protocol without IV contrast. Sagittal and coronal MPR images reconstructed from axial data set. No oral contrast was administered. COMPARISON:  None. CT from PET-CT exam 05/03/2015 FINDINGS: Lower chest:  Bibasilar atelectasis. Hepatobiliary: Cholelithiasis. Beam hardening artifacts from patient's arms traverse liver, no gross hepatic abnormality seen. Pancreas: Atrophic, otherwise unremarkable Spleen:  Traversing artifacts, grossly normal appearance Adrenals/Urinary Tract: Normal appearing adrenal glands. Cyst at inferior pole LEFT kidney again identified 3.4 x 3.6 cm image 49. Kidneys otherwise unremarkable without additional mass or hydronephrosis. Single nondilated ureters bilaterally. Normal appearing well distended urinary bladder. Stomach/Bowel: Normal appendix. Retained dense contrast within colon from recent swallowing study. Stomach and bowel loops otherwise normal appearance for technique. Vascular/Lymphatic: Atherosclerotic calcifications aorta, iliac, and femoral arteries. Few coronary arterial calcifications. Low-attenuation of circulating blood consistent with history of anemia. No adenopathy. Reproductive: N/A Other: Small BILATERAL inguinal hernias containing fat. No free air or free fluid. Musculoskeletal: Degenerative disc disease changes L4-L5. No acute osseous findings. IMPRESSION: LEFT renal cyst. Cholelithiasis. Diffuse colonic diverticulosis without gross evidence acute diverticulitis. Small BILATERAL inguinal hernias containing fat. Aortic atherosclerosis. Electronically Signed   By: Lavonia Dana M.D.   On: 07/04/2015 12:55   Dg Chest 2 View  07/02/2015  CLINICAL DATA:  Persistent cough, COPD, lung malignancy. Patient cyst has sustained an intracranial hemorrhage with right hemi [place] is EXAM: CHEST  2 VIEW COMPARISON:  Chest x-ray of June 26, 2015 FINDINGS: The right lung is adequately inflated and exhibits increased density in the infrahilar region. There is some overlap here of the Port-A-Cath reservoir. On  the left there is persistent increased density in the perihilar region extending toward the lateral pleural surface. The heart is top-normal in size. There are post CABG changes. Three upper sternal wires are chronically broken. There is calcification in the wall of the aortic arch. There is no pleural effusion or pneumothorax. The Port-A-Cath appliance tip projects over the  midportion of the SVC. IMPRESSION: 1. Subtle increased density at the right lung base suggests subsegmental atelectasis. Stable parenchymal density in the left mid lung and left perihilar region. 2. Stable post CABG changes.  No evidence of CHF. Electronically Signed   By: David  Martinique M.D.   On: 07/02/2015 09:40   Dg Chest 2 View  06/26/2015  CLINICAL DATA:  C/o chronic cough. 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 Rales heard in right lower lung. 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 EXAM: CHEST  2 VIEW COMPARISON:  06/25/2015 FINDINGS: There stable changes from prior CABG surgery. Cardiac silhouette is mildly enlarged. Focal irregular opacity in the posterior perihilar region of the left lung is stable. There are no new areas of lung opacification. No pleural effusion or pneumothorax. Right anterior chest wall Port-A-Cath is stable. Bony thorax is intact. IMPRESSION: 1. No acute cardiopulmonary disease. 2. No change from the prior study. Persistent focal opacity in the perihilar region of the left lung consistent with the reported squamous cell lung carcinoma. Electronically Signed   By: Lajean Manes M.D.   On: 06/26/2015 16:36   Dg Chest 2 View  06/25/2015  CLINICAL DATA:  Cough, left-sided weakness EXAM: CHEST  2 VIEW COMPARISON:  07/08/2015 FINDINGS: Cardiomediastinal silhouette is stable. Right IJ Port-A-Cath is unchanged in position. Persistent left upper lobe consolidation. Bony thorax is unremarkable. IMPRESSION: No significant change. Persistent left upper lobe consolidation. Stable right IJ Port-A-Cath position. Electronically Signed   By: Lahoma Crocker M.D.   On: 06/25/2015 16:54   Ct Head Wo Contrast  06/26/2015  CLINICAL DATA:  Slurred speech. EXAM: CT HEAD WITHOUT CONTRAST TECHNIQUE: Contiguous axial images were obtained from the base of the skull through the vertex without intravenous contrast. COMPARISON:  MRI of June 14, 2015.  CT scan of  June 13, 2015. FINDINGS: Bony calvarium appears intact.  Sphenoid sinusitis is noted. Mild diffuse cortical atrophy is noted. There is continued presence of left superior parietal intraparenchymal hemorrhage which now measures 21 x 11 mm and is slightly decreased in size compared to prior exam. Subarachnoid hemorrhage has resolved. There is continued surrounding vasogenic edema. No significant midline shift is noted. Ventricular size is unremarkable. IMPRESSION: Mild diffuse cortical atrophy. Continued presence of left parietal intraparenchymal hemorrhage with surrounding vasogenic edema. This is improved compared to prior exam. Subarachnoid hemorrhage is no longer present. Electronically Signed   By: Marijo Conception, M.D.   On: 06/26/2015 14:29   Ct Head Wo Contrast  06/13/2015  CLINICAL DATA:  The woke at 5 a.m. with right-sided weakness. EXAM: CT HEAD WITHOUT CONTRAST TECHNIQUE: Contiguous axial images were obtained from the base of the skull through the vertex without intravenous contrast. COMPARISON:  None. FINDINGS: There is intra cerebral hemorrhage noted in the posterior left frontal lobe. Subarachnoid extension. Hemorrhage measures 2.4 x 2.2 x 1.6 cm for a volume of 4.2 mL. Mild surrounding vasogenic edema. No significant mass effect. No midline shift. No hydrocephalus. No acute calvarial abnormality. Air-fluid level in the left sphenoid sinus. Mastoid air cells are clear. IMPRESSION: Left posterior frontal intra cerebral  hemorrhage with subarachnoid extension as described above. Critical Value/emergent results were called by telephone at the time of interpretation on 06/13/2015 at 10:02 am to Dr. Loura Pardon , who verbally acknowledged these results. Electronically Signed   By: Rolm Baptise M.D.   On: 06/13/2015 10:04   Mr Jodene Nam Head Wo Contrast  06/15/2015  ADDENDUM REPORT: 06/15/2015 08:11 ADDENDUM: With the rounded contour of the enhancing component of the left frontal lobe hematoma, follow-up until  clearance recommended to exclude underlying vascular abnormality. Electronically Signed   By: Genia Del M.D.   On: 06/15/2015 08:11  06/15/2015  CLINICAL DATA:  75 year old diabetic male with history of lung cancer. Right-sided weakness. Subsequent encounter. EXAM: MRI HEAD WITHOUT CONTRAST MRA HEAD WITHOUT CONTRAST TECHNIQUE: Multiplanar, multiecho pulse sequences of the brain and surrounding structures were obtained without intravenous contrast. Angiographic images of the head were obtained using MRA technique without contrast. COMPARISON:  06/13/2015 head CT.  07/28/2014 brain MR. FINDINGS: MRI HEAD FINDINGS Posterior left frontal lobe complex 2.5 x 2.2 x 2 cm hemorrhagic lesion with marked surrounding vasogenic edema with breakthrough into sulci with subarachnoid hemorrhage noted. Patient's history of lung cancer in addition to 6 mm rounded area of enhanced along the posterior margin of this hemorrhage suggests that this is most likely is related to a hemorrhagic metastatic lesion which has bled. Continued MR surveillance as hemorrhage clears is recommended. There may be a a second enhancing lesion within the anterior left parietal lobe with surrounding vasogenic edema. Venous infarct can have a similar appearance. The major dural sinuses appear patent. No acute thrombotic infarct separate from the above described findings. Remote small left parietal lobe, left frontal lobe, posterior left opercular and tiny right thalamic infarct. Mild small vessel disease changes. Moderate global atrophy without hydrocephalus. Opacification left sphenoid sinus with air-fluid level suggesting acute sinusitis. Mild mucosal thickening ethmoid sinus air cells and minimal mucosal thickening frontal sinuses. Bilateral mastoid air cell and middle ear opacification greater on the left without obstructing lesion of the eustachian tube noted. Post lens replacement without acute orbital abnormality noted. MRA HEAD FINDINGS MR  angiogram does not incorporate the left frontal lobe hemorrhage. Mild narrowing supraclinoid aspect internal carotid artery bilaterally with irregularity more notable on the left. Small infundibulum on the left posterior communicating artery level. Anterior circulation without medium or large size vessel significant stenosis or occlusion. Middle cerebral artery branch vessel irregularity bilaterally. Right vertebral artery ends in a posterior inferior cerebellar artery distribution. No significant narrowing left vertebral artery. Moderate narrowing portions of the left posterior inferior cerebellar artery. Ectatic basilar artery without high-grade stenosis. Nonvisualized anterior inferior cerebellar arteries. Small left superior cerebellar artery. Posterior cerebral artery distal branch vessel narrowing bilaterally. IMPRESSION: MRI HEAD Posterior left frontal lobe complex 2.5 x 2.2 x 2 cm hemorrhagic lesion with marked surrounding vasogenic edema with breakthrough into sulci with subarachnoid hemorrhage noted. Patient's history of lung cancer in addition to 6 mm rounded area of enhanced along the posterior margin of this hemorrhage suggests that this is most likely is related to a hemorrhagic metastatic lesion which has bled. Continued MR surveillance as hemorrhage clears is recommended. There may be a a second enhancing lesion within the anterior left parietal lobe with surrounding vasogenic edema. Venous infarct can have a similar appearance. The major dural sinuses appear patent. Opacification left sphenoid sinus with air-fluid level suggesting acute sinusitis. Bilateral mastoid air cell and middle ear opacification greater on the left without obstructing lesion of the eustachian tube  noted. MRA HEAD MR angiogram does not incorporate the left frontal lobe hemorrhage. Mild narrowing supraclinoid aspect internal carotid artery bilaterally with irregularity more notable on the left. Small infundibulum on the left  posterior communicating artery level. Anterior circulation without medium or large size vessel significant stenosis or occlusion. Middle cerebral artery branch vessel irregularity bilaterally. Right vertebral artery ends in a posterior inferior cerebellar artery distribution. No significant narrowing left vertebral artery. Moderate narrowing portions of the left posterior inferior cerebellar artery. Ectatic basilar artery without high-grade stenosis. Nonvisualized anterior inferior cerebellar arteries. Small left superior cerebellar artery. Posterior cerebral artery distal branch vessel narrowing bilaterally. Electronically Signed: By: Genia Del M.D. On: 06/14/2015 19:34   [name] DU Contrast  06/26/2015  CLINICAL DATA:  Initial evaluation for progressively garbled and slurred speech, with increased right-sided facial droop. EXAM: MRI HEAD WITHOUT AND WITH CONTRAST TECHNIQUE: Multiplanar, multiecho pulse sequences of the brain and surrounding structures were obtained without and with intravenous contrast. CONTRAST:  48m MULTIHANCE GADOBENATE DIMEGLUMINE 529 MG/ML IV SOLN COMPARISON:  Prior CT from earlier the same day as well as previous MRI from 06/14/2015. FINDINGS: Generalized cerebral atrophy with minimal chronic small vessel ischemic changes again noted, stable. Small remote lacunar infarct within the right thalamus. Previously identified posterior left frontal lobe complex hemorrhagic lesion again seen, overall likely decreased in size from previous. Lesion measures 13 x 21 x 16 mm on T2 weighted sequences. Surrounding vasogenic edema is similar. Breakthrough subarachnoid hemorrhage within the adjacent cortical sulci again seen, improved. There is increased peripheral enhancement about the margin of the hemorrhagic lesion, likely related to the evolving hematoma. Previously noted small hemorrhagic nodule is not as clearly delineated on this exam. Note again made of a second possible enhancing lesion  within the anterior left parietal lobe along the dura, measuring approximately 9 x 11 x 11 mm. Surrounding vasogenic edema. No other new enhancing lesions. Immediately lateral and inferior to the dominant hemorrhagic lesion. There is a focus of abnormal restricted diffusion involving the cortical gray matter and underlying white matter of the posterior left frontal region, compatible with acute ischemic infarct (series 4, image 35). Associated signal loss on ADC map. This appears to be more vascular distribution rather than seizure related. Additional small sub cm focus of restricted diffusion more posteriorly within the left centrum semi ovale. No significant mass effect. Area of infarction closely approximates the hemorrhagic lesion can subarachnoid hemorrhage, but does not appear to be hemorrhagic in of itself. No other acute infarct. Gray-white matter differentiation otherwise maintained. Major intracranial vascular flow voids are preserved. No midline shift. No hydrocephalus. No extra-axial fluid collection. Again, major dural sinuses appear to be grossly patent. Craniocervical junction within normal limits. Visualized upper cervical spine unremarkable. Pituitary gland within normal limits. No acute abnormality about the globes and orbits. Patient is status post lens extraction bilaterally. Mucosal thickening with fluid level within the left sphenoid sinus, similar to prior. Mild scattered opacity within the anterior ethmoidal air cells. Bilateral mastoid effusions present. Visualize bone marrow within normal limits. Scalp soft tissues demonstrate no acute abnormality. IMPRESSION: 1. Acute ischemic left MCA territory infarct adjacent to the posterior left frontal hemorrhagic lesion. Vasospasm as an underlying etiology is suspected given the adjacent complex hemorrhagic lesion and subarachnoid hemorrhage. 2. Continued interval evolution of complex left frontal lobe hemorrhagic lesion with similar vasogenic  edema. Associated breakthrough subarachnoid hemorrhage is improved. This lesion now demonstrates peripheral enhancement, which would be expected with an evolving hematoma. Previously  noted small nodular focus of enhancement not well delineated on this exam. Again, continued MR surveillance at the hemorrhage clears is recommended to ensure no underlying lesion is present. 3. Additional 9 x 11 x 11 mm enhancing lesion within the anterior left parietal lobe, similar to prior. Attention at follow-up recommended. 4. Acute left sphenoid sinus disease with bilateral mastoid effusions, similar to prior. Electronically Signed   By: Jeannine Boga M.D.   On: 06/26/2015 23:30   [name] BH Contrast  06/15/2015  ADDENDUM REPORT: 06/15/2015 08:11 ADDENDUM: With the rounded contour of the enhancing component of the left frontal lobe hematoma, follow-up until clearance recommended to exclude underlying vascular abnormality. Electronically Signed   By: Genia Del M.D.   On: 06/15/2015 08:11  06/15/2015  CLINICAL DATA:  75 year old diabetic male with history of lung cancer. Right-sided weakness. Subsequent encounter. EXAM: MRI HEAD WITHOUT CONTRAST MRA HEAD WITHOUT CONTRAST TECHNIQUE: Multiplanar, multiecho pulse sequences of the brain and surrounding structures were obtained without intravenous contrast. Angiographic images of the head were obtained using MRA technique without contrast. COMPARISON:  06/13/2015 head CT.  07/28/2014 brain MR. FINDINGS: MRI HEAD FINDINGS Posterior left frontal lobe complex 2.5 x 2.2 x 2 cm hemorrhagic lesion with marked surrounding vasogenic edema with breakthrough into sulci with subarachnoid hemorrhage noted. Patient's history of lung cancer in addition to 6 mm rounded area of enhanced along the posterior margin of this hemorrhage suggests that this is most likely is related to a hemorrhagic metastatic lesion which has bled. Continued MR surveillance as hemorrhage clears is recommended.  There may be a a second enhancing lesion within the anterior left parietal lobe with surrounding vasogenic edema. Venous infarct can have a similar appearance. The major dural sinuses appear patent. No acute thrombotic infarct separate from the above described findings. Remote small left parietal lobe, left frontal lobe, posterior left opercular and tiny right thalamic infarct. Mild small vessel disease changes. Moderate global atrophy without hydrocephalus. Opacification left sphenoid sinus with air-fluid level suggesting acute sinusitis. Mild mucosal thickening ethmoid sinus air cells and minimal mucosal thickening frontal sinuses. Bilateral mastoid air cell and middle ear opacification greater on the left without obstructing lesion of the eustachian tube noted. Post lens replacement without acute orbital abnormality noted. MRA HEAD FINDINGS MR angiogram does not incorporate the left frontal lobe hemorrhage. Mild narrowing supraclinoid aspect internal carotid artery bilaterally with irregularity more notable on the left. Small infundibulum on the left posterior communicating artery level. Anterior circulation without medium or large size vessel significant stenosis or occlusion. Middle cerebral artery branch vessel irregularity bilaterally. Right vertebral artery ends in a posterior inferior cerebellar artery distribution. No significant narrowing left vertebral artery. Moderate narrowing portions of the left posterior inferior cerebellar artery. Ectatic basilar artery without high-grade stenosis. Nonvisualized anterior inferior cerebellar arteries. Small left superior cerebellar artery. Posterior cerebral artery distal branch vessel narrowing bilaterally. IMPRESSION: MRI HEAD Posterior left frontal lobe complex 2.5 x 2.2 x 2 cm hemorrhagic lesion with marked surrounding vasogenic edema with breakthrough into sulci with subarachnoid hemorrhage noted. Patient's history of lung cancer in addition to 6 mm rounded  area of enhanced along the posterior margin of this hemorrhage suggests that this is most likely is related to a hemorrhagic metastatic lesion which has bled. Continued MR surveillance as hemorrhage clears is recommended. There may be a a second enhancing lesion within the anterior left parietal lobe with surrounding vasogenic edema. Venous infarct can have a similar appearance. The major dural sinuses  appear patent. Opacification left sphenoid sinus with air-fluid level suggesting acute sinusitis. Bilateral mastoid air cell and middle ear opacification greater on the left without obstructing lesion of the eustachian tube noted. MRA HEAD MR angiogram does not incorporate the left frontal lobe hemorrhage. Mild narrowing supraclinoid aspect internal carotid artery bilaterally with irregularity more notable on the left. Small infundibulum on the left posterior communicating artery level. Anterior circulation without medium or large size vessel significant stenosis or occlusion. Middle cerebral artery branch vessel irregularity bilaterally. Right vertebral artery ends in a posterior inferior cerebellar artery distribution. No significant narrowing left vertebral artery. Moderate narrowing portions of the left posterior inferior cerebellar artery. Ectatic basilar artery without high-grade stenosis. Nonvisualized anterior inferior cerebellar arteries. Small left superior cerebellar artery. Posterior cerebral artery distal branch vessel narrowing bilaterally. Electronically Signed: By: Genia Del M.D. On: 06/14/2015 19:34   Dg Chest Port 1 View  07/06/2015  CLINICAL DATA:  COUGH.HX MI,CA EXAM: PORTABLE CHEST 1 VIEW COMPARISON:  07/02/2015 FINDINGS: Heart size is enlarged. Right-sided Port-A-Cath tip to the level of the lower superior vena cava. There is patchy density in the left upper lobe, stable compared with prior study. Mild interstitial edema is noted. IMPRESSION: 1. Persistent left upper lobe density. 2. Mild  interstitial edema. Electronically Signed   By: Nolon Nations M.D.   On: 07/06/2015 14:09   Dg Chest Port 1 View  06/10/2015  CLINICAL DATA:  Cough and congestion. EXAM: PORTABLE CHEST 1 VIEW COMPARISON:  June 18, 2015 FINDINGS: There is persistent airspace consolidation in the left upper lobe with volume loss. Lungs elsewhere clear. Heart is upper normal in size with pulmonary vascularity within normal limits. Port-A-Cath tip is in the superior vena cava near the cavoatrial junction, stable. No pneumothorax. No adenopathy evident. Patient is status post coronary artery bypass grafting. IMPRESSION: Persistent left upper lobe airspace consolidation with volume loss. No change from 3 days prior. Stable cardiac silhouette. Electronically Signed   By: Lowella Grip III M.D.   On: 06/10/2015 07:25   Dg Chest Port 1 View  06/18/2015  CLINICAL DATA:  Cough EXAM: PORTABLE CHEST 1 VIEW COMPARISON:  06/16/2015 FINDINGS: Stable right jugular Port-A-Cath. Low volumes with bibasilar atelectasis. Left mid lung dense pulmonary opacity is stable. No pneumothorax. IMPRESSION: Stable left mid lung dense pulmonary opacity. Bibasilar atelectasis. Electronically Signed   By: Marybelle Killings M.D.   On: 06/18/2015 15:16   Dg Chest Port 1 View  06/16/2015  CLINICAL DATA:  75 year old male with lung cancer. No chest complaint at this time. EXAM: PORTABLE CHEST 1 VIEW COMPARISON:  Chest radiograph dated 06/15/2015 FINDINGS: There has been interval removal of the endotracheal and enteric tube. The stable area of density is again noted in the left mid lung field. Multiple surgical clips arms in this area. No new consolidation identified. Overall there is improved aeration of the left lower lung field since the prior study. The right lung is clear. There is no pleural effusion or pneumothorax. Stable cardiac silhouette. Median sternotomy wires and CABG vascular clips noted. Right pectoral Port-A-Cath with tip in stable positioning. No  acute osseous pathology. IMPRESSION: Interval removal of the endotracheal and enteric tube. Stable appearing density in the left mid lung field with overall improvement of the aeration of the left lung. Electronically Signed   By: Anner Crete M.D.   On: 06/16/2015 19:54   Dg Chest Port 1 View  06/15/2015  CLINICAL DATA:  Acute respiratory failure EXAM: PORTABLE CHEST 1 VIEW  COMPARISON:  Portable chest x-ray of 06/14/2015 FINDINGS: Aeration of the lungs has improved somewhat. The tip of the endotracheal tube appears to be approximately 3.8 cm above the carina. Opacity in the left perihilar and left mid lung is unchanged. Cardiomegaly is stable. Right sided Port-A-Cath is unchanged in position, and right IJ central venous line is unchanged. IMPRESSION: Slightly better aeration. Endotracheal tube tip 3.8 cm above the carina. Electronically Signed   By: Ivar Drape M.D.   On: 06/15/2015 08:05   Dg Chest Port 1 View  06/14/2015  CLINICAL DATA:  Respiratory failure, history of coronary artery disease, GERD, diabetes, lung cancer EXAM: PORTABLE CHEST 1 VIEW COMPARISON:  06/13/2015 FINDINGS: Cardiomediastinal silhouette is stable. Status post median sternotomy. Persistent opacity left hilum and left perihilar region. Post radiation fibrosis left upper lobe again noted. Left basilar atelectasis. Stable endotracheal and NG tube position. Right IJ Port-A-Cath is unchanged in position. Stable right IJ central line. No pneumothorax. No pulmonary edema. IMPRESSION: Stable support apparatus. Persistent opacity left hilum and left perihilar region. Postradiation fibrosis in left upper lobe again noted. Left basilar atelectasis. No pulmonary edema. No pneumothorax. Status post median sternotomy. Electronically Signed   By: Lahoma Crocker M.D.   On: 06/14/2015 08:15   Dg Chest Port 1 View  06/13/2015  CLINICAL DATA:  75 year old male with endotracheal tube placement. EXAM: PORTABLE CHEST 1 VIEW COMPARISON:  Radiograph dated  06/13/2015 FINDINGS: Endotracheal tube with tip approximately 4 cm above the carina. An enteric tube is partially visualized coursing the left hemi abdomen with tip beyond the inferior margin of the image. There is a right IJ central line with tip over the mediastinum. Right pectoral infusion catheter is also noted. There is persistent opacity in the left mid lung field. Multiple surgical clips in the left lower lung field similar to prior study. There is no pneumothorax. The right costophrenic angle has been excluded from the image. The cardiac border is silhouetted. No acute osseous pathology identified. IMPRESSION: Endotracheal tube above the carina. Stable appearing postsurgical changes and persistent opacity in the left mid lung field. Electronically Signed   By: Anner Crete M.D.   On: 06/13/2015 21:05   Dg Chest Port 1 View  06/13/2015  CLINICAL DATA:  Respiratory failure and cough. History of left lung squamous carcinoma. EXAM: PORTABLE CHEST 1 VIEW COMPARISON:  05/27/2015 and prior radiographs.  05/03/2015 PET CT FINDINGS: Cardiomegaly, CABG changes and right Port-A-Cath with tip overlying the lower SVC again noted. Left mid lung opacity is stable to slightly increased in size There is no evidence of pneumothorax or pleural effusion. No other changes identified. IMPRESSION: Stable to slightly increased left lung opacity which may represent posttreatment changes. No other significant change. Electronically Signed   By: Margarette Canada M.D.   On: 06/13/2015 18:10   Dg Abd Portable 1v  06/13/2015  CLINICAL DATA:  Orogastric tube placement EXAM: PORTABLE ABDOMEN - 1 VIEW COMPARISON:  PET-CT May 03, 2015 FINDINGS: Orogastric tube tip and side port are in the stomach. Bowel gas pattern is unremarkable. No bowel dilatation or air-fluid level suggesting obstruction. No free air. Moderate stool is noted in the colon. IMPRESSION: Orogastric tube tip and side port in stomach. Bowel gas pattern unremarkable. No  free air evident. Electronically Signed   By: Lowella Grip III M.D.   On: 06/13/2015 20:05   Dg Swallowing Func-speech Pathology  06/29/2015  Objective Swallowing Evaluation: Type of Study: MBS-Modified Barium Swallow Study Patient Details Name: Shonta Phillis  Fludd MRN: [id number] Date of Birth: [birth year] Today's Date: 06/29/2015 Time: SLP Start Time (ACUTE ONLY): 0910-SLP Stop Time (ACUTE ONLY): 0930 SLP Time Calculation (min) (ACUTE ONLY): 20 min Past Medical History: Past Medical History Diagnosis Date . Coronary artery disease  . Pneumonia 2000 . Arthritis  . 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 Bruises easily  . GERD (gastroesophageal reflux disease)    takes Omeprazole daily as needed for stomach pain . Blood transfusion 2001 . Diabetes mellitus    takes Metformin daily; . Impaired hearing    bil hearing aide . Macular degeneration    being watched for this but hasn't been "completely" diagnosed . Myocardial infarction ([place]) 2001 . Cancer [facility])  Past Surgical History: Past Surgical History Procedure Laterality Date . Coronary artery bypass graft  2001   4 vessels . Cardiac catheterization  2001 . Tonsillectomy     as a child . Colonoscopy   . Cataract extraction     bilateral . Lumbar laminectomy/decompression microdiscectomy  12/19/2010   Procedure: LUMBAR LAMINECTOMY/DECOMPRESSION MICRODISCECTOMY;  Surgeon: Elaina Hoops;  Location: [facility];  Service: Neurosurgery;  Laterality: N/A;  Lumbar three-four, four-five decompressive lumbar laminectomy . Joint replacement  right tkr . Eye surgery     cataracts . Back surgery     l4 5 . Portacath placement N/A 05/20/2014   Procedure: INSERTION PORT-A-CATH;  Surgeon: Nestor Lewandowsky, MD;  Location: [facility] ORS;  Service: General;  Laterality: N/A; HPI: 75 y.o. male patient with PMH: GERD, pna, CAD, DM, MI, cancer and chronic admitted with right-sided hemi-plegia. CT of the head revealed left frontal intracerebral hemorrhage, with subarachnoid hemorrhage as  well adjacent to it. Intubated approximately 24-30 hours (extubated 6/6). 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  Objective swallow study ordered to determine safest diet consistencies due to change in function.  No Data Recorded Assessment / Plan / Recommendation CHL IP CLINICAL IMPRESSIONS 06/29/2015 Therapy Diagnosis Moderate oral dysphagia, Moderate pharyngeal dysphagia  Clinical Impression Pt presents with a moderate oropharyngeal dysphagia with both sensory and motor components.  As mentioned on bedside swallow evaluation, pt presents with right sided labial, lingual, and buccal weakness which impacts his ability to contain and transit boluses effectively.  This in combination with decreased pharyngeal sensation result in premature spillage of materials into the pharynx with swallow response most consistently triggered at the pyriforms.  Delay in swallow response resulted in aspiration of thin liquids with delayed sensation.  Congested cough which occurred several seconds after aspiration event was ineffective for clearing aspirates from the airway.  Deep penetration was noted x1 over multiple boluses of nectar thick liquids which pt was able to sense and clear from the airway before materials passed below the vocal cords.  Poor base of tongue strength and decreased hyolaryngeal excursion also contributed to moderate pharyngeal residuals post swallow (vallecular residue > pyriforms).  Residue cleared to minimal-mild with cues for extra swallows. Recommend that pt initiate a Dys 1 diet with nectar thick liquids and full supervision for use of swallowing precautions.   Prognosis for advancement good with SLP intervention for pharyngeal strengthening exercises and management of safe diet progression.   Impact on safety and function Moderate aspiration risk     Prognosis 06/29/2015 Prognosis for Safe Diet Advancement Good Barriers to Reach Goals --  Barriers/Prognosis Comment -- CHL IP DIET RECOMMENDATION 06/29/2015 SLP Diet Recommendations Dysphagia 1 (Puree) solids;Nectar thick  liquid Liquid Administration via Cup Medication Administration Crushed with puree Compensations Slow rate;Small sips/bites;Clear throat intermittently;Multiple dry swallows after each bite/sip Postural Changes --   No flowsheet data found.         CHL IP ORAL PHASE 06/29/2015 Oral Phase Impaired Oral - Pudding Teaspoon -- Oral - Pudding Cup -- Oral - Honey Teaspoon -- Oral - Honey Cup -- Oral - Nectar Teaspoon Weak lingual manipulation;Right anterior bolus loss;Reduced posterior propulsion;Lingual/palatal residue;Delayed oral transit;Decreased bolus cohesion;Premature spillage Oral - Nectar Cup Right anterior bolus loss;Weak lingual manipulation;Lingual/palatal residue;Delayed oral transit;Decreased bolus cohesion;Premature spillage;Reduced posterior propulsion Oral - Nectar Straw Weak lingual manipulation;Lingual/palatal residue;Decreased bolus cohesion;Premature spillage Oral - Thin Teaspoon Right anterior bolus loss;Weak lingual manipulation;Reduced posterior propulsion;Lingual/palatal residue;Premature spillage;Delayed oral transit;Decreased bolus cohesion Oral - Thin Cup Weak lingual manipulation;Premature spillage;Lingual/palatal residue;Reduced posterior propulsion;Right anterior bolus loss;Delayed oral transit;Decreased bolus cohesion Oral - Thin Straw -- Oral - Puree Right anterior bolus loss;Weak lingual manipulation;Reduced posterior propulsion;Lingual/palatal residue;Delayed oral transit;Decreased bolus cohesion;Premature spillage Oral - Mech Soft -- Oral - Regular -- Oral - Multi-Consistency -- Oral - Pill -- Oral Phase - Comment --  CHL IP PHARYNGEAL PHASE 06/29/2015 Pharyngeal Phase Impaired Pharyngeal- Pudding Teaspoon -- Pharyngeal -- Pharyngeal- Pudding Cup -- Pharyngeal -- Pharyngeal- Honey Teaspoon -- Pharyngeal -- Pharyngeal- Honey Cup -- Pharyngeal -- Pharyngeal-  Nectar Teaspoon Delayed swallow initiation-vallecula;Reduced anterior laryngeal mobility;Reduced laryngeal elevation;Reduced tongue base retraction;Reduced airway/laryngeal closure;Pharyngeal residue - posterior pharnyx;Pharyngeal residue - valleculae Pharyngeal -- Pharyngeal- Nectar Cup Delayed swallow initiation-pyriform sinuses;Reduced anterior laryngeal mobility;Reduced laryngeal elevation;Reduced airway/laryngeal closure;Reduced tongue base retraction;Pharyngeal residue - valleculae;Pharyngeal residue - posterior pharnyx;Penetration/Aspiration during swallow Pharyngeal Material enters airway, CONTACTS cords and then ejected out Pharyngeal- Nectar Straw Reduced anterior laryngeal mobility;Reduced laryngeal elevation;Reduced airway/laryngeal closure;Reduced tongue base retraction;Pharyngeal residue - valleculae;Pharyngeal residue - posterior pharnyx;Pharyngeal residue - pyriform Pharyngeal -- Pharyngeal- Thin Teaspoon Delayed swallow initiation-vallecula;Reduced anterior laryngeal mobility;Reduced laryngeal elevation;Reduced airway/laryngeal closure;Reduced tongue base retraction;Pharyngeal residue - valleculae;Pharyngeal residue - pyriform;Penetration/Aspiration during swallow Pharyngeal Material enters airway, CONTACTS cords and then ejected out Pharyngeal- Thin Cup Delayed swallow initiation-pyriform sinuses;Reduced anterior laryngeal mobility;Reduced laryngeal elevation;Reduced airway/laryngeal closure;Reduced tongue base retraction;Penetration/Aspiration during swallow;Pharyngeal residue - valleculae;Pharyngeal residue - pyriform Pharyngeal Material enters airway, passes BELOW cords without attempt by patient to eject out (silent aspiration) Pharyngeal- Thin Straw -- Pharyngeal -- Pharyngeal- Puree Delayed swallow initiation-vallecula;Reduced anterior laryngeal mobility;Reduced laryngeal elevation;Reduced airway/laryngeal closure;Reduced tongue base retraction;Pharyngeal residue - valleculae;Pharyngeal  residue - pyriform Pharyngeal -- Pharyngeal- Mechanical Soft -- Pharyngeal -- Pharyngeal- Regular -- Pharyngeal -- Pharyngeal- Multi-consistency -- Pharyngeal -- Pharyngeal- Pill -- Pharyngeal -- Pharyngeal Comment --  No flowsheet data found. No flowsheet data found. Page, [name] 06/29/2015, 3:21 PM                CBC  Recent Labs Lab 07/04/15 0504  07/04/15 1725 07/04/15 2100 07/05/15 0443 07/06/15 0457 07/07/15 0530 07/08/15 0430  WBC 4.2  --  4.1  --  5.4 5.7 5.6  --   HGB 7.3*  --  8.2* 9.0* 8.9* 8.9* 8.8* 8.9*  HCT 24.3*  < > 26.4* 28.9* 28.9* 28.4* 28.5* 28.7*  PLT 201  --  199  --  193 193 183  --   MCV 87.1  --  87.7  --  85.5 85.3 85.3  --   MCH 26.2  --  27.2  --  26.3 26.7 26.3  --   MCHC 30.0  --  31.1  --  30.8 31.3 30.9  --   RDW 17.9*  --  17.6*  --  17.8* 17.3* 17.4*  --   LYMPHSABS  --   --   --   --  0.7  --   --   --   MONOABS  --   --   --   --  0.7  --   --   --   EOSABS  --   --   --   --  0.2  --   --   --   BASOSABS  --   --   --   --  0.0  --   --   --   < > = values in this interval not displayed.  Chemistries   Recent Labs Lab 07/05/15 0443 07/07/15 1430  NA 134* 136  K 4.5 5.1  CL 105 105  CO2 22 22  GLUCOSE 128* 92  BUN <5* 10  CREATININE 1.14 1.51*  CALCIUM 9.6 9.8  AST  --  20  ALT  --  16*  ALKPHOS  --  102  BILITOT  --  0.5   ------------------------------------------------------------------------------------------------------------------ estimated creatinine clearance is 46.4 mL/min (by C-G formula based on Cr of 1.51). ------------------------------------------------------------------------------------------------------------------ No results for input(s): HGBA1C in the last 72 hours. ------------------------------------------------------------------------------------------------------------------ No results for input(s): CHOL, HDL, LDLCALC, TRIG, CHOLHDL, LDLDIRECT in the last 72  hours. ------------------------------------------------------------------------------------------------------------------ No results for input(s): TSH, T4TOTAL, T3FREE, THYROIDAB in the last 72 hours.  Invalid input(s): FREET3 ------------------------------------------------------------------------------------------------------------------ No results for input(s): VITAMINB12, FOLATE, FERRITIN, TIBC, IRON, RETICCTPCT in the last 72 hours.  Coagulation profile No results for input(s): INR, PROTIME in the last 168 hours.  No results for input(s): DDIMER in the last 72 hours.  Cardiac Enzymes No results for input(s): CKMB, TROPONINI, MYOGLOBIN in the last 168 hours.  Invalid input(s): CK ------------------------------------------------------------------------------------------------------------------ Invalid input(s): POCBNP   CBG:  Recent Labs Lab 07/07/15 0647 07/07/15 1130 07/07/15 1623 07/07/15 2040 07/08/15 0655  GLUCAP 136* 143* 106* 96 112*       Studies: Dg Chest Port 1 View  07/06/2015  CLINICAL DATA:  COUGH.HX MI,CA EXAM: PORTABLE CHEST 1 VIEW COMPARISON:  07/02/2015 FINDINGS: Heart size is enlarged. Right-sided Port-A-Cath tip to the level of the lower superior vena cava. There is patchy density in the left upper lobe, stable compared with prior study. Mild interstitial edema is noted. IMPRESSION: 1. Persistent left upper lobe density. 2. Mild interstitial edema. Electronically Signed   By: Nolon Nations M.D.   On: 07/06/2015 14:09      Lab Results  Component Value Date   HGBA1C 6.8* 06/14/2015   HGBA1C 7.4* 04/17/2015   HGBA1C 8.0* 05/22/2013   Lab Results  Component Value Date   LDLCALC 145* 06/14/2015   CREATININE 1.51* 07/07/2015       Scheduled Meds: . amiodarone  200 mg Oral Daily  . antiseptic oral rinse  7 mL Mouth Rinse BID  . atorvastatin  40 mg Oral q1800  . diltiazem  60 mg Oral BID  . glimepiride  1 mg Oral QAC breakfast  . insulin  aspart  0-24 Units Subcutaneous TID AC & HS  . iron polysaccharides  150 mg Oral Daily  . lactose free nutrition  237 mL Oral TID WC  . levETIRAcetam  1,000 mg Intravenous Q12H  . metFORMIN  1,000 mg Oral Q breakfast  . metFORMIN  500 mg Oral Q supper  . pantoprazole sodium  40 mg Oral Daily  . rivaroxaban  15 mg Oral Q supper  . vitamin B-12  1,000 mcg Oral Daily   Continuous Infusions:     LOS: 17 days    Time spent: >20 [facility] Pager [phone]. If 7PM-7AM, please contact night-coverage at [email or web address], password [facility] 07/08/2015, 7:53 AM  LOS: 17 days

## 2015-07-08 NOTE — Progress Notes (Signed)
Subjective/Complaints: Pt sitting up in bed.  Per nursing, pt confused overnight, trying to get out of bed and tearing his briefs.  This AM, he is calm and slightly confused, but oriented.   Review of systems: aphasia limiting review, however, appears to deny CP, SOB, N/V/D.  Objective: Vital Signs: Blood pressure 123/62, pulse 100, temperature 97.6 F (36.4 C), temperature source Oral, resp. rate 16, weight 92.126 kg (203 lb 1.6 oz), SpO2 92 %. Dg Chest Port 1 View  07/06/2015  CLINICAL DATA:  COUGH.HX MI,CA EXAM: PORTABLE CHEST 1 VIEW COMPARISON:  07/02/2015 FINDINGS: Heart size is enlarged. Right-sided Port-A-Cath tip to the level of the lower superior vena cava. There is patchy density in the left upper lobe, stable compared with prior study. Mild interstitial edema is noted. IMPRESSION: 1. Persistent left upper lobe density. 2. Mild interstitial edema. Electronically Signed   By: Nolon Nations M.D.   On: 07/06/2015 14:09   Results for orders placed or performed during the hospital encounter of 07/03/2015 (from the past 72 hour(s))  Glucose, capillary     Status: Abnormal   Collection Time: 07/05/15 11:59 AM  Result Value Ref Range   Glucose-Capillary 120 (H) 65 - 99 mg/dL  Glucose, capillary     Status: Abnormal   Collection Time: 07/05/15  5:20 PM  Result Value Ref Range   Glucose-Capillary 146 (H) 65 - 99 mg/dL  Glucose, capillary     Status: Abnormal   Collection Time: 07/05/15  9:17 PM  Result Value Ref Range   Glucose-Capillary 132 (H) 65 - 99 mg/dL  CBC     Status: Abnormal   Collection Time: 07/06/15  4:57 AM  Result Value Ref Range   WBC 5.7 4.0 - 10.5 K/uL   RBC 3.33 (L) 4.22 - 5.81 MIL/uL   Hemoglobin 8.9 (L) 13.0 - 17.0 g/dL   HCT 28.4 (L) 39.0 - 52.0 %   MCV 85.3 78.0 - 100.0 fL   MCH 26.7 26.0 - 34.0 pg   MCHC 31.3 30.0 - 36.0 g/dL   RDW 17.3 (H) 11.5 - 15.5 %   Platelets 193 150 - 400 K/uL  Glucose, capillary     Status: Abnormal   Collection Time:  07/06/15  6:03 AM  Result Value Ref Range   Glucose-Capillary 157 (H) 65 - 99 mg/dL  Glucose, capillary     Status: Abnormal   Collection Time: 07/06/15 12:18 PM  Result Value Ref Range   Glucose-Capillary 146 (H) 65 - 99 mg/dL  Glucose, capillary     Status: Abnormal   Collection Time: 07/06/15  4:35 PM  Result Value Ref Range   Glucose-Capillary 116 (H) 65 - 99 mg/dL  Glucose, capillary     Status: Abnormal   Collection Time: 07/06/15  9:00 PM  Result Value Ref Range   Glucose-Capillary 117 (H) 65 - 99 mg/dL  Occult blood card to lab, stool RN will collect     Status: None   Collection Time: 07/07/15  2:37 AM  Result Value Ref Range   Fecal Occult Bld NEGATIVE NEGATIVE  CBC     Status: Abnormal   Collection Time: 07/07/15  5:30 AM  Result Value Ref Range   WBC 5.6 4.0 - 10.5 K/uL   RBC 3.34 (L) 4.22 - 5.81 MIL/uL   Hemoglobin 8.8 (L) 13.0 - 17.0 g/dL   HCT 28.5 (L) 39.0 - 52.0 %   MCV 85.3 78.0 - 100.0 fL   MCH 26.3 26.0 - 34.0  pg   MCHC 30.9 30.0 - 36.0 g/dL   RDW 17.4 (H) 11.5 - 15.5 %   Platelets 183 150 - 400 K/uL  Glucose, capillary     Status: Abnormal   Collection Time: 07/07/15  6:47 AM  Result Value Ref Range   Glucose-Capillary 136 (H) 65 - 99 mg/dL  Glucose, capillary     Status: Abnormal   Collection Time: 07/07/15 11:30 AM  Result Value Ref Range   Glucose-Capillary 143 (H) 65 - 99 mg/dL  Comprehensive metabolic panel     Status: Abnormal   Collection Time: 07/07/15  2:30 PM  Result Value Ref Range   Sodium 136 135 - 145 mmol/L   Potassium 5.1 3.5 - 5.1 mmol/L   Chloride 105 101 - 111 mmol/L   CO2 22 22 - 32 mmol/L   Glucose, Bld 92 65 - 99 mg/dL   BUN 10 6 - 20 mg/dL   Creatinine, Ser 1.51 (H) 0.61 - 1.24 mg/dL   Calcium 9.8 8.9 - 10.3 mg/dL   Total Protein 7.0 6.5 - 8.1 g/dL   Albumin 3.3 (L) 3.5 - 5.0 g/dL   AST 20 15 - 41 U/L   ALT 16 (L) 17 - 63 U/L   Alkaline Phosphatase 102 38 - 126 U/L   Total Bilirubin 0.5 0.3 - 1.2 mg/dL   GFR calc non  Af Amer 43 (L) >60 mL/min   GFR calc Af Amer 50 (L) >60 mL/min    Comment: (NOTE) The eGFR has been calculated using the CKD EPI equation. This calculation has not been validated in all clinical situations. eGFR's persistently <60 mL/min signify possible Chronic Kidney Disease.    Anion gap 9 5 - 15  Glucose, capillary     Status: Abnormal   Collection Time: 07/07/15  4:23 PM  Result Value Ref Range   Glucose-Capillary 106 (H) 65 - 99 mg/dL  Glucose, capillary     Status: None   Collection Time: 07/07/15  8:40 PM  Result Value Ref Range   Glucose-Capillary 96 65 - 99 mg/dL   Comment 1 Notify RN   Hemoglobin and hematocrit, blood     Status: Abnormal   Collection Time: 07/08/15  4:30 AM  Result Value Ref Range   Hemoglobin 8.9 (L) 13.0 - 17.0 g/dL   HCT 28.7 (L) 39.0 - 52.0 %  Glucose, capillary     Status: Abnormal   Collection Time: 07/08/15  6:55 AM  Result Value Ref Range   Glucose-Capillary 112 (H) 65 - 99 mg/dL   Comment 1 Notify RN      HEENT: Normocephalic. Atraumatic Cardio: irregular irregular Resp: CTA B/L and unlabored GI: BS positive and nontender nondistended Skin:   Intact. Warm and dry. Neuro: Flat Motor RUE/RLE: 0/5 Dysarthric and Aphasic Mild facial droop Musc/Skel:  No edema. No tenderness. Gen. no acute distress. Vital signs reviewed.   Assessment/Plan: 1. 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 Physiatrist is providing close team supervision and 24 hour management of active medical problems listed below. Physiatrist and rehab team continue to assess barriers to discharge/monitor patient progress toward functional and medical goals. FIM: Function - Bathing Position: Bed Body parts bathed by patient: Chest, Abdomen, Right arm, Front perineal area Body parts bathed by helper: Back, Buttocks Bathing not  applicable: Right upper leg, Left upper leg, Right lower leg, Left lower leg  Assist Level: 2 helpers (Total assist)  Function- Upper Body Dressing/Undressing What is the patient wearing?: Pull over shirt/dress Pull over shirt/dress - Perfomed by patient: Thread/unthread left sleeve, Put head through opening Pull over shirt/dress - Perfomed by helper: Pull shirt over trunk, Thread/unthread right sleeve Assist Level: 2 helpers (Max assist, +2 for sitting balance) Function - Lower Body Dressing/Undressing What is the patient wearing?: Pants, Non-skid slipper socks Position: Bed Pants- Performed by helper: Thread/unthread right pants leg, Thread/unthread left pants leg, Pull pants up/down Non-skid slipper socks- Performed by helper: Don/doff right sock, Don/doff left sock Assist for footwear: Dependant Assist for lower body dressing:  (Total assist)  Function - Toileting Toileting activity did not occur: No continent bowel/bladder event Assist level: Two helpers (per [employer], NT)  Function - Air cabin crew transfer activity did not occur: Safety/medical concerns Assist level to toilet: 2 helpers (per Berkley Harvey, NT) Assist level from toilet: 2 helpers  Function - Chair/bed transfer Chair/bed transfer activity did not occur: Safety/medical concerns Chair/bed transfer method: Lateral scoot Chair/bed transfer assist level: Maximal assist (Pt 25 - 49%/lift and lower) Chair/bed transfer assistive device: Armrests, Sliding board Mechanical lift: Stedy Chair/bed transfer details: Verbal cues for precautions/safety, Manual facilitation for weight shifting, Manual facilitation for placement, Verbal cues for sequencing, Verbal cues for technique, Tactile cues for placement, Tactile cues for posture, Tactile cues for weight shifting  Function - Locomotion: Wheelchair Will patient use wheelchair at discharge?: Yes Type: Manual Max wheelchair distance: 10 Assist Level: Maximal  assistance (Pt 25 - 49%) Assist Level: Maximal assistance (Pt 25 - 49%) Wheel 150 feet activity did not occur: Safety/medical concerns Turns around,maneuvers to table,bed, and toilet,negotiates 3% grade,maneuvers on rugs and over doorsills: No Function - Locomotion: Ambulation Ambulation activity did not occur: Safety/medical concerns Assistive device: Other (comment) (three muskateers) Max distance: 25 Assist level: 2 helpers Assist level: 2 helpers  Function - Comprehension Comprehension: Auditory Comprehension assistive device: Hearing aids Comprehension assist level: Understands basic 75 - 89% of the time/ requires cueing 10 - 24% of the time  Function - Expression Expression: Verbal Expression assist level: Expresses basic 50 - 74% of the time/requires cueing 25 - 49% of the time. Needs to repeat parts of sentences.  Function - Social Interaction Social Interaction assist level: Interacts appropriately 50 - 74% of the time - May be physically or verbally inappropriate.  Function - Problem Solving Problem solving assist level: Solves basic 50 - 74% of the time/requires cueing 25 - 49% of the time  Function - Memory Memory assist level: Recognizes or recalls 50 - 74% of the time/requires cueing 25 - 49% of the time Patient normally able to recall (first 3 days only): Current season    Medical Problem List and Plan: 1.  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 Plan to follow-up MRI of the brain after ICH absorbed for further evaluation of possible metastases posterior left frontal lobe.   Cont CIR  Plan to repeat MRI next week prior to discharge with neurology to follow up, Xarelto for stroke prophylaxis 2.  DVT Prophylaxis/Anticoagulation: SCDs. Monitor for signs of DVT, Xarelto should help with this as well 3. Pain Management: Ultram and oxycodone as needed 4. Mood: Xanax 0.25 mg 3 times daily as needed 5. Neuropsych: This patient  is not capable of making decisions on his own behalf with caregiver/staff assistance. 6. Skin/Wound Care: Routine skin checks 7. Fluids/Electrolytes/Nutrition: Routine I&O's with follow-up chemistries,severe swallowing  difficulties, meal intake is 100%   8. Seizure prophylaxis. Keppra now at 1000 mg twice a day---more alert. EEG displayed seizure activity 9. Atrial fibrillation. Cardiac rate control. Continue amiodarone 200 mg daily, Cardizem 60 mg twice a day.Eliquis held secondary to [place], family does not want to resume, would be comfortable with Xarelto , Will ask pharmacy to initiate low-dose due to stable hemoglobin Filed Vitals:   07/07/15 1418 07/08/15 0540  BP: 117/53 123/62  Pulse: 98 100  Temp: 98.4 F (36.9 C) 97.6 F (36.4 C)  Resp: 18 16   10. 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 This is likely the etiology of the persistent cough, Right lung nodules,As noted plan follow-up MRI after ICH absorbed for further evaluation of possible metastasis 11. Diabetes mellitus with peripheral neuropathy. Hemoglobin A1c 6.8. Glucophage 1000 mg at breakfast and 500 mg supper , Generally well controlled on the current regimen Amaryl 1 mg daily. Check blood sugars before meals and at bedtime CBG (last 3) generally controlled  Recent Labs  07/07/15 1623 07/07/15 2040 07/08/15 0655  GLUCAP 106* 96 112*   12. Hyperlipidemia. Lipitor 13. UTI enterobacter cloacae- Sensitive to ceftriaxone. We'll start 1 g every 24 hours.  Afebrile , 7 day course    Will complete on Monday           14. Anemia stool guaiac negative X 2, thus far looks like normocytic anemia of chronic disease exacerbated by frequent blood draws while on IV heparin.started pt on Iron supplement .  As per internal medicine now on Xarelto. This was also Recommended by neurology.  Hemoglobin stable at 8.9 on 6/29.      LOS (Days) 17 A FACE TO FACE EVALUATION  WAS PERFORMED  Aziah Kaiser Lorie Phenix 07/08/2015, 10:40 AM

## 2015-07-08 NOTE — Progress Notes (Signed)
Patient used his call bell to call out at the station. When answered, the patient was not legible, so went into the room. Patient had pulled off all his covers & had picked his brief in pieces. All of the items were on the floor on the right side of his bed, except for some of the brief stuffing that was in his bed. His condom catheter had been removed as well. When asked why, patient stated that he did not know why he did it. Patient seemed confused. Brief pieces did not appear wet, only his turning pad was a little damp, but was more like perspiration, no urine odor. Patient was cleaned up & a new condom cath, brief & linen was placed. No signs of acute distress noted, no labored breathing, no diaphoresis. Patient was calm. Explained to patient the indication of use for the condom catheter. He verbalized understanding, but last week, patient did similar events. He had taken off the condom catheter at one time & another he took off his brief.  Will continue to monitor & give report to oncoming shift.

## 2015-07-08 NOTE — Progress Notes (Signed)
Speech Language Pathology Daily Session Note  Patient Details  Name: Ivan Beasley MRN: [id number] Date of Birth: September 28, 1940  Today's Date: 07/08/2015 SLP Individual Time: 1400-1430 SLP Individual Time Calculation (min): 30 min  Short Term Goals: Week 3: SLP Short Term Goal 1 (Week 3): Pt will be intelligible in phrases with min assist verbal cues for overarticulation, increased vocal intensity,  SLP Short Term Goal 2 (Week 3): Pt will consume Dys 1 textures and nectar thick liquids with supervision verbal cues for use of swallowing precautions and minimal overt s/s of aspiration.  SLP Short Term Goal 3 (Week 3): Pt will consume therapeutic trials of thin liquids via teaspoon with min assist verbal cues for use of swallowing precautions and minimal overt s/s of aspiration.  SLP Short Term Goal 4 (Week 3): Pt will return demonstration of pharyngeal strengthening exercises for 25 repetitions each with min assist verbal cues.   SLP Short Term Goal 5 (Week 3): Pt will sustain his attention to basic tasks for 5 minute intervals with min verbal cues for redirection.   SLP Short Term Goal 6 (Week 3): Pt will return demonstration of IMST and RMST devices with supervision verbal cues at 9 cm H2O and 15 cm H2O respectively with effort level rated <7/10 over three consecutive sessions.   Skilled Therapeutic Interventions: Skilled treatment session focused on dysphagia and speech goals. SLP facilitated session by providing mod A verbal cues for swallow initiation and complete oral clearing of trials of dysphagia 2. Pt with min oral residue on right but was able to clear with lingual sweep and Mod A verbal cues. Pt ~80% intelligible at the word level with Min A verbal cues during barrier task. Pt able to sustain attention to basic tasks for 5 minute intervals with Mod A verbal cues. Pt left in bed with wife present and all needs within reach. Continue current plan of care.   Function:  Eating Eating    Modified Consistency Diet: Yes Eating Assist Level: Helper feeds patient;Supervision or verbal cues;More than reasonable amount of time;Helper scoops food on utensil   Eating Set Up Assist For: Opening containers Helper Spencer on Utensil: Every scoop     Cognition Comprehension Comprehension assist level: Understands basic 75 - 89% of the time/ requires cueing 10 - 24% of the time  Expression   Expression assist level: Expresses basic 50 - 74% of the time/requires cueing 25 - 49% of the time. Needs to repeat parts of sentences.  Social Interaction Social Interaction assist level: Interacts appropriately 50 - 74% of the time - May be physically or verbally inappropriate.  Problem Solving Problem solving assist level: Solves basic 50 - 74% of the time/requires cueing 25 - 49% of the time  Memory Memory assist level: Recognizes or recalls 50 - 74% of the time/requires cueing 25 - 49% of the time    Pain Pain Assessment Pain Assessment: No/denies pain  Therapy/Group: Individual Therapy  Kalya Troeger 07/08/2015, 4:07 PM

## 2015-07-09 ENCOUNTER — Inpatient Hospital Stay (HOSPITAL_COMMUNITY): Payer: Medicare HMO | Admitting: Speech Pathology

## 2015-07-09 ENCOUNTER — Inpatient Hospital Stay (HOSPITAL_COMMUNITY): Payer: Medicare HMO | Admitting: Occupational Therapy

## 2015-07-09 ENCOUNTER — Inpatient Hospital Stay (HOSPITAL_COMMUNITY): Payer: Medicare HMO | Admitting: Physical Therapy

## 2015-07-09 DIAGNOSIS — F05 Delirium due to known physiological condition: Secondary | ICD-10-CM | POA: Insufficient documentation

## 2015-07-09 DIAGNOSIS — D62 Acute posthemorrhagic anemia: Secondary | ICD-10-CM | POA: Insufficient documentation

## 2015-07-09 DIAGNOSIS — G8101 Flaccid hemiplegia affecting right dominant side: Secondary | ICD-10-CM

## 2015-07-09 LAB — CBC WITH DIFFERENTIAL/PLATELET
BASOS ABS: 0 10*3/uL (ref 0.0–0.1)
BASOS PCT: 0 %
EOS ABS: 0.2 10*3/uL (ref 0.0–0.7)
EOS PCT: 4 %
HEMATOCRIT: 29.4 % — AB (ref 39.0–52.0)
HEMOGLOBIN: 9.1 g/dL — AB (ref 13.0–17.0)
LYMPHS PCT: 14 %
Lymphs Abs: 0.9 10*3/uL (ref 0.7–4.0)
MCH: 27.2 pg (ref 26.0–34.0)
MCHC: 31 g/dL (ref 30.0–36.0)
MCV: 88 fL (ref 78.0–100.0)
MONO ABS: 1 10*3/uL (ref 0.1–1.0)
Monocytes Relative: 15 %
Neutro Abs: 4.3 10*3/uL (ref 1.7–7.7)
Neutrophils Relative %: 67 %
Platelets: 216 10*3/uL (ref 150–400)
RBC: 3.34 MIL/uL — ABNORMAL LOW (ref 4.22–5.81)
RDW: 17.3 % — ABNORMAL HIGH (ref 11.5–15.5)
WBC: 6.3 10*3/uL (ref 4.0–10.5)

## 2015-07-09 LAB — URINALYSIS, ROUTINE W REFLEX MICROSCOPIC
Bilirubin Urine: NEGATIVE
Glucose, UA: NEGATIVE mg/dL
Hgb urine dipstick: NEGATIVE
Ketones, ur: NEGATIVE mg/dL
Leukocytes, UA: NEGATIVE
Nitrite: NEGATIVE
Protein, ur: NEGATIVE mg/dL
Specific Gravity, Urine: 1.017 (ref 1.005–1.030)
pH: 6 (ref 5.0–8.0)

## 2015-07-09 LAB — BASIC METABOLIC PANEL
ANION GAP: 10 (ref 5–15)
BUN: 12 mg/dL (ref 6–20)
CHLORIDE: 103 mmol/L (ref 101–111)
CO2: 22 mmol/L (ref 22–32)
CREATININE: 1.36 mg/dL — AB (ref 0.61–1.24)
Calcium: 9.6 mg/dL (ref 8.9–10.3)
GFR calc non Af Amer: 49 mL/min — ABNORMAL LOW (ref >60)
GFR, EST AFRICAN AMERICAN: 57 mL/min — AB (ref >60)
GLUCOSE: 99 mg/dL (ref 65–99)
Potassium: 4.5 mmol/L (ref 3.5–5.1)
Sodium: 135 mmol/L (ref 135–145)

## 2015-07-09 LAB — GLUCOSE, CAPILLARY
GLUCOSE-CAPILLARY: 127 mg/dL — AB (ref 65–99)
GLUCOSE-CAPILLARY: 115 mg/dL — AB (ref 65–99)
Glucose-Capillary: 134 mg/dL — ABNORMAL HIGH (ref 65–99)
Glucose-Capillary: 139 mg/dL — ABNORMAL HIGH (ref 65–99)
Glucose-Capillary: 120 mg/dL — ABNORMAL HIGH (ref 65–99)

## 2015-07-09 MED ORDER — SODIUM CHLORIDE 0.9 % IV SOLN
INTRAVENOUS | Status: DC
  Administered 2015-07-09: 50 mL via INTRAVENOUS
  Administered 2015-07-09 – 2015-07-14 (×3): via INTRAVENOUS

## 2015-07-09 NOTE — Progress Notes (Signed)
Nutrition Follow-up  DOCUMENTATION CODES:   Non-severe (moderate) malnutrition in context of chronic illness  INTERVENTION:  Continue Boost Plus po TID (thickened to nectar thick liquids), each supplement provides 360 kcal and 14 grams of protein.   Continue Ensure Enlive po TID PRN (thickened to nectar thick liquids), each supplement provides 350 kcal and 20 grams of protein.  Encourage adequate PO intake.   NUTRITION DIAGNOSIS:   Malnutrition related to chronic illness as evidenced by moderate depletions of muscle mass, moderate depletion of body fat, percent weight loss; ongoing  GOAL:   Patient will meet greater than or equal to 90% of their needs;   MONITOR:   PO intake, Supplement acceptance, Weight trends, Labs, I & O's  REASON FOR ASSESSMENT:   Malnutrition Screening Tool    ASSESSMENT:   75 y.o. right handed male with history of diabetes mellitus, coronary artery disease with CABG, atrial fibrillation on Eliquis and recent diagnosis of left lung squamous cell cancer April 2016 with subsequent chemotherapy radiation but developed abd lymph node enlargement treated with immunosuppressant therapy April 2017  Presented with 06/13/2015 to [facility] with altered mental status and right-sided weakness. CT of the head revealed left frontal intracerebral hemorrhage with subarachnoid hemorrhage. 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  Meal completion has been mostly 75-100%. Pt additionally has been consuming his Boost Shakes more. RD to continue with current orders to aid in caloric and protein needs. Labs and medications reviewed.   Diet Order:  DIET - DYS 1 Room service appropriate?: Yes; Fluid consistency:: Nectar Thick  Skin:  Reviewed, no issues  Last BM:  6/28  Height:   Ht Readings from Last 1 Encounters:  06/13/15 6' (1.829 m)    Weight:   Wt Readings from Last 1 Encounters:  07/09/15 202 lb 6.1 oz (91.8 kg)    Ideal Body Weight:  80.9 kg  BMI:   Body mass index is 27.44 kg/(m^2).  Estimated Nutritional Needs:   Kcal:  2150-2350  Protein:  110-120 grams  Fluid:  2.1 - 2.3 L/day  EDUCATION NEEDS:   No education needs identified at this time  Corrin Parker, MS, RD, LDN Pager # [phone] After hours/ weekend pager # [phone]

## 2015-07-09 NOTE — Progress Notes (Signed)
Social Work Patient ID: Ivan Beasley, male   DOB: Jul 16, 1940, 75 y.o.   MRN: [id number] Met with wife who had questions regarding a ramp and transportation home and to MD appointments. Discussed if pt is going home he will need a ramp up the steps into home. Wife wanted to know if insurance would cover this, informed it would not. She will pursue this. Also discussed private [facility] transportation services and if necessary ambulance service can be used. Wife wants to take pt home due to that is where he wants to be. She is working along with the therapy team to learn his care and see if she can manage him at home. She wants him to get home due to feels with All of his medical issues and cancer diagnosis he may not be given this choice again. She wants to follow his wishes.  She is aware if more questions come up while Sonia Baller is off to find this worker.  Work on a safe discharge Plan for all.

## 2015-07-09 NOTE — Progress Notes (Signed)
Note for 07/08/15 0700. Patient was noted pulling at covers & removing them from the bed again. Went into the room & patient looked at this nurse & asked where he was. I replied to him that he was in the hospital. He then asked what? Replied, [facility]. He asked why? Questioned patient if he didn't remember why he was here & he stated no. When patient was informed, he asked where his wife was. Patient was very confused. When left in the room, the oncoming nurse had come to get report. We started with him & she questioned him as well. He answered where he was but was still confused. At that time, physician had come onto the floor & was informed of confusion prior on the shift & at that moment. Care was transferred to oncoming nurse awaiting orders after evaluation by MD

## 2015-07-09 NOTE — Consult Note (Signed)
Radiation Oncology         [phone] ________________________________  Initial inpatient Consultation  Name: Ivan Beasley MRN: [id number]  Date: 06/14/2015  DOB: Jul 12, 1940  YN:WGNF,AOZHY C, MD  No ref. provider found   REFERRING PHYSICIAN: No ref. provider found  DIAGNOSIS: The primary encounter diagnosis was Malignant neoplasm of left lung, unspecified part of lung ([place]). Diagnoses of Cough, Slurred speech, Mental confusion, Stroke-like symptoms, Anemia, Confusion, Right flaccid hemiparesis (HCC), Nontraumatic subcortical hemorrhage of left cerebral hemisphere [facility]), and Brain metastases ([place]), possible were also pertinent to this visit.    ICD-9-CM ICD-10-CM   1. Malignant neoplasm of left lung, unspecified part of lung (HCC) 162.9 C34.92   2. Cough 786.2 R05 DG Chest 2 View     DG Chest 2 View     DG Chest 2 View     DG Chest 2 View     DG CHEST PORT 1 VIEW     DG CHEST PORT 1 VIEW  3. Slurred speech 784.59 R47.81 CT HEAD WO CONTRAST     CT HEAD WO CONTRAST  4. Mental confusion 298.9 R41.0 DG Chest 2 View     DG Chest 2 View  5. Stroke-like symptoms 781.99 R29.90 MR Brain W Wo Contrast     MR Brain W Wo Contrast     CANCELED: MR Brain W Contrast     CANCELED: MR Brain W Contrast     CANCELED: MR Brain Wo Contrast     CANCELED: MR Brain Wo Contrast  6. Anemia 285.9 D64.9 CT ABDOMEN PELVIS WO CONTRAST     CT ABDOMEN PELVIS WO CONTRAST     CANCELED: CT ABDOMEN PELVIS WO CONTRAST     CANCELED: CT ABDOMEN PELVIS WO CONTRAST  7. Confusion 298.9 R41.0 DG Chest 1 View     DG Chest 1 View  8. Right flaccid hemiparesis (HCC) 342.00 G81.01   9. Nontraumatic subcortical hemorrhage of left cerebral hemisphere ([place]) 431 I61.0   10. Brain metastases ([place]), possible 198.3 C79.31     HISTORY OF PRESENT ILLNESS: [name] is a 75 y.o. male seen at the request of Dr. Letta Pate in physical medicine. 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 He was found to benefit from PDL agents and received 6 cycles of Keytruda which was discontinued in April 2017 due to drug induced pneumonitis which he was admitted to [facility]. He recovered with supportive care, but did get diagnosed with A Fib during that stay and was started on Eliquis. Following discharge, he was supposed to move forward with repeat imaging though was admitted early this month with possible CVA at [facility]. He was taken in transfer to the neurology service at [facility] however on 06/13/15 due to right sided hemiplegia. A CT of the head had revealed an intracerebral hemorrhage with subarachnoid hemorrhage and this was felt to be due to eliquis, which was subsequently discontinued. He has undergone an MRI of the brain on 06/14/15 revealing a contour of enhancement of the left frontal lobe hematoma and this measured 2.5 x 2.2 x 2cm with a 42m rounded area at the posterior margin was seen. Another area in the anterior left parietal lobe with surrounding vasogenic edema was noted. A repeat MRI on 6/17/17was performed revealing acute ischemic MCA infact adjacent to the posterior left frontal hemorrhagic lesion and involution of the left  frontal lobe lesion with edema, and the previous nodular 11m focus was not visualized. A 9 x 11 x11 mm lesion on the left parietal lobe was felt to be similar in size from previous scan. He has been on Keppra 10040mBID and is off of steroids at this time. He was started back on anticoagulation on 07/06/15 with Xarelto.  The patient has been under the care of physical medicine, trying to rehab from the neurologic deficits and has had some improvement but continues to have limited function of his right upper and lower extremities.  PREVIOUS RADIATION THERAPY: Yes  Received conventional radiotherapy to the left lung in May 2016 with Dr. ChBaruch Goutytotal Gy is not known.  PAST  MEDICAL HISTORY:  Past Medical History  Diagnosis Date  . Coronary artery disease   . Pneumonia 2000  . Arthritis   . 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 Bruises easily   . GERD (gastroesophageal reflux disease)     takes Omeprazole daily as needed for stomach pain  . Blood transfusion 2001  . Diabetes mellitus     takes Metformin daily;  . Impaired hearing     bil hearing aide  . Macular degeneration     being watched for this but hasn't been "completely" diagnosed  . [facility]  [facility]      PAST SURGICAL HISTORY: Past Surgical History  Procedure Laterality Date  . 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 Cardiac catheterization  2001  . Tonsillectomy      as a child  . Colonoscopy    . Cataract extraction      bilateral  . Lumbar laminectomy/decompression microdiscectomy  12/19/2010    Procedure: LUMBAR LAMINECTOMY/DECOMPRESSION MICRODISCECTOMY;  Surgeon: GaElaina Hoops [place];  Service: Neurosurgery;  Laterality: N/A;  Lumbar three-four, four-five decompressive lumbar laminectomy  . Joint replacement  right tkr  . Eye surgery      cataracts  . Back surgery      l4 5  . Portacath placement N/A 05/20/2014    Procedure: INSERTION PORT-A-CATH;  Surgeon: TiNestor LewandowskyMD;  Location: [facility] ORS;  Service: General;  Laterality: N/A;    FAMILY HISTORY:  Family History  Problem Relation Age of Onset  . Anesthesia problems Neg Hx   . Hypotension Neg Hx   . Malignant hyperthermia Neg Hx   . Pseudochol deficiency Neg Hx   . Diabetes type II Other     SOCIAL HISTORY:  Social History   Social History  . Marital Status: Married    Spouse Name: N/A  . Number of Children: N/A  . Years of Education: N/A   Occupational History  . Not on file.   Social History Main Topics  . Smoking status: Former Smoker -- 40 years    Types: Cigarettes    Quit date: 05/19/1999  . Smokeless tobacco: Never Used      Comment: 2001  . Alcohol Use: No  . Drug Use: No  . Sexual Activity: No   Other Topics Concern  . Not on file   Social History Narrative  The patient is married, [place] prior to [place] had been very active in caring for his property.  ALLERGIES: Review of patient's allergies indicates no known allergies.  MEDICATIONS:  Current Facility-Administered Medications  Medication Dose Route Frequency Provider Last  Rate Last Dose  . 0.9 %  sodium chloride infusion   Intravenous Continuous Bonnell Public, MD 50 mL/hr at 07/09/15 1058    . acetaminophen (TYLENOL) tablet 650 mg  650 mg Oral Q4H PRN Lavon Paganini Angiulli, PA-C   650 mg at 07/09/15 2595   Or  . acetaminophen (TYLENOL) suppository 650 mg  650 mg Rectal Q4H PRN Lavon Paganini Angiulli, PA-C      . albuterol (PROVENTIL) (2.5 MG/3ML) 0.083% nebulizer solution 2.5 mg  2.5 mg Nebulization Q6H PRN Lavon Paganini Angiulli, PA-C   2.5 mg at 07/09/15 0033  . ALPRAZolam Duanne Moron) tablet 0.25 mg  0.25 mg Oral TID PRN Lavon Paganini Angiulli, PA-C   0.25 mg at 07/04/15 2206  . amiodarone (PACERONE) tablet 200 mg  200 mg Oral Daily Lavon Paganini Angiulli, PA-C   200 mg at 07/09/15 6387  . antiseptic oral rinse (CPC / CETYLPYRIDINIUM CHLORIDE 0.05%) solution 7 mL  7 mL Mouth Rinse BID Charlett Blake, MD   7 mL at 07/09/15 0800  . atorvastatin (LIPITOR) tablet 40 mg  40 mg Oral q1800 Lavon Paganini Angiulli, PA-C   40 mg at 07/08/15 1731  . diclofenac sodium (VOLTAREN) 1 % transdermal gel 2 g  2 g Topical QID Lavon Paganini Angiulli, PA-C   2 g at 07/09/15 5643  . diltiazem (CARDIZEM) tablet 60 mg  60 mg Oral BID Lavon Paganini Angiulli, PA-C   60 mg at 07/09/15 0905  . diphenoxylate-atropine (LOMOTIL) 2.5-0.025 MG per tablet 1-2 tablet  1-2 tablet Oral QID PRN Evie Lacks Plotnikov, MD      . feeding supplement (ENSURE ENLIVE) (ENSURE ENLIVE) liquid 237 mL  237 mL Oral TID PRN Charlett Blake, MD      . food thickener (THICK IT) powder   Oral PRN Lavon Paganini  Angiulli, PA-C      . guaiFENesin-dextromethorphan (ROBITUSSIN DM) 100-10 MG/5ML syrup 5 mL  5 mL Oral Q4H PRN Charlett Blake, MD   5 mL at 07/08/15 2110  . insulin aspart (novoLOG) injection 0-24 Units  0-24 Units Subcutaneous TID AC & HS Bary Leriche, PA-C   2 Units at 07/09/15 3295  . iron polysaccharides (NIFEREX) capsule 150 mg  150 mg Oral Daily Charlett Blake, MD   150 mg at 07/09/15 1884  . lactose free nutrition (BOOST PLUS) liquid 237 mL  237 mL Oral TID WC Charlett Blake, MD   237 mL at 07/08/15 1318  . levETIRAcetam (KEPPRA) 1,000 mg in sodium chloride 0.9 % 100 mL IVPB  1,000 mg Intravenous Q12H Evie Lacks Plotnikov, MD   1,000 mg at 07/09/15 1056  . metFORMIN (GLUCOPHAGE) tablet 1,000 mg  1,000 mg Oral Q breakfast Lavon Paganini Angiulli, PA-C   1,000 mg at 07/09/15 1660  . metFORMIN (GLUCOPHAGE) tablet 500 mg  500 mg Oral Q supper Lavon Paganini Angiulli, PA-C   500 mg at 07/08/15 1731  . MUSCLE RUB CREA   Topical PRN Charlett Blake, MD      . ondansetron [facility]) tablet 4 mg  4 mg Oral Q6H PRN Lavon Paganini Angiulli, PA-C       Or  . ondansetron [facility]) injection 4 mg  4 mg Intravenous Q6H PRN Lavon Paganini Angiulli, PA-C      . oxyCODONE (Oxy IR/ROXICODONE) immediate release tablet 5 mg  5 mg Oral Q6H PRN Lavon Paganini Angiulli, PA-C   5 mg at 07/09/15 6301  . pantoprazole sodium (PROTONIX) 40 mg/20  mL oral suspension 40 mg  40 mg Oral Daily Charlett Blake, MD   40 mg at 07/09/15 0905  . [place]   Oral PRN Charlett Blake, MD      . Rivaroxaban Alveda Reasons) tablet 15 mg  15 mg Oral Q supper Leodis Sias, RPH   15 mg at 07/08/15 1731  . sodium chloride flush (NS) 0.9 % injection 10-40 mL  10-40 mL Intracatheter PRN Charlett Blake, MD   10 mL at 07/09/15 0457  . vitamin B-12 (CYANOCOBALAMIN) tablet 1,000 mcg  1,000 mcg Oral Daily Reyne Dumas, MD   1,000 mcg at 07/09/15 0904   Facility-Administered Medications Ordered in Other Encounters  Medication Dose Route  Frequency Provider Last Rate Last Dose  . 0.9 %  sodium chloride infusion   Intravenous Continuous Forest Gleason, MD   Stopped at 12/11/14 1034  . lidocaine-prilocaine (EMLA) cream   Topical Once Evlyn Kanner, NP      . sodium chloride 0.9 % injection 10 mL  10 mL Intracatheter PRN Forest Gleason, MD   10 mL at 06/03/14 1106  . sodium chloride 0.9 % injection 10 mL  10 mL Intravenous PRN Forest Gleason, MD   10 mL at 07/30/14 0920  . sodium chloride 0.9 % injection 10 mL  10 mL Intracatheter PRN Forest Gleason, MD   10 mL at 12/11/14 0912  . sodium chloride flush (NS) 0.9 % injection 10 mL  10 mL Intravenous PRN Forest Gleason, MD   10 mL at 05/05/15 0848    REVIEW OF SYSTEMS:  On review of systems, is unable to be completed by the patient. His wife reports he continues to have a nonproductive cough which has not gone away since his diagnosis and was the initial symptom he developed which led to his diagnosis. She reports that he has been very sleepy and unable to participate in much conversation, and he recognizes this and is tearful when trying to engage in conversation.     PHYSICAL EXAM:  weight is 202 lb 6.1 oz (91.8 kg). His oral temperature is 98 F (36.7 C). His blood pressure is 115/58 and his pulse is 94. His respiration is 19 and oxygen saturation is 99%.    In general this is a chronically ill male in no acute distress. He is alert and oriented x4 to self and his wife.  Skin is intact without any evidence of gross lesions. Cardiovascular exam reveals a regular rate and rhythm, no clicks rubs or murmurs are auscultated. Cardiopulmonary assessment is negative for acute distress and he exhibits normal effort.    KPS = 30  100 - Normal; no complaints; no evidence of disease. 90   - Able to carry on normal activity; minor signs or symptoms of disease. 80   - Normal activity with effort; some signs or symptoms of disease. 56   - Cares for self; unable to carry on normal activity or to do  active work. 60   - Requires occasional assistance, but is able to care for most of his personal needs. 50   - Requires considerable assistance and frequent medical care. 39   - Disabled; requires special care and assistance. 61   - Severely disabled; hospital admission is indicated although death not imminent. 54   - Very sick; hospital admission necessary; active supportive treatment necessary. 10   - Moribund; fatal processes progressing rapidly. 0     - Dead  Karnofsky DA, Abelmann  Dozier, Craver LS and Burchenal JH [phone]) The use of the nitrogen mustards in the palliative treatment of carcinoma: with particular reference to bronchogenic carcinoma Cancer 1 634-56  LABORATORY DATA:  Lab Results  Component Value Date   WBC 6.3 07/09/2015   HGB 9.1* 07/09/2015   HCT 29.4* 07/09/2015   MCV 88.0 07/09/2015   PLT 216 07/09/2015   Lab Results  Component Value Date   NA 135 07/09/2015   K 4.5 07/09/2015   CL 103 07/09/2015   CO2 22 07/09/2015   Lab Results  Component Value Date   ALT 16* 07/07/2015   AST 20 07/07/2015   ALKPHOS 102 07/07/2015   BILITOT 0.5 07/07/2015     RADIOGRAPHY: Ct Abdomen Pelvis Wo Contrast  07/04/2015  CLINICAL DATA:  Anemia, history coronary disease post MI, diabetes mellitus, former smoker, stage III chronic kidney disease, LEFT lung cancer, atrial fibrillation EXAM: CT ABDOMEN AND PELVIS WITHOUT CONTRAST TECHNIQUE: Multidetector CT imaging of the abdomen and pelvis was performed following the standard protocol without IV contrast. Sagittal and coronal MPR images reconstructed from axial data set. No oral contrast was administered. COMPARISON:  None. CT from PET-CT exam 05/03/2015 FINDINGS: Lower chest:  Bibasilar atelectasis. Hepatobiliary: Cholelithiasis. Beam hardening artifacts from patient's arms traverse liver, no gross hepatic abnormality seen. Pancreas: Atrophic, otherwise unremarkable Spleen: Traversing artifacts, grossly normal appearance  Adrenals/Urinary Tract: Normal appearing adrenal glands. Cyst at inferior pole LEFT kidney again identified 3.4 x 3.6 cm image 49. Kidneys otherwise unremarkable without additional mass or hydronephrosis. Single nondilated ureters bilaterally. Normal appearing well distended urinary bladder. Stomach/Bowel: Normal appendix. Retained dense contrast within colon from recent swallowing study. Stomach and bowel loops otherwise normal appearance for technique. Vascular/Lymphatic: Atherosclerotic calcifications aorta, iliac, and femoral arteries. Few coronary arterial calcifications. Low-attenuation of circulating blood consistent with history of anemia. No adenopathy. Reproductive: N/A Other: Small BILATERAL inguinal hernias containing fat. No free air or free fluid. Musculoskeletal: Degenerative disc disease changes L4-L5. No acute osseous findings. IMPRESSION: LEFT renal cyst. Cholelithiasis. Diffuse colonic diverticulosis without gross evidence acute diverticulitis. Small BILATERAL inguinal hernias containing fat. Aortic atherosclerosis. Electronically Signed   By: Lavonia Dana M.D.   On: 07/04/2015 12:55   Dg Chest 1 View  07/08/2015  CLINICAL DATA:  Altered mental status for 1 day. History of lung carcinoma EXAM: CHEST 1 VIEW COMPARISON:  Chest radiograph July 06, 2015; PET-CT May 03, 2015 FINDINGS: There is again noted opacity in the left mid lung region extending to the left hilum, likely due to mass with postobstructive pneumonitis. Right lung clear. Heart is upper normal in size with pulmonary vascularity within normal limits. Calcification noted in the aortic arch. Port-A-Cath tip in superior vena cava. No pneumothorax. No bone lesions evident. IMPRESSION: Stable left midlung opacity, likely due to mass with postobstructive pneumonitis. Lungs elsewhere clear. No new opacity. Stable cardiac silhouette. Aortic atherosclerosis. Electronically Signed   By: Lowella Grip III M.D.   On: 07/08/2015 19:57   Dg  Chest 2 View  07/02/2015  CLINICAL DATA:  Persistent cough, COPD, lung malignancy. Patient cyst has sustained an intracranial hemorrhage with right hemi [place] is EXAM: CHEST  2 VIEW COMPARISON:  Chest x-ray of June 26, 2015 FINDINGS: The right lung is adequately inflated and exhibits increased density in the infrahilar region. There is some overlap here of the Port-A-Cath reservoir. On the left there is persistent increased density in the perihilar region extending toward the lateral pleural surface. The heart is top-normal in size.  There are post CABG changes. Three upper sternal wires are chronically broken. There is calcification in the wall of the aortic arch. There is no pleural effusion or pneumothorax. The Port-A-Cath appliance tip projects over the midportion of the SVC. IMPRESSION: 1. Subtle increased density at the right lung base suggests subsegmental atelectasis. Stable parenchymal density in the left mid lung and left perihilar region. 2. Stable post CABG changes.  No evidence of CHF. Electronically Signed   By: David  Martinique M.D.   On: 07/02/2015 09:40   Dg Chest 2 View  06/26/2015  CLINICAL DATA:  C/o chronic cough. 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 Rales heard in right lower lung. 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 EXAM: CHEST  2 VIEW COMPARISON:  06/25/2015 FINDINGS: There stable changes from prior CABG surgery. Cardiac silhouette is mildly enlarged. Focal irregular opacity in the posterior perihilar region of the left lung is stable. There are no new areas of lung opacification. No pleural effusion or pneumothorax. Right anterior chest wall Port-A-Cath is stable. Bony thorax is intact. IMPRESSION: 1. No acute cardiopulmonary disease. 2. No change from the prior study. Persistent focal opacity in the perihilar region of the left lung consistent with the reported squamous cell lung carcinoma. Electronically Signed   By: Lajean Manes M.D.    On: 06/26/2015 16:36   Dg Chest 2 View  06/25/2015  CLINICAL DATA:  Cough, left-sided weakness EXAM: CHEST  2 VIEW COMPARISON:  06/20/2015 FINDINGS: Cardiomediastinal silhouette is stable. Right IJ Port-A-Cath is unchanged in position. Persistent left upper lobe consolidation. Bony thorax is unremarkable. IMPRESSION: No significant change. Persistent left upper lobe consolidation. Stable right IJ Port-A-Cath position. Electronically Signed   By: Lahoma Crocker M.D.   On: 06/25/2015 16:54   Ct Head Wo Contrast  06/26/2015  CLINICAL DATA:  Slurred speech. EXAM: CT HEAD WITHOUT CONTRAST TECHNIQUE: Contiguous axial images were obtained from the base of the skull through the vertex without intravenous contrast. COMPARISON:  MRI of June 14, 2015.  CT scan of June 13, 2015. FINDINGS: Bony calvarium appears intact.  Sphenoid sinusitis is noted. Mild diffuse cortical atrophy is noted. There is continued presence of left superior parietal intraparenchymal hemorrhage which now measures 21 x 11 mm and is slightly decreased in size compared to prior exam. Subarachnoid hemorrhage has resolved. There is continued surrounding vasogenic edema. No significant midline shift is noted. Ventricular size is unremarkable. IMPRESSION: Mild diffuse cortical atrophy. Continued presence of left parietal intraparenchymal hemorrhage with surrounding vasogenic edema. This is improved compared to prior exam. Subarachnoid hemorrhage is no longer present. Electronically Signed   By: Marijo Conception, M.D.   On: 06/26/2015 14:29   Ct Head Wo Contrast  06/13/2015  CLINICAL DATA:  The woke at 5 a.m. with right-sided weakness. EXAM: CT HEAD WITHOUT CONTRAST TECHNIQUE: Contiguous axial images were obtained from the base of the skull through the vertex without intravenous contrast. COMPARISON:  None. FINDINGS: There is intra cerebral hemorrhage noted in the posterior left frontal lobe. Subarachnoid extension. Hemorrhage measures 2.4 x 2.2 x 1.6 cm for a  volume of 4.2 mL. Mild surrounding vasogenic edema. No significant mass effect. No midline shift. No hydrocephalus. No acute calvarial abnormality. Air-fluid level in the left sphenoid sinus. Mastoid air cells are clear. IMPRESSION: Left posterior frontal intra cerebral hemorrhage with subarachnoid extension as described above. Critical Value/emergent results were called by telephone at the time of interpretation on 06/13/2015 at 10:02  am to Dr. Loura Pardon , who verbally acknowledged these results. Electronically Signed   By: Rolm Baptise M.D.   On: 06/13/2015 10:04   Mr Jodene Nam Head Wo Contrast  06/15/2015  ADDENDUM REPORT: 06/15/2015 08:11 ADDENDUM: With the rounded contour of the enhancing component of the left frontal lobe hematoma, follow-up until clearance recommended to exclude underlying vascular abnormality. Electronically Signed   By: Genia Del M.D.   On: 06/15/2015 08:11  06/15/2015  CLINICAL DATA:  75 year old diabetic male with history of lung cancer. Right-sided weakness. Subsequent encounter. EXAM: MRI HEAD WITHOUT CONTRAST MRA HEAD WITHOUT CONTRAST TECHNIQUE: Multiplanar, multiecho pulse sequences of the brain and surrounding structures were obtained without intravenous contrast. Angiographic images of the head were obtained using MRA technique without contrast. COMPARISON:  06/13/2015 head CT.  07/28/2014 brain MR. FINDINGS: MRI HEAD FINDINGS Posterior left frontal lobe complex 2.5 x 2.2 x 2 cm hemorrhagic lesion with marked surrounding vasogenic edema with breakthrough into sulci with subarachnoid hemorrhage noted. Patient's history of lung cancer in addition to 6 mm rounded area of enhanced along the posterior margin of this hemorrhage suggests that this is most likely is related to a hemorrhagic metastatic lesion which has bled. Continued MR surveillance as hemorrhage clears is recommended. There may be a a second enhancing lesion within the anterior left parietal lobe with surrounding vasogenic  edema. Venous infarct can have a similar appearance. The major dural sinuses appear patent. No acute thrombotic infarct separate from the above described findings. Remote small left parietal lobe, left frontal lobe, posterior left opercular and tiny right thalamic infarct. Mild small vessel disease changes. Moderate global atrophy without hydrocephalus. Opacification left sphenoid sinus with air-fluid level suggesting acute sinusitis. Mild mucosal thickening ethmoid sinus air cells and minimal mucosal thickening frontal sinuses. Bilateral mastoid air cell and middle ear opacification greater on the left without obstructing lesion of the eustachian tube noted. Post lens replacement without acute orbital abnormality noted. MRA HEAD FINDINGS MR angiogram does not incorporate the left frontal lobe hemorrhage. Mild narrowing supraclinoid aspect internal carotid artery bilaterally with irregularity more notable on the left. Small infundibulum on the left posterior communicating artery level. Anterior circulation without medium or large size vessel significant stenosis or occlusion. Middle cerebral artery branch vessel irregularity bilaterally. Right vertebral artery ends in a posterior inferior cerebellar artery distribution. No significant narrowing left vertebral artery. Moderate narrowing portions of the left posterior inferior cerebellar artery. Ectatic basilar artery without high-grade stenosis. Nonvisualized anterior inferior cerebellar arteries. Small left superior cerebellar artery. Posterior cerebral artery distal branch vessel narrowing bilaterally. IMPRESSION: MRI HEAD Posterior left frontal lobe complex 2.5 x 2.2 x 2 cm hemorrhagic lesion with marked surrounding vasogenic edema with breakthrough into sulci with subarachnoid hemorrhage noted. Patient's history of lung cancer in addition to 6 mm rounded area of enhanced along the posterior margin of this hemorrhage suggests that this is most likely is related to  a hemorrhagic metastatic lesion which has bled. Continued MR surveillance as hemorrhage clears is recommended. There may be a a second enhancing lesion within the anterior left parietal lobe with surrounding vasogenic edema. Venous infarct can have a similar appearance. The major dural sinuses appear patent. Opacification left sphenoid sinus with air-fluid level suggesting acute sinusitis. Bilateral mastoid air cell and middle ear opacification greater on the left without obstructing lesion of the eustachian tube noted. MRA HEAD MR angiogram does not incorporate the left frontal lobe hemorrhage. Mild narrowing supraclinoid aspect internal carotid artery bilaterally with irregularity  more notable on the left. Small infundibulum on the left posterior communicating artery level. Anterior circulation without medium or large size vessel significant stenosis or occlusion. Middle cerebral artery branch vessel irregularity bilaterally. Right vertebral artery ends in a posterior inferior cerebellar artery distribution. No significant narrowing left vertebral artery. Moderate narrowing portions of the left posterior inferior cerebellar artery. Ectatic basilar artery without high-grade stenosis. Nonvisualized anterior inferior cerebellar arteries. Small left superior cerebellar artery. Posterior cerebral artery distal branch vessel narrowing bilaterally. Electronically Signed: By: Genia Del M.D. On: 06/14/2015 19:34   [name] GL Contrast  06/26/2015  CLINICAL DATA:  Initial evaluation for progressively garbled and slurred speech, with increased right-sided facial droop. EXAM: MRI HEAD WITHOUT AND WITH CONTRAST TECHNIQUE: Multiplanar, multiecho pulse sequences of the brain and surrounding structures were obtained without and with intravenous contrast. CONTRAST:  23m MULTIHANCE GADOBENATE DIMEGLUMINE 529 MG/ML IV SOLN COMPARISON:  Prior CT from earlier the same day as well as previous MRI from 06/14/2015. FINDINGS:  Generalized cerebral atrophy with minimal chronic small vessel ischemic changes again noted, stable. Small remote lacunar infarct within the right thalamus. Previously identified posterior left frontal lobe complex hemorrhagic lesion again seen, overall likely decreased in size from previous. Lesion measures 13 x 21 x 16 mm on T2 weighted sequences. Surrounding vasogenic edema is similar. Breakthrough subarachnoid hemorrhage within the adjacent cortical sulci again seen, improved. There is increased peripheral enhancement about the margin of the hemorrhagic lesion, likely related to the evolving hematoma. Previously noted small hemorrhagic nodule is not as clearly delineated on this exam. Note again made of a second possible enhancing lesion within the anterior left parietal lobe along the dura, measuring approximately 9 x 11 x 11 mm. Surrounding vasogenic edema. No other new enhancing lesions. Immediately lateral and inferior to the dominant hemorrhagic lesion. There is a focus of abnormal restricted diffusion involving the cortical gray matter and underlying white matter of the posterior left frontal region, compatible with acute ischemic infarct (series 4, image 35). Associated signal loss on ADC map. This appears to be more vascular distribution rather than seizure related. Additional small sub cm focus of restricted diffusion more posteriorly within the left centrum semi ovale. No significant mass effect. Area of infarction closely approximates the hemorrhagic lesion can subarachnoid hemorrhage, but does not appear to be hemorrhagic in of itself. No other acute infarct. Gray-white matter differentiation otherwise maintained. Major intracranial vascular flow voids are preserved. No midline shift. No hydrocephalus. No extra-axial fluid collection. Again, major dural sinuses appear to be grossly patent. Craniocervical junction within normal limits. Visualized upper cervical spine unremarkable. Pituitary gland  within normal limits. No acute abnormality about the globes and orbits. Patient is status post lens extraction bilaterally. Mucosal thickening with fluid level within the left sphenoid sinus, similar to prior. Mild scattered opacity within the anterior ethmoidal air cells. Bilateral mastoid effusions present. Visualize bone marrow within normal limits. Scalp soft tissues demonstrate no acute abnormality. IMPRESSION: 1. Acute ischemic left MCA territory infarct adjacent to the posterior left frontal hemorrhagic lesion. Vasospasm as an underlying etiology is suspected given the adjacent complex hemorrhagic lesion and subarachnoid hemorrhage. 2. Continued interval evolution of complex left frontal lobe hemorrhagic lesion with similar vasogenic edema. Associated breakthrough subarachnoid hemorrhage is improved. This lesion now demonstrates peripheral enhancement, which would be expected with an evolving hematoma. Previously noted small nodular focus of enhancement not well delineated on this exam. Again, continued MR surveillance at the hemorrhage clears is recommended to ensure  no underlying lesion is present. 3. Additional 9 x 11 x 11 mm enhancing lesion within the anterior left parietal lobe, similar to prior. Attention at follow-up recommended. 4. Acute left sphenoid sinus disease with bilateral mastoid effusions, similar to prior. Electronically Signed   By: Jeannine Boga M.D.   On: 06/26/2015 23:30   [name] XA Contrast  06/15/2015  ADDENDUM REPORT: 06/15/2015 08:11 ADDENDUM: With the rounded contour of the enhancing component of the left frontal lobe hematoma, follow-up until clearance recommended to exclude underlying vascular abnormality. Electronically Signed   By: Genia Del M.D.   On: 06/15/2015 08:11  06/15/2015  CLINICAL DATA:  75 year old diabetic male with history of lung cancer. Right-sided weakness. Subsequent encounter. EXAM: MRI HEAD WITHOUT CONTRAST MRA HEAD WITHOUT CONTRAST  TECHNIQUE: Multiplanar, multiecho pulse sequences of the brain and surrounding structures were obtained without intravenous contrast. Angiographic images of the head were obtained using MRA technique without contrast. COMPARISON:  06/13/2015 head CT.  07/28/2014 brain MR. FINDINGS: MRI HEAD FINDINGS Posterior left frontal lobe complex 2.5 x 2.2 x 2 cm hemorrhagic lesion with marked surrounding vasogenic edema with breakthrough into sulci with subarachnoid hemorrhage noted. Patient's history of lung cancer in addition to 6 mm rounded area of enhanced along the posterior margin of this hemorrhage suggests that this is most likely is related to a hemorrhagic metastatic lesion which has bled. Continued MR surveillance as hemorrhage clears is recommended. There may be a a second enhancing lesion within the anterior left parietal lobe with surrounding vasogenic edema. Venous infarct can have a similar appearance. The major dural sinuses appear patent. No acute thrombotic infarct separate from the above described findings. Remote small left parietal lobe, left frontal lobe, posterior left opercular and tiny right thalamic infarct. Mild small vessel disease changes. Moderate global atrophy without hydrocephalus. Opacification left sphenoid sinus with air-fluid level suggesting acute sinusitis. Mild mucosal thickening ethmoid sinus air cells and minimal mucosal thickening frontal sinuses. Bilateral mastoid air cell and middle ear opacification greater on the left without obstructing lesion of the eustachian tube noted. Post lens replacement without acute orbital abnormality noted. MRA HEAD FINDINGS MR angiogram does not incorporate the left frontal lobe hemorrhage. Mild narrowing supraclinoid aspect internal carotid artery bilaterally with irregularity more notable on the left. Small infundibulum on the left posterior communicating artery level. Anterior circulation without medium or large size vessel significant stenosis or  occlusion. Middle cerebral artery branch vessel irregularity bilaterally. Right vertebral artery ends in a posterior inferior cerebellar artery distribution. No significant narrowing left vertebral artery. Moderate narrowing portions of the left posterior inferior cerebellar artery. Ectatic basilar artery without high-grade stenosis. Nonvisualized anterior inferior cerebellar arteries. Small left superior cerebellar artery. Posterior cerebral artery distal branch vessel narrowing bilaterally. IMPRESSION: MRI HEAD Posterior left frontal lobe complex 2.5 x 2.2 x 2 cm hemorrhagic lesion with marked surrounding vasogenic edema with breakthrough into sulci with subarachnoid hemorrhage noted. Patient's history of lung cancer in addition to 6 mm rounded area of enhanced along the posterior margin of this hemorrhage suggests that this is most likely is related to a hemorrhagic metastatic lesion which has bled. Continued MR surveillance as hemorrhage clears is recommended. There may be a a second enhancing lesion within the anterior left parietal lobe with surrounding vasogenic edema. Venous infarct can have a similar appearance. The major dural sinuses appear patent. Opacification left sphenoid sinus with air-fluid level suggesting acute sinusitis. Bilateral mastoid air cell and middle ear opacification greater on the left  without obstructing lesion of the eustachian tube noted. MRA HEAD MR angiogram does not incorporate the left frontal lobe hemorrhage. Mild narrowing supraclinoid aspect internal carotid artery bilaterally with irregularity more notable on the left. Small infundibulum on the left posterior communicating artery level. Anterior circulation without medium or large size vessel significant stenosis or occlusion. Middle cerebral artery branch vessel irregularity bilaterally. Right vertebral artery ends in a posterior inferior cerebellar artery distribution. No significant narrowing left vertebral artery.  Moderate narrowing portions of the left posterior inferior cerebellar artery. Ectatic basilar artery without high-grade stenosis. Nonvisualized anterior inferior cerebellar arteries. Small left superior cerebellar artery. Posterior cerebral artery distal branch vessel narrowing bilaterally. Electronically Signed: By: Genia Del M.D. On: 06/14/2015 19:34   Dg Chest Port 1 View  07/06/2015  CLINICAL DATA:  COUGH.HX MI,CA EXAM: PORTABLE CHEST 1 VIEW COMPARISON:  07/02/2015 FINDINGS: Heart size is enlarged. Right-sided Port-A-Cath tip to the level of the lower superior vena cava. There is patchy density in the left upper lobe, stable compared with prior study. Mild interstitial edema is noted. IMPRESSION: 1. Persistent left upper lobe density. 2. Mild interstitial edema. Electronically Signed   By: Nolon Nations M.D.   On: 07/06/2015 14:09   Dg Chest Port 1 View  06/30/2015  CLINICAL DATA:  Cough and congestion. EXAM: PORTABLE CHEST 1 VIEW COMPARISON:  June 18, 2015 FINDINGS: There is persistent airspace consolidation in the left upper lobe with volume loss. Lungs elsewhere clear. Heart is upper normal in size with pulmonary vascularity within normal limits. Port-A-Cath tip is in the superior vena cava near the cavoatrial junction, stable. No pneumothorax. No adenopathy evident. Patient is status post coronary artery bypass grafting. IMPRESSION: Persistent left upper lobe airspace consolidation with volume loss. No change from 3 days prior. Stable cardiac silhouette. Electronically Signed   By: Lowella Grip III M.D.   On: 07/09/2015 07:25   Dg Chest Port 1 View  06/18/2015  CLINICAL DATA:  Cough EXAM: PORTABLE CHEST 1 VIEW COMPARISON:  06/16/2015 FINDINGS: Stable right jugular Port-A-Cath. Low volumes with bibasilar atelectasis. Left mid lung dense pulmonary opacity is stable. No pneumothorax. IMPRESSION: Stable left mid lung dense pulmonary opacity. Bibasilar atelectasis. Electronically Signed   By:  Marybelle Killings M.D.   On: 06/18/2015 15:16   Dg Chest Port 1 View  06/16/2015  CLINICAL DATA:  75 year old male with lung cancer. No chest complaint at this time. EXAM: PORTABLE CHEST 1 VIEW COMPARISON:  Chest radiograph dated 06/15/2015 FINDINGS: There has been interval removal of the endotracheal and enteric tube. The stable area of density is again noted in the left mid lung field. Multiple surgical clips arms in this area. No new consolidation identified. Overall there is improved aeration of the left lower lung field since the prior study. The right lung is clear. There is no pleural effusion or pneumothorax. Stable cardiac silhouette. Median sternotomy wires and CABG vascular clips noted. Right pectoral Port-A-Cath with tip in stable positioning. No acute osseous pathology. IMPRESSION: Interval removal of the endotracheal and enteric tube. Stable appearing density in the left mid lung field with overall improvement of the aeration of the left lung. Electronically Signed   By: Anner Crete M.D.   On: 06/16/2015 19:54   Dg Chest Port 1 View  06/15/2015  CLINICAL DATA:  Acute respiratory failure EXAM: PORTABLE CHEST 1 VIEW COMPARISON:  Portable chest x-ray of 06/14/2015 FINDINGS: Aeration of the lungs has improved somewhat. The tip of the endotracheal tube appears to be  approximately 3.8 cm above the carina. Opacity in the left perihilar and left mid lung is unchanged. Cardiomegaly is stable. Right sided Port-A-Cath is unchanged in position, and right IJ central venous line is unchanged. IMPRESSION: Slightly better aeration. Endotracheal tube tip 3.8 cm above the carina. Electronically Signed   By: Ivar Drape M.D.   On: 06/15/2015 08:05   Dg Chest Port 1 View  06/14/2015  CLINICAL DATA:  Respiratory failure, history of coronary artery disease, GERD, diabetes, lung cancer EXAM: PORTABLE CHEST 1 VIEW COMPARISON:  06/13/2015 FINDINGS: Cardiomediastinal silhouette is stable. Status post median sternotomy.  Persistent opacity left hilum and left perihilar region. Post radiation fibrosis left upper lobe again noted. Left basilar atelectasis. Stable endotracheal and NG tube position. Right IJ Port-A-Cath is unchanged in position. Stable right IJ central line. No pneumothorax. No pulmonary edema. IMPRESSION: Stable support apparatus. Persistent opacity left hilum and left perihilar region. Postradiation fibrosis in left upper lobe again noted. Left basilar atelectasis. No pulmonary edema. No pneumothorax. Status post median sternotomy. Electronically Signed   By: Lahoma Crocker M.D.   On: 06/14/2015 08:15   Dg Chest Port 1 View  06/13/2015  CLINICAL DATA:  75 year old male with endotracheal tube placement. EXAM: PORTABLE CHEST 1 VIEW COMPARISON:  Radiograph dated 06/13/2015 FINDINGS: Endotracheal tube with tip approximately 4 cm above the carina. An enteric tube is partially visualized coursing the left hemi abdomen with tip beyond the inferior margin of the image. There is a right IJ central line with tip over the mediastinum. Right pectoral infusion catheter is also noted. There is persistent opacity in the left mid lung field. Multiple surgical clips in the left lower lung field similar to prior study. There is no pneumothorax. The right costophrenic angle has been excluded from the image. The cardiac border is silhouetted. No acute osseous pathology identified. IMPRESSION: Endotracheal tube above the carina. Stable appearing postsurgical changes and persistent opacity in the left mid lung field. Electronically Signed   By: Anner Crete M.D.   On: 06/13/2015 21:05   Dg Chest Port 1 View  06/13/2015  CLINICAL DATA:  Respiratory failure and cough. History of left lung squamous carcinoma. EXAM: PORTABLE CHEST 1 VIEW COMPARISON:  05/27/2015 and prior radiographs.  05/03/2015 PET CT FINDINGS: Cardiomegaly, CABG changes and right Port-A-Cath with tip overlying the lower SVC again noted. Left mid lung opacity is stable to  slightly increased in size There is no evidence of pneumothorax or pleural effusion. No other changes identified. IMPRESSION: Stable to slightly increased left lung opacity which may represent posttreatment changes. No other significant change. Electronically Signed   By: Margarette Canada M.D.   On: 06/13/2015 18:10   Dg Abd Portable 1v  06/13/2015  CLINICAL DATA:  Orogastric tube placement EXAM: PORTABLE ABDOMEN - 1 VIEW COMPARISON:  PET-CT May 03, 2015 FINDINGS: Orogastric tube tip and side port are in the stomach. Bowel gas pattern is unremarkable. No bowel dilatation or air-fluid level suggesting obstruction. No free air. Moderate stool is noted in the colon. IMPRESSION: Orogastric tube tip and side port in stomach. Bowel gas pattern unremarkable. No free air evident. Electronically Signed   By: Lowella Grip III M.D.   On: 06/13/2015 20:05   Dg Swallowing Func-speech Pathology  06/29/2015  Objective Swallowing Evaluation: Type of Study: MBS-Modified Barium Swallow Study Patient Details Name: AMIEL MCCAFFREY MRN: [id number] Date of Birth: 11-29-1940 Today's Date: 06/29/2015 Time: SLP Start Time (ACUTE ONLY): 0910-SLP Stop Time (ACUTE ONLY): 0930 SLP Time  Calculation (min) (ACUTE ONLY): 20 min Past Medical History: Past Medical History Diagnosis Date . Coronary artery disease  . Pneumonia 2000 . Arthritis  . 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 Bruises easily  . GERD (gastroesophageal reflux disease)    takes Omeprazole daily as needed for stomach pain . Blood transfusion 2001 . Diabetes mellitus    takes Metformin daily; . Impaired hearing    bil hearing aide . Macular degeneration    being watched for this but hasn't been "completely" diagnosed . Myocardial infarction ([place]) 2001 . Cancer [facility]  Past Surgical History: Past Surgical History Procedure Laterality Date . Coronary artery bypass graft  2001   4 vessels . Cardiac catheterization  2001 . Tonsillectomy     as a child . Colonoscopy   .  Cataract extraction     bilateral . Lumbar laminectomy/decompression microdiscectomy  12/19/2010   Procedure: LUMBAR LAMINECTOMY/DECOMPRESSION MICRODISCECTOMY;  Surgeon: Elaina Hoops;  Location: [facility];  Service: Neurosurgery;  Laterality: N/A;  Lumbar three-four, four-five decompressive lumbar laminectomy . Joint replacement  right tkr . Eye surgery     cataracts . Back surgery     l4 5 . Portacath placement N/A 05/20/2014   Procedure: INSERTION PORT-A-CATH;  Surgeon: Nestor Lewandowsky, MD;  Location: [facility] ORS;  Service: General;  Laterality: N/A; HPI: 74 y.o. male patient with PMH: GERD, pna, CAD, DM, MI, cancer and chronic admitted with right-sided hemi-plegia. CT of the head revealed left frontal intracerebral hemorrhage, with subarachnoid hemorrhage as well adjacent to it. Intubated approximately 24-30 hours (extubated 6/6). 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  Objective swallow study ordered to determine safest diet consistencies due to change in function.  No Data Recorded Assessment / Plan / Recommendation CHL IP CLINICAL IMPRESSIONS 06/29/2015 Therapy Diagnosis Moderate oral dysphagia, Moderate pharyngeal dysphagia  Clinical Impression Pt presents with a moderate oropharyngeal dysphagia with both sensory and motor components.  As mentioned on bedside swallow evaluation, pt presents with right sided labial, lingual, and buccal weakness which impacts his ability to contain and transit boluses effectively.  This in combination with decreased pharyngeal sensation result in premature spillage of materials into the pharynx with swallow response most consistently triggered at the pyriforms.  Delay in swallow response resulted in aspiration of thin liquids with delayed sensation.  Congested cough which occurred several seconds after aspiration event was ineffective for clearing aspirates from the airway.  Deep penetration was noted x1 over multiple boluses  of nectar thick liquids which pt was able to sense and clear from the airway before materials passed below the vocal cords.  Poor base of tongue strength and decreased hyolaryngeal excursion also contributed to moderate pharyngeal residuals post swallow (vallecular residue > pyriforms).  Residue cleared to minimal-mild with cues for extra swallows. Recommend that pt initiate a Dys 1 diet with nectar thick liquids and full supervision for use of swallowing precautions.   Prognosis for advancement good with SLP intervention for pharyngeal strengthening exercises and management of safe diet progression.   Impact on safety and function Moderate aspiration risk     Prognosis 06/29/2015 Prognosis for Safe Diet Advancement Good Barriers to Reach Goals -- Barriers/Prognosis Comment -- CHL IP DIET RECOMMENDATION 06/29/2015 SLP Diet Recommendations Dysphagia 1 (Puree) solids;Nectar thick liquid Liquid Administration via Cup Medication Administration Crushed with puree Compensations Slow rate;Small sips/bites;Clear throat intermittently;Multiple dry swallows after each bite/sip Postural Changes --  No flowsheet data found.         CHL IP ORAL PHASE 06/29/2015 Oral Phase Impaired Oral - Pudding Teaspoon -- Oral - Pudding Cup -- Oral - Honey Teaspoon -- Oral - Honey Cup -- Oral - Nectar Teaspoon Weak lingual manipulation;Right anterior bolus loss;Reduced posterior propulsion;Lingual/palatal residue;Delayed oral transit;Decreased bolus cohesion;Premature spillage Oral - Nectar Cup Right anterior bolus loss;Weak lingual manipulation;Lingual/palatal residue;Delayed oral transit;Decreased bolus cohesion;Premature spillage;Reduced posterior propulsion Oral - Nectar Straw Weak lingual manipulation;Lingual/palatal residue;Decreased bolus cohesion;Premature spillage Oral - Thin Teaspoon Right anterior bolus loss;Weak lingual manipulation;Reduced posterior propulsion;Lingual/palatal residue;Premature spillage;Delayed oral transit;Decreased  bolus cohesion Oral - Thin Cup Weak lingual manipulation;Premature spillage;Lingual/palatal residue;Reduced posterior propulsion;Right anterior bolus loss;Delayed oral transit;Decreased bolus cohesion Oral - Thin Straw -- Oral - Puree Right anterior bolus loss;Weak lingual manipulation;Reduced posterior propulsion;Lingual/palatal residue;Delayed oral transit;Decreased bolus cohesion;Premature spillage Oral - Mech Soft -- Oral - Regular -- Oral - Multi-Consistency -- Oral - Pill -- Oral Phase - Comment --  CHL IP PHARYNGEAL PHASE 06/29/2015 Pharyngeal Phase Impaired Pharyngeal- Pudding Teaspoon -- Pharyngeal -- Pharyngeal- Pudding Cup -- Pharyngeal -- Pharyngeal- Honey Teaspoon -- Pharyngeal -- Pharyngeal- Honey Cup -- Pharyngeal -- Pharyngeal- Nectar Teaspoon Delayed swallow initiation-vallecula;Reduced anterior laryngeal mobility;Reduced laryngeal elevation;Reduced tongue base retraction;Reduced airway/laryngeal closure;Pharyngeal residue - posterior pharnyx;Pharyngeal residue - valleculae Pharyngeal -- Pharyngeal- Nectar Cup Delayed swallow initiation-pyriform sinuses;Reduced anterior laryngeal mobility;Reduced laryngeal elevation;Reduced airway/laryngeal closure;Reduced tongue base retraction;Pharyngeal residue - valleculae;Pharyngeal residue - posterior pharnyx;Penetration/Aspiration during swallow Pharyngeal Material enters airway, CONTACTS cords and then ejected out Pharyngeal- Nectar Straw Reduced anterior laryngeal mobility;Reduced laryngeal elevation;Reduced airway/laryngeal closure;Reduced tongue base retraction;Pharyngeal residue - valleculae;Pharyngeal residue - posterior pharnyx;Pharyngeal residue - pyriform Pharyngeal -- Pharyngeal- Thin Teaspoon Delayed swallow initiation-vallecula;Reduced anterior laryngeal mobility;Reduced laryngeal elevation;Reduced airway/laryngeal closure;Reduced tongue base retraction;Pharyngeal residue - valleculae;Pharyngeal residue - pyriform;Penetration/Aspiration during  swallow Pharyngeal Material enters airway, CONTACTS cords and then ejected out Pharyngeal- Thin Cup Delayed swallow initiation-pyriform sinuses;Reduced anterior laryngeal mobility;Reduced laryngeal elevation;Reduced airway/laryngeal closure;Reduced tongue base retraction;Penetration/Aspiration during swallow;Pharyngeal residue - valleculae;Pharyngeal residue - pyriform Pharyngeal Material enters airway, passes BELOW cords without attempt by patient to eject out (silent aspiration) Pharyngeal- Thin Straw -- Pharyngeal -- Pharyngeal- Puree Delayed swallow initiation-vallecula;Reduced anterior laryngeal mobility;Reduced laryngeal elevation;Reduced airway/laryngeal closure;Reduced tongue base retraction;Pharyngeal residue - valleculae;Pharyngeal residue - pyriform Pharyngeal -- Pharyngeal- Mechanical Soft -- Pharyngeal -- Pharyngeal- Regular -- Pharyngeal -- Pharyngeal- Multi-consistency -- Pharyngeal -- Pharyngeal- Pill -- Pharyngeal -- Pharyngeal Comment --  No flowsheet data found. No flowsheet data found. 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  PLAN: I spent a very long time with the patient and his wife today speaking mostly with his wife while he napped discussing his diagnosis, previous therapies, and interventions. We discussed the most recent illness which led to this admission and his deficits which have improved some, but in other areas have remained persistent. The patient's wife indicates that she intends to take him home and will have [facility] as well and is hoping to do so next week.   I reviewed the imaging findings with the patient's wife and we discussed that our multidisciplinary brain conference that meets to review patient's cases has met and would recommend a repeat MRI in 1 1/2-2 months from his most recent. 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 She states and understanding but is reticent to believe this at this time also. I encouraged her to discuss this further with her husband if she feels that he can participate in such conversation, because she indicates during our discussion that he has been very involved in making decisions for his care and makes these decisions based on quality of life.   As the patient's wife plans to take him home rather than persue a skilled facility, I suggested that we have palliative care meet with them to discuss goals of care, so that she has as many resources to go home with to try and make this transition as successful as possible, and to have more clinical providers who could oversee and provide orders and assessment for what will be needed for his care through a [facility]. We discussed that palliative care could help the social workers and case management team here in making sure outpatient palliative care is coordinated at time of discharge. She also indicated that she wants his living will documented in his chart which she will bring, and also would like to consider declaring DNR status.   I will have our brain navigator touch base with her to set up the next MRI as an outpatient, and will discuss which options we have for them to review results, once we better understand his ability to present to clinic.   In a visit lasting over 60 minutes, greater than 50% of the time was spend discussing options for her husband's care.   Carola Rhine, PAC

## 2015-07-09 NOTE — Progress Notes (Signed)
Orthopedic Tech Progress Note Patient Details:  Ivan Beasley July 11, 1940 [id number]  Patient ID: Ivan Beasley, male   DOB: [birth year], 75 y.o.   MRN: [id number] Called in advanced brace order; spoke with Ivan Beasley, Ivan Beasley 07/09/2015, 9:49 AM

## 2015-07-09 NOTE — Progress Notes (Signed)
Speech Language Pathology Daily Session Note  Patient Details  Name: Ivan Beasley MRN: [id number] Date of Birth: [birth year]  Today's Date: 07/09/2015 SLP Individual Time: 7622-6333 SLP Individual Time Calculation (min): 41 min  Short Term Goals: Week 3: SLP Short Term Goal 1 (Week 3): Pt will be intelligible in phrases with min assist verbal cues for overarticulation, increased vocal intensity,  SLP Short Term Goal 2 (Week 3): Pt will consume Dys 1 textures and nectar thick liquids with supervision verbal cues for use of swallowing precautions and minimal overt s/s of aspiration.  SLP Short Term Goal 3 (Week 3): Pt will consume therapeutic trials of thin liquids via teaspoon with min assist verbal cues for use of swallowing precautions and minimal overt s/s of aspiration.  SLP Short Term Goal 4 (Week 3): Pt will return demonstration of pharyngeal strengthening exercises for 25 repetitions each with min assist verbal cues.   SLP Short Term Goal 5 (Week 3): Pt will sustain his attention to basic tasks for 5 minute intervals with min verbal cues for redirection.   SLP Short Term Goal 6 (Week 3): Pt will return demonstration of IMST and RMST devices with supervision verbal cues at 9 cm H2O and 15 cm H2O respectively with effort level rated <7/10 over three consecutive sessions.   Skilled Therapeutic Interventions:  Pt was seen for skilled ST targeting goals for dysphagia.  Pt with increased coughing today in comparison to previous therapy session.  This did not appear to correlate with PO intake as coughing episodes did not alter in frequency or intensity when consuming nectar thick liquids via cup.  [name] reports overall good toleration of POs with meals.  SLP reviewed and reinforced pt's known risk of aspiration on currently prescribed diet due to compromised respiratory status resulting from lung cancer diagnosis.  [name] continues to verbalize understanding of risk and remains agreeable  to proceeding with ongoing PO intake.  Pt with improved ability to return demonstration of IMST and EMST devices in comparison to previous therapy sessions and was able to complete 25 repetitions at 9 cm H2O (IMST) and 13 cm H2O (EMST) with mod assist verbal and tactile cues.  Pt reported very little effort during exercises and did not present with overt s/s of distress or discomfort.  Pt left in wheelchair with wife at bedside.  Continue per current plan of care.    Function:  Eating Eating   Modified Consistency Diet: Yes Eating Assist Level: Help managing cup/glass;Supervision or verbal cues           Cognition Comprehension Comprehension assist level: Understands basic 75 - 89% of the time/ requires cueing 10 - 24% of the time  Expression   Expression assist level: Expresses basic 50 - 74% of the time/requires cueing 25 - 49% of the time. Needs to repeat parts of sentences.  Social Interaction Social Interaction assist level: Interacts appropriately 50 - 74% of the time - May be physically or verbally inappropriate.  Problem Solving Problem solving assist level: Solves basic 50 - 74% of the time/requires cueing 25 - 49% of the time  Memory Memory assist level: Recognizes or recalls 50 - 74% of the time/requires cueing 25 - 49% of the time    Pain Pain Assessment Pain Assessment: No/denies pain   Therapy/Group: Individual Therapy  Luisdavid Hamblin, [name] 07/09/2015, 12:38 PM

## 2015-07-09 NOTE — Progress Notes (Signed)
[facility] PROGRESS NOTE  Ivan Beasley QMV:[id number] DOB: 01/04/1941 DOA: 06/28/2015   PCP: Albina Billet, MD     Assessment/Plan: Principal Problem:   ICH (intracerebral hemorrhage) ([place])- L frontal ICH Active Problems:   Mild chronic obstructive pulmonary disease (HCC)   Seizures ([place]), possible   CKD (chronic kidney disease), stage III   Anemia   Right hemiparesis ([place])   Cerebrovascular accident (CVA) due to embolism of left middle cerebral artery (HCC)   Chronic anticoagulation   Malignant neoplasm of left lung (HCC)   Adjustment disorder with mixed anxiety and depressed mood   Mental confusion   Type 2 diabetes mellitus with peripheral neuropathy ([place])   Confusion   Ivan Beasley is a 75 y.o. 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 Consultation requested for anemia   Assessment and plan  - Intermittent confusion (Mainly at night) - UA is non revealing. Repeat CXR has not revealed any new findings. Mild volume depletion noted - Will start IVF N/S 50cc/hr. If confusion persists, will consider CT Head and CT chest (both without contrast) in am. 1. Anemia - H/H remains stable. Patient is back to Xarelto. CT abdomen is negative for retroperitoneal bleed.   2. Recent Right hemiplegia secondary to left MCA infarct adjacent to left frontal ICH with adjacent subarachnoid hemorrhage while on Eliquis for Atrial fib.  - Due to near resolution of ICH as well as has been >2 weeks, and high risk for  recurrent embolic stroke off anticoagulation and relative small size of infarct, neurology Dr. Erlinda Hong  recommended on 6/20, to consider transition back to [place], wife okay with Xarelto, this can be started , continue to monitor hemoglobin  MRI with and without contrast 06/26/15 can not confirm or rule out metastasis at this time. As per neurology Still need to repeat MRI with and without contrast after blood all absorbed.  3. Atrial fib. - sounds PAF., Currently normal sinus rhythm EKG  shows normal sinus rhythm  CHADsVASc score at least 6, very high risk for recurrent strokes, will need to weigh risk/benefits of resuming anticoagulation after r/o bleeding risk. Patient is back to Xarelto. 4. htn - stable. Optimize.  5. Dm 2 w/ neuropathy - Optimize. 6. Cad, sp cabg - in light of cardiac hx, will trx for Hb >8 at this time 7. 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 UTI enterobacter cloacae- Sensitive to ceftriaxone. Completed 7 day course of antibiotics. Repeat UA, urine culture and CXR (brief episode of confusion reported last night) 9.  Chronic nonproductive cough-last chest x-ray was on 6/23 with focal increased density in the right lung base, will repeat chest x-ray, doubt pneumonia, continue incentive spirometry, aspiration precautions    DVT prophylaxsis : On Xarelto  Code Status:  Full code       Family Communication: Discussed in detail with the [name], all imaging results, lab results explained to the patient   Disposition Plan: As per Charlett Blake, MD     Consultants:  [facility]  Procedures:   None  Antibiotics: Anti-infectives    Start  Dose/Rate Route Frequency Ordered Stop   06/28/15 1000  cefTRIAXone (ROCEPHIN) 1 g in dextrose 5 % 50 mL IVPB  Status:  Discontinued     1 g 100 mL/hr over 30 Minutes Intravenous Every 24 hours 06/28/15 0847 07/05/15 0834   06/27/15 2100  vancomycin  (VANCOCIN) 1,500 mg in sodium chloride 0.9 % 500 mL IVPB  Status:  Discontinued     1,500 mg 250 mL/hr over 120 Minutes Intravenous Every 24 hours 06/26/15 2018 06/28/15 0843      HPI/Subjective: Continues to have a nonproductive cough, otherwise stable, afebrile  Objective: Filed Vitals:   07/07/15 1418 07/08/15 0540 07/08/15 1439 07/09/15 0440  BP: 117/53 123/62 110/46 115/58  Pulse: 98 100 94 94  Temp: 98.4 F (36.9 C) 97.6 F (36.4 C) 98 F (36.7 C) 98 F (36.7 C)  TempSrc: Oral Oral Oral Oral  Resp: '18 16 16 19  '$ Weight:    91.8 kg (202 lb 6.1 oz)  SpO2: 94% 92% 97% 99%    Intake/Output Summary (Last 24 hours) at 07/09/15 6812 Last data filed at 07/09/15 0500  Gross per 24 hour  Intake    240 ml  Output    800 ml  Net   -560 ml    Exam:  Examination:  General exam: Appears calm and comfortable  Respiratory system: Clear to auscultation. Respiratory effort normal. Cardiovascular system: S1 & S2 heard, RRR. No JVD, murmurs, rubs, gallops or clicks. No pedal edema. Gastrointestinal system: Abdomen is nondistended, soft and nontender. No organomegaly or masses felt. Normal bowel sounds heard. Central nervous system: Alert and oriented. Right upper extremity weakness Extremities: Symmetric 5 x 5 power. Skin: No rashes, lesions or ulcers Psychiatry: Judgement and insight appear normal. Mood & affect appropriate.     Data Reviewed: I have personally reviewed following labs and imaging studies  Micro Results No results found for this or any previous visit (from the past 240 hour(s)).  Radiology Reports Ct Abdomen Pelvis Wo Contrast  07/04/2015  CLINICAL DATA:  Anemia, history coronary disease post MI, diabetes mellitus, former smoker, stage III chronic kidney disease, LEFT lung cancer, atrial fibrillation EXAM: CT ABDOMEN AND PELVIS WITHOUT CONTRAST TECHNIQUE: Multidetector CT imaging of the abdomen and pelvis was performed following the standard protocol without  IV contrast. Sagittal and coronal MPR images reconstructed from axial data set. No oral contrast was administered. COMPARISON:  None. CT from PET-CT exam 05/03/2015 FINDINGS: Lower chest:  Bibasilar atelectasis. Hepatobiliary: Cholelithiasis. Beam hardening artifacts from patient's arms traverse liver, no gross hepatic abnormality seen. Pancreas: Atrophic, otherwise unremarkable Spleen: Traversing artifacts, grossly normal appearance Adrenals/Urinary Tract: Normal appearing adrenal glands. Cyst at inferior pole LEFT kidney again identified 3.4 x 3.6 cm image 49. Kidneys otherwise unremarkable without additional mass or hydronephrosis. Single nondilated ureters bilaterally. Normal appearing well distended urinary bladder. Stomach/Bowel: Normal appendix. Retained dense contrast within colon from recent swallowing study. Stomach and bowel loops otherwise normal appearance for technique. Vascular/Lymphatic: Atherosclerotic calcifications aorta, iliac, and femoral arteries. Few coronary arterial calcifications. Low-attenuation of circulating blood consistent with history of anemia. No adenopathy. Reproductive: N/A Other: Small BILATERAL inguinal hernias containing fat. No free air or free fluid. Musculoskeletal: Degenerative disc disease changes L4-L5. No acute osseous findings. IMPRESSION: LEFT renal cyst. Cholelithiasis. Diffuse colonic diverticulosis without gross evidence acute diverticulitis. Small BILATERAL inguinal hernias containing fat. Aortic atherosclerosis. Electronically Signed   By: Lavonia Dana M.D.   On: 07/04/2015 12:55   Dg Chest 1 View  07/08/2015  CLINICAL DATA:  Altered mental status for 1 day. History of lung carcinoma EXAM: CHEST 1 VIEW COMPARISON:  Chest radiograph July 06, 2015; PET-CT May 03, 2015 FINDINGS: There is again noted opacity in the left mid lung region extending to the left hilum, likely due to mass with postobstructive pneumonitis. Right lung clear. Heart is upper normal in size  with pulmonary vascularity within normal limits. Calcification noted in the aortic arch. Port-A-Cath tip in superior vena cava. No pneumothorax. No bone lesions evident. IMPRESSION: Stable left midlung opacity, likely due to mass with postobstructive pneumonitis. Lungs elsewhere clear. No new opacity. Stable cardiac silhouette. Aortic atherosclerosis. Electronically Signed   By: Lowella Grip III M.D.   On: 07/08/2015 19:57   Dg Chest 2 View  07/02/2015  CLINICAL DATA:  Persistent cough, COPD, lung malignancy. Patient cyst has sustained an intracranial hemorrhage with right hemi [place] is EXAM: CHEST  2 VIEW COMPARISON:  Chest x-ray of June 26, 2015 FINDINGS: The right lung is adequately inflated and exhibits increased density in the infrahilar region. There is some overlap here of the Port-A-Cath reservoir. On the left there is persistent increased density in the perihilar region extending toward the lateral pleural surface. The heart is top-normal in size. There are post CABG changes. Three upper sternal wires are chronically broken. There is calcification in the wall of the aortic arch. There is no pleural effusion or pneumothorax. The Port-A-Cath appliance tip projects over the midportion of the SVC. IMPRESSION: 1. Subtle increased density at the right lung base suggests subsegmental atelectasis. Stable parenchymal density in the left mid lung and left perihilar region. 2. Stable post CABG changes.  No evidence of CHF. Electronically Signed   By: David  Martinique M.D.   On: 07/02/2015 09:40   Dg Chest 2 View  06/26/2015  CLINICAL DATA:  C/o chronic cough. 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 Rales heard in right lower lung. 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 EXAM: CHEST  2 VIEW COMPARISON:  06/25/2015 FINDINGS: There stable changes from prior CABG surgery. Cardiac silhouette is mildly enlarged. Focal irregular opacity in the posterior perihilar region  of the left lung is stable. There are no new areas of lung opacification. No pleural effusion or pneumothorax. Right anterior chest wall Port-A-Cath is stable. Bony thorax is intact. IMPRESSION: 1. No acute cardiopulmonary disease. 2. No change from the prior study. Persistent focal opacity in the perihilar region of the left lung consistent with the reported squamous cell lung carcinoma. Electronically Signed   By: Lajean Manes M.D.   On: 06/26/2015 16:36   Dg Chest 2 View  06/25/2015  CLINICAL DATA:  Cough, left-sided weakness EXAM: CHEST  2 VIEW COMPARISON:  07/05/2015 FINDINGS: Cardiomediastinal silhouette is stable. Right IJ Port-A-Cath is unchanged in position. Persistent left upper lobe consolidation. Bony thorax is unremarkable. IMPRESSION: No significant change. Persistent left upper lobe consolidation. Stable right IJ Port-A-Cath position. Electronically Signed   By: Lahoma Crocker M.D.   On: 06/25/2015 16:54   Ct Head Wo Contrast  06/26/2015  CLINICAL DATA:  Slurred speech. EXAM: CT HEAD WITHOUT CONTRAST TECHNIQUE: Contiguous axial images were obtained from the base of the skull through the vertex without intravenous contrast. COMPARISON:  MRI of June 14, 2015.  CT scan of June 13, 2015. FINDINGS: Bony calvarium appears intact.  Sphenoid sinusitis is noted. Mild diffuse cortical atrophy is noted. There is continued presence of left superior parietal intraparenchymal hemorrhage which  now measures 21 x 11 mm and is slightly decreased in size compared to prior exam. Subarachnoid hemorrhage has resolved. There is continued surrounding vasogenic edema. No significant midline shift is noted. Ventricular size is unremarkable. IMPRESSION: Mild diffuse cortical atrophy. Continued presence of left parietal intraparenchymal hemorrhage with surrounding vasogenic edema. This is improved compared to prior exam. Subarachnoid hemorrhage is no longer present. Electronically Signed   By: Marijo Conception, M.D.   On:  06/26/2015 14:29   Ct Head Wo Contrast  06/13/2015  CLINICAL DATA:  The woke at 5 a.m. with right-sided weakness. EXAM: CT HEAD WITHOUT CONTRAST TECHNIQUE: Contiguous axial images were obtained from the base of the skull through the vertex without intravenous contrast. COMPARISON:  None. FINDINGS: There is intra cerebral hemorrhage noted in the posterior left frontal lobe. Subarachnoid extension. Hemorrhage measures 2.4 x 2.2 x 1.6 cm for a volume of 4.2 mL. Mild surrounding vasogenic edema. No significant mass effect. No midline shift. No hydrocephalus. No acute calvarial abnormality. Air-fluid level in the left sphenoid sinus. Mastoid air cells are clear. IMPRESSION: Left posterior frontal intra cerebral hemorrhage with subarachnoid extension as described above. Critical Value/emergent results were called by telephone at the time of interpretation on 06/13/2015 at 10:02 am to Dr. Loura Pardon , who verbally acknowledged these results. Electronically Signed   By: Rolm Baptise M.D.   On: 06/13/2015 10:04   Mr Jodene Nam Head Wo Contrast  06/15/2015  ADDENDUM REPORT: 06/15/2015 08:11 ADDENDUM: With the rounded contour of the enhancing component of the left frontal lobe hematoma, follow-up until clearance recommended to exclude underlying vascular abnormality. Electronically Signed   By: Genia Del M.D.   On: 06/15/2015 08:11  06/15/2015  CLINICAL DATA:  75 year old diabetic male with history of lung cancer. Right-sided weakness. Subsequent encounter. EXAM: MRI HEAD WITHOUT CONTRAST MRA HEAD WITHOUT CONTRAST TECHNIQUE: Multiplanar, multiecho pulse sequences of the brain and surrounding structures were obtained without intravenous contrast. Angiographic images of the head were obtained using MRA technique without contrast. COMPARISON:  06/13/2015 head CT.  07/28/2014 brain MR. FINDINGS: MRI HEAD FINDINGS Posterior left frontal lobe complex 2.5 x 2.2 x 2 cm hemorrhagic lesion with marked surrounding vasogenic edema with  breakthrough into sulci with subarachnoid hemorrhage noted. Patient's history of lung cancer in addition to 6 mm rounded area of enhanced along the posterior margin of this hemorrhage suggests that this is most likely is related to a hemorrhagic metastatic lesion which has bled. Continued MR surveillance as hemorrhage clears is recommended. There may be a a second enhancing lesion within the anterior left parietal lobe with surrounding vasogenic edema. Venous infarct can have a similar appearance. The major dural sinuses appear patent. No acute thrombotic infarct separate from the above described findings. Remote small left parietal lobe, left frontal lobe, posterior left opercular and tiny right thalamic infarct. Mild small vessel disease changes. Moderate global atrophy without hydrocephalus. Opacification left sphenoid sinus with air-fluid level suggesting acute sinusitis. Mild mucosal thickening ethmoid sinus air cells and minimal mucosal thickening frontal sinuses. Bilateral mastoid air cell and middle ear opacification greater on the left without obstructing lesion of the eustachian tube noted. Post lens replacement without acute orbital abnormality noted. MRA HEAD FINDINGS MR angiogram does not incorporate the left frontal lobe hemorrhage. Mild narrowing supraclinoid aspect internal carotid artery bilaterally with irregularity more notable on the left. Small infundibulum on the left posterior communicating artery level. Anterior circulation without medium or large size vessel significant stenosis or occlusion. Middle cerebral  artery branch vessel irregularity bilaterally. Right vertebral artery ends in a posterior inferior cerebellar artery distribution. No significant narrowing left vertebral artery. Moderate narrowing portions of the left posterior inferior cerebellar artery. Ectatic basilar artery without high-grade stenosis. Nonvisualized anterior inferior cerebellar arteries. Small left superior  cerebellar artery. Posterior cerebral artery distal branch vessel narrowing bilaterally. IMPRESSION: MRI HEAD Posterior left frontal lobe complex 2.5 x 2.2 x 2 cm hemorrhagic lesion with marked surrounding vasogenic edema with breakthrough into sulci with subarachnoid hemorrhage noted. Patient's history of lung cancer in addition to 6 mm rounded area of enhanced along the posterior margin of this hemorrhage suggests that this is most likely is related to a hemorrhagic metastatic lesion which has bled. Continued MR surveillance as hemorrhage clears is recommended. There may be a a second enhancing lesion within the anterior left parietal lobe with surrounding vasogenic edema. Venous infarct can have a similar appearance. The major dural sinuses appear patent. Opacification left sphenoid sinus with air-fluid level suggesting acute sinusitis. Bilateral mastoid air cell and middle ear opacification greater on the left without obstructing lesion of the eustachian tube noted. MRA HEAD MR angiogram does not incorporate the left frontal lobe hemorrhage. Mild narrowing supraclinoid aspect internal carotid artery bilaterally with irregularity more notable on the left. Small infundibulum on the left posterior communicating artery level. Anterior circulation without medium or large size vessel significant stenosis or occlusion. Middle cerebral artery branch vessel irregularity bilaterally. Right vertebral artery ends in a posterior inferior cerebellar artery distribution. No significant narrowing left vertebral artery. Moderate narrowing portions of the left posterior inferior cerebellar artery. Ectatic basilar artery without high-grade stenosis. Nonvisualized anterior inferior cerebellar arteries. Small left superior cerebellar artery. Posterior cerebral artery distal branch vessel narrowing bilaterally. Electronically Signed: By: Genia Del M.D. On: 06/14/2015 19:34   [name] EX Contrast  06/26/2015  CLINICAL DATA:   Initial evaluation for progressively garbled and slurred speech, with increased right-sided facial droop. EXAM: MRI HEAD WITHOUT AND WITH CONTRAST TECHNIQUE: Multiplanar, multiecho pulse sequences of the brain and surrounding structures were obtained without and with intravenous contrast. CONTRAST:  44m MULTIHANCE GADOBENATE DIMEGLUMINE 529 MG/ML IV SOLN COMPARISON:  Prior CT from earlier the same day as well as previous MRI from 06/14/2015. FINDINGS: Generalized cerebral atrophy with minimal chronic small vessel ischemic changes again noted, stable. Small remote lacunar infarct within the right thalamus. Previously identified posterior left frontal lobe complex hemorrhagic lesion again seen, overall likely decreased in size from previous. Lesion measures 13 x 21 x 16 mm on T2 weighted sequences. Surrounding vasogenic edema is similar. Breakthrough subarachnoid hemorrhage within the adjacent cortical sulci again seen, improved. There is increased peripheral enhancement about the margin of the hemorrhagic lesion, likely related to the evolving hematoma. Previously noted small hemorrhagic nodule is not as clearly delineated on this exam. Note again made of a second possible enhancing lesion within the anterior left parietal lobe along the dura, measuring approximately 9 x 11 x 11 mm. Surrounding vasogenic edema. No other new enhancing lesions. Immediately lateral and inferior to the dominant hemorrhagic lesion. There is a focus of abnormal restricted diffusion involving the cortical gray matter and underlying white matter of the posterior left frontal region, compatible with acute ischemic infarct (series 4, image 35). Associated signal loss on ADC map. This appears to be more vascular distribution rather than seizure related. Additional small sub cm focus of restricted diffusion more posteriorly within the left centrum semi ovale. No significant mass effect. Area of infarction  closely approximates the hemorrhagic  lesion can subarachnoid hemorrhage, but does not appear to be hemorrhagic in of itself. No other acute infarct. Gray-white matter differentiation otherwise maintained. Major intracranial vascular flow voids are preserved. No midline shift. No hydrocephalus. No extra-axial fluid collection. Again, major dural sinuses appear to be grossly patent. Craniocervical junction within normal limits. Visualized upper cervical spine unremarkable. Pituitary gland within normal limits. No acute abnormality about the globes and orbits. Patient is status post lens extraction bilaterally. Mucosal thickening with fluid level within the left sphenoid sinus, similar to prior. Mild scattered opacity within the anterior ethmoidal air cells. Bilateral mastoid effusions present. Visualize bone marrow within normal limits. Scalp soft tissues demonstrate no acute abnormality. IMPRESSION: 1. Acute ischemic left MCA territory infarct adjacent to the posterior left frontal hemorrhagic lesion. Vasospasm as an underlying etiology is suspected given the adjacent complex hemorrhagic lesion and subarachnoid hemorrhage. 2. Continued interval evolution of complex left frontal lobe hemorrhagic lesion with similar vasogenic edema. Associated breakthrough subarachnoid hemorrhage is improved. This lesion now demonstrates peripheral enhancement, which would be expected with an evolving hematoma. Previously noted small nodular focus of enhancement not well delineated on this exam. Again, continued MR surveillance at the hemorrhage clears is recommended to ensure no underlying lesion is present. 3. Additional 9 x 11 x 11 mm enhancing lesion within the anterior left parietal lobe, similar to prior. Attention at follow-up recommended. 4. Acute left sphenoid sinus disease with bilateral mastoid effusions, similar to prior. Electronically Signed   By: Jeannine Boga M.D.   On: 06/26/2015 23:30   [name] OA Contrast  06/15/2015  ADDENDUM REPORT:  06/15/2015 08:11 ADDENDUM: With the rounded contour of the enhancing component of the left frontal lobe hematoma, follow-up until clearance recommended to exclude underlying vascular abnormality. Electronically Signed   By: Genia Del M.D.   On: 06/15/2015 08:11  06/15/2015  CLINICAL DATA:  75 year old diabetic male with history of lung cancer. Right-sided weakness. Subsequent encounter. EXAM: MRI HEAD WITHOUT CONTRAST MRA HEAD WITHOUT CONTRAST TECHNIQUE: Multiplanar, multiecho pulse sequences of the brain and surrounding structures were obtained without intravenous contrast. Angiographic images of the head were obtained using MRA technique without contrast. COMPARISON:  06/13/2015 head CT.  07/28/2014 brain MR. FINDINGS: MRI HEAD FINDINGS Posterior left frontal lobe complex 2.5 x 2.2 x 2 cm hemorrhagic lesion with marked surrounding vasogenic edema with breakthrough into sulci with subarachnoid hemorrhage noted. Patient's history of lung cancer in addition to 6 mm rounded area of enhanced along the posterior margin of this hemorrhage suggests that this is most likely is related to a hemorrhagic metastatic lesion which has bled. Continued MR surveillance as hemorrhage clears is recommended. There may be a a second enhancing lesion within the anterior left parietal lobe with surrounding vasogenic edema. Venous infarct can have a similar appearance. The major dural sinuses appear patent. No acute thrombotic infarct separate from the above described findings. Remote small left parietal lobe, left frontal lobe, posterior left opercular and tiny right thalamic infarct. Mild small vessel disease changes. Moderate global atrophy without hydrocephalus. Opacification left sphenoid sinus with air-fluid level suggesting acute sinusitis. Mild mucosal thickening ethmoid sinus air cells and minimal mucosal thickening frontal sinuses. Bilateral mastoid air cell and middle ear opacification greater on the left without  obstructing lesion of the eustachian tube noted. Post lens replacement without acute orbital abnormality noted. MRA HEAD FINDINGS MR angiogram does not incorporate the left frontal lobe hemorrhage. Mild narrowing supraclinoid aspect internal carotid artery  bilaterally with irregularity more notable on the left. Small infundibulum on the left posterior communicating artery level. Anterior circulation without medium or large size vessel significant stenosis or occlusion. Middle cerebral artery branch vessel irregularity bilaterally. Right vertebral artery ends in a posterior inferior cerebellar artery distribution. No significant narrowing left vertebral artery. Moderate narrowing portions of the left posterior inferior cerebellar artery. Ectatic basilar artery without high-grade stenosis. Nonvisualized anterior inferior cerebellar arteries. Small left superior cerebellar artery. Posterior cerebral artery distal branch vessel narrowing bilaterally. IMPRESSION: MRI HEAD Posterior left frontal lobe complex 2.5 x 2.2 x 2 cm hemorrhagic lesion with marked surrounding vasogenic edema with breakthrough into sulci with subarachnoid hemorrhage noted. Patient's history of lung cancer in addition to 6 mm rounded area of enhanced along the posterior margin of this hemorrhage suggests that this is most likely is related to a hemorrhagic metastatic lesion which has bled. Continued MR surveillance as hemorrhage clears is recommended. There may be a a second enhancing lesion within the anterior left parietal lobe with surrounding vasogenic edema. Venous infarct can have a similar appearance. The major dural sinuses appear patent. Opacification left sphenoid sinus with air-fluid level suggesting acute sinusitis. Bilateral mastoid air cell and middle ear opacification greater on the left without obstructing lesion of the eustachian tube noted. MRA HEAD MR angiogram does not incorporate the left frontal lobe hemorrhage. Mild narrowing  supraclinoid aspect internal carotid artery bilaterally with irregularity more notable on the left. Small infundibulum on the left posterior communicating artery level. Anterior circulation without medium or large size vessel significant stenosis or occlusion. Middle cerebral artery branch vessel irregularity bilaterally. Right vertebral artery ends in a posterior inferior cerebellar artery distribution. No significant narrowing left vertebral artery. Moderate narrowing portions of the left posterior inferior cerebellar artery. Ectatic basilar artery without high-grade stenosis. Nonvisualized anterior inferior cerebellar arteries. Small left superior cerebellar artery. Posterior cerebral artery distal branch vessel narrowing bilaterally. Electronically Signed: By: Genia Del M.D. On: 06/14/2015 19:34   Dg Chest Port 1 View  07/06/2015  CLINICAL DATA:  COUGH.HX MI,CA EXAM: PORTABLE CHEST 1 VIEW COMPARISON:  07/02/2015 FINDINGS: Heart size is enlarged. Right-sided Port-A-Cath tip to the level of the lower superior vena cava. There is patchy density in the left upper lobe, stable compared with prior study. Mild interstitial edema is noted. IMPRESSION: 1. Persistent left upper lobe density. 2. Mild interstitial edema. Electronically Signed   By: Nolon Nations M.D.   On: 07/06/2015 14:09   Dg Chest Port 1 View  06/20/2015  CLINICAL DATA:  Cough and congestion. EXAM: PORTABLE CHEST 1 VIEW COMPARISON:  June 18, 2015 FINDINGS: There is persistent airspace consolidation in the left upper lobe with volume loss. Lungs elsewhere clear. Heart is upper normal in size with pulmonary vascularity within normal limits. Port-A-Cath tip is in the superior vena cava near the cavoatrial junction, stable. No pneumothorax. No adenopathy evident. Patient is status post coronary artery bypass grafting. IMPRESSION: Persistent left upper lobe airspace consolidation with volume loss. No change from 3 days prior. Stable cardiac  silhouette. Electronically Signed   By: Lowella Grip III M.D.   On: 07/02/2015 07:25   Dg Chest Port 1 View  06/18/2015  CLINICAL DATA:  Cough EXAM: PORTABLE CHEST 1 VIEW COMPARISON:  06/16/2015 FINDINGS: Stable right jugular Port-A-Cath. Low volumes with bibasilar atelectasis. Left mid lung dense pulmonary opacity is stable. No pneumothorax. IMPRESSION: Stable left mid lung dense pulmonary opacity. Bibasilar atelectasis. Electronically Signed   By: Rodena Goldmann.D.  On: 06/18/2015 15:16   Dg Chest Port 1 View  06/16/2015  CLINICAL DATA:  75 year old male with lung cancer. No chest complaint at this time. EXAM: PORTABLE CHEST 1 VIEW COMPARISON:  Chest radiograph dated 06/15/2015 FINDINGS: There has been interval removal of the endotracheal and enteric tube. The stable area of density is again noted in the left mid lung field. Multiple surgical clips arms in this area. No new consolidation identified. Overall there is improved aeration of the left lower lung field since the prior study. The right lung is clear. There is no pleural effusion or pneumothorax. Stable cardiac silhouette. Median sternotomy wires and CABG vascular clips noted. Right pectoral Port-A-Cath with tip in stable positioning. No acute osseous pathology. IMPRESSION: Interval removal of the endotracheal and enteric tube. Stable appearing density in the left mid lung field with overall improvement of the aeration of the left lung. Electronically Signed   By: Anner Crete M.D.   On: 06/16/2015 19:54   Dg Chest Port 1 View  06/15/2015  CLINICAL DATA:  Acute respiratory failure EXAM: PORTABLE CHEST 1 VIEW COMPARISON:  Portable chest x-ray of 06/14/2015 FINDINGS: Aeration of the lungs has improved somewhat. The tip of the endotracheal tube appears to be approximately 3.8 cm above the carina. Opacity in the left perihilar and left mid lung is unchanged. Cardiomegaly is stable. Right sided Port-A-Cath is unchanged in position, and right IJ  central venous line is unchanged. IMPRESSION: Slightly better aeration. Endotracheal tube tip 3.8 cm above the carina. Electronically Signed   By: Ivar Drape M.D.   On: 06/15/2015 08:05   Dg Chest Port 1 View  06/14/2015  CLINICAL DATA:  Respiratory failure, history of coronary artery disease, GERD, diabetes, lung cancer EXAM: PORTABLE CHEST 1 VIEW COMPARISON:  06/13/2015 FINDINGS: Cardiomediastinal silhouette is stable. Status post median sternotomy. Persistent opacity left hilum and left perihilar region. Post radiation fibrosis left upper lobe again noted. Left basilar atelectasis. Stable endotracheal and NG tube position. Right IJ Port-A-Cath is unchanged in position. Stable right IJ central line. No pneumothorax. No pulmonary edema. IMPRESSION: Stable support apparatus. Persistent opacity left hilum and left perihilar region. Postradiation fibrosis in left upper lobe again noted. Left basilar atelectasis. No pulmonary edema. No pneumothorax. Status post median sternotomy. Electronically Signed   By: Lahoma Crocker M.D.   On: 06/14/2015 08:15   Dg Chest Port 1 View  06/13/2015  CLINICAL DATA:  75 year old male with endotracheal tube placement. EXAM: PORTABLE CHEST 1 VIEW COMPARISON:  Radiograph dated 06/13/2015 FINDINGS: Endotracheal tube with tip approximately 4 cm above the carina. An enteric tube is partially visualized coursing the left hemi abdomen with tip beyond the inferior margin of the image. There is a right IJ central line with tip over the mediastinum. Right pectoral infusion catheter is also noted. There is persistent opacity in the left mid lung field. Multiple surgical clips in the left lower lung field similar to prior study. There is no pneumothorax. The right costophrenic angle has been excluded from the image. The cardiac border is silhouetted. No acute osseous pathology identified. IMPRESSION: Endotracheal tube above the carina. Stable appearing postsurgical changes and persistent opacity  in the left mid lung field. Electronically Signed   By: Anner Crete M.D.   On: 06/13/2015 21:05   Dg Chest Port 1 View  06/13/2015  CLINICAL DATA:  Respiratory failure and cough. History of left lung squamous carcinoma. EXAM: PORTABLE CHEST 1 VIEW COMPARISON:  05/27/2015 and prior radiographs.  05/03/2015 PET  CT FINDINGS: Cardiomegaly, CABG changes and right Port-A-Cath with tip overlying the lower SVC again noted. Left mid lung opacity is stable to slightly increased in size There is no evidence of pneumothorax or pleural effusion. No other changes identified. IMPRESSION: Stable to slightly increased left lung opacity which may represent posttreatment changes. No other significant change. Electronically Signed   By: Margarette Canada M.D.   On: 06/13/2015 18:10   Dg Abd Portable 1v  06/13/2015  CLINICAL DATA:  Orogastric tube placement EXAM: PORTABLE ABDOMEN - 1 VIEW COMPARISON:  PET-CT May 03, 2015 FINDINGS: Orogastric tube tip and side port are in the stomach. Bowel gas pattern is unremarkable. No bowel dilatation or air-fluid level suggesting obstruction. No free air. Moderate stool is noted in the colon. IMPRESSION: Orogastric tube tip and side port in stomach. Bowel gas pattern unremarkable. No free air evident. Electronically Signed   By: Lowella Grip III M.D.   On: 06/13/2015 20:05   Dg Swallowing Func-speech Pathology  06/29/2015  Objective Swallowing Evaluation: Type of Study: MBS-Modified Barium Swallow Study Patient Details Name: BRESLIN HEMANN MRN: [id number] Date of Birth: June 14, 1940 Today's Date: 06/29/2015 Time: SLP Start Time (ACUTE ONLY): 0910-SLP Stop Time (ACUTE ONLY): 0930 SLP Time Calculation (min) (ACUTE ONLY): 20 min Past Medical History: Past Medical History Diagnosis Date . Coronary artery disease  . Pneumonia 2000 . Arthritis  . 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 Bruises easily  . GERD (gastroesophageal reflux disease)    takes Omeprazole daily as needed for stomach  pain . Blood transfusion 2001 . Diabetes mellitus    takes Metformin daily; . Impaired hearing    bil hearing aide . Macular degeneration    being watched for this but hasn't been "completely" diagnosed . Myocardial infarction ([place]) 2001 . Cancer [facility])  Past Surgical History: Past Surgical History Procedure Laterality Date . Coronary artery bypass graft  2001   4 vessels . Cardiac catheterization  2001 . Tonsillectomy     as a child . Colonoscopy   . Cataract extraction     bilateral . Lumbar laminectomy/decompression microdiscectomy  12/19/2010   Procedure: LUMBAR LAMINECTOMY/DECOMPRESSION MICRODISCECTOMY;  Surgeon: Elaina Hoops;  Location: [place] [facility];  Service: Neurosurgery;  Laterality: N/A;  Lumbar three-four, four-five decompressive lumbar laminectomy . Joint replacement  right tkr . Eye surgery     cataracts . Back surgery     l4 5 . Portacath placement N/A 05/20/2014   Procedure: INSERTION PORT-A-CATH;  Surgeon: Nestor Lewandowsky, MD;  Location: [facility] ORS;  Service: General;  Laterality: N/A; HPI: 75 y.o. male patient with PMH: GERD, pna, CAD, DM, MI, cancer and chronic admitted with right-sided hemi-plegia. CT of the head revealed left frontal intracerebral hemorrhage, with subarachnoid hemorrhage as well adjacent to it. Intubated approximately 24-30 hours (extubated 6/6). 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  Objective swallow study ordered to determine safest diet consistencies due to change in function.  No Data Recorded Assessment / Plan / Recommendation CHL IP CLINICAL IMPRESSIONS 06/29/2015 Therapy Diagnosis Moderate oral dysphagia, Moderate pharyngeal dysphagia  Clinical Impression Pt presents with a moderate oropharyngeal dysphagia with both sensory and motor components.  As mentioned on bedside swallow evaluation, pt presents with right sided labial, lingual, and buccal weakness which impacts his ability to contain and transit boluses effectively.   This in combination with decreased pharyngeal sensation result in premature spillage of materials into  the pharynx with swallow response most consistently triggered at the pyriforms.  Delay in swallow response resulted in aspiration of thin liquids with delayed sensation.  Congested cough which occurred several seconds after aspiration event was ineffective for clearing aspirates from the airway.  Deep penetration was noted x1 over multiple boluses of nectar thick liquids which pt was able to sense and clear from the airway before materials passed below the vocal cords.  Poor base of tongue strength and decreased hyolaryngeal excursion also contributed to moderate pharyngeal residuals post swallow (vallecular residue > pyriforms).  Residue cleared to minimal-mild with cues for extra swallows. Recommend that pt initiate a Dys 1 diet with nectar thick liquids and full supervision for use of swallowing precautions.   Prognosis for advancement good with SLP intervention for pharyngeal strengthening exercises and management of safe diet progression.   Impact on safety and function Moderate aspiration risk     Prognosis 06/29/2015 Prognosis for Safe Diet Advancement Good Barriers to Reach Goals -- Barriers/Prognosis Comment -- CHL IP DIET RECOMMENDATION 06/29/2015 SLP Diet Recommendations Dysphagia 1 (Puree) solids;Nectar thick liquid Liquid Administration via Cup Medication Administration Crushed with puree Compensations Slow rate;Small sips/bites;Clear throat intermittently;Multiple dry swallows after each bite/sip Postural Changes --   No flowsheet data found.         CHL IP ORAL PHASE 06/29/2015 Oral Phase Impaired Oral - Pudding Teaspoon -- Oral - Pudding Cup -- Oral - Honey Teaspoon -- Oral - Honey Cup -- Oral - Nectar Teaspoon Weak lingual manipulation;Right anterior bolus loss;Reduced posterior propulsion;Lingual/palatal residue;Delayed oral transit;Decreased bolus cohesion;Premature spillage Oral - Nectar Cup Right  anterior bolus loss;Weak lingual manipulation;Lingual/palatal residue;Delayed oral transit;Decreased bolus cohesion;Premature spillage;Reduced posterior propulsion Oral - Nectar Straw Weak lingual manipulation;Lingual/palatal residue;Decreased bolus cohesion;Premature spillage Oral - Thin Teaspoon Right anterior bolus loss;Weak lingual manipulation;Reduced posterior propulsion;Lingual/palatal residue;Premature spillage;Delayed oral transit;Decreased bolus cohesion Oral - Thin Cup Weak lingual manipulation;Premature spillage;Lingual/palatal residue;Reduced posterior propulsion;Right anterior bolus loss;Delayed oral transit;Decreased bolus cohesion Oral - Thin Straw -- Oral - Puree Right anterior bolus loss;Weak lingual manipulation;Reduced posterior propulsion;Lingual/palatal residue;Delayed oral transit;Decreased bolus cohesion;Premature spillage Oral - Mech Soft -- Oral - Regular -- Oral - Multi-Consistency -- Oral - Pill -- Oral Phase - Comment --  CHL IP PHARYNGEAL PHASE 06/29/2015 Pharyngeal Phase Impaired Pharyngeal- Pudding Teaspoon -- Pharyngeal -- Pharyngeal- Pudding Cup -- Pharyngeal -- Pharyngeal- Honey Teaspoon -- Pharyngeal -- Pharyngeal- Honey Cup -- Pharyngeal -- Pharyngeal- Nectar Teaspoon Delayed swallow initiation-vallecula;Reduced anterior laryngeal mobility;Reduced laryngeal elevation;Reduced tongue base retraction;Reduced airway/laryngeal closure;Pharyngeal residue - posterior pharnyx;Pharyngeal residue - valleculae Pharyngeal -- Pharyngeal- Nectar Cup Delayed swallow initiation-pyriform sinuses;Reduced anterior laryngeal mobility;Reduced laryngeal elevation;Reduced airway/laryngeal closure;Reduced tongue base retraction;Pharyngeal residue - valleculae;Pharyngeal residue - posterior pharnyx;Penetration/Aspiration during swallow Pharyngeal Material enters airway, CONTACTS cords and then ejected out Pharyngeal- Nectar Straw Reduced anterior laryngeal mobility;Reduced laryngeal elevation;Reduced  airway/laryngeal closure;Reduced tongue base retraction;Pharyngeal residue - valleculae;Pharyngeal residue - posterior pharnyx;Pharyngeal residue - pyriform Pharyngeal -- Pharyngeal- Thin Teaspoon Delayed swallow initiation-vallecula;Reduced anterior laryngeal mobility;Reduced laryngeal elevation;Reduced airway/laryngeal closure;Reduced tongue base retraction;Pharyngeal residue - valleculae;Pharyngeal residue - pyriform;Penetration/Aspiration during swallow Pharyngeal Material enters airway, CONTACTS cords and then ejected out Pharyngeal- Thin Cup Delayed swallow initiation-pyriform sinuses;Reduced anterior laryngeal mobility;Reduced laryngeal elevation;Reduced airway/laryngeal closure;Reduced tongue base retraction;Penetration/Aspiration during swallow;Pharyngeal residue - valleculae;Pharyngeal residue - pyriform Pharyngeal Material enters airway, passes BELOW cords without attempt by patient to eject out (silent aspiration) Pharyngeal- Thin Straw -- Pharyngeal -- Pharyngeal- Puree Delayed swallow initiation-vallecula;Reduced anterior laryngeal mobility;Reduced laryngeal elevation;Reduced airway/laryngeal closure;Reduced tongue base retraction;Pharyngeal residue -  valleculae;Pharyngeal residue - pyriform Pharyngeal -- Pharyngeal- Mechanical Soft -- Pharyngeal -- Pharyngeal- Regular -- Pharyngeal -- Pharyngeal- Multi-consistency -- Pharyngeal -- Pharyngeal- Pill -- Pharyngeal -- Pharyngeal Comment --  No flowsheet data found. No flowsheet data found. Page, [name] 06/29/2015, 3:21 PM                CBC  Recent Labs Lab 07/04/15 1725  07/05/15 0443 07/06/15 0457 07/07/15 0530 07/08/15 0430 07/09/15 0500  WBC 4.1  --  5.4 5.7 5.6  --  6.3  HGB 8.2*  < > 8.9* 8.9* 8.8* 8.9* 9.1*  HCT 26.4*  < > 28.9* 28.4* 28.5* 28.7* 29.4*  PLT 199  --  193 193 183  --  216  MCV 87.7  --  85.5 85.3 85.3  --  88.0  MCH 27.2  --  26.3 26.7 26.3  --  27.2  MCHC 31.1  --  30.8 31.3 30.9  --  31.0  RDW 17.6*  --  17.8*  17.3* 17.4*  --  17.3*  LYMPHSABS  --   --  0.7  --   --   --  0.9  MONOABS  --   --  0.7  --   --   --  1.0  EOSABS  --   --  0.2  --   --   --  0.2  BASOSABS  --   --  0.0  --   --   --  0.0  < > = values in this interval not displayed.  Chemistries   Recent Labs Lab 07/05/15 0443 07/07/15 1430 07/09/15 0500  NA 134* 136 135  K 4.5 5.1 4.5  CL 105 105 103  CO2 '22 22 22  '$ GLUCOSE 128* 92 99  BUN <5* 10 12  CREATININE 1.14 1.51* 1.36*  CALCIUM 9.6 9.8 9.6  AST  --  20  --   ALT  --  16*  --   ALKPHOS  --  102  --   BILITOT  --  0.5  --    ------------------------------------------------------------------------------------------------------------------ estimated creatinine clearance is 51.5 mL/min (by C-G formula based on Cr of 1.36). ------------------------------------------------------------------------------------------------------------------ No results for input(s): HGBA1C in the last 72 hours. ------------------------------------------------------------------------------------------------------------------ No results for input(s): CHOL, HDL, LDLCALC, TRIG, CHOLHDL, LDLDIRECT in the last 72 hours. ------------------------------------------------------------------------------------------------------------------ No results for input(s): TSH, T4TOTAL, T3FREE, THYROIDAB in the last 72 hours.  Invalid input(s): FREET3 ------------------------------------------------------------------------------------------------------------------ No results for input(s): VITAMINB12, FOLATE, FERRITIN, TIBC, IRON, RETICCTPCT in the last 72 hours.  Coagulation profile No results for input(s): INR, PROTIME in the last 168 hours.  No results for input(s): DDIMER in the last 72 hours.  Cardiac Enzymes No results for input(s): CKMB, TROPONINI, MYOGLOBIN in the last 168 hours.  Invalid input(s):  CK ------------------------------------------------------------------------------------------------------------------ Invalid input(s): POCBNP   CBG:  Recent Labs Lab 07/08/15 0655 07/08/15 1137 07/08/15 1626 07/08/15 2136 07/09/15 0711  GLUCAP 112* 174* 80 77 127*       Studies: Dg Chest 1 View  07/08/2015  CLINICAL DATA:  Altered mental status for 1 day. History of lung carcinoma EXAM: CHEST 1 VIEW COMPARISON:  Chest radiograph July 06, 2015; PET-CT May 03, 2015 FINDINGS: There is again noted opacity in the left mid lung region extending to the left hilum, likely due to mass with postobstructive pneumonitis. Right lung clear. Heart is upper normal in size with pulmonary vascularity within normal limits. Calcification noted in the aortic arch. Port-A-Cath tip in superior vena cava. No pneumothorax.  No bone lesions evident. IMPRESSION: Stable left midlung opacity, likely due to mass with postobstructive pneumonitis. Lungs elsewhere clear. No new opacity. Stable cardiac silhouette. Aortic atherosclerosis. Electronically Signed   By: Lowella Grip III M.D.   On: 07/08/2015 19:57      Lab Results  Component Value Date   HGBA1C 6.8* 06/14/2015   HGBA1C 7.4* 04/17/2015   HGBA1C 8.0* 05/22/2013   Lab Results  Component Value Date   LDLCALC 145* 06/14/2015   CREATININE 1.36* 07/09/2015       Scheduled Meds: . amiodarone  200 mg Oral Daily  . antiseptic oral rinse  7 mL Mouth Rinse BID  . atorvastatin  40 mg Oral q1800  . diclofenac sodium  2 g Topical QID  . diltiazem  60 mg Oral BID  . insulin aspart  0-24 Units Subcutaneous TID AC & HS  . iron polysaccharides  150 mg Oral Daily  . lactose free nutrition  237 mL Oral TID WC  . levETIRAcetam  1,000 mg Intravenous Q12H  . metFORMIN  1,000 mg Oral Q breakfast  . metFORMIN  500 mg Oral Q supper  . pantoprazole sodium  40 mg Oral Daily  . rivaroxaban  15 mg Oral Q supper  . vitamin B-12  1,000 mcg Oral Daily    Continuous Infusions:     LOS: 18 days    Time spent: >20 [facility] Pager [phone]. If 7PM-7AM, please contact night-coverage at [email or web address], password [facility] 07/09/2015, 9:22 AM  LOS: 18 days

## 2015-07-09 NOTE — Progress Notes (Signed)
Orthopedic Tech Progress Note Patient Details:  Ivan Beasley 31-Oct-1940 [id number]  Patient ID: [name], male   DOB: [birth year], 75 y.o.   MRN: [id number]  Called in advanced brace order; spoke with Cletis Athens, [name] 07/09/2015, 9:46 AM

## 2015-07-09 NOTE — Progress Notes (Signed)
Physical Therapy Session Note  Patient Details  Name: Ivan Beasley MRN: [id number] Date of Birth: August 28, 1940  Today's Date: 07/09/2015 PT Individual Time: 0800-0900 PT Individual Time Calculation (min): 60 min   Short Term Goals: Week 3:  PT Short Term Goal 1 (Week 3): =LTG due to estimated length of stay  Skilled Therapeutic Interventions/Progress Updates:    Pt received supine in bed, c/o L knee pain as described below and agreeable to treatment. Rolling R/L modA with bedrails to A with donning pants maxA. Supine>sit modA with bedrails. Transfer bed>w/c with wife providing maxA and min verbal cues from therapist for technique and sequencing. Educated wife in hoyer lift and possibility of use at home for when pt and/or wife are fatigued. Transfer via maxi move to/from tilt table. Static standing at 60 degrees 2x5 min each for weight bearing through BLEs; O2 fluctuated throughout trials down to 88%, increased to WNL with cueing for deep breathing. Returned to room and remained seated in w/c at completion of session, all needs within reach and wife present.   Therapy Documentation Precautions:  Precautions Precautions: Fall Precaution Comments: R hemi, pt on home O2 PTA for recent pneumonia (but used it PRN, not 24/7) Restrictions Weight Bearing Restrictions: No Pain: Pain Assessment Pain Assessment: Faces Pain Score: Asleep Faces Pain Scale: Hurts whole lot Pain Type: Acute pain Pain Location: Knee Pain Orientation: Left Pain Descriptors / Indicators: Aching;Stabbing Pain Frequency: Intermittent Pain Onset: Unable to tell Patients Stated Pain Goal: 3 Pain Intervention(s): RN made aware;Repositioned Multiple Pain Sites: No   See Function Navigator for Current Functional Status.   Therapy/Group: Individual Therapy  Luberta Mutter 07/09/2015, 9:13 AM

## 2015-07-09 NOTE — Progress Notes (Signed)
Subjective/Complaints: Pt sleeping comfortably in bed.  He remains confused.    Review of systems: limited by dysarthria and ?aphasia, however, appears to deny CP, SOB, N/V/D.  Objective: Vital Signs: Blood pressure 115/58, pulse 94, temperature 98 F (36.7 C), temperature source Oral, resp. rate 19, weight 91.8 kg (202 lb 6.1 oz), SpO2 99 %. Dg Chest 1 View  07/08/2015  CLINICAL DATA:  Altered mental status for 1 day. History of lung carcinoma EXAM: CHEST 1 VIEW COMPARISON:  Chest radiograph July 06, 2015; PET-CT May 03, 2015 FINDINGS: There is again noted opacity in the left mid lung region extending to the left hilum, likely due to mass with postobstructive pneumonitis. Right lung clear. Heart is upper normal in size with pulmonary vascularity within normal limits. Calcification noted in the aortic arch. Port-A-Cath tip in superior vena cava. No pneumothorax. No bone lesions evident. IMPRESSION: Stable left midlung opacity, likely due to mass with postobstructive pneumonitis. Lungs elsewhere clear. No new opacity. Stable cardiac silhouette. Aortic atherosclerosis. Electronically Signed   By: Lowella Grip III M.D.   On: 07/08/2015 19:57   Results for orders placed or performed during the hospital encounter of 06/10/2015 (from the past 72 hour(s))  Glucose, capillary     Status: Abnormal   Collection Time: 07/06/15 12:18 PM  Result Value Ref Range   Glucose-Capillary 146 (H) 65 - 99 mg/dL  Glucose, capillary     Status: Abnormal   Collection Time: 07/06/15  4:35 PM  Result Value Ref Range   Glucose-Capillary 116 (H) 65 - 99 mg/dL  Glucose, capillary     Status: Abnormal   Collection Time: 07/06/15  9:00 PM  Result Value Ref Range   Glucose-Capillary 117 (H) 65 - 99 mg/dL  Occult blood card to lab, stool RN will collect     Status: None   Collection Time: 07/07/15  2:37 AM  Result Value Ref Range   Fecal Occult Bld NEGATIVE NEGATIVE  CBC     Status: Abnormal   Collection Time:  07/07/15  5:30 AM  Result Value Ref Range   WBC 5.6 4.0 - 10.5 K/uL   RBC 3.34 (L) 4.22 - 5.81 MIL/uL   Hemoglobin 8.8 (L) 13.0 - 17.0 g/dL   HCT 28.5 (L) 39.0 - 52.0 %   MCV 85.3 78.0 - 100.0 fL   MCH 26.3 26.0 - 34.0 pg   MCHC 30.9 30.0 - 36.0 g/dL   RDW 17.4 (H) 11.5 - 15.5 %   Platelets 183 150 - 400 K/uL  Glucose, capillary     Status: Abnormal   Collection Time: 07/07/15  6:47 AM  Result Value Ref Range   Glucose-Capillary 136 (H) 65 - 99 mg/dL  Glucose, capillary     Status: Abnormal   Collection Time: 07/07/15 11:30 AM  Result Value Ref Range   Glucose-Capillary 143 (H) 65 - 99 mg/dL  Comprehensive metabolic panel     Status: Abnormal   Collection Time: 07/07/15  2:30 PM  Result Value Ref Range   Sodium 136 135 - 145 mmol/L   Potassium 5.1 3.5 - 5.1 mmol/L   Chloride 105 101 - 111 mmol/L   CO2 22 22 - 32 mmol/L   Glucose, Bld 92 65 - 99 mg/dL   BUN 10 6 - 20 mg/dL   Creatinine, Ser 1.51 (H) 0.61 - 1.24 mg/dL   Calcium 9.8 8.9 - 10.3 mg/dL   Total Protein 7.0 6.5 - 8.1 g/dL   Albumin 3.3 (L) 3.5 -  5.0 g/dL   AST 20 15 - 41 U/L   ALT 16 (L) 17 - 63 U/L   Alkaline Phosphatase 102 38 - 126 U/L   Total Bilirubin 0.5 0.3 - 1.2 mg/dL   GFR calc non Af Amer 43 (L) >60 mL/min   GFR calc Af Amer 50 (L) >60 mL/min    Comment: (NOTE) The eGFR has been calculated using the CKD EPI equation. This calculation has not been validated in all clinical situations. eGFR's persistently <60 mL/min signify possible Chronic Kidney Disease.    Anion gap 9 5 - 15  Glucose, capillary     Status: Abnormal   Collection Time: 07/07/15  4:23 PM  Result Value Ref Range   Glucose-Capillary 106 (H) 65 - 99 mg/dL  Glucose, capillary     Status: None   Collection Time: 07/07/15  8:40 PM  Result Value Ref Range   Glucose-Capillary 96 65 - 99 mg/dL   Comment 1 Notify RN   Hemoglobin and hematocrit, blood     Status: Abnormal   Collection Time: 07/08/15  4:30 AM  Result Value Ref Range    Hemoglobin 8.9 (L) 13.0 - 17.0 g/dL   HCT 28.7 (L) 39.0 - 52.0 %  Glucose, capillary     Status: Abnormal   Collection Time: 07/08/15  6:55 AM  Result Value Ref Range   Glucose-Capillary 112 (H) 65 - 99 mg/dL   Comment 1 Notify RN   Glucose, capillary     Status: Abnormal   Collection Time: 07/08/15 11:37 AM  Result Value Ref Range   Glucose-Capillary 174 (H) 65 - 99 mg/dL  Glucose, capillary     Status: None   Collection Time: 07/08/15  4:26 PM  Result Value Ref Range   Glucose-Capillary 80 65 - 99 mg/dL  Glucose, capillary     Status: None   Collection Time: 07/08/15  9:36 PM  Result Value Ref Range   Glucose-Capillary 77 65 - 99 mg/dL  CBC with Differential/Platelet     Status: Abnormal   Collection Time: 07/09/15  5:00 AM  Result Value Ref Range   WBC 6.3 4.0 - 10.5 K/uL   RBC 3.34 (L) 4.22 - 5.81 MIL/uL   Hemoglobin 9.1 (L) 13.0 - 17.0 g/dL   HCT 29.4 (L) 39.0 - 52.0 %   MCV 88.0 78.0 - 100.0 fL   MCH 27.2 26.0 - 34.0 pg   MCHC 31.0 30.0 - 36.0 g/dL   RDW 17.3 (H) 11.5 - 15.5 %   Platelets 216 150 - 400 K/uL   Neutrophils Relative % 67 %   Neutro Abs 4.3 1.7 - 7.7 K/uL   Lymphocytes Relative 14 %   Lymphs Abs 0.9 0.7 - 4.0 K/uL   Monocytes Relative 15 %   Monocytes Absolute 1.0 0.1 - 1.0 K/uL   Eosinophils Relative 4 %   Eosinophils Absolute 0.2 0.0 - 0.7 K/uL   Basophils Relative 0 %   Basophils Absolute 0.0 0.0 - 0.1 K/uL  Basic metabolic panel     Status: Abnormal   Collection Time: 07/09/15  5:00 AM  Result Value Ref Range   Sodium 135 135 - 145 mmol/L   Potassium 4.5 3.5 - 5.1 mmol/L   Chloride 103 101 - 111 mmol/L   CO2 22 22 - 32 mmol/L   Glucose, Bld 99 65 - 99 mg/dL   BUN 12 6 - 20 mg/dL   Creatinine, Ser 1.36 (H) 0.61 - 1.24 mg/dL   Calcium  9.6 8.9 - 10.3 mg/dL   GFR calc non Af Amer 49 (L) >60 mL/min   GFR calc Af Amer 57 (L) >60 mL/min    Comment: (NOTE) The eGFR has been calculated using the CKD EPI equation. This calculation has not been  validated in all clinical situations. eGFR's persistently <60 mL/min signify possible Chronic Kidney Disease.    Anion gap 10 5 - 15  Urinalysis, Routine w reflex microscopic (not at [facility])     Status: None   Collection Time: 07/09/15  5:05 AM  Result Value Ref Range   Color, Urine YELLOW YELLOW   APPearance CLEAR CLEAR   Specific Gravity, Urine 1.017 1.005 - 1.030   pH 6.0 5.0 - 8.0   Glucose, UA NEGATIVE NEGATIVE mg/dL   Hgb urine dipstick NEGATIVE NEGATIVE   Bilirubin Urine NEGATIVE NEGATIVE   Ketones, ur NEGATIVE NEGATIVE mg/dL   Protein, ur NEGATIVE NEGATIVE mg/dL   Nitrite NEGATIVE NEGATIVE   Leukocytes, UA NEGATIVE NEGATIVE    Comment: MICROSCOPIC NOT DONE ON URINES WITH NEGATIVE PROTEIN, BLOOD, LEUKOCYTES, NITRITE, OR GLUCOSE <1000 mg/dL.  Glucose, capillary     Status: Abnormal   Collection Time: 07/09/15  7:11 AM  Result Value Ref Range   Glucose-Capillary 127 (H) 65 - 99 mg/dL     HEENT: Normocephalic. Atraumatic Cardio: irregular irregular Resp: CTA B/L and unlabored GI: BS positive and nontender nondistended Skin:   Intact. Warm and dry. Neuro: Flat Motor RUE/RLE: 0/5 Moving LUE/LUE without difficulty Dysarthric and Aphasic Right facial weakness Musc/Skel:  No edema. No tenderness. Gen. no acute distress. Vital signs reviewed.  Assessment/Plan: 1. 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 Physiatrist is providing close team supervision and 24 hour management of active medical problems listed below. Physiatrist and rehab team continue to assess barriers to discharge/monitor patient progress toward functional and medical goals. FIM: Function - Bathing Position: Bed Body parts bathed by patient: Chest, Abdomen, Right arm, Front perineal area Body parts bathed by helper: Back, Buttocks Bathing not applicable: Right  upper leg, Left upper leg, Right lower leg, Left lower leg Assist Level: 2 helpers (Total assist)  Function- Upper Body Dressing/Undressing What is the patient wearing?: Pull over shirt/dress Pull over shirt/dress - Perfomed by patient: Thread/unthread left sleeve, Put head through opening Pull over shirt/dress - Perfomed by helper: Pull shirt over trunk, Thread/unthread right sleeve Assist Level: 2 helpers (Max assist, +2 for sitting balance) Function - Lower Body Dressing/Undressing What is the patient wearing?: Pants, Non-skid slipper socks Position: Bed Pants- Performed by helper: Thread/unthread right pants leg, Thread/unthread left pants leg, Pull pants up/down Non-skid slipper socks- Performed by helper: Don/doff right sock, Don/doff left sock Assist for footwear: Dependant Assist for lower body dressing:  (Total assist)  Function - Toileting Toileting activity did not occur: No continent bowel/bladder event Assist level: Two helpers (per [employer], NT)  Function - Air cabin crew transfer activity did not occur: Safety/medical concerns Assist level to toilet: 2 helpers (per Berkley Harvey, NT) Assist level from toilet: 2 helpers  Function - Chair/bed transfer Chair/bed transfer activity did not occur: Safety/medical concerns Chair/bed transfer method: Lateral scoot Chair/bed transfer assist level: Maximal assist (Pt 25 - 49%/lift and lower) Chair/bed transfer assistive device: Armrests, Sliding board Mechanical lift: Stedy Chair/bed transfer details: Verbal cues for precautions/safety, Manual facilitation for weight shifting, Manual facilitation for placement, Verbal cues  for sequencing, Verbal cues for technique, Tactile cues for placement, Tactile cues for posture, Tactile cues for weight shifting  Function - Locomotion: Wheelchair Will patient use wheelchair at discharge?: Yes Type: Manual Max wheelchair distance: 10 Assist Level: Maximal assistance (Pt 25 -  49%) Assist Level: Maximal assistance (Pt 25 - 49%) Wheel 150 feet activity did not occur: Safety/medical concerns Turns around,maneuvers to table,bed, and toilet,negotiates 3% grade,maneuvers on rugs and over doorsills: No Function - Locomotion: Ambulation Ambulation activity did not occur: Safety/medical concerns Assistive device: Other (comment) (three muskateers) Max distance: 25 Assist level: 2 helpers Assist level: 2 helpers  Function - Comprehension Comprehension: Auditory Comprehension assistive device: Hearing aids Comprehension assist level: Understands basic 75 - 89% of the time/ requires cueing 10 - 24% of the time  Function - Expression Expression: Verbal Expression assist level: Expresses basic 50 - 74% of the time/requires cueing 25 - 49% of the time. Needs to repeat parts of sentences.  Function - Social Interaction Social Interaction assist level: Interacts appropriately 50 - 74% of the time - May be physically or verbally inappropriate.  Function - Problem Solving Problem solving assist level: Solves basic 50 - 74% of the time/requires cueing 25 - 49% of the time  Function - Memory Memory assist level: Recognizes or recalls 50 - 74% of the time/requires cueing 25 - 49% of the time Patient normally able to recall (first 3 days only): Current season    Medical Problem List and Plan: 1.  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 Plan to follow-up MRI of the brain after ICH absorbed for further evaluation of possible metastases posterior left frontal lobe.   Cont CIR  Plan to repeat MRI next week prior to discharge with neurology to follow up, Xarelto for stroke prophylaxis  Will order braces for RUE 2.  DVT Prophylaxis/Anticoagulation: SCDs. Monitor for signs of DVT, Xarelto should help with this as well 3. Pain Management: Ultram and oxycodone as needed 4. Mood: Xanax 0.25 mg 3 times daily as needed 5. Neuropsych: This  patient is not capable of making decisions on his own behalf with caregiver/staff assistance. 6. Skin/Wound Care: Routine skin checks 7. Fluids/Electrolytes/Nutrition: Routine I&O's with follow-up chemistries,severe swallowing difficulties, meal intake is 75-100%   8. Seizure prophylaxis. Keppra now at 1000 mg twice a day---more alert. EEG displayed seizure activity 9. Atrial fibrillation. Cardiac rate control. Continue amiodarone 200 mg daily, Cardizem 60 mg twice a day.Eliquis held secondary to [place], family does not want to resume, would be comfortable with Xarelto , Initiated low-dose due to stable hemoglobin Filed Vitals:   07/08/15 1439 07/09/15 0440  BP: 110/46 115/58  Pulse: 94 94  Temp: 98 F (36.7 C) 98 F (36.7 C)  Resp: 16 19   10. 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 This is likely the etiology of the persistent cough, Right lung nodules,As noted plan follow-up MRI after ICH absorbed for further evaluation of possible metastasis 11. Diabetes mellitus with peripheral neuropathy. Hemoglobin A1c 6.8. Glucophage 1000 mg at breakfast and 500 mg supper ,  Amaryl 1 mg daily. Check blood sugars before meals and at bedtime Generally well controlled on the current regimen CBG (last 3)   Recent Labs  07/08/15 1626 07/08/15 2136 07/09/15 0711  GLUCAP 80 77 127*   12. Hyperlipidemia. Lipitor 13. UTI enterobacter cloacae- Sensitive to ceftriaxone. Started 1 g every 24 hours.  Afebrile ,  7 day course    Will complete on Monday           14. Anemia stool guaiac negative X 2,    As per internal medicine now on Xarelto. This was also Recommended by neurology.  Hemoglobin stable at 9.1 on 6/30.    Cont to monitor 15. CKD  Cr. 1.36 on 6/30    LOS (Days) 18 A FACE TO FACE EVALUATION WAS PERFORMED  Sherle Mello Lorie Phenix 07/09/2015, 9:35 AM

## 2015-07-09 NOTE — Progress Notes (Signed)
Patient requested a breathing treatment. Respiratory was called but unavailable at the time. Oxygen saturation was 90% on RA, pulse 93. Sat patient up to 30 degress & his oxygen went up to 93% & pulse to 97. Congestion noted, breathing treatment initiated. Patient drifted in & out of sleep during the treatment, with 2 episodes of coughing. Oxygen saturation range was between 100 & 99% during treatment, pulse rate 93-97. When completed, pulse rate 94 & oxygen saturation at 100%. No respiratory distress noted. Will continue to monitor

## 2015-07-09 NOTE — Plan of Care (Signed)
Pt's plan of care adjusted to 15 hours over 7 days after speaking with care team and discussed with MD in team conference as pt currently unable to tolerate current therapy schedule with OT, PT, and SLP.

## 2015-07-09 NOTE — Progress Notes (Cosign Needed)
Occupational Therapy Session Note  Patient Details  Name: Ivan Beasley MRN: [id number] Date of Birth: February 22, 1940  Today's Date: 07/09/2015 OT Individual Time:  -  1300-1400 60 minutes       Skilled Therapeutic Interventions/Progress Updates:    [name] reported that pt "was not having a good day" upon skilled OT arrival. Pt exhibited dysarthria and difficultly communicating, but was able to communicate discomfort in L LE and overall fatigue. [name] reported that pt needed to change clothes. Functional slideboard transfer was utilized for pt to complete dressing in bed. Pt completed slideboard transfer from w/c to bed with Total Ax2 (patient 30%) max vcs for safety and proper hand placement to complete ADL bedlevel. Wife was present to observe proper technique of transferring patient. Pt completed UB dressing at EOB, exhibited strong right sided pushing and left posterior leaning with verbal cues and manual cues required to maintain upright posture. Pt completed UB dressing with Total A for maintaining balance at EOB, orienting shirt, threading R UE into sleeve, and pulling down in back. Nurse contacted for IV handling to complete dressing. Pt finished LB dressing supine in bed with max vcs for proper hemi technique when rolling and manual facilitation of properly place LEs during rolling. LB dressing required Total Ax2 with pt able to provide 20% assistance when rolling to left side but was able to roll to right side with Mod A. Pt left in bed with all needs within reach, Prayfo boot donned on R foot, bilateral LEs and L UE elevated with resting hand splint donned. Splint was delivered during session and caregiver was provided education on proper wearing schedule and technique of donning/doffing. At this time, pt benefits from remediation of static/dynamic sitting balance, activity tolerance, and neuro re education of R UE.   Therapy Documentation Precautions:  Precautions Precautions:  Fall Precaution Comments: R hemi, pt on home O2 PTA for recent pneumonia (but used it PRN, not 24/7) Restrictions Weight Bearing Restrictions: No General:   Vital Signs: Therapy Vitals Temp: 98.4 F (36.9 C) Temp Source: Oral Pulse Rate: 99 Resp: 17 BP: (!) 126/50 mmHg Patient Position (if appropriate): Lying Oxygen Therapy SpO2: 99 % O2 Device: Not Delivered Pain: Pain Assessment Pain Assessment: No/denies pain     See Function Navigator for Current Functional Status.   Therapy/Group: Individual Therapy  Mukesh Kornegay A Abenezer Odonell 07/09/2015, 3:31 PM

## 2015-07-10 ENCOUNTER — Inpatient Hospital Stay (HOSPITAL_COMMUNITY): Payer: Medicare HMO

## 2015-07-10 ENCOUNTER — Inpatient Hospital Stay (HOSPITAL_COMMUNITY): Payer: Medicare HMO | Admitting: Physical Therapy

## 2015-07-10 DIAGNOSIS — Z515 Encounter for palliative care: Secondary | ICD-10-CM | POA: Insufficient documentation

## 2015-07-10 DIAGNOSIS — F05 Delirium due to known physiological condition: Secondary | ICD-10-CM

## 2015-07-10 LAB — GLUCOSE, CAPILLARY
GLUCOSE-CAPILLARY: 120 mg/dL — AB (ref 65–99)
GLUCOSE-CAPILLARY: 130 mg/dL — AB (ref 65–99)
Glucose-Capillary: 134 mg/dL — ABNORMAL HIGH (ref 65–99)
Glucose-Capillary: 118 mg/dL — ABNORMAL HIGH (ref 65–99)

## 2015-07-10 LAB — OCCULT BLOOD X 1 CARD TO LAB, STOOL: Fecal Occult Bld: NEGATIVE

## 2015-07-10 LAB — URINE CULTURE: Culture: <10000 — AB

## 2015-07-10 MED ORDER — HYDROCODONE-HOMATROPINE 5-1.5 MG/5ML PO SYRP
5.0000 mL | ORAL_SOLUTION | Freq: Four times a day (QID) | ORAL | Status: DC | PRN
  Administered 2015-07-10 – 2015-07-13 (×7): 5 mL via ORAL
  Filled 2015-07-10 (×7): qty 5

## 2015-07-10 NOTE — Progress Notes (Signed)
Occupational Therapy Session Note  Patient Details  Name: Ivan Beasley MRN: [id number] Date of Birth: 11/24/1940  Today's Date: 07/10/2015 OT Individual Time: 7262-0355 OT Individual Time Calculation (min): 75 min    Short Term Goals: Week 1:  OT Short Term Goal 1 (Week 1): Pt will demonstrate increased awareness to RUE by positioning appropriately to decrease shoulder subluxation OT Short Term Goal 1 - Progress (Week 1): Not met OT Short Term Goal 2 (Week 1): Pt will completed squat pivot transfer to toilet with max assist of one caregiver OT Short Term Goal 2 - Progress (Week 1): Not met OT Short Term Goal 3 (Week 1): Pt will complete UB dressing with mod assist OT Short Term Goal 3 - Progress (Week 1): Not met OT Short Term Goal 4 (Week 1): Pt will complete LB dressing with max assist of one caregiver at sit > stand level OT Short Term Goal 4 - Progress (Week 1): Not met OT Short Term Goal 5 (Week 1): Pt will complete bathing with mod assist at sit > stand level OT Short Term Goal 5 - Progress (Week 1): Not met  Skilled Therapeutic Interventions/Progress Updates:    OT session focused on bed mobility, ADL retraining, postural control, and functional transfers. Pt received supine in bed. Completed bathing and dressing at bed level requiring max assist for rolling L<>R and second person to assist with clothing management. Completed supine>sit with max A and remained in static sitting EOB with supervision for balance and verbal and tactile cues for upright posture. Completed SBT bed>w/c with max A +2, pt assisting 30%. Completed oral care with use of suction toothbrush and setup assist. Pt left sitting in w/c with RN present and all needs in reach.  Therapy Documentation Precautions:  Precautions Precautions: Fall Precaution Comments: R hemi, pt on home O2 PTA for recent pneumonia (but used it PRN, not 24/7) Restrictions Weight Bearing Restrictions: No General:   Vital Signs:    Pain: No report of pain     See Function Navigator for Current Functional Status.   Therapy/Group: Individual Therapy  Duayne Cal 07/10/2015, 11:14 AM

## 2015-07-10 NOTE — Progress Notes (Signed)
Physical Therapy Session Note  Patient Details  Name: Ivan Beasley MRN: [id number] Date of Birth: [birth year]  Today's Date: 07/10/2015 PT Individual Time: 1300-1415 PT Individual Time Calculation (min): 75 min   Short Term Goals: Week 3:  PT Short Term Goal 1 (Week 3): =LTG due to estimated length of stay  Skilled Therapeutic Interventions/Progress Updates:   Patient in bed reporting that condom catheter leaked and upon checking, it was not correctly donned and patient soaking wet in bed. NT attempted to replace condom catheter without success. [name] also reporting increased R wrist pain, therefore donned off the shelf R wrist brace. Session focused on rolling in bed using bed rails multiple times for doffing soiled brief, washing LB and back, and donning clean brief and pants, bed mobility retraining to L using rail with HOB raised and mod A overall, functional slide board transfers bed > wheelchair <> therapy mat with mod A x 2 and therapist verbally reviewing sequencing and technique as patient's wife declined hands-on practice at this time, and static > dynamic sitting balance with multidirectional reaching using LUE slightly outside BOS and across midline and hitting slow motion ball back and forth with supervision-max A with progressive posterior lean as patient fatigued. Patient responded well to verbal/tactile cues for upright posture and anterior pelvic tilt but was unable to maintain without cuing and utilized mirror with cuing for visual feedback for midline orientation and upright posture. Patient left sitting up in wheelchair to consume lunch meal with wife.    Therapy Documentation Precautions:  Precautions Precautions: Fall Precaution Comments: R hemi, pt on home O2 PTA for recent pneumonia (but used it PRN, not 24/7) Restrictions Weight Bearing Restrictions: No Pain: Pain Assessment Pain Assessment: Faces Faces Pain Scale: Hurts little more Pain Type: Acute  pain Pain Location: Wrist Pain Orientation: Right Pain Descriptors / Indicators: Aching Pain Onset: On-going Pain Intervention(s): Splinting;Repositioned (wrist brace donned)   See Function Navigator for Current Functional Status.   Therapy/Group: Individual Therapy  Laretta Alstrom 07/10/2015, 2:17 PM

## 2015-07-10 NOTE — Progress Notes (Signed)
[facility] PROGRESS NOTE  DERREL MOORE KWI:[id number] DOB: 29-Apr-1940 DOA: 06/10/2015  PCP: Albina Billet, MD  Assessment/Plan: Principal Problem:   ICH (intracerebral hemorrhage) ([place])- L frontal ICH Active Problems:   Mild chronic obstructive pulmonary disease (HCC)   Seizures ([place]), possible   CKD (chronic kidney disease), stage III   Anemia   Right hemiparesis ([place])   Cerebrovascular accident (CVA) due to embolism of left middle cerebral artery (HCC)   Chronic anticoagulation   Malignant neoplasm of left lung (HCC)   Adjustment disorder with mixed anxiety and depressed mood   Mental confusion   Type 2 diabetes mellitus with peripheral neuropathy (HCC)   Confusion   Right flaccid hemiparesis (HCC)   Acute blood loss anemia   Acute confusional state   VIVIANO BIR is a 75 y.o. 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 Consultation requested for anemia   Assessment and plan  - Intermittent confusion - Improved significantly with hydration. Continue to monitor. 1. Anemia - H/H remains stable. Patient is back to Xarelto. CT abdomen is negative for retroperitoneal bleed.   2. Recent Right hemiplegia secondary to left MCA infarct adjacent to left frontal ICH with adjacent subarachnoid hemorrhage while on Eliquis for Atrial fib.  - Due to near resolution of ICH as well as has been >2 weeks, and high risk for recurrent embolic stroke off anticoagulation and relative small size of infarct, neurology Dr. Erlinda Hong   recommended on 6/20, to consider transition back to [place], wife okay with Xarelto, this can be started , continue to monitor hemoglobin  MRI with and without contrast 06/26/15 can not confirm or rule out metastasis at this time. As per neurology Still need to repeat MRI with and without contrast after blood all absorbed.  3. Atrial fib. - sounds PAF., Currently normal sinus rhythm EKG  shows normal sinus rhythm  CHADsVASc score at least 6, very high risk for recurrent strokes, will need to weigh risk/benefits of resuming anticoagulation after r/o bleeding risk. Patient is back to Xarelto. 4. htn - stable. Optimize.  5. Dm 2 w/ neuropathy - Optimize. 6. Cad, sp cabg - in light of cardiac hx, will trx for Hb >8 at this time 7. 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 UTI enterobacter cloacae- Sensitive to ceftriaxone. Completed 7 day course of antibiotics. Repeat UA, urine culture and CXR (brief episode of confusion reported last night) 9.  Chronic nonproductive cough-last chest x-ray was on 6/23 with focal increased density in the right lung base, will repeat chest x-ray, doubt pneumonia, continue incentive spirometry, aspiration precautions    DVT prophylaxsis : On Xarelto  Code Status:  Full code       Family Communication: Discussed in detail with the [name], all imaging results, lab results explained to the patient   Disposition Plan: As per Charlett Blake, MD     Consultants:  [facility]  Procedures:   None  Antibiotics: Anti-infectives    Start     Dose/Rate Route Frequency Ordered Stop   06/28/15 1000  cefTRIAXone (ROCEPHIN) 1 g in dextrose  5 % 50 mL IVPB  Status:  Discontinued     1 g 100 mL/hr over 30 Minutes Intravenous Every 24 hours 06/28/15 0847 07/05/15 0834   06/27/15 2100  vancomycin (VANCOCIN) 1,500 mg in sodium chloride 0.9 % 500 mL IVPB  Status:  Discontinued     1,500 mg 250  mL/hr over 120 Minutes Intravenous Every 24 hours 06/26/15 2018 06/28/15 0843      HPI/Subjective: Continues to have a nonproductive cough, otherwise stable, afebrile  Objective: Filed Vitals:   07/09/15 0440 07/09/15 1508 07/09/15 2103 07/10/15 0453  BP: 115/58 126/50 126/62 140/65  Pulse: 94 99  96  Temp: 98 F (36.7 C) 98.4 F (36.9 C)  98.2 F (36.8 C)  TempSrc: Oral Oral  Oral  Resp: '19 17  16  '$ Weight: 91.8 kg (202 lb 6.1 oz) 91.808 kg (202 lb 6.4 oz)  92 kg (202 lb 13.2 oz)  SpO2: 99% 99%  96%    Intake/Output Summary (Last 24 hours) at 07/10/15 0910 Last data filed at 07/10/15 0453  Gross per 24 hour  Intake    240 ml  Output    200 ml  Net     40 ml    Exam:  Examination:  General exam: Appears calm and comfortable  Respiratory system: Clear to auscultation. Respiratory effort normal. Cardiovascular system: S1 & S2 heard, RRR. No JVD, murmurs, rubs, gallops or clicks. No pedal edema. Gastrointestinal system: Abdomen is nondistended, soft and nontender. No organomegaly or masses felt. Normal bowel sounds heard. Central nervous system: Alert and oriented. Right upper extremity weakness Extremities: Symmetric 5 x 5 power. Skin: No rashes, lesions or ulcers Psychiatry: Judgement and insight appear normal. Mood & affect appropriate.     Data Reviewed: I have personally reviewed following labs and imaging studies  Micro Results Recent Results (from the past 240 hour(s))  Culture, Urine     Status: Abnormal   Collection Time: 07/09/15  5:05 AM  Result Value Ref Range Status   Specimen Description URINE, CATHETERIZED  Final   Special Requests NONE  Final   Culture <10,000 COLONIES/mL INSIGNIFICANT GROWTH (A)  Final   Report Status 07/10/2015 FINAL  Final    Radiology Reports Ct Abdomen Pelvis Wo Contrast  07/04/2015  CLINICAL DATA:  Anemia, history coronary disease post MI, diabetes mellitus, former smoker, stage III chronic kidney disease, LEFT lung  cancer, atrial fibrillation EXAM: CT ABDOMEN AND PELVIS WITHOUT CONTRAST TECHNIQUE: Multidetector CT imaging of the abdomen and pelvis was performed following the standard protocol without IV contrast. Sagittal and coronal MPR images reconstructed from axial data set. No oral contrast was administered. COMPARISON:  None. CT from PET-CT exam 05/03/2015 FINDINGS: Lower chest:  Bibasilar atelectasis. Hepatobiliary: Cholelithiasis. Beam hardening artifacts from patient's arms traverse liver, no gross hepatic abnormality seen. Pancreas: Atrophic, otherwise unremarkable Spleen: Traversing artifacts, grossly normal appearance Adrenals/Urinary Tract: Normal appearing adrenal glands. Cyst at inferior pole LEFT kidney again identified 3.4 x 3.6 cm image 49. Kidneys otherwise unremarkable without additional mass or hydronephrosis. Single nondilated ureters bilaterally. Normal appearing well distended urinary bladder. Stomach/Bowel: Normal appendix. Retained dense contrast within colon from recent swallowing study. Stomach and bowel loops otherwise normal appearance for technique. Vascular/Lymphatic: Atherosclerotic calcifications aorta, iliac, and femoral arteries. Few coronary arterial calcifications. Low-attenuation of circulating blood consistent with history of anemia. No adenopathy. Reproductive: N/A Other: Small BILATERAL inguinal hernias containing fat. No free air or free fluid. Musculoskeletal: Degenerative disc disease changes L4-L5. No acute osseous findings.  IMPRESSION: LEFT renal cyst. Cholelithiasis. Diffuse colonic diverticulosis without gross evidence acute diverticulitis. Small BILATERAL inguinal hernias containing fat. Aortic atherosclerosis. Electronically Signed   By: Lavonia Dana M.D.   On: 07/04/2015 12:55   Dg Chest 1 View  07/08/2015  CLINICAL DATA:  Altered mental status for 1 day. History of lung carcinoma EXAM: CHEST 1 VIEW COMPARISON:  Chest radiograph July 06, 2015; PET-CT May 03, 2015  FINDINGS: There is again noted opacity in the left mid lung region extending to the left hilum, likely due to mass with postobstructive pneumonitis. Right lung clear. Heart is upper normal in size with pulmonary vascularity within normal limits. Calcification noted in the aortic arch. Port-A-Cath tip in superior vena cava. No pneumothorax. No bone lesions evident. IMPRESSION: Stable left midlung opacity, likely due to mass with postobstructive pneumonitis. Lungs elsewhere clear. No new opacity. Stable cardiac silhouette. Aortic atherosclerosis. Electronically Signed   By: Lowella Grip III M.D.   On: 07/08/2015 19:57   Dg Chest 2 View  07/02/2015  CLINICAL DATA:  Persistent cough, COPD, lung malignancy. Patient cyst has sustained an intracranial hemorrhage with right hemi [place] is EXAM: CHEST  2 VIEW COMPARISON:  Chest x-ray of June 26, 2015 FINDINGS: The right lung is adequately inflated and exhibits increased density in the infrahilar region. There is some overlap here of the Port-A-Cath reservoir. On the left there is persistent increased density in the perihilar region extending toward the lateral pleural surface. The heart is top-normal in size. There are post CABG changes. Three upper sternal wires are chronically broken. There is calcification in the wall of the aortic arch. There is no pleural effusion or pneumothorax. The Port-A-Cath appliance tip projects over the midportion of the SVC. IMPRESSION: 1. Subtle increased density at the right lung base suggests subsegmental atelectasis. Stable parenchymal density in the left mid lung and left perihilar region. 2. Stable post CABG changes.  No evidence of CHF. Electronically Signed   By: David  Martinique M.D.   On: 07/02/2015 09:40   Dg Chest 2 View  06/26/2015  CLINICAL DATA:  C/o chronic cough. 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 Rales heard in right lower lung. 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  EXAM: CHEST  2 VIEW COMPARISON:  06/25/2015 FINDINGS: There stable changes from prior CABG surgery. Cardiac silhouette is mildly enlarged. Focal irregular opacity in the posterior perihilar region of the left lung is stable. There are no new areas of lung opacification. No pleural effusion or pneumothorax. Right anterior chest wall Port-A-Cath is stable. Bony thorax is intact. IMPRESSION: 1. No acute cardiopulmonary disease. 2. No change from the prior study. Persistent focal opacity in the perihilar region of the left lung consistent with the reported squamous cell lung carcinoma. Electronically Signed   By: Lajean Manes M.D.   On: 06/26/2015 16:36   Dg Chest 2 View  06/25/2015  CLINICAL DATA:  Cough, left-sided weakness EXAM: CHEST  2 VIEW COMPARISON:  06/25/2015 FINDINGS: Cardiomediastinal silhouette is stable. Right IJ Port-A-Cath is unchanged in position. Persistent left upper lobe consolidation. Bony thorax is unremarkable. IMPRESSION: No significant change. Persistent left upper lobe consolidation. Stable right IJ Port-A-Cath position. Electronically Signed   By: Lahoma Crocker M.D.   On: 06/25/2015 16:54   Ct Head Wo Contrast  06/26/2015  CLINICAL DATA:  Slurred speech. EXAM: CT HEAD WITHOUT CONTRAST TECHNIQUE: Contiguous axial images were obtained from the base of the skull through the vertex  without intravenous contrast. COMPARISON:  MRI of June 14, 2015.  CT scan of June 13, 2015. FINDINGS: Bony calvarium appears intact.  Sphenoid sinusitis is noted. Mild diffuse cortical atrophy is noted. There is continued presence of left superior parietal intraparenchymal hemorrhage which now measures 21 x 11 mm and is slightly decreased in size compared to prior exam. Subarachnoid hemorrhage has resolved. There is continued surrounding vasogenic edema. No significant midline shift is noted. Ventricular size is unremarkable. IMPRESSION: Mild diffuse cortical atrophy. Continued presence of left parietal  intraparenchymal hemorrhage with surrounding vasogenic edema. This is improved compared to prior exam. Subarachnoid hemorrhage is no longer present. Electronically Signed   By: Marijo Conception, M.D.   On: 06/26/2015 14:29   Ct Head Wo Contrast  06/13/2015  CLINICAL DATA:  The woke at 5 a.m. with right-sided weakness. EXAM: CT HEAD WITHOUT CONTRAST TECHNIQUE: Contiguous axial images were obtained from the base of the skull through the vertex without intravenous contrast. COMPARISON:  None. FINDINGS: There is intra cerebral hemorrhage noted in the posterior left frontal lobe. Subarachnoid extension. Hemorrhage measures 2.4 x 2.2 x 1.6 cm for a volume of 4.2 mL. Mild surrounding vasogenic edema. No significant mass effect. No midline shift. No hydrocephalus. No acute calvarial abnormality. Air-fluid level in the left sphenoid sinus. Mastoid air cells are clear. IMPRESSION: Left posterior frontal intra cerebral hemorrhage with subarachnoid extension as described above. Critical Value/emergent results were called by telephone at the time of interpretation on 06/13/2015 at 10:02 am to Dr. Loura Pardon , who verbally acknowledged these results. Electronically Signed   By: Rolm Baptise M.D.   On: 06/13/2015 10:04   Mr Jodene Nam Head Wo Contrast  06/15/2015  ADDENDUM REPORT: 06/15/2015 08:11 ADDENDUM: With the rounded contour of the enhancing component of the left frontal lobe hematoma, follow-up until clearance recommended to exclude underlying vascular abnormality. Electronically Signed   By: Genia Del M.D.   On: 06/15/2015 08:11  06/15/2015  CLINICAL DATA:  75 year old diabetic male with history of lung cancer. Right-sided weakness. Subsequent encounter. EXAM: MRI HEAD WITHOUT CONTRAST MRA HEAD WITHOUT CONTRAST TECHNIQUE: Multiplanar, multiecho pulse sequences of the brain and surrounding structures were obtained without intravenous contrast. Angiographic images of the head were obtained using MRA technique without  contrast. COMPARISON:  06/13/2015 head CT.  07/28/2014 brain MR. FINDINGS: MRI HEAD FINDINGS Posterior left frontal lobe complex 2.5 x 2.2 x 2 cm hemorrhagic lesion with marked surrounding vasogenic edema with breakthrough into sulci with subarachnoid hemorrhage noted. Patient's history of lung cancer in addition to 6 mm rounded area of enhanced along the posterior margin of this hemorrhage suggests that this is most likely is related to a hemorrhagic metastatic lesion which has bled. Continued MR surveillance as hemorrhage clears is recommended. There may be a a second enhancing lesion within the anterior left parietal lobe with surrounding vasogenic edema. Venous infarct can have a similar appearance. The major dural sinuses appear patent. No acute thrombotic infarct separate from the above described findings. Remote small left parietal lobe, left frontal lobe, posterior left opercular and tiny right thalamic infarct. Mild small vessel disease changes. Moderate global atrophy without hydrocephalus. Opacification left sphenoid sinus with air-fluid level suggesting acute sinusitis. Mild mucosal thickening ethmoid sinus air cells and minimal mucosal thickening frontal sinuses. Bilateral mastoid air cell and middle ear opacification greater on the left without obstructing lesion of the eustachian tube noted. Post lens replacement without acute orbital abnormality noted. MRA HEAD FINDINGS MR angiogram does not  incorporate the left frontal lobe hemorrhage. Mild narrowing supraclinoid aspect internal carotid artery bilaterally with irregularity more notable on the left. Small infundibulum on the left posterior communicating artery level. Anterior circulation without medium or large size vessel significant stenosis or occlusion. Middle cerebral artery branch vessel irregularity bilaterally. Right vertebral artery ends in a posterior inferior cerebellar artery distribution. No significant narrowing left vertebral artery.  Moderate narrowing portions of the left posterior inferior cerebellar artery. Ectatic basilar artery without high-grade stenosis. Nonvisualized anterior inferior cerebellar arteries. Small left superior cerebellar artery. Posterior cerebral artery distal branch vessel narrowing bilaterally. IMPRESSION: MRI HEAD Posterior left frontal lobe complex 2.5 x 2.2 x 2 cm hemorrhagic lesion with marked surrounding vasogenic edema with breakthrough into sulci with subarachnoid hemorrhage noted. Patient's history of lung cancer in addition to 6 mm rounded area of enhanced along the posterior margin of this hemorrhage suggests that this is most likely is related to a hemorrhagic metastatic lesion which has bled. Continued MR surveillance as hemorrhage clears is recommended. There may be a a second enhancing lesion within the anterior left parietal lobe with surrounding vasogenic edema. Venous infarct can have a similar appearance. The major dural sinuses appear patent. Opacification left sphenoid sinus with air-fluid level suggesting acute sinusitis. Bilateral mastoid air cell and middle ear opacification greater on the left without obstructing lesion of the eustachian tube noted. MRA HEAD MR angiogram does not incorporate the left frontal lobe hemorrhage. Mild narrowing supraclinoid aspect internal carotid artery bilaterally with irregularity more notable on the left. Small infundibulum on the left posterior communicating artery level. Anterior circulation without medium or large size vessel significant stenosis or occlusion. Middle cerebral artery branch vessel irregularity bilaterally. Right vertebral artery ends in a posterior inferior cerebellar artery distribution. No significant narrowing left vertebral artery. Moderate narrowing portions of the left posterior inferior cerebellar artery. Ectatic basilar artery without high-grade stenosis. Nonvisualized anterior inferior cerebellar arteries. Small left superior cerebellar  artery. Posterior cerebral artery distal branch vessel narrowing bilaterally. Electronically Signed: By: Genia Del M.D. On: 06/14/2015 19:34   [name] GL Contrast  06/26/2015  CLINICAL DATA:  Initial evaluation for progressively garbled and slurred speech, with increased right-sided facial droop. EXAM: MRI HEAD WITHOUT AND WITH CONTRAST TECHNIQUE: Multiplanar, multiecho pulse sequences of the brain and surrounding structures were obtained without and with intravenous contrast. CONTRAST:  49m MULTIHANCE GADOBENATE DIMEGLUMINE 529 MG/ML IV SOLN COMPARISON:  Prior CT from earlier the same day as well as previous MRI from 06/14/2015. FINDINGS: Generalized cerebral atrophy with minimal chronic small vessel ischemic changes again noted, stable. Small remote lacunar infarct within the right thalamus. Previously identified posterior left frontal lobe complex hemorrhagic lesion again seen, overall likely decreased in size from previous. Lesion measures 13 x 21 x 16 mm on T2 weighted sequences. Surrounding vasogenic edema is similar. Breakthrough subarachnoid hemorrhage within the adjacent cortical sulci again seen, improved. There is increased peripheral enhancement about the margin of the hemorrhagic lesion, likely related to the evolving hematoma. Previously noted small hemorrhagic nodule is not as clearly delineated on this exam. Note again made of a second possible enhancing lesion within the anterior left parietal lobe along the dura, measuring approximately 9 x 11 x 11 mm. Surrounding vasogenic edema. No other new enhancing lesions. Immediately lateral and inferior to the dominant hemorrhagic lesion. There is a focus of abnormal restricted diffusion involving the cortical gray matter and underlying white matter of the posterior left frontal region, compatible with acute ischemic infarct (  series 4, image 35). Associated signal loss on ADC map. This appears to be more vascular distribution rather than seizure  related. Additional small sub cm focus of restricted diffusion more posteriorly within the left centrum semi ovale. No significant mass effect. Area of infarction closely approximates the hemorrhagic lesion can subarachnoid hemorrhage, but does not appear to be hemorrhagic in of itself. No other acute infarct. Gray-white matter differentiation otherwise maintained. Major intracranial vascular flow voids are preserved. No midline shift. No hydrocephalus. No extra-axial fluid collection. Again, major dural sinuses appear to be grossly patent. Craniocervical junction within normal limits. Visualized upper cervical spine unremarkable. Pituitary gland within normal limits. No acute abnormality about the globes and orbits. Patient is status post lens extraction bilaterally. Mucosal thickening with fluid level within the left sphenoid sinus, similar to prior. Mild scattered opacity within the anterior ethmoidal air cells. Bilateral mastoid effusions present. Visualize bone marrow within normal limits. Scalp soft tissues demonstrate no acute abnormality. IMPRESSION: 1. Acute ischemic left MCA territory infarct adjacent to the posterior left frontal hemorrhagic lesion. Vasospasm as an underlying etiology is suspected given the adjacent complex hemorrhagic lesion and subarachnoid hemorrhage. 2. Continued interval evolution of complex left frontal lobe hemorrhagic lesion with similar vasogenic edema. Associated breakthrough subarachnoid hemorrhage is improved. This lesion now demonstrates peripheral enhancement, which would be expected with an evolving hematoma. Previously noted small nodular focus of enhancement not well delineated on this exam. Again, continued MR surveillance at the hemorrhage clears is recommended to ensure no underlying lesion is present. 3. Additional 9 x 11 x 11 mm enhancing lesion within the anterior left parietal lobe, similar to prior. Attention at follow-up recommended. 4. Acute left sphenoid sinus  disease with bilateral mastoid effusions, similar to prior. Electronically Signed   By: Jeannine Boga M.D.   On: 06/26/2015 23:30   [name] XA Contrast  06/15/2015  ADDENDUM REPORT: 06/15/2015 08:11 ADDENDUM: With the rounded contour of the enhancing component of the left frontal lobe hematoma, follow-up until clearance recommended to exclude underlying vascular abnormality. Electronically Signed   By: Genia Del M.D.   On: 06/15/2015 08:11  06/15/2015  CLINICAL DATA:  75 year old diabetic male with history of lung cancer. Right-sided weakness. Subsequent encounter. EXAM: MRI HEAD WITHOUT CONTRAST MRA HEAD WITHOUT CONTRAST TECHNIQUE: Multiplanar, multiecho pulse sequences of the brain and surrounding structures were obtained without intravenous contrast. Angiographic images of the head were obtained using MRA technique without contrast. COMPARISON:  06/13/2015 head CT.  07/28/2014 brain MR. FINDINGS: MRI HEAD FINDINGS Posterior left frontal lobe complex 2.5 x 2.2 x 2 cm hemorrhagic lesion with marked surrounding vasogenic edema with breakthrough into sulci with subarachnoid hemorrhage noted. Patient's history of lung cancer in addition to 6 mm rounded area of enhanced along the posterior margin of this hemorrhage suggests that this is most likely is related to a hemorrhagic metastatic lesion which has bled. Continued MR surveillance as hemorrhage clears is recommended. There may be a a second enhancing lesion within the anterior left parietal lobe with surrounding vasogenic edema. Venous infarct can have a similar appearance. The major dural sinuses appear patent. No acute thrombotic infarct separate from the above described findings. Remote small left parietal lobe, left frontal lobe, posterior left opercular and tiny right thalamic infarct. Mild small vessel disease changes. Moderate global atrophy without hydrocephalus. Opacification left sphenoid sinus with air-fluid level suggesting acute  sinusitis. Mild mucosal thickening ethmoid sinus air cells and minimal mucosal thickening frontal sinuses. Bilateral mastoid air cell  and middle ear opacification greater on the left without obstructing lesion of the eustachian tube noted. Post lens replacement without acute orbital abnormality noted. MRA HEAD FINDINGS MR angiogram does not incorporate the left frontal lobe hemorrhage. Mild narrowing supraclinoid aspect internal carotid artery bilaterally with irregularity more notable on the left. Small infundibulum on the left posterior communicating artery level. Anterior circulation without medium or large size vessel significant stenosis or occlusion. Middle cerebral artery branch vessel irregularity bilaterally. Right vertebral artery ends in a posterior inferior cerebellar artery distribution. No significant narrowing left vertebral artery. Moderate narrowing portions of the left posterior inferior cerebellar artery. Ectatic basilar artery without high-grade stenosis. Nonvisualized anterior inferior cerebellar arteries. Small left superior cerebellar artery. Posterior cerebral artery distal branch vessel narrowing bilaterally. IMPRESSION: MRI HEAD Posterior left frontal lobe complex 2.5 x 2.2 x 2 cm hemorrhagic lesion with marked surrounding vasogenic edema with breakthrough into sulci with subarachnoid hemorrhage noted. Patient's history of lung cancer in addition to 6 mm rounded area of enhanced along the posterior margin of this hemorrhage suggests that this is most likely is related to a hemorrhagic metastatic lesion which has bled. Continued MR surveillance as hemorrhage clears is recommended. There may be a a second enhancing lesion within the anterior left parietal lobe with surrounding vasogenic edema. Venous infarct can have a similar appearance. The major dural sinuses appear patent. Opacification left sphenoid sinus with air-fluid level suggesting acute sinusitis. Bilateral mastoid air cell and  middle ear opacification greater on the left without obstructing lesion of the eustachian tube noted. MRA HEAD MR angiogram does not incorporate the left frontal lobe hemorrhage. Mild narrowing supraclinoid aspect internal carotid artery bilaterally with irregularity more notable on the left. Small infundibulum on the left posterior communicating artery level. Anterior circulation without medium or large size vessel significant stenosis or occlusion. Middle cerebral artery branch vessel irregularity bilaterally. Right vertebral artery ends in a posterior inferior cerebellar artery distribution. No significant narrowing left vertebral artery. Moderate narrowing portions of the left posterior inferior cerebellar artery. Ectatic basilar artery without high-grade stenosis. Nonvisualized anterior inferior cerebellar arteries. Small left superior cerebellar artery. Posterior cerebral artery distal branch vessel narrowing bilaterally. Electronically Signed: By: Genia Del M.D. On: 06/14/2015 19:34   Dg Chest Port 1 View  07/06/2015  CLINICAL DATA:  COUGH.HX MI,CA EXAM: PORTABLE CHEST 1 VIEW COMPARISON:  07/02/2015 FINDINGS: Heart size is enlarged. Right-sided Port-A-Cath tip to the level of the lower superior vena cava. There is patchy density in the left upper lobe, stable compared with prior study. Mild interstitial edema is noted. IMPRESSION: 1. Persistent left upper lobe density. 2. Mild interstitial edema. Electronically Signed   By: Nolon Nations M.D.   On: 07/06/2015 14:09   Dg Chest Port 1 View  06/24/2015  CLINICAL DATA:  Cough and congestion. EXAM: PORTABLE CHEST 1 VIEW COMPARISON:  June 18, 2015 FINDINGS: There is persistent airspace consolidation in the left upper lobe with volume loss. Lungs elsewhere clear. Heart is upper normal in size with pulmonary vascularity within normal limits. Port-A-Cath tip is in the superior vena cava near the cavoatrial junction, stable. No pneumothorax. No adenopathy  evident. Patient is status post coronary artery bypass grafting. IMPRESSION: Persistent left upper lobe airspace consolidation with volume loss. No change from 3 days prior. Stable cardiac silhouette. Electronically Signed   By: Lowella Grip III M.D.   On: 07/09/2015 07:25   Dg Chest Port 1 View  06/18/2015  CLINICAL DATA:  Cough EXAM: PORTABLE CHEST  1 VIEW COMPARISON:  06/16/2015 FINDINGS: Stable right jugular Port-A-Cath. Low volumes with bibasilar atelectasis. Left mid lung dense pulmonary opacity is stable. No pneumothorax. IMPRESSION: Stable left mid lung dense pulmonary opacity. Bibasilar atelectasis. Electronically Signed   By: Marybelle Killings M.D.   On: 06/18/2015 15:16   Dg Chest Port 1 View  06/16/2015  CLINICAL DATA:  75 year old male with lung cancer. No chest complaint at this time. EXAM: PORTABLE CHEST 1 VIEW COMPARISON:  Chest radiograph dated 06/15/2015 FINDINGS: There has been interval removal of the endotracheal and enteric tube. The stable area of density is again noted in the left mid lung field. Multiple surgical clips arms in this area. No new consolidation identified. Overall there is improved aeration of the left lower lung field since the prior study. The right lung is clear. There is no pleural effusion or pneumothorax. Stable cardiac silhouette. Median sternotomy wires and CABG vascular clips noted. Right pectoral Port-A-Cath with tip in stable positioning. No acute osseous pathology. IMPRESSION: Interval removal of the endotracheal and enteric tube. Stable appearing density in the left mid lung field with overall improvement of the aeration of the left lung. Electronically Signed   By: Anner Crete M.D.   On: 06/16/2015 19:54   Dg Chest Port 1 View  06/15/2015  CLINICAL DATA:  Acute respiratory failure EXAM: PORTABLE CHEST 1 VIEW COMPARISON:  Portable chest x-ray of 06/14/2015 FINDINGS: Aeration of the lungs has improved somewhat. The tip of the endotracheal tube appears to be  approximately 3.8 cm above the carina. Opacity in the left perihilar and left mid lung is unchanged. Cardiomegaly is stable. Right sided Port-A-Cath is unchanged in position, and right IJ central venous line is unchanged. IMPRESSION: Slightly better aeration. Endotracheal tube tip 3.8 cm above the carina. Electronically Signed   By: Ivar Drape M.D.   On: 06/15/2015 08:05   Dg Chest Port 1 View  06/14/2015  CLINICAL DATA:  Respiratory failure, history of coronary artery disease, GERD, diabetes, lung cancer EXAM: PORTABLE CHEST 1 VIEW COMPARISON:  06/13/2015 FINDINGS: Cardiomediastinal silhouette is stable. Status post median sternotomy. Persistent opacity left hilum and left perihilar region. Post radiation fibrosis left upper lobe again noted. Left basilar atelectasis. Stable endotracheal and NG tube position. Right IJ Port-A-Cath is unchanged in position. Stable right IJ central line. No pneumothorax. No pulmonary edema. IMPRESSION: Stable support apparatus. Persistent opacity left hilum and left perihilar region. Postradiation fibrosis in left upper lobe again noted. Left basilar atelectasis. No pulmonary edema. No pneumothorax. Status post median sternotomy. Electronically Signed   By: Lahoma Crocker M.D.   On: 06/14/2015 08:15   Dg Chest Port 1 View  06/13/2015  CLINICAL DATA:  75 year old male with endotracheal tube placement. EXAM: PORTABLE CHEST 1 VIEW COMPARISON:  Radiograph dated 06/13/2015 FINDINGS: Endotracheal tube with tip approximately 4 cm above the carina. An enteric tube is partially visualized coursing the left hemi abdomen with tip beyond the inferior margin of the image. There is a right IJ central line with tip over the mediastinum. Right pectoral infusion catheter is also noted. There is persistent opacity in the left mid lung field. Multiple surgical clips in the left lower lung field similar to prior study. There is no pneumothorax. The right costophrenic angle has been excluded from the  image. The cardiac border is silhouetted. No acute osseous pathology identified. IMPRESSION: Endotracheal tube above the carina. Stable appearing postsurgical changes and persistent opacity in the left mid lung field. Electronically Signed   By:  Anner Crete M.D.   On: 06/13/2015 21:05   Dg Chest Port 1 View  06/13/2015  CLINICAL DATA:  Respiratory failure and cough. History of left lung squamous carcinoma. EXAM: PORTABLE CHEST 1 VIEW COMPARISON:  05/27/2015 and prior radiographs.  05/03/2015 PET CT FINDINGS: Cardiomegaly, CABG changes and right Port-A-Cath with tip overlying the lower SVC again noted. Left mid lung opacity is stable to slightly increased in size There is no evidence of pneumothorax or pleural effusion. No other changes identified. IMPRESSION: Stable to slightly increased left lung opacity which may represent posttreatment changes. No other significant change. Electronically Signed   By: Margarette Canada M.D.   On: 06/13/2015 18:10   Dg Abd Portable 1v  06/13/2015  CLINICAL DATA:  Orogastric tube placement EXAM: PORTABLE ABDOMEN - 1 VIEW COMPARISON:  PET-CT May 03, 2015 FINDINGS: Orogastric tube tip and side port are in the stomach. Bowel gas pattern is unremarkable. No bowel dilatation or air-fluid level suggesting obstruction. No free air. Moderate stool is noted in the colon. IMPRESSION: Orogastric tube tip and side port in stomach. Bowel gas pattern unremarkable. No free air evident. Electronically Signed   By: Lowella Grip III M.D.   On: 06/13/2015 20:05   Dg Swallowing Func-speech Pathology  06/29/2015  Objective Swallowing Evaluation: Type of Study: MBS-Modified Barium Swallow Study Patient Details Name: PELHAM HENNICK MRN: [id number] Date of Birth: [birth year] Today's Date: 06/29/2015 Time: SLP Start Time (ACUTE ONLY): 0910-SLP Stop Time (ACUTE ONLY): 0930 SLP Time Calculation (min) (ACUTE ONLY): 20 min Past Medical History: Past Medical History Diagnosis Date . Coronary artery  disease  . Pneumonia 2000 . Arthritis  . 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 Bruises easily  . GERD (gastroesophageal reflux disease)    takes Omeprazole daily as needed for stomach pain . Blood transfusion 2001 . Diabetes mellitus    takes Metformin daily; . Impaired hearing    bil hearing aide . Macular degeneration    being watched for this but hasn't been "completely" diagnosed . Myocardial infarction ([place]) 2001 . Cancer [facility])  Past Surgical History: Past Surgical History Procedure Laterality Date . Coronary artery bypass graft  2001   4 vessels . Cardiac catheterization  2001 . Tonsillectomy     as a child . Colonoscopy   . Cataract extraction     bilateral . Lumbar laminectomy/decompression microdiscectomy  12/19/2010   Procedure: LUMBAR LAMINECTOMY/DECOMPRESSION MICRODISCECTOMY;  Surgeon: Elaina Hoops;  Location: [place] [facility]UR[facility] ORS;  Service: Neurosurgery;  Laterality: N/A;  Lumbar three-four, four-five decompressive lumbar laminectomy . Joint replacement  right tkr . Eye surgery     cataracts . Back surgery     l4 5 . Portacath placement N/A 05/20/2014   Procedure: INSERTION PORT-A-CATH;  Surgeon: Nestor Lewandowsky, MD;  Location: [facility] ORS;  Service: General;  Laterality: N/A; HPI: 75 y.o. male patient with PMH: GERD, pna, CAD, DM, MI, cancer and chronic admitted with right-sided hemi-plegia. CT of the head revealed left frontal intracerebral hemorrhage, with subarachnoid hemorrhage as well adjacent to it. Intubated approximately 24-30 hours (extubated 6/6). 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  Objective swallow study ordered to determine safest diet consistencies due to change in function.  No Data Recorded Assessment / Plan / Recommendation CHL IP CLINICAL IMPRESSIONS 06/29/2015 Therapy Diagnosis Moderate oral dysphagia, Moderate pharyngeal dysphagia  Clinical Impression Pt presents with a moderate oropharyngeal dysphagia with both sensory  and motor  components.  As mentioned on bedside swallow evaluation, pt presents with right sided labial, lingual, and buccal weakness which impacts his ability to contain and transit boluses effectively.  This in combination with decreased pharyngeal sensation result in premature spillage of materials into the pharynx with swallow response most consistently triggered at the pyriforms.  Delay in swallow response resulted in aspiration of thin liquids with delayed sensation.  Congested cough which occurred several seconds after aspiration event was ineffective for clearing aspirates from the airway.  Deep penetration was noted x1 over multiple boluses of nectar thick liquids which pt was able to sense and clear from the airway before materials passed below the vocal cords.  Poor base of tongue strength and decreased hyolaryngeal excursion also contributed to moderate pharyngeal residuals post swallow (vallecular residue > pyriforms).  Residue cleared to minimal-mild with cues for extra swallows. Recommend that pt initiate a Dys 1 diet with nectar thick liquids and full supervision for use of swallowing precautions.   Prognosis for advancement good with SLP intervention for pharyngeal strengthening exercises and management of safe diet progression.   Impact on safety and function Moderate aspiration risk     Prognosis 06/29/2015 Prognosis for Safe Diet Advancement Good Barriers to Reach Goals -- Barriers/Prognosis Comment -- CHL IP DIET RECOMMENDATION 06/29/2015 SLP Diet Recommendations Dysphagia 1 (Puree) solids;Nectar thick liquid Liquid Administration via Cup Medication Administration Crushed with puree Compensations Slow rate;Small sips/bites;Clear throat intermittently;Multiple dry swallows after each bite/sip Postural Changes --   No flowsheet data found.         CHL IP ORAL PHASE 06/29/2015 Oral Phase Impaired Oral - Pudding Teaspoon -- Oral - Pudding Cup -- Oral - Honey Teaspoon -- Oral - Honey Cup -- Oral - Nectar Teaspoon  Weak lingual manipulation;Right anterior bolus loss;Reduced posterior propulsion;Lingual/palatal residue;Delayed oral transit;Decreased bolus cohesion;Premature spillage Oral - Nectar Cup Right anterior bolus loss;Weak lingual manipulation;Lingual/palatal residue;Delayed oral transit;Decreased bolus cohesion;Premature spillage;Reduced posterior propulsion Oral - Nectar Straw Weak lingual manipulation;Lingual/palatal residue;Decreased bolus cohesion;Premature spillage Oral - Thin Teaspoon Right anterior bolus loss;Weak lingual manipulation;Reduced posterior propulsion;Lingual/palatal residue;Premature spillage;Delayed oral transit;Decreased bolus cohesion Oral - Thin Cup Weak lingual manipulation;Premature spillage;Lingual/palatal residue;Reduced posterior propulsion;Right anterior bolus loss;Delayed oral transit;Decreased bolus cohesion Oral - Thin Straw -- Oral - Puree Right anterior bolus loss;Weak lingual manipulation;Reduced posterior propulsion;Lingual/palatal residue;Delayed oral transit;Decreased bolus cohesion;Premature spillage Oral - Mech Soft -- Oral - Regular -- Oral - Multi-Consistency -- Oral - Pill -- Oral Phase - Comment --  CHL IP PHARYNGEAL PHASE 06/29/2015 Pharyngeal Phase Impaired Pharyngeal- Pudding Teaspoon -- Pharyngeal -- Pharyngeal- Pudding Cup -- Pharyngeal -- Pharyngeal- Honey Teaspoon -- Pharyngeal -- Pharyngeal- Honey Cup -- Pharyngeal -- Pharyngeal- Nectar Teaspoon Delayed swallow initiation-vallecula;Reduced anterior laryngeal mobility;Reduced laryngeal elevation;Reduced tongue base retraction;Reduced airway/laryngeal closure;Pharyngeal residue - posterior pharnyx;Pharyngeal residue - valleculae Pharyngeal -- Pharyngeal- Nectar Cup Delayed swallow initiation-pyriform sinuses;Reduced anterior laryngeal mobility;Reduced laryngeal elevation;Reduced airway/laryngeal closure;Reduced tongue base retraction;Pharyngeal residue - valleculae;Pharyngeal residue - posterior  pharnyx;Penetration/Aspiration during swallow Pharyngeal Material enters airway, CONTACTS cords and then ejected out Pharyngeal- Nectar Straw Reduced anterior laryngeal mobility;Reduced laryngeal elevation;Reduced airway/laryngeal closure;Reduced tongue base retraction;Pharyngeal residue - valleculae;Pharyngeal residue - posterior pharnyx;Pharyngeal residue - pyriform Pharyngeal -- Pharyngeal- Thin Teaspoon Delayed swallow initiation-vallecula;Reduced anterior laryngeal mobility;Reduced laryngeal elevation;Reduced airway/laryngeal closure;Reduced tongue base retraction;Pharyngeal residue - valleculae;Pharyngeal residue - pyriform;Penetration/Aspiration during swallow Pharyngeal Material enters airway, CONTACTS cords and then ejected out Pharyngeal- Thin Cup Delayed swallow initiation-pyriform sinuses;Reduced anterior laryngeal mobility;Reduced laryngeal elevation;Reduced airway/laryngeal closure;Reduced tongue base retraction;Penetration/Aspiration  during swallow;Pharyngeal residue - valleculae;Pharyngeal residue - pyriform Pharyngeal Material enters airway, passes BELOW cords without attempt by patient to eject out (silent aspiration) Pharyngeal- Thin Straw -- Pharyngeal -- Pharyngeal- Puree Delayed swallow initiation-vallecula;Reduced anterior laryngeal mobility;Reduced laryngeal elevation;Reduced airway/laryngeal closure;Reduced tongue base retraction;Pharyngeal residue - valleculae;Pharyngeal residue - pyriform Pharyngeal -- Pharyngeal- Mechanical Soft -- Pharyngeal -- Pharyngeal- Regular -- Pharyngeal -- Pharyngeal- Multi-consistency -- Pharyngeal -- Pharyngeal- Pill -- Pharyngeal -- Pharyngeal Comment --  No flowsheet data found. No flowsheet data found. Page, [name] 06/29/2015, 3:21 PM                CBC  Recent Labs Lab 07/04/15 1725  07/05/15 0443 07/06/15 0457 07/07/15 0530 07/08/15 0430 07/09/15 0500  WBC 4.1  --  5.4 5.7 5.6  --  6.3  HGB 8.2*  < > 8.9* 8.9* 8.8* 8.9* 9.1*  HCT 26.4*  < >  28.9* 28.4* 28.5* 28.7* 29.4*  PLT 199  --  193 193 183  --  216  MCV 87.7  --  85.5 85.3 85.3  --  88.0  MCH 27.2  --  26.3 26.7 26.3  --  27.2  MCHC 31.1  --  30.8 31.3 30.9  --  31.0  RDW 17.6*  --  17.8* 17.3* 17.4*  --  17.3*  LYMPHSABS  --   --  0.7  --   --   --  0.9  MONOABS  --   --  0.7  --   --   --  1.0  EOSABS  --   --  0.2  --   --   --  0.2  BASOSABS  --   --  0.0  --   --   --  0.0  < > = values in this interval not displayed.  Chemistries   Recent Labs Lab 07/05/15 0443 07/07/15 1430 07/09/15 0500  NA 134* 136 135  K 4.5 5.1 4.5  CL 105 105 103  CO2 '22 22 22  '$ GLUCOSE 128* 92 99  BUN <5* 10 12  CREATININE 1.14 1.51* 1.36*  CALCIUM 9.6 9.8 9.6  AST  --  20  --   ALT  --  16*  --   ALKPHOS  --  102  --   BILITOT  --  0.5  --    ------------------------------------------------------------------------------------------------------------------ estimated creatinine clearance is 51.5 mL/min (by C-G formula based on Cr of 1.36). ------------------------------------------------------------------------------------------------------------------ No results for input(s): HGBA1C in the last 72 hours. ------------------------------------------------------------------------------------------------------------------ No results for input(s): CHOL, HDL, LDLCALC, TRIG, CHOLHDL, LDLDIRECT in the last 72 hours. ------------------------------------------------------------------------------------------------------------------ No results for input(s): TSH, T4TOTAL, T3FREE, THYROIDAB in the last 72 hours.  Invalid input(s): FREET3 ------------------------------------------------------------------------------------------------------------------ No results for input(s): VITAMINB12, FOLATE, FERRITIN, TIBC, IRON, RETICCTPCT in the last 72 hours.  Coagulation profile No results for input(s): INR, PROTIME in the last 168 hours.  No results for input(s): DDIMER in the last 72  hours.  Cardiac Enzymes No results for input(s): CKMB, TROPONINI, MYOGLOBIN in the last 168 hours.  Invalid input(s): CK ------------------------------------------------------------------------------------------------------------------ Invalid input(s): POCBNP   CBG:  Recent Labs Lab 07/09/15 1316 07/09/15 1738 07/09/15 1816 07/09/15 2137 07/10/15 0658  GLUCAP 115* 134* 139* 120* 134*       Studies: Dg Chest 1 View  07/08/2015  CLINICAL DATA:  Altered mental status for 1 day. History of lung carcinoma EXAM: CHEST 1 VIEW COMPARISON:  Chest radiograph July 06, 2015; PET-CT May 03, 2015 FINDINGS: There is again noted opacity  in the left mid lung region extending to the left hilum, likely due to mass with postobstructive pneumonitis. Right lung clear. Heart is upper normal in size with pulmonary vascularity within normal limits. Calcification noted in the aortic arch. Port-A-Cath tip in superior vena cava. No pneumothorax. No bone lesions evident. IMPRESSION: Stable left midlung opacity, likely due to mass with postobstructive pneumonitis. Lungs elsewhere clear. No new opacity. Stable cardiac silhouette. Aortic atherosclerosis. Electronically Signed   By: Lowella Grip III M.D.   On: 07/08/2015 19:57      Lab Results  Component Value Date   HGBA1C 6.8* 06/14/2015   HGBA1C 7.4* 04/17/2015   HGBA1C 8.0* 05/22/2013   Lab Results  Component Value Date   LDLCALC 145* 06/14/2015   CREATININE 1.36* 07/09/2015       Scheduled Meds: . amiodarone  200 mg Oral Daily  . antiseptic oral rinse  7 mL Mouth Rinse BID  . atorvastatin  40 mg Oral q1800  . diclofenac sodium  2 g Topical QID  . diltiazem  60 mg Oral BID  . insulin aspart  0-24 Units Subcutaneous TID AC & HS  . iron polysaccharides  150 mg Oral Daily  . lactose free nutrition  237 mL Oral TID WC  . levETIRAcetam  1,000 mg Intravenous Q12H  . metFORMIN  1,000 mg Oral Q breakfast  . metFORMIN  500 mg Oral Q supper   . pantoprazole sodium  40 mg Oral Daily  . rivaroxaban  15 mg Oral Q supper  . vitamin B-12  1,000 mcg Oral Daily   Continuous Infusions: . sodium chloride 50 mL (07/09/15 2104)     LOS: 19 days    Time spent: >20 [place] [facility] Pager [phone]. If 7PM-7AM, please contact night-coverage at [email or web address], password [facility] 07/10/2015, 9:10 AM  LOS: 19 days

## 2015-07-10 NOTE — Discharge Instructions (Addendum)
Information on my medicine - XARELTO (Rivaroxaban)  This medication education was reviewed with me or my healthcare representative as part of my discharge preparation.   Why was Xarelto prescribed for you? Xarelto was prescribed for you to reduce the risk of a blood clot forming that can cause a stroke if you have a medical condition called atrial fibrillation (a type of irregular heartbeat).  What do you need to know about xarelto ? Take your Xarelto 15 mg ONCE DAILY at the same time every day with your evening meal. If you have difficulty swallowing the tablet whole, you may crush it and mix in applesauce just prior to taking your dose.  Take Xarelto exactly as prescribed by your doctor and DO NOT stop taking Xarelto without talking to the doctor who prescribed the medication.  Stopping without other stroke prevention medication to take the place of Xarelto may increase your risk of developing a clot that causes a stroke.  Refill your prescription before you run out.  After discharge, you should have regular check-up appointments with your healthcare provider that is prescribing your Xarelto.  In the future your dose may need to be changed if your kidney function or weight changes by a significant amount.  What do you do if you miss a dose? If you are taking Xarelto ONCE DAILY and you miss a dose, take it as soon as you remember on the same day then continue your regularly scheduled once daily regimen the next day. Do not take two doses of Xarelto at the same time or on the same day.   Important Safety Information A possible side effect of Xarelto is bleeding. You should call your healthcare provider right away if you experience any of the following: ? Bleeding from an injury or your nose that does not stop. ? Unusual colored urine (red or dark brown) or unusual colored stools (red or black). ? Unusual bruising for unknown reasons. ? A serious fall or if you hit your head (even  if there is no bleeding).  Some medicines may interact with Xarelto and might increase your risk of bleeding while on Xarelto. To help avoid this, consult your healthcare provider or pharmacist prior to using any new prescription or non-prescription medications, including herbals, vitamins, non-steroidal anti-inflammatory drugs (NSAIDs) and supplements.  This website has more information on Xarelto: [email or web address]  Inpatient Rehab Discharge Instructions  Ivan Beasley Discharge date and time: No discharge date for patient encounter.   Activities/Precautions/ Functional Status: Activity: As tolerated Diet: Dysphasia 1 nectar liquids Wound Care: as directed Functional status:  ___ No restrictions     ___ Walk up steps independently ___ 24/7 supervision/assistance   ___ Walk up steps with assistance ___ Intermittent supervision/assistance  ___ Bathe/dress independently ___ Walk with walker     ___ Bathe/dress with assistance ___ Walk Independently    ___ Shower independently ___ Walk with assistance    ___ Shower with assistance ___ No alcohol     ___ Return to work/school ________  Special Instructions:    My questions have been answered and I understand these instructions. I will adhere to these goals and the provided educational materials after my discharge from the hospital.  Patient/Caregiver Signature _______________________________ Date __________  Clinician Signature _______________________________________ Date __________  Please bring this form and your medication list with you to all your follow-up doctor's appointments. Inpatient Rehab Discharge Instructions  Ivan Beasley Discharge date and time: No discharge date for patient encounter.   Activities/Precautions/  Functional Status: Activity: as tolerated Diet: Dysphagia 1 nectar liquids Wound Care: as directed Functional status:  ___ No restrictions     ___ Walk up steps independently ___ 24/7  supervision/assistance   ___ Walk up steps with assistance ___ Intermittent supervision/assistance  ___ Bathe/dress independently ___ Walk with walker     _x__ Bathe/dress with assistance ___ Walk Independently    ___ Shower independently ___ Walk with assistance    ___ Shower with assistance ___ No alcohol     ___ Return to work/school ________  Special Instructions:    My questions have been answered and I understand these instructions. I will adhere to these goals and the provided educational materials after my discharge from the hospital.  Patient/Caregiver Signature _______________________________ Date __________  Clinician Signature _______________________________________ Date __________  Please bring this form and your medication list with you to all your follow-up doctor's appointments.

## 2015-07-10 NOTE — Consult Note (Signed)
Consultation Note Date: 07/10/2015   Patient Name: Ivan Beasley  DOB: 10-03-1940  MRN: [id number]  Age / Sex: 75 y.o., male  PCP: Albina Billet, MD Referring Physician: Bonnell Public, MD  Reason for Consultation: Establishing goals of care, Non pain symptom management and Psychosocial/spiritual support  HPI/Patient Profile: 75 y.o. male  with past medical history of Squamous cell lung cancer diagnosed in 2016 now with recurrence of disease, coronary artery disease, arthritis, chronic back pain with surgical intervention, history of pneumonia GERD, diabetes, impaired hearing with bilateral hearing aids, macular degeneration, history of MI with CABG in 2001 admitted on 06/25/2015 with altered mental status. Patient was [facility] to [facility] and then transferred to [facility] after an intracranial hemorrhage was revealed. He was on the general medical floor from 06/13/2015-07/04/2015. On 07/01/2015 he was admitted to CIR. On 07/06/2015 patient had acute onset of weakness and slurred speech; repeat CT scan revealed new left MCA stroke. Also referenced on scan were new lesions indicative of potential brain metastases..   Clinical Assessment and Goals of Care: Met briefly with patient and wife and then wife alone for further discussions of goals of care. Patient currently working with occupational therapy. Mood labile, he became tearful when I introduced myself and told him that I was sorry that he was in the hospital. His wife reports that she sees him more confused lately particularly as the day goes on, as well as being weaker. Per chart review his physical therapy schedule has had to be adjusted to compensate for his weakness secondary to new stroke on 07/06/2015. Wife shares that they had made advance care planning decisions in the past and that he would not want aggressive care such as intubation and  CPR, defibrillation. She also views him as "not having much time left". She states that he has told her he would not pursue chemotherapy, but is not sure if radiation treatment was offered to him how he would feel about this. He is currently being followed by an oncologist on the [place] that she calls "Dr. Jacinto Reap". She would like his input as to further treatment options in the setting of his recent intracranial hemorrhage and left MCA stroke  NEXT OF KIN wife Ivan Beasley    SUMMARY OF RECOMMENDATIONS   DNR/DNI Patient would not wish to pursue chemotherapy but to date has not verbalized willingness to undergo radiation if offered It would be beneficial to family for patient's oncologist, "Dr. Jacinto Reap", to weigh in on patient's prognosis and treatment options for underlying squamous cell carcinoma in the setting of new intracranial hemorrhage as well as left MCA stroke, new lesions potentially metastatic disease to the brain At this point wishes to continue with physical therapy with [facility] to improve mobility to whatever extent they can Wife reports after this she would likely bring him home with family support Did introduce the concept of hospice support in the home  Code Status/Advance Care Planning:  DNR    Symptom Management:  Cough: Patient's had a dry nonproductive cough for a while. He has tried [employer] in the past with only minimal response. Patient may benefit from Hycodan syrup at low doses so as not to interfere with mental status evaluation and/or  low-dose oxycodone by mouth. Patient has a history of chronic back pain, oxycodone may be beneficial to targeting both of these symptoms  Palliative Prophylaxis:   Aspiration, Delirium Protocol, Eye Care, Frequent Pain Assessment and Oral Care  Additional Recommendations (Limitations, Scope, Preferences):  No Artificial Feeding, No Chemotherapy, No Surgical Procedures and No Tracheostomy  Psycho-social/Spiritual:     Desire for further Chaplaincy support:no  Additional Recommendations: Grief/Bereavement Support  Prognosis:   < 3 months in the setting of metastatic squamous cell lung cancer now with new coagulopathy, intracranial hemorrhage and left MCA stroke; MRI of the brain also reflects new lesions indicative of brain metastases  Discharge Planning: To Be Determined      Primary Diagnoses: Present on Admission:  . ICH (intracerebral hemorrhage) (HCC)- L frontal ICH . CKD (chronic kidney disease), stage III . Mild chronic obstructive pulmonary disease (HCC) . Anemia  I have reviewed the medical record, interviewed the patient and family, and examined the patient. The following aspects are pertinent.  Past Medical History  Diagnosis Date  . Coronary artery disease   . Pneumonia 2000  . Arthritis   . 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 Bruises easily   . GERD (gastroesophageal reflux disease)     takes Omeprazole daily as needed for stomach pain  . Blood transfusion 2001  . Diabetes mellitus     takes Metformin daily;  . Impaired hearing     bil hearing aide  . Macular degeneration     being watched for this but hasn't been "completely" diagnosed  . Myocardial infarction (HCC) 2001  . Cancer [facility])    Social History   Social History  . Marital Status: Married    Spouse Name: N/A  . Number of Children: N/A  . Years of Education: N/A   Social History Main Topics  . Smoking status: Former Smoker -- 40 years    Types: Cigarettes    Quit date: 05/19/1999  . Smokeless tobacco: Never Used     Comment: 2001  . Alcohol Use: No  . Drug Use: No  . Sexual Activity: No   Other Topics Concern  . Not on file   Social History Narrative   Family History  Problem Relation Age of Onset  . Anesthesia problems Neg Hx   . Hypotension Neg Hx   . Malignant hyperthermia Neg Hx   . Pseudochol deficiency Neg Hx   . Diabetes type II Other    Scheduled Meds: .  amiodarone  200 mg Oral Daily  . antiseptic oral rinse  7 mL Mouth Rinse BID  . atorvastatin  40 mg Oral q1800  . diclofenac sodium  2 g Topical QID  . diltiazem  60 mg Oral BID  . insulin aspart  0-24 Units Subcutaneous TID AC & HS  . iron polysaccharides  150 mg Oral Daily  . lactose free nutrition  237 mL Oral TID WC  . levETIRAcetam  1,000 mg Intravenous Q12H  . metFORMIN  1,000 mg Oral Q breakfast  . metFORMIN  500 mg Oral Q supper  . pantoprazole sodium  40 mg Oral Daily  . rivaroxaban  15 mg Oral Q supper  . vitamin B-12  1,000 mcg  Oral Daily   Continuous Infusions: . sodium chloride 50 mL (07/09/15 2104)   PRN Meds:.acetaminophen **OR** acetaminophen, albuterol, ALPRAZolam, diphenoxylate-atropine, feeding supplement (ENSURE ENLIVE), food thickener, guaiFENesin-dextromethorphan, MUSCLE RUB, ondansetron **OR** ondansetron (ZOFRAN) IV, oxyCODONE, RESOURCE THICKENUP CLEAR, sodium chloride flush Medications Prior to Admission:  Prior to Admission medications   Medication Sig Start Date End Date Taking? Authorizing Provider  albuterol (PROVENTIL) (2.5 MG/3ML) 0.083% nebulizer solution Take 3 mLs (2.5 mg total) by nebulization every 6 (six) hours as needed for wheezing or shortness of breath. 04/23/15   Demetrios Loll, MD  ALPRAZolam Duanne Moron) 0.25 MG tablet Take 1 tablet (0.25 mg total) by mouth 3 (three) times daily as needed for anxiety. 04/23/15   Demetrios Loll, MD  amiodarone (PACERONE) 200 MG tablet Take 1 tablet (200 mg total) by mouth daily. 04/23/15   Demetrios Loll, MD  amoxicillin-clavulanate (AUGMENTIN) 875-125 MG tablet Take 1 tablet by mouth 2 (two) times daily. 05/31/15   Evlyn Kanner, NP  apixaban (ELIQUIS) 5 MG TABS tablet Take 1 tablet (5 mg total) by mouth 2 (two) times daily. 04/23/15   Demetrios Loll, MD  benzonatate (TESSALON) 100 MG capsule TAKE 1 CAPSULE (100 MG TOTAL) BY MOUTH 3 (THREE) TIMES DAILY AS NEEDED FOR COUGH. 01/12/15   Forest Gleason, MD  bismuth subsalicylate (KAOPECTATE) 262  MG/15ML suspension Take 30 mLs by mouth 3 (three) times daily as needed. 10/28/14   Forest Gleason, MD  Blood Glucose Calibration (TRUETEST CONTROL LEVEL 1) LIQD Use as directed. Reported on 04/17/2015 11/10/13   Historical Provider, MD  diltiazem (CARDIZEM) 60 MG tablet Take 60 mg by mouth 2 (two) times daily. 05/21/15   Historical Provider, MD  diphenoxylate-atropine (LOMOTIL) 2.5-0.025 MG tablet TAKE 1 TABLET EVERY 4 HOURS AS NEEDED FOR DIARRHEA /LOOSE STOOL 04/16/15   Lloyd Huger, MD  glimepiride (AMARYL) 1 MG tablet Take 1 mg by mouth daily.  02/06/14   Historical Provider, MD  glucose blood test strip Use as directed. 11/10/13   Historical Provider, MD  Lancet Devices (TRUEDRAW LANCING DEVICE) MISC Use as directed. 11/10/13   Historical Provider, MD  lidocaine-prilocaine (EMLA) cream Apply 1 application topically See admin instructions. 1 application to site area topically as directed. 05/21/14   Historical Provider, MD  magnesium oxide (MAG-OX) 400 MG tablet Take 400 mg by mouth daily.      Historical Provider, MD  Melatonin 5 MG CAPS Take 1 capsule (5 mg total) by mouth at bedtime as needed. 06/03/14   Forest Gleason, MD  metFORMIN (GLUCOPHAGE) 500 MG tablet Take 500-1,000 mg by mouth See admin instructions. Take 1000 mg by mouth in the morning,and take 500 mg by mouth every night at bedtime.    Historical Provider, MD  omeprazole (PRILOSEC) 40 MG capsule Take 40 mg by mouth daily.     Historical Provider, MD  oxyCODONE (ROXICODONE) 5 MG immediate release tablet Take 1 tablet (5 mg total) by mouth every 6 (six) hours as needed for severe pain. 10/14/14   Forest Gleason, MD  predniSONE (DELTASONE) 10 MG tablet Take 6 tablets (60 mg total) by mouth daily with breakfast. Taper by '10mg'$  every day until complete 05/31/15   Evlyn Kanner, NP  SYMBICORT 160-4.5 MCG/ACT inhaler Inhale 2 puffs into the lungs 2 (two) times daily.  04/21/14   Historical Provider, MD  tiotropium (SPIRIVA) 18 MCG inhalation capsule  Place 1 capsule (18 mcg total) into inhaler and inhale daily. 04/23/15   Demetrios Loll, MD  traMADol Veatrice Bourbon) 50  MG tablet Take 50 mg by mouth every 6 (six) hours as needed for moderate pain or severe pain.  03/30/15   Historical Provider, MD   No Known Allergies Review of Systems  Unable to perform ROS: Mental status change    Physical Exam  Constitutional:  Frail, acutely ill appearing man  Neck: Normal range of motion.  Pulmonary/Chest: Effort normal.  Neurological: He is alert.  Speech dysarthric Right-sided weakness   Skin: Skin is warm.  Psychiatric:  Mood lability  Nursing note and vitals reviewed.   Vital Signs: BP 140/65 mmHg  Pulse 96  Temp(Src) 98.2 F (36.8 C) (Oral)  Resp 16  Wt 92 kg (202 lb 13.2 oz)  SpO2 96% Pain Assessment: Faces   Pain Score: Asleep   SpO2: SpO2: 96 % O2 Device:SpO2: 96 % O2 Flow Rate: .   IO: Intake/output summary:  Intake/Output Summary (Last 24 hours) at 07/10/15 1438 Last data filed at 07/10/15 1300  Gross per 24 hour  Intake    240 ml  Output    200 ml  Net     40 ml    LBM: Last BM Date: 07/07/15 Baseline Weight: Weight: 92.6 kg (204 lb 2.3 oz) Most recent weight: Weight: 92 kg (202 lb 13.2 oz)     Palliative Assessment/Data:   Flowsheet Rows        Most Recent Value   Intake Tab    Referral Department  Hospitalist   Unit at Time of Referral  Other (Comment)   Palliative Care Primary Diagnosis  Neurology   Date Notified  07/09/15   Palliative Care Type  New Palliative care   Reason for referral  Clarify Goals of Care   Date of Admission  06/13/15   Date first seen by Palliative Care  07/10/15   # of days Palliative referral response time  1 Day(s)   # of days IP prior to Palliative referral  26   Clinical Assessment    Palliative Performance Scale Score  30%   Pain Max last 24 hours  Not able to report   Pain Min Last 24 hours  Not able to report   Dyspnea Max Last 24 Hours  Not able to report   Dyspnea Min  Last 24 hours  Not able to report   Nausea Max Last 24 Hours  Not able to report   Nausea Min Last 24 Hours  Not able to report   Anxiety Max Last 24 Hours  Not able to report   Anxiety Min Last 24 Hours  Not able to report   Other Max Last 24 Hours  Not able to report   Psychosocial & Spiritual Assessment    Palliative Care Outcomes    Patient/Family meeting held?  Yes   Who was at the meeting?  pt, spouse   Palliative Care Outcomes  Clarified goals of care, Counseled regarding hospice, Provided psychosocial or spiritual support, Provided advance care planning, Completed durable DNR, Changed CPR status   Patient/Family wishes: Interventions discontinued/not started   Mechanical Ventilation, NIPPV, BiPAP, Trach, Tube feedings/TPN   Palliative Care follow-up planned  Yes, Facility      Time In: 0830 Time Out: 0945 Time Total: 75 min Greater than 50%  of this time was spent counseling and coordinating care related to the above assessment and plan.  Signed by: Dory Horn, NP   Please contact Palliative Medicine Team phone at [phone] for questions and concerns.  For individual provider: See  Amion

## 2015-07-10 NOTE — Progress Notes (Signed)
Subjective/Complaints: Pt sleeping comfortably in bed once again.  He remains confused but denies any pain or current issues.    Review of systems: limited by dysarthria and ?aphasia, however, appears to deny CP, SOB, N/V/D.  Objective: Vital Signs: Blood pressure 140/65, pulse 96, temperature 98.2 F (36.8 C), temperature source Oral, resp. rate 16, weight 92 kg (202 lb 13.2 oz), SpO2 96 %. Dg Chest 1 View  07/08/2015  CLINICAL DATA:  Altered mental status for 1 day. History of lung carcinoma EXAM: CHEST 1 VIEW COMPARISON:  Chest radiograph July 06, 2015; PET-CT May 03, 2015 FINDINGS: There is again noted opacity in the left mid lung region extending to the left hilum, likely due to mass with postobstructive pneumonitis. Right lung clear. Heart is upper normal in size with pulmonary vascularity within normal limits. Calcification noted in the aortic arch. Port-A-Cath tip in superior vena cava. No pneumothorax. No bone lesions evident. IMPRESSION: Stable left midlung opacity, likely due to mass with postobstructive pneumonitis. Lungs elsewhere clear. No new opacity. Stable cardiac silhouette. Aortic atherosclerosis. Electronically Signed   By: Lowella Grip III M.D.   On: 07/08/2015 19:57   Results for orders placed or performed during the hospital encounter of 06/22/2015 (from the past 72 hour(s))  Glucose, capillary     Status: Abnormal   Collection Time: 07/07/15 11:30 AM  Result Value Ref Range   Glucose-Capillary 143 (H) 65 - 99 mg/dL  Comprehensive metabolic panel     Status: Abnormal   Collection Time: 07/07/15  2:30 PM  Result Value Ref Range   Sodium 136 135 - 145 mmol/L   Potassium 5.1 3.5 - 5.1 mmol/L   Chloride 105 101 - 111 mmol/L   CO2 22 22 - 32 mmol/L   Glucose, Bld 92 65 - 99 mg/dL   BUN 10 6 - 20 mg/dL   Creatinine, Ser 1.51 (H) 0.61 - 1.24 mg/dL   Calcium 9.8 8.9 - 10.3 mg/dL   Total Protein 7.0 6.5 - 8.1 g/dL   Albumin 3.3 (L) 3.5 - 5.0 g/dL   AST 20 15 - 41  U/L   ALT 16 (L) 17 - 63 U/L   Alkaline Phosphatase 102 38 - 126 U/L   Total Bilirubin 0.5 0.3 - 1.2 mg/dL   GFR calc non Af Amer 43 (L) >60 mL/min   GFR calc Af Amer 50 (L) >60 mL/min    Comment: (NOTE) The eGFR has been calculated using the CKD EPI equation. This calculation has not been validated in all clinical situations. eGFR's persistently <60 mL/min signify possible Chronic Kidney Disease.    Anion gap 9 5 - 15  Glucose, capillary     Status: Abnormal   Collection Time: 07/07/15  4:23 PM  Result Value Ref Range   Glucose-Capillary 106 (H) 65 - 99 mg/dL  Glucose, capillary     Status: None   Collection Time: 07/07/15  8:40 PM  Result Value Ref Range   Glucose-Capillary 96 65 - 99 mg/dL   Comment 1 Notify RN   Hemoglobin and hematocrit, blood     Status: Abnormal   Collection Time: 07/08/15  4:30 AM  Result Value Ref Range   Hemoglobin 8.9 (L) 13.0 - 17.0 g/dL   HCT 28.7 (L) 39.0 - 52.0 %  Glucose, capillary     Status: Abnormal   Collection Time: 07/08/15  6:55 AM  Result Value Ref Range   Glucose-Capillary 112 (H) 65 - 99 mg/dL   Comment 1  Notify RN   Glucose, capillary     Status: Abnormal   Collection Time: 07/08/15 11:37 AM  Result Value Ref Range   Glucose-Capillary 174 (H) 65 - 99 mg/dL  Glucose, capillary     Status: None   Collection Time: 07/08/15  4:26 PM  Result Value Ref Range   Glucose-Capillary 80 65 - 99 mg/dL  Glucose, capillary     Status: None   Collection Time: 07/08/15  9:36 PM  Result Value Ref Range   Glucose-Capillary 77 65 - 99 mg/dL  CBC with Differential/Platelet     Status: Abnormal   Collection Time: 07/09/15  5:00 AM  Result Value Ref Range   WBC 6.3 4.0 - 10.5 K/uL   RBC 3.34 (L) 4.22 - 5.81 MIL/uL   Hemoglobin 9.1 (L) 13.0 - 17.0 g/dL   HCT 29.4 (L) 39.0 - 52.0 %   MCV 88.0 78.0 - 100.0 fL   MCH 27.2 26.0 - 34.0 pg   MCHC 31.0 30.0 - 36.0 g/dL   RDW 17.3 (H) 11.5 - 15.5 %   Platelets 216 150 - 400 K/uL   Neutrophils  Relative % 67 %   Neutro Abs 4.3 1.7 - 7.7 K/uL   Lymphocytes Relative 14 %   Lymphs Abs 0.9 0.7 - 4.0 K/uL   Monocytes Relative 15 %   Monocytes Absolute 1.0 0.1 - 1.0 K/uL   Eosinophils Relative 4 %   Eosinophils Absolute 0.2 0.0 - 0.7 K/uL   Basophils Relative 0 %   Basophils Absolute 0.0 0.0 - 0.1 K/uL  Basic metabolic panel     Status: Abnormal   Collection Time: 07/09/15  5:00 AM  Result Value Ref Range   Sodium 135 135 - 145 mmol/L   Potassium 4.5 3.5 - 5.1 mmol/L   Chloride 103 101 - 111 mmol/L   CO2 22 22 - 32 mmol/L   Glucose, Bld 99 65 - 99 mg/dL   BUN 12 6 - 20 mg/dL   Creatinine, Ser 1.36 (H) 0.61 - 1.24 mg/dL   Calcium 9.6 8.9 - 10.3 mg/dL   GFR calc non Af Amer 49 (L) >60 mL/min   GFR calc Af Amer 57 (L) >60 mL/min    Comment: (NOTE) The eGFR has been calculated using the CKD EPI equation. This calculation has not been validated in all clinical situations. eGFR's persistently <60 mL/min signify possible Chronic Kidney Disease.    Anion gap 10 5 - 15  Urinalysis, Routine w reflex microscopic (not at [facility])     Status: None   Collection Time: 07/09/15  5:05 AM  Result Value Ref Range   Color, Urine YELLOW YELLOW   APPearance CLEAR CLEAR   Specific Gravity, Urine 1.017 1.005 - 1.030   pH 6.0 5.0 - 8.0   Glucose, UA NEGATIVE NEGATIVE mg/dL   Hgb urine dipstick NEGATIVE NEGATIVE   Bilirubin Urine NEGATIVE NEGATIVE   Ketones, ur NEGATIVE NEGATIVE mg/dL   Protein, ur NEGATIVE NEGATIVE mg/dL   Nitrite NEGATIVE NEGATIVE   Leukocytes, UA NEGATIVE NEGATIVE    Comment: MICROSCOPIC NOT DONE ON URINES WITH NEGATIVE PROTEIN, BLOOD, LEUKOCYTES, NITRITE, OR GLUCOSE <1000 mg/dL.  Culture, Urine     Status: Abnormal   Collection Time: 07/09/15  5:05 AM  Result Value Ref Range   Specimen Description URINE, CATHETERIZED    Special Requests NONE    Culture <10,000 COLONIES/mL INSIGNIFICANT GROWTH (A)    Report Status 07/10/2015 FINAL   Glucose, capillary  Status:  Abnormal   Collection Time: 07/09/15  7:11 AM  Result Value Ref Range   Glucose-Capillary 127 (H) 65 - 99 mg/dL  Glucose, capillary     Status: Abnormal   Collection Time: 07/09/15  1:16 PM  Result Value Ref Range   Glucose-Capillary 115 (H) 65 - 99 mg/dL  Glucose, capillary     Status: Abnormal   Collection Time: 07/09/15  5:38 PM  Result Value Ref Range   Glucose-Capillary 134 (H) 65 - 99 mg/dL  Glucose, capillary     Status: Abnormal   Collection Time: 07/09/15  6:16 PM  Result Value Ref Range   Glucose-Capillary 139 (H) 65 - 99 mg/dL  Glucose, capillary     Status: Abnormal   Collection Time: 07/09/15  9:37 PM  Result Value Ref Range   Glucose-Capillary 120 (H) 65 - 99 mg/dL   Comment 1 Notify RN   Glucose, capillary     Status: Abnormal   Collection Time: 07/10/15  6:58 AM  Result Value Ref Range   Glucose-Capillary 134 (H) 65 - 99 mg/dL     HEENT: Normocephalic. Atraumatic Cardio: irregular irregular Resp: CTA B/L and unlabored GI: BS positive and nontender nondistended Skin:   Intact. Warm and dry. Neuro: Flat Motor RUE/RLE: remains 0/5 Moving LUE/LUE without difficulty Dysarthric and Aphasic Right facial weakness Musc/Skel:  No edema. No tenderness. Gen. no acute distress.    Assessment/Plan: 1. 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 Physiatrist is providing close team supervision and 24 hour management of active medical problems listed below. Physiatrist and rehab team continue to assess barriers to discharge/monitor patient progress toward functional and medical goals. FIM: Function - Bathing Position: Bed Body parts bathed by patient: Chest, Abdomen, Right arm, Front perineal area Body parts bathed by helper: Back, Buttocks Bathing not applicable: Right upper leg, Left upper leg, Right lower leg, Left lower  leg Assist Level: 2 helpers (Total assist)  Function- Upper Body Dressing/Undressing What is the patient wearing?: Pull over shirt/dress Pull over shirt/dress - Perfomed by patient: Thread/unthread left sleeve, Put head through opening Pull over shirt/dress - Perfomed by helper: Thread/unthread right sleeve, Pull shirt over trunk Assist Level: 2 helpers Function - Lower Body Dressing/Undressing What is the patient wearing?: Pants Position: Bed Pants- Performed by helper: Thread/unthread right pants leg, Thread/unthread left pants leg, Pull pants up/down Non-skid slipper socks- Performed by helper: Don/doff right sock, Don/doff left sock Assist for footwear: Dependant Assist for lower body dressing: 2 Helpers  Function - Toileting Toileting activity did not occur: No continent bowel/bladder event Assist level: Two helpers (per [employer], NT)  Function - Air cabin crew transfer activity did not occur: Safety/medical concerns Assist level to toilet: 2 helpers (per Berkley Harvey, NT) Assist level from toilet: 2 helpers  Function - Chair/bed transfer Chair/bed transfer activity did not occur: Safety/medical concerns Chair/bed transfer method: Lateral scoot Chair/bed transfer assist level: 2 helpers Chair/bed transfer assistive device: Sliding board Mechanical lift: Stedy Chair/bed transfer details: Verbal cues for precautions/safety, Verbal cues for sequencing, Verbal cues for technique  Function - Locomotion: Wheelchair Will patient use wheelchair at discharge?: Yes Type: Manual Max wheelchair distance: 10 Assist Level: Maximal assistance (Pt 25 - 49%) Assist Level: Maximal assistance (Pt 25 - 49%) Wheel 150 feet activity did not occur: Safety/medical concerns Turns around,maneuvers to table,bed, and toilet,negotiates 3% grade,maneuvers on rugs  and over doorsills: No Function - Locomotion: Ambulation Ambulation activity did not occur: Safety/medical  concerns Assistive device: Other (comment) (three muskateers) Max distance: 25 Assist level: 2 helpers Assist level: 2 helpers  Function - Comprehension Comprehension: Auditory Comprehension assistive device: Hearing aids Comprehension assist level: Understands basic 75 - 89% of the time/ requires cueing 10 - 24% of the time  Function - Expression Expression: Verbal Expression assist level: Expresses basic 50 - 74% of the time/requires cueing 25 - 49% of the time. Needs to repeat parts of sentences.  Function - Social Interaction Social Interaction assist level: Interacts appropriately 25 - 49% of time - Needs frequent redirection.  Function - Problem Solving Problem solving assist level: Solves basic 25 - 49% of the time - needs direction more than half the time to initiate, plan or complete simple activities  Function - Memory Memory assist level: Recognizes or recalls 25 - 49% of the time/requires cueing 50 - 75% of the time Patient normally able to recall (first 3 days only): Current season    Medical Problem List and Plan: 1.  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 Plan to follow-up MRI of the brain after ICH absorbed for further evaluation of possible metastases posterior left frontal lobe.   Cont CIR  Plan to repeat MRI next week prior to discharge with neurology to follow up, Xarelto for stroke prophylaxis  Orthotics for RUE 2.  DVT Prophylaxis/Anticoagulation: SCDs. Monitor for signs of DVT, Xarelto should help with this as well 3. Pain Management: Ultram and oxycodone as needed 4. Mood: Xanax 0.25 mg 3 times daily as needed 5. Neuropsych: This patient is not capable of making decisions on his own behalf with caregiver/staff assistance. 6. Skin/Wound Care: Routine skin checks 7. Fluids/Electrolytes/Nutrition: Routine I&O's with follow-up chemistries,severe swallowing difficulties, meal intake is 75-100%   8. Seizure  prophylaxis. Keppra now at 1000 mg twice a day---more alert. EEG displayed seizure activity 9. Atrial fibrillation. Cardiac rate control. Continue amiodarone 200 mg daily, Cardizem 60 mg twice a day.Eliquis held secondary to [place], family does not want to resume, would be comfortable with Xarelto , Initiated low-dose due to stable hemoglobin Filed Vitals:   07/09/15 2103 07/10/15 0453  BP: 126/62 140/65  Pulse:  96  Temp:  98.2 F (36.8 C)  Resp:  16   10. 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 This is likely the etiology of the persistent cough, Right lung nodules,As noted plan follow-up MRI after ICH absorbed for further evaluation of possible metastasis 11. Diabetes mellitus with peripheral neuropathy. Hemoglobin A1c 6.8. Glucophage 1000 mg at breakfast and 500 mg supper ,  Amaryl 1 mg daily. Check blood sugars before meals and at bedtime Generally well controlled on the current regimen--no changes today CBG (last 3)   Recent Labs  07/09/15 1816 07/09/15 2137 07/10/15 0658  GLUCAP 139* 120* 134*   12. Hyperlipidemia. Lipitor 13. UTI enterobacter cloacae- Sensitive to ceftriaxone. Started 1 g every 24 hours.  Afebrile , 7 day course    Will complete on Monday           14. Anemia stool guaiac negative X 2,    As per internal medicine now on Xarelto. This was also Recommended by neurology.  Hemoglobin stable at 9.1 on 6/30.    Cont to monitor 15. CKD  Cr. 1.36 on 6/30    LOS (Days) 19 A FACE  TO FACE EVALUATION WAS PERFORMED  Annaleise Burger T 07/10/2015, 8:33 AM

## 2015-07-10 DEATH — deceased

## 2015-07-11 ENCOUNTER — Inpatient Hospital Stay (HOSPITAL_COMMUNITY): Payer: Medicare HMO | Admitting: Physical Therapy

## 2015-07-11 ENCOUNTER — Inpatient Hospital Stay (HOSPITAL_COMMUNITY): Payer: Medicare HMO | Admitting: Occupational Therapy

## 2015-07-11 DIAGNOSIS — C3492 Malignant neoplasm of unspecified part of left bronchus or lung: Secondary | ICD-10-CM

## 2015-07-11 LAB — CBC WITH DIFFERENTIAL/PLATELET
Basophils Absolute: 0 10*3/uL (ref 0.0–0.1)
Basophils Relative: 0 %
Eosinophils Absolute: 0.4 10*3/uL (ref 0.0–0.7)
Eosinophils Relative: 9 %
HCT: 26.7 % — ABNORMAL LOW (ref 39.0–52.0)
Hemoglobin: 8.2 g/dL — ABNORMAL LOW (ref 13.0–17.0)
Lymphocytes Relative: 16 %
Lymphs Abs: 0.6 10*3/uL — ABNORMAL LOW (ref 0.7–4.0)
MCH: 27.3 pg (ref 26.0–34.0)
MCHC: 30.7 g/dL (ref 30.0–36.0)
MCV: 89 fL (ref 78.0–100.0)
Monocytes Absolute: 0.6 10*3/uL (ref 0.1–1.0)
Monocytes Relative: 16 %
Neutro Abs: 2.3 10*3/uL (ref 1.7–7.7)
Neutrophils Relative %: 59 %
Platelets: 208 10*3/uL (ref 150–400)
RBC: 3 MIL/uL — ABNORMAL LOW (ref 4.22–5.81)
RDW: 16.7 % — ABNORMAL HIGH (ref 11.5–15.5)
WBC: 3.9 10*3/uL — ABNORMAL LOW (ref 4.0–10.5)

## 2015-07-11 LAB — RENAL FUNCTION PANEL
Albumin: 2.6 g/dL — ABNORMAL LOW (ref 3.5–5.0)
Anion gap: 9 (ref 5–15)
BUN: 13 mg/dL (ref 6–20)
CO2: 24 mmol/L (ref 22–32)
Calcium: 9.2 mg/dL (ref 8.9–10.3)
Chloride: 105 mmol/L (ref 101–111)
Creatinine, Ser: 1.23 mg/dL (ref 0.61–1.24)
GFR calc Af Amer: >60 mL/min (ref >60)
GFR calc non Af Amer: 56 mL/min — ABNORMAL LOW (ref >60)
Glucose, Bld: 110 mg/dL — ABNORMAL HIGH (ref 65–99)
Phosphorus: 3.5 mg/dL (ref 2.5–4.6)
Potassium: 4.3 mmol/L (ref 3.5–5.1)
Sodium: 138 mmol/L (ref 135–145)

## 2015-07-11 LAB — GLUCOSE, CAPILLARY
GLUCOSE-CAPILLARY: 125 mg/dL — AB (ref 65–99)
GLUCOSE-CAPILLARY: 107 mg/dL — AB (ref 65–99)
Glucose-Capillary: 107 mg/dL — ABNORMAL HIGH (ref 65–99)
Glucose-Capillary: 159 mg/dL — ABNORMAL HIGH (ref 65–99)

## 2015-07-11 NOTE — Progress Notes (Signed)
Subjective/Complaints: Up eating breakfast. Appears comfortable. Denies pain.   Review of systems: limited by dysarthria and ?aphasia, however, appears to deny CP, SOB, N/V/D.  Objective: Vital Signs: Blood pressure 115/46, pulse 87, temperature 97.5 F (36.4 C), temperature source Oral, resp. rate 18, weight 92 kg (202 lb 13.2 oz), SpO2 93 %. No results found. Results for orders placed or performed during the hospital encounter of 06/11/2015 (from the past 72 hour(s))  Glucose, capillary     Status: Abnormal   Collection Time: 07/08/15 11:37 AM  Result Value Ref Range   Glucose-Capillary 174 (H) 65 - 99 mg/dL  Glucose, capillary     Status: None   Collection Time: 07/08/15  4:26 PM  Result Value Ref Range   Glucose-Capillary 80 65 - 99 mg/dL  Glucose, capillary     Status: None   Collection Time: 07/08/15  9:36 PM  Result Value Ref Range   Glucose-Capillary 77 65 - 99 mg/dL  CBC with Differential/Platelet     Status: Abnormal   Collection Time: 07/09/15  5:00 AM  Result Value Ref Range   WBC 6.3 4.0 - 10.5 K/uL   RBC 3.34 (L) 4.22 - 5.81 MIL/uL   Hemoglobin 9.1 (L) 13.0 - 17.0 g/dL   HCT 29.4 (L) 39.0 - 52.0 %   MCV 88.0 78.0 - 100.0 fL   MCH 27.2 26.0 - 34.0 pg   MCHC 31.0 30.0 - 36.0 g/dL   RDW 17.3 (H) 11.5 - 15.5 %   Platelets 216 150 - 400 K/uL   Neutrophils Relative % 67 %   Neutro Abs 4.3 1.7 - 7.7 K/uL   Lymphocytes Relative 14 %   Lymphs Abs 0.9 0.7 - 4.0 K/uL   Monocytes Relative 15 %   Monocytes Absolute 1.0 0.1 - 1.0 K/uL   Eosinophils Relative 4 %   Eosinophils Absolute 0.2 0.0 - 0.7 K/uL   Basophils Relative 0 %   Basophils Absolute 0.0 0.0 - 0.1 K/uL  Basic metabolic panel     Status: Abnormal   Collection Time: 07/09/15  5:00 AM  Result Value Ref Range   Sodium 135 135 - 145 mmol/L   Potassium 4.5 3.5 - 5.1 mmol/L   Chloride 103 101 - 111 mmol/L   CO2 22 22 - 32 mmol/L   Glucose, Bld 99 65 - 99 mg/dL   BUN 12 6 - 20 mg/dL   Creatinine, Ser 1.36  (H) 0.61 - 1.24 mg/dL   Calcium 9.6 8.9 - 10.3 mg/dL   GFR calc non Af Amer 49 (L) >60 mL/min   GFR calc Af Amer 57 (L) >60 mL/min    Comment: (NOTE) The eGFR has been calculated using the CKD EPI equation. This calculation has not been validated in all clinical situations. eGFR's persistently <60 mL/min signify possible Chronic Kidney Disease.    Anion gap 10 5 - 15  Urinalysis, Routine w reflex microscopic (not at [facility])     Status: None   Collection Time: 07/09/15  5:05 AM  Result Value Ref Range   Color, Urine YELLOW YELLOW   APPearance CLEAR CLEAR   Specific Gravity, Urine 1.017 1.005 - 1.030   pH 6.0 5.0 - 8.0   Glucose, UA NEGATIVE NEGATIVE mg/dL   Hgb urine dipstick NEGATIVE NEGATIVE   Bilirubin Urine NEGATIVE NEGATIVE   Ketones, ur NEGATIVE NEGATIVE mg/dL   Protein, ur NEGATIVE NEGATIVE mg/dL   Nitrite NEGATIVE NEGATIVE   Leukocytes, UA NEGATIVE NEGATIVE    Comment:  MICROSCOPIC NOT DONE ON URINES WITH NEGATIVE PROTEIN, BLOOD, LEUKOCYTES, NITRITE, OR GLUCOSE <1000 mg/dL.  Culture, Urine     Status: Abnormal   Collection Time: 07/09/15  5:05 AM  Result Value Ref Range   Specimen Description URINE, CATHETERIZED    Special Requests NONE    Culture <10,000 COLONIES/mL INSIGNIFICANT GROWTH (A)    Report Status 07/10/2015 FINAL   Glucose, capillary     Status: Abnormal   Collection Time: 07/09/15  7:11 AM  Result Value Ref Range   Glucose-Capillary 127 (H) 65 - 99 mg/dL  Glucose, capillary     Status: Abnormal   Collection Time: 07/09/15  1:16 PM  Result Value Ref Range   Glucose-Capillary 115 (H) 65 - 99 mg/dL  Glucose, capillary     Status: Abnormal   Collection Time: 07/09/15  5:38 PM  Result Value Ref Range   Glucose-Capillary 134 (H) 65 - 99 mg/dL  Glucose, capillary     Status: Abnormal   Collection Time: 07/09/15  6:16 PM  Result Value Ref Range   Glucose-Capillary 139 (H) 65 - 99 mg/dL  Glucose, capillary     Status: Abnormal   Collection Time: 07/09/15   9:37 PM  Result Value Ref Range   Glucose-Capillary 120 (H) 65 - 99 mg/dL   Comment 1 Notify RN   Glucose, capillary     Status: Abnormal   Collection Time: 07/10/15  6:58 AM  Result Value Ref Range   Glucose-Capillary 134 (H) 65 - 99 mg/dL  Glucose, capillary     Status: Abnormal   Collection Time: 07/10/15 12:22 PM  Result Value Ref Range   Glucose-Capillary 120 (H) 65 - 99 mg/dL  Glucose, capillary     Status: Abnormal   Collection Time: 07/10/15  4:31 PM  Result Value Ref Range   Glucose-Capillary 130 (H) 65 - 99 mg/dL  Glucose, capillary     Status: Abnormal   Collection Time: 07/10/15  9:08 PM  Result Value Ref Range   Glucose-Capillary 118 (H) 65 - 99 mg/dL  Occult blood card to lab, stool RN will collect     Status: None   Collection Time: 07/10/15 10:06 PM  Result Value Ref Range   Fecal Occult Bld NEGATIVE NEGATIVE  Renal function panel     Status: Abnormal   Collection Time: 07/11/15  6:00 AM  Result Value Ref Range   Sodium 138 135 - 145 mmol/L   Potassium 4.3 3.5 - 5.1 mmol/L   Chloride 105 101 - 111 mmol/L   CO2 24 22 - 32 mmol/L   Glucose, Bld 110 (H) 65 - 99 mg/dL   BUN 13 6 - 20 mg/dL   Creatinine, Ser 1.23 0.61 - 1.24 mg/dL   Calcium 9.2 8.9 - 10.3 mg/dL   Phosphorus 3.5 2.5 - 4.6 mg/dL   Albumin 2.6 (L) 3.5 - 5.0 g/dL   GFR calc non Af Amer 56 (L) >60 mL/min   GFR calc Af Amer >60 >60 mL/min    Comment: (NOTE) The eGFR has been calculated using the CKD EPI equation. This calculation has not been validated in all clinical situations. eGFR's persistently <60 mL/min signify possible Chronic Kidney Disease.    Anion gap 9 5 - 15  CBC with Differential/Platelet     Status: Abnormal   Collection Time: 07/11/15  6:00 AM  Result Value Ref Range   WBC 3.9 (L) 4.0 - 10.5 K/uL   RBC 3.00 (L) 4.22 - 5.81 MIL/uL  Hemoglobin 8.2 (L) 13.0 - 17.0 g/dL   HCT 26.7 (L) 39.0 - 52.0 %   MCV 89.0 78.0 - 100.0 fL   MCH 27.3 26.0 - 34.0 pg   MCHC 30.7 30.0 - 36.0  g/dL   RDW 16.7 (H) 11.5 - 15.5 %   Platelets 208 150 - 400 K/uL   Neutrophils Relative % 59 %   Neutro Abs 2.3 1.7 - 7.7 K/uL   Lymphocytes Relative 16 %   Lymphs Abs 0.6 (L) 0.7 - 4.0 K/uL   Monocytes Relative 16 %   Monocytes Absolute 0.6 0.1 - 1.0 K/uL   Eosinophils Relative 9 %   Eosinophils Absolute 0.4 0.0 - 0.7 K/uL   Basophils Relative 0 %   Basophils Absolute 0.0 0.0 - 0.1 K/uL  Glucose, capillary     Status: Abnormal   Collection Time: 07/11/15  6:39 AM  Result Value Ref Range   Glucose-Capillary 107 (H) 65 - 99 mg/dL     HEENT: Normocephalic. Atraumatic Cardio: irregular irregular Resp: CTA B/L and unlabored. No wheezes or rales GI: BS positive and nontender nondistended Skin:   Intact. Warm and dry. Neuro: Flat Motor RUE/RLE: remains 0/5 Moving LUE/LUE without difficulty Dysarthric and Aphasic Right facial weakness Musc/Skel:  No edema. No tenderness. Gen. no acute distress.    Assessment/Plan: 1. 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 Physiatrist is providing close team supervision and 24 hour management of active medical problems listed below. Physiatrist and rehab team continue to assess barriers to discharge/monitor patient progress toward functional and medical goals. FIM: Function - Bathing Position: Bed Body parts bathed by patient: Chest, Abdomen, Right arm, Front perineal area Body parts bathed by helper: Back, Buttocks, Right upper leg, Left upper leg, Right lower leg, Left lower leg Bathing not applicable: Right upper leg, Left upper leg, Right lower leg, Left lower leg Assist Level: 2 helpers  Function- Upper Body Dressing/Undressing What is the patient wearing?: Hospital gown Pull over shirt/dress - Perfomed by patient: Thread/unthread left sleeve, Put head through opening Pull over shirt/dress -  Perfomed by helper: Thread/unthread right sleeve, Pull shirt over trunk Assist Level: 2 helpers Function - Lower Body Dressing/Undressing What is the patient wearing?: Pants, Non-skid slipper socks Position: Bed Pants- Performed by helper: Thread/unthread right pants leg, Thread/unthread left pants leg, Pull pants up/down Non-skid slipper socks- Performed by helper: Don/doff right sock, Don/doff left sock Assist for footwear: Dependant Assist for lower body dressing: 2 Helpers  Function - Toileting Toileting activity did not occur: No continent bowel/bladder event Assist level: Two helpers (per [employer], NT)  Function - Air cabin crew transfer activity did not occur: Safety/medical concerns Assist level to toilet: 2 helpers (per Berkley Harvey, NT) Assist level from toilet: 2 helpers  Function - Chair/bed transfer Chair/bed transfer activity did not occur: Safety/medical concerns Chair/bed transfer method: Lateral scoot Chair/bed transfer assist level: 2 helpers Chair/bed transfer assistive device: Sliding board Mechanical lift: Stedy Chair/bed transfer details: Verbal cues for precautions/safety, Verbal cues for sequencing, Verbal cues for technique  Function - Locomotion: Wheelchair Will patient use wheelchair at discharge?: Yes Type: Manual Max wheelchair distance: 10 Assist Level: Dependent (Pt equals 0%) Assist Level: Maximal assistance (Pt 25 - 49%) Wheel 150 feet activity did not occur: Safety/medical concerns Turns around,maneuvers to table,bed, and toilet,negotiates 3% grade,maneuvers on rugs and over doorsills: No Function - Locomotion:  Ambulation Ambulation activity did not occur: Safety/medical concerns Assistive device: Other (comment) (three muskateers) Max distance: 25 Assist level: 2 helpers Assist level: 2 helpers  Function - Comprehension Comprehension: Auditory Comprehension assistive device: Hearing aids Comprehension assist level:  Understands basic 75 - 89% of the time/ requires cueing 10 - 24% of the time  Function - Expression Expression: Verbal Expression assist level: Expresses basic 50 - 74% of the time/requires cueing 25 - 49% of the time. Needs to repeat parts of sentences.  Function - Social Interaction Social Interaction assist level: Interacts appropriately 50 - 74% of the time - May be physically or verbally inappropriate.  Function - Problem Solving Problem solving assist level: Solves basic 50 - 74% of the time/requires cueing 25 - 49% of the time  Function - Memory Memory assist level: Recognizes or recalls 50 - 74% of the time/requires cueing 25 - 49% of the time Patient normally able to recall (first 3 days only): That he or she is in a hospital    Medical Problem List and Plan: 1.  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 Plan to follow-up MRI of the brain after ICH absorbed for further evaluation of possible metastases posterior left frontal lobe.   Cont CIR  Plan to repeat MRI next week prior to discharge with neurology to follow up, Xarelto for stroke prophylaxis  Orthotics for RUE 2.  DVT Prophylaxis/Anticoagulation: SCDs. Monitor for signs of DVT, Xarelto should help with this as well 3. Pain Management: Ultram and oxycodone as needed 4. Mood: Xanax 0.25 mg 3 times daily as needed 5. Neuropsych: This patient is not capable of making decisions on his own behalf with caregiver/staff assistance. 6. Skin/Wound Care: Routine skin checks 7. Fluids/Electrolytes/Nutrition: Routine I&O's with follow-up chemistries,severe swallowing difficulties, meal intake remains around 75-100%   8. Seizure prophylaxis. Keppra now at 1000 mg twice a day---more alert. EEG displayed seizure activity 9. Atrial fibrillation. Cardiac rate control. Continue amiodarone 200 mg daily, Cardizem 60 mg twice a day.Eliquis held secondary to [place], family does not want to resume, would be  comfortable with Xarelto , Initiated low-dose due to stable hemoglobin Filed Vitals:   07/10/15 1500 07/11/15 0526  BP: 129/56 115/46  Pulse: 88 87  Temp: 97.4 F (36.3 C) 97.5 F (36.4 C)  Resp: 17 18   10. 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 This is likely the etiology of the persistent cough, Right lung nodules,As noted plan follow-up MRI after ICH absorbed for further evaluation of possible metastasis 11. Diabetes mellitus with peripheral neuropathy. Hemoglobin A1c 6.8. Glucophage 1000 mg at breakfast and 500 mg supper ,  Amaryl 1 mg daily. Check blood sugars before meals and at bedtime Generally well controlled on the current regimen--no changes at present CBG (last 3)   Recent Labs  07/10/15 1631 07/10/15 2108 07/11/15 0639  GLUCAP 130* 118* 107*   12. Hyperlipidemia. Lipitor 13. UTI enterobacter cloacae- Sensitive to ceftriaxone. Started 1 g every 24 hours.  Afebrile , 7 day course    Will complete on Monday           14. Anemia stool guaiac negative X 2,    As per internal medicine now on Xarelto. This was also Recommended by neurology.  Hemoglobin stable at 9.1 on 6/30.    Cont to monitor 15. CKD  Cr. 1.36 on 6/30    LOS (Days) 20 A FACE  TO FACE EVALUATION WAS PERFORMED  SWARTZ,ZACHARY T 07/11/2015, 9:28 AM

## 2015-07-11 NOTE — Progress Notes (Signed)
Physical Therapy Session Note  Patient Details  Name: BRYAN GOIN MRN: [id number] Date of Birth: 07/17/1940  Today's Date: 07/11/2015 PT Individual Time: 1300-1400 PT Individual Time Calculation (min): 60 min   Short Term Goals: Week 3:  PT Short Term Goal 1 (Week 3): =LTG due to estimated length of stay  Skilled Therapeutic Interventions/Progress Updates:    Pt received sleeping in bed, easily awakes to voice and agreeable to therapy session with encouragement.  Mod assist for supine>sit with use of rails and +2 for slide board to pt's R.  Pt demos poor forward weight shift and limited head/hips relationship.  Pt transported to therapy gym total assist for time management and +2 assist to transfer to therapy mat with slide board.    Pt performed sit<>squat x3 reps with total assist for maintaining forward weight shift and midline.  NMR in sitting focus on forward weight shift and LLE weight bearing reaching anterior and L/anterior for cups and small animals with small therapy bench between knees to maintain BOS.  Pt required min>mod assist to return to midline each time.  Therapeutic rest breaks provided as needed.    +2 slide board transfer back to w/c and pt transported back to room total assist.  Pt left up in w/c with family present awaiting OT session.   Therapy Documentation Precautions:  Precautions Precautions: Fall Precaution Comments: R hemi, pt on home O2 PTA for recent pneumonia (but used it PRN, not 24/7) Restrictions Weight Bearing Restrictions: No   See Function Navigator for Current Functional Status.   Therapy/Group: Individual Therapy  Earnest Conroy Penven-Crew 07/11/2015, 1:47 PM

## 2015-07-11 NOTE — Plan of Care (Signed)
Problem: RH BLADDER ELIMINATION Goal: RH STG MANAGE BLADDER WITH ASSISTANCE STG Manage Bladder With mod Assistance  Outcome: Not Progressing Condom cath in place

## 2015-07-11 NOTE — Progress Notes (Signed)
Daily Progress Note   Patient Name: Ivan Beasley       Date: 07/11/2015 DOB: [birth year]  Age: 75 y.o. MRN#: [id number] Attending Physician: Bonnell Public, MD Primary Care Physician: Albina Billet, MD Admit Date: 06/10/2015  Reason for Consultation/Follow-up: Establishing goals of care, Non pain symptom management, Pain control and Psychosocial/spiritual support  Subjective: Patient alert today, mood not as labile. He is participating in rehabilitation. Met with wife. Goals at this point are to finish rehabilitation and plan is to take patient home. She does request additional information in order to decide whether to accept hospice services in the home principally being if there is any oncology services being offered if he has brain metastases. She does not want the patient to know that he has possible brain metastases until oncology has weighed in. He is followed by an oncologist at [facility], "Dr. Ezzie Dural".  Length of Stay: 20  Current Medications: Scheduled Meds:  . amiodarone  200 mg Oral Daily  . antiseptic oral rinse  7 mL Mouth Rinse BID  . atorvastatin  40 mg Oral q1800  . diclofenac sodium  2 g Topical QID  . diltiazem  60 mg Oral BID  . insulin aspart  0-24 Units Subcutaneous TID AC & HS  . iron polysaccharides  150 mg Oral Daily  . lactose free nutrition  237 mL Oral TID WC  . levETIRAcetam  1,000 mg Intravenous Q12H  . metFORMIN  1,000 mg Oral Q breakfast  . metFORMIN  500 mg Oral Q supper  . pantoprazole sodium  40 mg Oral Daily  . rivaroxaban  15 mg Oral Q supper  . vitamin B-12  1,000 mcg Oral Daily    Continuous Infusions: . sodium chloride 50 mL (07/09/15 2104)    PRN Meds: acetaminophen **OR** acetaminophen, albuterol, ALPRAZolam,  diphenoxylate-atropine, feeding supplement (ENSURE ENLIVE), food thickener, guaiFENesin-dextromethorphan, HYDROcodone-homatropine, MUSCLE RUB, ondansetron **OR** ondansetron (ZOFRAN) IV, oxyCODONE, RESOURCE THICKENUP CLEAR, sodium chloride flush  Physical Exam  Constitutional:  Cachectic frail older man He is alert  HENT:  Head: Normocephalic and atraumatic.  Cardiovascular: Normal rate.   Pulmonary/Chest: Effort normal.  Moist non-productive cough  Neurological: He is alert.  Oriented generally to that he is in the hospital, that he has cancer, and has had a stroke. He has short-term memory  deficits. Speech is dysarthric.  Skin: Skin is warm and dry.  Psychiatric: His behavior is normal. Thought content normal.  Mood less labile today, not tearful Participating with rehabilitation  Nursing note and vitals reviewed.           Vital Signs: BP 124/48 mmHg  Pulse 84  Temp(Src) 98.8 F (37.1 C) (Oral)  Resp 18  Wt 92 kg (202 lb 13.2 oz)  SpO2 93% SpO2: SpO2: 93 % O2 Device: O2 Device: Not Delivered O2 Flow Rate:    Intake/output summary:  Intake/Output Summary (Last 24 hours) at 07/11/15 1505 Last data filed at 07/11/15 0900  Gross per 24 hour  Intake    240 ml  Output      0 ml  Net    240 ml   LBM: Last BM Date: 07/11/15 Baseline Weight: Weight: 92.6 kg (204 lb 2.3 oz) Most recent weight: Weight: 92 kg (202 lb 13.2 oz)       Palliative Assessment/Data:    Flowsheet Rows        Most Recent Value   Intake Tab    Referral Department  Hospitalist   Unit at Time of Referral  Other (Comment)   Palliative Care Primary Diagnosis  Neurology   Date Notified  07/09/15   Palliative Care Type  New Palliative care   Reason for referral  Clarify Goals of Care   Date of Admission  06/13/15   Date first seen by Palliative Care  07/10/15   # of days Palliative referral response time  1 Day(s)   # of days IP prior to Palliative referral  26   Clinical Assessment    Palliative  Performance Scale Score  30%   Pain Max last 24 hours  Not able to report   Pain Min Last 24 hours  Not able to report   Dyspnea Max Last 24 Hours  Not able to report   Dyspnea Min Last 24 hours  Not able to report   Nausea Max Last 24 Hours  Not able to report   Nausea Min Last 24 Hours  Not able to report   Anxiety Max Last 24 Hours  Not able to report   Anxiety Min Last 24 Hours  Not able to report   Other Max Last 24 Hours  Not able to report   Psychosocial & Spiritual Assessment    Palliative Care Outcomes    Patient/Family meeting held?  Yes   Who was at the meeting?  pt, spouse   Palliative Care Outcomes  Clarified goals of care, Counseled regarding hospice, Provided psychosocial or spiritual support, Provided advance care planning, Completed durable DNR, Changed CPR status   Patient/Family wishes: Interventions discontinued/not started   Mechanical Ventilation, NIPPV, BiPAP, Trach, Tube feedings/TPN   Palliative Care follow-up planned  Yes, Facility      Patient Active Problem List   Diagnosis Date Noted  . Palliative care encounter   . 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 Acute blood loss anemia   . Acute confusional state   . Mental confusion   . Type 2 diabetes mellitus with peripheral neuropathy (HCC)   . Confusion   . Adjustment disorder with mixed anxiety and depressed mood   . Chronic anticoagulation   . Malignant neoplasm of left lung ([place])   . Cerebrovascular accident (CVA) due to embolism of left middle cerebral artery ([place])   . SAH (subarachnoid hemorrhage) ([place]) 06/26/2015  . Coagulopathy ([place]) - eliquis related 06/25/2015  .  Brain metastases ([place]), possible 07/07/2015  . Seizures ([place]), possible 07/03/2015  . Asystole ([place]) 06/11/2015  . Hypophosphatemia 06/10/2015  . Hypomagnesemia 07/07/2015  . CKD (chronic kidney disease), stage III 06/25/2015  . Anemia 06/14/2015  . Right hemiparesis ([place]) 06/17/2015  . Cough   . Idiopathic hypotension   . Lung  cancer ([place])   . ICH (intracerebral hemorrhage) ([place])- L frontal ICH 06/13/2015  . Paroxysmal atrial fibrillation ([place]) 05/13/2015  . PNA (pneumonia) 05/13/2015  . HCAP (healthcare-associated pneumonia)   . Pneumonitis   . Acute respiratory failure ([place])   . Acute on chronic respiratory failure with hypoxia ([place]) 04/17/2015  . Respiratory failure ([place]) 04/17/2015  . Cancer of lung ([place]) 05/17/2014  . COPD, mild ([place]) 08/29/2013  . Cough, persistent 08/29/2013  . Mild chronic obstructive pulmonary disease ([place]) 08/29/2013  . H/O cardiac catheterization 07/29/2013  . Breath shortness 06/23/2013  . Chest pain 06/23/2013  . BP (high blood pressure) 06/20/2013  . Diabetes ([place]) 06/20/2013  . HLD (hyperlipidemia) 06/20/2013  . H/O coronary artery bypass surgery 06/20/2013  . Diabetes mellitus ([place]) 06/20/2013  . H/O total knee replacement 06/03/2013    Palliative Care Assessment & Plan   Patient Profile: Patient is a 75 year old man with diabetes coronary artery disease status post CABG, atrial fibrillation on anticoagulation, diagnosis of left lung squamous cell cancer in 2016 with subsequent chemotherapy and radiation who developed recurrence of disease in April 2017. Patient did not tolerate chemotherapy well. On 06/13/2015 patient presented to [facility] with altered mental status and right-sided weakness. Per CT of the head he had had a left frontal intracranial hemorrhage. Patient's L a course was discontinued. Patient was transferred to [facility] for further evaluation; MRI of the brain showed posterior left frontal lobe complex hemorrhagic lesion with surrounding edema. He was maintained on Keppra for seizure prophylaxis. On 627 he became more confused and per MRI now shows new left MCA stroke as well as possible metastatic lesions  Assessment: Patient is an alert and not as much distress. No mood lability  Recommendations/Plan:  Continue with rehabilitation per  CIR program  Palliative medicine team to contact [facility]ology staff in order to facilitate recommendations regarding his potential new brain metastases, specifically: Brain radiation. Resource given to me by patient's wife is Hassan Rowan, who is their triage nurse, at [phone]  Pain: Continue oxycodone 5 mg when necessary. Patient used only one dosage over the past 24 hours  Cough: Continue oxycodone and for Hycodan syrup when necessary  Goals of Care and Additional Recommendations:  Limitations on Scope of Treatment: Avoid Hospitalization, No Artificial Feeding, No Chemotherapy, No Hemodialysis, No Surgical Procedures and No Tracheostomy  Code Status:    Code Status Orders        Start     Ordered   07/10/15 1009  Do not attempt resuscitation (DNR)   Continuous    Question Answer Comment  In the event of cardiac or respiratory ARREST Do not call a "code blue"   In the event of cardiac or respiratory ARREST Do not perform Intubation, CPR, defibrillation or ACLS   In the event of cardiac or respiratory ARREST Use medication by any route, position, wound care, and other measures to relive pain and suffering. May use oxygen, suction and manual treatment of airway obstruction as needed for comfort.      07/10/15 1008    Code Status History    Date Active Date Inactive Code Status Order ID Comments User Context  06/15/2015  4:48 PM 07/10/2015 10:08 AM Full Code [id number]  Cathlyn Parsons, PA-C Inpatient   06/13/2015  3:27 PM 06/13/2015  4:48 PM Full Code [id number]  Maisie Fus, MD Inpatient   06/13/2015 10:56 AM 06/13/2015  3:27 PM Full Code [id number]  Maisie Fus, MD ED   04/17/2015  1:18 PM 04/23/2015  5:12 PM Full Code [id number]  [name], MD ED    Advance Directive Documentation        Most Recent Value   Type of Advance Directive  Healthcare Power of [place], Living will   Pre-existing out of facility DNR order (yellow form or pink MOST form)      "MOST" Form in Place?         Prognosis:   < 6 months not unlikely in the setting of left lung squamous cell cancer now with recurrence of disease in the setting of intracranial hemorrhage as well as left MCA stroke, questionable metastatic lesions to the brain.  Discharge Planning:  Home with Hospice if patient is not going to be offered more aggressive treatment for his cancer such as whole brain radiation he would qualify for hospice services    Thank you for allowing the Palliative Medicine Team to assist in the care of this patient.   Time In: 0930 Time Out: 105 Total Time 35 min Prolonged Time Billed  no       Greater than 50%  of this time was spent counseling and coordinating care related to the above assessment and plan.  Dory Horn, NP  Please contact Palliative Medicine Team phone at [phone] for questions and concerns.

## 2015-07-11 NOTE — Progress Notes (Signed)
Occupational Therapy Session Note  Patient Details  Name: Ivan Beasley MRN: [id number] Date of Birth: 1940-10-27  Today's Date: 07/11/2015 OT Individual Time: 1400-1430 OT Individual Time Calculation (min): 30 min    Short Term Goals: Week 3:  OT Short Term Goal 1 (Week 3): STGs = LTGs based on modified LTGs  Skilled Therapeutic Interventions/Progress Updates:    Pt seen for OT session focusing on self- feeding and adherence to swallowing precautions. Pt sitting up in chair upon arrival, alert, with wife present wanting him to eat lunch which she had just heated up for him. Assisted with upright positioning in tilt-in-space w/c with supervision required for sitting balance in upright chair.  Pt unable to independently recall swallowing pre-cautions/ techniques. Referred pt to swallowing protocol sheet placed at [facility] and reviewed precautions/ demonstrated techniques prior to beginning meal. He was able to self feed using L UE using red build up gripper on utensil. He also demonstrated ability to manage drinking cup with increased time. Mod- max cuing required to initiate throat clearing, multiple swallows and small bites throughout meal. Pt with productive coughing following meal and pt's wife stated this was normal for him.  Pt was reclined back slightly at end of meal for safety with balance/ postural control. Encouraged him to stay in w/c directly following meal, pt agreeable and left in chair with wife present.  [name] with questions regarding d/c date as she is beginning to prepare for ramp to be built at home. Will have CSW/ primary team follow up with her tomorrow. Did encourage her to begin process of building ramp ASAP as it will be needed prior to pt being ready for planned d/c home.  Therapy Documentation Precautions:  Precautions Precautions: Fall Precaution Comments: R hemi, pt on home O2 PTA for recent pneumonia (but used it PRN, not 24/7) Restrictions Weight Bearing  Restrictions: No Pain:   No/ denies pain  See Function Navigator for Current Functional Status.   Therapy/Group: Individual Therapy  Lewis, Carinne Brandenburger C 07/11/2015, 2:41 PM

## 2015-07-11 NOTE — Progress Notes (Signed)
[facility] PROGRESS NOTE  Ivan Beasley SWH:[id number] DOB: September 06, 1940 DOA: 06/26/2015  PCP: Albina Billet, MD  Assessment/Plan: Principal Problem:   ICH (intracerebral hemorrhage) ([place])- L frontal ICH Active Problems:   Mild chronic obstructive pulmonary disease (HCC)   Seizures ([place]), possible   CKD (chronic kidney disease), stage III   Anemia   Right hemiparesis ([place])   Cerebrovascular accident (CVA) due to embolism of left middle cerebral artery (HCC)   Chronic anticoagulation   Malignant neoplasm of left lung (HCC)   Adjustment disorder with mixed anxiety and depressed mood   Mental confusion   Type 2 diabetes mellitus with peripheral neuropathy (HCC)   Confusion   Right flaccid hemiparesis (HCC)   Acute blood loss anemia   Acute confusional state   Palliative care encounter   Ivan Beasley is a 75 y.o. 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 Consultation requested for anemia   Assessment and plan  - Intermittent confusion - Improved significantly. -Volume depletion - Resolved significantly 1. Anemia - H/H remains stable. Patient is back to Xarelto. CT abdomen is negative for retroperitoneal bleed.   2. Recent Right hemiplegia secondary to left MCA infarct adjacent to left frontal ICH with adjacent subarachnoid hemorrhage while on Eliquis for Atrial fib.  - Due to near resolution of ICH as well as has been >2 weeks, and high risk for recurrent embolic stroke off anticoagulation and relative  small size of infarct, neurology Dr. Erlinda Hong  recommended on 6/20, to consider transition back to [place], wife okay with Xarelto, this can be started , continue to monitor hemoglobin  MRI with and without contrast 06/26/15 can not confirm or rule out metastasis at this time. As per neurology Still need to repeat MRI with and without contrast after blood all absorbed.  3. Atrial fib. - sounds PAF., Currently normal sinus rhythm EKG  shows normal sinus rhythm  CHADsVASc score at least 6, very high risk for recurrent strokes, will need to weigh risk/benefits of resuming anticoagulation after r/o bleeding risk. Patient is back to Xarelto. 4. htn - stable. Optimize.  5. Dm 2 w/ neuropathy - Optimize. 6. Cad, sp cabg - in light of cardiac hx, will trx for Hb >8 at this time 7. 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 UTI enterobacter cloacae- Sensitive to ceftriaxone. Completed 7 day course of antibiotics. Repeat UA, urine culture and CXR (brief episode of confusion reported last night) 9.  Chronic nonproductive cough-last chest x-ray was on 6/23 with focal increased density in the right lung base, will repeat chest x-ray, doubt pneumonia, continue incentive spirometry, aspiration precautions    DVT prophylaxsis : On Xarelto  Code Status:  Full code       Family Communication: Discussed with wife. Disposition Plan: As per Charlett Blake, MD     Consultants:  [facility]  Procedures:   None  Antibiotics: Anti-infectives    Start     Dose/Rate Route Frequency Ordered Stop   06/28/15 1000  cefTRIAXone (ROCEPHIN) 1 g in dextrose 5 % 50 mL IVPB  Status:  Discontinued  1 g 100 mL/hr over 30 Minutes Intravenous Every 24 hours 06/28/15 0847 07/05/15 0834   06/27/15 2100  vancomycin (VANCOCIN) 1,500 mg in sodium chloride 0.9 % 500 mL IVPB  Status:  Discontinued     1,500 mg 250 mL/hr over 120 Minutes Intravenous Every 24  hours 06/26/15 2018 06/28/15 0843      HPI/Subjective: Continues to have a nonproductive cough, otherwise stable, afebrile  Objective: Filed Vitals:   07/09/15 2103 07/10/15 0453 07/10/15 1500 07/11/15 0526  BP: 126/62 140/65 129/56 115/46  Pulse:  96 88 87  Temp:  98.2 F (36.8 C) 97.4 F (36.3 C) 97.5 F (36.4 C)  TempSrc:  Oral Oral Oral  Resp:  '16 17 18  '$ Weight:  92 kg (202 lb 13.2 oz)    SpO2:  96% 95% 93%    Intake/Output Summary (Last 24 hours) at 07/11/15 0912 Last data filed at 07/10/15 1751  Gross per 24 hour  Intake    240 ml  Output      0 ml  Net    240 ml    Exam:  Examination:  General exam: Appears calm and comfortable  Respiratory system: Clear to auscultation. Respiratory effort normal. Cardiovascular system: S1 & S2 heard, RRR. No JVD, murmurs, rubs, gallops or clicks. No pedal edema. Gastrointestinal system: Abdomen is nondistended, soft and nontender. No organomegaly or masses felt. Normal bowel sounds heard. Central nervous system: Alert and oriented. Right upper extremity weakness Extremities: Symmetric 5 x 5 power. Skin: No rashes, lesions or ulcers Psychiatry: Judgement and insight appear normal. Mood & affect appropriate.     Data Reviewed: I have personally reviewed following labs and imaging studies  Micro Results Recent Results (from the past 240 hour(s))  Culture, Urine     Status: Abnormal   Collection Time: 07/09/15  5:05 AM  Result Value Ref Range Status   Specimen Description URINE, CATHETERIZED  Final   Special Requests NONE  Final   Culture <10,000 COLONIES/mL INSIGNIFICANT GROWTH (A)  Final   Report Status 07/10/2015 FINAL  Final    Radiology Reports Ct Abdomen Pelvis Wo Contrast  07/04/2015  CLINICAL DATA:  Anemia, history coronary disease post MI, diabetes mellitus, former smoker, stage III chronic kidney disease, LEFT lung cancer, atrial fibrillation EXAM: CT ABDOMEN AND PELVIS WITHOUT CONTRAST TECHNIQUE:  Multidetector CT imaging of the abdomen and pelvis was performed following the standard protocol without IV contrast. Sagittal and coronal MPR images reconstructed from axial data set. No oral contrast was administered. COMPARISON:  None. CT from PET-CT exam 05/03/2015 FINDINGS: Lower chest:  Bibasilar atelectasis. Hepatobiliary: Cholelithiasis. Beam hardening artifacts from patient's arms traverse liver, no gross hepatic abnormality seen. Pancreas: Atrophic, otherwise unremarkable Spleen: Traversing artifacts, grossly normal appearance Adrenals/Urinary Tract: Normal appearing adrenal glands. Cyst at inferior pole LEFT kidney again identified 3.4 x 3.6 cm image 49. Kidneys otherwise unremarkable without additional mass or hydronephrosis. Single nondilated ureters bilaterally. Normal appearing well distended urinary bladder. Stomach/Bowel: Normal appendix. Retained dense contrast within colon from recent swallowing study. Stomach and bowel loops otherwise normal appearance for technique. Vascular/Lymphatic: Atherosclerotic calcifications aorta, iliac, and femoral arteries. Few coronary arterial calcifications. Low-attenuation of circulating blood consistent with history of anemia. No adenopathy. Reproductive: N/A Other: Small BILATERAL inguinal hernias containing fat. No free air or free fluid. Musculoskeletal: Degenerative disc disease changes L4-L5. No acute osseous findings. IMPRESSION: LEFT renal cyst. Cholelithiasis. Diffuse colonic diverticulosis without gross evidence acute diverticulitis. Small BILATERAL inguinal hernias containing fat. Aortic atherosclerosis. Electronically  Signed   By: Lavonia Dana M.D.   On: 07/04/2015 12:55   Dg Chest 1 View  07/08/2015  CLINICAL DATA:  Altered mental status for 1 day. History of lung carcinoma EXAM: CHEST 1 VIEW COMPARISON:  Chest radiograph July 06, 2015; PET-CT May 03, 2015 FINDINGS: There is again noted opacity in the left mid lung region extending to the left  hilum, likely due to mass with postobstructive pneumonitis. Right lung clear. Heart is upper normal in size with pulmonary vascularity within normal limits. Calcification noted in the aortic arch. Port-A-Cath tip in superior vena cava. No pneumothorax. No bone lesions evident. IMPRESSION: Stable left midlung opacity, likely due to mass with postobstructive pneumonitis. Lungs elsewhere clear. No new opacity. Stable cardiac silhouette. Aortic atherosclerosis. Electronically Signed   By: Lowella Grip III M.D.   On: 07/08/2015 19:57   Dg Chest 2 View  07/02/2015  CLINICAL DATA:  Persistent cough, COPD, lung malignancy. Patient cyst has sustained an intracranial hemorrhage with right hemi [place] is EXAM: CHEST  2 VIEW COMPARISON:  Chest x-ray of June 26, 2015 FINDINGS: The right lung is adequately inflated and exhibits increased density in the infrahilar region. There is some overlap here of the Port-A-Cath reservoir. On the left there is persistent increased density in the perihilar region extending toward the lateral pleural surface. The heart is top-normal in size. There are post CABG changes. Three upper sternal wires are chronically broken. There is calcification in the wall of the aortic arch. There is no pleural effusion or pneumothorax. The Port-A-Cath appliance tip projects over the midportion of the SVC. IMPRESSION: 1. Subtle increased density at the right lung base suggests subsegmental atelectasis. Stable parenchymal density in the left mid lung and left perihilar region. 2. Stable post CABG changes.  No evidence of CHF. Electronically Signed   By: David  Martinique M.D.   On: 07/02/2015 09:40   Dg Chest 2 View  06/26/2015  CLINICAL DATA:  C/o chronic cough. 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 Rales heard in right lower lung. 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 EXAM: CHEST  2 VIEW COMPARISON:  06/25/2015 FINDINGS: There stable changes from prior  CABG surgery. Cardiac silhouette is mildly enlarged. Focal irregular opacity in the posterior perihilar region of the left lung is stable. There are no new areas of lung opacification. No pleural effusion or pneumothorax. Right anterior chest wall Port-A-Cath is stable. Bony thorax is intact. IMPRESSION: 1. No acute cardiopulmonary disease. 2. No change from the prior study. Persistent focal opacity in the perihilar region of the left lung consistent with the reported squamous cell lung carcinoma. Electronically Signed   By: Lajean Manes M.D.   On: 06/26/2015 16:36   Dg Chest 2 View  06/25/2015  CLINICAL DATA:  Cough, left-sided weakness EXAM: CHEST  2 VIEW COMPARISON:  06/24/2015 FINDINGS: Cardiomediastinal silhouette is stable. Right IJ Port-A-Cath is unchanged in position. Persistent left upper lobe consolidation. Bony thorax is unremarkable. IMPRESSION: No significant change. Persistent left upper lobe consolidation. Stable right IJ Port-A-Cath position. Electronically Signed   By: Lahoma Crocker M.D.   On: 06/25/2015 16:54   Ct Head Wo Contrast  06/26/2015  CLINICAL DATA:  Slurred speech. EXAM: CT HEAD WITHOUT CONTRAST TECHNIQUE: Contiguous axial images were obtained from the base of the skull through the vertex without intravenous contrast. COMPARISON:  MRI of June 14, 2015.  CT scan of June 13, 2015. FINDINGS: Bony calvarium appears intact.  Sphenoid sinusitis is noted. Mild diffuse cortical atrophy is noted. There is continued presence of left superior parietal intraparenchymal hemorrhage which now measures 21 x 11 mm and is slightly decreased in size compared to prior exam. Subarachnoid hemorrhage has resolved. There is continued surrounding vasogenic edema. No significant midline shift is noted. Ventricular size is unremarkable. IMPRESSION: Mild diffuse cortical atrophy. Continued presence of left parietal intraparenchymal hemorrhage with surrounding vasogenic edema. This is improved compared to prior  exam. Subarachnoid hemorrhage is no longer present. Electronically Signed   By: Marijo Conception, M.D.   On: 06/26/2015 14:29   Ct Head Wo Contrast  06/13/2015  CLINICAL DATA:  The woke at 5 a.m. with right-sided weakness. EXAM: CT HEAD WITHOUT CONTRAST TECHNIQUE: Contiguous axial images were obtained from the base of the skull through the vertex without intravenous contrast. COMPARISON:  None. FINDINGS: There is intra cerebral hemorrhage noted in the posterior left frontal lobe. Subarachnoid extension. Hemorrhage measures 2.4 x 2.2 x 1.6 cm for a volume of 4.2 mL. Mild surrounding vasogenic edema. No significant mass effect. No midline shift. No hydrocephalus. No acute calvarial abnormality. Air-fluid level in the left sphenoid sinus. Mastoid air cells are clear. IMPRESSION: Left posterior frontal intra cerebral hemorrhage with subarachnoid extension as described above. Critical Value/emergent results were called by telephone at the time of interpretation on 06/13/2015 at 10:02 am to Dr. Loura Pardon , who verbally acknowledged these results. Electronically Signed   By: Rolm Baptise M.D.   On: 06/13/2015 10:04   Mr Jodene Nam Head Wo Contrast  06/15/2015  ADDENDUM REPORT: 06/15/2015 08:11 ADDENDUM: With the rounded contour of the enhancing component of the left frontal lobe hematoma, follow-up until clearance recommended to exclude underlying vascular abnormality. Electronically Signed   By: Genia Del M.D.   On: 06/15/2015 08:11  06/15/2015  CLINICAL DATA:  75 year old diabetic male with history of lung cancer. Right-sided weakness. Subsequent encounter. EXAM: MRI HEAD WITHOUT CONTRAST MRA HEAD WITHOUT CONTRAST TECHNIQUE: Multiplanar, multiecho pulse sequences of the brain and surrounding structures were obtained without intravenous contrast. Angiographic images of the head were obtained using MRA technique without contrast. COMPARISON:  06/13/2015 head CT.  07/28/2014 brain MR. FINDINGS: MRI HEAD FINDINGS Posterior  left frontal lobe complex 2.5 x 2.2 x 2 cm hemorrhagic lesion with marked surrounding vasogenic edema with breakthrough into sulci with subarachnoid hemorrhage noted. Patient's history of lung cancer in addition to 6 mm rounded area of enhanced along the posterior margin of this hemorrhage suggests that this is most likely is related to a hemorrhagic metastatic lesion which has bled. Continued MR surveillance as hemorrhage clears is recommended. There may be a a second enhancing lesion within the anterior left parietal lobe with surrounding vasogenic edema. Venous infarct can have a similar appearance. The major dural sinuses appear patent. No acute thrombotic infarct separate from the above described findings. Remote small left parietal lobe, left frontal lobe, posterior left opercular and tiny right thalamic infarct. Mild small vessel disease changes. Moderate global atrophy without hydrocephalus. Opacification left sphenoid sinus with air-fluid level suggesting acute sinusitis. Mild mucosal thickening ethmoid sinus air cells and minimal mucosal thickening frontal sinuses. Bilateral mastoid air cell and middle ear opacification greater on the left without obstructing lesion of the eustachian tube noted. Post lens replacement without acute orbital abnormality noted. MRA HEAD FINDINGS MR angiogram does not incorporate the left frontal lobe hemorrhage. Mild narrowing supraclinoid aspect internal carotid artery bilaterally with irregularity more notable on the left. Small infundibulum  on the left posterior communicating artery level. Anterior circulation without medium or large size vessel significant stenosis or occlusion. Middle cerebral artery branch vessel irregularity bilaterally. Right vertebral artery ends in a posterior inferior cerebellar artery distribution. No significant narrowing left vertebral artery. Moderate narrowing portions of the left posterior inferior cerebellar artery. Ectatic basilar artery  without high-grade stenosis. Nonvisualized anterior inferior cerebellar arteries. Small left superior cerebellar artery. Posterior cerebral artery distal branch vessel narrowing bilaterally. IMPRESSION: MRI HEAD Posterior left frontal lobe complex 2.5 x 2.2 x 2 cm hemorrhagic lesion with marked surrounding vasogenic edema with breakthrough into sulci with subarachnoid hemorrhage noted. Patient's history of lung cancer in addition to 6 mm rounded area of enhanced along the posterior margin of this hemorrhage suggests that this is most likely is related to a hemorrhagic metastatic lesion which has bled. Continued MR surveillance as hemorrhage clears is recommended. There may be a a second enhancing lesion within the anterior left parietal lobe with surrounding vasogenic edema. Venous infarct can have a similar appearance. The major dural sinuses appear patent. Opacification left sphenoid sinus with air-fluid level suggesting acute sinusitis. Bilateral mastoid air cell and middle ear opacification greater on the left without obstructing lesion of the eustachian tube noted. MRA HEAD MR angiogram does not incorporate the left frontal lobe hemorrhage. Mild narrowing supraclinoid aspect internal carotid artery bilaterally with irregularity more notable on the left. Small infundibulum on the left posterior communicating artery level. Anterior circulation without medium or large size vessel significant stenosis or occlusion. Middle cerebral artery branch vessel irregularity bilaterally. Right vertebral artery ends in a posterior inferior cerebellar artery distribution. No significant narrowing left vertebral artery. Moderate narrowing portions of the left posterior inferior cerebellar artery. Ectatic basilar artery without high-grade stenosis. Nonvisualized anterior inferior cerebellar arteries. Small left superior cerebellar artery. Posterior cerebral artery distal branch vessel narrowing bilaterally. Electronically Signed:  By: Genia Del M.D. On: 06/14/2015 19:34   [name] KD Contrast  06/26/2015  CLINICAL DATA:  Initial evaluation for progressively garbled and slurred speech, with increased right-sided facial droop. EXAM: MRI HEAD WITHOUT AND WITH CONTRAST TECHNIQUE: Multiplanar, multiecho pulse sequences of the brain and surrounding structures were obtained without and with intravenous contrast. CONTRAST:  78m MULTIHANCE GADOBENATE DIMEGLUMINE 529 MG/ML IV SOLN COMPARISON:  Prior CT from earlier the same day as well as previous MRI from 06/14/2015. FINDINGS: Generalized cerebral atrophy with minimal chronic small vessel ischemic changes again noted, stable. Small remote lacunar infarct within the right thalamus. Previously identified posterior left frontal lobe complex hemorrhagic lesion again seen, overall likely decreased in size from previous. Lesion measures 13 x 21 x 16 mm on T2 weighted sequences. Surrounding vasogenic edema is similar. Breakthrough subarachnoid hemorrhage within the adjacent cortical sulci again seen, improved. There is increased peripheral enhancement about the margin of the hemorrhagic lesion, likely related to the evolving hematoma. Previously noted small hemorrhagic nodule is not as clearly delineated on this exam. Note again made of a second possible enhancing lesion within the anterior left parietal lobe along the dura, measuring approximately 9 x 11 x 11 mm. Surrounding vasogenic edema. No other new enhancing lesions. Immediately lateral and inferior to the dominant hemorrhagic lesion. There is a focus of abnormal restricted diffusion involving the cortical gray matter and underlying white matter of the posterior left frontal region, compatible with acute ischemic infarct (series 4, image 35). Associated signal loss on ADC map. This appears to be more vascular distribution rather than seizure related. Additional small  sub cm focus of restricted diffusion more posteriorly within the left  centrum semi ovale. No significant mass effect. Area of infarction closely approximates the hemorrhagic lesion can subarachnoid hemorrhage, but does not appear to be hemorrhagic in of itself. No other acute infarct. Gray-white matter differentiation otherwise maintained. Major intracranial vascular flow voids are preserved. No midline shift. No hydrocephalus. No extra-axial fluid collection. Again, major dural sinuses appear to be grossly patent. Craniocervical junction within normal limits. Visualized upper cervical spine unremarkable. Pituitary gland within normal limits. No acute abnormality about the globes and orbits. Patient is status post lens extraction bilaterally. Mucosal thickening with fluid level within the left sphenoid sinus, similar to prior. Mild scattered opacity within the anterior ethmoidal air cells. Bilateral mastoid effusions present. Visualize bone marrow within normal limits. Scalp soft tissues demonstrate no acute abnormality. IMPRESSION: 1. Acute ischemic left MCA territory infarct adjacent to the posterior left frontal hemorrhagic lesion. Vasospasm as an underlying etiology is suspected given the adjacent complex hemorrhagic lesion and subarachnoid hemorrhage. 2. Continued interval evolution of complex left frontal lobe hemorrhagic lesion with similar vasogenic edema. Associated breakthrough subarachnoid hemorrhage is improved. This lesion now demonstrates peripheral enhancement, which would be expected with an evolving hematoma. Previously noted small nodular focus of enhancement not well delineated on this exam. Again, continued MR surveillance at the hemorrhage clears is recommended to ensure no underlying lesion is present. 3. Additional 9 x 11 x 11 mm enhancing lesion within the anterior left parietal lobe, similar to prior. Attention at follow-up recommended. 4. Acute left sphenoid sinus disease with bilateral mastoid effusions, similar to prior. Electronically Signed   By: Jeannine Boga M.D.   On: 06/26/2015 23:30   [name] JJ Contrast  06/15/2015  ADDENDUM REPORT: 06/15/2015 08:11 ADDENDUM: With the rounded contour of the enhancing component of the left frontal lobe hematoma, follow-up until clearance recommended to exclude underlying vascular abnormality. Electronically Signed   By: Genia Del M.D.   On: 06/15/2015 08:11  06/15/2015  CLINICAL DATA:  75 year old diabetic male with history of lung cancer. Right-sided weakness. Subsequent encounter. EXAM: MRI HEAD WITHOUT CONTRAST MRA HEAD WITHOUT CONTRAST TECHNIQUE: Multiplanar, multiecho pulse sequences of the brain and surrounding structures were obtained without intravenous contrast. Angiographic images of the head were obtained using MRA technique without contrast. COMPARISON:  06/13/2015 head CT.  07/28/2014 brain MR. FINDINGS: MRI HEAD FINDINGS Posterior left frontal lobe complex 2.5 x 2.2 x 2 cm hemorrhagic lesion with marked surrounding vasogenic edema with breakthrough into sulci with subarachnoid hemorrhage noted. Patient's history of lung cancer in addition to 6 mm rounded area of enhanced along the posterior margin of this hemorrhage suggests that this is most likely is related to a hemorrhagic metastatic lesion which has bled. Continued MR surveillance as hemorrhage clears is recommended. There may be a a second enhancing lesion within the anterior left parietal lobe with surrounding vasogenic edema. Venous infarct can have a similar appearance. The major dural sinuses appear patent. No acute thrombotic infarct separate from the above described findings. Remote small left parietal lobe, left frontal lobe, posterior left opercular and tiny right thalamic infarct. Mild small vessel disease changes. Moderate global atrophy without hydrocephalus. Opacification left sphenoid sinus with air-fluid level suggesting acute sinusitis. Mild mucosal thickening ethmoid sinus air cells and minimal mucosal thickening frontal  sinuses. Bilateral mastoid air cell and middle ear opacification greater on the left without obstructing lesion of the eustachian tube noted. Post lens replacement without acute orbital abnormality  noted. MRA HEAD FINDINGS MR angiogram does not incorporate the left frontal lobe hemorrhage. Mild narrowing supraclinoid aspect internal carotid artery bilaterally with irregularity more notable on the left. Small infundibulum on the left posterior communicating artery level. Anterior circulation without medium or large size vessel significant stenosis or occlusion. Middle cerebral artery branch vessel irregularity bilaterally. Right vertebral artery ends in a posterior inferior cerebellar artery distribution. No significant narrowing left vertebral artery. Moderate narrowing portions of the left posterior inferior cerebellar artery. Ectatic basilar artery without high-grade stenosis. Nonvisualized anterior inferior cerebellar arteries. Small left superior cerebellar artery. Posterior cerebral artery distal branch vessel narrowing bilaterally. IMPRESSION: MRI HEAD Posterior left frontal lobe complex 2.5 x 2.2 x 2 cm hemorrhagic lesion with marked surrounding vasogenic edema with breakthrough into sulci with subarachnoid hemorrhage noted. Patient's history of lung cancer in addition to 6 mm rounded area of enhanced along the posterior margin of this hemorrhage suggests that this is most likely is related to a hemorrhagic metastatic lesion which has bled. Continued MR surveillance as hemorrhage clears is recommended. There may be a a second enhancing lesion within the anterior left parietal lobe with surrounding vasogenic edema. Venous infarct can have a similar appearance. The major dural sinuses appear patent. Opacification left sphenoid sinus with air-fluid level suggesting acute sinusitis. Bilateral mastoid air cell and middle ear opacification greater on the left without obstructing lesion of the eustachian tube noted.  MRA HEAD MR angiogram does not incorporate the left frontal lobe hemorrhage. Mild narrowing supraclinoid aspect internal carotid artery bilaterally with irregularity more notable on the left. Small infundibulum on the left posterior communicating artery level. Anterior circulation without medium or large size vessel significant stenosis or occlusion. Middle cerebral artery branch vessel irregularity bilaterally. Right vertebral artery ends in a posterior inferior cerebellar artery distribution. No significant narrowing left vertebral artery. Moderate narrowing portions of the left posterior inferior cerebellar artery. Ectatic basilar artery without high-grade stenosis. Nonvisualized anterior inferior cerebellar arteries. Small left superior cerebellar artery. Posterior cerebral artery distal branch vessel narrowing bilaterally. Electronically Signed: By: Genia Del M.D. On: 06/14/2015 19:34   Dg Chest Port 1 View  07/06/2015  CLINICAL DATA:  COUGH.HX MI,CA EXAM: PORTABLE CHEST 1 VIEW COMPARISON:  07/02/2015 FINDINGS: Heart size is enlarged. Right-sided Port-A-Cath tip to the level of the lower superior vena cava. There is patchy density in the left upper lobe, stable compared with prior study. Mild interstitial edema is noted. IMPRESSION: 1. Persistent left upper lobe density. 2. Mild interstitial edema. Electronically Signed   By: Nolon Nations M.D.   On: 07/06/2015 14:09   Dg Chest Port 1 View  07/09/2015  CLINICAL DATA:  Cough and congestion. EXAM: PORTABLE CHEST 1 VIEW COMPARISON:  June 18, 2015 FINDINGS: There is persistent airspace consolidation in the left upper lobe with volume loss. Lungs elsewhere clear. Heart is upper normal in size with pulmonary vascularity within normal limits. Port-A-Cath tip is in the superior vena cava near the cavoatrial junction, stable. No pneumothorax. No adenopathy evident. Patient is status post coronary artery bypass grafting. IMPRESSION: Persistent left upper lobe  airspace consolidation with volume loss. No change from 3 days prior. Stable cardiac silhouette. Electronically Signed   By: Lowella Grip III M.D.   On: 06/12/2015 07:25   Dg Chest Port 1 View  06/18/2015  CLINICAL DATA:  Cough EXAM: PORTABLE CHEST 1 VIEW COMPARISON:  06/16/2015 FINDINGS: Stable right jugular Port-A-Cath. Low volumes with bibasilar atelectasis. Left mid lung dense pulmonary opacity is stable.  No pneumothorax. IMPRESSION: Stable left mid lung dense pulmonary opacity. Bibasilar atelectasis. Electronically Signed   By: Marybelle Killings M.D.   On: 06/18/2015 15:16   Dg Chest Port 1 View  06/16/2015  CLINICAL DATA:  75 year old male with lung cancer. No chest complaint at this time. EXAM: PORTABLE CHEST 1 VIEW COMPARISON:  Chest radiograph dated 06/15/2015 FINDINGS: There has been interval removal of the endotracheal and enteric tube. The stable area of density is again noted in the left mid lung field. Multiple surgical clips arms in this area. No new consolidation identified. Overall there is improved aeration of the left lower lung field since the prior study. The right lung is clear. There is no pleural effusion or pneumothorax. Stable cardiac silhouette. Median sternotomy wires and CABG vascular clips noted. Right pectoral Port-A-Cath with tip in stable positioning. No acute osseous pathology. IMPRESSION: Interval removal of the endotracheal and enteric tube. Stable appearing density in the left mid lung field with overall improvement of the aeration of the left lung. Electronically Signed   By: Anner Crete M.D.   On: 06/16/2015 19:54   Dg Chest Port 1 View  06/15/2015  CLINICAL DATA:  Acute respiratory failure EXAM: PORTABLE CHEST 1 VIEW COMPARISON:  Portable chest x-ray of 06/14/2015 FINDINGS: Aeration of the lungs has improved somewhat. The tip of the endotracheal tube appears to be approximately 3.8 cm above the carina. Opacity in the left perihilar and left mid lung is unchanged.  Cardiomegaly is stable. Right sided Port-A-Cath is unchanged in position, and right IJ central venous line is unchanged. IMPRESSION: Slightly better aeration. Endotracheal tube tip 3.8 cm above the carina. Electronically Signed   By: Ivar Drape M.D.   On: 06/15/2015 08:05   Dg Chest Port 1 View  06/14/2015  CLINICAL DATA:  Respiratory failure, history of coronary artery disease, GERD, diabetes, lung cancer EXAM: PORTABLE CHEST 1 VIEW COMPARISON:  06/13/2015 FINDINGS: Cardiomediastinal silhouette is stable. Status post median sternotomy. Persistent opacity left hilum and left perihilar region. Post radiation fibrosis left upper lobe again noted. Left basilar atelectasis. Stable endotracheal and NG tube position. Right IJ Port-A-Cath is unchanged in position. Stable right IJ central line. No pneumothorax. No pulmonary edema. IMPRESSION: Stable support apparatus. Persistent opacity left hilum and left perihilar region. Postradiation fibrosis in left upper lobe again noted. Left basilar atelectasis. No pulmonary edema. No pneumothorax. Status post median sternotomy. Electronically Signed   By: Lahoma Crocker M.D.   On: 06/14/2015 08:15   Dg Chest Port 1 View  06/13/2015  CLINICAL DATA:  75 year old male with endotracheal tube placement. EXAM: PORTABLE CHEST 1 VIEW COMPARISON:  Radiograph dated 06/13/2015 FINDINGS: Endotracheal tube with tip approximately 4 cm above the carina. An enteric tube is partially visualized coursing the left hemi abdomen with tip beyond the inferior margin of the image. There is a right IJ central line with tip over the mediastinum. Right pectoral infusion catheter is also noted. There is persistent opacity in the left mid lung field. Multiple surgical clips in the left lower lung field similar to prior study. There is no pneumothorax. The right costophrenic angle has been excluded from the image. The cardiac border is silhouetted. No acute osseous pathology identified. IMPRESSION:  Endotracheal tube above the carina. Stable appearing postsurgical changes and persistent opacity in the left mid lung field. Electronically Signed   By: Anner Crete M.D.   On: 06/13/2015 21:05   Dg Chest Port 1 View  06/13/2015  CLINICAL DATA:  Respiratory  failure and cough. History of left lung squamous carcinoma. EXAM: PORTABLE CHEST 1 VIEW COMPARISON:  05/27/2015 and prior radiographs.  05/03/2015 PET CT FINDINGS: Cardiomegaly, CABG changes and right Port-A-Cath with tip overlying the lower SVC again noted. Left mid lung opacity is stable to slightly increased in size There is no evidence of pneumothorax or pleural effusion. No other changes identified. IMPRESSION: Stable to slightly increased left lung opacity which may represent posttreatment changes. No other significant change. Electronically Signed   By: Margarette Canada M.D.   On: 06/13/2015 18:10   Dg Abd Portable 1v  06/13/2015  CLINICAL DATA:  Orogastric tube placement EXAM: PORTABLE ABDOMEN - 1 VIEW COMPARISON:  PET-CT May 03, 2015 FINDINGS: Orogastric tube tip and side port are in the stomach. Bowel gas pattern is unremarkable. No bowel dilatation or air-fluid level suggesting obstruction. No free air. Moderate stool is noted in the colon. IMPRESSION: Orogastric tube tip and side port in stomach. Bowel gas pattern unremarkable. No free air evident. Electronically Signed   By: Lowella Grip III M.D.   On: 06/13/2015 20:05   Dg Swallowing Func-speech Pathology  06/29/2015  Objective Swallowing Evaluation: Type of Study: MBS-Modified Barium Swallow Study Patient Details Name: JAYSHAWN COLSTON MRN: [id number] Date of Birth: 07-11-1940 Today's Date: 06/29/2015 Time: SLP Start Time (ACUTE ONLY): 0910-SLP Stop Time (ACUTE ONLY): 0930 SLP Time Calculation (min) (ACUTE ONLY): 20 min Past Medical History: Past Medical History Diagnosis Date . Coronary artery disease  . Pneumonia 2000 . Arthritis  . 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 Bruises  easily  . GERD (gastroesophageal reflux disease)    takes Omeprazole daily as needed for stomach pain . Blood transfusion 2001 . Diabetes mellitus    takes Metformin daily; . Impaired hearing    bil hearing aide . Macular degeneration    being watched for this but hasn't been "completely" diagnosed . Myocardial infarction ([place]) 2001 . [facility]  Past Surgical History: Past Surgical History Procedure Laterality Date . Coronary artery bypass graft  2001   4 vessels . Cardiac catheterization  2001 . Tonsillectomy     as a child . Colonoscopy   . Cataract extraction     bilateral . Lumbar laminectomy/decompression microdiscectomy  12/19/2010   Procedure: LUMBAR LAMINECTOMY/DECOMPRESSION MICRODISCECTOMY;  Surgeon: Elaina Hoops;  Location: [facility];  Service: Neurosurgery;  Laterality: N/A;  Lumbar three-four, four-five decompressive lumbar laminectomy . Joint replacement  right tkr . Eye surgery     cataracts . Back surgery     l4 5 . Portacath placement N/A 05/20/2014   Procedure: INSERTION PORT-A-CATH;  Surgeon: Nestor Lewandowsky, MD;  Location: [facility];  Service: General;  Laterality: N/A; HPI: 75 y.o. male patient with PMH: GERD, pna, CAD, DM, MI, cancer and chronic admitted with right-sided hemi-plegia. CT of the head revealed left frontal intracerebral hemorrhage, with subarachnoid hemorrhage as well adjacent to it. Intubated approximately 24-30 hours (extubated 6/6). 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  Objective swallow study ordered to determine safest diet consistencies due to change in function.  No Data Recorded Assessment / Plan / Recommendation CHL IP CLINICAL IMPRESSIONS 06/29/2015 Therapy Diagnosis Moderate oral dysphagia, Moderate pharyngeal dysphagia  Clinical Impression Pt presents with a moderate oropharyngeal dysphagia with both sensory and motor components.  As mentioned on bedside swallow evaluation, pt presents with right sided labial,  lingual, and buccal weakness which impacts  his ability to contain and transit boluses effectively.  This in combination with decreased pharyngeal sensation result in premature spillage of materials into the pharynx with swallow response most consistently triggered at the pyriforms.  Delay in swallow response resulted in aspiration of thin liquids with delayed sensation.  Congested cough which occurred several seconds after aspiration event was ineffective for clearing aspirates from the airway.  Deep penetration was noted x1 over multiple boluses of nectar thick liquids which pt was able to sense and clear from the airway before materials passed below the vocal cords.  Poor base of tongue strength and decreased hyolaryngeal excursion also contributed to moderate pharyngeal residuals post swallow (vallecular residue > pyriforms).  Residue cleared to minimal-mild with cues for extra swallows. Recommend that pt initiate a Dys 1 diet with nectar thick liquids and full supervision for use of swallowing precautions.   Prognosis for advancement good with SLP intervention for pharyngeal strengthening exercises and management of safe diet progression.   Impact on safety and function Moderate aspiration risk     Prognosis 06/29/2015 Prognosis for Safe Diet Advancement Good Barriers to Reach Goals -- Barriers/Prognosis Comment -- CHL IP DIET RECOMMENDATION 06/29/2015 SLP Diet Recommendations Dysphagia 1 (Puree) solids;Nectar thick liquid Liquid Administration via Cup Medication Administration Crushed with puree Compensations Slow rate;Small sips/bites;Clear throat intermittently;Multiple dry swallows after each bite/sip Postural Changes --   No flowsheet data found.         CHL IP ORAL PHASE 06/29/2015 Oral Phase Impaired Oral - Pudding Teaspoon -- Oral - Pudding Cup -- Oral - Honey Teaspoon -- Oral - Honey Cup -- Oral - Nectar Teaspoon Weak lingual manipulation;Right anterior bolus loss;Reduced posterior  propulsion;Lingual/palatal residue;Delayed oral transit;Decreased bolus cohesion;Premature spillage Oral - Nectar Cup Right anterior bolus loss;Weak lingual manipulation;Lingual/palatal residue;Delayed oral transit;Decreased bolus cohesion;Premature spillage;Reduced posterior propulsion Oral - Nectar Straw Weak lingual manipulation;Lingual/palatal residue;Decreased bolus cohesion;Premature spillage Oral - Thin Teaspoon Right anterior bolus loss;Weak lingual manipulation;Reduced posterior propulsion;Lingual/palatal residue;Premature spillage;Delayed oral transit;Decreased bolus cohesion Oral - Thin Cup Weak lingual manipulation;Premature spillage;Lingual/palatal residue;Reduced posterior propulsion;Right anterior bolus loss;Delayed oral transit;Decreased bolus cohesion Oral - Thin Straw -- Oral - Puree Right anterior bolus loss;Weak lingual manipulation;Reduced posterior propulsion;Lingual/palatal residue;Delayed oral transit;Decreased bolus cohesion;Premature spillage Oral - Mech Soft -- Oral - Regular -- Oral - Multi-Consistency -- Oral - Pill -- Oral Phase - Comment --  CHL IP PHARYNGEAL PHASE 06/29/2015 Pharyngeal Phase Impaired Pharyngeal- Pudding Teaspoon -- Pharyngeal -- Pharyngeal- Pudding Cup -- Pharyngeal -- Pharyngeal- Honey Teaspoon -- Pharyngeal -- Pharyngeal- Honey Cup -- Pharyngeal -- Pharyngeal- Nectar Teaspoon Delayed swallow initiation-vallecula;Reduced anterior laryngeal mobility;Reduced laryngeal elevation;Reduced tongue base retraction;Reduced airway/laryngeal closure;Pharyngeal residue - posterior pharnyx;Pharyngeal residue - valleculae Pharyngeal -- Pharyngeal- Nectar Cup Delayed swallow initiation-pyriform sinuses;Reduced anterior laryngeal mobility;Reduced laryngeal elevation;Reduced airway/laryngeal closure;Reduced tongue base retraction;Pharyngeal residue - valleculae;Pharyngeal residue - posterior pharnyx;Penetration/Aspiration during swallow Pharyngeal Material enters airway, CONTACTS  cords and then ejected out Pharyngeal- Nectar Straw Reduced anterior laryngeal mobility;Reduced laryngeal elevation;Reduced airway/laryngeal closure;Reduced tongue base retraction;Pharyngeal residue - valleculae;Pharyngeal residue - posterior pharnyx;Pharyngeal residue - pyriform Pharyngeal -- Pharyngeal- Thin Teaspoon Delayed swallow initiation-vallecula;Reduced anterior laryngeal mobility;Reduced laryngeal elevation;Reduced airway/laryngeal closure;Reduced tongue base retraction;Pharyngeal residue - valleculae;Pharyngeal residue - pyriform;Penetration/Aspiration during swallow Pharyngeal Material enters airway, CONTACTS cords and then ejected out Pharyngeal- Thin Cup Delayed swallow initiation-pyriform sinuses;Reduced anterior laryngeal mobility;Reduced laryngeal elevation;Reduced airway/laryngeal closure;Reduced tongue base retraction;Penetration/Aspiration during swallow;Pharyngeal residue - valleculae;Pharyngeal residue - pyriform Pharyngeal Material enters airway, passes BELOW cords without attempt by patient to eject out (silent  aspiration) Pharyngeal- Thin Straw -- Pharyngeal -- Pharyngeal- Puree Delayed swallow initiation-vallecula;Reduced anterior laryngeal mobility;Reduced laryngeal elevation;Reduced airway/laryngeal closure;Reduced tongue base retraction;Pharyngeal residue - valleculae;Pharyngeal residue - pyriform Pharyngeal -- Pharyngeal- Mechanical Soft -- Pharyngeal -- Pharyngeal- Regular -- Pharyngeal -- Pharyngeal- Multi-consistency -- Pharyngeal -- Pharyngeal- Pill -- Pharyngeal -- Pharyngeal Comment --  No flowsheet data found. No flowsheet data found. Page, [name] 06/29/2015, 3:21 PM                CBC  Recent Labs Lab 07/05/15 0443 07/06/15 0457 07/07/15 0530 07/08/15 0430 07/09/15 0500 07/11/15 0600  WBC 5.4 5.7 5.6  --  6.3 3.9*  HGB 8.9* 8.9* 8.8* 8.9* 9.1* 8.2*  HCT 28.9* 28.4* 28.5* 28.7* 29.4* 26.7*  PLT 193 193 183  --  216 208  MCV 85.5 85.3 85.3  --  88.0 89.0  MCH  26.3 26.7 26.3  --  27.2 27.3  MCHC 30.8 31.3 30.9  --  31.0 30.7  RDW 17.8* 17.3* 17.4*  --  17.3* 16.7*  LYMPHSABS 0.7  --   --   --  0.9 0.6*  MONOABS 0.7  --   --   --  1.0 0.6  EOSABS 0.2  --   --   --  0.2 0.4  BASOSABS 0.0  --   --   --  0.0 0.0    Chemistries   Recent Labs Lab 07/05/15 0443 07/07/15 1430 07/09/15 0500 07/11/15 0600  NA 134* 136 135 138  K 4.5 5.1 4.5 4.3  CL 105 105 103 105  CO2 '22 22 22 24  '$ GLUCOSE 128* 92 99 110*  BUN <5* '10 12 13  '$ CREATININE 1.14 1.51* 1.36* 1.23  CALCIUM 9.6 9.8 9.6 9.2  AST  --  20  --   --   ALT  --  16*  --   --   ALKPHOS  --  102  --   --   BILITOT  --  0.5  --   --    ------------------------------------------------------------------------------------------------------------------ estimated creatinine clearance is 57 mL/min (by C-G formula based on Cr of 1.23). ------------------------------------------------------------------------------------------------------------------ No results for input(s): HGBA1C in the last 72 hours. ------------------------------------------------------------------------------------------------------------------ No results for input(s): CHOL, HDL, LDLCALC, TRIG, CHOLHDL, LDLDIRECT in the last 72 hours. ------------------------------------------------------------------------------------------------------------------ No results for input(s): TSH, T4TOTAL, T3FREE, THYROIDAB in the last 72 hours.  Invalid input(s): FREET3 ------------------------------------------------------------------------------------------------------------------ No results for input(s): VITAMINB12, FOLATE, FERRITIN, TIBC, IRON, RETICCTPCT in the last 72 hours.  Coagulation profile No results for input(s): INR, PROTIME in the last 168 hours.  No results for input(s): DDIMER in the last 72 hours.  Cardiac Enzymes No results for input(s): CKMB, TROPONINI, MYOGLOBIN in the last 168 hours.  Invalid input(s):  CK ------------------------------------------------------------------------------------------------------------------ Invalid input(s): POCBNP   CBG:  Recent Labs Lab 07/10/15 0658 07/10/15 1222 07/10/15 1631 07/10/15 2108 07/11/15 0639  GLUCAP 134* 120* 130* 118* 107*       Studies: No results found.    Lab Results  Component Value Date   HGBA1C 6.8* 06/14/2015   HGBA1C 7.4* 04/17/2015   HGBA1C 8.0* 05/22/2013   Lab Results  Component Value Date   LDLCALC 145* 06/14/2015   CREATININE 1.23 07/11/2015       Scheduled Meds: . amiodarone  200 mg Oral Daily  . antiseptic oral rinse  7 mL Mouth Rinse BID  . atorvastatin  40 mg Oral q1800  . diclofenac sodium  2 g Topical QID  . diltiazem  60 mg Oral  BID  . insulin aspart  0-24 Units Subcutaneous TID AC & HS  . iron polysaccharides  150 mg Oral Daily  . lactose free nutrition  237 mL Oral TID WC  . levETIRAcetam  1,000 mg Intravenous Q12H  . metFORMIN  1,000 mg Oral Q breakfast  . metFORMIN  500 mg Oral Q supper  . pantoprazole sodium  40 mg Oral Daily  . rivaroxaban  15 mg Oral Q supper  . vitamin B-12  1,000 mcg Oral Daily   Continuous Infusions: . sodium chloride 50 mL (07/09/15 2104)     LOS: 20 days    Time spent: >20 [place] [facility] Pager [phone]. If 7PM-7AM, please contact night-coverage at [email or web address], password [facility] 07/11/2015, 9:12 AM  LOS: 20 days

## 2015-07-11 NOTE — Progress Notes (Signed)
Physical Therapy Note  Patient Details  Name: Ivan Beasley MRN: [id number] Date of Birth: [birth year] Today's Date: 07/11/2015    Time: 900-956 56 minutes  1:1 No c/o pain.  Pt performed bed mobility with max A for trunk control, pt able to initiate movements. Pt performed sliding board transfers bed <> w/c and w/c <> mat with +2 mod A. Pt able to initiate leaning to place sliding board and forward lean during transfer, pt unable to control movements and requires max A for balance during lateral leans.  Sitting balance with reaching activity on mat with pt progressing to min guard assist during session.  Scooting edge of mat to assist with sliding board transfers, pt with difficulty using LE mm to wt bear and assist with scooting. Supine NMR with focus on decreasing tone and promoting LE wt bearing in various positions.  Pt in good spirits throughout treatment.   Pharaoh Pio 07/11/2015, 9:56 AM

## 2015-07-12 ENCOUNTER — Ambulatory Visit
Admit: 2015-07-12 | Discharge: 2015-07-12 | Disposition: A | Discharge status: Home / Self Care | Payer: Medicare HMO | Attending: Radiation Oncology | Admitting: Radiation Oncology

## 2015-07-12 ENCOUNTER — Inpatient Hospital Stay (HOSPITAL_COMMUNITY): Payer: Medicare HMO | Admitting: Occupational Therapy

## 2015-07-12 ENCOUNTER — Telehealth: Payer: Self-pay | Admitting: *Deleted

## 2015-07-12 ENCOUNTER — Inpatient Hospital Stay (HOSPITAL_COMMUNITY): Payer: Medicare HMO

## 2015-07-12 ENCOUNTER — Inpatient Hospital Stay (HOSPITAL_COMMUNITY): Payer: Medicare HMO | Admitting: Physical Therapy

## 2015-07-12 ENCOUNTER — Inpatient Hospital Stay (HOSPITAL_COMMUNITY): Payer: Medicare HMO | Admitting: Speech Pathology

## 2015-07-12 DIAGNOSIS — C3492 Malignant neoplasm of unspecified part of left bronchus or lung: Secondary | ICD-10-CM

## 2015-07-12 DIAGNOSIS — C7931 Secondary malignant neoplasm of brain: Secondary | ICD-10-CM

## 2015-07-12 LAB — GLUCOSE, CAPILLARY
GLUCOSE-CAPILLARY: 110 mg/dL — AB (ref 65–99)
Glucose-Capillary: 143 mg/dL — ABNORMAL HIGH (ref 65–99)
Glucose-Capillary: 120 mg/dL — ABNORMAL HIGH (ref 65–99)

## 2015-07-12 MED ORDER — GADOBENATE DIMEGLUMINE 529 MG/ML IV SOLN
20.0000 mL | Freq: Once | INTRAVENOUS | Status: AC | PRN
  Administered 2015-07-12: 20 mL via INTRAVENOUS

## 2015-07-12 NOTE — Progress Notes (Signed)
Occupational Therapy Session Note  Patient Details  Name: Ivan Beasley MRN: [id number] Date of Birth: 1940-07-08  Today's Date: 07/12/2015 OT Individual Time: 0830-0930 OT Individual Time Calculation (min): 60 min    Short Term Goals: Week 3:  OT Short Term Goal 1 (Week 3): STGs = LTGs based on modified LTGs  Skilled Therapeutic Interventions/Progress Updates:    Treatment session with focus on family education, transfers, dynamic sitting balance, and discharge planning.  Pt in bed upon arrival, reporting nursing having cleaned him prior to session and declining bathing at this time.  Donned pants total assist, however pt demonstrating increased initiation and problem solving with attempts to bridge and pull pants over hips.  Pt successfully pulling pants over Lt hip, but therapist assisting with rolling to Lt and then puling up Rt side.  Sidelying to sitting with min assist and increased use of RUE.  Slide board transfer to w/c with total assist to place board and then max assist transfer with manual facilitation for anterior weight shift to promote lift off of buttocks.  Engaged in reaching activity seated at edge of mat with focus on trunk control, midline orientation, and elongation of Lt trunk.  Pt requiring min-mod assist to maintain sitting balance during reaching task, especially when reaching to Rt.  Utilized mirror to provide visual feedback for trunk elongation and shortening as well as midline orientation.  Discussed equipment with wife for upcoming d/c, recommending wide drop arm BSC as pt progresses to continent toileting as well as hospital bed and half lap tray for LUE.    Therapy Documentation Precautions:  Precautions Precautions: Fall Precaution Comments: R hemi, pt on home O2 PTA for recent pneumonia (but used it PRN, not 24/7) Restrictions Weight Bearing Restrictions: No Pain: Pt with no c/o pain  See Function Navigator for Current Functional  Status.   Therapy/Group: Individual Therapy  [name] 07/12/2015, 11:21 AM

## 2015-07-12 NOTE — Progress Notes (Signed)
[facility] PROGRESS NOTE  Ivan Beasley YOV:[id number] DOB: November 26, 1940 DOA: 06/22/2015  PCP: Albina Billet, MD  Assessment/Plan: Principal Problem:   ICH (intracerebral hemorrhage) ([place])- L frontal ICH Active Problems:   Mild chronic obstructive pulmonary disease (HCC)   Seizures ([place]), possible   CKD (chronic kidney disease), stage III   Anemia   Right hemiparesis ([place])   Cerebrovascular accident (CVA) due to embolism of left middle cerebral artery (HCC)   Chronic anticoagulation   Malignant neoplasm of left lung (HCC)   Adjustment disorder with mixed anxiety and depressed mood   Mental confusion   Type 2 diabetes mellitus with peripheral neuropathy (HCC)   Confusion   Right flaccid hemiparesis (HCC)   Acute blood loss anemia   Acute confusional state   Palliative care encounter   Ivan Beasley is a 75 y.o. 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 Consultation requested for anemia   Assessment and plan  - Intermittent confusion - Improved significantly. Repeat brain MRI done today. -Volume depletion - Resolved significantly 1. Anemia - H/H remains stable. Patient is back to Xarelto. CT abdomen is negative for retroperitoneal bleed.   2. Recent Right hemiplegia secondary to left MCA infarct adjacent to left frontal ICH with adjacent subarachnoid hemorrhage while on Eliquis for Atrial fib.  - Due to near resolution of ICH as well as has been >2 weeks, and high risk for recurrent embolic stroke off  anticoagulation and relative small size of infarct, neurology Dr. Erlinda Hong  recommended on 6/20, to consider transition back to [place], wife okay with Xarelto, this can be started , continue to monitor hemoglobin  MRI with and without contrast 06/26/15 can not confirm or rule out metastasis at this time. As per neurology Still need to repeat MRI with and without contrast after blood all absorbed.  3. Atrial fib. - sounds PAF., Currently normal sinus rhythm EKG  shows normal sinus rhythm  CHADsVASc score at least 6, very high risk for recurrent strokes, will need to weigh risk/benefits of resuming anticoagulation after r/o bleeding risk. Patient is back to Xarelto. 4. htn - stable. Optimize.  5. Dm 2 w/ neuropathy - Optimize. 6. Cad, sp cabg - in light of cardiac hx, will trx for Hb >8 at this time 7. 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 UTI enterobacter cloacae- Sensitive to ceftriaxone. Completed 7 day course of antibiotics. Repeat UA, urine culture and CXR (brief episode of confusion reported last night) 9.  Chronic nonproductive cough-last chest x-ray was on 6/23 with focal increased density in the right lung base, will repeat chest x-ray, doubt pneumonia, continue incentive spirometry, aspiration precautions    DVT prophylaxsis : On Xarelto  Code Status:  Full code       Family Communication: Discussed with wife. Disposition Plan: As per Charlett Blake, MD     Consultants:  [facility]  Procedures:   None  Antibiotics: Anti-infectives    Start     Dose/Rate Route Frequency Ordered Stop   06/28/15 1000  cefTRIAXone (ROCEPHIN) 1 g in dextrose 5 % 50 mL IVPB  Status:  Discontinued     1 g 100 mL/hr over 30 Minutes Intravenous Every 24 hours 06/28/15 0847 07/05/15 0834   06/27/15 2100  vancomycin (VANCOCIN) 1,500 mg in sodium chloride 0.9 % 500 mL IVPB  Status:  Discontinued     1,500 mg 250 mL/hr over 120  Minutes Intravenous Every 24 hours 06/26/15 2018 06/28/15 0843      HPI/Subjective: Continues to have a nonproductive cough, otherwise stable, afebrile  Objective: Filed Vitals:   07/11/15 0526 07/11/15 1339 07/11/15 2108 07/12/15 0551  BP: 115/46 124/48 130/60 129/66  Pulse: 87 84  78  Temp: 97.5 F (36.4 C) 98.8 F (37.1 C)  98.5 F (36.9 C)  TempSrc: Oral Oral  Oral  Resp: '18 18  18  '$ Weight:    91.8 kg (202 lb 6.1 oz)  SpO2: 93% 93%  96%    Intake/Output Summary (Last 24 hours) at 07/12/15 0859 Last data filed at 07/12/15 0818  Gross per 24 hour  Intake    360 ml  Output      0 ml  Net    360 ml    Exam:  Examination:  General exam: Appears calm and comfortable  Respiratory system: Clear to auscultation. Respiratory effort normal. Cardiovascular system: S1 & S2 heard, RRR. No JVD, murmurs, rubs, gallops or clicks. No pedal edema. Gastrointestinal system: Abdomen is nondistended, soft and nontender. No organomegaly or masses felt. Normal bowel sounds heard. Central nervous system: Alert and oriented. Right upper extremity weakness Extremities: Symmetric 5 x 5 power. Skin: No rashes, lesions or ulcers Psychiatry: Judgement and insight appear normal. Mood & affect appropriate.     Data Reviewed: I have personally reviewed following labs and imaging studies  Micro Results Recent Results (from the past 240 hour(s))  Culture, Urine     Status: Abnormal   Collection Time: 07/09/15  5:05 AM  Result Value Ref Range Status   Specimen Description URINE, CATHETERIZED  Final   Special Requests NONE  Final   Culture <10,000 COLONIES/mL INSIGNIFICANT GROWTH (A)  Final   Report Status 07/10/2015 FINAL  Final    Radiology Reports Ct Abdomen Pelvis Wo Contrast  07/04/2015  CLINICAL DATA:  Anemia, history coronary disease post MI, diabetes mellitus, former smoker, stage III chronic kidney disease, LEFT lung cancer, atrial fibrillation EXAM: CT ABDOMEN AND PELVIS WITHOUT  CONTRAST TECHNIQUE: Multidetector CT imaging of the abdomen and pelvis was performed following the standard protocol without IV contrast. Sagittal and coronal MPR images reconstructed from axial data set. No oral contrast was administered. COMPARISON:  None. CT from PET-CT exam 05/03/2015 FINDINGS: Lower chest:  Bibasilar atelectasis. Hepatobiliary: Cholelithiasis. Beam hardening artifacts from patient's arms traverse liver, no gross hepatic abnormality seen. Pancreas: Atrophic, otherwise unremarkable Spleen: Traversing artifacts, grossly normal appearance Adrenals/Urinary Tract: Normal appearing adrenal glands. Cyst at inferior pole LEFT kidney again identified 3.4 x 3.6 cm image 49. Kidneys otherwise unremarkable without additional mass or hydronephrosis. Single nondilated ureters bilaterally. Normal appearing well distended urinary bladder. Stomach/Bowel: Normal appendix. Retained dense contrast within colon from recent swallowing study. Stomach and bowel loops otherwise normal appearance for technique. Vascular/Lymphatic: Atherosclerotic calcifications aorta, iliac, and femoral arteries. Few coronary arterial calcifications. Low-attenuation of circulating blood consistent with history of anemia. No adenopathy. Reproductive: N/A Other: Small BILATERAL inguinal hernias containing fat. No free air or free fluid. Musculoskeletal: Degenerative disc disease changes L4-L5. No acute osseous findings. IMPRESSION: LEFT renal cyst. Cholelithiasis. Diffuse colonic diverticulosis without gross evidence acute diverticulitis. Small BILATERAL  inguinal hernias containing fat. Aortic atherosclerosis. Electronically Signed   By: Lavonia Dana M.D.   On: 07/04/2015 12:55   Dg Chest 1 View  07/08/2015  CLINICAL DATA:  Altered mental status for 1 day. History of lung carcinoma EXAM: CHEST 1 VIEW COMPARISON:  Chest radiograph July 06, 2015; PET-CT May 03, 2015 FINDINGS: There is again noted opacity in the left mid lung region  extending to the left hilum, likely due to mass with postobstructive pneumonitis. Right lung clear. Heart is upper normal in size with pulmonary vascularity within normal limits. Calcification noted in the aortic arch. Port-A-Cath tip in superior vena cava. No pneumothorax. No bone lesions evident. IMPRESSION: Stable left midlung opacity, likely due to mass with postobstructive pneumonitis. Lungs elsewhere clear. No new opacity. Stable cardiac silhouette. Aortic atherosclerosis. Electronically Signed   By: Lowella Grip III M.D.   On: 07/08/2015 19:57   Dg Chest 2 View  07/02/2015  CLINICAL DATA:  Persistent cough, COPD, lung malignancy. Patient cyst has sustained an intracranial hemorrhage with right hemi [place] is EXAM: CHEST  2 VIEW COMPARISON:  Chest x-ray of June 26, 2015 FINDINGS: The right lung is adequately inflated and exhibits increased density in the infrahilar region. There is some overlap here of the Port-A-Cath reservoir. On the left there is persistent increased density in the perihilar region extending toward the lateral pleural surface. The heart is top-normal in size. There are post CABG changes. Three upper sternal wires are chronically broken. There is calcification in the wall of the aortic arch. There is no pleural effusion or pneumothorax. The Port-A-Cath appliance tip projects over the midportion of the SVC. IMPRESSION: 1. Subtle increased density at the right lung base suggests subsegmental atelectasis. Stable parenchymal density in the left mid lung and left perihilar region. 2. Stable post CABG changes.  No evidence of CHF. Electronically Signed   By: David  Martinique M.D.   On: 07/02/2015 09:40   Dg Chest 2 View  06/26/2015  CLINICAL DATA:  C/o chronic cough. 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 Rales heard in right lower lung. 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 EXAM: CHEST  2 VIEW COMPARISON:  06/25/2015 FINDINGS: There stable  changes from prior CABG surgery. Cardiac silhouette is mildly enlarged. Focal irregular opacity in the posterior perihilar region of the left lung is stable. There are no new areas of lung opacification. No pleural effusion or pneumothorax. Right anterior chest wall Port-A-Cath is stable. Bony thorax is intact. IMPRESSION: 1. No acute cardiopulmonary disease. 2. No change from the prior study. Persistent focal opacity in the perihilar region of the left lung consistent with the reported squamous cell lung carcinoma. Electronically Signed   By: Lajean Manes M.D.   On: 06/26/2015 16:36   Dg Chest 2 View  06/25/2015  CLINICAL DATA:  Cough, left-sided weakness EXAM: CHEST  2 VIEW COMPARISON:  06/28/2015 FINDINGS: Cardiomediastinal silhouette is stable. Right IJ Port-A-Cath is unchanged in position. Persistent left upper lobe consolidation. Bony thorax is unremarkable. IMPRESSION: No significant change. Persistent left upper lobe consolidation. Stable right IJ Port-A-Cath position. Electronically Signed   By: Lahoma Crocker M.D.   On: 06/25/2015 16:54   Ct Head Wo Contrast  06/26/2015  CLINICAL DATA:  Slurred speech. EXAM: CT HEAD WITHOUT CONTRAST TECHNIQUE: Contiguous axial images were obtained from the base of the skull through the vertex without intravenous contrast. COMPARISON:  MRI of June 14, 2015.  CT scan of June 13, 2015. FINDINGS: Bony calvarium appears intact.  Sphenoid sinusitis is noted. Mild diffuse cortical atrophy is noted. There is continued presence of left superior parietal intraparenchymal hemorrhage which now measures 21 x 11 mm and is slightly decreased in size compared to prior exam. Subarachnoid hemorrhage has resolved. There is continued surrounding vasogenic edema. No significant midline shift is noted. Ventricular size is unremarkable. IMPRESSION: Mild diffuse cortical atrophy. Continued presence of left parietal intraparenchymal hemorrhage with surrounding vasogenic edema. This is improved  compared to prior exam. Subarachnoid hemorrhage is no longer present. Electronically Signed   By: Marijo Conception, M.D.   On: 06/26/2015 14:29   Ct Head Wo Contrast  06/13/2015  CLINICAL DATA:  The woke at 5 a.m. with right-sided weakness. EXAM: CT HEAD WITHOUT CONTRAST TECHNIQUE: Contiguous axial images were obtained from the base of the skull through the vertex without intravenous contrast. COMPARISON:  None. FINDINGS: There is intra cerebral hemorrhage noted in the posterior left frontal lobe. Subarachnoid extension. Hemorrhage measures 2.4 x 2.2 x 1.6 cm for a volume of 4.2 mL. Mild surrounding vasogenic edema. No significant mass effect. No midline shift. No hydrocephalus. No acute calvarial abnormality. Air-fluid level in the left sphenoid sinus. Mastoid air cells are clear. IMPRESSION: Left posterior frontal intra cerebral hemorrhage with subarachnoid extension as described above. Critical Value/emergent results were called by telephone at the time of interpretation on 06/13/2015 at 10:02 am to Dr. Loura Pardon , who verbally acknowledged these results. Electronically Signed   By: Rolm Baptise M.D.   On: 06/13/2015 10:04   Mr Jodene Nam Head Wo Contrast  06/15/2015  ADDENDUM REPORT: 06/15/2015 08:11 ADDENDUM: With the rounded contour of the enhancing component of the left frontal lobe hematoma, follow-up until clearance recommended to exclude underlying vascular abnormality. Electronically Signed   By: Genia Del M.D.   On: 06/15/2015 08:11  06/15/2015  CLINICAL DATA:  75 year old diabetic male with history of lung cancer. Right-sided weakness. Subsequent encounter. EXAM: MRI HEAD WITHOUT CONTRAST MRA HEAD WITHOUT CONTRAST TECHNIQUE: Multiplanar, multiecho pulse sequences of the brain and surrounding structures were obtained without intravenous contrast. Angiographic images of the head were obtained using MRA technique without contrast. COMPARISON:  06/13/2015 head CT.  07/28/2014 brain MR. FINDINGS: MRI HEAD  FINDINGS Posterior left frontal lobe complex 2.5 x 2.2 x 2 cm hemorrhagic lesion with marked surrounding vasogenic edema with breakthrough into sulci with subarachnoid hemorrhage noted. Patient's history of lung cancer in addition to 6 mm rounded area of enhanced along the posterior margin of this hemorrhage suggests that this is most likely is related to a hemorrhagic metastatic lesion which has bled. Continued MR surveillance as hemorrhage clears is recommended. There may be a a second enhancing lesion within the anterior left parietal lobe with surrounding vasogenic edema. Venous infarct can have a similar appearance. The major dural sinuses appear patent. No acute thrombotic infarct separate from the above described findings. Remote small left parietal lobe, left frontal lobe, posterior left opercular and tiny right thalamic infarct. Mild small vessel disease changes. Moderate global atrophy without hydrocephalus. Opacification left sphenoid sinus with air-fluid level suggesting acute sinusitis. Mild mucosal thickening ethmoid sinus air cells and minimal mucosal thickening frontal sinuses. Bilateral mastoid air cell and middle ear opacification greater on the left without obstructing lesion of the eustachian tube noted. Post lens replacement without acute orbital abnormality noted. MRA HEAD FINDINGS MR angiogram does not incorporate the left frontal lobe hemorrhage. Mild narrowing supraclinoid aspect internal carotid artery bilaterally with  irregularity more notable on the left. Small infundibulum on the left posterior communicating artery level. Anterior circulation without medium or large size vessel significant stenosis or occlusion. Middle cerebral artery branch vessel irregularity bilaterally. Right vertebral artery ends in a posterior inferior cerebellar artery distribution. No significant narrowing left vertebral artery. Moderate narrowing portions of the left posterior inferior cerebellar artery. Ectatic  basilar artery without high-grade stenosis. Nonvisualized anterior inferior cerebellar arteries. Small left superior cerebellar artery. Posterior cerebral artery distal branch vessel narrowing bilaterally. IMPRESSION: MRI HEAD Posterior left frontal lobe complex 2.5 x 2.2 x 2 cm hemorrhagic lesion with marked surrounding vasogenic edema with breakthrough into sulci with subarachnoid hemorrhage noted. Patient's history of lung cancer in addition to 6 mm rounded area of enhanced along the posterior margin of this hemorrhage suggests that this is most likely is related to a hemorrhagic metastatic lesion which has bled. Continued MR surveillance as hemorrhage clears is recommended. There may be a a second enhancing lesion within the anterior left parietal lobe with surrounding vasogenic edema. Venous infarct can have a similar appearance. The major dural sinuses appear patent. Opacification left sphenoid sinus with air-fluid level suggesting acute sinusitis. Bilateral mastoid air cell and middle ear opacification greater on the left without obstructing lesion of the eustachian tube noted. MRA HEAD MR angiogram does not incorporate the left frontal lobe hemorrhage. Mild narrowing supraclinoid aspect internal carotid artery bilaterally with irregularity more notable on the left. Small infundibulum on the left posterior communicating artery level. Anterior circulation without medium or large size vessel significant stenosis or occlusion. Middle cerebral artery branch vessel irregularity bilaterally. Right vertebral artery ends in a posterior inferior cerebellar artery distribution. No significant narrowing left vertebral artery. Moderate narrowing portions of the left posterior inferior cerebellar artery. Ectatic basilar artery without high-grade stenosis. Nonvisualized anterior inferior cerebellar arteries. Small left superior cerebellar artery. Posterior cerebral artery distal branch vessel narrowing bilaterally.  Electronically Signed: By: Genia Del M.D. On: 06/14/2015 19:34   [name] YB Contrast  06/26/2015  CLINICAL DATA:  Initial evaluation for progressively garbled and slurred speech, with increased right-sided facial droop. EXAM: MRI HEAD WITHOUT AND WITH CONTRAST TECHNIQUE: Multiplanar, multiecho pulse sequences of the brain and surrounding structures were obtained without and with intravenous contrast. CONTRAST:  39m MULTIHANCE GADOBENATE DIMEGLUMINE 529 MG/ML IV SOLN COMPARISON:  Prior CT from earlier the same day as well as previous MRI from 06/14/2015. FINDINGS: Generalized cerebral atrophy with minimal chronic small vessel ischemic changes again noted, stable. Small remote lacunar infarct within the right thalamus. Previously identified posterior left frontal lobe complex hemorrhagic lesion again seen, overall likely decreased in size from previous. Lesion measures 13 x 21 x 16 mm on T2 weighted sequences. Surrounding vasogenic edema is similar. Breakthrough subarachnoid hemorrhage within the adjacent cortical sulci again seen, improved. There is increased peripheral enhancement about the margin of the hemorrhagic lesion, likely related to the evolving hematoma. Previously noted small hemorrhagic nodule is not as clearly delineated on this exam. Note again made of a second possible enhancing lesion within the anterior left parietal lobe along the dura, measuring approximately 9 x 11 x 11 mm. Surrounding vasogenic edema. No other new enhancing lesions. Immediately lateral and inferior to the dominant hemorrhagic lesion. There is a focus of abnormal restricted diffusion involving the cortical gray matter and underlying white matter of the posterior left frontal region, compatible with acute ischemic infarct (series 4, image 35). Associated signal loss on ADC map. This appears to be more  vascular distribution rather than seizure related. Additional small sub cm focus of restricted diffusion more  posteriorly within the left centrum semi ovale. No significant mass effect. Area of infarction closely approximates the hemorrhagic lesion can subarachnoid hemorrhage, but does not appear to be hemorrhagic in of itself. No other acute infarct. Gray-white matter differentiation otherwise maintained. Major intracranial vascular flow voids are preserved. No midline shift. No hydrocephalus. No extra-axial fluid collection. Again, major dural sinuses appear to be grossly patent. Craniocervical junction within normal limits. Visualized upper cervical spine unremarkable. Pituitary gland within normal limits. No acute abnormality about the globes and orbits. Patient is status post lens extraction bilaterally. Mucosal thickening with fluid level within the left sphenoid sinus, similar to prior. Mild scattered opacity within the anterior ethmoidal air cells. Bilateral mastoid effusions present. Visualize bone marrow within normal limits. Scalp soft tissues demonstrate no acute abnormality. IMPRESSION: 1. Acute ischemic left MCA territory infarct adjacent to the posterior left frontal hemorrhagic lesion. Vasospasm as an underlying etiology is suspected given the adjacent complex hemorrhagic lesion and subarachnoid hemorrhage. 2. Continued interval evolution of complex left frontal lobe hemorrhagic lesion with similar vasogenic edema. Associated breakthrough subarachnoid hemorrhage is improved. This lesion now demonstrates peripheral enhancement, which would be expected with an evolving hematoma. Previously noted small nodular focus of enhancement not well delineated on this exam. Again, continued MR surveillance at the hemorrhage clears is recommended to ensure no underlying lesion is present. 3. Additional 9 x 11 x 11 mm enhancing lesion within the anterior left parietal lobe, similar to prior. Attention at follow-up recommended. 4. Acute left sphenoid sinus disease with bilateral mastoid effusions, similar to prior.  Electronically Signed   By: Jeannine Boga M.D.   On: 06/26/2015 23:30   [name] GQ Contrast  06/15/2015  ADDENDUM REPORT: 06/15/2015 08:11 ADDENDUM: With the rounded contour of the enhancing component of the left frontal lobe hematoma, follow-up until clearance recommended to exclude underlying vascular abnormality. Electronically Signed   By: Genia Del M.D.   On: 06/15/2015 08:11  06/15/2015  CLINICAL DATA:  75 year old diabetic male with history of lung cancer. Right-sided weakness. Subsequent encounter. EXAM: MRI HEAD WITHOUT CONTRAST MRA HEAD WITHOUT CONTRAST TECHNIQUE: Multiplanar, multiecho pulse sequences of the brain and surrounding structures were obtained without intravenous contrast. Angiographic images of the head were obtained using MRA technique without contrast. COMPARISON:  06/13/2015 head CT.  07/28/2014 brain MR. FINDINGS: MRI HEAD FINDINGS Posterior left frontal lobe complex 2.5 x 2.2 x 2 cm hemorrhagic lesion with marked surrounding vasogenic edema with breakthrough into sulci with subarachnoid hemorrhage noted. Patient's history of lung cancer in addition to 6 mm rounded area of enhanced along the posterior margin of this hemorrhage suggests that this is most likely is related to a hemorrhagic metastatic lesion which has bled. Continued MR surveillance as hemorrhage clears is recommended. There may be a a second enhancing lesion within the anterior left parietal lobe with surrounding vasogenic edema. Venous infarct can have a similar appearance. The major dural sinuses appear patent. No acute thrombotic infarct separate from the above described findings. Remote small left parietal lobe, left frontal lobe, posterior left opercular and tiny right thalamic infarct. Mild small vessel disease changes. Moderate global atrophy without hydrocephalus. Opacification left sphenoid sinus with air-fluid level suggesting acute sinusitis. Mild mucosal thickening ethmoid sinus air cells and  minimal mucosal thickening frontal sinuses. Bilateral mastoid air cell and middle ear opacification greater on the left without obstructing lesion of the eustachian tube  noted. Post lens replacement without acute orbital abnormality noted. MRA HEAD FINDINGS MR angiogram does not incorporate the left frontal lobe hemorrhage. Mild narrowing supraclinoid aspect internal carotid artery bilaterally with irregularity more notable on the left. Small infundibulum on the left posterior communicating artery level. Anterior circulation without medium or large size vessel significant stenosis or occlusion. Middle cerebral artery branch vessel irregularity bilaterally. Right vertebral artery ends in a posterior inferior cerebellar artery distribution. No significant narrowing left vertebral artery. Moderate narrowing portions of the left posterior inferior cerebellar artery. Ectatic basilar artery without high-grade stenosis. Nonvisualized anterior inferior cerebellar arteries. Small left superior cerebellar artery. Posterior cerebral artery distal branch vessel narrowing bilaterally. IMPRESSION: MRI HEAD Posterior left frontal lobe complex 2.5 x 2.2 x 2 cm hemorrhagic lesion with marked surrounding vasogenic edema with breakthrough into sulci with subarachnoid hemorrhage noted. Patient's history of lung cancer in addition to 6 mm rounded area of enhanced along the posterior margin of this hemorrhage suggests that this is most likely is related to a hemorrhagic metastatic lesion which has bled. Continued MR surveillance as hemorrhage clears is recommended. There may be a a second enhancing lesion within the anterior left parietal lobe with surrounding vasogenic edema. Venous infarct can have a similar appearance. The major dural sinuses appear patent. Opacification left sphenoid sinus with air-fluid level suggesting acute sinusitis. Bilateral mastoid air cell and middle ear opacification greater on the left without obstructing  lesion of the eustachian tube noted. MRA HEAD MR angiogram does not incorporate the left frontal lobe hemorrhage. Mild narrowing supraclinoid aspect internal carotid artery bilaterally with irregularity more notable on the left. Small infundibulum on the left posterior communicating artery level. Anterior circulation without medium or large size vessel significant stenosis or occlusion. Middle cerebral artery branch vessel irregularity bilaterally. Right vertebral artery ends in a posterior inferior cerebellar artery distribution. No significant narrowing left vertebral artery. Moderate narrowing portions of the left posterior inferior cerebellar artery. Ectatic basilar artery without high-grade stenosis. Nonvisualized anterior inferior cerebellar arteries. Small left superior cerebellar artery. Posterior cerebral artery distal branch vessel narrowing bilaterally. Electronically Signed: By: Genia Del M.D. On: 06/14/2015 19:34   Dg Chest Port 1 View  07/06/2015  CLINICAL DATA:  COUGH.HX MI,CA EXAM: PORTABLE CHEST 1 VIEW COMPARISON:  07/02/2015 FINDINGS: Heart size is enlarged. Right-sided Port-A-Cath tip to the level of the lower superior vena cava. There is patchy density in the left upper lobe, stable compared with prior study. Mild interstitial edema is noted. IMPRESSION: 1. Persistent left upper lobe density. 2. Mild interstitial edema. Electronically Signed   By: Nolon Nations M.D.   On: 07/06/2015 14:09   Dg Chest Port 1 View  06/18/2015  CLINICAL DATA:  Cough and congestion. EXAM: PORTABLE CHEST 1 VIEW COMPARISON:  June 18, 2015 FINDINGS: There is persistent airspace consolidation in the left upper lobe with volume loss. Lungs elsewhere clear. Heart is upper normal in size with pulmonary vascularity within normal limits. Port-A-Cath tip is in the superior vena cava near the cavoatrial junction, stable. No pneumothorax. No adenopathy evident. Patient is status post coronary artery bypass grafting.  IMPRESSION: Persistent left upper lobe airspace consolidation with volume loss. No change from 3 days prior. Stable cardiac silhouette. Electronically Signed   By: Lowella Grip III M.D.   On: 07/08/2015 07:25   Dg Chest Port 1 View  06/18/2015  CLINICAL DATA:  Cough EXAM: PORTABLE CHEST 1 VIEW COMPARISON:  06/16/2015 FINDINGS: Stable right jugular Port-A-Cath. Low volumes with bibasilar atelectasis.  Left mid lung dense pulmonary opacity is stable. No pneumothorax. IMPRESSION: Stable left mid lung dense pulmonary opacity. Bibasilar atelectasis. Electronically Signed   By: Marybelle Killings M.D.   On: 06/18/2015 15:16   Dg Chest Port 1 View  06/16/2015  CLINICAL DATA:  75 year old male with lung cancer. No chest complaint at this time. EXAM: PORTABLE CHEST 1 VIEW COMPARISON:  Chest radiograph dated 06/15/2015 FINDINGS: There has been interval removal of the endotracheal and enteric tube. The stable area of density is again noted in the left mid lung field. Multiple surgical clips arms in this area. No new consolidation identified. Overall there is improved aeration of the left lower lung field since the prior study. The right lung is clear. There is no pleural effusion or pneumothorax. Stable cardiac silhouette. Median sternotomy wires and CABG vascular clips noted. Right pectoral Port-A-Cath with tip in stable positioning. No acute osseous pathology. IMPRESSION: Interval removal of the endotracheal and enteric tube. Stable appearing density in the left mid lung field with overall improvement of the aeration of the left lung. Electronically Signed   By: Anner Crete M.D.   On: 06/16/2015 19:54   Dg Chest Port 1 View  06/15/2015  CLINICAL DATA:  Acute respiratory failure EXAM: PORTABLE CHEST 1 VIEW COMPARISON:  Portable chest x-ray of 06/14/2015 FINDINGS: Aeration of the lungs has improved somewhat. The tip of the endotracheal tube appears to be approximately 3.8 cm above the carina. Opacity in the left  perihilar and left mid lung is unchanged. Cardiomegaly is stable. Right sided Port-A-Cath is unchanged in position, and right IJ central venous line is unchanged. IMPRESSION: Slightly better aeration. Endotracheal tube tip 3.8 cm above the carina. Electronically Signed   By: Ivar Drape M.D.   On: 06/15/2015 08:05   Dg Chest Port 1 View  06/14/2015  CLINICAL DATA:  Respiratory failure, history of coronary artery disease, GERD, diabetes, lung cancer EXAM: PORTABLE CHEST 1 VIEW COMPARISON:  06/13/2015 FINDINGS: Cardiomediastinal silhouette is stable. Status post median sternotomy. Persistent opacity left hilum and left perihilar region. Post radiation fibrosis left upper lobe again noted. Left basilar atelectasis. Stable endotracheal and NG tube position. Right IJ Port-A-Cath is unchanged in position. Stable right IJ central line. No pneumothorax. No pulmonary edema. IMPRESSION: Stable support apparatus. Persistent opacity left hilum and left perihilar region. Postradiation fibrosis in left upper lobe again noted. Left basilar atelectasis. No pulmonary edema. No pneumothorax. Status post median sternotomy. Electronically Signed   By: Lahoma Crocker M.D.   On: 06/14/2015 08:15   Dg Chest Port 1 View  06/13/2015  CLINICAL DATA:  75 year old male with endotracheal tube placement. EXAM: PORTABLE CHEST 1 VIEW COMPARISON:  Radiograph dated 06/13/2015 FINDINGS: Endotracheal tube with tip approximately 4 cm above the carina. An enteric tube is partially visualized coursing the left hemi abdomen with tip beyond the inferior margin of the image. There is a right IJ central line with tip over the mediastinum. Right pectoral infusion catheter is also noted. There is persistent opacity in the left mid lung field. Multiple surgical clips in the left lower lung field similar to prior study. There is no pneumothorax. The right costophrenic angle has been excluded from the image. The cardiac border is silhouetted. No acute osseous  pathology identified. IMPRESSION: Endotracheal tube above the carina. Stable appearing postsurgical changes and persistent opacity in the left mid lung field. Electronically Signed   By: Anner Crete M.D.   On: 06/13/2015 21:05   Dg Chest Port 1  View  06/13/2015  CLINICAL DATA:  Respiratory failure and cough. History of left lung squamous carcinoma. EXAM: PORTABLE CHEST 1 VIEW COMPARISON:  05/27/2015 and prior radiographs.  05/03/2015 PET CT FINDINGS: Cardiomegaly, CABG changes and right Port-A-Cath with tip overlying the lower SVC again noted. Left mid lung opacity is stable to slightly increased in size There is no evidence of pneumothorax or pleural effusion. No other changes identified. IMPRESSION: Stable to slightly increased left lung opacity which may represent posttreatment changes. No other significant change. Electronically Signed   By: Margarette Canada M.D.   On: 06/13/2015 18:10   Dg Abd Portable 1v  06/13/2015  CLINICAL DATA:  Orogastric tube placement EXAM: PORTABLE ABDOMEN - 1 VIEW COMPARISON:  PET-CT May 03, 2015 FINDINGS: Orogastric tube tip and side port are in the stomach. Bowel gas pattern is unremarkable. No bowel dilatation or air-fluid level suggesting obstruction. No free air. Moderate stool is noted in the colon. IMPRESSION: Orogastric tube tip and side port in stomach. Bowel gas pattern unremarkable. No free air evident. Electronically Signed   By: Lowella Grip III M.D.   On: 06/13/2015 20:05   Dg Swallowing Func-speech Pathology  06/29/2015  Objective Swallowing Evaluation: Type of Study: MBS-Modified Barium Swallow Study Patient Details Name: HAYATO GUAMAN MRN: [id number] Date of Birth: Jun 05, 1940 Today's Date: 06/29/2015 Time: SLP Start Time (ACUTE ONLY): 0910-SLP Stop Time (ACUTE ONLY): 0930 SLP Time Calculation (min) (ACUTE ONLY): 20 min Past Medical History: Past Medical History Diagnosis Date . Coronary artery disease  . Pneumonia 2000 . Arthritis  . 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 Bruises easily  . GERD (gastroesophageal reflux disease)    takes Omeprazole daily as needed for stomach pain . Blood transfusion 2001 . Diabetes mellitus    takes Metformin daily; . Impaired hearing    bil hearing aide . Macular degeneration    being watched for this but hasn't been "completely" diagnosed . Myocardial infarction ([place]) 2001 . Cancer [facility])  Past Surgical History: Past Surgical History Procedure Laterality Date . Coronary artery bypass graft  2001   4 vessels . Cardiac catheterization  2001 . Tonsillectomy     as a child . Colonoscopy   . Cataract extraction     bilateral . Lumbar laminectomy/decompression microdiscectomy  12/19/2010   Procedure: LUMBAR LAMINECTOMY/DECOMPRESSION MICRODISCECTOMY;  Surgeon: Elaina Hoops;  Location: [facility];  Service: Neurosurgery;  Laterality: N/A;  Lumbar three-four, four-five decompressive lumbar laminectomy . Joint replacement  right tkr . Eye surgery     cataracts . Back surgery     l4 5 . Portacath placement N/A 05/20/2014   Procedure: INSERTION PORT-A-CATH;  Surgeon: Nestor Lewandowsky, MD;  Location: [facility] ORS;  Service: General;  Laterality: N/A; HPI: 75 y.o. male patient with PMH: GERD, pna, CAD, DM, MI, cancer and chronic admitted with right-sided hemi-plegia. CT of the head revealed left frontal intracerebral hemorrhage, with subarachnoid hemorrhage as well adjacent to it. Intubated approximately 24-30 hours (extubated 6/6). 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  Objective swallow study ordered to determine safest diet consistencies due to change in function.  No Data Recorded Assessment / Plan / Recommendation CHL IP CLINICAL IMPRESSIONS 06/29/2015 Therapy Diagnosis Moderate oral dysphagia, Moderate pharyngeal dysphagia  Clinical Impression Pt presents with a moderate oropharyngeal dysphagia with both sensory and motor components.  As mentioned on bedside swallow evaluation, pt  presents with right  sided labial, lingual, and buccal weakness which impacts his ability to contain and transit boluses effectively.  This in combination with decreased pharyngeal sensation result in premature spillage of materials into the pharynx with swallow response most consistently triggered at the pyriforms.  Delay in swallow response resulted in aspiration of thin liquids with delayed sensation.  Congested cough which occurred several seconds after aspiration event was ineffective for clearing aspirates from the airway.  Deep penetration was noted x1 over multiple boluses of nectar thick liquids which pt was able to sense and clear from the airway before materials passed below the vocal cords.  Poor base of tongue strength and decreased hyolaryngeal excursion also contributed to moderate pharyngeal residuals post swallow (vallecular residue > pyriforms).  Residue cleared to minimal-mild with cues for extra swallows. Recommend that pt initiate a Dys 1 diet with nectar thick liquids and full supervision for use of swallowing precautions.   Prognosis for advancement good with SLP intervention for pharyngeal strengthening exercises and management of safe diet progression.   Impact on safety and function Moderate aspiration risk     Prognosis 06/29/2015 Prognosis for Safe Diet Advancement Good Barriers to Reach Goals -- Barriers/Prognosis Comment -- CHL IP DIET RECOMMENDATION 06/29/2015 SLP Diet Recommendations Dysphagia 1 (Puree) solids;Nectar thick liquid Liquid Administration via Cup Medication Administration Crushed with puree Compensations Slow rate;Small sips/bites;Clear throat intermittently;Multiple dry swallows after each bite/sip Postural Changes --   No flowsheet data found.         CHL IP ORAL PHASE 06/29/2015 Oral Phase Impaired Oral - Pudding Teaspoon -- Oral - Pudding Cup -- Oral - Honey Teaspoon -- Oral - Honey Cup -- Oral - Nectar Teaspoon Weak lingual manipulation;Right anterior bolus loss;Reduced  posterior propulsion;Lingual/palatal residue;Delayed oral transit;Decreased bolus cohesion;Premature spillage Oral - Nectar Cup Right anterior bolus loss;Weak lingual manipulation;Lingual/palatal residue;Delayed oral transit;Decreased bolus cohesion;Premature spillage;Reduced posterior propulsion Oral - Nectar Straw Weak lingual manipulation;Lingual/palatal residue;Decreased bolus cohesion;Premature spillage Oral - Thin Teaspoon Right anterior bolus loss;Weak lingual manipulation;Reduced posterior propulsion;Lingual/palatal residue;Premature spillage;Delayed oral transit;Decreased bolus cohesion Oral - Thin Cup Weak lingual manipulation;Premature spillage;Lingual/palatal residue;Reduced posterior propulsion;Right anterior bolus loss;Delayed oral transit;Decreased bolus cohesion Oral - Thin Straw -- Oral - Puree Right anterior bolus loss;Weak lingual manipulation;Reduced posterior propulsion;Lingual/palatal residue;Delayed oral transit;Decreased bolus cohesion;Premature spillage Oral - Mech Soft -- Oral - Regular -- Oral - Multi-Consistency -- Oral - Pill -- Oral Phase - Comment --  CHL IP PHARYNGEAL PHASE 06/29/2015 Pharyngeal Phase Impaired Pharyngeal- Pudding Teaspoon -- Pharyngeal -- Pharyngeal- Pudding Cup -- Pharyngeal -- Pharyngeal- Honey Teaspoon -- Pharyngeal -- Pharyngeal- Honey Cup -- Pharyngeal -- Pharyngeal- Nectar Teaspoon Delayed swallow initiation-vallecula;Reduced anterior laryngeal mobility;Reduced laryngeal elevation;Reduced tongue base retraction;Reduced airway/laryngeal closure;Pharyngeal residue - posterior pharnyx;Pharyngeal residue - valleculae Pharyngeal -- Pharyngeal- Nectar Cup Delayed swallow initiation-pyriform sinuses;Reduced anterior laryngeal mobility;Reduced laryngeal elevation;Reduced airway/laryngeal closure;Reduced tongue base retraction;Pharyngeal residue - valleculae;Pharyngeal residue - posterior pharnyx;Penetration/Aspiration during swallow Pharyngeal Material enters airway,  CONTACTS cords and then ejected out Pharyngeal- Nectar Straw Reduced anterior laryngeal mobility;Reduced laryngeal elevation;Reduced airway/laryngeal closure;Reduced tongue base retraction;Pharyngeal residue - valleculae;Pharyngeal residue - posterior pharnyx;Pharyngeal residue - pyriform Pharyngeal -- Pharyngeal- Thin Teaspoon Delayed swallow initiation-vallecula;Reduced anterior laryngeal mobility;Reduced laryngeal elevation;Reduced airway/laryngeal closure;Reduced tongue base retraction;Pharyngeal residue - valleculae;Pharyngeal residue - pyriform;Penetration/Aspiration during swallow Pharyngeal Material enters airway, CONTACTS cords and then ejected out Pharyngeal- Thin Cup Delayed swallow initiation-pyriform sinuses;Reduced anterior laryngeal mobility;Reduced laryngeal elevation;Reduced airway/laryngeal closure;Reduced tongue base retraction;Penetration/Aspiration during swallow;Pharyngeal residue - valleculae;Pharyngeal residue - pyriform Pharyngeal Material enters airway, passes BELOW cords  without attempt by patient to eject out (silent aspiration) Pharyngeal- Thin Straw -- Pharyngeal -- Pharyngeal- Puree Delayed swallow initiation-vallecula;Reduced anterior laryngeal mobility;Reduced laryngeal elevation;Reduced airway/laryngeal closure;Reduced tongue base retraction;Pharyngeal residue - valleculae;Pharyngeal residue - pyriform Pharyngeal -- Pharyngeal- Mechanical Soft -- Pharyngeal -- Pharyngeal- Regular -- Pharyngeal -- Pharyngeal- Multi-consistency -- Pharyngeal -- Pharyngeal- Pill -- Pharyngeal -- Pharyngeal Comment --  No flowsheet data found. No flowsheet data found. Page, [name] 06/29/2015, 3:21 PM                CBC  Recent Labs Lab 07/06/15 0457 07/07/15 0530 07/08/15 0430 07/09/15 0500 07/11/15 0600  WBC 5.7 5.6  --  6.3 3.9*  HGB 8.9* 8.8* 8.9* 9.1* 8.2*  HCT 28.4* 28.5* 28.7* 29.4* 26.7*  PLT 193 183  --  216 208  MCV 85.3 85.3  --  88.0 89.0  MCH 26.7 26.3  --  27.2 27.3  MCHC  31.3 30.9  --  31.0 30.7  RDW 17.3* 17.4*  --  17.3* 16.7*  LYMPHSABS  --   --   --  0.9 0.6*  MONOABS  --   --   --  1.0 0.6  EOSABS  --   --   --  0.2 0.4  BASOSABS  --   --   --  0.0 0.0    Chemistries   Recent Labs Lab 07/07/15 1430 07/09/15 0500 07/11/15 0600  NA 136 135 138  K 5.1 4.5 4.3  CL 105 103 105  CO2 '22 22 24  '$ GLUCOSE 92 99 110*  BUN '10 12 13  '$ CREATININE 1.51* 1.36* 1.23  CALCIUM 9.8 9.6 9.2  AST 20  --   --   ALT 16*  --   --   ALKPHOS 102  --   --   BILITOT 0.5  --   --    ------------------------------------------------------------------------------------------------------------------ estimated creatinine clearance is 57 mL/min (by C-G formula based on Cr of 1.23). ------------------------------------------------------------------------------------------------------------------ No results for input(s): HGBA1C in the last 72 hours. ------------------------------------------------------------------------------------------------------------------ No results for input(s): CHOL, HDL, LDLCALC, TRIG, CHOLHDL, LDLDIRECT in the last 72 hours. ------------------------------------------------------------------------------------------------------------------ No results for input(s): TSH, T4TOTAL, T3FREE, THYROIDAB in the last 72 hours.  Invalid input(s): FREET3 ------------------------------------------------------------------------------------------------------------------ No results for input(s): VITAMINB12, FOLATE, FERRITIN, TIBC, IRON, RETICCTPCT in the last 72 hours.  Coagulation profile No results for input(s): INR, PROTIME in the last 168 hours.  No results for input(s): DDIMER in the last 72 hours.  Cardiac Enzymes No results for input(s): CKMB, TROPONINI, MYOGLOBIN in the last 168 hours.  Invalid input(s): CK ------------------------------------------------------------------------------------------------------------------ Invalid input(s):  POCBNP   CBG:  Recent Labs Lab 07/11/15 0639 07/11/15 1128 07/11/15 1628 07/11/15 2116 07/12/15 0649  GLUCAP 107* 125* 159* 107* 143*       Studies: No results found.    Lab Results  Component Value Date   HGBA1C 6.8* 06/14/2015   HGBA1C 7.4* 04/17/2015   HGBA1C 8.0* 05/22/2013   Lab Results  Component Value Date   LDLCALC 145* 06/14/2015   CREATININE 1.23 07/11/2015       Scheduled Meds: . amiodarone  200 mg Oral Daily  . antiseptic oral rinse  7 mL Mouth Rinse BID  . atorvastatin  40 mg Oral q1800  . diclofenac sodium  2 g Topical QID  . diltiazem  60 mg Oral BID  . insulin aspart  0-24 Units Subcutaneous TID AC & HS  . iron polysaccharides  150 mg Oral Daily  . lactose free nutrition  237 mL Oral TID WC  . levETIRAcetam  1,000 mg Intravenous Q12H  . metFORMIN  1,000 mg Oral Q breakfast  . metFORMIN  500 mg Oral Q supper  . pantoprazole sodium  40 mg Oral Daily  . rivaroxaban  15 mg Oral Q supper  . vitamin B-12  1,000 mcg Oral Daily   Continuous Infusions: . sodium chloride 50 mL (07/09/15 2104)     LOS: 21 days    Time spent: >20 [place] Pager [phone]. If 7PM-7AM, please contact night-coverage at [email or web address], password [facility] 07/12/2015, 8:59 AM  LOS: 21 days

## 2015-07-12 NOTE — Progress Notes (Addendum)
Palliative Medicine RN Note: Lengthy discussion w wife. She states goal is to be at home and not return to hospital; pt told her that he can tell the end of his life is near and wants to be at home when he does die. It is important for him to see his dog and grandson. Wife pleased with therapy progression. We discussed benefits and drawbacks of hospice vs HH after d/c, and she agrees with hospice being the better choice based on their stated goals. Rad-onc PA Bryson Ha states that pt is not a candidate for XRT to head now, even if mets are suspected d/t possible inflammation d/t CVAs. Wife verbalizes understanding and states she may end up not wanting to proceed with work up at that time.  Of note, wife states pt is adamant that he will NOT take any more chemo.  Spoke with Education officer, museum and with intake at [facility]. Records faxed to [phone]. Plan f/u by PMT prn.  Marjie Skiff Evelyn Moch, RN, BSN, [facility] 07/12/2015 1:07 PM Cell [phone] 8:00-4:00 Monday-Friday Office [phone]

## 2015-07-12 NOTE — Telephone Encounter (Signed)
Asking if there is any medical intervention for his cancer with all the problems he has been having. Please call to discuss.

## 2015-07-12 NOTE — Progress Notes (Signed)
SLP Cancellation Note  Patient Details Name: IMRAAN WENDELL MRN: [id number] DOB: May 16, 1940   Cancelled treatment:        Attempted to see pt for skilled ST; however, pt is off unit for MRI.  As a result, pt missed 45 minutes of skilled ST.  Therapist will continue to follow up as appropriate.                                                                                                 Nimsi Males, [name] 07/12/2015, 2:33 PM

## 2015-07-12 NOTE — Progress Notes (Signed)
Physical Therapy Session Note  Patient Details  Name: Ivan Beasley MRN: [id number] Date of Birth: [birth year]  Today's Date: 07/12/2015 PT Individual Time: 1000-1100 PT Individual Time Calculation (min): 60 min   Short Term Goals: Week 3:  PT Short Term Goal 1 (Week 3): =LTG due to estimated length of stay  Skilled Therapeutic Interventions/Progress Updates:    Pt received seated in w/c, denies pain and agreeable to treatment. Gait 2x30' with three muskateers +2 A with totalA for RLE progression and stance control. Returned to room and pt's wife performed lateral scoot with transfer board w/c>bed with maxA; no cues needed for wife to setup or perform transfer. Transfer w/c<>bed with hoyer lift to prepare pt for potential d/c home. Rolling R/L minA with bedrails to don sling, then therapist with rehab tech performed transfer bed>w/c. Wife performed transfer w/c>bed with mod verbal cues and assist for problem solving use of hoyer lift. Discussed DME needs with pt and wife; plan to follow up with CSW tomorrow regarding w/c, transfer board, hoyer lift, etc to prepare for d/c. [name] reports her son-in-law is building a ramp for home entry, but will likely need ambulance transport home due to difficulty transferring into/out of car. Pt remained supine in bed at completion of session, all needs in reach and wife present.   Therapy Documentation Precautions:  Precautions Precautions: Fall Precaution Comments: R hemi, pt on home O2 PTA for recent pneumonia (but used it PRN, not 24/7) Restrictions Weight Bearing Restrictions: No Pain: Pain Assessment Pain Assessment: No/denies pain Pain Score: 0-No pain Faces Pain Scale: No hurt   See Function Navigator for Current Functional Status.   Therapy/Group: Individual Therapy  Luberta Mutter 07/12/2015, 1:20 PM

## 2015-07-12 NOTE — Telephone Encounter (Signed)
Informed Ivan Beasley per Dr Donzetta Matters that she should discuss with the rehab doctor to order an oncology consult re treatment planning. She said she will discuss this with family

## 2015-07-12 NOTE — Progress Notes (Signed)
Subjective/Complaints: Pt sitting up, eating breakfast with supervision.  He states he had a great weekend.   Review of systems: limited by dysarthria and ?aphasia, however, appears to deny CP, SOB, N/V/D.  Objective: Vital Signs: Blood pressure 129/66, pulse 78, temperature 98.5 F (36.9 C), temperature source Oral, resp. rate 18, weight 91.8 kg (202 lb 6.1 oz), SpO2 96 %. No results found. Results for orders placed or performed during the hospital encounter of 06/23/2015 (from the past 72 hour(s))  Glucose, capillary     Status: Abnormal   Collection Time: 07/09/15  1:16 PM  Result Value Ref Range   Glucose-Capillary 115 (H) 65 - 99 mg/dL  Glucose, capillary     Status: Abnormal   Collection Time: 07/09/15  5:38 PM  Result Value Ref Range   Glucose-Capillary 134 (H) 65 - 99 mg/dL  Glucose, capillary     Status: Abnormal   Collection Time: 07/09/15  6:16 PM  Result Value Ref Range   Glucose-Capillary 139 (H) 65 - 99 mg/dL  Glucose, capillary     Status: Abnormal   Collection Time: 07/09/15  9:37 PM  Result Value Ref Range   Glucose-Capillary 120 (H) 65 - 99 mg/dL   Comment 1 Notify RN   Glucose, capillary     Status: Abnormal   Collection Time: 07/10/15  6:58 AM  Result Value Ref Range   Glucose-Capillary 134 (H) 65 - 99 mg/dL  Glucose, capillary     Status: Abnormal   Collection Time: 07/10/15 12:22 PM  Result Value Ref Range   Glucose-Capillary 120 (H) 65 - 99 mg/dL  Glucose, capillary     Status: Abnormal   Collection Time: 07/10/15  4:31 PM  Result Value Ref Range   Glucose-Capillary 130 (H) 65 - 99 mg/dL  Glucose, capillary     Status: Abnormal   Collection Time: 07/10/15  9:08 PM  Result Value Ref Range   Glucose-Capillary 118 (H) 65 - 99 mg/dL  Occult blood card to lab, stool RN will collect     Status: None   Collection Time: 07/10/15 10:06 PM  Result Value Ref Range   Fecal Occult Bld NEGATIVE NEGATIVE  Renal function panel     Status: Abnormal   Collection Time: 07/11/15  6:00 AM  Result Value Ref Range   Sodium 138 135 - 145 mmol/L   Potassium 4.3 3.5 - 5.1 mmol/L   Chloride 105 101 - 111 mmol/L   CO2 24 22 - 32 mmol/L   Glucose, Bld 110 (H) 65 - 99 mg/dL   BUN 13 6 - 20 mg/dL   Creatinine, Ser 1.23 0.61 - 1.24 mg/dL   Calcium 9.2 8.9 - 10.3 mg/dL   Phosphorus 3.5 2.5 - 4.6 mg/dL   Albumin 2.6 (L) 3.5 - 5.0 g/dL   GFR calc non Af Amer 56 (L) >60 mL/min   GFR calc Af Amer >60 >60 mL/min    Comment: (NOTE) The eGFR has been calculated using the CKD EPI equation. This calculation has not been validated in all clinical situations. eGFR's persistently <60 mL/min signify possible Chronic Kidney Disease.    Anion gap 9 5 - 15  CBC with Differential/Platelet     Status: Abnormal   Collection Time: 07/11/15  6:00 AM  Result Value Ref Range   WBC 3.9 (L) 4.0 - 10.5 K/uL   RBC 3.00 (L) 4.22 - 5.81 MIL/uL   Hemoglobin 8.2 (L) 13.0 - 17.0 g/dL   HCT 26.7 (L) 39.0 -  52.0 %   MCV 89.0 78.0 - 100.0 fL   MCH 27.3 26.0 - 34.0 pg   MCHC 30.7 30.0 - 36.0 g/dL   RDW 16.7 (H) 11.5 - 15.5 %   Platelets 208 150 - 400 K/uL   Neutrophils Relative % 59 %   Neutro Abs 2.3 1.7 - 7.7 K/uL   Lymphocytes Relative 16 %   Lymphs Abs 0.6 (L) 0.7 - 4.0 K/uL   Monocytes Relative 16 %   Monocytes Absolute 0.6 0.1 - 1.0 K/uL   Eosinophils Relative 9 %   Eosinophils Absolute 0.4 0.0 - 0.7 K/uL   Basophils Relative 0 %   Basophils Absolute 0.0 0.0 - 0.1 K/uL  Glucose, capillary     Status: Abnormal   Collection Time: 07/11/15  6:39 AM  Result Value Ref Range   Glucose-Capillary 107 (H) 65 - 99 mg/dL  Glucose, capillary     Status: Abnormal   Collection Time: 07/11/15 11:28 AM  Result Value Ref Range   Glucose-Capillary 125 (H) 65 - 99 mg/dL   Comment 1 Notify RN   Glucose, capillary     Status: Abnormal   Collection Time: 07/11/15  4:28 PM  Result Value Ref Range   Glucose-Capillary 159 (H) 65 - 99 mg/dL  Glucose, capillary     Status:  Abnormal   Collection Time: 07/11/15  9:16 PM  Result Value Ref Range   Glucose-Capillary 107 (H) 65 - 99 mg/dL   Comment 1 Notify RN    Comment 2 Document in Chart   Glucose, capillary     Status: Abnormal   Collection Time: 07/12/15  6:49 AM  Result Value Ref Range   Glucose-Capillary 143 (H) 65 - 99 mg/dL     HEENT: Normocephalic. Atraumatic Cardio: irregular irregular Resp: CTA B/L and unlabored. No wheezes or rales GI: BS positive and nontender nondistended Skin:   Intact. Warm and dry. Neuro: Flat Motor RUE/RLE: remains 0/5 MAS: left elbow flexion 1+/4, finger flexors 1/4 Moving LUE/LUE without difficulty Dysarthric and Aphasic Right facial weakness Musc/Skel:  No edema. No tenderness. Gen. no acute distress.    Assessment/Plan: 1. 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 Physiatrist is providing close team supervision and 24 hour management of active medical problems listed below. Physiatrist and rehab team continue to assess barriers to discharge/monitor patient progress toward functional and medical goals. FIM: Function - Bathing Position: Bed Body parts bathed by patient: Chest, Abdomen, Right arm, Front perineal area Body parts bathed by helper: Back, Buttocks, Right upper leg, Left upper leg, Right lower leg, Left lower leg Bathing not applicable: Right upper leg, Left upper leg, Right lower leg, Left lower leg Assist Level: 2 helpers  Function- Upper Body Dressing/Undressing What is the patient wearing?: Hospital gown Pull over shirt/dress - Perfomed by patient: Thread/unthread left sleeve, Put head through opening Pull over shirt/dress - Perfomed by helper: Thread/unthread right sleeve, Pull shirt over trunk Assist Level: 2 helpers Function - Lower Body Dressing/Undressing What is the patient wearing?: Pants,  Non-skid slipper socks Position: Bed Pants- Performed by helper: Thread/unthread right pants leg, Thread/unthread left pants leg, Pull pants up/down Non-skid slipper socks- Performed by helper: Don/doff right sock, Don/doff left sock Assist for footwear: Dependant Assist for lower body dressing: 2 Helpers  Function - Toileting Toileting activity did not occur: No continent bowel/bladder event Assist level: Two helpers (  per Courtney Paris, NT)  Function - Toilet Transfers Toilet transfer activity did not occur: Safety/medical concerns Assist level to toilet: 2 helpers (per Berkley Harvey, NT) Assist level from toilet: 2 helpers  Function - Chair/bed transfer Chair/bed transfer activity did not occur: Safety/medical concerns Chair/bed transfer method: Lateral scoot Chair/bed transfer assist level: 2 helpers Chair/bed transfer assistive device: Sliding board Mechanical lift: Stedy Chair/bed transfer details: Verbal cues for precautions/safety, Verbal cues for sequencing, Verbal cues for technique  Function - Locomotion: Wheelchair Will patient use wheelchair at discharge?: Yes Type: Manual Max wheelchair distance: 10 Assist Level: Dependent (Pt equals 0%) Assist Level: Maximal assistance (Pt 25 - 49%) Wheel 150 feet activity did not occur: Safety/medical concerns Turns around,maneuvers to table,bed, and toilet,negotiates 3% grade,maneuvers on rugs and over doorsills: No Function - Locomotion: Ambulation Ambulation activity did not occur: Safety/medical concerns Assistive device: Other (comment) (three muskateers) Max distance: 25 Assist level: 2 helpers Assist level: 2 helpers  Function - Comprehension Comprehension: Auditory Comprehension assistive device: Hearing aids Comprehension assist level: Understands basic 75 - 89% of the time/ requires cueing 10 - 24% of the time  Function - Expression Expression: Verbal Expression assist level: Expresses basic 50 - 74% of the  time/requires cueing 25 - 49% of the time. Needs to repeat parts of sentences.  Function - Social Interaction Social Interaction assist level: Interacts appropriately 50 - 74% of the time - May be physically or verbally inappropriate.  Function - Problem Solving Problem solving assist level: Solves basic 50 - 74% of the time/requires cueing 25 - 49% of the time  Function - Memory Memory assist level: Recognizes or recalls 50 - 74% of the time/requires cueing 25 - 49% of the time Patient normally able to recall (first 3 days only): That he or she is in a hospital, Staff names and faces    Medical Problem List and Plan: 1.  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   Cont CIR  Will order repeat MRI today with neurology to follow up, Xarelto for stroke prophylaxis  Orthotics for RUE 2.  DVT Prophylaxis/Anticoagulation: SCDs. Monitor for signs of DVT, Xarelto should help with this as well 3. Pain Management: Ultram and oxycodone as needed 4. Mood: Xanax 0.25 mg 3 times daily as needed 5. Neuropsych: This patient is not capable of making decisions on his own behalf with caregiver/staff assistance. 6. Skin/Wound Care: Routine skin checks 7. Fluids/Electrolytes/Nutrition: Routine I&O's with follow-up chemistries,severe swallowing difficulties, meal intake usually around 75%   8. Seizure prophylaxis. Keppra now at 1000 mg twice a day---more alert. EEG displayed seizure activity 9. Atrial fibrillation. Cardiac rate control. Continue amiodarone 200 mg daily, Cardizem 60 mg twice a day.Eliquis held secondary to [place], family does not want to resume, would be comfortable with Xarelto , Initiated low-dose due to stable hemoglobin 10. 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 This is likely the etiology of the persistent cough, Right lung nodules,As noted plan follow-up MRI after ICH absorbed  for further evaluation of possible metastasis 11. Diabetes mellitus with peripheral neuropathy. Hemoglobin A1c 6.8. Glucophage 1000 mg at breakfast and 500 mg supper ,  Amaryl 1 mg daily. Check blood sugars before meals and at bedtime Generally well controlled on the current regimen CBG (last 3)   Recent Labs  07/11/15 1628 07/11/15 2116 07/12/15 0649  GLUCAP 159* 107* 143*   12. Hyperlipidemia. Lipitor 13. UTI enterobacter  cloacae- Sensitive to ceftriaxone. Started 1 g every 24 hours.  Afebrile , 7 day course    Completed.    14. Anemia stool guaiac negative X 2,    As per internal medicine now on Xarelto. This was also Recommended by neurology.  Hemoglobin stable at 8.2 on 7/3.    Cont to monitor 15. CKD  Cr. 1.23 on 7/3    LOS (Days) 21 A FACE TO FACE EVALUATION WAS PERFORMED  Rafferty Postlewait Lorie Phenix 07/12/2015, 9:43 AM

## 2015-07-13 ENCOUNTER — Other Ambulatory Visit: Payer: Self-pay | Admitting: Nurse Practitioner

## 2015-07-13 ENCOUNTER — Inpatient Hospital Stay (HOSPITAL_COMMUNITY): Payer: Medicare HMO | Admitting: Physical Therapy

## 2015-07-13 ENCOUNTER — Inpatient Hospital Stay (HOSPITAL_COMMUNITY): Payer: Medicare HMO | Admitting: Occupational Therapy

## 2015-07-13 ENCOUNTER — Inpatient Hospital Stay (HOSPITAL_COMMUNITY): Payer: Medicare HMO | Admitting: Speech Pathology

## 2015-07-13 DIAGNOSIS — IMO0001 Reserved for inherently not codable concepts without codable children: Secondary | ICD-10-CM | POA: Insufficient documentation

## 2015-07-13 DIAGNOSIS — R030 Elevated blood-pressure reading, without diagnosis of hypertension: Secondary | ICD-10-CM

## 2015-07-13 DIAGNOSIS — M549 Dorsalgia, unspecified: Secondary | ICD-10-CM | POA: Insufficient documentation

## 2015-07-13 DIAGNOSIS — C7931 Secondary malignant neoplasm of brain: Secondary | ICD-10-CM

## 2015-07-13 DIAGNOSIS — I610 Nontraumatic intracerebral hemorrhage in hemisphere, subcortical: Secondary | ICD-10-CM | POA: Insufficient documentation

## 2015-07-13 DIAGNOSIS — M5489 Other dorsalgia: Secondary | ICD-10-CM

## 2015-07-13 LAB — GLUCOSE, CAPILLARY
GLUCOSE-CAPILLARY: 130 mg/dL — AB (ref 65–99)
GLUCOSE-CAPILLARY: 144 mg/dL — AB (ref 65–99)
GLUCOSE-CAPILLARY: 131 mg/dL — AB (ref 65–99)
Glucose-Capillary: 123 mg/dL — ABNORMAL HIGH (ref 65–99)

## 2015-07-13 LAB — TROPONIN I
Troponin I: <0.03 ng/mL (ref <0.03)
Troponin I: <0.03 ng/mL (ref <0.03)

## 2015-07-13 LAB — LIPASE, BLOOD: Lipase: 15 U/L (ref 11–51)

## 2015-07-13 MED ORDER — SUCRALFATE 1 G PO TABS
1.0000 g | ORAL_TABLET | Freq: Three times a day (TID) | ORAL | Status: DC
  Administered 2015-07-13 (×3): 1 g via ORAL
  Filled 2015-07-13 (×4): qty 1

## 2015-07-13 NOTE — Progress Notes (Addendum)
About 0115 nurse called to patient room. Patient very tearful, on phone with wife and saying my "heart hurts" Patient denied any radiating pain. BP and HR stable. Mar[place]e [place] PA notified. No additional orders received. 0.25 xanax given at 0124. Muscle rub cream applied to chest. Upon reassessment patient was asleep but woke up. Patient reports chest hurts again. Patient given hycodan at 25. At approximately 0320, nurse contacted rapid response for advice since patient continues to report some chest pain. Oxycodone '5mg'$  given at 0329. At 0400 vitals taken again, when asked if the pain was better patient states "yes". When asked for a number patient still states "7". When asked if patient was having any pain right now patient states "not right now." Upon next round patient sleeping, no signs of distress.

## 2015-07-13 NOTE — Progress Notes (Signed)
Occupational Therapy Session Note  Patient Details  Name: LEMAR BAKOS MRN: [id number] Date of Birth: 09/20/1940  Today's Date: 07/13/2015 OT Individual Time: 2446-9507 OT Individual Time Calculation (min): 18 min    Short Term Goals: Week 3:  OT Short Term Goal 1 (Week 3): STGs = LTGs based on modified LTGs  Skilled Therapeutic Interventions/Progress Updates:    Make Up Session.   Pt in bed in significant back pain.  Wife present for session as well.  Pt not up to participating in [place] therapy session and nursing in to administer pain meds and other medication.  Educated pt and pt's wife on positioning of right resting hand splint.  Feel fit is better if straps are crossed at the dorsal hand with red strap to gray velcro and gray strap to red velcro.  This tends to keep the palm in the splint better without backing out.  Demonstrated this to the pt's spouse and she was able to voice understanding.  Splint left in place.  [name] also discussing the need for regular sling to place on the RUE for sliding board transfers only.  She states that it is  Difficult to assist him with the transfer and to keep his arm on his lap.  Therapist agreed that using a sling for transfers only may be beneficial.  Discussed with PA and order is placed.  Will try with pt as appropriate.  Pt left in bed with nursing and wife present.   Therapy Documentation Precautions:  Precautions Precautions: Fall Precaution Comments: R hemi, pt on home O2 PTA for recent pneumonia (but used it PRN, not 24/7) Restrictions Weight Bearing Restrictions: No  Pain: Pain Assessment Pain Assessment: Faces Faces Pain Scale: Hurts whole lot Pain Type: Acute pain Pain Location: Back Pain Descriptors / Indicators: Aching;Discomfort;Grimacing Pain Intervention(s): Repositioned;Medication (See eMAR);Emotional support ADL: See Function Navigator for Current Functional Status.   Therapy/Group: Individual  Therapy  Shakai Dolley OTR/L 07/13/2015, 9:39 AM

## 2015-07-13 NOTE — Progress Notes (Signed)
Call received per floor RN at [place] regarding Patient with acute chest pain. Per RN he has complained about this often this admission. D. Angulli P.A contacted prior to myself and updated, no orders recieved. Xanax PO dose given at 0124.  RN advised to given oxycodone 5 mg PRN dose and monitor closely. Patient seen this AM, resting quietly in bed. Per floor RN Patient denies chest pain this morning. AM EKG and troponin completed and EKG results reviewed with  D. Kula at bedside. No additional orders received at this time. RN to monitor Patient and update days RRT RN for worsening changes.

## 2015-07-13 NOTE — Progress Notes (Signed)
[facility] PROGRESS NOTE  KIRKLIN MCDUFFEE SKA:[id number] DOB: [birth year] DOA: 06/22/2015  PCP: Albina Billet, MD  Assessment/Plan: Principal Problem:   ICH (intracerebral hemorrhage) ([place])- L frontal ICH Active Problems:   Mild chronic obstructive pulmonary disease (HCC)   Seizures ([place]), possible   CKD (chronic kidney disease), stage III   Anemia   Right hemiparesis ([place])   Cerebrovascular accident (CVA) due to embolism of left middle cerebral artery (HCC)   Chronic anticoagulation   Malignant neoplasm of left lung (HCC)   Adjustment disorder with mixed anxiety and depressed mood   Mental confusion   Type 2 diabetes mellitus with peripheral neuropathy (HCC)   Confusion   Right flaccid hemiparesis (HCC)   Acute blood loss anemia   Acute confusional state   Palliative care encounter   Ivan Beasley is a 75 y.o. 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 Consultation requested for anemia   Assessment and plan  - Intermittent confusion - Improved significantly. Repeat brain MRI - No new findings. -Volume depletion - Resolved significantly - Back pain - Likely related to PT. Monitor closely. Lipase is negative. On protonix and sucrolfate - Anemia - H/H remains stable. Patient is back to Xarelto. CT abdomen is negative for retroperitoneal bleed.   2. Recent Right hemiplegia secondary to left MCA infarct adjacent to left frontal ICH with adjacent subarachnoid hemorrhage while on Eliquis for Atrial fib.  -  Due to near resolution of ICH as well as has been >2 weeks, and high risk for recurrent embolic stroke off anticoagulation and relative small size of infarct, neurology Dr. Erlinda Hong  recommended on 6/20, to consider transition back to Ivan Beasley, wife okay with Xarelto, this can be started , continue to monitor hemoglobin  MRI with and without contrast 06/26/15 can not confirm or rule out metastasis at this time. As per neurology Still need to repeat MRI with and without contrast after blood all absorbed.  3. Atrial fib. - sounds PAF., Currently normal sinus rhythm EKG  shows normal sinus rhythm  CHADsVASc score at least 6, very high risk for recurrent strokes, will need to weigh risk/benefits of resuming anticoagulation after r/o bleeding risk. Patient is back to Xarelto. 4. htn - stable. Optimize.  5. Dm 2 w/ neuropathy - Optimize. 6. Cad, sp cabg - in light of cardiac hx, will trx for Hb >8 at this time 7. 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 UTI enterobacter cloacae- Sensitive to ceftriaxone. Completed 7 day course of antibiotics. Repeat UA, urine culture and CXR (brief episode of confusion reported last night) 9.  Chronic nonproductive cough-last chest x-ray was on 6/23 with focal increased density in the right lung base, will repeat chest x-ray, doubt pneumonia, continue incentive spirometry, aspiration precautions    DVT prophylaxsis : On Xarelto  Code Status:  Full code       Family Communication: Discussed with wife. Disposition Plan: As per Charlett Blake, MD     Consultants:  [facility]  Procedures:   None  Antibiotics: Anti-infectives    Start     Dose/Rate Route  Frequency Ordered Stop   06/28/15 1000  cefTRIAXone (ROCEPHIN) 1 g in dextrose 5 % 50 mL IVPB  Status:  Discontinued     1 g 100 mL/hr over 30 Minutes Intravenous Every 24 hours 06/28/15 0847 07/05/15 0834   06/27/15 2100  vancomycin  (VANCOCIN) 1,500 mg in sodium chloride 0.9 % 500 mL IVPB  Status:  Discontinued     1,500 mg 250 mL/hr over 120 Minutes Intravenous Every 24 hours 06/26/15 2018 06/28/15 0843      HPI/Subjective: Continues to have a nonproductive cough, otherwise stable, afebrile  Objective: Filed Vitals:   07/13/15 0111 07/13/15 0246 07/13/15 0357 07/13/15 0608  BP: 132/65 130/68 140/55 150/70  Pulse: 88 88 92 97  Temp:    98.4 F (36.9 C)  TempSrc:    Oral  Resp:  18  18  Weight:      SpO2:  98% 94% 90%    Intake/Output Summary (Last 24 hours) at 07/13/15 0840 Last data filed at 07/12/15 1700  Gross per 24 hour  Intake    120 ml  Output      0 ml  Net    120 ml    Exam:  Examination:  General exam: Appears calm and comfortable  Respiratory system: Clear to auscultation. Respiratory effort normal. Cardiovascular system: S1 & S2 heard, RRR. No JVD, murmurs, rubs, gallops or clicks. No pedal edema. Gastrointestinal system: Abdomen is nondistended, soft and nontender. No organomegaly or masses felt. Normal bowel sounds heard. Central nervous system: Alert and oriented. Right upper extremity weakness Extremities: Symmetric 5 x 5 power. Skin: No rashes, lesions or ulcers Psychiatry: Judgement and insight appear normal. Mood & affect appropriate.     Data Reviewed: I have personally reviewed following labs and imaging studies  Micro Results Recent Results (from the past 240 hour(s))  Culture, Urine     Status: Abnormal   Collection Time: 07/09/15  5:05 AM  Result Value Ref Range Status   Specimen Description URINE, CATHETERIZED  Final   Special Requests NONE  Final   Culture <10,000 COLONIES/mL INSIGNIFICANT GROWTH (A)  Final   Report Status 07/10/2015 FINAL  Final    Radiology Reports Ct Abdomen Pelvis Wo Contrast  07/04/2015  CLINICAL DATA:  Anemia, history coronary disease post MI, diabetes mellitus, former smoker, stage III chronic kidney disease, LEFT lung cancer, atrial  fibrillation EXAM: CT ABDOMEN AND PELVIS WITHOUT CONTRAST TECHNIQUE: Multidetector CT imaging of the abdomen and pelvis was performed following the standard protocol without IV contrast. Sagittal and coronal MPR images reconstructed from axial data set. No oral contrast was administered. COMPARISON:  None. CT from PET-CT exam 05/03/2015 FINDINGS: Lower chest:  Bibasilar atelectasis. Hepatobiliary: Cholelithiasis. Beam hardening artifacts from patient's arms traverse liver, no gross hepatic abnormality seen. Pancreas: Atrophic, otherwise unremarkable Spleen: Traversing artifacts, grossly normal appearance Adrenals/Urinary Tract: Normal appearing adrenal glands. Cyst at inferior pole LEFT kidney again identified 3.4 x 3.6 cm image 49. Kidneys otherwise unremarkable without additional mass or hydronephrosis. Single nondilated ureters bilaterally. Normal appearing well distended urinary bladder. Stomach/Bowel: Normal appendix. Retained dense contrast within colon from recent swallowing study. Stomach and bowel loops otherwise normal appearance for technique. Vascular/Lymphatic: Atherosclerotic calcifications aorta, iliac, and femoral arteries. Few coronary arterial calcifications. Low-attenuation of circulating blood consistent with history of anemia. No adenopathy. Reproductive: N/A Other: Small BILATERAL inguinal hernias containing fat. No free air or free fluid. Musculoskeletal: Degenerative disc disease changes L4-L5. No acute osseous findings. IMPRESSION: LEFT renal cyst. Cholelithiasis. Diffuse  colonic diverticulosis without gross evidence acute diverticulitis. Small BILATERAL inguinal hernias containing fat. Aortic atherosclerosis. Electronically Signed   By: Lavonia Dana M.D.   On: 07/04/2015 12:55   Dg Chest 1 View  07/08/2015  CLINICAL DATA:  Altered mental status for 1 day. History of lung carcinoma EXAM: CHEST 1 VIEW COMPARISON:  Chest radiograph July 06, 2015; PET-CT May 03, 2015 FINDINGS: There is  again noted opacity in the left mid lung region extending to the left hilum, likely due to mass with postobstructive pneumonitis. Right lung clear. Heart is upper normal in size with pulmonary vascularity within normal limits. Calcification noted in the aortic arch. Port-A-Cath tip in superior vena cava. No pneumothorax. No bone lesions evident. IMPRESSION: Stable left midlung opacity, likely due to mass with postobstructive pneumonitis. Lungs elsewhere clear. No new opacity. Stable cardiac silhouette. Aortic atherosclerosis. Electronically Signed   By: Lowella Grip III M.D.   On: 07/08/2015 19:57   Dg Chest 2 View  07/02/2015  CLINICAL DATA:  Persistent cough, COPD, lung malignancy. Patient cyst has sustained an intracranial hemorrhage with right hemi [place] is EXAM: CHEST  2 VIEW COMPARISON:  Chest x-ray of June 26, 2015 FINDINGS: The right lung is adequately inflated and exhibits increased density in the infrahilar region. There is some overlap here of the Port-A-Cath reservoir. On the left there is persistent increased density in the perihilar region extending toward the lateral pleural surface. The heart is top-normal in size. There are post CABG changes. Three upper sternal wires are chronically broken. There is calcification in the wall of the aortic arch. There is no pleural effusion or pneumothorax. The Port-A-Cath appliance tip projects over the midportion of the SVC. IMPRESSION: 1. Subtle increased density at the right lung base suggests subsegmental atelectasis. Stable parenchymal density in the left mid lung and left perihilar region. 2. Stable post CABG changes.  No evidence of CHF. Electronically Signed   By: David  Martinique M.D.   On: 07/02/2015 09:40   Dg Chest 2 View  06/26/2015  CLINICAL DATA:  C/o chronic cough. 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 Rales heard in right lower lung. 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 EXAM: CHEST  2 VIEW  COMPARISON:  06/25/2015 FINDINGS: There stable changes from prior CABG surgery. Cardiac silhouette is mildly enlarged. Focal irregular opacity in the posterior perihilar region of the left lung is stable. There are no new areas of lung opacification. No pleural effusion or pneumothorax. Right anterior chest wall Port-A-Cath is stable. Bony thorax is intact. IMPRESSION: 1. No acute cardiopulmonary disease. 2. No change from the prior study. Persistent focal opacity in the perihilar region of the left lung consistent with the reported squamous cell lung carcinoma. Electronically Signed   By: Lajean Manes M.D.   On: 06/26/2015 16:36   Dg Chest 2 View  06/25/2015  CLINICAL DATA:  Cough, left-sided weakness EXAM: CHEST  2 VIEW COMPARISON:  07/06/2015 FINDINGS: Cardiomediastinal silhouette is stable. Right IJ Port-A-Cath is unchanged in position. Persistent left upper lobe consolidation. Bony thorax is unremarkable. IMPRESSION: No significant change. Persistent left upper lobe consolidation. Stable right IJ Port-A-Cath position. Electronically Signed   By: Lahoma Crocker M.D.   On: 06/25/2015 16:54   Ct Head Wo Contrast  06/26/2015  CLINICAL DATA:  Slurred speech. EXAM: CT HEAD WITHOUT CONTRAST TECHNIQUE: Contiguous axial images were obtained from the base of the skull through the vertex without intravenous contrast. COMPARISON:  MRI  of June 14, 2015.  CT scan of June 13, 2015. FINDINGS: Bony calvarium appears intact.  Sphenoid sinusitis is noted. Mild diffuse cortical atrophy is noted. There is continued presence of left superior parietal intraparenchymal hemorrhage which now measures 21 x 11 mm and is slightly decreased in size compared to prior exam. Subarachnoid hemorrhage has resolved. There is continued surrounding vasogenic edema. No significant midline shift is noted. Ventricular size is unremarkable. IMPRESSION: Mild diffuse cortical atrophy. Continued presence of left parietal intraparenchymal hemorrhage with  surrounding vasogenic edema. This is improved compared to prior exam. Subarachnoid hemorrhage is no longer present. Electronically Signed   By: Marijo Conception, M.D.   On: 06/26/2015 14:29   Ct Head Wo Contrast  06/13/2015  CLINICAL DATA:  The woke at 5 a.m. with right-sided weakness. EXAM: CT HEAD WITHOUT CONTRAST TECHNIQUE: Contiguous axial images were obtained from the base of the skull through the vertex without intravenous contrast. COMPARISON:  None. FINDINGS: There is intra cerebral hemorrhage noted in the posterior left frontal lobe. Subarachnoid extension. Hemorrhage measures 2.4 x 2.2 x 1.6 cm for a volume of 4.2 mL. Mild surrounding vasogenic edema. No significant mass effect. No midline shift. No hydrocephalus. No acute calvarial abnormality. Air-fluid level in the left sphenoid sinus. Mastoid air cells are clear. IMPRESSION: Left posterior frontal intra cerebral hemorrhage with subarachnoid extension as described above. Critical Value/emergent results were called by telephone at the time of interpretation on 06/13/2015 at 10:02 am to Dr. Loura Pardon , who verbally acknowledged these results. Electronically Signed   By: Rolm Baptise M.D.   On: 06/13/2015 10:04   Mr Jodene Nam Head Wo Contrast  06/15/2015  ADDENDUM REPORT: 06/15/2015 08:11 ADDENDUM: With the rounded contour of the enhancing component of the left frontal lobe hematoma, follow-up until clearance recommended to exclude underlying vascular abnormality. Electronically Signed   By: Genia Del M.D.   On: 06/15/2015 08:11  06/15/2015  CLINICAL DATA:  75 year old diabetic male with history of lung cancer. Right-sided weakness. Subsequent encounter. EXAM: MRI HEAD WITHOUT CONTRAST MRA HEAD WITHOUT CONTRAST TECHNIQUE: Multiplanar, multiecho pulse sequences of the brain and surrounding structures were obtained without intravenous contrast. Angiographic images of the head were obtained using MRA technique without contrast. COMPARISON:  06/13/2015 head  CT.  07/28/2014 brain MR. FINDINGS: MRI HEAD FINDINGS Posterior left frontal lobe complex 2.5 x 2.2 x 2 cm hemorrhagic lesion with marked surrounding vasogenic edema with breakthrough into sulci with subarachnoid hemorrhage noted. Patient's history of lung cancer in addition to 6 mm rounded area of enhanced along the posterior margin of this hemorrhage suggests that this is most likely is related to a hemorrhagic metastatic lesion which has bled. Continued MR surveillance as hemorrhage clears is recommended. There may be a a second enhancing lesion within the anterior left parietal lobe with surrounding vasogenic edema. Venous infarct can have a similar appearance. The major dural sinuses appear patent. No acute thrombotic infarct separate from the above described findings. Remote small left parietal lobe, left frontal lobe, posterior left opercular and tiny right thalamic infarct. Mild small vessel disease changes. Moderate global atrophy without hydrocephalus. Opacification left sphenoid sinus with air-fluid level suggesting acute sinusitis. Mild mucosal thickening ethmoid sinus air cells and minimal mucosal thickening frontal sinuses. Bilateral mastoid air cell and middle ear opacification greater on the left without obstructing lesion of the eustachian tube noted. Post lens replacement without acute orbital abnormality noted. MRA HEAD FINDINGS MR angiogram does not incorporate the left frontal lobe hemorrhage.  Mild narrowing supraclinoid aspect internal carotid artery bilaterally with irregularity more notable on the left. Small infundibulum on the left posterior communicating artery level. Anterior circulation without medium or large size vessel significant stenosis or occlusion. Middle cerebral artery branch vessel irregularity bilaterally. Right vertebral artery ends in a posterior inferior cerebellar artery distribution. No significant narrowing left vertebral artery. Moderate narrowing portions of the left  posterior inferior cerebellar artery. Ectatic basilar artery without high-grade stenosis. Nonvisualized anterior inferior cerebellar arteries. Small left superior cerebellar artery. Posterior cerebral artery distal branch vessel narrowing bilaterally. IMPRESSION: MRI HEAD Posterior left frontal lobe complex 2.5 x 2.2 x 2 cm hemorrhagic lesion with marked surrounding vasogenic edema with breakthrough into sulci with subarachnoid hemorrhage noted. Patient's history of lung cancer in addition to 6 mm rounded area of enhanced along the posterior margin of this hemorrhage suggests that this is most likely is related to a hemorrhagic metastatic lesion which has bled. Continued MR surveillance as hemorrhage clears is recommended. There may be a a second enhancing lesion within the anterior left parietal lobe with surrounding vasogenic edema. Venous infarct can have a similar appearance. The major dural sinuses appear patent. Opacification left sphenoid sinus with air-fluid level suggesting acute sinusitis. Bilateral mastoid air cell and middle ear opacification greater on the left without obstructing lesion of the eustachian tube noted. MRA HEAD MR angiogram does not incorporate the left frontal lobe hemorrhage. Mild narrowing supraclinoid aspect internal carotid artery bilaterally with irregularity more notable on the left. Small infundibulum on the left posterior communicating artery level. Anterior circulation without medium or large size vessel significant stenosis or occlusion. Middle cerebral artery branch vessel irregularity bilaterally. Right vertebral artery ends in a posterior inferior cerebellar artery distribution. No significant narrowing left vertebral artery. Moderate narrowing portions of the left posterior inferior cerebellar artery. Ectatic basilar artery without high-grade stenosis. Nonvisualized anterior inferior cerebellar arteries. Small left superior cerebellar artery. Posterior cerebral artery  distal branch vessel narrowing bilaterally. Electronically Signed: By: Genia Del M.D. On: 06/14/2015 19:34   [name] IW Contrast  07/12/2015  CLINICAL DATA:  Follow-up possible brain metastasis. EXAM: MRI HEAD WITHOUT AND WITH CONTRAST TECHNIQUE: Multiplanar, multiecho pulse sequences of the brain and surrounding structures were obtained without and with intravenous contrast. CONTRAST:  38m MULTIHANCE GADOBENATE DIMEGLUMINE 529 MG/ML IV SOLN COMPARISON:  Brain MRI 06/14/2015 and 06/26/2015 FINDINGS: Calvarium and upper cervical spine: No focal marrow signal abnormality. Orbits: Bilateral cataract resection. Sinuses and Mastoids: Persistent bilateral mastoid effusion. Sphenoid sinusitis has improved. Brain: Parenchymal hemorrhage subcortical posterior left frontal has contracted. There is progressive hemosiderin staining with rim of methemoglobin along the cavity wall. On 5:20 the hematoma measures 23 x 14 mm today. Small regional subarachnoid hemorrhage is diminished. Persistent restricted diffusion subcortical below the hematoma cavity. Cortical restricted diffusion has diminished from prior. Even when accounting for methemoglobin rim, there is enhancement around the hematoma cavity, expected, which obscures the nodular enhancement seen on the initial scan. There is new cortical enhancement along the previously seen acute infarct. A nodular area of frontal parietal cortex enhancement is stable in size compared to initial scan 06/14/2015, 10 mm in diameter on 11:43. Separate from this area, there is no evidence of new enhancement. Postcontrast and other sequences are limited by motion, which could obscure subtle lesion. Stable lateral ventriculomegaly with prominent flow void and cerebral aqua duct. Still, communicating hydrocephalus is considered unlikely given the degree of generalized volume loss the lack third ventricular dilatation. No interval infarct.  No evidence of major vessel occlusion.  IMPRESSION: 1. Progressive posterior left frontal enhancement related to infarct seen 06/26/2015. Persistent rim enhancement around the contracting posterior left frontal parenchymal hematoma which obscures the nodular enhancement seen on initial scan 06/14/2015. 10 mm cortical enhancement posterior to the hematoma is stable since initial scan. This expected subacute infarct enhancement and hematoma cavity enhancement could obscure metastatic enhancement. Given the evolution of findings, it is possible the enhancement on the initial scan was also subacute infarct, with superimposed hemorrhagic conversion. Continued imaging follow-up is recommended. Post infarct enhancement can persist for up to 3 months, but should be diminishing by 6 weeks. 2. Motion degraded exam without enhancement other than above. 3. Bilateral mastoiditis.  Improved sinusitis. Electronically Signed   By: Monte Fantasia M.D.   On: 07/12/2015 14:54   [name] HE Contrast  06/26/2015  CLINICAL DATA:  Initial evaluation for progressively garbled and slurred speech, with increased right-sided facial droop. EXAM: MRI HEAD WITHOUT AND WITH CONTRAST TECHNIQUE: Multiplanar, multiecho pulse sequences of the brain and surrounding structures were obtained without and with intravenous contrast. CONTRAST:  9m MULTIHANCE GADOBENATE DIMEGLUMINE 529 MG/ML IV SOLN COMPARISON:  Prior CT from earlier the same day as well as previous MRI from 06/14/2015. FINDINGS: Generalized cerebral atrophy with minimal chronic small vessel ischemic changes again noted, stable. Small remote lacunar infarct within the right thalamus. Previously identified posterior left frontal lobe complex hemorrhagic lesion again seen, overall likely decreased in size from previous. Lesion measures 13 x 21 x 16 mm on T2 weighted sequences. Surrounding vasogenic edema is similar. Breakthrough subarachnoid hemorrhage within the adjacent cortical sulci again seen, improved. There is  increased peripheral enhancement about the margin of the hemorrhagic lesion, likely related to the evolving hematoma. Previously noted small hemorrhagic nodule is not as clearly delineated on this exam. Note again made of a second possible enhancing lesion within the anterior left parietal lobe along the dura, measuring approximately 9 x 11 x 11 mm. Surrounding vasogenic edema. No other new enhancing lesions. Immediately lateral and inferior to the dominant hemorrhagic lesion. There is a focus of abnormal restricted diffusion involving the cortical gray matter and underlying white matter of the posterior left frontal region, compatible with acute ischemic infarct (series 4, image 35). Associated signal loss on ADC map. This appears to be more vascular distribution rather than seizure related. Additional small sub cm focus of restricted diffusion more posteriorly within the left centrum semi ovale. No significant mass effect. Area of infarction closely approximates the hemorrhagic lesion can subarachnoid hemorrhage, but does not appear to be hemorrhagic in of itself. No other acute infarct. Gray-white matter differentiation otherwise maintained. Major intracranial vascular flow voids are preserved. No midline shift. No hydrocephalus. No extra-axial fluid collection. Again, major dural sinuses appear to be grossly patent. Craniocervical junction within normal limits. Visualized upper cervical spine unremarkable. Pituitary gland within normal limits. No acute abnormality about the globes and orbits. Patient is status post lens extraction bilaterally. Mucosal thickening with fluid level within the left sphenoid sinus, similar to prior. Mild scattered opacity within the anterior ethmoidal air cells. Bilateral mastoid effusions present. Visualize bone marrow within normal limits. Scalp soft tissues demonstrate no acute abnormality. IMPRESSION: 1. Acute ischemic left MCA territory infarct adjacent to the posterior left  frontal hemorrhagic lesion. Vasospasm as an underlying etiology is suspected given the adjacent complex hemorrhagic lesion and subarachnoid hemorrhage. 2. Continued interval evolution of complex left frontal lobe hemorrhagic lesion with similar vasogenic edema.  Associated breakthrough subarachnoid hemorrhage is improved. This lesion now demonstrates peripheral enhancement, which would be expected with an evolving hematoma. Previously noted small nodular focus of enhancement not well delineated on this exam. Again, continued MR surveillance at the hemorrhage clears is recommended to ensure no underlying lesion is present. 3. Additional 9 x 11 x 11 mm enhancing lesion within the anterior left parietal lobe, similar to prior. Attention at follow-up recommended. 4. Acute left sphenoid sinus disease with bilateral mastoid effusions, similar to prior. Electronically Signed   By: Jeannine Boga M.D.   On: 06/26/2015 23:30   [name] EY Contrast  06/15/2015  ADDENDUM REPORT: 06/15/2015 08:11 ADDENDUM: With the rounded contour of the enhancing component of the left frontal lobe hematoma, follow-up until clearance recommended to exclude underlying vascular abnormality. Electronically Signed   By: Genia Del M.D.   On: 06/15/2015 08:11  06/15/2015  CLINICAL DATA:  75 year old diabetic male with history of lung cancer. Right-sided weakness. Subsequent encounter. EXAM: MRI HEAD WITHOUT CONTRAST MRA HEAD WITHOUT CONTRAST TECHNIQUE: Multiplanar, multiecho pulse sequences of the brain and surrounding structures were obtained without intravenous contrast. Angiographic images of the head were obtained using MRA technique without contrast. COMPARISON:  06/13/2015 head CT.  07/28/2014 brain MR. FINDINGS: MRI HEAD FINDINGS Posterior left frontal lobe complex 2.5 x 2.2 x 2 cm hemorrhagic lesion with marked surrounding vasogenic edema with breakthrough into sulci with subarachnoid hemorrhage noted. Patient's history of lung  cancer in addition to 6 mm rounded area of enhanced along the posterior margin of this hemorrhage suggests that this is most likely is related to a hemorrhagic metastatic lesion which has bled. Continued MR surveillance as hemorrhage clears is recommended. There may be a a second enhancing lesion within the anterior left parietal lobe with surrounding vasogenic edema. Venous infarct can have a similar appearance. The major dural sinuses appear patent. No acute thrombotic infarct separate from the above described findings. Remote small left parietal lobe, left frontal lobe, posterior left opercular and tiny right thalamic infarct. Mild small vessel disease changes. Moderate global atrophy without hydrocephalus. Opacification left sphenoid sinus with air-fluid level suggesting acute sinusitis. Mild mucosal thickening ethmoid sinus air cells and minimal mucosal thickening frontal sinuses. Bilateral mastoid air cell and middle ear opacification greater on the left without obstructing lesion of the eustachian tube noted. Post lens replacement without acute orbital abnormality noted. MRA HEAD FINDINGS MR angiogram does not incorporate the left frontal lobe hemorrhage. Mild narrowing supraclinoid aspect internal carotid artery bilaterally with irregularity more notable on the left. Small infundibulum on the left posterior communicating artery level. Anterior circulation without medium or large size vessel significant stenosis or occlusion. Middle cerebral artery branch vessel irregularity bilaterally. Right vertebral artery ends in a posterior inferior cerebellar artery distribution. No significant narrowing left vertebral artery. Moderate narrowing portions of the left posterior inferior cerebellar artery. Ectatic basilar artery without high-grade stenosis. Nonvisualized anterior inferior cerebellar arteries. Small left superior cerebellar artery. Posterior cerebral artery distal branch vessel narrowing bilaterally.  IMPRESSION: MRI HEAD Posterior left frontal lobe complex 2.5 x 2.2 x 2 cm hemorrhagic lesion with marked surrounding vasogenic edema with breakthrough into sulci with subarachnoid hemorrhage noted. Patient's history of lung cancer in addition to 6 mm rounded area of enhanced along the posterior margin of this hemorrhage suggests that this is most likely is related to a hemorrhagic metastatic lesion which has bled. Continued MR surveillance as hemorrhage clears is recommended. There may be a a second enhancing lesion  within the anterior left parietal lobe with surrounding vasogenic edema. Venous infarct can have a similar appearance. The major dural sinuses appear patent. Opacification left sphenoid sinus with air-fluid level suggesting acute sinusitis. Bilateral mastoid air cell and middle ear opacification greater on the left without obstructing lesion of the eustachian tube noted. MRA HEAD MR angiogram does not incorporate the left frontal lobe hemorrhage. Mild narrowing supraclinoid aspect internal carotid artery bilaterally with irregularity more notable on the left. Small infundibulum on the left posterior communicating artery level. Anterior circulation without medium or large size vessel significant stenosis or occlusion. Middle cerebral artery branch vessel irregularity bilaterally. Right vertebral artery ends in a posterior inferior cerebellar artery distribution. No significant narrowing left vertebral artery. Moderate narrowing portions of the left posterior inferior cerebellar artery. Ectatic basilar artery without high-grade stenosis. Nonvisualized anterior inferior cerebellar arteries. Small left superior cerebellar artery. Posterior cerebral artery distal branch vessel narrowing bilaterally. Electronically Signed: By: Genia Del M.D. On: 06/14/2015 19:34   Dg Chest Port 1 View  07/06/2015  CLINICAL DATA:  COUGH.HX MI,CA EXAM: PORTABLE CHEST 1 VIEW COMPARISON:  07/02/2015 FINDINGS: Heart size is  enlarged. Right-sided Port-A-Cath tip to the level of the lower superior vena cava. There is patchy density in the left upper lobe, stable compared with prior study. Mild interstitial edema is noted. IMPRESSION: 1. Persistent left upper lobe density. 2. Mild interstitial edema. Electronically Signed   By: Nolon Nations M.D.   On: 07/06/2015 14:09   Dg Chest Port 1 View  06/20/2015  CLINICAL DATA:  Cough and congestion. EXAM: PORTABLE CHEST 1 VIEW COMPARISON:  June 18, 2015 FINDINGS: There is persistent airspace consolidation in the left upper lobe with volume loss. Lungs elsewhere clear. Heart is upper normal in size with pulmonary vascularity within normal limits. Port-A-Cath tip is in the superior vena cava near the cavoatrial junction, stable. No pneumothorax. No adenopathy evident. Patient is status post coronary artery bypass grafting. IMPRESSION: Persistent left upper lobe airspace consolidation with volume loss. No change from 3 days prior. Stable cardiac silhouette. Electronically Signed   By: Lowella Grip III M.D.   On: 07/07/2015 07:25   Dg Chest Port 1 View  06/18/2015  CLINICAL DATA:  Cough EXAM: PORTABLE CHEST 1 VIEW COMPARISON:  06/16/2015 FINDINGS: Stable right jugular Port-A-Cath. Low volumes with bibasilar atelectasis. Left mid lung dense pulmonary opacity is stable. No pneumothorax. IMPRESSION: Stable left mid lung dense pulmonary opacity. Bibasilar atelectasis. Electronically Signed   By: Marybelle Killings M.D.   On: 06/18/2015 15:16   Dg Chest Port 1 View  06/16/2015  CLINICAL DATA:  75 year old male with lung cancer. No chest complaint at this time. EXAM: PORTABLE CHEST 1 VIEW COMPARISON:  Chest radiograph dated 06/15/2015 FINDINGS: There has been interval removal of the endotracheal and enteric tube. The stable area of density is again noted in the left mid lung field. Multiple surgical clips arms in this area. No new consolidation identified. Overall there is improved aeration of the  left lower lung field since the prior study. The right lung is clear. There is no pleural effusion or pneumothorax. Stable cardiac silhouette. Median sternotomy wires and CABG vascular clips noted. Right pectoral Port-A-Cath with tip in stable positioning. No acute osseous pathology. IMPRESSION: Interval removal of the endotracheal and enteric tube. Stable appearing density in the left mid lung field with overall improvement of the aeration of the left lung. Electronically Signed   By: Anner Crete M.D.   On: 06/16/2015 19:54  Dg Chest Port 1 View  06/15/2015  CLINICAL DATA:  Acute respiratory failure EXAM: PORTABLE CHEST 1 VIEW COMPARISON:  Portable chest x-ray of 06/14/2015 FINDINGS: Aeration of the lungs has improved somewhat. The tip of the endotracheal tube appears to be approximately 3.8 cm above the carina. Opacity in the left perihilar and left mid lung is unchanged. Cardiomegaly is stable. Right sided Port-A-Cath is unchanged in position, and right IJ central venous line is unchanged. IMPRESSION: Slightly better aeration. Endotracheal tube tip 3.8 cm above the carina. Electronically Signed   By: Ivar Drape M.D.   On: 06/15/2015 08:05   Dg Chest Port 1 View  06/14/2015  CLINICAL DATA:  Respiratory failure, history of coronary artery disease, GERD, diabetes, lung cancer EXAM: PORTABLE CHEST 1 VIEW COMPARISON:  06/13/2015 FINDINGS: Cardiomediastinal silhouette is stable. Status post median sternotomy. Persistent opacity left hilum and left perihilar region. Post radiation fibrosis left upper lobe again noted. Left basilar atelectasis. Stable endotracheal and NG tube position. Right IJ Port-A-Cath is unchanged in position. Stable right IJ central line. No pneumothorax. No pulmonary edema. IMPRESSION: Stable support apparatus. Persistent opacity left hilum and left perihilar region. Postradiation fibrosis in left upper lobe again noted. Left basilar atelectasis. No pulmonary edema. No pneumothorax.  Status post median sternotomy. Electronically Signed   By: Lahoma Crocker M.D.   On: 06/14/2015 08:15   Dg Chest Port 1 View  06/13/2015  CLINICAL DATA:  75 year old male with endotracheal tube placement. EXAM: PORTABLE CHEST 1 VIEW COMPARISON:  Radiograph dated 06/13/2015 FINDINGS: Endotracheal tube with tip approximately 4 cm above the carina. An enteric tube is partially visualized coursing the left hemi abdomen with tip beyond the inferior margin of the image. There is a right IJ central line with tip over the mediastinum. Right pectoral infusion catheter is also noted. There is persistent opacity in the left mid lung field. Multiple surgical clips in the left lower lung field similar to prior study. There is no pneumothorax. The right costophrenic angle has been excluded from the image. The cardiac border is silhouetted. No acute osseous pathology identified. IMPRESSION: Endotracheal tube above the carina. Stable appearing postsurgical changes and persistent opacity in the left mid lung field. Electronically Signed   By: Anner Crete M.D.   On: 06/13/2015 21:05   Dg Chest Port 1 View  06/13/2015  CLINICAL DATA:  Respiratory failure and cough. History of left lung squamous carcinoma. EXAM: PORTABLE CHEST 1 VIEW COMPARISON:  05/27/2015 and prior radiographs.  05/03/2015 PET CT FINDINGS: Cardiomegaly, CABG changes and right Port-A-Cath with tip overlying the lower SVC again noted. Left mid lung opacity is stable to slightly increased in size There is no evidence of pneumothorax or pleural effusion. No other changes identified. IMPRESSION: Stable to slightly increased left lung opacity which may represent posttreatment changes. No other significant change. Electronically Signed   By: Margarette Canada M.D.   On: 06/13/2015 18:10   Dg Abd Portable 1v  06/13/2015  CLINICAL DATA:  Orogastric tube placement EXAM: PORTABLE ABDOMEN - 1 VIEW COMPARISON:  PET-CT May 03, 2015 FINDINGS: Orogastric tube tip and side port  are in the stomach. Bowel gas pattern is unremarkable. No bowel dilatation or air-fluid level suggesting obstruction. No free air. Moderate stool is noted in the colon. IMPRESSION: Orogastric tube tip and side port in stomach. Bowel gas pattern unremarkable. No free air evident. Electronically Signed   By: Lowella Grip III M.D.   On: 06/13/2015 20:05   Dg Swallowing Func-speech  Pathology  06/29/2015  Objective Swallowing Evaluation: Type of Study: MBS-Modified Barium Swallow Study Patient Details Name: Ivan Beasley MRN: [id number] Date of Birth: 16-Aug-1940 Today's Date: 06/29/2015 Time: SLP Start Time (ACUTE ONLY): 0910-SLP Stop Time (ACUTE ONLY): 0930 SLP Time Calculation (min) (ACUTE ONLY): 20 min Past Medical History: Past Medical History Diagnosis Date . Coronary artery disease  . Pneumonia 2000 . Arthritis  . 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 Bruises easily  . GERD (gastroesophageal reflux disease)    takes Omeprazole daily as needed for stomach pain . Blood transfusion 2001 . Diabetes mellitus    takes Metformin daily; . Impaired hearing    bil hearing aide . Macular degeneration    being watched for this but hasn't been "completely" diagnosed . Myocardial infarction ([place]) 2001 . Cancer [place])  Past Surgical History: Past Surgical History Procedure Laterality Date . Coronary artery bypass graft  2001   4 vessels . Cardiac catheterization  2001 . Tonsillectomy     as a child . Colonoscopy   . Cataract extraction     bilateral . Lumbar laminectomy/decompression microdiscectomy  12/19/2010   Procedure: LUMBAR LAMINECTOMY/DECOMPRESSION MICRODISCECTOMY;  Surgeon: Elaina Hoops;  Location: [facility];  Service: Neurosurgery;  Laterality: N/A;  Lumbar three-four, four-five decompressive lumbar laminectomy . Joint replacement  right tkr . Eye surgery     cataracts . Back surgery     l4 5 . Portacath placement N/A 05/20/2014   Procedure: INSERTION PORT-A-CATH;  Surgeon: Nestor Lewandowsky, MD;  Location:  [facility] ORS;  Service: General;  Laterality: N/A; HPI: 75 y.o. male patient with PMH: GERD, pna, CAD, DM, MI, cancer and chronic admitted with right-sided hemi-plegia. CT of the head revealed left frontal intracerebral hemorrhage, with subarachnoid hemorrhage as well adjacent to it. Intubated approximately 24-30 hours (extubated 6/6). 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  Objective swallow study ordered to determine safest diet consistencies due to change in function.  No Data Recorded Assessment / Plan / Recommendation CHL IP CLINICAL IMPRESSIONS 06/29/2015 Therapy Diagnosis Moderate oral dysphagia, Moderate pharyngeal dysphagia  Clinical Impression Pt presents with a moderate oropharyngeal dysphagia with both sensory and motor components.  As mentioned on bedside swallow evaluation, pt presents with right sided labial, lingual, and buccal weakness which impacts his ability to contain and transit boluses effectively.  This in combination with decreased pharyngeal sensation result in premature spillage of materials into the pharynx with swallow response most consistently triggered at the pyriforms.  Delay in swallow response resulted in aspiration of thin liquids with delayed sensation.  Congested cough which occurred several seconds after aspiration event was ineffective for clearing aspirates from the airway.  Deep penetration was noted x1 over multiple boluses of nectar thick liquids which pt was able to sense and clear from the airway before materials passed below the vocal cords.  Poor base of tongue strength and decreased hyolaryngeal excursion also contributed to moderate pharyngeal residuals post swallow (vallecular residue > pyriforms).  Residue cleared to minimal-mild with cues for extra swallows. Recommend that pt initiate a Dys 1 diet with nectar thick liquids and full supervision for use of swallowing precautions.   Prognosis for advancement good  with SLP intervention for pharyngeal strengthening exercises and management of safe diet progression.   Impact on safety and function Moderate aspiration risk     Prognosis 06/29/2015 Prognosis for Safe Diet Advancement Good Barriers to  Reach Goals -- Barriers/Prognosis Comment -- CHL IP DIET RECOMMENDATION 06/29/2015 SLP Diet Recommendations Dysphagia 1 (Puree) solids;Nectar thick liquid Liquid Administration via Cup Medication Administration Crushed with puree Compensations Slow rate;Small sips/bites;Clear throat intermittently;Multiple dry swallows after each bite/sip Postural Changes --   No flowsheet data found.         CHL IP ORAL PHASE 06/29/2015 Oral Phase Impaired Oral - Pudding Teaspoon -- Oral - Pudding Cup -- Oral - Honey Teaspoon -- Oral - Honey Cup -- Oral - Nectar Teaspoon Weak lingual manipulation;Right anterior bolus loss;Reduced posterior propulsion;Lingual/palatal residue;Delayed oral transit;Decreased bolus cohesion;Premature spillage Oral - Nectar Cup Right anterior bolus loss;Weak lingual manipulation;Lingual/palatal residue;Delayed oral transit;Decreased bolus cohesion;Premature spillage;Reduced posterior propulsion Oral - Nectar Straw Weak lingual manipulation;Lingual/palatal residue;Decreased bolus cohesion;Premature spillage Oral - Thin Teaspoon Right anterior bolus loss;Weak lingual manipulation;Reduced posterior propulsion;Lingual/palatal residue;Premature spillage;Delayed oral transit;Decreased bolus cohesion Oral - Thin Cup Weak lingual manipulation;Premature spillage;Lingual/palatal residue;Reduced posterior propulsion;Right anterior bolus loss;Delayed oral transit;Decreased bolus cohesion Oral - Thin Straw -- Oral - Puree Right anterior bolus loss;Weak lingual manipulation;Reduced posterior propulsion;Lingual/palatal residue;Delayed oral transit;Decreased bolus cohesion;Premature spillage Oral - Mech Soft -- Oral - Regular -- Oral - Multi-Consistency -- Oral - Pill -- Oral Phase -  Comment --  CHL IP PHARYNGEAL PHASE 06/29/2015 Pharyngeal Phase Impaired Pharyngeal- Pudding Teaspoon -- Pharyngeal -- Pharyngeal- Pudding Cup -- Pharyngeal -- Pharyngeal- Honey Teaspoon -- Pharyngeal -- Pharyngeal- Honey Cup -- Pharyngeal -- Pharyngeal- Nectar Teaspoon Delayed swallow initiation-vallecula;Reduced anterior laryngeal mobility;Reduced laryngeal elevation;Reduced tongue base retraction;Reduced airway/laryngeal closure;Pharyngeal residue - posterior pharnyx;Pharyngeal residue - valleculae Pharyngeal -- Pharyngeal- Nectar Cup Delayed swallow initiation-pyriform sinuses;Reduced anterior laryngeal mobility;Reduced laryngeal elevation;Reduced airway/laryngeal closure;Reduced tongue base retraction;Pharyngeal residue - valleculae;Pharyngeal residue - posterior pharnyx;Penetration/Aspiration during swallow Pharyngeal Material enters airway, CONTACTS cords and then ejected out Pharyngeal- Nectar Straw Reduced anterior laryngeal mobility;Reduced laryngeal elevation;Reduced airway/laryngeal closure;Reduced tongue base retraction;Pharyngeal residue - valleculae;Pharyngeal residue - posterior pharnyx;Pharyngeal residue - pyriform Pharyngeal -- Pharyngeal- Thin Teaspoon Delayed swallow initiation-vallecula;Reduced anterior laryngeal mobility;Reduced laryngeal elevation;Reduced airway/laryngeal closure;Reduced tongue base retraction;Pharyngeal residue - valleculae;Pharyngeal residue - pyriform;Penetration/Aspiration during swallow Pharyngeal Material enters airway, CONTACTS cords and then ejected out Pharyngeal- Thin Cup Delayed swallow initiation-pyriform sinuses;Reduced anterior laryngeal mobility;Reduced laryngeal elevation;Reduced airway/laryngeal closure;Reduced tongue base retraction;Penetration/Aspiration during swallow;Pharyngeal residue - valleculae;Pharyngeal residue - pyriform Pharyngeal Material enters airway, passes BELOW cords without attempt by patient to eject out (silent aspiration) Pharyngeal- Thin  Straw -- Pharyngeal -- Pharyngeal- Puree Delayed swallow initiation-vallecula;Reduced anterior laryngeal mobility;Reduced laryngeal elevation;Reduced airway/laryngeal closure;Reduced tongue base retraction;Pharyngeal residue - valleculae;Pharyngeal residue - pyriform Pharyngeal -- Pharyngeal- Mechanical Soft -- Pharyngeal -- Pharyngeal- Regular -- Pharyngeal -- Pharyngeal- Multi-consistency -- Pharyngeal -- Pharyngeal- Pill -- Pharyngeal -- Pharyngeal Comment --  No flowsheet data found. No flowsheet data found. Page, [name] 06/29/2015, 3:21 PM                CBC  Recent Labs Lab 07/07/15 0530 07/08/15 0430 07/09/15 0500 07/11/15 0600  WBC 5.6  --  6.3 3.9*  HGB 8.8* 8.9* 9.1* 8.2*  HCT 28.5* 28.7* 29.4* 26.7*  PLT 183  --  216 208  MCV 85.3  --  88.0 89.0  MCH 26.3  --  27.2 27.3  MCHC 30.9  --  31.0 30.7  RDW 17.4*  --  17.3* 16.7*  LYMPHSABS  --   --  0.9 0.6*  MONOABS  --   --  1.0 0.6  EOSABS  --   --  0.2 0.4  BASOSABS  --   --  0.0 0.0    Chemistries   Recent Labs Lab 07/07/15 1430 07/09/15 0500 07/11/15 0600  NA 136 135 138  K 5.1 4.5 4.3  CL 105 103 105  CO2 '22 22 24  '$ GLUCOSE 92 99 110*  BUN '10 12 13  '$ CREATININE 1.51* 1.36* 1.23  CALCIUM 9.8 9.6 9.2  AST 20  --   --   ALT 16*  --   --   ALKPHOS 102  --   --   BILITOT 0.5  --   --    ------------------------------------------------------------------------------------------------------------------ estimated creatinine clearance is 57 mL/min (by C-G formula based on Cr of 1.23). ------------------------------------------------------------------------------------------------------------------ No results for input(s): HGBA1C in the last 72 hours. ------------------------------------------------------------------------------------------------------------------ No results for input(s): CHOL, HDL, LDLCALC, TRIG, CHOLHDL, LDLDIRECT in the last 72  hours. ------------------------------------------------------------------------------------------------------------------ No results for input(s): TSH, T4TOTAL, T3FREE, THYROIDAB in the last 72 hours.  Invalid input(s): FREET3 ------------------------------------------------------------------------------------------------------------------ No results for input(s): VITAMINB12, FOLATE, FERRITIN, TIBC, IRON, RETICCTPCT in the last 72 hours.  Coagulation profile No results for input(s): INR, PROTIME in the last 168 hours.  No results for input(s): DDIMER in the last 72 hours.  Cardiac Enzymes No results for input(s): CKMB, TROPONINI, MYOGLOBIN in the last 168 hours.  Invalid input(s): CK ------------------------------------------------------------------------------------------------------------------ Invalid input(s): POCBNP   CBG:  Recent Labs Lab 07/11/15 2116 07-23-15 0649 July 23, 2015 1644 07/23/2015 2121 07/13/15 0640  GLUCAP 107* 143* 110* 120* 130*       Studies: Mr Kizzie Fantasia Contrast  23-Jul-2015  CLINICAL DATA:  Follow-up possible brain metastasis. EXAM: MRI HEAD WITHOUT AND WITH CONTRAST TECHNIQUE: Multiplanar, multiecho pulse sequences of the brain and surrounding structures were obtained without and with intravenous contrast. CONTRAST:  13m MULTIHANCE GADOBENATE DIMEGLUMINE 529 MG/ML IV SOLN COMPARISON:  Brain MRI 06/14/2015 and 06/26/2015 FINDINGS: Calvarium and upper cervical spine: No focal marrow signal abnormality. Orbits: Bilateral cataract resection. Sinuses and Mastoids: Persistent bilateral mastoid effusion. Sphenoid sinusitis has improved. Brain: Parenchymal hemorrhage subcortical posterior left frontal has contracted. There is progressive hemosiderin staining with rim of methemoglobin along the cavity wall. On 5:20 the hematoma measures 23 x 14 mm today. Small regional subarachnoid hemorrhage is diminished. Persistent restricted diffusion subcortical below the  hematoma cavity. Cortical restricted diffusion has diminished from prior. Even when accounting for methemoglobin rim, there is enhancement around the hematoma cavity, expected, which obscures the nodular enhancement seen on the initial scan. There is new cortical enhancement along the previously seen acute infarct. A nodular area of frontal parietal cortex enhancement is stable in size compared to initial scan 06/14/2015, 10 mm in diameter on 11:43. Separate from this area, there is no evidence of new enhancement. Postcontrast and other sequences are limited by motion, which could obscure subtle lesion. Stable lateral ventriculomegaly with prominent flow void and cerebral aqua duct. Still, communicating hydrocephalus is considered unlikely given the degree of generalized volume loss the lack third ventricular dilatation. No interval infarct.  No evidence of major vessel occlusion. IMPRESSION: 1. Progressive posterior left frontal enhancement related to infarct seen 06/26/2015. Persistent rim enhancement around the contracting posterior left frontal parenchymal hematoma which obscures the nodular enhancement seen on initial scan 06/14/2015. 10 mm cortical enhancement posterior to the hematoma is stable since initial scan. This expected subacute infarct enhancement and hematoma cavity enhancement could obscure metastatic enhancement. Given the evolution of findings, it is possible the enhancement on the initial scan was also subacute infarct, with superimposed hemorrhagic conversion. Continued imaging follow-up is recommended. Post infarct enhancement can persist  for up to 3 months, but should be diminishing by 6 weeks. 2. Motion degraded exam without enhancement other than above. 3. Bilateral mastoiditis.  Improved sinusitis. Electronically Signed   By: Monte Fantasia M.D.   On: 07/12/2015 14:54      Lab Results  Component Value Date   HGBA1C 6.8* 06/14/2015   HGBA1C 7.4* 04/17/2015   HGBA1C 8.0* 05/22/2013    Lab Results  Component Value Date   LDLCALC 145* 06/14/2015   CREATININE 1.23 07/11/2015       Scheduled Meds: . amiodarone  200 mg Oral Daily  . antiseptic oral rinse  7 mL Mouth Rinse BID  . atorvastatin  40 mg Oral q1800  . diclofenac sodium  2 g Topical QID  . diltiazem  60 mg Oral BID  . insulin aspart  0-24 Units Subcutaneous TID AC & HS  . iron polysaccharides  150 mg Oral Daily  . lactose free nutrition  237 mL Oral TID WC  . levETIRAcetam  1,000 mg Intravenous Q12H  . metFORMIN  1,000 mg Oral Q breakfast  . metFORMIN  500 mg Oral Q supper  . pantoprazole sodium  40 mg Oral Daily  . rivaroxaban  15 mg Oral Q supper  . vitamin B-12  1,000 mcg Oral Daily   Continuous Infusions: . sodium chloride 50 mL/hr at 07/12/15 1739     LOS: 22 days    Time spent: >20 [place] [facility] Pager [phone]. If 7PM-7AM, please contact night-coverage at [email or web address], password [facility] 07/13/2015, 8:40 AM  LOS: 22 days

## 2015-07-13 NOTE — Progress Notes (Signed)
Orthopedic Tech Progress Note Patient Details:  Ivan Beasley 1940-05-08 [id number]  Ortho Devices Type of Ortho Device: Arm sling Ortho Device/Splint Location: rue Ortho Device/Splint Interventions: Application   [name] 07/13/2015, 10:56 AM

## 2015-07-13 NOTE — Progress Notes (Signed)
Physical Therapy Note  Patient Details  Name: JOSEDEJESUS MARCUM MRN: [id number] Date of Birth: 04/03/1940 Today's Date: 07/13/2015  Pt asleep in bed upon PT arrival.  Awakes to tactile stimulation but unable to keep eyes open.  Left supine in bed with call bell in reach and needs met.  Missed 30 minutes skilled PT.    Aliana Kreischer E Penven-Crew 07/13/2015, 4:28 PM

## 2015-07-13 NOTE — Progress Notes (Signed)
Physical Therapy Session Note  Patient Details  Name: Ivan Beasley MRN: [id number] Date of Birth: 04/18/1940  Today's Date: 07/13/2015 PT Individual Time: 1415-1425 PT Individual Time Calculation (min): 10 min   Short Term Goals: Week 3:  PT Short Term Goal 1 (Week 3): =LTG due to estimated length of stay  Skilled Therapeutic Interventions/Progress Updates:   Patient asleep upon arrival with wife reporting increased back pain this AM after MRI yesterday and no sleep last night. [name] on her way home to prepare room for hospital bed but reviewed hoyer lift training from yesterday's PT session and wife reports feeling comfortable with slide board transfers and will not use hoyer lift unless absolutely necessary. Patient missed 50 min skilled physical therapy due to fatigue/sedation, will f/u as able.  Therapy Documentation Precautions:  Precautions Precautions: Fall Precaution Comments: R hemi, pt on home O2 PTA for recent pneumonia (but used it PRN, not 24/7) Restrictions Weight Bearing Restrictions: No General: PT Amount of Missed Time (min): 50 Minutes PT Missed Treatment Reason: Patient fatigue   See Function Navigator for Current Functional Status.   Therapy/Group: Individual Therapy  Laretta Alstrom 07/13/2015, 2:28 PM

## 2015-07-13 NOTE — Progress Notes (Signed)
SLP Cancellation Note  Patient Details Name: Ivan Beasley MRN: [id number] DOB: 1940/05/13   Cancelled treatment:        Attempted to see pt for skilled ST.  Pt asleep upon arrival.  Per wife, pt sedated due to pain and did not sleep at all last night.  As a result, pt missed 30 minutes of ST due to decreased level of alertness.                                                                                                  Deveney Bayon, [name] 07/13/2015, 10:58 AM

## 2015-07-13 NOTE — Progress Notes (Signed)
Subjective/Complaints: Pt sitting up in bed. Overnight, he complained of chest pain, for which workup was relatively unremarkable.  This AM, he appears comfortable, but complains of back pain and perseverates on voltaren gel being applied.   Review of systems: limited by dysarthria and ?aphasia, however, appears to deny CP, SOB, N/V/D.  Objective: Vital Signs: Blood pressure 124/60, pulse 97, temperature 98.4 F (36.9 C), temperature source Oral, resp. rate 18, weight 91.8 kg (202 lb 6.1 oz), SpO2 90 %. [name] Wo Contrast  07/12/2015  CLINICAL DATA:  Follow-up possible brain metastasis. EXAM: MRI HEAD WITHOUT AND WITH CONTRAST TECHNIQUE: Multiplanar, multiecho pulse sequences of the brain and surrounding structures were obtained without and with intravenous contrast. CONTRAST:  50m MULTIHANCE GADOBENATE DIMEGLUMINE 529 MG/ML IV SOLN COMPARISON:  Brain MRI 06/14/2015 and 06/26/2015 FINDINGS: Calvarium and upper cervical spine: No focal marrow signal abnormality. Orbits: Bilateral cataract resection. Sinuses and Mastoids: Persistent bilateral mastoid effusion. Sphenoid sinusitis has improved. Brain: Parenchymal hemorrhage subcortical posterior left frontal has contracted. There is progressive hemosiderin staining with rim of methemoglobin along the cavity wall. On 5:20 the hematoma measures 23 x 14 mm today. Small regional subarachnoid hemorrhage is diminished. Persistent restricted diffusion subcortical below the hematoma cavity. Cortical restricted diffusion has diminished from prior. Even when accounting for methemoglobin rim, there is enhancement around the hematoma cavity, expected, which obscures the nodular enhancement seen on the initial scan. There is new cortical enhancement along the previously seen acute infarct. A nodular area of frontal parietal cortex enhancement is stable in size compared to initial scan 06/14/2015, 10 mm in diameter on 11:43. Separate from this area, there is no  evidence of new enhancement. Postcontrast and other sequences are limited by motion, which could obscure subtle lesion. Stable lateral ventriculomegaly with prominent flow void and cerebral aqua duct. Still, communicating hydrocephalus is considered unlikely given the degree of generalized volume loss the lack third ventricular dilatation. No interval infarct.  No evidence of major vessel occlusion. IMPRESSION: 1. Progressive posterior left frontal enhancement related to infarct seen 06/26/2015. Persistent rim enhancement around the contracting posterior left frontal parenchymal hematoma which obscures the nodular enhancement seen on initial scan 06/14/2015. 10 mm cortical enhancement posterior to the hematoma is stable since initial scan. This expected subacute infarct enhancement and hematoma cavity enhancement could obscure metastatic enhancement. Given the evolution of findings, it is possible the enhancement on the initial scan was also subacute infarct, with superimposed hemorrhagic conversion. Continued imaging follow-up is recommended. Post infarct enhancement can persist for up to 3 months, but should be diminishing by 6 weeks. 2. Motion degraded exam without enhancement other than above. 3. Bilateral mastoiditis.  Improved sinusitis. Electronically Signed   By: JMonte FantasiaM.D.   On: 07/12/2015 14:54   Results for orders placed or performed during the hospital encounter of 06/20/2015 (from the past 72 hour(s))  Glucose, capillary     Status: Abnormal   Collection Time: 07/10/15 12:22 PM  Result Value Ref Range   Glucose-Capillary 120 (H) 65 - 99 mg/dL  Glucose, capillary     Status: Abnormal   Collection Time: 07/10/15  4:31 PM  Result Value Ref Range   Glucose-Capillary 130 (H) 65 - 99 mg/dL  Glucose, capillary     Status: Abnormal   Collection Time: 07/10/15  9:08 PM  Result Value Ref Range   Glucose-Capillary 118 (H) 65 - 99 mg/dL  Occult blood card to lab, stool RN will collect  Status: None   Collection Time: 07/10/15 10:06 PM  Result Value Ref Range   Fecal Occult Bld NEGATIVE NEGATIVE  Renal function panel     Status: Abnormal   Collection Time: 07/11/15  6:00 AM  Result Value Ref Range   Sodium 138 135 - 145 mmol/L   Potassium 4.3 3.5 - 5.1 mmol/L   Chloride 105 101 - 111 mmol/L   CO2 24 22 - 32 mmol/L   Glucose, Bld 110 (H) 65 - 99 mg/dL   BUN 13 6 - 20 mg/dL   Creatinine, Ser 1.23 0.61 - 1.24 mg/dL   Calcium 9.2 8.9 - 10.3 mg/dL   Phosphorus 3.5 2.5 - 4.6 mg/dL   Albumin 2.6 (L) 3.5 - 5.0 g/dL   GFR calc non Af Amer 56 (L) >60 mL/min   GFR calc Af Amer >60 >60 mL/min    Comment: (NOTE) The eGFR has been calculated using the CKD EPI equation. This calculation has not been validated in all clinical situations. eGFR's persistently <60 mL/min signify possible Chronic Kidney Disease.    Anion gap 9 5 - 15  CBC with Differential/Platelet     Status: Abnormal   Collection Time: 07/11/15  6:00 AM  Result Value Ref Range   WBC 3.9 (L) 4.0 - 10.5 K/uL   RBC 3.00 (L) 4.22 - 5.81 MIL/uL   Hemoglobin 8.2 (L) 13.0 - 17.0 g/dL   HCT 26.7 (L) 39.0 - 52.0 %   MCV 89.0 78.0 - 100.0 fL   MCH 27.3 26.0 - 34.0 pg   MCHC 30.7 30.0 - 36.0 g/dL   RDW 16.7 (H) 11.5 - 15.5 %   Platelets 208 150 - 400 K/uL   Neutrophils Relative % 59 %   Neutro Abs 2.3 1.7 - 7.7 K/uL   Lymphocytes Relative 16 %   Lymphs Abs 0.6 (L) 0.7 - 4.0 K/uL   Monocytes Relative 16 %   Monocytes Absolute 0.6 0.1 - 1.0 K/uL   Eosinophils Relative 9 %   Eosinophils Absolute 0.4 0.0 - 0.7 K/uL   Basophils Relative 0 %   Basophils Absolute 0.0 0.0 - 0.1 K/uL  Glucose, capillary     Status: Abnormal   Collection Time: 07/11/15  6:39 AM  Result Value Ref Range   Glucose-Capillary 107 (H) 65 - 99 mg/dL  Glucose, capillary     Status: Abnormal   Collection Time: 07/11/15 11:28 AM  Result Value Ref Range   Glucose-Capillary 125 (H) 65 - 99 mg/dL   Comment 1 Notify RN   Glucose, capillary      Status: Abnormal   Collection Time: 07/11/15  4:28 PM  Result Value Ref Range   Glucose-Capillary 159 (H) 65 - 99 mg/dL  Glucose, capillary     Status: Abnormal   Collection Time: 07/11/15  9:16 PM  Result Value Ref Range   Glucose-Capillary 107 (H) 65 - 99 mg/dL   Comment 1 Notify RN    Comment 2 Document in Chart   Glucose, capillary     Status: Abnormal   Collection Time: 07/12/15  6:49 AM  Result Value Ref Range   Glucose-Capillary 143 (H) 65 - 99 mg/dL  Glucose, capillary     Status: Abnormal   Collection Time: 07/12/15  4:44 PM  Result Value Ref Range   Glucose-Capillary 110 (H) 65 - 99 mg/dL  Glucose, capillary     Status: Abnormal   Collection Time: 07/12/15  9:21 PM  Result Value Ref Range  Glucose-Capillary 120 (H) 65 - 99 mg/dL  Glucose, capillary     Status: Abnormal   Collection Time: 07/13/15  6:40 AM  Result Value Ref Range   Glucose-Capillary 130 (H) 65 - 99 mg/dL  Troponin I (q 6hr x 3)     Status: None   Collection Time: 07/13/15  8:00 AM  Result Value Ref Range   Troponin I <0.03 <0.03 ng/mL    HEENT: Normocephalic. Atraumatic Cardio: irregular irregular Resp: CTA B/L and unlabored. No wheezes or rales GI: BS positive and nontender nondistended Skin:   Intact. Warm and dry. Neuro: Flat. Perseverative. Motor RUE/RLE: remains 0/5 MAS: left elbow flexion 1+/4, finger flexors 1/4 Moving LUE/LUE without difficulty Dysarthric and Aphasic Right facial weakness Musc/Skel:  No edema. No tenderness. Gen. no acute distress.  Vital signs reviewed.  Assessment/Plan: 1. 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 Physiatrist is providing close team supervision and 24 hour management of active medical problems listed below. Physiatrist and rehab team continue to assess barriers to discharge/monitor patient progress toward  functional and medical goals. FIM: Function - Bathing Position: Bed Body parts bathed by patient: Chest, Abdomen, Right arm, Front perineal area Body parts bathed by helper: Back, Buttocks, Right upper leg, Left upper leg, Right lower leg, Left lower leg Bathing not applicable: Right upper leg, Left upper leg, Right lower leg, Left lower leg Assist Level: 2 helpers  Function- Upper Body Dressing/Undressing What is the patient wearing?: Hospital gown Pull over shirt/dress - Perfomed by patient: Thread/unthread left sleeve, Put head through opening Pull over shirt/dress - Perfomed by helper: Thread/unthread right sleeve, Pull shirt over trunk Assist Level: 2 helpers Function - Lower Body Dressing/Undressing What is the patient wearing?: Pants, Non-skid slipper socks Position: Bed Pants- Performed by helper: Thread/unthread right pants leg, Thread/unthread left pants leg, Pull pants up/down Non-skid slipper socks- Performed by helper: Don/doff right sock, Don/doff left sock Assist for footwear: Dependant Assist for lower body dressing:  (Total assist)  Function - Toileting Toileting activity did not occur: No continent bowel/bladder event Assist level: Two helpers (per [employer], NT)  Function - Air cabin crew transfer activity did not occur: Safety/medical concerns Assist level to toilet: 2 helpers (per Berkley Harvey, NT) Assist level from toilet: 2 helpers  Function - Chair/bed transfer Chair/bed transfer activity did not occur: Safety/medical concerns Chair/bed transfer method: Lateral scoot Chair/bed transfer assist level: Maximal assist (Pt 25 - 49%/lift and lower) Chair/bed transfer assistive device: Sliding board, Bedrails Mechanical lift: Stedy Chair/bed transfer details: Verbal cues for precautions/safety, Verbal cues for sequencing, Verbal cues for technique  Function - Locomotion: Wheelchair Will patient use wheelchair at discharge?: Yes Type: Manual Max  wheelchair distance: 10 Assist Level: Dependent (Pt equals 0%) Assist Level: Maximal assistance (Pt 25 - 49%) Wheel 150 feet activity did not occur: Safety/medical concerns Turns around,maneuvers to table,bed, and toilet,negotiates 3% grade,maneuvers on rugs and over doorsills: No Function - Locomotion: Ambulation Ambulation activity did not occur: Safety/medical concerns Assistive device: Other (comment) (three muskateers) Max distance: 30 Assist level: 2 helpers Assist level: 2 helpers  Function - Comprehension Comprehension: Auditory Comprehension assistive device: Hearing aids Comprehension assist level: Understands basic 75 - 89% of the time/ requires cueing 10 - 24% of the time  Function - Expression Expression: Verbal Expression assist level: Expresses basic 50 - 74% of the time/requires cueing 25 - 49%  of the time. Needs to repeat parts of sentences.  Function - Social Interaction Social Interaction assist level: Interacts appropriately 50 - 74% of the time - May be physically or verbally inappropriate.  Function - Problem Solving Problem solving assist level: Solves basic 50 - 74% of the time/requires cueing 25 - 49% of the time  Function - Memory Memory assist level: Recognizes or recalls 50 - 74% of the time/requires cueing 25 - 49% of the time Patient normally able to recall (first 3 days only): That he or she is in a hospital, Staff names and faces    Medical Problem List and Plan: 1.  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   Cont CIR  Repeat MRI reviewed, with progression of infarct  Orthotics for RUE  Palliative care recs appreciated, it appears pt and family have decided not to pursue any further interventions 2.  DVT Prophylaxis/Anticoagulation: SCDs. Monitor for signs of DVT, Xarelto should help with this as well 3. Pain Management: Ultram and oxycodone as needed 4. Mood: Xanax 0.25 mg 3 times daily as needed 5.  Neuropsych: This patient is not capable of making decisions on his own behalf with caregiver/staff assistance. 6. Skin/Wound Care: Routine skin checks 7. Fluids/Electrolytes/Nutrition: Routine I&O's with follow-up chemistries,severe swallowing difficulties, meal intake usually around 50-85%   8. Seizure prophylaxis. Keppra now at 1000 mg twice a day---more alert. EEG displayed seizure activity 9. Atrial fibrillation. Cardiac rate control. Continue amiodarone 200 mg daily, Cardizem 60 mg twice a day.Eliquis held secondary to [place], family does not want to resume, would be comfortable with Xarelto , Initiated low-dose due to stable hemoglobin 10. 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 This is likely the etiology of the persistent cough, Right lung nodules,As noted plan follow-up MRI after ICH absorbed for further evaluation of possible metastasis 11. Diabetes mellitus with peripheral neuropathy. Hemoglobin A1c 6.8. Glucophage 1000 mg at breakfast and 500 mg supper ,  Amaryl 1 mg daily. Check blood sugars before meals and at bedtime Generally well controlled on the current regimen CBG (last 3)   Recent Labs  07/12/15 1644 07/12/15 2121 07/13/15 0640  GLUCAP 110* 120* 130*  12. Hyperlipidemia. Lipitor 13. UTI enterobacter cloacae- Sensitive to ceftriaxone. Started 1 g every 24 hours.  Afebrile , 7 day course    Completed.    14. Anemia:  Stool guaiac negative X 2,    As per internal medicine now on Xarelto. This was also Recommended by neurology.  Hemoglobin stable at 8.2 on 7/2.    Cont to monitor 15. CKD  Cr. 1.23 on 7/2   16.HTN  Slightly labile  Will cont to monitor  LOS (Days) 22 A FACE TO FACE EVALUATION WAS PERFORMED  Salam Micucci Lorie Phenix 07/13/2015, 9:19 AM

## 2015-07-13 NOTE — Progress Notes (Signed)
At 0530 patient complaining of chest pain again. Marlowe Shores PA notified again. New orders received. At 0630 patient complained of back pain and denied chest pain. Marlowe Shores PA received results. No new orders. Continue to monitor.

## 2015-07-13 NOTE — Progress Notes (Signed)
Occupational Therapy Session Note  Patient Details  Name: Ivan Beasley MRN: [id number] Date of Birth: 09-08-1940  Today's Date: 07/13/2015 OT Individual Time: 2641-5830 OT Individual Time Calculation (min): 45 min    Short Term Goals: Week 3:  OT Short Term Goal 1 (Week 3): STGs = LTGs based on modified LTGs  Skilled Therapeutic Interventions/Progress Updates:    Treatment session with focus on bed mobility and repositioning.  Pt in bed upon arrival with c/o back pain and obvious frustration.  Engaged in lengthy discussion to discover source of pain and frustration.  Pt declined OOB activity however willing to complete modified bathing task at bed level with pt rolling Rt with mod assist and Lt with max-total assist.  Completed perineal hygiene and donned clean brief and pants in sidelying and supine.  Pt reporting "I can't" when encouraged to complete UB bathing and dressing seated at EOB.  Upon further discussion able to identify increased pain in upper back/shoulder blade area and pt unwilling/unable to engage in sitting activity.  Repositioned in sidelying with pillows to maintain positioning, with pt reporting some relief. Wife present during second half of session, engaged in discussion regarding d/c planning and pain management.  Session terminated early due to pain and decreased participation.  Therapy Documentation Precautions:  Precautions Precautions: Fall Precaution Comments: R hemi, pt on home O2 PTA for recent pneumonia (but used it PRN, not 24/7) Restrictions Weight Bearing Restrictions: No General: General OT Amount of Missed Time: 15 Minutes Vital Signs: Therapy Vitals Temp: 98.4 F (36.9 C) Temp Source: Oral Pulse Rate: 97 Resp: 18 BP: 124/60 mmHg Patient Position (if appropriate): Lying Oxygen Therapy SpO2: 90 % O2 Device: Not Delivered Pain: Pain Assessment Pain Assessment: Faces Faces Pain Scale: Hurts whole lot Pain Type: Acute pain Pain Location:  Back Pain Descriptors / Indicators: Aching;Discomfort;Grimacing Pain Intervention(s): Repositioned;Medication (See eMAR);Emotional support  See Function Navigator for Current Functional Status.   Therapy/Group: Individual Therapy  [name] 07/13/2015, 10:06 AM

## 2015-07-14 ENCOUNTER — Inpatient Hospital Stay (HOSPITAL_COMMUNITY): Payer: Medicare HMO | Admitting: Physical Therapy

## 2015-07-14 ENCOUNTER — Inpatient Hospital Stay (HOSPITAL_COMMUNITY): Payer: Medicare HMO | Admitting: Speech Pathology

## 2015-07-14 ENCOUNTER — Inpatient Hospital Stay (HOSPITAL_COMMUNITY): Payer: Medicare HMO | Admitting: Occupational Therapy

## 2015-07-14 DIAGNOSIS — I612 Nontraumatic intracerebral hemorrhage in hemisphere, unspecified: Secondary | ICD-10-CM

## 2015-07-14 DIAGNOSIS — R0989 Other specified symptoms and signs involving the circulatory and respiratory systems: Secondary | ICD-10-CM | POA: Insufficient documentation

## 2015-07-14 DIAGNOSIS — R5383 Other fatigue: Secondary | ICD-10-CM | POA: Insufficient documentation

## 2015-07-14 LAB — CBC
HCT: 27.5 % — ABNORMAL LOW (ref 39.0–52.0)
HEMOGLOBIN: 8.4 g/dL — AB (ref 13.0–17.0)
MCH: 26.8 pg (ref 26.0–34.0)
MCHC: 30.5 g/dL (ref 30.0–36.0)
MCV: 87.6 fL (ref 78.0–100.0)
Platelets: 268 10*3/uL (ref 150–400)
RBC: 3.14 MIL/uL — ABNORMAL LOW (ref 4.22–5.81)
RDW: 16.5 % — AB (ref 11.5–15.5)
WBC: 7.1 10*3/uL (ref 4.0–10.5)

## 2015-07-14 LAB — BASIC METABOLIC PANEL
Anion gap: 9 (ref 5–15)
BUN: 10 mg/dL (ref 6–20)
CALCIUM: 9.2 mg/dL (ref 8.9–10.3)
CHLORIDE: 102 mmol/L (ref 101–111)
CO2: 23 mmol/L (ref 22–32)
CREATININE: 1.14 mg/dL (ref 0.61–1.24)
GFR calc Af Amer: >60 mL/min (ref >60)
GFR calc non Af Amer: >60 mL/min (ref >60)
Glucose, Bld: 151 mg/dL — ABNORMAL HIGH (ref 65–99)
Potassium: 4.1 mmol/L (ref 3.5–5.1)
SODIUM: 134 mmol/L — AB (ref 135–145)

## 2015-07-14 LAB — GLUCOSE, CAPILLARY
GLUCOSE-CAPILLARY: 150 mg/dL — AB (ref 65–99)
Glucose-Capillary: 151 mg/dL — ABNORMAL HIGH (ref 65–99)

## 2015-07-14 MED ORDER — GLYCOPYRROLATE 0.2 MG/ML IJ SOLN
0.2000 mg | INTRAMUSCULAR | Status: DC | PRN
  Administered 2015-07-14: 0.2 mg via INTRAVENOUS
  Filled 2015-07-14 (×4): qty 1

## 2015-07-14 MED ORDER — MORPHINE SULFATE (PF) 2 MG/ML IV SOLN
1.0000 mg | INTRAVENOUS | Status: DC
  Administered 2015-07-14: 1 mg via INTRAVENOUS
  Filled 2015-07-14: qty 1

## 2015-07-14 MED ORDER — ATROPINE SULFATE 1 % OP SOLN
2.0000 [drp] | OPHTHALMIC | Status: AC
  Administered 2015-07-14: 2 [drp] via SUBLINGUAL
  Filled 2015-07-14: qty 2

## 2015-07-14 MED ORDER — SODIUM CHLORIDE 0.9 % IV SOLN
INTRAVENOUS | Status: DC
  Administered 2015-07-14: 1 mL via INTRAVENOUS

## 2015-07-14 MED ORDER — MORPHINE SULFATE (PF) 2 MG/ML IV SOLN
INTRAVENOUS | Status: AC
  Administered 2015-07-14: 1 mg via INTRAVENOUS
  Filled 2015-07-14: qty 1

## 2015-07-14 MED ORDER — LORAZEPAM 2 MG/ML IJ SOLN
1.0000 mg | INTRAMUSCULAR | Status: DC | PRN
  Administered 2015-07-14: 1 mg via INTRAVENOUS
  Filled 2015-07-14: qty 1

## 2015-07-14 MED ORDER — LORAZEPAM 2 MG/ML IJ SOLN
INTRAMUSCULAR | Status: AC
  Administered 2015-07-14: 1 mg
  Filled 2015-07-14: qty 1

## 2015-07-14 MED ORDER — HALOPERIDOL LACTATE 5 MG/ML IJ SOLN: 0.5000 mg | INTRAMUSCULAR | Status: DC | PRN

## 2015-07-14 MED ORDER — ATROPINE SULFATE 1 % OP SOLN
2.0000 [drp] | OPHTHALMIC | Status: DC | PRN
Start: 2015-07-14 — End: 2015-07-15
  Administered 2015-07-14: 2 [drp] via SUBLINGUAL
  Filled 2015-07-14: qty 2

## 2015-07-14 MED ORDER — HALOPERIDOL LACTATE 2 MG/ML PO CONC
0.5000 mg | ORAL | Status: DC | PRN
  Filled 2015-07-14: qty 0.3

## 2015-07-14 MED ORDER — ONDANSETRON HCL 4 MG/2ML IJ SOLN
4.0000 mg | Freq: Four times a day (QID) | INTRAMUSCULAR | Status: DC | PRN
Start: 2015-07-14 — End: 2015-07-14

## 2015-07-14 MED ORDER — MORPHINE SULFATE 25 MG/ML IV SOLN
1.0000 mg/h | INTRAVENOUS | Status: DC
  Administered 2015-07-14: 1 mg/h via INTRAVENOUS
  Filled 2015-07-14: qty 10

## 2015-07-14 MED ORDER — LORAZEPAM 2 MG/ML IJ SOLN
1.0000 mg | Freq: Four times a day (QID) | INTRAMUSCULAR | Status: DC
  Administered 2015-07-14: 1 mg via INTRAVENOUS

## 2015-07-14 MED ORDER — POLYVINYL ALCOHOL 1.4 % OP SOLN
1.0000 [drp] | Freq: Four times a day (QID) | OPHTHALMIC | Status: DC | PRN
  Filled 2015-07-14: qty 15

## 2015-07-14 MED ORDER — MORPHINE SULFATE (PF) 2 MG/ML IV SOLN
1.0000 mg | INTRAVENOUS | Status: DC | PRN
Start: 2015-07-14 — End: 2015-07-14

## 2015-07-14 MED ORDER — LORAZEPAM 1 MG PO TABS: 1.0000 mg | ORAL_TABLET | ORAL | Status: DC | PRN

## 2015-07-14 MED ORDER — MORPHINE SULFATE (PF) 2 MG/ML IV SOLN: 2.0000 mg | INTRAVENOUS | Status: DC | PRN

## 2015-07-14 MED ORDER — BIOTENE DRY MOUTH MT LIQD: 15.0000 mL | OROMUCOSAL | Status: DC | PRN

## 2015-07-14 MED ORDER — ATROPINE SULFATE 1 % OP SOLN
2.0000 [drp] | OPHTHALMIC | Status: AC
Start: 2015-07-14 — End: 2015-07-14
  Administered 2015-07-14: 2 [drp] via SUBLINGUAL

## 2015-07-14 MED ORDER — SODIUM CHLORIDE 0.9 % IV SOLN
510.0000 mg | Freq: Once | INTRAVENOUS | Status: AC
  Administered 2015-07-14: 510 mg via INTRAVENOUS
  Filled 2015-07-14 (×2): qty 17

## 2015-07-14 MED ORDER — MORPHINE SULFATE (PF) 2 MG/ML IV SOLN: 2.0000 mg | INTRAVENOUS | Status: DC

## 2015-07-14 MED ORDER — LORAZEPAM 2 MG/ML PO CONC
1.0000 mg | ORAL | Status: DC | PRN
  Filled 2015-07-14: qty 0.5

## 2015-07-14 MED ORDER — GLYCOPYRROLATE 0.2 MG/ML IJ SOLN
0.2000 mg | INTRAMUSCULAR | Status: DC | PRN
  Filled 2015-07-14: qty 1

## 2015-07-14 MED ORDER — ONDANSETRON 4 MG PO TBDP: 4.0000 mg | ORAL_TABLET | Freq: Four times a day (QID) | ORAL | Status: DC | PRN

## 2015-07-14 MED ORDER — GLYCOPYRROLATE 1 MG PO TABS
1.0000 mg | ORAL_TABLET | ORAL | Status: DC | PRN
  Filled 2015-07-14 (×2): qty 1

## 2015-07-14 MED ORDER — GLYCOPYRROLATE 0.2 MG/ML IJ SOLN
0.2000 mg | INTRAMUSCULAR | Status: DC | PRN
  Filled 2015-07-14 (×4): qty 1

## 2015-07-14 MED ORDER — HALOPERIDOL 0.5 MG PO TABS
0.5000 mg | ORAL_TABLET | ORAL | Status: DC | PRN
  Filled 2015-07-14: qty 1

## 2015-07-14 MED ORDER — MORPHINE SULFATE (PF) 2 MG/ML IV SOLN
INTRAVENOUS | Status: AC
  Administered 2015-07-14: 2 mg via INTRAVENOUS
  Filled 2015-07-14: qty 1

## 2015-07-14 MED ORDER — ATROPINE SULFATE 1 % OP SOLN
2.0000 [drp] | Freq: Three times a day (TID) | OPHTHALMIC | Status: DC
Start: 2015-07-14 — End: 2015-07-15
  Filled 2015-07-14: qty 2

## 2015-07-14 MED ORDER — MORPHINE SULFATE (PF) 2 MG/ML IV SOLN: 1.0000 mg | INTRAVENOUS | Status: DC | PRN

## 2015-07-14 MED ORDER — GLYCOPYRROLATE 0.2 MG/ML IJ SOLN
0.4000 mg | INTRAMUSCULAR | Status: DC
  Administered 2015-07-14 (×2): 0.4 mg via INTRAVENOUS
  Filled 2015-07-14 (×6): qty 2

## 2015-07-15 ENCOUNTER — Inpatient Hospital Stay (HOSPITAL_COMMUNITY): Payer: Medicare HMO | Admitting: Occupational Therapy

## 2015-07-15 ENCOUNTER — Telehealth: Payer: Self-pay | Admitting: Physical Medicine & Rehabilitation

## 2015-07-15 ENCOUNTER — Ambulatory Visit: Payer: Medicare HMO | Admitting: Radiation Oncology

## 2015-07-15 NOTE — Telephone Encounter (Signed)
Ivan Beasley with [facility] needs to know if Dr. Letta Pate will be signing the death certificate.  Please call her at [phone].

## 2015-07-19 NOTE — Telephone Encounter (Signed)
It was signed last week.  Thanks

## 2015-07-19 NOTE — Telephone Encounter (Signed)
I was out of town when patient expired. Dr. Posey Pronto was covering me

## 2015-07-22 ENCOUNTER — Other Ambulatory Visit: Payer: Medicare HMO

## 2015-07-23 ENCOUNTER — Other Ambulatory Visit: Payer: Medicare HMO

## 2015-07-23 ENCOUNTER — Ambulatory Visit: Payer: Medicare HMO | Admitting: Internal Medicine

## 2015-07-23 ENCOUNTER — Telehealth: Payer: Self-pay

## 2015-08-10 NOTE — Discharge Summary (Signed)
NAMECHIVAS, NOTZ NO.:  [id number]  MEDICAL RECORD NO.:  [id number]  LOCATION:  4W11C                        FACILITY:  [facility]  PHYSICIAN:  Lauraine Rinne, P.A.  DATE OF BIRTH:  Apr 10, 1940  DATE OF ADMISSION:  06/24/2015 DATE OF DISCHARGE:  July 16, 2015                              DISCHARGE SUMMARY   DISCHARGE DIAGNOSES: 1. 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 2. SCDs for DVT prophylaxis. 3. Pain management. 4. Atrial fibrillation. 5. Recent diagnosis of left lung squamous cell carcinoma. 6. Atrial fibrillation. 7. Diabetes mellitus. 8. Hyperlipidemia. 9. Anemia. 10.Decreased nutritional storage.  HISTORY OF PRESENT ILLNESS:  This is a 75 year old right-handed male with history of diabetes mellitus, coronary artery disease with CABG, atrial fibrillation/on Eliquis, and recent diagnosis of left lung squamous cell cancer with subsequent chemotherapy/radiation but developed abnormal lymph node enlargement treated with immunosuppressant therapy in April of 2017.  The patient was independent prior to admission, driving for short distances.  One-level home with 2 steps to entry.  Presented on June 13, 2015 at [facility] with altered mental status and right-sided weakness.  CT of the head revealed left frontal intracerebral hemorrhage with subarachnoid hemorrhage. Eliquis was discontinued.  Transferred to [facility] for further evaluation, where an MRI of the brain showed posterior left frontal lobe complex 2.5 x 2.2 x 2 cm hemorrhagic lesion with marked surrounding edema.  Maintained on Keppra for seizure prophylaxis.  EEG consistent with encephalopathy, question seizure.  Recent echocardiogram with ejection fraction of 50% without embolus.  Lower extremity Dopplers negative.  Plan was to repeat MRI of the brain for further evaluation  of possible metastasis to posterior left frontal lobe after ICH is absorbed.  No current plan to resume Eliquis at this time.  The patient was admitted for a comprehensive rehab program.  PAST MEDICAL HISTORY:  See discharge diagnoses.  SOCIAL HISTORY:  Lives with wife.  Independent prior to admission. Functional status upon admission to rehab services was max assist for side lying in the bed, total lift for general transfers, max total assist with activities of daily living.  PHYSICAL EXAMINATION:  VITAL SIGNS:  Blood pressure 127/68, pulse 88, temperature 98, respirations 18. GENERAL:  This was a lethargic, but arousable male, oriented to person and place. LUNGS:  Decreased breath sounds.  Clear to auscultation. CARDIAC:  Irregularly irregular. ABDOMEN:  Soft, nontender.  Good bowel sounds.  Dense right-sided weakness.  REHABILITATION HOSPITAL COURSE:  The patient was admitted to inpatient rehab services with therapies initiated on a 3-hour daily basis, consisting of physical therapy, occupational therapy, speech therapy, and rehabilitation nursing.  The following issues were addressed during the patient's rehabilitation stay.  Pertaining to Mr. Olmo, left frontal ICH with adjacent SAH while on Eliquis.  His Eliquis had since been discontinued.  Followed by Neurology Services.  Repeat MRI showed progression of infarct, but no further bleeding episodes.  It was at the recommendations that his Eliquis be discontinued permanently and placed on Xarelto after most recent scan of the brain on June 26, 2015.  No bleeding episodes.  However, acute-on-chronic anemia, followed by  Internal Medicine.  He remained on Keppra for seizure prophylaxis. Venous Doppler studies of lower extremity showed no signs of DVT. Atrial fibrillation.  Cardiac rate controlled.  Cardizem was being used while on the rehab unit.  Nonspecific chest pain.  Troponins negative. In regard to recent diagnosis of  left lung squamous cell carcinoma. Subsequent chemotherapy radiation therapy.  He was followed by Radiation/Oncology Services.  Most recent MRI of the brain showed suggestion of possible metastasis.  The patient with progressive decline in functional mobility and contacts made in regard to hospice care, palliative therapies.  It was a recommendation that the patient was not at candidate for any further radiation at this time.  Palliative Care follow-up recommendations were for discharge to hospice care. However patient's medical condition continued to decline rapidly and it was felt patient should remain on the unit as he was quite medically compromised.  Wife was in full agreement with this.  Medication simplified.  He would remain on Keppra.  The morphine was added for pain control. At approximately 8:07 PM 07-21-2015 patient passed away peacefully and was pronounced.       Lauraine Rinne, P.A.     DA/MEDQ  D:  07/21/15  T:  07/21/2015  Job:  [id number]  cc:   Misty Stanley, M.D. Pramod P. Leonie Man, MD [place]

## 2015-08-10 NOTE — Plan of Care (Signed)
Problem: RH SKIN INTEGRITY Goal: RH STG SKIN FREE OF INFECTION/BREAKDOWN Skin free of infection/breakdown with minimal assistance  Outcome: Completed/Met Date Met:  07/27/15 Change in patient's condition. Comfort/palliative care given

## 2015-08-10 NOTE — Progress Notes (Signed)
   2015/08/13 1400  Clinical Encounter Type  Visited With Patient and family together  Visit Type Patient actively dying  Referral From Family  Spiritual Encounters  Spiritual Needs Prayer;Emotional  Stress Factors  Family Stress Factors Loss  Chaplain responded to call for Dir of Sp Care asking someone to go to room to pray with family. Patient is [employer] and has recently seen a priest, said confession. Wife indicated that while it would be nice to have a priest come by again, she was happy with my presence and did not need priest called at this time. Chaplain prayed with wife, daughter, and patient and offered comfort to family. Told them to call if they needed [place] again.  Lainie Daubert, Chaplain

## 2015-08-10 NOTE — Patient Care Conference (Signed)
Inpatient RehabilitationTeam Conference and Plan of Care Update Date: 22-Jul-2015   Time: 10:30 AM    Patient Name: Ivan Beasley      Medical Record Number: [id number]  Date of Birth: January 03, 1941 Sex: Male         Room/Bed: 4W11C/4W11C-01 Payor Info: Payor: HUMANA MEDICARE / Plan: HUMANA MEDICARE HMO / Product Type: *No Product type* /    Admitting Diagnosis: R-ICH  Admit Date/Time:  07/02/2015  4:45 PM Admission Comments: No comment available   Primary Diagnosis:  ICH (intracerebral hemorrhage) (HCC) Principal Problem: ICH (intracerebral hemorrhage) ([place])  Patient Active Problem List   Diagnosis Date Noted  . Labile blood pressure   . Lethargic   . Nontraumatic subcortical hemorrhage of left cerebral hemisphere ([place])   . Elevated blood pressure   . Notalgia   . Palliative care encounter   . Right flaccid hemiparesis ([place])   . Acute blood loss anemia   . Acute confusional state   . Mental confusion   . Type 2 diabetes mellitus with peripheral neuropathy (HCC)   . Confusion   . Adjustment disorder with mixed anxiety and depressed mood   . Chronic anticoagulation   . Malignant neoplasm of left lung ([place])   . Cerebrovascular accident (CVA) due to embolism of left middle cerebral artery ([place])   . SAH (subarachnoid hemorrhage) ([place]) 06/20/2015  . Coagulopathy ([place]) - eliquis related 06/28/2015  . Brain metastases ([place]), possible 06/17/2015  . Seizures ([place]), possible 06/17/2015  . Asystole ([place]) 06/13/2015  . Hypophosphatemia 07/07/2015  . Hypomagnesemia 06/29/2015  . CKD (chronic kidney disease), stage III 06/29/2015  . Anemia 06/20/2015  . Right hemiparesis ([place]) 07/07/2015  . Cough   . Idiopathic hypotension   . Lung cancer ([place])   . ICH (intracerebral hemorrhage) ([place])- L frontal ICH 06/13/2015  . Paroxysmal atrial fibrillation ([place]) 05/13/2015  . PNA (pneumonia) 05/13/2015  . HCAP (healthcare-associated pneumonia)   . Pneumonitis   . Acute respiratory failure ([place])    . Acute on chronic respiratory failure with hypoxia ([place]) 04/17/2015  . Respiratory failure ([place]) 04/17/2015  . Cancer of lung ([place]) 05/17/2014  . COPD, mild ([place]) 08/29/2013  . Cough, persistent 08/29/2013  . Mild chronic obstructive pulmonary disease ([place]) 08/29/2013  . H/O cardiac catheterization 07/29/2013  . Breath shortness 06/23/2013  . Chest pain 06/23/2013  . BP (high blood pressure) 06/20/2013  . Diabetes ([place]) 06/20/2013  . HLD (hyperlipidemia) 06/20/2013  . H/O coronary artery bypass surgery 06/20/2013  . Diabetes mellitus ([place]) 06/20/2013  . H/O total knee replacement 06/03/2013    Expected Discharge Date: Expected Discharge Date: 07/15/15  Team Members Present: Physician leading conference: Dr. Delice Lesch Social Worker Present: Alfonse Alpers, LCSW Nurse Present: Lynett Fish, RN PT Present: Kem Parkinson, PT OT Present: [name], OT SLP Present: Gunnar Fusi, SLP PPS Coordinator present : Daiva Nakayama, RN, CRRN     Current Status/Progress Goal Weekly Team Focus  Medical   Right hemiplegia secondary to left frontal ICH with adjacent SAH while on Eliquis  Comfort care  Pt and family comfort   Bowel/Bladder   incontinent of bowel & bladder. condom cath HS   cont with mod assist   timed toileting    Swallow/Nutrition/ Hydration   Dys.1 textures and nectar-thick liquids with minimal intake and full assist   supervision for least restrictive diet   family education regarding eating with risk versus optimizing safety   ADL's   max-total assist bathing and dressing at bed  level.  Max-total assist transfers with slide board, LUE flaccid with subluxation  min assist sitting balance, max assist bathing, mod assist UB dressing, total assist LB dressing  ADL retraining, sitting balance, transfers, RUE NMR and positioning, family education   Mobility   modA bed mobility, maxA slideboard transfer, +2 gait, modA sitting balance  minA overall  family education  for transfers, hoyer, positioning, bed mobility   Communication   Max assist one word responses   min assist in sentences   continue to address intelligibility,family education prior to discharge home    Safety/Cognition/ Behavioral Observations  fluctuates depending on fatigue; mod assist   min assist   continue to address attention to tasks and overall basic cognition    Pain   c/o back pain. requiring '5mg'$  oxy PRN   <2 out of 10   assess and treat qshift and PRN    Skin   skin tears to Bil wrist with tegaderm, MASD with mgp in use  no new skin breakdown/ infection   assess q shift     Rehab Goals Patient on target to meet rehab goals: No Rehab Goals Revised: pt's therapy has been discontinued due to his change in condition - pt is palliative care now *See Care Plan and progress notes for long and short-term goals.  Barriers to Discharge: Palliative care    Possible Resolutions to Barriers:  Palliative care consult    Discharge Planning/Teaching Needs:  [name] had decided to take pt home and care for him there instead of pt going to SNF.  Now, plan is for pt to go to hospice home, if he survives the night, due to rapid decline in pt's condition.  [name] has received family education and has accepted the role hospice can play in pt's care at this time.   Team Discussion:  Palliative Medicine Team has been reconsulted re: pt's change in condition and rehab team is requesting their guidance in how to best serve pt to get him comfortable.  Pt had pain with PT with rolling - he was not speaking, just moaning.  OT observed pt groaning with no one even touching him.  Pt is not able to swallow medications and has had very little intake.  Revisions to Treatment Plan:  Pt is now Palliative/Comfort care.  He has the opportunity to transfer to [facility] in [facility], if he survives the night.   Continued Need for Acute Rehabilitation Level of Care: The patient requires daily  medical management by a physician with specialized training in physical medicine and rehabilitation for the following conditions: Daily direction of a multidisciplinary physical rehabilitation program to ensure safe treatment while eliciting the highest outcome that is of practical value to the patient.: Yes Daily medical management of patient stability for increased activity during participation in an intensive rehabilitation regime.: Yes Daily analysis of laboratory values and/or radiology reports with any subsequent need for medication adjustment of medical intervention for : Cardiac problems;Neurological problems;Other  Sumayah Bearse, Silvestre Mesi 2015-08-06, 2:27 PM

## 2015-08-10 NOTE — Progress Notes (Signed)
Speech Language Pathology Discharge Summary  Patient Details  Name: Ivan Beasley MRN: [id number] Date of Birth: 10-09-1940   Clinical Impression/Discharge Summary:   Patient met no long term goals due to change in medical status.  Prior to 7/4 pt was making slow progress toward long term goals for swallowing, cognition, and communication.  Family education was ongoing with ultimate goal for pt to return home with hospice. Pt had  significant medical decline on 7/4 and was discharged to palliative care services then ultimately hospice to provide comfort care as pt was no longer medically stable for transfer home. All family education had been completed with pt's wife prior to pt's medical decline and pt expired on 07-16-15.      Ivan Beasley, Ivan Beasley 07/15/2015, 2:15 PM

## 2015-08-10 NOTE — Progress Notes (Signed)
Social Work Patient ID: Ivan Beasley, male   DOB: [birth year], 75 y.o.   MRN: [id number]   CSW met with pt's wife and her dtr several times today to offer support and to help arrange hospice care, at first at home and then at the [facility] in [place].  Pt has medically declined over the last two days and more rapidly even today.   Hulan Saas, LPN called Laurey Morale, PMT RN who stated pt is not able to be transferred currently. Plan for pt to transfer to [place] was put on hold due to pt's decline and him not being stable for ambulance transport.  CSW called Doreatha Lew, [facility] liaison, to let her know that pt is on hold for transfer.  She agreed that we will reassess tomorrow morning, if pt survives the night.  CSW spoke with Mrs. Shillingford and his step-dtr Lenna Gilford and they also agree with the plan of keeping pt here for today and reassessing tomorrow.  PMT is working on getting pt comfortable.  CSW updated PA, Marlowe Shores, Dr. Posey Pronto, Jorge Mandril (therapy supervisor) and therapy team.  CSW appreciates greatly the support given to this CSW and 4W team from Laurey Morale and PMT and the dedication of pt's bedside nurse, Scarlette Slice.  CSW called Spiritual Care office to ask for chaplain to visit and [place] priest, per pt's wife's request.  CSW will continue to follow pt and family and offer support.

## 2015-08-10 NOTE — Progress Notes (Signed)
Palliative Medicine RN: Patient now minimally responsive, agitated, picking at air, groaning with all contact. He is unable to swallow food, liquid, or medications. He barely speaks, and when he does it is nearly unintelligible. He does c/o pain when asked. Discussed pt with Dr Douglass Rivers w PMT. Due to rapid decline since Monday, prognosis is now days to weeks, but given the impressive rate of decline, the prognosis could be much shorter.   Spoke with wife Olivia Mackie. Discussed changes, decisions, goals as well as PMT concern that if we wait to d/c or move him, he may become too unstable to transfer, as he is going all the way to [place]. She verbalized understanding. Goal now is inpt hospice at [facility]. Spoke with [facility]; they have a bed and await d/c instructions (please fax to [phone]).   Wife does report that he has O2 at home with [facility] and that she needs an MD order to d/c O2 in order for [facility] to pick up; she requested help getting this done. Called [facility] and notified that pt will have hospice at d/c; they will pick up O2 (ticket [phone]).    1225 Update: Spoke with intake with [facility]. They will have liason call me; pt cannot go until they clear it.  Marjie Skiff Booker Bhatnagar, RN, BSN, [facility] 2015-07-26 12:25 PM Cell [phone] 8:00-4:00 Monday-Friday Office [phone]

## 2015-08-10 NOTE — Discharge Summary (Deleted)
NAMELEKEITH, WULF NO.:  [id number]  MEDICAL RECORD NO.:  [id number]  LOCATION:  4W11C                        FACILITY:  [facility]  PHYSICIAN:  Charlett Blake, M.D.DATE OF BIRTH:  Sep 28, 1940  DATE OF ADMISSION:  06/17/2015 DATE OF DISCHARGE:  07-28-2015                              Death summary   DISCHARGE DIAGNOSES: 1. 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 2. Sequential compression devices for deep venous thrombosis     prophylaxis. 3. Pain management. 4. Seizure prophylaxis. 5. Atrial fibrillation. 6. Recent diagnosis of left lung squamous cell carcinoma. 7. Diabetes mellitus. 8. Peripheral neuropathy. 9. Hyperlipidemia. 10.Anemia. 11.Decreased nutritional storage. 12.Hypertension.  HISTORY OF PRESENT ILLNESS:  This is a 75 year old right-handed male, history of diabetes mellitus, CAD with CABG, atrial fibrillation on Eliquis, and recent diagnosis of left lung squamous cell cancer April 2016, with subsequent chemotherapy radiation, but developed abnormal lymph node enlargement treated with immunosuppressant therapy in April 2017, of which reacted wildly to this therapy.  He is married, was independent prior to admission.  Presented on June 13, 2015, to [facility] with altered mental status, right-sided weakness.  CT of the head revealed left frontal intracerebral hemorrhage, subarachnoid hemorrhage.  His Eliquis was discontinued.  He was transferred to [facility].  MRI of the brain showed posterior left frontal lobe complex 2.5 x 2.2 x 2 cm hemorrhage lesion with marked surrounding vasogenic edema with breakthrough into the sulci with subarachnoid hemorrhage noted.  Maintained on Keppra for seizure prophylaxis.  EEG consistent with encephalopathy.  Recent echocardiogram with ejection fraction of 50% without embolus.  Lower extremity  Dopplers negative. Plan was to repeat MRI with further evaluation of possible metastasis, posterior left frontal lobe after ICH absorbed.  No current plan to resume Eliquis at this time.  Physical and occupational therapy ongoing with slow progressive gains.  The patient was admitted for a comprehensive rehab program.  PAST MEDICAL HISTORY:  See discharge diagnoses.  SOCIAL HISTORY:  Lives with wife, independent prior to admission, driving short distances.  Functional status upon admission to rehab services was max assist, transfers; max assist to roll in bed; total lift for transfers; max total assist, activities of daily living.  PHYSICAL EXAMINATION:  VITAL SIGNS:  Blood pressure 127/68, pulse 88, temperature 98, respirations 18. GENERAL:  This was an alert male, flat affect.  Oriented to person, place. LUNGS:  Decreased breath sounds, clear to auscultation. CARDIAC:  Rate irregularly irregular. ABDOMEN:  Soft, nontender.  Good bowel sounds.  Dense right-sided weakness.  REHABILITATION HOSPITAL COURSE:  The patient was admitted to inpatient rehab services with therapies initiated on a 3-hour daily basis, consisting of physical therapy, occupational therapy, speech therapy, and rehabilitation nursing.  The following issues were addressed during the patient's rehabilitation stay.  Pertaining to Mr. Deem left frontal ICH with adjacent SAH while on Eliquis, close monitoring per Neurology Services.  His Eliquis yet remained on hold.  Latest MRI of the brain on June 26, 2015, showing continued internal evolution of complex left frontal lobe hemorrhagic lesion, additional 9 x 11 x 11 mm enhancing lesion within  the anterior left parietal lobe.  No increase in blood collection.  Xarelto was initiated in place of Eliquis for history of atrial fibrillation after discussion with Neurology Services and monitored.  Bouts of anemia monitor closely while on blood thinner. Followup per  Medicine team.  Stool guaiac negative x2.  He remained on iron supplement.  Cardiac rate remained controlled.  In regard to patient's recent diagnosis of left lung squamous cell carcinoma in April 2016, he continued to be followed by [employer] as well as palliative care also being consulted, not a candidate for radiation to the head at this time even if metastasis suspected.  All issues in regard to this were discussed with wife.  MRI was completed for followup on July 12, 2015, showing progressive posterior left frontal enhancement related to infarct on June 26, 2015.  Persistent rim enhancement around the contracting posterior left frontal parenchymal hematoma which obscured the nodular enhancement seen on June 14, 2015.  Again all issues of this were discussed with patient and family.  Pain management remain ongoing with hydrocodone syrup as needed.  The patient received weekly collaborative interdisciplinary team conferences to discuss estimated length of stay, family teaching, any barriers to discharge.  The patient's functional mobility continued, so generalized decline.  He was ambulating 30 feet x2, with +2 assistance.  Very limited endurance.  He could not do a lateral scoot transfer with transfer board from wheelchair to bed with max assist, and assistance of with his wife. Transferred wheelchair to bed with a [employer] lift.  All issues in regard to patient's generalized decline recent, noted diagnosis of left lung squamous cell carcinoma.  Recommendations were made for home with hospice at family's request as well as followup for palliative medicine. All these issues had been arranged at time of discharge.  He was tolerating a dysphagia #1 nectar thick liquid diet with limited p.o. intake.  Medications to be simplified at time of discharge currently with Xanax 0.25 mg 3 times daily as needed, amiodarone 200 mg p.o. daily, Lipitor 40 mg p.o. daily, Cardizem 60 mg  p.o. b.i.d., Hycodan 5-1.5 mg syrup every 6 hours as needed, Niferex 150 mg p.o. daily, Keppra 1000 mg intravenously every 12 hours through Port-A-Cath, Glucophage 1000 mg p.o. daily and 500 mg with supper, oxycodone immediate release 5 mg every 6 hours as needed severe pain, Xarelto 50 mg p.o. daily, Protonix 40 mg p.o. daily, Carafate 1 g p.o. t.i.d., vitamin B12 1000 mcg p.o. daily.  DIET:  His diet was a dysphagia #1 nectar thick liquid.  FOLLOWUP:  The patient would follow up with Dr. Tyler Pita call for appointment; Dr. Alysia Penna as needed; Dr. Antony Contras, Neurology Services call for appointment; Dr. Brayton Layman, medical management.     Lauraine Rinne, P.A.   ______________________________ Charlett Blake, M.D.    DA/MEDQ  D:  08-03-2015  T:  07/15/2015  Job:  [id number]  cc:   Misty Stanley, M.D. Pramod P. Leonie Man, MD

## 2015-08-10 NOTE — Progress Notes (Signed)
Occupational Therapy Discharge Summary  Patient Details  Name: Ivan Beasley MRN: [id number] Date of Birth: Jul 02, 1940  Patient met no long term goals due to change in medical status, however prior to 7/4 pt was making progress towards goals of mod-max assist and family education with ultimate goal to return home was occurring.  Pt has had a significant medical decline and is discharging to palliative care to provide comfort care at this time.  All family education has been completed with pt's wife who is understanding of pt's current status and agree with current plan of care.  Reasons goals not met: Pt has had a significant medical declined and is discharging to palliative care.  Recommendation:  Pt will not benefit from further therapy at this time, plan moving forward is comfort care.  Equipment: Hospital bed  Reasons for discharge: discharge from hospital  Patient/family agrees with progress made and goals achieved: Yes   See Function Navigator for Current Functional Status.  [name] 2015-08-06, 3:33 PM

## 2015-08-10 NOTE — Progress Notes (Signed)
PROGRESS NOTE  Ivan Beasley IEP:[id number] DOB: 05-Jul-1940 DOA: 06/25/2015 PCP: Ivan Billet, MD  Brief History:  75 y.o. 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 TRH was consulted due to progressive anemia.  Previous medical record reviewed and summarized.  Assessment/Plan: Chronic blood loss anemia -Multifactorial including frequent blood draws as well as iron deficiency and low B12 -07/04/2015 iron saturation 8%, ferritin 21 -Serum B12--267 -RBC folate 1136 -CT abdomen and pelvis negative for retroperitoneal bleed -Guaiac negative -Transfused 1 unit PRBC since CIR admit--Hgb largely stable since then -Baseline hemoglobin 8-9 -Repeat CBC but expect dilutional drop from IVF -give Feraheme x 1   Dehydration -resolved -saline lock IVF  Left frontal ICH/Adjacent SAH -Occurred while on apixiban for atrial fibrillation -07/12/2015 MR brain--progressive left frontal enhancement related to infarct on 06/26/2015 -Stable on rivaroxaban--restarted 07/06/2015 -Heparin drip stopped 07/04/2015 -Due to near resolution of ICH as well as has been >2 weeks, and high risk for recurrent embolic stroke off anticoagulation and relative small size of infarct, neurology Dr. Erlinda Hong recommended on 6/20, to consider transition back to [place], wife okay with Xarelto, this can be started  Atrial fibrillation -07/13/2015--personally reviewed EKG--sinus rhythm, no ST-T wave changes -CHADsVASc score at least 6 -Continue amiodarone -Continue  diltiazem -Rivaroxaban started 07/06/2015  CKD stage III -Baseline creatinine 1.1-1.4 -check BMP  Hypertension -Stable -Continue diltiazem  Diabetes mellitus type 2 with neuropathy -Continue metformin -06/14/2015 hemoglobin A1c 6.8 -Continue NovoLog sliding scale  Coronary artery disease -History of CABG -07/13/2015--troponin negative 3  Left lung squamous cell carcinoma -Diagnosed April 2016 -Status post chemotherapy and radiation -Radiation oncology recommends repeat MRI in approximately 2 months  Seizure disorder -Continue Keppra  Hyperlipidemia -continue atorvastatin    Disposition Plan:   Per primary Family Communication: No  Family at bedside--total time 35 min; >50% spent counseling and coordinating care  Code Status: DNR  DVT Prophylaxis:  Xarelto   Procedures: As Listed in Progress Note Above  Antibiotics: None    Subjective: Patient denies fevers, chills, headache, chest pain, dyspnea, nausea, vomiting, diarrhea, abdominal pain, dysuria,   Objective: Filed Vitals:   07/13/15 0856 07/13/15 1318 07/13/15 1955 Aug 05, 2015 0550  BP: 124/60 134/47 105/83 150/64  Pulse:  95  98  Temp:  97.9 F (36.6 C)  99.3 F (37.4 C)  TempSrc:  Oral  Oral  Resp:  17  18  Weight:    90.901 kg (200 lb 6.4 oz)  SpO2:  94%  97%    Intake/Output Summary (Last 24 hours) at August 05, 2015 0718 Last data filed at 05-Aug-2015 0615  Gross per 24 hour  Intake   1950 ml  Output    425 ml  Net   1525 ml   Weight change:  Exam:   General:  Pt is alert, follows commands appropriately, not in acute distress  HEENT: No icterus, No thrush, No neck mass, [place]/AT  Cardiovascular: RRR, S1/S2, no rubs, no gallops  Respiratory: Bibasilar crackles. No wheeze. Good air movement  Abdomen: Soft/+BS, non tender, non distended, no guarding  Extremities: No edema, No lymphangitis, No petechiae, No rashes, no synovitis   Data  Reviewed: I have personally reviewed following labs  and imaging studies Basic Metabolic Panel:  Recent Labs Lab 07/07/15 1430 07/09/15 0500 07/11/15 0600  NA 136 135 138  K 5.1 4.5 4.3  CL 105 103 105  CO2 '22 22 24  '$ GLUCOSE 92 99 110*  BUN '10 12 13  '$ CREATININE 1.51* 1.36* 1.23  CALCIUM 9.8 9.6 9.2  PHOS  --   --  3.5   Liver Function Tests:  Recent Labs Lab 07/07/15 1430 07/11/15 0600  AST 20  --   ALT 16*  --   ALKPHOS 102  --   BILITOT 0.5  --   PROT 7.0  --   ALBUMIN 3.3* 2.6*    Recent Labs Lab 07/13/15 1230  LIPASE 15   No results for input(s): AMMONIA in the last 168 hours. Coagulation Profile: No results for input(s): INR, PROTIME in the last 168 hours. CBC:  Recent Labs Lab 07/08/15 0430 07/09/15 0500 07/11/15 0600  WBC  --  6.3 3.9*  NEUTROABS  --  4.3 2.3  HGB 8.9* 9.1* 8.2*  HCT 28.7* 29.4* 26.7*  MCV  --  88.0 89.0  PLT  --  216 208   Cardiac Enzymes:  Recent Labs Lab 07/13/15 0800 07/13/15 1230 07/13/15 1735  TROPONINI <0.03 <0.03 <0.03   BNP: Invalid input(s): POCBNP CBG:  Recent Labs Lab 07/13/15 0640 07/13/15 1124 07/13/15 1628 07/13/15 2105 2015-07-17 0643  GLUCAP 130* 144* 131* 123* 151*   HbA1C: No results for input(s): HGBA1C in the last 72 hours. Urine analysis:    Component Value Date/Time   COLORURINE YELLOW 07/09/2015 0505   COLORURINE Yellow 05/07/2013 1145   APPEARANCEUR CLEAR 07/09/2015 0505   APPEARANCEUR Hazy 05/07/2013 1145   LABSPEC 1.017 07/09/2015 0505   LABSPEC 1.011 05/07/2013 1145   PHURINE 6.0 07/09/2015 0505   PHURINE 5.0 05/07/2013 1145   GLUCOSEU NEGATIVE 07/09/2015 0505   GLUCOSEU 50 mg/dL 05/07/2013 1145   HGBUR NEGATIVE 07/09/2015 0505   HGBUR Negative 05/07/2013 1145   BILIRUBINUR NEGATIVE 07/09/2015 0505   BILIRUBINUR Negative 05/07/2013 1145   KETONESUR NEGATIVE 07/09/2015 0505   KETONESUR Negative 05/07/2013 1145   PROTEINUR NEGATIVE 07/09/2015 0505   PROTEINUR Negative 05/07/2013 1145   NITRITE NEGATIVE 07/09/2015 0505    NITRITE Negative 05/07/2013 1145   LEUKOCYTESUR NEGATIVE 07/09/2015 0505   LEUKOCYTESUR Negative 05/07/2013 1145   Sepsis Labs: '@LABRCNTIP'$ (procalcitonin:4,lacticidven:4) ) Recent Results (from the past 240 hour(s))  Culture, Urine     Status: Abnormal   Collection Time: 07/09/15  5:05 AM  Result Value Ref Range Status   Specimen Description URINE, CATHETERIZED  Final   Special Requests NONE  Final   Culture <10,000 COLONIES/mL INSIGNIFICANT GROWTH (A)  Final   Report Status 07/10/2015 FINAL  Final     Scheduled Meds: . amiodarone  200 mg Oral Daily  . antiseptic oral rinse  7 mL Mouth Rinse BID  . atorvastatin  40 mg Oral q1800  . diclofenac sodium  2 g Topical QID  . diltiazem  60 mg Oral BID  . insulin aspart  0-24 Units Subcutaneous TID AC & HS  . iron polysaccharides  150 mg Oral Daily  . lactose free nutrition  237 mL Oral TID WC  . levETIRAcetam  1,000 mg Intravenous Q12H  . metFORMIN  1,000 mg Oral Q breakfast  . metFORMIN  500 mg Oral Q supper  . pantoprazole sodium  40 mg Oral Daily  . rivaroxaban  15 mg Oral Q  supper  . sucralfate  1 g Oral TID WC & HS  . vitamin B-12  1,000 mcg Oral Daily   Continuous Infusions: . sodium chloride 50 mL/hr at 18-Jul-2015 1610    Procedures/Studies: Ct Abdomen Pelvis Wo Contrast  07/04/2015  CLINICAL DATA:  Anemia, history coronary disease post MI, diabetes mellitus, former smoker, stage III chronic kidney disease, LEFT lung cancer, atrial fibrillation EXAM: CT ABDOMEN AND PELVIS WITHOUT CONTRAST TECHNIQUE: Multidetector CT imaging of the abdomen and pelvis was performed following the standard protocol without IV contrast. Sagittal and coronal MPR images reconstructed from axial data set. No oral contrast was administered. COMPARISON:  None. CT from PET-CT exam 05/03/2015 FINDINGS: Lower chest:  Bibasilar atelectasis. Hepatobiliary: Cholelithiasis. Beam hardening artifacts from patient's arms traverse liver, no gross hepatic abnormality  seen. Pancreas: Atrophic, otherwise unremarkable Spleen: Traversing artifacts, grossly normal appearance Adrenals/Urinary Tract: Normal appearing adrenal glands. Cyst at inferior pole LEFT kidney again identified 3.4 x 3.6 cm image 49. Kidneys otherwise unremarkable without additional mass or hydronephrosis. Single nondilated ureters bilaterally. Normal appearing well distended urinary bladder. Stomach/Bowel: Normal appendix. Retained dense contrast within colon from recent swallowing study. Stomach and bowel loops otherwise normal appearance for technique. Vascular/Lymphatic: Atherosclerotic calcifications aorta, iliac, and femoral arteries. Few coronary arterial calcifications. Low-attenuation of circulating blood consistent with history of anemia. No adenopathy. Reproductive: N/A Other: Small BILATERAL inguinal hernias containing fat. No free air or free fluid. Musculoskeletal: Degenerative disc disease changes L4-L5. No acute osseous findings. IMPRESSION: LEFT renal cyst. Cholelithiasis. Diffuse colonic diverticulosis without gross evidence acute diverticulitis. Small BILATERAL inguinal hernias containing fat. Aortic atherosclerosis. Electronically Signed   By: Lavonia Dana M.D.   On: 07/04/2015 12:55   Dg Chest 1 View  07/08/2015  CLINICAL DATA:  Altered mental status for 1 day. History of lung carcinoma EXAM: CHEST 1 VIEW COMPARISON:  Chest radiograph July 06, 2015; PET-CT May 03, 2015 FINDINGS: There is again noted opacity in the left mid lung region extending to the left hilum, likely due to mass with postobstructive pneumonitis. Right lung clear. Heart is upper normal in size with pulmonary vascularity within normal limits. Calcification noted in the aortic arch. Port-A-Cath tip in superior vena cava. No pneumothorax. No bone lesions evident. IMPRESSION: Stable left midlung opacity, likely due to mass with postobstructive pneumonitis. Lungs elsewhere clear. No new opacity. Stable cardiac silhouette.  Aortic atherosclerosis. Electronically Signed   By: Lowella Grip III M.D.   On: 07/08/2015 19:57   Dg Chest 2 View  07/02/2015  CLINICAL DATA:  Persistent cough, COPD, lung malignancy. Patient cyst has sustained an intracranial hemorrhage with right hemi [place] is EXAM: CHEST  2 VIEW COMPARISON:  Chest x-ray of June 26, 2015 FINDINGS: The right lung is adequately inflated and exhibits increased density in the infrahilar region. There is some overlap here of the Port-A-Cath reservoir. On the left there is persistent increased density in the perihilar region extending toward the lateral pleural surface. The heart is top-normal in size. There are post CABG changes. Three upper sternal wires are chronically broken. There is calcification in the wall of the aortic arch. There is no pleural effusion or pneumothorax. The Port-A-Cath appliance tip projects over the midportion of the SVC. IMPRESSION: 1. Subtle increased density at the right lung base suggests subsegmental atelectasis. Stable parenchymal density in the left mid lung and left perihilar region. 2. Stable post CABG changes.  No evidence of CHF. Electronically Signed   By: Leafy Motsinger  [place] M.D.   On:  07/02/2015 09:40   Dg Chest 2 View  06/26/2015  CLINICAL DATA:  C/o chronic cough. 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 Rales heard in right lower lung. 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 EXAM: CHEST  2 VIEW COMPARISON:  06/25/2015 FINDINGS: There stable changes from prior CABG surgery. Cardiac silhouette is mildly enlarged. Focal irregular opacity in the posterior perihilar region of the left lung is stable. There are no new areas of lung opacification. No pleural effusion or pneumothorax. Right anterior chest wall Port-A-Cath is stable. Bony thorax is intact. IMPRESSION: 1. No acute cardiopulmonary disease. 2. No change from the prior study. Persistent focal opacity in the perihilar region of the left  lung consistent with the reported squamous cell lung carcinoma. Electronically Signed   By: Lajean Manes M.D.   On: 06/26/2015 16:36   Dg Chest 2 View  06/25/2015  CLINICAL DATA:  Cough, left-sided weakness EXAM: CHEST  2 VIEW COMPARISON:  07/07/2015 FINDINGS: Cardiomediastinal silhouette is stable. Right IJ Port-A-Cath is unchanged in position. Persistent left upper lobe consolidation. Bony thorax is unremarkable. IMPRESSION: No significant change. Persistent left upper lobe consolidation. Stable right IJ Port-A-Cath position. Electronically Signed   By: Lahoma Crocker M.D.   On: 06/25/2015 16:54   Ct Head Wo Contrast  06/26/2015  CLINICAL DATA:  Slurred speech. EXAM: CT HEAD WITHOUT CONTRAST TECHNIQUE: Contiguous axial images were obtained from the base of the skull through the vertex without intravenous contrast. COMPARISON:  MRI of June 14, 2015.  CT scan of June 13, 2015. FINDINGS: Bony calvarium appears intact.  Sphenoid sinusitis is noted. Mild diffuse cortical atrophy is noted. There is continued presence of left superior parietal intraparenchymal hemorrhage which now measures 21 x 11 mm and is slightly decreased in size compared to prior exam. Subarachnoid hemorrhage has resolved. There is continued surrounding vasogenic edema. No significant midline shift is noted. Ventricular size is unremarkable. IMPRESSION: Mild diffuse cortical atrophy. Continued presence of left parietal intraparenchymal hemorrhage with surrounding vasogenic edema. This is improved compared to prior exam. Subarachnoid hemorrhage is no longer present. Electronically Signed   By: Marijo Conception, M.D.   On: 06/26/2015 14:29   Mr Jodene Nam Head Wo Contrast  06/15/2015  ADDENDUM REPORT: 06/15/2015 08:11 ADDENDUM: With the rounded contour of the enhancing component of the left frontal lobe hematoma, follow-up until clearance recommended to exclude underlying vascular abnormality. Electronically Signed   By: Genia Del M.D.   On: 06/15/2015  08:11  06/15/2015  CLINICAL DATA:  75 year old diabetic male with history of lung cancer. Right-sided weakness. Subsequent encounter. EXAM: MRI HEAD WITHOUT CONTRAST MRA HEAD WITHOUT CONTRAST TECHNIQUE: Multiplanar, multiecho pulse sequences of the brain and surrounding structures were obtained without intravenous contrast. Angiographic images of the head were obtained using MRA technique without contrast. COMPARISON:  06/13/2015 head CT.  07/28/2014 brain MR. FINDINGS: MRI HEAD FINDINGS Posterior left frontal lobe complex 2.5 x 2.2 x 2 cm hemorrhagic lesion with marked surrounding vasogenic edema with breakthrough into sulci with subarachnoid hemorrhage noted. Patient's history of lung cancer in addition to 6 mm rounded area of enhanced along the posterior margin of this hemorrhage suggests that this is most likely is related to a hemorrhagic metastatic lesion which has bled. Continued MR surveillance as hemorrhage clears is recommended. There may be a a second enhancing lesion within the anterior left parietal lobe with surrounding vasogenic edema. Venous infarct can have a similar appearance. The major dural sinuses  appear patent. No acute thrombotic infarct separate from the above described findings. Remote small left parietal lobe, left frontal lobe, posterior left opercular and tiny right thalamic infarct. Mild small vessel disease changes. Moderate global atrophy without hydrocephalus. Opacification left sphenoid sinus with air-fluid level suggesting acute sinusitis. Mild mucosal thickening ethmoid sinus air cells and minimal mucosal thickening frontal sinuses. Bilateral mastoid air cell and middle ear opacification greater on the left without obstructing lesion of the eustachian tube noted. Post lens replacement without acute orbital abnormality noted. MRA HEAD FINDINGS MR angiogram does not incorporate the left frontal lobe hemorrhage. Mild narrowing supraclinoid aspect internal carotid artery bilaterally  with irregularity more notable on the left. Small infundibulum on the left posterior communicating artery level. Anterior circulation without medium or large size vessel significant stenosis or occlusion. Middle cerebral artery branch vessel irregularity bilaterally. Right vertebral artery ends in a posterior inferior cerebellar artery distribution. No significant narrowing left vertebral artery. Moderate narrowing portions of the left posterior inferior cerebellar artery. Ectatic basilar artery without high-grade stenosis. Nonvisualized anterior inferior cerebellar arteries. Small left superior cerebellar artery. Posterior cerebral artery distal branch vessel narrowing bilaterally. IMPRESSION: MRI HEAD Posterior left frontal lobe complex 2.5 x 2.2 x 2 cm hemorrhagic lesion with marked surrounding vasogenic edema with breakthrough into sulci with subarachnoid hemorrhage noted. Patient's history of lung cancer in addition to 6 mm rounded area of enhanced along the posterior margin of this hemorrhage suggests that this is most likely is related to a hemorrhagic metastatic lesion which has bled. Continued MR surveillance as hemorrhage clears is recommended. There may be a a second enhancing lesion within the anterior left parietal lobe with surrounding vasogenic edema. Venous infarct can have a similar appearance. The major dural sinuses appear patent. Opacification left sphenoid sinus with air-fluid level suggesting acute sinusitis. Bilateral mastoid air cell and middle ear opacification greater on the left without obstructing lesion of the eustachian tube noted. MRA HEAD MR angiogram does not incorporate the left frontal lobe hemorrhage. Mild narrowing supraclinoid aspect internal carotid artery bilaterally with irregularity more notable on the left. Small infundibulum on the left posterior communicating artery level. Anterior circulation without medium or large size vessel significant stenosis or occlusion. Middle  cerebral artery branch vessel irregularity bilaterally. Right vertebral artery ends in a posterior inferior cerebellar artery distribution. No significant narrowing left vertebral artery. Moderate narrowing portions of the left posterior inferior cerebellar artery. Ectatic basilar artery without high-grade stenosis. Nonvisualized anterior inferior cerebellar arteries. Small left superior cerebellar artery. Posterior cerebral artery distal branch vessel narrowing bilaterally. Electronically Signed: By: Genia Del M.D. On: 06/14/2015 19:34   [name] JW Contrast  07/12/2015  CLINICAL DATA:  Follow-up possible brain metastasis. EXAM: MRI HEAD WITHOUT AND WITH CONTRAST TECHNIQUE: Multiplanar, multiecho pulse sequences of the brain and surrounding structures were obtained without and with intravenous contrast. CONTRAST:  49m MULTIHANCE GADOBENATE DIMEGLUMINE 529 MG/ML IV SOLN COMPARISON:  Brain MRI 06/14/2015 and 06/26/2015 FINDINGS: Calvarium and upper cervical spine: No focal marrow signal abnormality. Orbits: Bilateral cataract resection. Sinuses and Mastoids: Persistent bilateral mastoid effusion. Sphenoid sinusitis has improved. Brain: Parenchymal hemorrhage subcortical posterior left frontal has contracted. There is progressive hemosiderin staining with rim of methemoglobin along the cavity wall. On 5:20 the hematoma measures 23 x 14 mm today. Small regional subarachnoid hemorrhage is diminished. Persistent restricted diffusion subcortical below the hematoma cavity. Cortical restricted diffusion has diminished from prior. Even when accounting for methemoglobin rim, there is enhancement around the hematoma cavity,  expected, which obscures the nodular enhancement seen on the initial scan. There is new cortical enhancement along the previously seen acute infarct. A nodular area of frontal parietal cortex enhancement is stable in size compared to initial scan 06/14/2015, 10 mm in diameter on 11:43. Separate  from this area, there is no evidence of new enhancement. Postcontrast and other sequences are limited by motion, which could obscure subtle lesion. Stable lateral ventriculomegaly with prominent flow void and cerebral aqua duct. Still, communicating hydrocephalus is considered unlikely given the degree of generalized volume loss the lack third ventricular dilatation. No interval infarct.  No evidence of major vessel occlusion. IMPRESSION: 1. Progressive posterior left frontal enhancement related to infarct seen 06/26/2015. Persistent rim enhancement around the contracting posterior left frontal parenchymal hematoma which obscures the nodular enhancement seen on initial scan 06/14/2015. 10 mm cortical enhancement posterior to the hematoma is stable since initial scan. This expected subacute infarct enhancement and hematoma cavity enhancement could obscure metastatic enhancement. Given the evolution of findings, it is possible the enhancement on the initial scan was also subacute infarct, with superimposed hemorrhagic conversion. Continued imaging follow-up is recommended. Post infarct enhancement can persist for up to 3 months, but should be diminishing by 6 weeks. 2. Motion degraded exam without enhancement other than above. 3. Bilateral mastoiditis.  Improved sinusitis. Electronically Signed   By: Monte Fantasia M.D.   On: 07/12/2015 14:54   [name] FT Contrast  06/26/2015  CLINICAL DATA:  Initial evaluation for progressively garbled and slurred speech, with increased right-sided facial droop. EXAM: MRI HEAD WITHOUT AND WITH CONTRAST TECHNIQUE: Multiplanar, multiecho pulse sequences of the brain and surrounding structures were obtained without and with intravenous contrast. CONTRAST:  78m MULTIHANCE GADOBENATE DIMEGLUMINE 529 MG/ML IV SOLN COMPARISON:  Prior CT from earlier the same day as well as previous MRI from 06/14/2015. FINDINGS: Generalized cerebral atrophy with minimal chronic small vessel ischemic  changes again noted, stable. Small remote lacunar infarct within the right thalamus. Previously identified posterior left frontal lobe complex hemorrhagic lesion again seen, overall likely decreased in size from previous. Lesion measures 13 x 21 x 16 mm on T2 weighted sequences. Surrounding vasogenic edema is similar. Breakthrough subarachnoid hemorrhage within the adjacent cortical sulci again seen, improved. There is increased peripheral enhancement about the margin of the hemorrhagic lesion, likely related to the evolving hematoma. Previously noted small hemorrhagic nodule is not as clearly delineated on this exam. Note again made of a second possible enhancing lesion within the anterior left parietal lobe along the dura, measuring approximately 9 x 11 x 11 mm. Surrounding vasogenic edema. No other new enhancing lesions. Immediately lateral and inferior to the dominant hemorrhagic lesion. There is a focus of abnormal restricted diffusion involving the cortical gray matter and underlying white matter of the posterior left frontal region, compatible with acute ischemic infarct (series 4, image 35). Associated signal loss on ADC map. This appears to be more vascular distribution rather than seizure related. Additional small sub cm focus of restricted diffusion more posteriorly within the left centrum semi ovale. No significant mass effect. Area of infarction closely approximates the hemorrhagic lesion can subarachnoid hemorrhage, but does not appear to be hemorrhagic in of itself. No other acute infarct. Gray-white matter differentiation otherwise maintained. Major intracranial vascular flow voids are preserved. No midline shift. No hydrocephalus. No extra-axial fluid collection. Again, major dural sinuses appear to be grossly patent. Craniocervical junction within normal limits. Visualized upper cervical spine unremarkable. Pituitary gland within normal limits.  No acute abnormality about the globes and orbits.  Patient is status post lens extraction bilaterally. Mucosal thickening with fluid level within the left sphenoid sinus, similar to prior. Mild scattered opacity within the anterior ethmoidal air cells. Bilateral mastoid effusions present. Visualize bone marrow within normal limits. Scalp soft tissues demonstrate no acute abnormality. IMPRESSION: 1. Acute ischemic left MCA territory infarct adjacent to the posterior left frontal hemorrhagic lesion. Vasospasm as an underlying etiology is suspected given the adjacent complex hemorrhagic lesion and subarachnoid hemorrhage. 2. Continued interval evolution of complex left frontal lobe hemorrhagic lesion with similar vasogenic edema. Associated breakthrough subarachnoid hemorrhage is improved. This lesion now demonstrates peripheral enhancement, which would be expected with an evolving hematoma. Previously noted small nodular focus of enhancement not well delineated on this exam. Again, continued MR surveillance at the hemorrhage clears is recommended to ensure no underlying lesion is present. 3. Additional 9 x 11 x 11 mm enhancing lesion within the anterior left parietal lobe, similar to prior. Attention at follow-up recommended. 4. Acute left sphenoid sinus disease with bilateral mastoid effusions, similar to prior. Electronically Signed   By: Jeannine Boga M.D.   On: 06/26/2015 23:30   [name] VV Contrast  06/15/2015  ADDENDUM REPORT: 06/15/2015 08:11 ADDENDUM: With the rounded contour of the enhancing component of the left frontal lobe hematoma, follow-up until clearance recommended to exclude underlying vascular abnormality. Electronically Signed   By: Genia Del M.D.   On: 06/15/2015 08:11  06/15/2015  CLINICAL DATA:  75 year old diabetic male with history of lung cancer. Right-sided weakness. Subsequent encounter. EXAM: MRI HEAD WITHOUT CONTRAST MRA HEAD WITHOUT CONTRAST TECHNIQUE: Multiplanar, multiecho pulse sequences of the brain and surrounding  structures were obtained without intravenous contrast. Angiographic images of the head were obtained using MRA technique without contrast. COMPARISON:  06/13/2015 head CT.  07/28/2014 brain MR. FINDINGS: MRI HEAD FINDINGS Posterior left frontal lobe complex 2.5 x 2.2 x 2 cm hemorrhagic lesion with marked surrounding vasogenic edema with breakthrough into sulci with subarachnoid hemorrhage noted. Patient's history of lung cancer in addition to 6 mm rounded area of enhanced along the posterior margin of this hemorrhage suggests that this is most likely is related to a hemorrhagic metastatic lesion which has bled. Continued MR surveillance as hemorrhage clears is recommended. There may be a a second enhancing lesion within the anterior left parietal lobe with surrounding vasogenic edema. Venous infarct can have a similar appearance. The major dural sinuses appear patent. No acute thrombotic infarct separate from the above described findings. Remote small left parietal lobe, left frontal lobe, posterior left opercular and tiny right thalamic infarct. Mild small vessel disease changes. Moderate global atrophy without hydrocephalus. Opacification left sphenoid sinus with air-fluid level suggesting acute sinusitis. Mild mucosal thickening ethmoid sinus air cells and minimal mucosal thickening frontal sinuses. Bilateral mastoid air cell and middle ear opacification greater on the left without obstructing lesion of the eustachian tube noted. Post lens replacement without acute orbital abnormality noted. MRA HEAD FINDINGS MR angiogram does not incorporate the left frontal lobe hemorrhage. Mild narrowing supraclinoid aspect internal carotid artery bilaterally with irregularity more notable on the left. Small infundibulum on the left posterior communicating artery level. Anterior circulation without medium or large size vessel significant stenosis or occlusion. Middle cerebral artery branch vessel irregularity bilaterally.  Right vertebral artery ends in a posterior inferior cerebellar artery distribution. No significant narrowing left vertebral artery. Moderate narrowing portions of the left posterior inferior cerebellar artery. Ectatic basilar artery without  high-grade stenosis. Nonvisualized anterior inferior cerebellar arteries. Small left superior cerebellar artery. Posterior cerebral artery distal branch vessel narrowing bilaterally. IMPRESSION: MRI HEAD Posterior left frontal lobe complex 2.5 x 2.2 x 2 cm hemorrhagic lesion with marked surrounding vasogenic edema with breakthrough into sulci with subarachnoid hemorrhage noted. Patient's history of lung cancer in addition to 6 mm rounded area of enhanced along the posterior margin of this hemorrhage suggests that this is most likely is related to a hemorrhagic metastatic lesion which has bled. Continued MR surveillance as hemorrhage clears is recommended. There may be a a second enhancing lesion within the anterior left parietal lobe with surrounding vasogenic edema. Venous infarct can have a similar appearance. The major dural sinuses appear patent. Opacification left sphenoid sinus with air-fluid level suggesting acute sinusitis. Bilateral mastoid air cell and middle ear opacification greater on the left without obstructing lesion of the eustachian tube noted. MRA HEAD MR angiogram does not incorporate the left frontal lobe hemorrhage. Mild narrowing supraclinoid aspect internal carotid artery bilaterally with irregularity more notable on the left. Small infundibulum on the left posterior communicating artery level. Anterior circulation without medium or large size vessel significant stenosis or occlusion. Middle cerebral artery branch vessel irregularity bilaterally. Right vertebral artery ends in a posterior inferior cerebellar artery distribution. No significant narrowing left vertebral artery. Moderate narrowing portions of the left posterior inferior cerebellar artery.  Ectatic basilar artery without high-grade stenosis. Nonvisualized anterior inferior cerebellar arteries. Small left superior cerebellar artery. Posterior cerebral artery distal branch vessel narrowing bilaterally. Electronically Signed: By: Genia Del M.D. On: 06/14/2015 19:34   Dg Chest Port 1 View  07/06/2015  CLINICAL DATA:  COUGH.HX MI,CA EXAM: PORTABLE CHEST 1 VIEW COMPARISON:  07/02/2015 FINDINGS: Heart size is enlarged. Right-sided Port-A-Cath tip to the level of the lower superior vena cava. There is patchy density in the left upper lobe, stable compared with prior study. Mild interstitial edema is noted. IMPRESSION: 1. Persistent left upper lobe density. 2. Mild interstitial edema. Electronically Signed   By: Nolon Nations M.D.   On: 07/06/2015 14:09   Dg Chest Port 1 View  07/05/2015  CLINICAL DATA:  Cough and congestion. EXAM: PORTABLE CHEST 1 VIEW COMPARISON:  June 18, 2015 FINDINGS: There is persistent airspace consolidation in the left upper lobe with volume loss. Lungs elsewhere clear. Heart is upper normal in size with pulmonary vascularity within normal limits. Port-A-Cath tip is in the superior vena cava near the cavoatrial junction, stable. No pneumothorax. No adenopathy evident. Patient is status post coronary artery bypass grafting. IMPRESSION: Persistent left upper lobe airspace consolidation with volume loss. No change from 3 days prior. Stable cardiac silhouette. Electronically Signed   By: Lowella Grip III M.D.   On: 06/18/2015 07:25   Dg Chest Port 1 View  06/18/2015  CLINICAL DATA:  Cough EXAM: PORTABLE CHEST 1 VIEW COMPARISON:  06/16/2015 FINDINGS: Stable right jugular Port-A-Cath. Low volumes with bibasilar atelectasis. Left mid lung dense pulmonary opacity is stable. No pneumothorax. IMPRESSION: Stable left mid lung dense pulmonary opacity. Bibasilar atelectasis. Electronically Signed   By: Marybelle Killings M.D.   On: 06/18/2015 15:16   Dg Chest Port 1 View  06/16/2015   CLINICAL DATA:  75 year old male with lung cancer. No chest complaint at this time. EXAM: PORTABLE CHEST 1 VIEW COMPARISON:  Chest radiograph dated 06/15/2015 FINDINGS: There has been interval removal of the endotracheal and enteric tube. The stable area of density is again noted in the left mid lung field. Multiple  surgical clips arms in this area. No new consolidation identified. Overall there is improved aeration of the left lower lung field since the prior study. The right lung is clear. There is no pleural effusion or pneumothorax. Stable cardiac silhouette. Median sternotomy wires and CABG vascular clips noted. Right pectoral Port-A-Cath with tip in stable positioning. No acute osseous pathology. IMPRESSION: Interval removal of the endotracheal and enteric tube. Stable appearing density in the left mid lung field with overall improvement of the aeration of the left lung. Electronically Signed   By: Anner Crete M.D.   On: 06/16/2015 19:54   Dg Chest Port 1 View  06/15/2015  CLINICAL DATA:  Acute respiratory failure EXAM: PORTABLE CHEST 1 VIEW COMPARISON:  Portable chest x-ray of 06/14/2015 FINDINGS: Aeration of the lungs has improved somewhat. The tip of the endotracheal tube appears to be approximately 3.8 cm above the carina. Opacity in the left perihilar and left mid lung is unchanged. Cardiomegaly is stable. Right sided Port-A-Cath is unchanged in position, and right IJ central venous line is unchanged. IMPRESSION: Slightly better aeration. Endotracheal tube tip 3.8 cm above the carina. Electronically Signed   By: Ivar Drape M.D.   On: 06/15/2015 08:05   Dg Chest Port 1 View  06/14/2015  CLINICAL DATA:  Respiratory failure, history of coronary artery disease, GERD, diabetes, lung cancer EXAM: PORTABLE CHEST 1 VIEW COMPARISON:  06/13/2015 FINDINGS: Cardiomediastinal silhouette is stable. Status post median sternotomy. Persistent opacity left hilum and left perihilar region. Post radiation fibrosis  left upper lobe again noted. Left basilar atelectasis. Stable endotracheal and NG tube position. Right IJ Port-A-Cath is unchanged in position. Stable right IJ central line. No pneumothorax. No pulmonary edema. IMPRESSION: Stable support apparatus. Persistent opacity left hilum and left perihilar region. Postradiation fibrosis in left upper lobe again noted. Left basilar atelectasis. No pulmonary edema. No pneumothorax. Status post median sternotomy. Electronically Signed   By: Lahoma Crocker M.D.   On: 06/14/2015 08:15   Dg Swallowing Func-speech Pathology  06/29/2015  Objective Swallowing Evaluation: Type of Study: MBS-Modified Barium Swallow Study Patient Details Name: Ivan Beasley MRN: [id number] Date of Birth: 1940/06/05 Today's Date: 06/29/2015 Time: SLP Start Time (ACUTE ONLY): 0910-SLP Stop Time (ACUTE ONLY): 0930 SLP Time Calculation (min) (ACUTE ONLY): 20 min Past Medical History: Past Medical History Diagnosis Date . Coronary artery disease  . Pneumonia 2000 . Arthritis  . 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 Bruises easily  . GERD (gastroesophageal reflux disease)    takes Omeprazole daily as needed for stomach pain . Blood transfusion 2001 . Diabetes mellitus    takes Metformin daily; . Impaired hearing    bil hearing aide . Macular degeneration    being watched for this but hasn't been "completely" diagnosed . Myocardial infarction ([place]) 2001 . Cancer [facility])  Past Surgical History: Past Surgical History Procedure Laterality Date . Coronary artery bypass graft  2001   4 vessels . Cardiac catheterization  2001 . Tonsillectomy     as a child . Colonoscopy   . Cataract extraction     bilateral . Lumbar laminectomy/decompression microdiscectomy  12/19/2010   Procedure: LUMBAR LAMINECTOMY/DECOMPRESSION MICRODISCECTOMY;  Surgeon: Elaina Hoops;  Location: [place] NEURO ORS;  Service: Neurosurgery;  Laterality: N/A;  Lumbar three-four, four-five decompressive lumbar laminectomy . Joint replacement  right tkr  . Eye surgery     cataracts . Back surgery     l4 5 . Portacath placement N/A 05/20/2014  Procedure: INSERTION PORT-A-CATH;  Surgeon: Nestor Lewandowsky, MD;  Location: [facility] ORS;  Service: General;  Laterality: N/A; HPI: 75 y.o. male patient with PMH: GERD, pna, CAD, DM, MI, cancer and chronic admitted with right-sided hemi-plegia. CT of the head revealed left frontal intracerebral hemorrhage, with subarachnoid hemorrhage as well adjacent to it. Intubated approximately 24-30 hours (extubated 6/6). 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  Objective swallow study ordered to determine safest diet consistencies due to change in function.  No Data Recorded Assessment / Plan / Recommendation CHL IP CLINICAL IMPRESSIONS 06/29/2015 Therapy Diagnosis Moderate oral dysphagia, Moderate pharyngeal dysphagia  Clinical Impression Pt presents with a moderate oropharyngeal dysphagia with both sensory and motor components.  As mentioned on bedside swallow evaluation, pt presents with right sided labial, lingual, and buccal weakness which impacts his ability to contain and transit boluses effectively.  This in combination with decreased pharyngeal sensation result in premature spillage of materials into the pharynx with swallow response most consistently triggered at the pyriforms.  Delay in swallow response resulted in aspiration of thin liquids with delayed sensation.  Congested cough which occurred several seconds after aspiration event was ineffective for clearing aspirates from the airway.  Deep penetration was noted x1 over multiple boluses of nectar thick liquids which pt was able to sense and clear from the airway before materials passed below the vocal cords.  Poor base of tongue strength and decreased hyolaryngeal excursion also contributed to moderate pharyngeal residuals post swallow (vallecular residue > pyriforms).  Residue cleared to minimal-mild with cues for extra  swallows. Recommend that pt initiate a Dys 1 diet with nectar thick liquids and full supervision for use of swallowing precautions.   Prognosis for advancement good with SLP intervention for pharyngeal strengthening exercises and management of safe diet progression.   Impact on safety and function Moderate aspiration risk     Prognosis 06/29/2015 Prognosis for Safe Diet Advancement Good Barriers to Reach Goals -- Barriers/Prognosis Comment -- CHL IP DIET RECOMMENDATION 06/29/2015 SLP Diet Recommendations Dysphagia 1 (Puree) solids;Nectar thick liquid Liquid Administration via Cup Medication Administration Crushed with puree Compensations Slow rate;Small sips/bites;Clear throat intermittently;Multiple dry swallows after each bite/sip Postural Changes --   No flowsheet data found.         CHL IP ORAL PHASE 06/29/2015 Oral Phase Impaired Oral - Pudding Teaspoon -- Oral - Pudding Cup -- Oral - Honey Teaspoon -- Oral - Honey Cup -- Oral - Nectar Teaspoon Weak lingual manipulation;Right anterior bolus loss;Reduced posterior propulsion;Lingual/palatal residue;Delayed oral transit;Decreased bolus cohesion;Premature spillage Oral - Nectar Cup Right anterior bolus loss;Weak lingual manipulation;Lingual/palatal residue;Delayed oral transit;Decreased bolus cohesion;Premature spillage;Reduced posterior propulsion Oral - Nectar Straw Weak lingual manipulation;Lingual/palatal residue;Decreased bolus cohesion;Premature spillage Oral - Thin Teaspoon Right anterior bolus loss;Weak lingual manipulation;Reduced posterior propulsion;Lingual/palatal residue;Premature spillage;Delayed oral transit;Decreased bolus cohesion Oral - Thin Cup Weak lingual manipulation;Premature spillage;Lingual/palatal residue;Reduced posterior propulsion;Right anterior bolus loss;Delayed oral transit;Decreased bolus cohesion Oral - Thin Straw -- Oral - Puree Right anterior bolus loss;Weak lingual manipulation;Reduced posterior propulsion;Lingual/palatal  residue;Delayed oral transit;Decreased bolus cohesion;Premature spillage Oral - Mech Soft -- Oral - Regular -- Oral - Multi-Consistency -- Oral - Pill -- Oral Phase - Comment --  CHL IP PHARYNGEAL PHASE 06/29/2015 Pharyngeal Phase Impaired Pharyngeal- Pudding Teaspoon -- Pharyngeal -- Pharyngeal- Pudding Cup -- Pharyngeal -- Pharyngeal- Honey Teaspoon -- Pharyngeal -- Pharyngeal- Honey Cup -- Pharyngeal -- Pharyngeal- Nectar Teaspoon Delayed swallow initiation-vallecula;Reduced anterior laryngeal mobility;Reduced laryngeal elevation;Reduced  tongue base retraction;Reduced airway/laryngeal closure;Pharyngeal residue - posterior pharnyx;Pharyngeal residue - valleculae Pharyngeal -- Pharyngeal- Nectar Cup Delayed swallow initiation-pyriform sinuses;Reduced anterior laryngeal mobility;Reduced laryngeal elevation;Reduced airway/laryngeal closure;Reduced tongue base retraction;Pharyngeal residue - valleculae;Pharyngeal residue - posterior pharnyx;Penetration/Aspiration during swallow Pharyngeal Material enters airway, CONTACTS cords and then ejected out Pharyngeal- Nectar Straw Reduced anterior laryngeal mobility;Reduced laryngeal elevation;Reduced airway/laryngeal closure;Reduced tongue base retraction;Pharyngeal residue - valleculae;Pharyngeal residue - posterior pharnyx;Pharyngeal residue - pyriform Pharyngeal -- Pharyngeal- Thin Teaspoon Delayed swallow initiation-vallecula;Reduced anterior laryngeal mobility;Reduced laryngeal elevation;Reduced airway/laryngeal closure;Reduced tongue base retraction;Pharyngeal residue - valleculae;Pharyngeal residue - pyriform;Penetration/Aspiration during swallow Pharyngeal Material enters airway, CONTACTS cords and then ejected out Pharyngeal- Thin Cup Delayed swallow initiation-pyriform sinuses;Reduced anterior laryngeal mobility;Reduced laryngeal elevation;Reduced airway/laryngeal closure;Reduced tongue base retraction;Penetration/Aspiration during swallow;Pharyngeal residue -  valleculae;Pharyngeal residue - pyriform Pharyngeal Material enters airway, passes BELOW cords without attempt by patient to eject out (silent aspiration) Pharyngeal- Thin Straw -- Pharyngeal -- Pharyngeal- Puree Delayed swallow initiation-vallecula;Reduced anterior laryngeal mobility;Reduced laryngeal elevation;Reduced airway/laryngeal closure;Reduced tongue base retraction;Pharyngeal residue - valleculae;Pharyngeal residue - pyriform Pharyngeal -- Pharyngeal- Mechanical Soft -- Pharyngeal -- Pharyngeal- Regular -- Pharyngeal -- Pharyngeal- Multi-consistency -- Pharyngeal -- Pharyngeal- Pill -- Pharyngeal -- Pharyngeal Comment --  No flowsheet data found. No flowsheet data found. Page, [name] 06/29/2015, 3:21 PM               Billey Wojciak, DO  [facility] Pager [phone]  If 7PM-7AM, please contact night-coverage [email or web address] Password [email or web address] Jul 30, 2015, 7:18 AM   LOS: 23 days

## 2015-08-10 NOTE — Progress Notes (Signed)
PMT RN: Called back to room for apnea. Pt with labored respirations despite morphine. Very wet breathing. Pt is pale and fingertips are colorless and cold. Wife states that she sees rapid decline in pt. Obtained rx for atropine on top of scheduled robinul. Assessed morphine usage and obtained rx to start continuous infusion with [id number] prn clinician bolus. Discussed with SW and PA; d/c cancelled. Plan to keep pt until he stabilzes or dies.   PMT will follow daily and prn.  MMarjie SkiffHenslee, RN, BSN, CPl[facility]72017/07/213:02 PM Cell [phone]:00-4:00 Monday-Friday Office [phone]

## 2015-08-10 NOTE — Plan of Care (Signed)
Problem: RH BLADDER ELIMINATION Goal: RH STG MANAGE BLADDER WITH ASSISTANCE STG Manage Bladder With mod Assistance  Outcome: Not Met (add Reason) Change in patient's condition. Comfort/palliative care

## 2015-08-10 NOTE — Progress Notes (Addendum)
Subjective/Complaints: Pt laying in bed this AM.  He is lethargic, but responds to questions.  Later, approached by nursing stating pt unable to take oral meds this AM.    Review of systems: limited by dysarthria and ?aphasia, however, appears to deny CP, SOB, N/V/D.  Objective: Vital Signs: Blood pressure 138/66, pulse 98, temperature 99.3 F (37.4 C), temperature source Oral, resp. rate 18, weight 90.901 kg (200 lb 6.4 oz), SpO2 97 %. [name] Wo Contrast  07/12/2015  CLINICAL DATA:  Follow-up possible brain metastasis. EXAM: MRI HEAD WITHOUT AND WITH CONTRAST TECHNIQUE: Multiplanar, multiecho pulse sequences of the brain and surrounding structures were obtained without and with intravenous contrast. CONTRAST:  69m MULTIHANCE GADOBENATE DIMEGLUMINE 529 MG/ML IV SOLN COMPARISON:  Brain MRI 06/14/2015 and 06/26/2015 FINDINGS: Calvarium and upper cervical spine: No focal marrow signal abnormality. Orbits: Bilateral cataract resection. Sinuses and Mastoids: Persistent bilateral mastoid effusion. Sphenoid sinusitis has improved. Brain: Parenchymal hemorrhage subcortical posterior left frontal has contracted. There is progressive hemosiderin staining with rim of methemoglobin along the cavity wall. On 5:20 the hematoma measures 23 x 14 mm today. Small regional subarachnoid hemorrhage is diminished. Persistent restricted diffusion subcortical below the hematoma cavity. Cortical restricted diffusion has diminished from prior. Even when accounting for methemoglobin rim, there is enhancement around the hematoma cavity, expected, which obscures the nodular enhancement seen on the initial scan. There is new cortical enhancement along the previously seen acute infarct. A nodular area of frontal parietal cortex enhancement is stable in size compared to initial scan 06/14/2015, 10 mm in diameter on 11:43. Separate from this area, there is no evidence of new enhancement. Postcontrast and other sequences are  limited by motion, which could obscure subtle lesion. Stable lateral ventriculomegaly with prominent flow void and cerebral aqua duct. Still, communicating hydrocephalus is considered unlikely given the degree of generalized volume loss the lack third ventricular dilatation. No interval infarct.  No evidence of major vessel occlusion. IMPRESSION: 1. Progressive posterior left frontal enhancement related to infarct seen 06/26/2015. Persistent rim enhancement around the contracting posterior left frontal parenchymal hematoma which obscures the nodular enhancement seen on initial scan 06/14/2015. 10 mm cortical enhancement posterior to the hematoma is stable since initial scan. This expected subacute infarct enhancement and hematoma cavity enhancement could obscure metastatic enhancement. Given the evolution of findings, it is possible the enhancement on the initial scan was also subacute infarct, with superimposed hemorrhagic conversion. Continued imaging follow-up is recommended. Post infarct enhancement can persist for up to 3 months, but should be diminishing by 6 weeks. 2. Motion degraded exam without enhancement other than above. 3. Bilateral mastoiditis.  Improved sinusitis. Electronically Signed   By: JMonte FantasiaM.D.   On: 07/12/2015 14:54   Results for orders placed or performed during the hospital encounter of 07/01/2015 (from the past 72 hour(s))  Glucose, capillary     Status: Abnormal   Collection Time: 07/11/15 11:28 AM  Result Value Ref Range   Glucose-Capillary 125 (H) 65 - 99 mg/dL   Comment 1 Notify RN   Glucose, capillary     Status: Abnormal   Collection Time: 07/11/15  4:28 PM  Result Value Ref Range   Glucose-Capillary 159 (H) 65 - 99 mg/dL  Glucose, capillary     Status: Abnormal   Collection Time: 07/11/15  9:16 PM  Result Value Ref Range   Glucose-Capillary 107 (H) 65 - 99 mg/dL   Comment 1 Notify RN    Comment 2 Document in Chart  Glucose, capillary     Status: Abnormal    Collection Time: 07/12/15  6:49 AM  Result Value Ref Range   Glucose-Capillary 143 (H) 65 - 99 mg/dL  Glucose, capillary     Status: Abnormal   Collection Time: 07/12/15  4:44 PM  Result Value Ref Range   Glucose-Capillary 110 (H) 65 - 99 mg/dL  Glucose, capillary     Status: Abnormal   Collection Time: 07/12/15  9:21 PM  Result Value Ref Range   Glucose-Capillary 120 (H) 65 - 99 mg/dL  Glucose, capillary     Status: Abnormal   Collection Time: 07/13/15  6:40 AM  Result Value Ref Range   Glucose-Capillary 130 (H) 65 - 99 mg/dL  Troponin I (q 6hr x 3)     Status: None   Collection Time: 07/13/15  8:00 AM  Result Value Ref Range   Troponin I <0.03 <0.03 ng/mL  Glucose, capillary     Status: Abnormal   Collection Time: 07/13/15 11:24 AM  Result Value Ref Range   Glucose-Capillary 144 (H) 65 - 99 mg/dL   Comment 1 Notify RN   Troponin I (q 6hr x 3)     Status: None   Collection Time: 07/13/15 12:30 PM  Result Value Ref Range   Troponin I <0.03 <0.03 ng/mL  Lipase, blood     Status: None   Collection Time: 07/13/15 12:30 PM  Result Value Ref Range   Lipase 15 11 - 51 U/L  Glucose, capillary     Status: Abnormal   Collection Time: 07/13/15  4:28 PM  Result Value Ref Range   Glucose-Capillary 131 (H) 65 - 99 mg/dL   Comment 1 Notify RN   Troponin I (q 6hr x 3)     Status: None   Collection Time: 07/13/15  5:35 PM  Result Value Ref Range   Troponin I <0.03 <0.03 ng/mL  Glucose, capillary     Status: Abnormal   Collection Time: 07/13/15  9:05 PM  Result Value Ref Range   Glucose-Capillary 123 (H) 65 - 99 mg/dL  Glucose, capillary     Status: Abnormal   Collection Time: 2015-07-26  6:43 AM  Result Value Ref Range   Glucose-Capillary 151 (H) 65 - 99 mg/dL  CBC     Status: Abnormal   Collection Time: 07-26-15  9:00 AM  Result Value Ref Range   WBC 7.1 4.0 - 10.5 K/uL   RBC 3.14 (L) 4.22 - 5.81 MIL/uL   Hemoglobin 8.4 (L) 13.0 - 17.0 g/dL   HCT 27.5 (L) 39.0 - 52.0 %   MCV  87.6 78.0 - 100.0 fL   MCH 26.8 26.0 - 34.0 pg   MCHC 30.5 30.0 - 36.0 g/dL   RDW 16.5 (H) 11.5 - 15.5 %   Platelets 268 150 - 400 K/uL  Basic metabolic panel     Status: Abnormal   Collection Time: 07-26-2015  9:00 AM  Result Value Ref Range   Sodium 134 (L) 135 - 145 mmol/L   Potassium 4.1 3.5 - 5.1 mmol/L   Chloride 102 101 - 111 mmol/L   CO2 23 22 - 32 mmol/L   Glucose, Bld 151 (H) 65 - 99 mg/dL   BUN 10 6 - 20 mg/dL   Creatinine, Ser 1.14 0.61 - 1.24 mg/dL   Calcium 9.2 8.9 - 10.3 mg/dL   GFR calc non Af Amer >60 >60 mL/min   GFR calc Af Amer >60 >60 mL/min    Comment: (  NOTE) The eGFR has been calculated using the CKD EPI equation. This calculation has not been validated in all clinical situations. eGFR's persistently <60 mL/min signify possible Chronic Kidney Disease.    Anion gap 9 5 - 15    HEENT: Normocephalic. Atraumatic Cardio: irregular irregular Resp: Upper airway sounds. No wheezes. GI: BS positive and nontender nondistended Skin:   Intact. Warm and dry. Neuro: Flat.  Motor RUE/RLE: remains 0/5 MAS: left elbow flexion 1+/4, finger flexors 1/4 Moving LUE/LUE without difficulty Dysarthric and Aphasic Right facial weakness Musc/Skel:  No edema. No tenderness. Gen. no acute distress.  Vital signs reviewed.  Assessment/Plan: 1. 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 Physiatrist is providing close team supervision and 24 hour management of active medical problems listed below. Physiatrist and rehab team continue to assess barriers to discharge/monitor patient progress toward functional and medical goals. FIM: Function - Bathing Position: Bed Body parts bathed by patient: Chest, Abdomen, Right arm, Front perineal area Body parts bathed by helper: Back, Buttocks, Right upper leg, Left upper leg, Right lower leg, Left  lower leg Bathing not applicable: Right upper leg, Left upper leg, Right lower leg, Left lower leg Assist Level: 2 helpers  Function- Upper Body Dressing/Undressing What is the patient wearing?: Hospital gown Pull over shirt/dress - Perfomed by patient: Thread/unthread left sleeve, Put head through opening Pull over shirt/dress - Perfomed by helper: Thread/unthread right sleeve, Pull shirt over trunk Assist Level: 2 helpers Function - Lower Body Dressing/Undressing What is the patient wearing?: Pants, Non-skid slipper socks Position: Bed Pants- Performed by helper: Thread/unthread right pants leg, Thread/unthread left pants leg, Pull pants up/down Non-skid slipper socks- Performed by helper: Don/doff right sock, Don/doff left sock Assist for footwear: Dependant Assist for lower body dressing:  (Total assist)  Function - Toileting Toileting activity did not occur: No continent bowel/bladder event Assist level: Two helpers (per [employer], NT)  Function - Air cabin crew transfer activity did not occur: Safety/medical concerns Assist level to toilet: 2 helpers (per Berkley Harvey, NT) Assist level from toilet: 2 helpers  Function - Chair/bed transfer Chair/bed transfer activity did not occur: Safety/medical concerns Chair/bed transfer method: Lateral scoot Chair/bed transfer assist level: Maximal assist (Pt 25 - 49%/lift and lower) Chair/bed transfer assistive device: Sliding board, Bedrails Mechanical lift: Stedy Chair/bed transfer details: Verbal cues for precautions/safety, Verbal cues for sequencing, Verbal cues for technique  Function - Locomotion: Wheelchair Will patient use wheelchair at discharge?: Yes Type: Manual Max wheelchair distance: 10 Assist Level: Dependent (Pt equals 0%) Assist Level: Maximal assistance (Pt 25 - 49%) Wheel 150 feet activity did not occur: Safety/medical concerns Turns around,maneuvers to table,bed, and toilet,negotiates 3%  grade,maneuvers on rugs and over doorsills: No Function - Locomotion: Ambulation Ambulation activity did not occur: Safety/medical concerns Assistive device: Other (comment) (three muskateers) Max distance: 30 Assist level: 2 helpers Assist level: 2 helpers  Function - Comprehension Comprehension: Auditory Comprehension assistive device: Hearing aids Comprehension assist level: Understands basic 75 - 89% of the time/ requires cueing 10 - 24% of the time  Function - Expression Expression: Verbal Expression assist level: Expresses basic 50 - 74% of the time/requires cueing 25 - 49% of the time. Needs to repeat parts of sentences.  Function - Social Interaction Social Interaction assist level: Interacts appropriately 50 - 74% of the time - May be physically or verbally inappropriate.  Function -  Problem Solving Problem solving assist level: Solves basic 50 - 74% of the time/requires cueing 25 - 49% of the time  Function - Memory Memory assist level: Recognizes or recalls 50 - 74% of the time/requires cueing 25 - 49% of the time Patient normally able to recall (first 3 days only): That he or she is in a hospital, Staff names and faces    Medical Problem List and Plan: 1.  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   Cont CIR as tolerated.  Will speak with palliative care again today as it appears pt may be approaching end of life.    Repeat MRI reviewed, with progression of infarct  Orthotics for RUE  Palliative care recs appreciated, it appears pt and family have decided not to pursue any further interventions 2.  DVT Prophylaxis/Anticoagulation: SCDs. Monitor for signs of DVT, Xarelto should help with this as well 3. Pain Management: Ultram and oxycodone as needed 4. Mood: Xanax 0.25 mg 3 times daily as needed 5. Neuropsych: This patient is not capable of making decisions on his own behalf with caregiver/staff assistance. 6. Skin/Wound  Care: Routine skin checks 7. Fluids/Electrolytes/Nutrition: Routine I&O's   Severe swallowing difficulties, meal intake usually around 0-50% yesterday 8. Seizure prophylaxis. Keppra now at 1000 mg twice a day---more alert. EEG displayed seizure activity 9. Atrial fibrillation. Cardiac rate control. Continue amiodarone 200 mg daily, Cardizem 60 mg twice a day.Eliquis held secondary to [place], family does not want to resume, would be comfortable with Xarelto , Initiated low-dose due to stable hemoglobin 10. 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 This is likely the etiology of the persistent cough, Right lung nodules,As noted plan follow-up MRI after ICH absorbed for further evaluation of possible metastasis 11. Diabetes mellitus with peripheral neuropathy. Hemoglobin A1c 6.8. Glucophage 1000 mg at breakfast and 500 mg supper ,  Amaryl 1 mg daily. Check blood sugars before meals and at bedtime Generally well controlled on the current regimen CBG (last 3)   Recent Labs  07/13/15 1628 07/13/15 2105 2015-08-12 0643  GLUCAP 131* 123* 151*  12. Hyperlipidemia. Lipitor 13. UTI enterobacter cloacae- Sensitive to ceftriaxone. Started 1 g every 24 hours.  Afebrile , 7 day course completed.    14. Anemia:  Stool guaiac negative X 2,    As per internal medicine now on Xarelto. This was also Recommended by neurology.  Hemoglobin stable at 8.4 on 7/5 (stable).    Cont to monitor 15. CKD  Cr. 1.14 on 7/5   16.HTN  Labile  Will cont to monitor  LOS (Days) 23 A FACE TO FACE EVALUATION WAS PERFORMED  Ankit Lorie Phenix 08/12/15, 10:02 AM

## 2015-08-10 NOTE — Progress Notes (Addendum)
RN went into patients room to administer medication and family said pts breathing has changed. RN assess patients breathing and was agonal, and approximately 20sec apart. RN sat with wife and family and answered any questions. Pt calm. After approximately 2 min, respirations stopped and RN checked pulse. Charge RN, Candace Cruise came to the room to pronounce death at [birth year] 2004 with primary RN per MD order/protocol. Family at bedside. Family given time with patient. RN called [facility] (reference number [id number]: spoke to Guerry Bruin), notified [facility]. Marlowe [place], PA was notified by Scarlette Slice, dayshift primary RN, of patients expiration. Post mortem flow sheet initiated and post-mortem care completed. Wife and daughter sat with patient once more, and then left with patients belongings (gold wedding band, clothes/shoes, hearing aids, dentures, and glasses) Flowsheet completed. Bed placement notified.    Foley removed (35m from balloon), and port-a-cath de accessed.

## 2015-08-10 NOTE — Progress Notes (Signed)
Patient is unresponsive, agonal breathing noted. Patient fingers and toes are blue. However patient appears to be resting comfortably. Wife and daughter at bedside, explained the changes the patient may experience as his condition declines. [name] and daughter asked appropriate questions, and verbalize understanding of the explanation given. Will continue to monitor patient, and keep him comfortable.[place]

## 2015-08-10 NOTE — Progress Notes (Signed)
This am patient noted to be lethargic, opens eye when asked questions with an attempt made to verbalize. Patient with an incontinent episode Elizabeth,PT assisted with patient care. Patient unable to assist with turning in bed. With turning patient noted to be groaning in pain.  Attempts made to administer medications, patient too lethargic. Arsenio Katz, MD aware of this.  Spoke patient's wife on phone explained that he was unable to take medication this morning due to a decline, and taking care of him and making him as comfortable as possible. She verbalized understanding. Will continue to monitor patient.

## 2015-08-10 NOTE — Plan of Care (Signed)
Problem: RH BOWEL ELIMINATION Goal: RH STG MANAGE BOWEL WITH ASSISTANCE STG Manage Bowel with mod Assistance.  Outcome: Not Met (add Reason) Change in patient's condition. Comfort/palliative care given

## 2015-08-10 NOTE — Progress Notes (Signed)
Social Work Discharge Note  The overall goal for the admission was met for:   Discharge location: No - pt expired 2015-07-22   Length of Stay: Yes - 22 days  Discharge activity level: No - pt's condition declined after his stroke and he was not meeting goals  Home/community participation: No  Services provided included: MD, RD, PT, OT, SLP, RN, Pharmacy, [place]: Private Insurance: [employer] Medicare and Other: [employer]are  Follow-up services arranged: Other: Pt was referred [facility] [place] for home services and then for inpatient hospice.   Comments (or additional information):  Pt was to receive home hospice and then plan changed to inpt hospice with pt's rapid decline on July 22, 2015.  Pt continued to decline that day and Palliative Medicine Team decided pt was too unstable to transfer and he was kept on Rehab with comfort measures.  Pt expired at 2006 on 2015-07-22 with his wife and dtr at his bedside.  Rehab staff provided support to pt's family, as well.  Patient/Family verbalized understanding of follow-up arrangements: Yes, but pt expired prior to d/c.  Individual responsible for coordination of the follow-up plan: pt's wife  Confirmed correct DME delivered: Trey Sailors 07/15/2015    Ludwika Rodd, Silvestre Mesi

## 2015-08-10 NOTE — Discharge Summary (Signed)
Priority discharge summary job # [phone] [id number]

## 2015-08-10 NOTE — Progress Notes (Signed)
Occupational Therapy Note  Patient Details  Name: Ivan Beasley MRN: [id number] Date of Birth: 01/01/1941  Today's Date: 2015/08/05 OT Missed Time: 67 Minutes Missed Time Reason: Pain;Other (comment) (decreased arousal)  Pt missed 60 mins scheduled OT session secondary to pain and decreased arousal.  Pt moaning and groaning at rest in bed, unable to maintain eyes open to engage in any activity even at bed level.  Will continue to follow as able for family ed in preparation for d/c.  [name] 2015-08-05, 10:39 AM

## 2015-08-10 NOTE — Progress Notes (Signed)
SLP Cancellation Note  Patient Details Name: Ivan Beasley MRN: [id number] DOB: 24-Nov-1940   Cancelled treatment:        Patient scheduled for 60 minutes of skilled SLP services; however, per chart review and given medical decline SLP orders have been discontinued.  SLP asked spouse if she had questions prior to discharge; she reported none.  SLP will sign off at this time.                                                                                         Gunnar Fusi, M.A., CCC-SLP [phone]   [name] 08-06-2015, 12:39 PM

## 2015-08-10 NOTE — Progress Notes (Signed)
Physical Therapy Session Note  Patient Details  Name: Ivan Beasley MRN: [id number] Date of Birth: [birth year]  Today's Date: August 11, 2015  Pt missed 30 minutes scheduled PT 2/2 decreased arousal, medical decline.  General: PT Amount of Missed Time (min): 30 Minutes PT Missed Treatment Reason: Other (Comment) (medical; decreased arousal)   See Function Navigator for Current Functional Status.   Therapy/Group: Individual Therapy  Waunita Schooner 08/11/15, 8:01 AM

## 2015-08-10 NOTE — Progress Notes (Signed)
Physical Therapy Discharge Summary  Patient Details  Name: Ivan Beasley MRN: [id number] Date of Birth: 11/01/1940   Patient met no long term goals due to change in medical status, however prior to 7/4 pt was making progress towards goals of mod-max assist and family education with ultimate goal to return home was occurring. Pt has had a significant medical decline and is discharging to palliative care to provide comfort care at this time. All family education has been completed with pt's wife who is understanding of pt's current status and agree with current plan of care.  Reasons goals not met: Pt with recent decline in medical status, discharging to palliative care.  Recommendation:  Pt will not benefit from additional therapy, recommend comfort care.   Equipment: No equipment provided  Reasons for discharge: discharge from hospital  Patient/family agrees with progress made and goals achieved: Yes   Luberta Mutter 2015/08/03, 4:43 PM

## 2015-08-10 NOTE — Progress Notes (Signed)
Pt was picked up by [facility] at 2345.

## 2015-08-10 NOTE — Discharge Summary (Signed)
Discharge summary job # [id number]

## 2015-08-10 NOTE — Progress Notes (Signed)
Physical Therapy Session Note  Patient Details  Name: Ivan Beasley MRN: [id number] Date of Birth: 03/30/1940  Today's Date: July 22, 2015 PT Individual Time: 0800-0815 PT Individual Time Calculation (min): 15 min   Short Term Goals: Week 3:  PT Short Term Goal 1 (Week 3): =LTG due to estimated length of stay  Skilled Therapeutic Interventions/Progress Updates:    Pt received semi-reclined in bed with RN present; pt very lethargic and with decreased arousal. Pt able to turn head toward auditory stimulus, verbalized once during sessions when cued to, otherwise no spontaneous volcalizations, difficulty keeping eyes open. Performed rolling R/L with maxA and minimal pt initiation to assist while therapist and RN performed hygiene and changing brief totalA. Pt with significant pain noted through facial expressions, grimacing while rolling. Scooted pt toward [facility] with +2A. Remainder of treatment session deferred d/t pain, lethargy. Will follow up as able and continue per POC to prepare for d/c home with family with consideration given to declining status and increased burden of care.  Therapy Documentation Precautions:  Precautions Precautions: Fall Precaution Comments: R hemi, pt on home O2 PTA for recent pneumonia (but used it PRN, not 24/7) Restrictions Weight Bearing Restrictions: No General: PT Amount of Missed Time (min): 45 Minutes PT Missed Treatment Reason: Patient fatigue   See Function Navigator for Current Functional Status.   Therapy/Group: Individual Therapy  Luberta Mutter 07/22/2015, 9:01 AM

## 2015-08-10 NOTE — Plan of Care (Signed)
Problem: RH BOWEL ELIMINATION Goal: RH STG MANAGE BOWEL W/MEDICATION W/ASSISTANCE STG Manage Bowel with Medication with mod Assistance.  Outcome: Not Met (add Reason) Change in patient's condition. Comfort/palliative care given

## 2015-08-10 NOTE — Plan of Care (Signed)
Problem: RH KNOWLEDGE DEFICIT Goal: RH STG INCREASE KNOWLEDGE OF DIABETES Patient and patient's caregiver able to verbalize understanding s/s of hypo/hyperglycemia, and able to demonstrate understanding understanding of medications, treatment, and food prescribed by MD with minimal assistance.  Outcome: Not Met (add Reason) Change in patient's condition. Comfort/palliative care

## 2015-08-10 DEATH — deceased

## 2015-12-18 ENCOUNTER — Other Ambulatory Visit: Payer: Self-pay | Admitting: Nurse Practitioner

## 2017-03-03 IMAGING — MR MR HEAD WO/W CM
9 of 12 series · 32 of 48 positions shown · IV contrast (Yes   MULTIHANCE)
Comparison: Prior CT from earlier the same day as well as previous
MRI from 06/14/2015.

CLINICAL DATA: Initial evaluation for progressively garbled and
slurred speech, with increased right-sided facial droop.

EXAM:
MRI HEAD WITHOUT AND WITH CONTRAST
TECHNIQUE: Multiplanar, multiecho pulse sequences of the brain and surrounding
structures were obtained without and with intravenous contrast.
CONTRAST:  19mL MULTIHANCE GADOBENATE DIMEGLUMINE 529 MG/ML IV SOLN

[Series 3: T1 · sagittal · 5.0mm · 0.47mm/px · 2 of 27 slices shown]
[im 1/27]
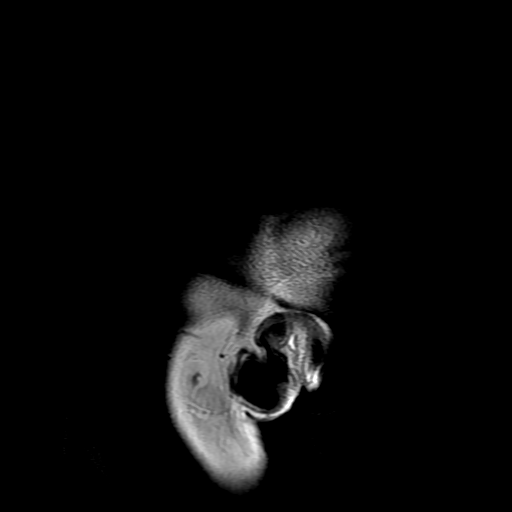
[im 14/27]
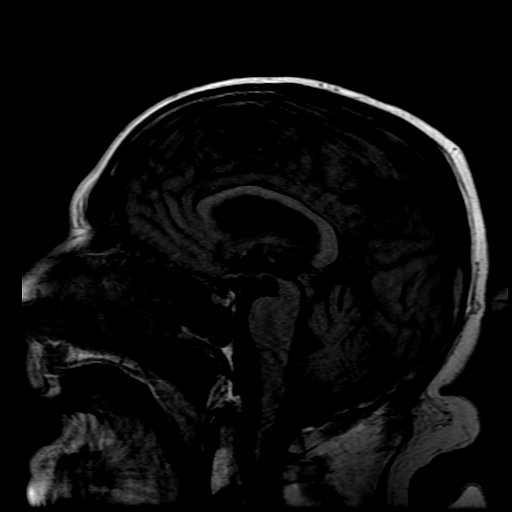

[Series 4: DWI · axial · 3.0mm · 1.09mm/px · z∈[-16,+119]mm · 9 of 102 slices shown (1 of 4)]
[im 1/102]
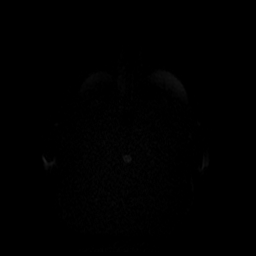
[im 13/102]
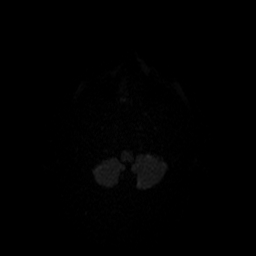
[im 26/102]
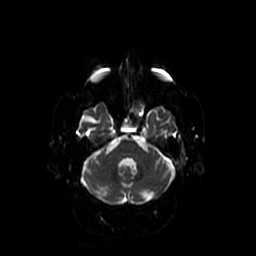
[im 38/102]
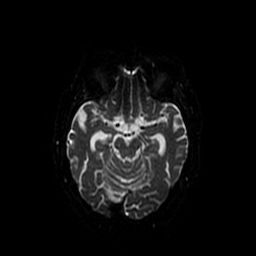
[im 51/102]
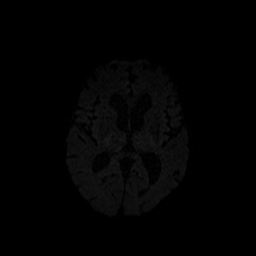
[im 64/102]
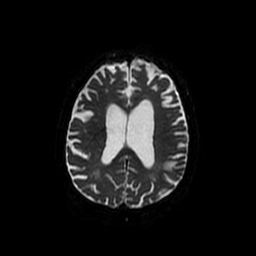
[im 76/102]
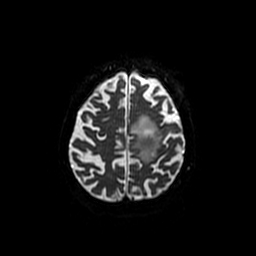
[im 89/102]
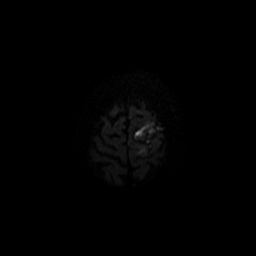
[im 102/102]
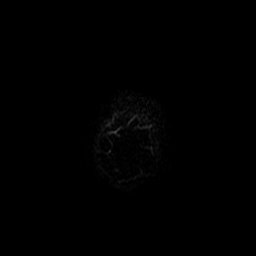

[Series 5: DWI · coronal · 5.0mm · 1.09mm/px · 6 of 66 slices shown (2 of 4)]
[im 1/66]
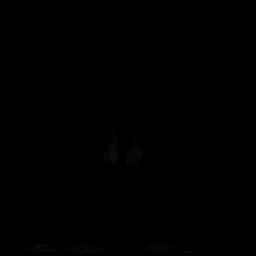
[im 14/66]
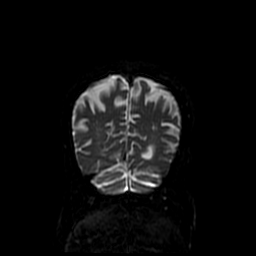
[im 27/66]
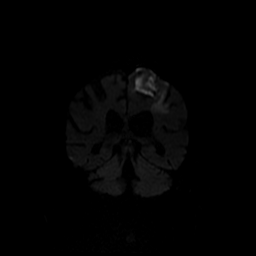
[im 40/66]
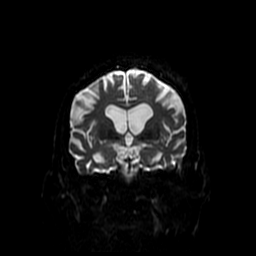
[im 53/66]
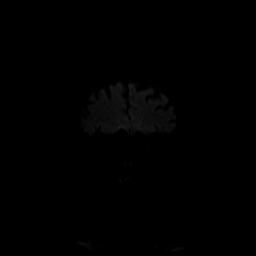
[im 66/66]
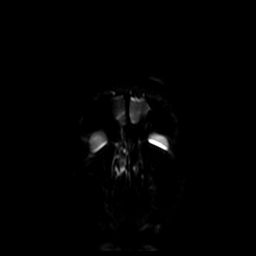

[Series 6: T2 · axial · 5.0mm · 0.43mm/px · z∈[-25,+111]mm · 2 of 26 slices shown (1 of 2)]
[im 1/26]
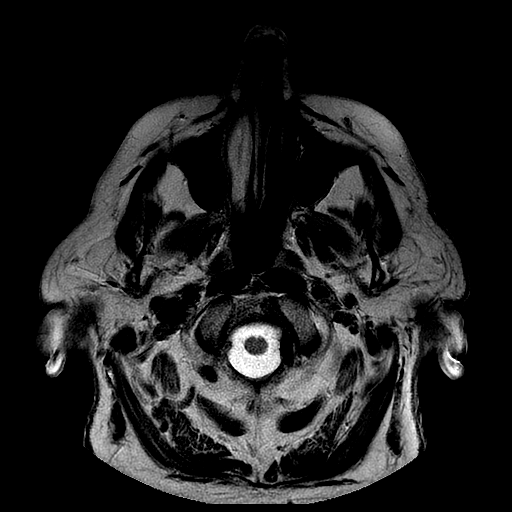
[im 26/26]
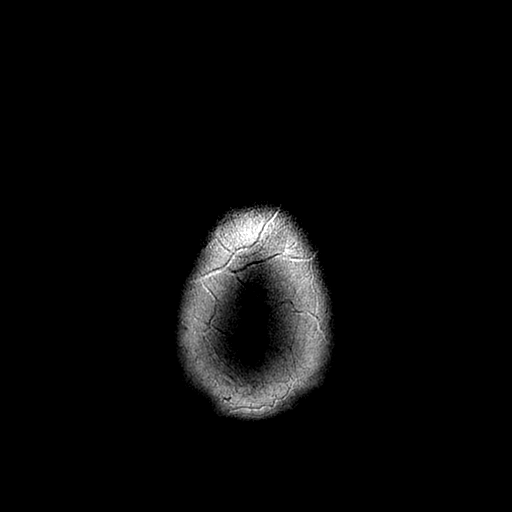

[Series 7: FLAIR · axial · 5.0mm · 0.43mm/px · z∈[-25,+111]mm · 2 of 26 slices shown]
[im 1/26]
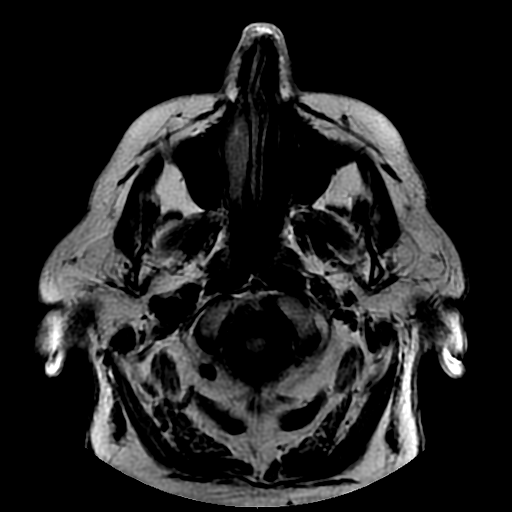
[im 26/26]
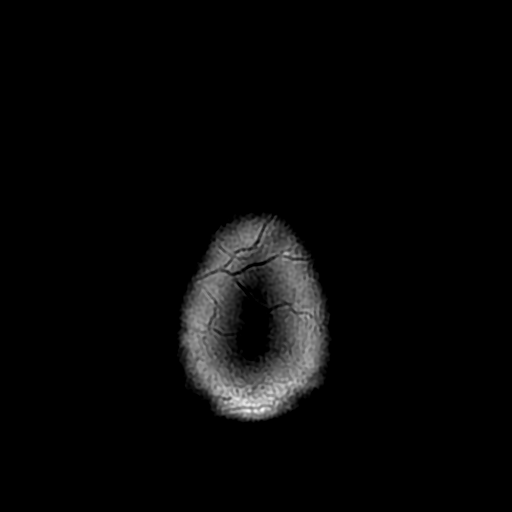

[Series 11: T2 · coronal · 5.0mm · 0.43mm/px · 2 of 28 slices shown (2 of 2)]
[im 1/28]
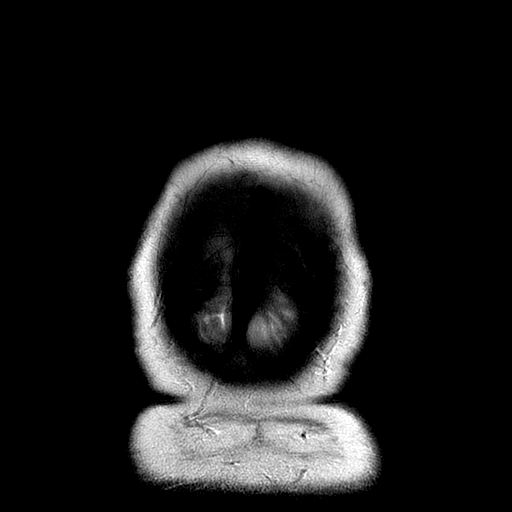
[im 28/28]
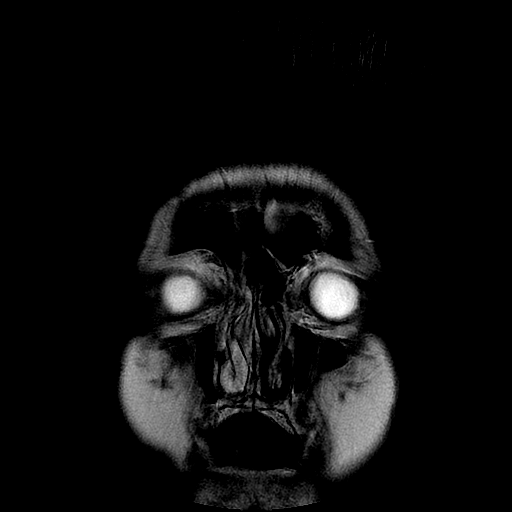

[Series 15: T1 post-contrast · coronal · 5.0mm · 0.43mm/px · 2 of 26 slices shown]
[im 1/26]
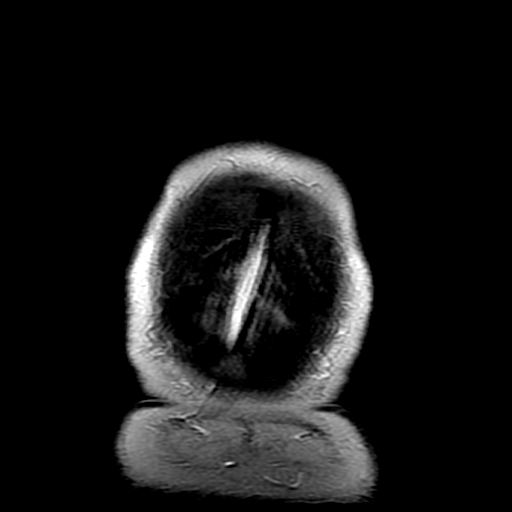
[im 26/26]
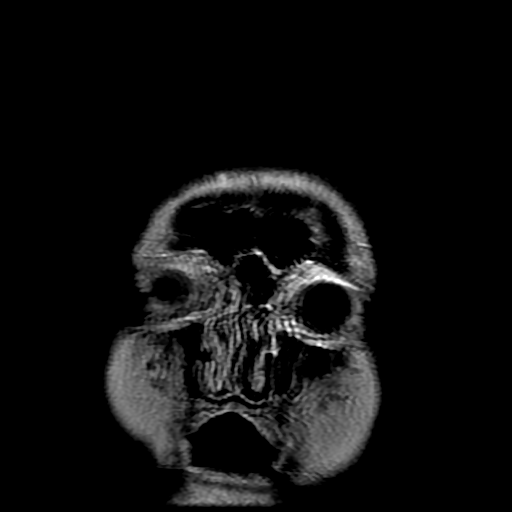

[Series 400: DWI · axial · 3.0mm · 1.09mm/px · z∈[-16,+119]mm · 4 of 51 slices shown (3 of 4)]
[im 1/51]
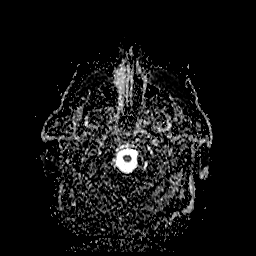
[im 17/51]
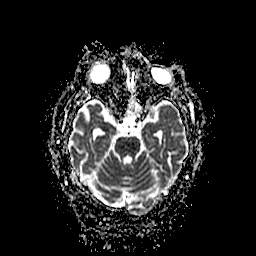
[im 34/51]
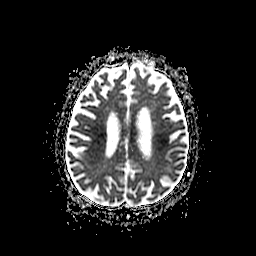
[im 51/51]
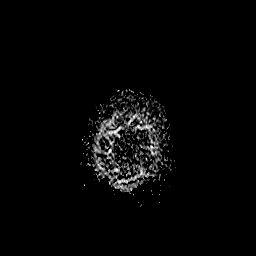

[Series 500: DWI · coronal · 5.0mm · 1.09mm/px · 3 of 33 slices shown (4 of 4)]
[im 1/33]
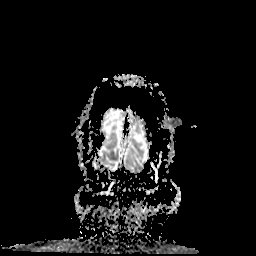
[im 17/33]
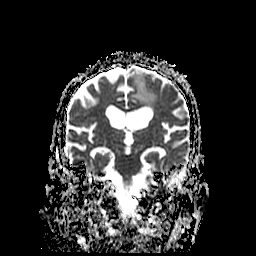
[im 33/33]
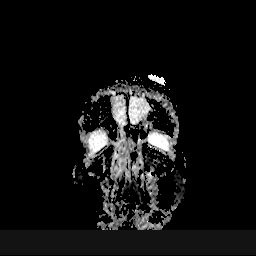

[32 of 48 positions shown; findings below may reference images not displayed]

FINDINGS: Generalized cerebral atrophy with minimal chronic small vessel
ischemic changes again noted, stable. Small remote lacunar infarct
within the right thalamus.

Previously identified posterior left frontal lobe complex
hemorrhagic lesion again seen, overall likely decreased in size from
previous. Lesion measures 13 x 21 x 16 mm on T2 weighted sequences.
Surrounding vasogenic edema is similar. Breakthrough subarachnoid
hemorrhage within the adjacent cortical sulci again seen, improved.
There is increased peripheral enhancement about the margin of the
hemorrhagic lesion, likely related to the evolving hematoma.
Previously noted small hemorrhagic nodule is not as clearly
delineated on this exam.

Note again made of a second possible enhancing lesion within the
anterior left parietal lobe along the dura, measuring approximately
9 x 11 x 11 mm. Surrounding vasogenic edema. No other new enhancing
lesions.

Immediately lateral and inferior to the dominant hemorrhagic lesion.
There is a focus of abnormal restricted diffusion involving the
cortical gray matter and underlying white matter of the posterior
left frontal region, compatible with acute ischemic infarct (series
4, image 35). Associated signal loss on ADC map. This appears to be
more vascular distribution rather than seizure related. Additional
small sub cm focus of restricted diffusion more posteriorly within
the left centrum semi ovale. No significant mass effect. Area of
infarction closely approximates the hemorrhagic lesion can
subarachnoid hemorrhage, but does not appear to be hemorrhagic in of
itself.

No other acute infarct. Gray-white matter differentiation otherwise
maintained. Major intracranial vascular flow voids are preserved.

No midline shift. No hydrocephalus. No extra-axial fluid collection.
Again, major dural sinuses appear to be grossly patent.

Craniocervical junction within normal limits. Visualized upper
cervical spine unremarkable.

Pituitary gland within normal limits. No acute abnormality about the
globes and orbits. Patient is status post lens extraction
bilaterally.

Mucosal thickening with fluid level within the left sphenoid sinus,
similar to prior. Mild scattered opacity within the anterior
ethmoidal air cells. Bilateral mastoid effusions present.

Visualize bone marrow within normal limits. Scalp soft tissues
demonstrate no acute abnormality.
IMPRESSION: 1. Acute ischemic left MCA territory infarct adjacent to the
posterior left frontal hemorrhagic lesion. Vasospasm as an
underlying etiology is suspected given the adjacent complex
hemorrhagic lesion and subarachnoid hemorrhage.
2. Continued interval evolution of complex left frontal lobe
hemorrhagic lesion with similar vasogenic edema. Associated
breakthrough subarachnoid hemorrhage is improved. This lesion now
demonstrates peripheral enhancement, which would be expected with an
evolving hematoma. Previously noted small nodular focus of
enhancement not well delineated on this exam. Again, continued MR
surveillance at the hemorrhage clears is recommended to ensure no
underlying lesion is present.
3. Additional 9 x 11 x 11 mm enhancing lesion within the anterior
left parietal lobe, similar to prior. Attention at follow-up
recommended.
4. Acute left sphenoid sinus disease with bilateral mastoid
effusions, similar to prior.

## 2017-03-06 IMAGING — RF DG SWALLOWING FUNCTION - [facility]
1 series · 18 of 24 positions shown · non-contrast
Comparison: none

[Series 1: run · 32 acquisitions, 18 frames shown]
[im 1/32]
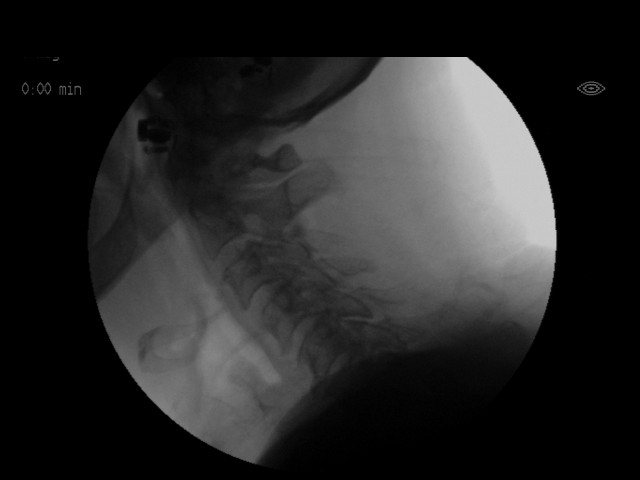
[im 3/32]
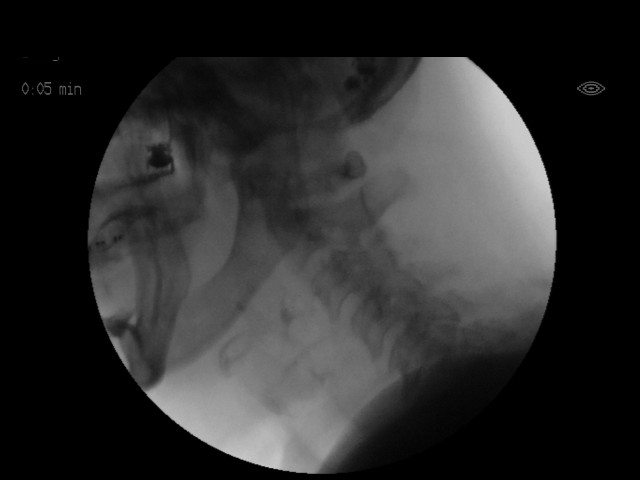
[im 5/32]
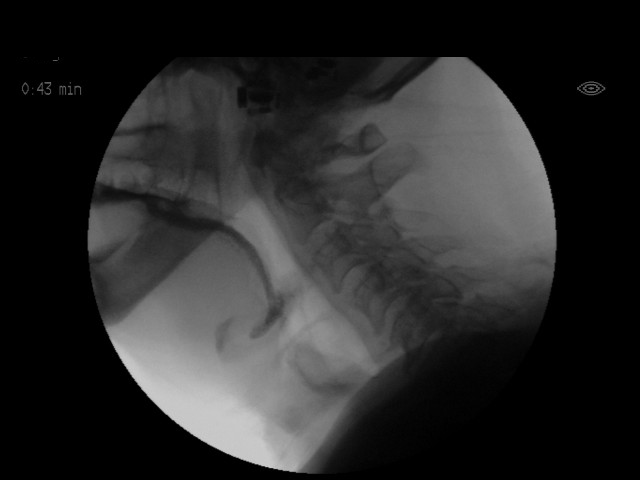
[im 6/32]
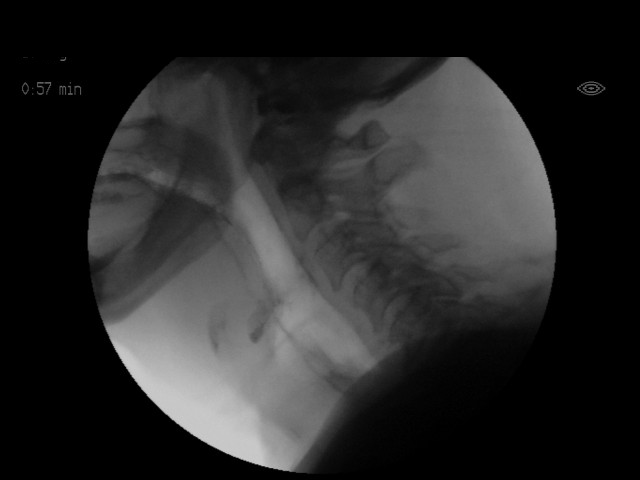
[im 9/32]
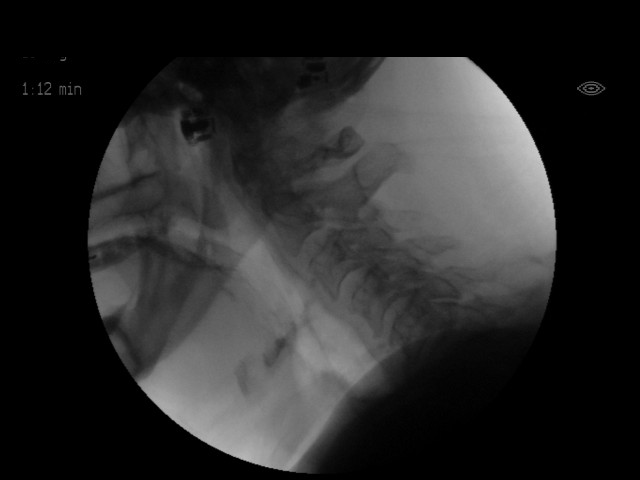
[im 10/32]
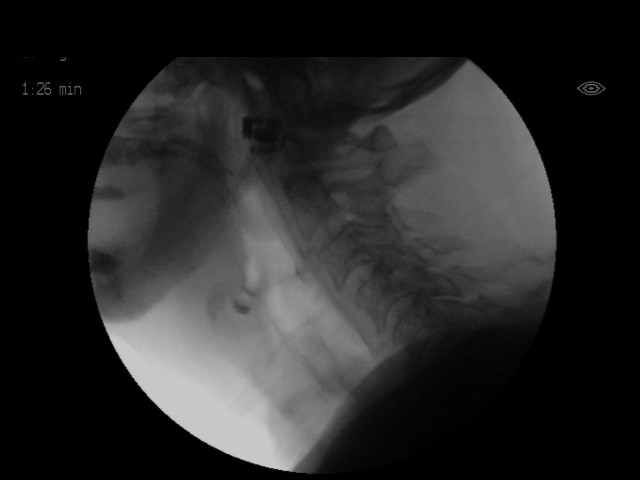
[im 11/32]
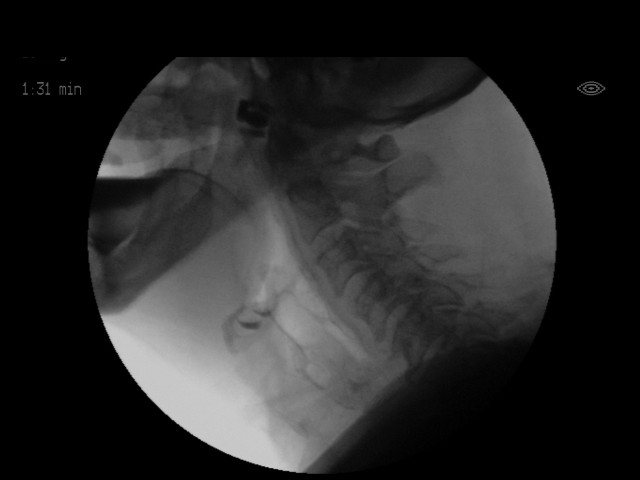
[im 14/32]
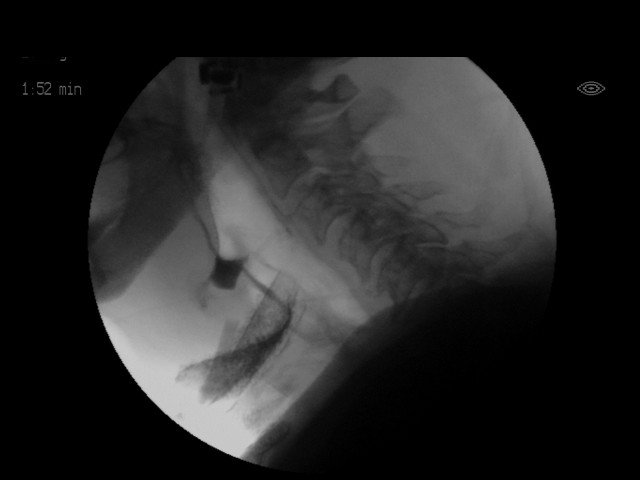
[im 15/32]
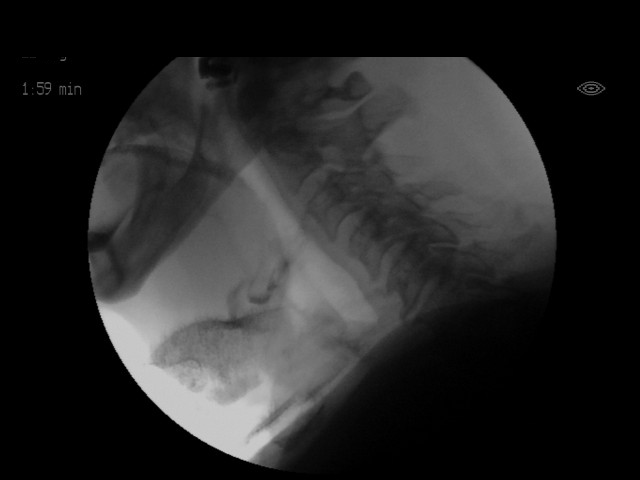
[im 17/32]
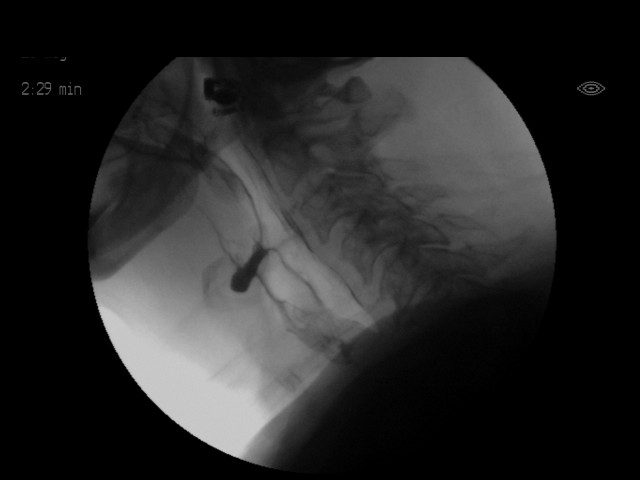
[im 19/32]
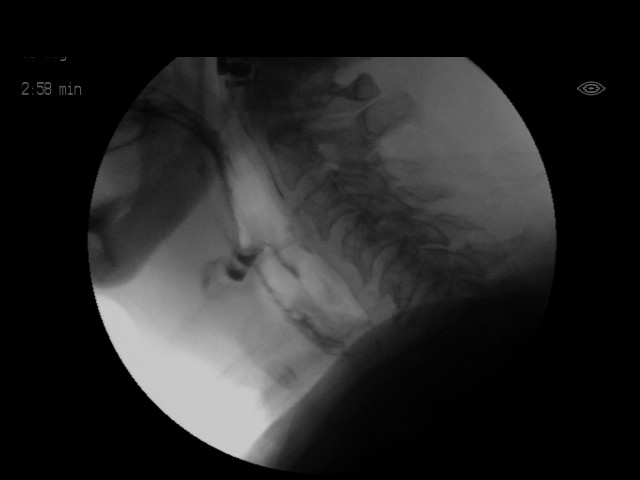
[im 21/32]
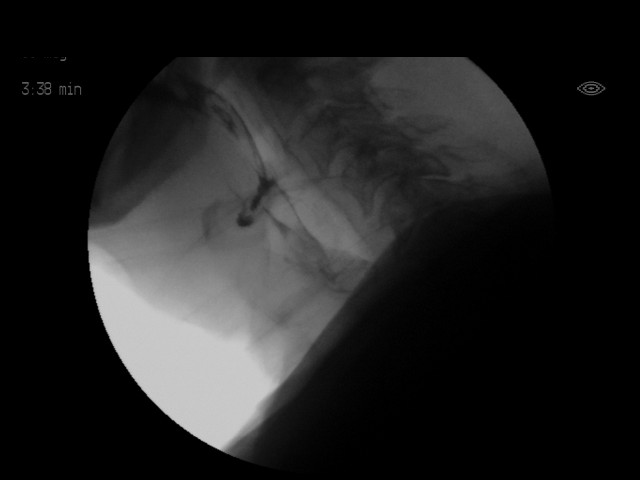
[im 22/32]
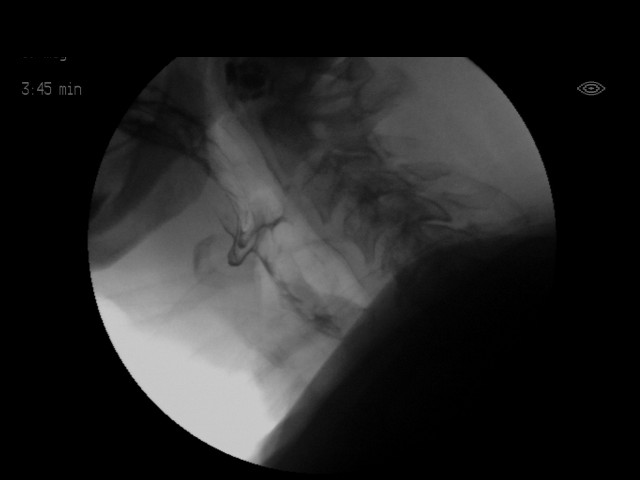
[im 25/32]
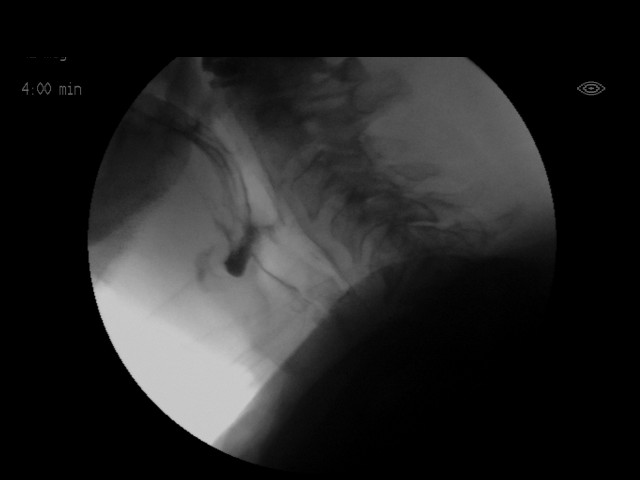
[im 26/32]
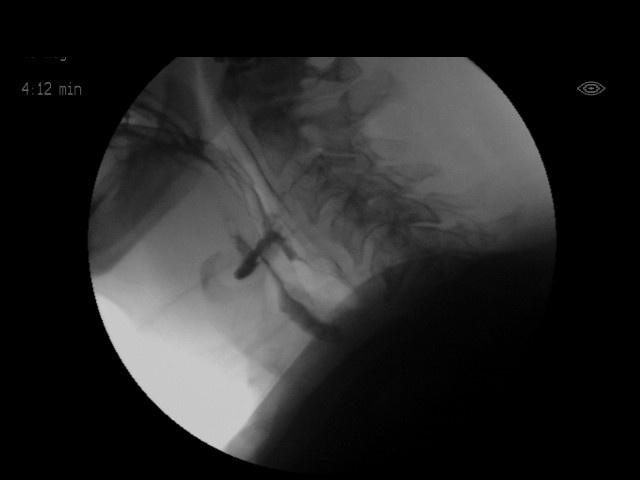
[im 27/32]
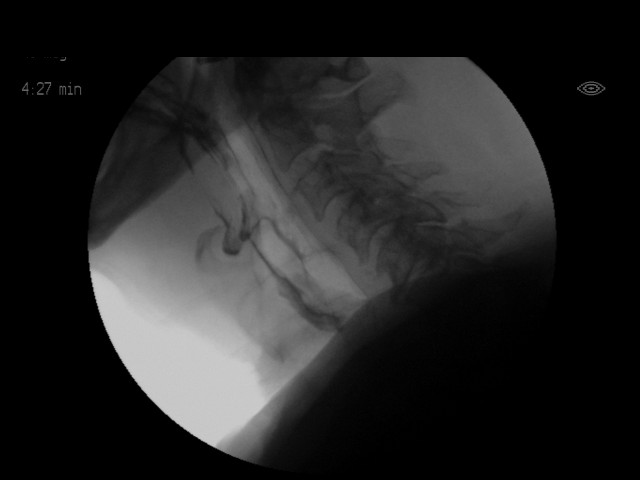
[im 30/32]
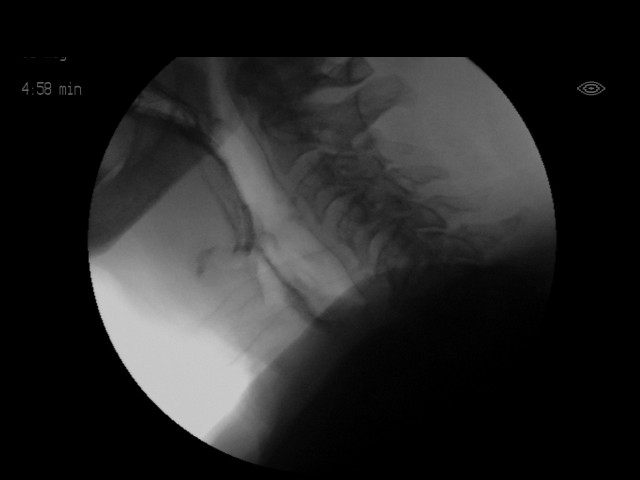
[im 32/32]
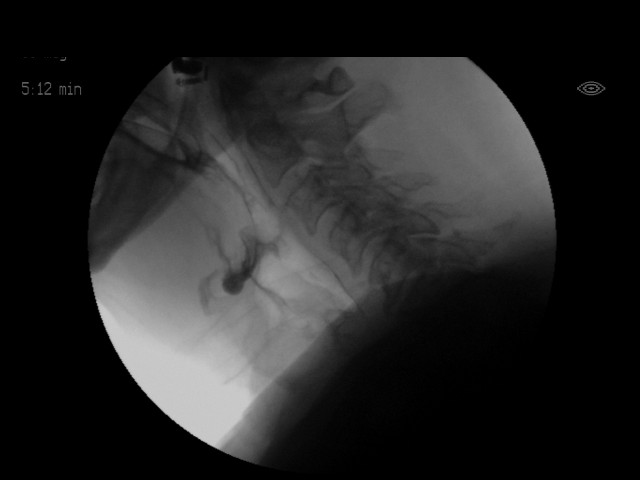

[18 of 24 positions shown; findings below may reference images not displayed]

FLUOROSCOPY FOR SWALLOWING FUNCTION STUDY:
Fluoroscopy was provided for swallowing function study, which was administered by a speech pathologist.  Final results and recommendations from this study are contained within the speech pathology report.
